# Patient Record
Sex: Female | Born: 1940 | Race: White | Hispanic: No | Marital: Married | Smoking: Former smoker
Health system: Southern US, Community
[De-identification: ages and names within clinical notes are randomized; demographics above are authoritative.]

## PROBLEM LIST (undated history)

## (undated) DIAGNOSIS — E785 Hyperlipidemia, unspecified: Secondary | ICD-10-CM

## (undated) DIAGNOSIS — K219 Gastro-esophageal reflux disease without esophagitis: Secondary | ICD-10-CM

## (undated) DIAGNOSIS — C541 Malignant neoplasm of endometrium: Secondary | ICD-10-CM

## (undated) DIAGNOSIS — J849 Interstitial pulmonary disease, unspecified: Secondary | ICD-10-CM

## (undated) DIAGNOSIS — G629 Polyneuropathy, unspecified: Secondary | ICD-10-CM

## (undated) DIAGNOSIS — R42 Dizziness and giddiness: Secondary | ICD-10-CM

## (undated) DIAGNOSIS — I499 Cardiac arrhythmia, unspecified: Secondary | ICD-10-CM

## (undated) DIAGNOSIS — I10 Essential (primary) hypertension: Secondary | ICD-10-CM

## (undated) DIAGNOSIS — G4730 Sleep apnea, unspecified: Secondary | ICD-10-CM

## (undated) DIAGNOSIS — F329 Major depressive disorder, single episode, unspecified: Secondary | ICD-10-CM

## (undated) DIAGNOSIS — F32A Depression, unspecified: Secondary | ICD-10-CM

## (undated) DIAGNOSIS — N183 Chronic kidney disease, stage 3 (moderate): Secondary | ICD-10-CM

## (undated) DIAGNOSIS — E7800 Pure hypercholesterolemia, unspecified: Secondary | ICD-10-CM

## (undated) DIAGNOSIS — M1990 Unspecified osteoarthritis, unspecified site: Secondary | ICD-10-CM

## (undated) DIAGNOSIS — K429 Umbilical hernia without obstruction or gangrene: Secondary | ICD-10-CM

## (undated) DIAGNOSIS — W19XXXA Unspecified fall, initial encounter: Secondary | ICD-10-CM

## (undated) DIAGNOSIS — IMO0001 Reserved for inherently not codable concepts without codable children: Secondary | ICD-10-CM

## (undated) HISTORY — DX: Unspecified fall, initial encounter: W19.XXXA

## (undated) HISTORY — PX: HERNIA REPAIR: SHX51

## (undated) HISTORY — DX: Hyperlipidemia, unspecified: E78.5

## (undated) HISTORY — PX: HAMMER TOE SURGERY: SHX385

## (undated) HISTORY — DX: Gastro-esophageal reflux disease without esophagitis: K21.9

## (undated) HISTORY — DX: Chronic kidney disease, stage 3 (moderate): N18.3

## (undated) HISTORY — PX: KNEE ARTHROSCOPY: SUR90

## (undated) HISTORY — PX: ABDOMINAL HYSTERECTOMY: SHX81

## (undated) HISTORY — PX: TONSILLECTOMY: SUR1361

## (undated) HISTORY — PX: CHOLECYSTECTOMY: SHX55

## (undated) HISTORY — DX: Malignant neoplasm of endometrium: C54.1

---

## 1988-06-28 DIAGNOSIS — Z8542 Personal history of malignant neoplasm of other parts of uterus: Secondary | ICD-10-CM | POA: Insufficient documentation

## 1996-06-28 DIAGNOSIS — Z87891 Personal history of nicotine dependence: Secondary | ICD-10-CM | POA: Insufficient documentation

## 1999-06-29 DIAGNOSIS — Z7901 Long term (current) use of anticoagulants: Secondary | ICD-10-CM | POA: Insufficient documentation

## 1999-06-29 DIAGNOSIS — I5022 Chronic systolic (congestive) heart failure: Secondary | ICD-10-CM | POA: Insufficient documentation

## 1999-06-29 DIAGNOSIS — I130 Hypertensive heart and chronic kidney disease with heart failure and stage 1 through stage 4 chronic kidney disease, or unspecified chronic kidney disease: Secondary | ICD-10-CM | POA: Insufficient documentation

## 1999-06-29 DIAGNOSIS — K429 Umbilical hernia without obstruction or gangrene: Secondary | ICD-10-CM | POA: Insufficient documentation

## 1999-06-29 DIAGNOSIS — R7303 Prediabetes: Secondary | ICD-10-CM | POA: Insufficient documentation

## 1999-06-29 DIAGNOSIS — G63 Polyneuropathy in diseases classified elsewhere: Secondary | ICD-10-CM | POA: Insufficient documentation

## 1999-06-29 DIAGNOSIS — Z6839 Body mass index (BMI) 39.0-39.9, adult: Secondary | ICD-10-CM | POA: Insufficient documentation

## 1999-06-29 DIAGNOSIS — E7800 Pure hypercholesterolemia, unspecified: Secondary | ICD-10-CM | POA: Insufficient documentation

## 1999-06-29 DIAGNOSIS — Z9981 Dependence on supplemental oxygen: Secondary | ICD-10-CM | POA: Insufficient documentation

## 2000-05-30 ENCOUNTER — Other Ambulatory Visit: Admission: RE | Admit: 2000-05-30 | Discharge: 2000-05-30 | Payer: Self-pay | Admitting: Obstetrics and Gynecology

## 2000-06-09 ENCOUNTER — Encounter: Admission: RE | Admit: 2000-06-09 | Discharge: 2000-06-09 | Payer: Self-pay | Admitting: Obstetrics and Gynecology

## 2000-06-09 ENCOUNTER — Encounter: Payer: Self-pay | Admitting: Obstetrics and Gynecology

## 2001-06-14 ENCOUNTER — Other Ambulatory Visit: Admission: RE | Admit: 2001-06-14 | Discharge: 2001-06-14 | Payer: Self-pay | Admitting: Obstetrics and Gynecology

## 2002-12-28 ENCOUNTER — Encounter: Payer: Self-pay | Admitting: Surgery

## 2002-12-28 ENCOUNTER — Encounter (INDEPENDENT_AMBULATORY_CARE_PROVIDER_SITE_OTHER): Payer: Self-pay | Admitting: Specialist

## 2002-12-28 ENCOUNTER — Inpatient Hospital Stay (HOSPITAL_COMMUNITY): Admission: EM | Admit: 2002-12-28 | Discharge: 2002-12-29 | Payer: Self-pay | Admitting: Emergency Medicine

## 2002-12-28 ENCOUNTER — Encounter: Payer: Self-pay | Admitting: Anesthesiology

## 2004-10-05 ENCOUNTER — Emergency Department (HOSPITAL_COMMUNITY): Admission: EM | Admit: 2004-10-05 | Discharge: 2004-10-06 | Payer: Self-pay | Admitting: Emergency Medicine

## 2004-10-12 ENCOUNTER — Encounter: Admission: RE | Admit: 2004-10-12 | Discharge: 2004-10-12 | Payer: Self-pay | Admitting: Obstetrics and Gynecology

## 2004-10-16 ENCOUNTER — Encounter: Admission: RE | Admit: 2004-10-16 | Discharge: 2004-10-16 | Payer: Self-pay | Admitting: Obstetrics and Gynecology

## 2004-10-16 ENCOUNTER — Encounter (INDEPENDENT_AMBULATORY_CARE_PROVIDER_SITE_OTHER): Payer: Self-pay | Admitting: *Deleted

## 2005-07-25 ENCOUNTER — Encounter: Admission: RE | Admit: 2005-07-25 | Discharge: 2005-07-25 | Payer: Self-pay | Admitting: Family Medicine

## 2007-01-26 ENCOUNTER — Encounter: Admission: RE | Admit: 2007-01-26 | Discharge: 2007-01-26 | Payer: Self-pay | Admitting: Family Medicine

## 2007-02-28 ENCOUNTER — Encounter: Admission: RE | Admit: 2007-02-28 | Discharge: 2007-02-28 | Payer: Self-pay | Admitting: Orthopedic Surgery

## 2007-06-01 ENCOUNTER — Ambulatory Visit (HOSPITAL_BASED_OUTPATIENT_CLINIC_OR_DEPARTMENT_OTHER): Admission: RE | Admit: 2007-06-01 | Discharge: 2007-06-01 | Payer: Self-pay | Admitting: Orthopedic Surgery

## 2008-01-29 ENCOUNTER — Encounter: Admission: RE | Admit: 2008-01-29 | Discharge: 2008-01-29 | Payer: Self-pay | Admitting: Family Medicine

## 2008-05-13 ENCOUNTER — Emergency Department (HOSPITAL_COMMUNITY): Admission: EM | Admit: 2008-05-13 | Discharge: 2008-05-13 | Payer: Self-pay | Admitting: Emergency Medicine

## 2008-05-30 ENCOUNTER — Ambulatory Visit: Payer: Self-pay | Admitting: Cardiovascular Disease

## 2008-06-06 ENCOUNTER — Ambulatory Visit: Payer: Self-pay

## 2008-10-28 ENCOUNTER — Encounter: Payer: Self-pay | Admitting: Orthopedic Surgery

## 2008-11-26 ENCOUNTER — Encounter: Payer: Self-pay | Admitting: Orthopedic Surgery

## 2008-12-26 ENCOUNTER — Encounter: Payer: Self-pay | Admitting: Orthopedic Surgery

## 2009-01-26 ENCOUNTER — Encounter: Payer: Self-pay | Admitting: Orthopedic Surgery

## 2010-11-10 NOTE — Assessment & Plan Note (Signed)
[facility]                            CARDIOLOGY OFFICE NOTE   NAME:ELLISAanshi, Batchelder                           MRN:          [id number]  DATE:05/30/2008                            DOB:          [birth year]    A 70 year old patient referred by Dr. Dorothe Pea in the [facility] ER for  chest pain.  The patient went to the ER on May 13, 2008, for chest  pain.  The pain is atypical.  It was nonexertional, it was sharp, and it  was in her chest radiated to her neck and arms.  She had it for about 10  days.  It is slowly subsiding.  She was ruled out for myocardial  infarction, had no high-risk features, and was sent home.  Talking to  the patient, her coronary risk factors include hypertension and  hypercholesterolemia.  Family history with mother having congestive  heart failure.   The patient's activities are limited.  She has had multiple surgeries on  both knees by Dr. Simonne Come and does not get around well.  She seems to  think her pain is improving slightly; however, she continues to have  some around the shoulder blades and some that radiated up towards the  left neck.  They are not associated with diaphoresis or palpitations.  She does get some dizziness; however, this sounds more like an inner ear  problem, it particularly happens at night when she lays her head down.  There is a ringing in the right ear as well.   REVIEW OF SYSTEMS:  Otherwise, negative.   PAST MEDICAL HISTORY:  Remarkable for hypertension and  hypercholesterolemia.  She has had some chest pain and had a stress test  about 30 years ago.   She is status post cholecystectomy, status post bilateral knee  surgeries, hernia repair x2, tonsillectomy, and has had some surgery on  her toes.  She has significant arthritis.   The patient is happily married.  She has 3 children.  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  She is fairly sedentary and  likes to be on the  Internet quite a bit.  She quit smoking 20 or 25  years ago.  She does not drink alcohol.   FAMILY HISTORY:  Remarkable for mother dying at age 66 with congestive  heart failure.  Father died at age 25 of old age.   CURRENT MEDICATION:  1. Crestor 10 mg daily.  2. Lasix 80 a day.  3. Amlodipine 10 a day.  4. Diovan 320 a day.  5. Meloxicam 15 a day.   PHYSICAL EXAMINATION:  GENERAL:  Remarkable for an obese white female in  no distress.  Pulses are 81 and regular, blood pressure is 130/80, and  weight is 255.  HEENT:  Unremarkable.  NECK:  Carotids are normal without bruit.  No lymphadenopathy,  thyromegaly, or JVP elevation.  LUNGS:  Clear, good diaphragmatic motion.  No wheezing.  HEART:  S1 and S2 with distant heart sounds.  PMI not palpable.  ABDOMEN:  Benign.  Bowel  sounds positive.  No AAA.  No tenderness.  No  bruit.  No hepatosplenomegaly.  No hepatojugular reflux.  EXTREMITIES:  Distal pulses are intact with +1 to 2 edema bilaterally.  NEURO:  Nonfocal.  SKIN:  Warm and dry.  MUSCULOSKELETAL:  No muscular weakness.   EKG shows sinus rhythm and is otherwise normal with slightly poor R-wave  progression.   IMPRESSION:  1. Chest pain, multiple coronary artery risk factors, poor R-wave      progression and ECG.  Followup adenosine Myoview.  2. Hypertension, currently well controlled.  Continue low-sodium diet      and Diovan, as well as amlodipine.  3. Lower extremity edema.  Likely due to dependent edema and venous      problems given her central obesity.  Continue current dose of      Lasix.  Avoid salt.  Elevate legs at the end of the day as best as      she can.  4. Bilateral arthritis.  Followup with Dr. Simonne Come.  Continue      meloxicam as an antiinflammatory.  5. Hypercholesterolemia.  No known coronary artery disease.  Target      LDL currently would be 130 or less.  Continue Crestor 10 mg a day.      No side effects.  Further recommendation would be based  on the      results of her adenosine Myoview.  As long as it is nonischemic,      she will be seen on a p.r.n. basis.     Noralyn Pick. Eden Emms, MD, [facility]  Electronically Signed    PCN/MedQ  DD: 05/30/2008  DT: 05/31/2008  Job #: [id number]   cc:   Jethro Bastos, M.D.

## 2010-11-10 NOTE — Op Note (Signed)
NAMECARLEN, REBUCK                  ACCOUNT NO.:  [id number]   MEDICAL RECORD NO.:  [id number]          PATIENT TYPE:  AMB   LOCATION:  [facility]                         FACILITY:  [facility]   PHYSICIAN:  Marlowe Kays, M.D.  DATE OF BIRTH:  02-04-1941   DATE OF PROCEDURE:  06/01/2007  DATE OF DISCHARGE:                               OPERATIVE REPORT   PREOPERATIVE DIAGNOSES:  1. Torn medial and lateral menisci.  2. Osteoarthritis.  3. Lateral patellar subluxation.   POSTOPERATIVE DIAGNOSES:  1. Torn medial and lateral menisci.  2. Osteoarthritis.  3. Lateral patellar subluxation.   OPERATION:  Left knee arthroscopy with the assistance of C-arm with  partial medial AND lateral meniscectomy and lateral retinacular release.   SURGEON:  Marlowe Kays, M.D.   ASSISTANT:  Nurse.   ANESTHESIA:  General.   PATHOLOGY AND JUSTIFICATION FOR PROCEDURE:  She is 5 feet tall, 250  pounds, obscuring anatomical landmarks.  She was advised preoperatively  that the surgery was performed for the torn menisci and the patellar  maltracking and could not be considered to appreciably improve her  arthritic changes.   PROCEDURE:  Satisfactory general anesthesia, pneumatic tourniquet  applied and left leg Esmarched out non sterilely and inflated to 375  mmHg.  Thigh stabilizer applied over the tourniquet.  Left leg was  prepped with DuraPrep, draped in sterile field, right leg was placed on  the knee support.  I marked out the anatomy of the patella and the joint  lines as best I could using spinal needles to indicate what I thought  was the joint lines.  On trying to enter the joint, however, I was  unable to do so.  To obtain visualization I had to bring in the C-arm to  locate placement of the camera and the 3.5 shaver.  Working first on the  lateral side I was able to localize the joint and using the shaver clean  up to the torn anterior portion of the meniscus and also shaved the  remainder of  the lateral meniscus.  Joint surfaces were badly worn and I  did shave the lateral femoral condyle.  Again using the C-arm I reversed  portals and was able to perform basically the same procedure in the  medial joint with visualization achieved by using the shaver and then  shaving the medial meniscus back to stable rim it was torn throughout  most of its inner rim and then also shaving the medial femoral condyle.  I was unable to get my scope out the medial gutter to look laterally and  had to place it through a superior medial portal and then with  difficulty and using the C-arm once again was able to use a blunt  obturator for the scope placing it  lateral and beneath the patella and  then finding the tract and placing a 90 degree ArthroCare vaporizer  through this tract and then releasing the lateral retinaculum as  completely as possible removing all lateral retinacular tissue  visualized.  I then closed the three portals with 4-0  nylon, injected  the knee joint with 20 mL 0.5% Marcaine with adrenaline.  Tourniquet was released.  Betadine Adaptic dry sterile dressing and knee  immobilizer were applied.  She tolerated the procedure well, was taken  to recovery satisfactory condition with no known complications.           ______________________________  Marlowe Kays, M.D.     JA/MEDQ  D:  06/01/2007  T:  06/02/2007  Job:  [id number]

## 2010-11-13 NOTE — Op Note (Signed)
Kathy [name], Kathy [name]                              ACCOUNT NO.:  [id number]   MEDICAL RECORD NO.:  [id number]                   PATIENT TYPE:  INP   LOCATION:  0101                                 FACILITY:  [facility]   PHYSICIAN:  Currie Paris, M.D.           DATE OF BIRTH:  Dec 14, 1940   DATE OF PROCEDURE:  12/28/2002  DATE OF DISCHARGE:                                 OPERATIVE REPORT   PREOPERATIVE DIAGNOSIS:  Incarcerated ventral incisional hernia (recurrent).   POSTOPERATIVE DIAGNOSIS:  Incarcerated ventral incisional hernia  (recurrent).   OPERATION:  Repair of incarcerated recurrent ventral incisional hernia with  mesh.   SURGEON:  Currie Paris, M.D.   ANESTHESIA:  General.   CLINICAL HISTORY:  This patient is a 70 year old who had apparently what was  an epigastric hernia repaired many years ago, had a recurrence and has been  recurrent for many years also but this morning she woke up with severe pain  in it and on exam had a tender hard mass consistent with an incarcerated  recurrent hernia.   DESCRIPTION OF PROCEDURE:  The patient was brought to the operating room.  After satisfactory general endotracheal anesthesia had been obtained (of  note is that she was a difficult intubation and please refer to anesthesia  notes for details), the abdomen was prepped and draped. I utilized the old  epigastric scar, divided through that until I identified the hernia sac and  it was actually protruding at the very bottom end of the scar so I had to  extend the incision inferiorly. The sac initially was freed up from the  subcutaneous tissues down to the fascia. I could not readily reduce the  contents and they looked like omentum. I then opened the sac and excised it  and having done that I was then able to free up the omentum where it was  stuck to the sac and gently return it to the peritoneal cavity. In addition,  there was a tiny distal defect measuring 3-4 cm but in  addition there was a  small 1 cm defect just to the left of this one.   Once the hernia was reduced, I went ahead and used the cautery elevated the  skin and subcutaneous tissues off the fascia for a distance of several  inches completely around the defect so that I could put an onlay of mesh. I  then palpated carefully through the defect to feel the linea alba to make  sure there were no defects and as far as my finger could reach, I could feel  no further defects.   The major defect was closed transversely as that seemed to be the way it  lended itself to closing with some sutures of #0 Prolene leaving the ends  and middle tied but uncut.   I took a 15 x 15 cm piece of atrium mesh and used  three sutures to anchor it  down and then anchored it at the periphery of the fascia pretty much using  the entire length and width of the mesh trimming off a little bit of excess  as needed. This was anchored with mini sutures of #0 Prolene just at the  edge down to the fascia. This lay nicely across all this and I think  reinforced this area nicely. I then took the hernia tacker and added  additional tacks to make sure we had good approximation of the mesh to the  fascia.   Everything appeared to be dry. I irrigated with a little bug juice. I placed  a 19 Blake drain through a separate stab wound and secured it with 3-0  nylon.   I took some 3-0 Vicryl's and tacked the subcutaneous fat down to the fascia  and tried to eliminate the dead space a little bit. I then closed the subcu  with a 2-0 Vicryl and the skin with staples. I injected about 40 mL of 0.25%  plain Marcaine simply into the wound cavity and left it for about 10 minutes  and then charged the JP. The patient tolerated the procedure well. There  were no operative complications. All counts were correct.                                               Currie Paris, M.D.    CJS/MEDQ  D:  12/28/2002  T:  12/28/2002  Job:   [id number]   cc:   Jethro Bastos, M.D.  [address]  [place]  [place] [address]  Fax: [phone]

## 2010-11-13 NOTE — H&P (Signed)
Kathy [name], Kathy [name]                              ACCOUNT NO.:  [id number]   MEDICAL RECORD NO.:  [id number]                   PATIENT TYPE:  INP   LOCATION:  0452                                 FACILITY:  [facility]   PHYSICIAN:  Currie Paris, M.D.           DATE OF BIRTH:  1940/09/07   DATE OF ADMISSION:  12/28/2002  DATE OF DISCHARGE:                                HISTORY & PHYSICAL   CHIEF COMPLAINT:  Abdominal pain.   HISTORY OF PRESENT ILLNESS:  Ms. Puccio is referred to the emergency room for  me to evaluate by Dr. Shaune Pollack because of possible incarcerated abdominal  hernia.   The patient has had a prior, what appears to be an epigastric hernia repair  many years ago, she says by Dr. Derrell Lolling.  She had a recurrence which she has  had for many years which occasionally gives her a little bit of pain.  However, this morning she developed, when she woke up, severe pain in the  area of the hernia, it has become quite firm, and extremely tender.  She has  not been able to find any relief with any particular position.  Since it has  not gotten any better, she went over to Dr. Kevan Ny' office.  She has had no  nausea or vomiting, fever or chills, or diarrhea with pain.  There is  nothing that seems to make it worse or better, it is just a very constant  significant pain.   MEDICATIONS:  1. Lipitor.  2. Effexor.  3. Two blood pressure medications, but she does not remember the names.   ALLERGIES:  None known.   HABITS:  Smokes none, alcohol none.   FAMILY HISTORY:  Several people in her family have had different types of  heart disease.   REVIEW OF SYSTEMS:  HEAD:  No history of headaches or problems.  EYES:  Negative.  EARS:  Negative.  THROAT:  Negative.  NECK:  No cough, shortness  of breath.  HEART:  No history of murmurs, but does have a history of  hypertension, never had any angina, chest pain, congestive failure.  ABDOMEN:  She has had several abdominal procedures,  including laparotomy,  cholecystectomy, and hysterectomy.  GENITOURINARY:  She is status post  hysterectomy for endometrial cancer seven years ago, has not had any  evidence of recurrences.  She has no current pelvic symptoms.  No urinary  symptoms.  EXTREMITIES:  Negative.   PHYSICAL EXAMINATION:  GENERAL:  The patient is an overweight, but generally  healthy-appearing female.  Oriented x3.  VITAL SIGNS:  Temperature 97, blood pressure 177/81, pulse 78, respirations  20.  HEENT:  Head is normocephalic.  Eyes:  Nonicteric.  Pupils are equal, round,  and regular.  NECK:  Supple, no masses or thyromegaly.  LUNGS:  Clear to auscultation.  No respiratory distress.  HEART:  Regular rhythm.  No  murmurs, rubs, or gallops.  Carotid and femoral  pulses are intact.  ABDOMEN:  Obese, well-healed epigastric scar, well-healed lower abdominal  scar just below the umbilicus and the symphysis, and a well-healed right  upper quadrant scar.  The abdomen is tender specifically around the  epigastric incision, and there is a exquisitely tender firm mass present  consistent with incarcerated hernia.  The rest of the abdomen remains fairly  soft, although she is a little bit tender below the hernia as well.  Bowel  sounds are normal.  I do not appreciate any lower abdominal or right upper  quadrant hernias.  No organomegaly is appreciable.  PELVIC:  Not done.  RECTAL:  Not done.  EXTREMITIES:  No cyanosis or edema.   LABORATORY DATA:  Pending at the time of this dictation, will be reviewed.   IMPRESSION:  1. Incarcerated umbilical hernia.  2. History of hypothyroidism.  3. History of hypercholesterolemia.  4. History of hypertension.  5. History of cancer of the uterus.   PLAN:  We will go ahead and obtain routine studies to make sure she does not  have any underlying cardiac disease besides her hypertension by doing an  EKG, likewise check chest x-ray, get some laboratories, and make sure she   does not have a significant hemoglobin abnormality or white cell count  elevation, and that electrolytes are appropriate, and then take her to the  operating room for repair of her apparent incarcerated ventral hernia.   I discussed the surgery with the patient, the fact that we may need to use  mesh, the fact that if there is bowel in there that we may have to remove or  resect part of it, and that I was concerned about too much delay because I  was afraid that she may develop some strangulation with necrosis, which I do  not think she has at this point in time.  She also understands that these  could recur again, and that we will attempt to use mesh.                                               Currie Paris, M.D.    CJS/MEDQ  D:  12/28/2002  T:  12/29/2002  Job:  [id number]

## 2011-03-31 LAB — POCT I-STAT, CHEM 8
Chloride: 102
Creatinine, Ser: 0.9
HCT: 38
Potassium: 3.8
TCO2: 28

## 2011-03-31 LAB — POCT CARDIAC MARKERS
CKMB, poc: 1.1
Troponin i, poc: <0.05
Troponin i, poc: <0.05

## 2011-03-31 LAB — DIFFERENTIAL
Eosinophils Absolute: 0.1
Monocytes Absolute: 0.6
Neutro Abs: 5.5

## 2011-03-31 LAB — CBC
HCT: 38.7
MCV: 92.1
Platelets: 279
RDW: 13

## 2011-04-05 LAB — BASIC METABOLIC PANEL
CO2: 28
Calcium: 9.4
Chloride: 101
GFR calc Af Amer: >60
Potassium: 3.8
Sodium: 136

## 2011-04-05 LAB — POCT HEMOGLOBIN-HEMACUE: Hemoglobin: 13.2

## 2011-11-02 DIAGNOSIS — R609 Edema, unspecified: Secondary | ICD-10-CM | POA: Diagnosis not present

## 2011-11-02 DIAGNOSIS — R7309 Other abnormal glucose: Secondary | ICD-10-CM | POA: Diagnosis not present

## 2011-11-02 DIAGNOSIS — Z79899 Other long term (current) drug therapy: Secondary | ICD-10-CM | POA: Diagnosis not present

## 2011-11-02 DIAGNOSIS — F329 Major depressive disorder, single episode, unspecified: Secondary | ICD-10-CM | POA: Diagnosis not present

## 2011-11-02 DIAGNOSIS — I10 Essential (primary) hypertension: Secondary | ICD-10-CM | POA: Diagnosis not present

## 2011-11-02 DIAGNOSIS — E7800 Pure hypercholesterolemia, unspecified: Secondary | ICD-10-CM | POA: Diagnosis not present

## 2012-05-12 DIAGNOSIS — F329 Major depressive disorder, single episode, unspecified: Secondary | ICD-10-CM | POA: Diagnosis not present

## 2012-05-12 DIAGNOSIS — E7800 Pure hypercholesterolemia, unspecified: Secondary | ICD-10-CM | POA: Diagnosis not present

## 2012-05-12 DIAGNOSIS — R7309 Other abnormal glucose: Secondary | ICD-10-CM | POA: Diagnosis not present

## 2012-05-12 DIAGNOSIS — I10 Essential (primary) hypertension: Secondary | ICD-10-CM | POA: Diagnosis not present

## 2012-05-12 DIAGNOSIS — R609 Edema, unspecified: Secondary | ICD-10-CM | POA: Diagnosis not present

## 2012-06-02 DIAGNOSIS — I10 Essential (primary) hypertension: Secondary | ICD-10-CM | POA: Diagnosis not present

## 2012-09-13 DIAGNOSIS — H9319 Tinnitus, unspecified ear: Secondary | ICD-10-CM | POA: Diagnosis not present

## 2012-09-13 DIAGNOSIS — H6120 Impacted cerumen, unspecified ear: Secondary | ICD-10-CM | POA: Diagnosis not present

## 2012-09-25 DIAGNOSIS — H2510 Age-related nuclear cataract, unspecified eye: Secondary | ICD-10-CM | POA: Diagnosis not present

## 2012-10-05 ENCOUNTER — Other Ambulatory Visit: Payer: Self-pay | Admitting: Family Medicine

## 2012-10-05 DIAGNOSIS — E7800 Pure hypercholesterolemia, unspecified: Secondary | ICD-10-CM | POA: Diagnosis not present

## 2012-10-05 DIAGNOSIS — Z9181 History of falling: Secondary | ICD-10-CM | POA: Diagnosis not present

## 2012-10-05 DIAGNOSIS — Z1331 Encounter for screening for depression: Secondary | ICD-10-CM | POA: Diagnosis not present

## 2012-10-05 DIAGNOSIS — R7309 Other abnormal glucose: Secondary | ICD-10-CM | POA: Diagnosis not present

## 2012-10-05 DIAGNOSIS — Z6841 Body Mass Index (BMI) 40.0 and over, adult: Secondary | ICD-10-CM | POA: Diagnosis not present

## 2012-10-05 DIAGNOSIS — Z1231 Encounter for screening mammogram for malignant neoplasm of breast: Secondary | ICD-10-CM

## 2012-10-05 DIAGNOSIS — E2839 Other primary ovarian failure: Secondary | ICD-10-CM

## 2012-10-05 DIAGNOSIS — I10 Essential (primary) hypertension: Secondary | ICD-10-CM | POA: Diagnosis not present

## 2012-11-14 ENCOUNTER — Ambulatory Visit
Admission: RE | Admit: 2012-11-14 | Discharge: 2012-11-14 | Disposition: A | Discharge status: Home / Self Care | Payer: Medicare Other | Source: Ambulatory Visit | Attending: Family Medicine | Admitting: Family Medicine

## 2012-11-14 ENCOUNTER — Other Ambulatory Visit: Payer: Self-pay | Admitting: Family Medicine

## 2012-11-14 DIAGNOSIS — Z1231 Encounter for screening mammogram for malignant neoplasm of breast: Secondary | ICD-10-CM

## 2012-11-14 DIAGNOSIS — E2839 Other primary ovarian failure: Secondary | ICD-10-CM

## 2012-11-14 DIAGNOSIS — Z780 Asymptomatic menopausal state: Secondary | ICD-10-CM | POA: Diagnosis not present

## 2013-04-12 DIAGNOSIS — F329 Major depressive disorder, single episode, unspecified: Secondary | ICD-10-CM | POA: Diagnosis not present

## 2013-04-12 DIAGNOSIS — I10 Essential (primary) hypertension: Secondary | ICD-10-CM | POA: Diagnosis not present

## 2013-04-12 DIAGNOSIS — R7309 Other abnormal glucose: Secondary | ICD-10-CM | POA: Diagnosis not present

## 2013-04-12 DIAGNOSIS — E7800 Pure hypercholesterolemia, unspecified: Secondary | ICD-10-CM | POA: Diagnosis not present

## 2013-04-12 DIAGNOSIS — R609 Edema, unspecified: Secondary | ICD-10-CM | POA: Diagnosis not present

## 2013-08-20 DIAGNOSIS — M1710 Unilateral primary osteoarthritis, unspecified knee: Secondary | ICD-10-CM | POA: Diagnosis not present

## 2013-09-21 DIAGNOSIS — M1710 Unilateral primary osteoarthritis, unspecified knee: Secondary | ICD-10-CM | POA: Diagnosis not present

## 2013-10-01 DIAGNOSIS — M1710 Unilateral primary osteoarthritis, unspecified knee: Secondary | ICD-10-CM | POA: Diagnosis not present

## 2013-10-08 DIAGNOSIS — M1710 Unilateral primary osteoarthritis, unspecified knee: Secondary | ICD-10-CM | POA: Diagnosis not present

## 2013-10-15 ENCOUNTER — Other Ambulatory Visit: Payer: Self-pay | Admitting: Family Medicine

## 2013-10-15 DIAGNOSIS — R7309 Other abnormal glucose: Secondary | ICD-10-CM | POA: Diagnosis not present

## 2013-10-15 DIAGNOSIS — Z1231 Encounter for screening mammogram for malignant neoplasm of breast: Secondary | ICD-10-CM

## 2013-10-15 DIAGNOSIS — E669 Obesity, unspecified: Secondary | ICD-10-CM | POA: Diagnosis not present

## 2013-10-15 DIAGNOSIS — R609 Edema, unspecified: Secondary | ICD-10-CM | POA: Diagnosis not present

## 2013-10-15 DIAGNOSIS — E7800 Pure hypercholesterolemia, unspecified: Secondary | ICD-10-CM | POA: Diagnosis not present

## 2013-10-15 DIAGNOSIS — F3289 Other specified depressive episodes: Secondary | ICD-10-CM | POA: Diagnosis not present

## 2013-10-15 DIAGNOSIS — Z9181 History of falling: Secondary | ICD-10-CM | POA: Diagnosis not present

## 2013-10-15 DIAGNOSIS — I10 Essential (primary) hypertension: Secondary | ICD-10-CM | POA: Diagnosis not present

## 2013-10-15 DIAGNOSIS — F329 Major depressive disorder, single episode, unspecified: Secondary | ICD-10-CM | POA: Diagnosis not present

## 2013-10-15 DIAGNOSIS — Z1331 Encounter for screening for depression: Secondary | ICD-10-CM | POA: Diagnosis not present

## 2013-11-16 ENCOUNTER — Ambulatory Visit
Admission: RE | Admit: 2013-11-16 | Discharge: 2013-11-16 | Disposition: A | Discharge status: Home / Self Care | Payer: PRIVATE HEALTH INSURANCE | Source: Ambulatory Visit | Attending: Family Medicine | Admitting: Family Medicine

## 2013-11-16 ENCOUNTER — Encounter (INDEPENDENT_AMBULATORY_CARE_PROVIDER_SITE_OTHER): Payer: Self-pay

## 2013-11-16 DIAGNOSIS — Z1231 Encounter for screening mammogram for malignant neoplasm of breast: Secondary | ICD-10-CM | POA: Diagnosis not present

## 2013-11-20 DIAGNOSIS — M1710 Unilateral primary osteoarthritis, unspecified knee: Secondary | ICD-10-CM | POA: Diagnosis not present

## 2014-04-15 DIAGNOSIS — E780 Pure hypercholesterolemia: Secondary | ICD-10-CM | POA: Diagnosis not present

## 2014-04-15 DIAGNOSIS — R202 Paresthesia of skin: Secondary | ICD-10-CM | POA: Diagnosis not present

## 2014-04-15 DIAGNOSIS — R7309 Other abnormal glucose: Secondary | ICD-10-CM | POA: Diagnosis not present

## 2014-04-16 DIAGNOSIS — I10 Essential (primary) hypertension: Secondary | ICD-10-CM | POA: Diagnosis not present

## 2014-04-16 DIAGNOSIS — F329 Major depressive disorder, single episode, unspecified: Secondary | ICD-10-CM | POA: Diagnosis not present

## 2014-04-16 DIAGNOSIS — R600 Localized edema: Secondary | ICD-10-CM | POA: Diagnosis not present

## 2014-04-16 DIAGNOSIS — R7309 Other abnormal glucose: Secondary | ICD-10-CM | POA: Diagnosis not present

## 2014-05-28 DIAGNOSIS — M170 Bilateral primary osteoarthritis of knee: Secondary | ICD-10-CM | POA: Diagnosis not present

## 2014-06-04 DIAGNOSIS — M1711 Unilateral primary osteoarthritis, right knee: Secondary | ICD-10-CM | POA: Diagnosis not present

## 2014-06-04 DIAGNOSIS — M1712 Unilateral primary osteoarthritis, left knee: Secondary | ICD-10-CM | POA: Diagnosis not present

## 2014-06-11 DIAGNOSIS — M1711 Unilateral primary osteoarthritis, right knee: Secondary | ICD-10-CM | POA: Diagnosis not present

## 2014-06-11 DIAGNOSIS — M1712 Unilateral primary osteoarthritis, left knee: Secondary | ICD-10-CM | POA: Diagnosis not present

## 2014-07-23 DIAGNOSIS — H2513 Age-related nuclear cataract, bilateral: Secondary | ICD-10-CM | POA: Diagnosis not present

## 2014-07-29 DIAGNOSIS — H2513 Age-related nuclear cataract, bilateral: Secondary | ICD-10-CM | POA: Diagnosis not present

## 2014-07-31 ENCOUNTER — Emergency Department (HOSPITAL_COMMUNITY)
Admission: EM | Admit: 2014-07-31 | Discharge: 2014-08-01 | Disposition: A | Discharge status: Home / Self Care | Payer: Medicare Other | Attending: Emergency Medicine | Admitting: Emergency Medicine

## 2014-07-31 ENCOUNTER — Encounter (HOSPITAL_COMMUNITY): Payer: Self-pay | Admitting: Emergency Medicine

## 2014-07-31 ENCOUNTER — Emergency Department (HOSPITAL_COMMUNITY): Payer: Medicare Other

## 2014-07-31 DIAGNOSIS — R109 Unspecified abdominal pain: Secondary | ICD-10-CM | POA: Diagnosis present

## 2014-07-31 DIAGNOSIS — Z87891 Personal history of nicotine dependence: Secondary | ICD-10-CM | POA: Diagnosis not present

## 2014-07-31 DIAGNOSIS — Z90710 Acquired absence of both cervix and uterus: Secondary | ICD-10-CM | POA: Diagnosis not present

## 2014-07-31 DIAGNOSIS — R1110 Vomiting, unspecified: Secondary | ICD-10-CM

## 2014-07-31 DIAGNOSIS — R197 Diarrhea, unspecified: Secondary | ICD-10-CM | POA: Diagnosis not present

## 2014-07-31 DIAGNOSIS — Z9049 Acquired absence of other specified parts of digestive tract: Secondary | ICD-10-CM | POA: Diagnosis not present

## 2014-07-31 DIAGNOSIS — K529 Noninfective gastroenteritis and colitis, unspecified: Secondary | ICD-10-CM | POA: Insufficient documentation

## 2014-07-31 DIAGNOSIS — Z8719 Personal history of other diseases of the digestive system: Secondary | ICD-10-CM | POA: Insufficient documentation

## 2014-07-31 DIAGNOSIS — I10 Essential (primary) hypertension: Secondary | ICD-10-CM | POA: Insufficient documentation

## 2014-07-31 DIAGNOSIS — N281 Cyst of kidney, acquired: Secondary | ICD-10-CM | POA: Diagnosis not present

## 2014-07-31 DIAGNOSIS — R52 Pain, unspecified: Secondary | ICD-10-CM

## 2014-07-31 DIAGNOSIS — K5730 Diverticulosis of large intestine without perforation or abscess without bleeding: Secondary | ICD-10-CM | POA: Diagnosis not present

## 2014-07-31 DIAGNOSIS — R1013 Epigastric pain: Secondary | ICD-10-CM | POA: Diagnosis not present

## 2014-07-31 DIAGNOSIS — J9809 Other diseases of bronchus, not elsewhere classified: Secondary | ICD-10-CM | POA: Diagnosis not present

## 2014-07-31 HISTORY — DX: Umbilical hernia without obstruction or gangrene: K42.9

## 2014-07-31 HISTORY — DX: Unspecified osteoarthritis, unspecified site: M19.90

## 2014-07-31 HISTORY — DX: Essential (primary) hypertension: I10

## 2014-07-31 LAB — CBC WITH DIFFERENTIAL/PLATELET
BASOS ABS: 0.1 10*3/uL (ref 0.0–0.1)
BASOS PCT: 1 % (ref 0–1)
EOS ABS: 0 10*3/uL (ref 0.0–0.7)
EOS PCT: 0 % (ref 0–5)
HEMATOCRIT: 43 % (ref 36.0–46.0)
HEMOGLOBIN: 14.5 g/dL (ref 12.0–15.0)
LYMPHS ABS: 1.3 10*3/uL (ref 0.7–4.0)
LYMPHS PCT: 13 % (ref 12–46)
MCH: 30.7 pg (ref 26.0–34.0)
MCHC: 33.7 g/dL (ref 30.0–36.0)
MCV: 91.1 fL (ref 78.0–100.0)
MONO ABS: 0.4 10*3/uL (ref 0.1–1.0)
Monocytes Relative: 4 % (ref 3–12)
NEUTROS PCT: 82 % — AB (ref 43–77)
Neutro Abs: 8.6 10*3/uL — ABNORMAL HIGH (ref 1.7–7.7)
Platelets: 225 10*3/uL (ref 150–400)
RBC: 4.72 MIL/uL (ref 3.87–5.11)
RDW: 12.7 % (ref 11.5–15.5)
WBC: 10.3 10*3/uL (ref 4.0–10.5)

## 2014-07-31 LAB — I-STAT CHEM 8, ED
BUN: 18 mg/dL (ref 6–23)
Calcium, Ion: 1.17 mmol/L (ref 1.13–1.30)
Chloride: 101 mmol/L (ref 96–112)
Creatinine, Ser: 0.8 mg/dL (ref 0.50–1.10)
Glucose, Bld: 139 mg/dL — ABNORMAL HIGH (ref 70–99)
HCT: 47 % — ABNORMAL HIGH (ref 36.0–46.0)
HEMOGLOBIN: 16 g/dL — AB (ref 12.0–15.0)
Potassium: 4 mmol/L (ref 3.5–5.1)
Sodium: 139 mmol/L (ref 135–145)
TCO2: 25 mmol/L (ref 0–100)

## 2014-07-31 MED ORDER — IOHEXOL 300 MG/ML  SOLN
25.0000 mL | INTRAMUSCULAR | Status: AC
  Administered 2014-07-31: 25 mL via ORAL

## 2014-07-31 NOTE — ED Notes (Signed)
Pt st's she has a umbilical hernia and started having pain in this area this am with nausea, vomiting and diarrhea.

## 2014-07-31 NOTE — ED Notes (Signed)
Pt reports hernia to left upper abdomen of 12 years has been giving her trouble.  Pt reports n/v/d with last bm today.  Pt alert and oriented.

## 2014-07-31 NOTE — ED Provider Notes (Signed)
CSN: [id number]     Arrival date & time 07/31/14  1711 History   First MD Initiated Contact with Patient 07/31/14 1947     Chief Complaint  Patient presents with  . Abdominal Pain     (Consider location/radiation/quality/duration/timing/severity/associated sxs/prior Treatment) Patient is a 74 y.o. female presenting with abdominal pain. The history is provided by the patient.  Abdominal Pain Associated symptoms: diarrhea and vomiting   Associated symptoms: no chest pain, no chills, no cough, no dysuria, no fever, no hematuria, no shortness of breath and no sore throat   pt with hx hysterectomy, cholecystectomy, ventral hernia repair, c/o upper abdominal pain, nvd onset earlier today. States hernia has been giving her similar trouble for 10+ years. States felt it was 'out' earlier. Emesis was consistent with recently ingested food/drink, no bloody or bilious emesis. A few loose to watery stools, not bloody. Pain was cramping, intermittent, without radiation. No fever or chills. No known bad food ingestion, recent abx use, or ill contacts. No dysuria or gu c/o.      Past Medical History  Diagnosis Date  . Umbilical hernia   . Hypertension   . Arthritis    Past Surgical History  Procedure Laterality Date  . Tonsillectomy    . Cholecystectomy    . Abdominal hysterectomy    . Hernia repair     No family history on file. History  Substance Use Topics  . Smoking status: Former Research scientist (life sciences)  . Smokeless tobacco: Not on file  . Alcohol Use: No   OB History    No data available     Review of Systems  Constitutional: Negative for fever and chills.  HENT: Negative for sore throat.   Eyes: Negative for redness.  Respiratory: Negative for cough and shortness of breath.   Cardiovascular: Negative for chest pain.  Gastrointestinal: Positive for vomiting, abdominal pain and diarrhea.  Genitourinary: Negative for dysuria, hematuria and flank pain.  Musculoskeletal: Negative for back pain  and neck pain.  Skin: Negative for rash.  Neurological: Negative for headaches.  Hematological: Does not bruise/bleed easily.  Psychiatric/Behavioral: Negative for confusion.      Allergies  Review of patient's allergies indicates not on file.  Home Medications   Prior to Admission medications   Not on File   BP 145/68 mmHg  Pulse 89  Temp(Src) 98.6 F (37 C) (Oral)  Resp 18  Wt 228 lb (103.42 kg)  SpO2 95% Physical Exam  Constitutional: She appears well-developed and well-nourished. No distress.  HENT:  Mouth/Throat: Oropharynx is clear and moist.  Eyes: Conjunctivae are normal. No scleral icterus.  Neck: Neck supple. No tracheal deviation present.  Cardiovascular: Normal rate, regular rhythm, normal heart sounds and intact distal pulses.   Pulmonary/Chest: Effort normal and breath sounds normal. No respiratory distress.  Abdominal: Soft. Normal appearance and bowel sounds are normal. She exhibits no distension and no mass. There is no tenderness. There is no rebound and no guarding.  No incarc hernia.   Genitourinary:  No cva tenderness  Musculoskeletal: She exhibits no edema.  Neurological: She is alert.  Skin: Skin is warm and dry. No rash noted. She is not diaphoretic.  Psychiatric: She has a normal mood and affect.  Nursing note and vitals reviewed.   ED Course  Procedures (including critical care time) Labs Review  Results for orders placed or performed during the hospital encounter of 07/31/14  CBC with Differential  Result Value Ref Range   WBC 10.3 4.0 - 10.5  K/uL   RBC 4.72 3.87 - 5.11 MIL/uL   Hemoglobin 14.5 12.0 - 15.0 g/dL   HCT 43.0 36.0 - 46.0 %   MCV 91.1 78.0 - 100.0 fL   MCH 30.7 26.0 - 34.0 pg   MCHC 33.7 30.0 - 36.0 g/dL   RDW 12.7 11.5 - 15.5 %   Platelets 225 150 - 400 K/uL   Neutrophils Relative % 82 (H) 43 - 77 %   Neutro Abs 8.6 (H) 1.7 - 7.7 K/uL   Lymphocytes Relative 13 12 - 46 %   Lymphs Abs 1.3 0.7 - 4.0 K/uL   Monocytes  Relative 4 3 - 12 %   Monocytes Absolute 0.4 0.1 - 1.0 K/uL   Eosinophils Relative 0 0 - 5 %   Eosinophils Absolute 0.0 0.0 - 0.7 K/uL   Basophils Relative 1 0 - 1 %   Basophils Absolute 0.1 0.0 - 0.1 K/uL  I-Stat Chem 8, ED  Result Value Ref Range   Sodium 139 135 - 145 mmol/L   Potassium 4.0 3.5 - 5.1 mmol/L   Chloride 101 96 - 112 mmol/L   BUN 18 6 - 23 mg/dL   Creatinine, Ser 0.80 0.50 - 1.10 mg/dL   Glucose, Bld 139 (H) 70 - 99 mg/dL   Calcium, Ion 1.17 1.13 - 1.30 mmol/L   TCO2 25 0 - 100 mmol/L   Hemoglobin 16.0 (H) 12.0 - 15.0 g/dL   HCT 47.0 (H) 36.0 - 46.0 %   Dg Abd Acute W/chest  07/31/2014   CLINICAL DATA:  74 year old female with epigastric pain. Emesis. Diarrhea. Symptoms began this morning.  EXAM: ACUTE ABDOMEN SERIES (ABDOMEN 2 VIEW & CHEST 1 VIEW)  COMPARISON:  Chest x-ray 05/13/2008.  FINDINGS: Chronic elevation of the right hemidiaphragm again noted. No acute consolidative airspace disease. No pleural effusions. Mild diffuse peribronchial cuffing and interstitial prominence. No evidence of pulmonary edema. Heart size is normal. Upper mediastinal contours are within normal limits. Atherosclerosis in the thoracic aorta.  Surgical clips project over the right upper quadrant of the abdomen, likely from prior cholecystectomy. Markers from surgical mesh are also noted throughout the central abdomen, potentially related to prior hernia repair. Some gas and stool is noted in the colon. Multiple air-fluid levels are noted within small bowel on the upright projection. Gas, stool and fluid is noted throughout the colon and rectum. There are some mildly dilated loops of gas-filled small bowel measuring up to 4.1 cm in diameter. The appearance of some small bowel loops also thickening in the wall. Suggests the presence of No pneumoperitoneum.  IMPRESSION: 1. Abnormal bowel gas pattern, as above, suggestive of potential early or partial small bowel obstruction. This could be better evaluated  with contrast-enhanced CT of the abdomen and pelvis if clinically appropriate. 2. No pneumoperitoneum. 3. Postoperative changes, as above. 4. Mild diffuse peribronchial cuffing and interstitial prominence. Similar findings were seen to a lesser extent on remote prior study from 2009, and therefore these findings may be chronic, potentially related to chronic bronchitis. Alternatively, if the patient has acute respiratory symptoms, the possibility of acute bronchitis should be considered.   Electronically Signed   By: Vinnie Langton M.D.   On: 07/31/2014 21:00        MDM   Reviewed nursing notes and prior charts for additional history.   Pt notes that symptoms have improved/resolved since presenting to ED.  Plain xr results noted.   Will get ct.  No recurrent nv.  Pt comfortable  on recheck. Declines pain med.  abd soft. No incarc hernia felt. Pt afeb.  0000 Ct pending - signed out to Dr Lita Mains to check CT when back.  If neg acute, anticipate d/c to home w gen surgery follow up.  If sbo or other acute process, admit.          Mirna Mires, MD 07/31/14 [phone]

## 2014-07-31 NOTE — ED Notes (Signed)
IV team unable to start IV.  CT and MD notified.

## 2014-07-31 NOTE — ED Notes (Signed)
IV team at bedside 

## 2014-08-01 ENCOUNTER — Emergency Department (HOSPITAL_COMMUNITY): Payer: Medicare Other

## 2014-08-01 DIAGNOSIS — N281 Cyst of kidney, acquired: Secondary | ICD-10-CM | POA: Diagnosis not present

## 2014-08-01 DIAGNOSIS — K529 Noninfective gastroenteritis and colitis, unspecified: Secondary | ICD-10-CM | POA: Diagnosis not present

## 2014-08-01 DIAGNOSIS — K5730 Diverticulosis of large intestine without perforation or abscess without bleeding: Secondary | ICD-10-CM | POA: Diagnosis not present

## 2014-08-01 MED ORDER — TRAMADOL HCL 50 MG PO TABS: 50.0000 mg | ORAL_TABLET | Freq: Four times a day (QID) | ORAL | Status: DC | PRN

## 2014-08-01 MED ORDER — METRONIDAZOLE 500 MG PO TABS: 500.0000 mg | ORAL_TABLET | Freq: Two times a day (BID) | ORAL | Status: DC

## 2014-08-01 MED ORDER — CIPROFLOXACIN HCL 500 MG PO TABS: 500.0000 mg | ORAL_TABLET | Freq: Two times a day (BID) | ORAL | Status: DC

## 2014-08-01 MED ORDER — METRONIDAZOLE 500 MG PO TABS
500.0000 mg | ORAL_TABLET | Freq: Once | ORAL | Status: AC
  Administered 2014-08-01: 500 mg via ORAL
  Filled 2014-08-01: qty 1

## 2014-08-01 MED ORDER — CIPROFLOXACIN HCL 500 MG PO TABS
500.0000 mg | ORAL_TABLET | Freq: Once | ORAL | Status: AC
  Administered 2014-08-01: 500 mg via ORAL
  Filled 2014-08-01: qty 1

## 2014-08-01 MED ORDER — ONDANSETRON 4 MG PO TBDP: ORAL_TABLET | ORAL | Status: DC

## 2014-08-01 NOTE — ED Notes (Signed)
Pt made aware to return if symptoms worsen or if any life threatening symptoms occur.   

## 2014-08-01 NOTE — ED Notes (Signed)
Notified CT that pt has finished contrast 

## 2014-08-01 NOTE — Discharge Instructions (Signed)

## 2014-08-01 NOTE — ED Provider Notes (Signed)
Patient states her pain is improved. No further vomiting in the emergency department. Vital signs remained stable. CT without any evidence of obstruction. Patient does have mild inflammatory changes to the sigmoid colon. On reexam, patient does have mild tenderness to palpation in the left lower quadrant. Given first dose of antibiotics in the emergency department. We'll discharge home to follow up with her primary doctor. She's been given strict return precautions and has voiced understanding.  Julianne Rice, MD 08/01/14 [phone]

## 2014-08-01 NOTE — ED Notes (Signed)
Pt taken to ct 

## 2014-08-01 NOTE — ED Notes (Signed)
Pt returned from ct

## 2014-08-05 DIAGNOSIS — A09 Infectious gastroenteritis and colitis, unspecified: Secondary | ICD-10-CM | POA: Diagnosis not present

## 2014-08-07 ENCOUNTER — Ambulatory Visit: Payer: Self-pay | Admitting: Ophthalmology

## 2014-08-07 DIAGNOSIS — G629 Polyneuropathy, unspecified: Secondary | ICD-10-CM | POA: Diagnosis not present

## 2014-08-07 DIAGNOSIS — H2511 Age-related nuclear cataract, right eye: Secondary | ICD-10-CM | POA: Diagnosis not present

## 2014-08-07 DIAGNOSIS — E780 Pure hypercholesterolemia: Secondary | ICD-10-CM | POA: Diagnosis not present

## 2014-08-07 DIAGNOSIS — I10 Essential (primary) hypertension: Secondary | ICD-10-CM | POA: Diagnosis not present

## 2014-08-07 DIAGNOSIS — I499 Cardiac arrhythmia, unspecified: Secondary | ICD-10-CM | POA: Diagnosis not present

## 2014-08-07 DIAGNOSIS — H2512 Age-related nuclear cataract, left eye: Secondary | ICD-10-CM | POA: Diagnosis not present

## 2014-08-08 DIAGNOSIS — H2513 Age-related nuclear cataract, bilateral: Secondary | ICD-10-CM | POA: Diagnosis not present

## 2014-10-18 ENCOUNTER — Other Ambulatory Visit: Payer: Self-pay | Admitting: Family Medicine

## 2014-10-18 DIAGNOSIS — Z1231 Encounter for screening mammogram for malignant neoplasm of breast: Secondary | ICD-10-CM

## 2014-10-18 DIAGNOSIS — R7309 Other abnormal glucose: Secondary | ICD-10-CM | POA: Diagnosis not present

## 2014-10-18 DIAGNOSIS — E2839 Other primary ovarian failure: Secondary | ICD-10-CM | POA: Diagnosis not present

## 2014-10-18 DIAGNOSIS — F329 Major depressive disorder, single episode, unspecified: Secondary | ICD-10-CM | POA: Diagnosis not present

## 2014-10-18 DIAGNOSIS — R600 Localized edema: Secondary | ICD-10-CM | POA: Diagnosis not present

## 2014-10-18 DIAGNOSIS — R739 Hyperglycemia, unspecified: Secondary | ICD-10-CM | POA: Diagnosis not present

## 2014-10-18 DIAGNOSIS — E780 Pure hypercholesterolemia: Secondary | ICD-10-CM | POA: Diagnosis not present

## 2014-10-18 DIAGNOSIS — I10 Essential (primary) hypertension: Secondary | ICD-10-CM | POA: Diagnosis not present

## 2014-10-18 DIAGNOSIS — Z9181 History of falling: Secondary | ICD-10-CM | POA: Diagnosis not present

## 2014-11-26 ENCOUNTER — Ambulatory Visit
Admission: RE | Admit: 2014-11-26 | Discharge: 2014-11-26 | Disposition: A | Discharge status: Home / Self Care | Payer: Medicare Other | Source: Ambulatory Visit | Attending: Family Medicine | Admitting: Family Medicine

## 2014-11-26 DIAGNOSIS — E2839 Other primary ovarian failure: Secondary | ICD-10-CM

## 2014-11-26 DIAGNOSIS — Z1231 Encounter for screening mammogram for malignant neoplasm of breast: Secondary | ICD-10-CM | POA: Diagnosis not present

## 2014-11-26 DIAGNOSIS — Z780 Asymptomatic menopausal state: Secondary | ICD-10-CM | POA: Diagnosis not present

## 2014-11-26 DIAGNOSIS — Z13820 Encounter for screening for osteoporosis: Secondary | ICD-10-CM | POA: Diagnosis not present

## 2014-12-31 DIAGNOSIS — M170 Bilateral primary osteoarthritis of knee: Secondary | ICD-10-CM | POA: Diagnosis not present

## 2015-01-09 DIAGNOSIS — M170 Bilateral primary osteoarthritis of knee: Secondary | ICD-10-CM | POA: Diagnosis not present

## 2015-01-16 DIAGNOSIS — M170 Bilateral primary osteoarthritis of knee: Secondary | ICD-10-CM | POA: Diagnosis not present

## 2015-02-27 DIAGNOSIS — M170 Bilateral primary osteoarthritis of knee: Secondary | ICD-10-CM | POA: Diagnosis not present

## 2015-03-18 DIAGNOSIS — H2511 Age-related nuclear cataract, right eye: Secondary | ICD-10-CM | POA: Diagnosis not present

## 2015-04-21 DIAGNOSIS — R413 Other amnesia: Secondary | ICD-10-CM | POA: Diagnosis not present

## 2015-04-21 DIAGNOSIS — R600 Localized edema: Secondary | ICD-10-CM | POA: Diagnosis not present

## 2015-04-21 DIAGNOSIS — G2581 Restless legs syndrome: Secondary | ICD-10-CM | POA: Diagnosis not present

## 2015-04-21 DIAGNOSIS — Z6841 Body Mass Index (BMI) 40.0 and over, adult: Secondary | ICD-10-CM | POA: Diagnosis not present

## 2015-04-21 DIAGNOSIS — Z139 Encounter for screening, unspecified: Secondary | ICD-10-CM | POA: Diagnosis not present

## 2015-04-21 DIAGNOSIS — E7800 Pure hypercholesterolemia, unspecified: Secondary | ICD-10-CM | POA: Diagnosis not present

## 2015-04-21 DIAGNOSIS — Z9181 History of falling: Secondary | ICD-10-CM | POA: Diagnosis not present

## 2015-04-21 DIAGNOSIS — R7303 Prediabetes: Secondary | ICD-10-CM | POA: Diagnosis not present

## 2015-04-21 DIAGNOSIS — R739 Hyperglycemia, unspecified: Secondary | ICD-10-CM | POA: Diagnosis not present

## 2015-04-21 DIAGNOSIS — F329 Major depressive disorder, single episode, unspecified: Secondary | ICD-10-CM | POA: Diagnosis not present

## 2015-04-21 DIAGNOSIS — I10 Essential (primary) hypertension: Secondary | ICD-10-CM | POA: Diagnosis not present

## 2015-05-08 DIAGNOSIS — H02409 Unspecified ptosis of unspecified eyelid: Secondary | ICD-10-CM | POA: Diagnosis not present

## 2015-05-15 DIAGNOSIS — H02403 Unspecified ptosis of bilateral eyelids: Secondary | ICD-10-CM | POA: Diagnosis not present

## 2015-07-08 ENCOUNTER — Encounter: Payer: Self-pay | Admitting: *Deleted

## 2015-07-14 NOTE — Discharge Instructions (Signed)
INSTRUCTIONS FOLLOWING OCULOPLASTIC SURGERY °AMY M. FOWLER, MD ° °AFTER YOUR EYE SURGERY, THER ARE MANY THINGS THWIHC YOU, THE PATIENT, CAN DO TO ASSURE THE BEST POSSIBLE RESULT FROM YOUR OPERATION.  THIS SHEET SHOULD BE REFERRED TO WHENEVER QUESTIONS ARISE.  IF THERE ARE ANY QUESTIONS NOT ANSWERED HERE, DO NOT HESITATE TO CALL OUR OFFICE AT [phone] OR [phone].  THERE IS ALWAYS [facility] AVAILABLE TO CALL IF QUESTIONS OR PROBLEMS ARISE. ° °VISION: Your vision may be blurred and out of focus after surgery until you are able to stop using your ointment, swelling resolves and your eye(s) heal. This may take 1 to 2 weeks at the least.  If your vision becomes gradually more dim or dark, this is not normal and you need to call our office immediately. ° °EYE CARE: For the first 48 hours after surgery, use ice packs frequently - “20 minutes on, 20 minutes off” - to help reduce swelling and bruising.  Small bags of frozen peas or corn make good ice packs along with cloths soaked in ice water.  If you are wearing a patch or other type of dressing following surgery, keep this on for the amount of time specified by your doctor.  For the first week following surgery, you will need to treat your stitches with great care.  If is OK to shower, but take care to not allow soapy water to run into your eye(s) to help reduce changes of infection.  You may gently clean the eyelashes and around the eye(s) with cotton balls and sterile water, BUT DO NOT RUB THE STITCHES VIGOROUSLY.  Keeping your stitches moist with ointment will help promote healing with minimal scar formation. ° °ACTIVITY: When you leave the surgery center, you should go home, rest and be inactive.  The eye(s) may feel scratchy and keeping the eyes closed will allow for faster healing.  The first week following surgery, avoid straining (anything making the face turn red) or lifting over 20 pounds.  Additionally, avoid bending which causes your head to go below  your waist.  Using your eyes will NOT harm them, so feel free to read, watch television, use the computer, etc as desired.  Driving depends on each individual, so check with your doctor if you have questions about driving. ° °MEDICATIONS:  You will be given a prescription for an ointment to use 4 times a day on your stitches.  You can use the ointment in your eyes if they feel scratchy or irritated.  If you eyelid(s) don’t close completely when you sleep, put some ointment in your eyes before bedtime. ° °EMERGENCY: If you experience SEVERE EYE PAIN OR HEADACHE UNRELIEVED BY TYLENOL OR PERCOCET, NAUSEA OR VOMITING, WORSENING REDNESS, OR WORSENING VISION (ESPECIALLY VISION THAT WA INITIALLY BETTER) CALL [phone] OR [phone] DURING BUSINESS HOURS OR AFTER HOURS. ° °General Anesthesia, Adult, Care After °Refer to this sheet in the next few weeks. These instructions provide you with information on caring for yourself after your procedure. Your health care provider may also give you more specific instructions. Your treatment has been planned according to current medical practices, but problems sometimes occur. Call your health care provider if you have any problems or questions after your procedure. °WHAT TO EXPECT AFTER THE PROCEDURE °After the procedure, it is typical to experience: °· Sleepiness. °· Nausea and vomiting. °HOME CARE INSTRUCTIONS °· For the first 24 hours after general anesthesia: °¨ Have a responsible person with you. °¨ Do not drive a car. If you   are alone, do not take public transportation. °¨ Do not drink alcohol. °¨ Do not take medicine that has not been prescribed by your health care provider. °¨ Do not sign important papers or make important decisions. °¨ You may resume a normal diet and activities as directed by your health care provider. °· Change bandages (dressings) as directed. °· If you have questions or problems that seem related to general anesthesia, call the hospital and ask for  the anesthetist or anesthesiologist on call. °SEEK MEDICAL CARE IF: °· You have nausea and vomiting that continue the day after anesthesia. °· You develop a rash. °SEEK IMMEDIATE MEDICAL CARE IF:  °· You have difficulty breathing. °· You have chest pain. °· You have any allergic problems. °  °This information is not intended to replace advice given to you by your health care provider. Make sure you discuss any questions you have with your health care provider. °  °Document Released: 09/20/2000 Document Revised: 07/05/2014 Document Reviewed: 10/13/2011 °Elsevier Interactive Patient Education ©2016 [employer] ° °

## 2015-07-15 ENCOUNTER — Ambulatory Visit: Payer: Medicare Other | Admitting: Anesthesiology

## 2015-07-15 ENCOUNTER — Ambulatory Visit
Admission: RE | Admit: 2015-07-15 | Discharge: 2015-07-15 | Disposition: A | Discharge status: Home / Self Care | Payer: Medicare Other | Source: Ambulatory Visit | Attending: Ophthalmology | Admitting: Ophthalmology

## 2015-07-15 ENCOUNTER — Encounter
Admission: RE | Disposition: A | Discharge status: Home / Self Care | Payer: Self-pay | Source: Ambulatory Visit | Attending: Ophthalmology

## 2015-07-15 DIAGNOSIS — H02831 Dermatochalasis of right upper eyelid: Secondary | ICD-10-CM | POA: Insufficient documentation

## 2015-07-15 DIAGNOSIS — M25561 Pain in right knee: Secondary | ICD-10-CM | POA: Insufficient documentation

## 2015-07-15 DIAGNOSIS — H02403 Unspecified ptosis of bilateral eyelids: Secondary | ICD-10-CM | POA: Diagnosis not present

## 2015-07-15 DIAGNOSIS — G4730 Sleep apnea, unspecified: Secondary | ICD-10-CM | POA: Insufficient documentation

## 2015-07-15 DIAGNOSIS — G8929 Other chronic pain: Secondary | ICD-10-CM | POA: Diagnosis not present

## 2015-07-15 DIAGNOSIS — Z79899 Other long term (current) drug therapy: Secondary | ICD-10-CM | POA: Diagnosis not present

## 2015-07-15 DIAGNOSIS — E7800 Pure hypercholesterolemia, unspecified: Secondary | ICD-10-CM | POA: Diagnosis not present

## 2015-07-15 DIAGNOSIS — Z90710 Acquired absence of both cervix and uterus: Secondary | ICD-10-CM | POA: Diagnosis not present

## 2015-07-15 DIAGNOSIS — G629 Polyneuropathy, unspecified: Secondary | ICD-10-CM | POA: Diagnosis not present

## 2015-07-15 DIAGNOSIS — K449 Diaphragmatic hernia without obstruction or gangrene: Secondary | ICD-10-CM | POA: Diagnosis not present

## 2015-07-15 DIAGNOSIS — F329 Major depressive disorder, single episode, unspecified: Secondary | ICD-10-CM | POA: Diagnosis not present

## 2015-07-15 DIAGNOSIS — Z9049 Acquired absence of other specified parts of digestive tract: Secondary | ICD-10-CM | POA: Diagnosis not present

## 2015-07-15 DIAGNOSIS — H02834 Dermatochalasis of left upper eyelid: Secondary | ICD-10-CM | POA: Insufficient documentation

## 2015-07-15 DIAGNOSIS — Z791 Long term (current) use of non-steroidal anti-inflammatories (NSAID): Secondary | ICD-10-CM | POA: Diagnosis not present

## 2015-07-15 DIAGNOSIS — I10 Essential (primary) hypertension: Secondary | ICD-10-CM | POA: Diagnosis not present

## 2015-07-15 DIAGNOSIS — Z888 Allergy status to other drugs, medicaments and biological substances status: Secondary | ICD-10-CM | POA: Insufficient documentation

## 2015-07-15 DIAGNOSIS — M25562 Pain in left knee: Secondary | ICD-10-CM | POA: Insufficient documentation

## 2015-07-15 HISTORY — DX: Polyneuropathy, unspecified: G62.9

## 2015-07-15 HISTORY — DX: Reserved for inherently not codable concepts without codable children: IMO0001

## 2015-07-15 HISTORY — DX: Pure hypercholesterolemia, unspecified: E78.00

## 2015-07-15 HISTORY — PX: BROW LIFT: SHX178

## 2015-07-15 HISTORY — DX: Sleep apnea, unspecified: G47.30

## 2015-07-15 HISTORY — DX: Major depressive disorder, single episode, unspecified: F32.9

## 2015-07-15 HISTORY — DX: Cardiac arrhythmia, unspecified: I49.9

## 2015-07-15 HISTORY — DX: Depression, unspecified: F32.A

## 2015-07-15 HISTORY — PX: PTOSIS REPAIR: SHX6568

## 2015-07-15 HISTORY — DX: Dizziness and giddiness: R42

## 2015-07-15 SURGERY — BLEPHAROPLASTY
Anesthesia: Monitor Anesthesia Care | Laterality: Bilateral | Wound class: Clean

## 2015-07-15 MED ORDER — OXYCODONE HCL 5 MG/5ML PO SOLN
5.0000 mg | Freq: Once | ORAL | Status: DC | PRN
Start: 2015-07-15 — End: 2015-07-15

## 2015-07-15 MED ORDER — ERYTHROMYCIN 5 MG/GM OP OINT
TOPICAL_OINTMENT | OPHTHALMIC | Status: DC | PRN
  Administered 2015-07-15: 1 via OPHTHALMIC

## 2015-07-15 MED ORDER — BACITRACIN 500 UNIT/GM OP OINT: TOPICAL_OINTMENT | OPHTHALMIC | Status: DC

## 2015-07-15 MED ORDER — LIDOCAINE-EPINEPHRINE 2 %-1:100000 IJ SOLN
INTRAMUSCULAR | Status: DC | PRN
  Administered 2015-07-15: 2.5 mL via OPHTHALMIC
  Administered 2015-07-15: 1.5 mL via OPHTHALMIC
  Administered 2015-07-15: 1 mL via OPHTHALMIC

## 2015-07-15 MED ORDER — PROMETHAZINE HCL 25 MG/ML IJ SOLN: 6.2500 mg | INTRAMUSCULAR | Status: DC | PRN

## 2015-07-15 MED ORDER — HYDRALAZINE HCL 20 MG/ML IJ SOLN
10.0000 mg | Freq: Once | INTRAMUSCULAR | Status: AC
  Administered 2015-07-15: 10 mg via INTRAVENOUS

## 2015-07-15 MED ORDER — LACTATED RINGERS IV SOLN: INTRAVENOUS | Status: DC

## 2015-07-15 MED ORDER — HYDROMORPHONE HCL 1 MG/ML IJ SOLN: 0.2500 mg | INTRAMUSCULAR | Status: DC | PRN

## 2015-07-15 MED ORDER — ALFENTANIL 500 MCG/ML IJ INJ
INJECTION | INTRAMUSCULAR | Status: DC | PRN
  Administered 2015-07-15: 100 ug via INTRAVENOUS
  Administered 2015-07-15: 300 ug via INTRAVENOUS
  Administered 2015-07-15 (×3): 100 ug via INTRAVENOUS

## 2015-07-15 MED ORDER — OXYCODONE-ACETAMINOPHEN 5-325 MG PO TABS: 1.0000 | ORAL_TABLET | ORAL | Status: DC | PRN

## 2015-07-15 MED ORDER — MEPERIDINE HCL 25 MG/ML IJ SOLN: 6.2500 mg | INTRAMUSCULAR | Status: DC | PRN

## 2015-07-15 MED ORDER — OXYCODONE HCL 5 MG PO TABS: 5.0000 mg | ORAL_TABLET | Freq: Once | ORAL | Status: DC | PRN

## 2015-07-15 MED ORDER — LACTATED RINGERS IV SOLN
INTRAVENOUS | Status: DC | PRN
  Administered 2015-07-15: 10:00:00 via INTRAVENOUS

## 2015-07-15 MED ORDER — MIDAZOLAM HCL 2 MG/2ML IJ SOLN
INTRAMUSCULAR | Status: DC | PRN
  Administered 2015-07-15: 2 mg via INTRAVENOUS

## 2015-07-15 MED ORDER — TETRACAINE HCL 0.5 % OP SOLN
OPHTHALMIC | Status: DC | PRN
  Administered 2015-07-15: 2 [drp] via OPHTHALMIC

## 2015-07-15 SURGICAL SUPPLY — 36 items
APPLICATOR COTTON TIP WD 3 STR (MISCELLANEOUS) ×6 IMPLANT
BLADE SURG 15 STRL LF DISP TIS (BLADE) ×1 IMPLANT
BLADE SURG 15 STRL SS (BLADE) ×2
CORD BIP STRL DISP 12FT (MISCELLANEOUS) ×3 IMPLANT
DRAPE HEAD BAR (DRAPES) ×3 IMPLANT
GAUZE SPONGE 4X4 12PLY STRL (GAUZE/BANDAGES/DRESSINGS) ×3 IMPLANT
GAUZE SPONGE NON-WVN 2X2 STRL (MISCELLANEOUS) ×10 IMPLANT
GLOVE SURG LX 7.0 MICRO (GLOVE) ×4
GLOVE SURG LX STRL 7.0 MICRO (GLOVE) ×2 IMPLANT
MARKER SKIN XFINE TIP W/RULER (MISCELLANEOUS) ×3 IMPLANT
NEEDLE FILTER BLUNT 18X 1/2SAF (NEEDLE) ×2
NEEDLE FILTER BLUNT 18X1 1/2 (NEEDLE) ×1 IMPLANT
NEEDLE HYPO 30X.5 LL (NEEDLE) ×6 IMPLANT
PACK DRAPE NASAL/ENT (PACKS) ×3 IMPLANT
SOL PREP PVP 2OZ (MISCELLANEOUS) ×3
SOLUTION PREP PVP 2OZ (MISCELLANEOUS) ×1 IMPLANT
SPONGE VERSALON 2X2 STRL (MISCELLANEOUS) ×20
SUT CHROMIC 4-0 (SUTURE)
SUT CHROMIC 4-0 M2 12X2 ARM (SUTURE)
SUT CHROMIC 5 0 P 3 (SUTURE) IMPLANT
SUT ETHILON 4 0 CL P 3 (SUTURE) IMPLANT
SUT MERSILENE 4-0 S-2 (SUTURE) IMPLANT
SUT PDS AB 4-0 P3 18 (SUTURE) IMPLANT
SUT PLAIN GUT (SUTURE) ×3 IMPLANT
SUT PROLENE 5 0 P 3 (SUTURE) IMPLANT
SUT PROLENE 6 0 P 1 18 (SUTURE) ×6 IMPLANT
SUT SILK 4 0 G 3 (SUTURE) IMPLANT
SUT VIC AB 5-0 P-3 18X BRD (SUTURE) IMPLANT
SUT VIC AB 5-0 P3 18 (SUTURE)
SUT VICRYL 6-0  S14 CTD (SUTURE)
SUT VICRYL 6-0 S14 CTD (SUTURE) IMPLANT
SUT VICRYL 7 0 TG140 8 (SUTURE) IMPLANT
SUTURE CHRMC 4-0 M2 12X2 ARM (SUTURE) IMPLANT
SYR 3ML LL SCALE MARK (SYRINGE) ×3 IMPLANT
SYRINGE 10CC LL (SYRINGE) ×3 IMPLANT
WATER STERILE IRR 500ML POUR (IV SOLUTION) ×3 IMPLANT

## 2015-07-15 NOTE — Anesthesia Postprocedure Evaluation (Signed)
Anesthesia Post Note  Patient: Kathy [name]  Procedure(s) Performed: Procedure(s) (LRB): BLEPHAROPLASTY (Bilateral) PTOSIS REPAIR (Bilateral)  Patient location during evaluation: PACU Anesthesia Type: MAC Level of consciousness: awake and alert Pain management: pain level controlled Vital Signs Assessment: post-procedure vital signs reviewed and stable Respiratory status: nonlabored ventilation and spontaneous breathing Cardiovascular status: stable Postop Assessment: no signs of nausea or vomiting and adequate PO intake Anesthetic complications: no    Estill Batten

## 2015-07-15 NOTE — Interval H&P Note (Signed)
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 After consideration of risks, benefits and other options for treatment, the patient has consented to  Procedure(s) with comments: BLEPHAROPLASTY (Bilateral) PTOSIS REPAIR (Bilateral) - CPAP as a surgical intervention .  The patient's history has been reviewed, patient examined, no change in status, stable for surgery.  I have reviewed the patient's chart and labs.  Questions were answered to the patient's satisfaction.     Vickki Muff, Mar Walmer M

## 2015-07-15 NOTE — Op Note (Signed)
Preoperative Diagnosis:  1. Visually significant blepharoptosis both Upper Eyelid(s) 2. Visually significant dermatochalasis both Upper Eyelid(s)  Postoperative Diagnosis:  Same.  Procedure(s) Performed:   1. Blepharoptosis repair with levator aponeurosis advancement both Upper Eyelid(s) 2. Upper eyelid blepharoplasty with excess skin excision  both Upper Eyelid(s)  Teaching Surgeon: Philis Pique. Vickki Muff, M.D.  Assistants: none  Anesthesia: MAC  Specimens: None.  Estimated Blood Loss: Minimal.  Complications: None.  Operative Findings: None Dictated  Procedure:   Allergies were reviewed and the patient Lipitor.   After the risks, benefits, complications and alternatives were discussed with the patient, appropriate informed consent was obtained.  While seated in an upright position and looking in primary gaze, the mid pupillary line was marked on the upper eyelid margins bilaterally. The patient was then brought to the operating suite and reclined supine.  Timeout was conducted and the patient was sedated.  Local anesthetic consisting of a 50-50 mixture of 2% lidocaine with epinephrine and 0.75% bupivacaine with added Hylenex was injected subcutaneously to both upper eyelid(s). After adequate local was instilled, the patient was prepped and draped in the usual sterile fashion for eyelid surgery.   Attention was turned to the upper eyelids. A 23mm upper eyelid crease incision line was marked with calipers on both upper eyelid(s).  A pinch test was used to estimate the amount of excess skin to remove and this was marked in standard blepharoplasty style fashion. Attention was turned to the  right upper eyelid. A #15 blade was used to open the premarked incision line. A skin only flap was excised and hemostasis was obtained with bipolar cautery.   A buttonhole was created orbicularis orbital septum medially to expose the medial fat pocket. This dissected free from fascial attachments, allowed  to prolapse anteriorly, cauterized towards the pedicle base and excised.  Westcott scissors were then used to transect through orbicularis down to the tarsal plate. Epitarsus was dissected to create a smooth surface to suture to. Dissection was then carried superiorly in the plane between orbicularis and orbital septum. Once the preaponeurotic fat pocket was identified, the orbital septum was opened. This revealed the levator and its aponeurosis.    Attention was then turned to the opposite eyelid where the same procedure was performed in the same manner. Hemostasis was obtained with bipolar cautery throughout.   3 interrupted 6-0 Prolene sutures were then passed partial thickness through the tarsal plates of both upper eyelid(s). These sutures were placed in line with the mid pupillary, medial limbal, and lateral limbal lines. The sutures were fixed to the levator aponeurosis and adjusted until a nice lid height and contour were achieved. Once nice symmetry was achieved, the skin incisions were closed with a running 6-0 fast absorbing plain suture. The patient tolerated the procedure well.  Erythromycin ophthalmic ointment was applied to the incision site(s) followed by ice packs. The patient was taken to the recovery area where she recovered without difficulty.  Post-Op Plan/Instructions:  The patient was instructed to use ice packs frequently for the next 48 hours. She was instructed to use bacitracin ophthalmic ointment on her incisions 4 times a day for the next 12 to 14 days. She was given a prescription for Percocet for pain control should Tylenol not be effective. She was asked to to follow up at the [facility] in [place], [place] in 2 weeks' time or sooner as needed for problems.  Teaching Surgeon Attestation: None  Katheline Brendlinger M. Vickki Muff, M.D. Attending,Ophthalmology

## 2015-07-15 NOTE — Anesthesia Preprocedure Evaluation (Addendum)
Anesthesia Evaluation  Patient identified by MRN, date of birth, ID band  Reviewed: Allergy & Precautions, NPO status , Patient's Chart, lab work & pertinent test results, reviewed documented beta blocker date and time   Airway Mallampati: III  TM Distance: >3 FB Neck ROM: Full    Dental no notable dental hx.    Pulmonary sleep apnea and Continuous Positive Airway Pressure Ventilation , former smoker,    Pulmonary exam normal        Cardiovascular hypertension, Pt. on medications Normal cardiovascular exam+ dysrhythmias      Neuro/Psych PSYCHIATRIC DISORDERS Depression    GI/Hepatic negative GI ROS, Neg liver ROS,   Endo/Other  negative endocrine ROS  Renal/GU negative Renal ROS     Musculoskeletal  (+) Arthritis ,   Abdominal   Peds  Hematology negative hematology ROS (+)   Anesthesia Other Findings   Reproductive/Obstetrics                           Anesthesia Physical Anesthesia Plan  ASA: II  Anesthesia Plan: MAC   Post-op Pain Management:    Induction: Intravenous  Airway Management Planned: Simple Face Mask  Additional Equipment:   Intra-op Plan:   Post-operative Plan:   Informed Consent: I have reviewed the patients History and Physical, chart, labs and discussed the procedure including the risks, benefits and alternatives for the proposed anesthesia with the patient or authorized representative who has indicated his/her understanding and acceptance.     Plan Discussed with: CRNA  Anesthesia Plan Comments:         Anesthesia Quick Evaluation

## 2015-07-15 NOTE — H&P (Signed)
  See H&P completed at [facility] on 07/04/2015 that is scanned onto the patient's chart

## 2015-07-15 NOTE — Transfer of Care (Signed)
Immediate Anesthesia Transfer of Care Note  Patient: Kathy [name]  Procedure(s) Performed: Procedure(s) with comments: BLEPHAROPLASTY (Bilateral) PTOSIS REPAIR (Bilateral) - CPAP  Patient Location: PACU  Anesthesia Type: MAC  Level of Consciousness: awake, alert  and patient cooperative  Airway and Oxygen Therapy: Patient Spontanous Breathing and Patient connected to supplemental oxygen  Post-op Assessment: Post-op Vital signs reviewed, Patient's Cardiovascular Status Stable, Respiratory Function Stable, Patent Airway and No signs of Nausea or vomiting  Post-op Vital Signs: Reviewed and stable  Complications: No apparent anesthesia complications

## 2015-07-15 NOTE — Anesthesia Procedure Notes (Signed)
Procedure Name: MAC Date/Time: 07/15/2015 10:05 AM Performed by: Cameron Ali Pre-anesthesia Checklist: Patient identified, Emergency Drugs available, Suction available, Timeout performed and Patient being monitored Patient Re-evaluated:Patient Re-evaluated prior to inductionOxygen Delivery Method: Nasal cannula Placement Confirmation: positive ETCO2

## 2015-07-15 NOTE — Addendum Note (Signed)
Addendum  created 07/15/15 1131 by Estill Batten, DO   Modules edited: Orders

## 2015-07-16 ENCOUNTER — Encounter: Payer: Self-pay | Admitting: Ophthalmology

## 2015-10-14 DIAGNOSIS — M170 Bilateral primary osteoarthritis of knee: Secondary | ICD-10-CM | POA: Diagnosis not present

## 2015-10-14 DIAGNOSIS — M1712 Unilateral primary osteoarthritis, left knee: Secondary | ICD-10-CM | POA: Diagnosis not present

## 2015-10-14 DIAGNOSIS — M1711 Unilateral primary osteoarthritis, right knee: Secondary | ICD-10-CM | POA: Diagnosis not present

## 2015-10-20 DIAGNOSIS — I10 Essential (primary) hypertension: Secondary | ICD-10-CM | POA: Diagnosis not present

## 2015-10-20 DIAGNOSIS — E7800 Pure hypercholesterolemia, unspecified: Secondary | ICD-10-CM | POA: Diagnosis not present

## 2015-10-20 DIAGNOSIS — R600 Localized edema: Secondary | ICD-10-CM | POA: Diagnosis not present

## 2015-10-20 DIAGNOSIS — Z6841 Body Mass Index (BMI) 40.0 and over, adult: Secondary | ICD-10-CM | POA: Diagnosis not present

## 2015-10-20 DIAGNOSIS — R7303 Prediabetes: Secondary | ICD-10-CM | POA: Diagnosis not present

## 2015-10-20 DIAGNOSIS — I872 Venous insufficiency (chronic) (peripheral): Secondary | ICD-10-CM | POA: Diagnosis not present

## 2015-10-20 DIAGNOSIS — Z1389 Encounter for screening for other disorder: Secondary | ICD-10-CM | POA: Diagnosis not present

## 2015-10-20 DIAGNOSIS — E538 Deficiency of other specified B group vitamins: Secondary | ICD-10-CM | POA: Diagnosis not present

## 2015-10-20 DIAGNOSIS — F329 Major depressive disorder, single episode, unspecified: Secondary | ICD-10-CM | POA: Diagnosis not present

## 2015-10-21 DIAGNOSIS — M170 Bilateral primary osteoarthritis of knee: Secondary | ICD-10-CM | POA: Diagnosis not present

## 2015-10-28 DIAGNOSIS — M170 Bilateral primary osteoarthritis of knee: Secondary | ICD-10-CM | POA: Diagnosis not present

## 2016-04-28 DIAGNOSIS — Z9181 History of falling: Secondary | ICD-10-CM | POA: Diagnosis not present

## 2016-04-28 DIAGNOSIS — Z6841 Body Mass Index (BMI) 40.0 and over, adult: Secondary | ICD-10-CM | POA: Diagnosis not present

## 2016-04-28 DIAGNOSIS — I10 Essential (primary) hypertension: Secondary | ICD-10-CM | POA: Diagnosis not present

## 2016-04-28 DIAGNOSIS — E7800 Pure hypercholesterolemia, unspecified: Secondary | ICD-10-CM | POA: Diagnosis not present

## 2016-04-28 DIAGNOSIS — M1710 Unilateral primary osteoarthritis, unspecified knee: Secondary | ICD-10-CM | POA: Diagnosis not present

## 2016-04-28 DIAGNOSIS — E538 Deficiency of other specified B group vitamins: Secondary | ICD-10-CM | POA: Diagnosis not present

## 2016-04-28 DIAGNOSIS — R7303 Prediabetes: Secondary | ICD-10-CM | POA: Diagnosis not present

## 2016-05-03 DIAGNOSIS — M170 Bilateral primary osteoarthritis of knee: Secondary | ICD-10-CM | POA: Diagnosis not present

## 2016-05-10 DIAGNOSIS — M170 Bilateral primary osteoarthritis of knee: Secondary | ICD-10-CM | POA: Diagnosis not present

## 2016-05-17 DIAGNOSIS — M170 Bilateral primary osteoarthritis of knee: Secondary | ICD-10-CM | POA: Diagnosis not present

## 2016-06-22 ENCOUNTER — Encounter (HOSPITAL_COMMUNITY): Payer: Self-pay | Admitting: Emergency Medicine

## 2016-06-22 ENCOUNTER — Ambulatory Visit (INDEPENDENT_AMBULATORY_CARE_PROVIDER_SITE_OTHER): Payer: Medicare Other

## 2016-06-22 ENCOUNTER — Ambulatory Visit (HOSPITAL_COMMUNITY)
Admission: EM | Admit: 2016-06-22 | Discharge: 2016-06-22 | Disposition: A | Discharge status: Home / Self Care | Payer: Medicare Other | Attending: Emergency Medicine | Admitting: Emergency Medicine

## 2016-06-22 DIAGNOSIS — M179 Osteoarthritis of knee, unspecified: Secondary | ICD-10-CM | POA: Diagnosis not present

## 2016-06-22 DIAGNOSIS — J069 Acute upper respiratory infection, unspecified: Secondary | ICD-10-CM

## 2016-06-22 DIAGNOSIS — B9789 Other viral agents as the cause of diseases classified elsewhere: Secondary | ICD-10-CM | POA: Diagnosis not present

## 2016-06-22 DIAGNOSIS — M25562 Pain in left knee: Secondary | ICD-10-CM

## 2016-06-22 MED ORDER — BENZONATATE 100 MG PO CAPS: 100.0000 mg | ORAL_CAPSULE | Freq: Three times a day (TID) | ORAL | 0 refills | Status: DC

## 2016-06-22 MED ORDER — PREDNISONE 10 MG (21) PO TBPK: 10.0000 mg | ORAL_TABLET | Freq: Every day | ORAL | 0 refills | Status: DC

## 2016-06-22 MED ORDER — ALBUTEROL SULFATE HFA 108 (90 BASE) MCG/ACT IN AERS: 1.0000 | INHALATION_SPRAY | Freq: Four times a day (QID) | RESPIRATORY_TRACT | 0 refills | Status: DC | PRN

## 2016-06-22 NOTE — ED Triage Notes (Signed)
Patient reports productive cough, unknown fever, congestion, sinus drainage for 2 days  Left knee pain started yesterday when patient went to take step and leg did not move, no fall

## 2016-06-22 NOTE — Discharge Instructions (Signed)
Take prescription medicine as directed for your cough and respiratory symptoms. For knee pain, no acute findings were found on Xray, rest the knee, wrap with ace bandages, may use ice, and keep the knee elevated. You may take tylenol as needed for pain management. Should symptoms fail to resolve follow up with your primary care provider or return to clinic.

## 2016-06-22 NOTE — ED Provider Notes (Signed)
CSN: [id number]     Arrival date & time 06/22/16  1429 History   First MD Initiated Contact with Patient 06/22/16 1525     Chief Complaint  Patient presents with  . Cough  . Knee Pain   (Consider location/radiation/quality/duration/timing/severity/associated sxs/prior Treatment) 75 year old female presents to clinic with chief complaint of cough and fever for two days and for left knee pain.  Patient reports when she was walking in her house yesterday she went to turn and her knee "did not make the turn" She reports she felt a sharp pain in the knee and has been unable to bear weight on her knee today. With regard to cough and fever, patient reports the symptoms started the day before Christmas, she has been unable to sleep, cough has been worse at night, has not had any N/V/D or body aches. She does report congestion, no sinus pain or pressure. Patient does not smoke and has no environmental exposures.    Cough  Associated symptoms: rhinorrhea and shortness of breath   Associated symptoms: no chest pain, no chills, no ear pain, no fever, no myalgias, no sore throat and no wheezing   Knee Pain  Associated symptoms: no fatigue and no fever     Past Medical History:  Diagnosis Date  . Arthritis    fingers  . Depression   . Dizziness    in AM, getting out of bed  . Dysrhythmia    "skips a beat" sometimes - followed by PCP  . Hypercholesteremia   . Hypertension   . 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 Shortness of breath dyspnea   . Sleep apnea    has CPAP, doesn't use  . Umbilical hernia    Past Surgical History:  Procedure Laterality Date  . ABDOMINAL HYSTERECTOMY    . BROW LIFT Bilateral 07/15/2015   Procedure: BLEPHAROPLASTY;  Surgeon: Karle Starch, MD;  Location: [facility];  Service: Ophthalmology;  Laterality: Bilateral;  . CHOLECYSTECTOMY    . HAMMER TOE SURGERY    . HERNIA REPAIR    . KNEE ARTHROSCOPY Bilateral   . PTOSIS REPAIR Bilateral 07/15/2015   Procedure: PTOSIS REPAIR;  Surgeon: Karle Starch, MD;  Location: [facility];  Service: Ophthalmology;  Laterality: Bilateral;  CPAP  . TONSILLECTOMY     No family history on file. Social History  Substance Use Topics  . Smoking status: Former Research scientist (life sciences)  . Smokeless tobacco: Not on file     Comment: quit 40+ yrs ago  . Alcohol use No   OB History    No data available     Review of Systems  Constitutional: Negative for chills, fatigue and fever.  HENT: Positive for congestion and rhinorrhea. Negative for ear discharge, ear pain, sinus pain, sinus pressure and sore throat.   Respiratory: Positive for cough, chest tightness and shortness of breath. Negative for wheezing.   Cardiovascular: Negative for chest pain.  Gastrointestinal: Negative for abdominal pain, diarrhea, nausea and vomiting.  Musculoskeletal: Positive for arthralgias and joint swelling. Negative for myalgias.  Neurological: Negative for dizziness, weakness and light-headedness.    Allergies  Lipitor [atorvastatin]  Home Medications   Prior to Admission medications   Medication Sig Start Date End Date Taking? Authorizing Provider  fenofibrate micronized (LOFIBRA) 134 MG capsule Take 134 mg by mouth daily before breakfast.   Yes Historical Provider, MD  furosemide (LASIX) 80 MG tablet Take 80 mg by mouth every morning. 07/22/14  Yes  Historical Provider, MD  losartan (COZAAR) 100 MG tablet Take 100 mg by mouth daily.   Yes Historical Provider, MD  metoprolol succinate (TOPROL-XL) 50 MG 24 hr tablet Take 50 mg by mouth daily. 06/25/14  Yes Historical Provider, MD  venlafaxine XR (EFFEXOR-XR) 150 MG 24 hr capsule Take 150 mg by mouth daily. 05/17/14  Yes Historical Provider, MD  albuterol (PROVENTIL HFA;VENTOLIN HFA) 108 (90 Base) MCG/ACT inhaler Inhale 1-2 puffs into the lungs every 6 (six) hours as needed for wheezing or shortness of breath. 06/22/16   Barnet Glasgow, NP  bacitracin ophthalmic ointment Use on  sutures 4 times a day for 12-14 days Patient not taking: Reported on 06/22/2016 07/15/15   Amy Dennie Maizes, MD  benzonatate (TESSALON) 100 MG capsule Take 1 capsule (100 mg total) by mouth every 8 (eight) hours. 06/22/16   Barnet Glasgow, NP  meloxicam (MOBIC) 15 MG tablet Take 15 mg by mouth daily.  07/26/14   Historical Provider, MD  oxyCODONE-acetaminophen (PERCOCET) 5-325 MG tablet Take 1 tablet by mouth every 4 (four) hours as needed for severe pain. 07/15/15   Amy Dennie Maizes, MD  predniSONE (STERAPRED UNI-PAK 21 TAB) 10 MG (21) TBPK tablet Take 1 tablet (10 mg total) by mouth daily. Take 6 tablets today, decrease by 1 tablet each day until finished 06/22/16   Barnet Glasgow, NP  rOPINIRole (REQUIP) 1 MG tablet Take 1 mg by mouth every evening. 05/17/14   Historical Provider, MD  traMADol (ULTRAM) 50 MG tablet Take 1 tablet (50 mg total) by mouth every 6 (six) hours as needed. 08/01/14   Julianne Rice, MD  VIGAMOX 0.5 % ophthalmic solution Place 1 drop into the left eye 3 (three) times daily.  07/29/14   Historical Provider, MD   Meds Ordered and Administered this Visit  Medications - No data to display  BP 156/61 (BP Location: Right Arm) Comment (BP Location): large cuff  Pulse 70   Temp 100.1 F (37.8 C) (Oral)   Resp 24   SpO2 100%  No data found.   Physical Exam  Urgent Care Course   Clinical Course     Procedures (including critical care time)  Labs Review Labs Reviewed - No data to display  Imaging Review Dg Knee Complete 4 Views Left  Result Date: 06/22/2016 CLINICAL DATA:  Chronic left knee pain EXAM: LEFT KNEE - COMPLETE 4+ VIEW COMPARISON:  None. FINDINGS: There is no joint effusion. Severe tricompartment osteoarthritis is noted with marked joint space narrowing and exuberant marginal spur formation. No fracture or subluxation. IMPRESSION: 1. Severe tricompartment osteoarthritis. 2. No acute findings. Electronically Signed   By: Kerby Moors M.D.   On: 06/22/2016  15:50     Visual Acuity Review  Right Eye Distance:   Left Eye Distance:   Bilateral Distance:    Right Eye Near:   Left Eye Near:    Bilateral Near:         MDM   1. Viral URI with cough   2. Acute pain of left knee    Viral URI with cough: Rx Tessalon, Prednisone dose pack, and albuterol inhaler.   For Knee: Recommend Rest, Ice, Compression, and Elevation, may take tylenol for pain management.  Should symptoms fail to improve or worsen follow up with PCP or return to clinic.    Barnet Glasgow, NP 06/22/16 [phone]

## 2016-06-27 ENCOUNTER — Emergency Department (HOSPITAL_COMMUNITY): Payer: Medicare Other

## 2016-06-27 ENCOUNTER — Inpatient Hospital Stay (HOSPITAL_COMMUNITY)
Admission: EM | Admit: 2016-06-27 | Discharge: 2016-07-04 | DRG: 193 | Disposition: A | Discharge status: Home Health | Payer: Medicare Other | Attending: Internal Medicine | Admitting: Internal Medicine

## 2016-06-27 ENCOUNTER — Encounter (HOSPITAL_COMMUNITY): Payer: Self-pay | Admitting: *Deleted

## 2016-06-27 ENCOUNTER — Ambulatory Visit (INDEPENDENT_AMBULATORY_CARE_PROVIDER_SITE_OTHER)
Admission: EM | Admit: 2016-06-27 | Discharge: 2016-06-27 | Disposition: A | Discharge status: Home / Self Care | Payer: Medicare Other | Source: Home / Self Care | Attending: Emergency Medicine | Admitting: Emergency Medicine

## 2016-06-27 ENCOUNTER — Encounter (HOSPITAL_COMMUNITY): Payer: Self-pay | Admitting: Internal Medicine

## 2016-06-27 DIAGNOSIS — E785 Hyperlipidemia, unspecified: Secondary | ICD-10-CM | POA: Diagnosis present

## 2016-06-27 DIAGNOSIS — Z79899 Other long term (current) drug therapy: Secondary | ICD-10-CM

## 2016-06-27 DIAGNOSIS — R509 Fever, unspecified: Secondary | ICD-10-CM | POA: Diagnosis not present

## 2016-06-27 DIAGNOSIS — E876 Hypokalemia: Secondary | ICD-10-CM | POA: Diagnosis present

## 2016-06-27 DIAGNOSIS — Z90710 Acquired absence of both cervix and uterus: Secondary | ICD-10-CM | POA: Diagnosis not present

## 2016-06-27 DIAGNOSIS — G4733 Obstructive sleep apnea (adult) (pediatric): Secondary | ICD-10-CM | POA: Diagnosis not present

## 2016-06-27 DIAGNOSIS — R0602 Shortness of breath: Secondary | ICD-10-CM

## 2016-06-27 DIAGNOSIS — J189 Pneumonia, unspecified organism: Secondary | ICD-10-CM | POA: Diagnosis present

## 2016-06-27 DIAGNOSIS — Z888 Allergy status to other drugs, medicaments and biological substances status: Secondary | ICD-10-CM

## 2016-06-27 DIAGNOSIS — Z791 Long term (current) use of non-steroidal anti-inflammatories (NSAID): Secondary | ICD-10-CM

## 2016-06-27 DIAGNOSIS — B001 Herpesviral vesicular dermatitis: Secondary | ICD-10-CM | POA: Diagnosis not present

## 2016-06-27 DIAGNOSIS — G629 Polyneuropathy, unspecified: Secondary | ICD-10-CM | POA: Diagnosis present

## 2016-06-27 DIAGNOSIS — B002 Herpesviral gingivostomatitis and pharyngotonsillitis: Secondary | ICD-10-CM

## 2016-06-27 DIAGNOSIS — Z9119 Patient's noncompliance with other medical treatment and regimen: Secondary | ICD-10-CM | POA: Diagnosis not present

## 2016-06-27 DIAGNOSIS — M19049 Primary osteoarthritis, unspecified hand: Secondary | ICD-10-CM | POA: Diagnosis present

## 2016-06-27 DIAGNOSIS — Z820 Family history of epilepsy and other diseases of the nervous system: Secondary | ICD-10-CM | POA: Diagnosis not present

## 2016-06-27 DIAGNOSIS — J1008 Influenza due to other identified influenza virus with other specified pneumonia: Principal | ICD-10-CM | POA: Diagnosis present

## 2016-06-27 DIAGNOSIS — R0689 Other abnormalities of breathing: Secondary | ICD-10-CM | POA: Diagnosis not present

## 2016-06-27 DIAGNOSIS — R0902 Hypoxemia: Secondary | ICD-10-CM

## 2016-06-27 DIAGNOSIS — I10 Essential (primary) hypertension: Secondary | ICD-10-CM

## 2016-06-27 DIAGNOSIS — Z8249 Family history of ischemic heart disease and other diseases of the circulatory system: Secondary | ICD-10-CM | POA: Diagnosis not present

## 2016-06-27 DIAGNOSIS — J1100 Influenza due to unidentified influenza virus with unspecified type of pneumonia: Secondary | ICD-10-CM | POA: Diagnosis present

## 2016-06-27 DIAGNOSIS — Z23 Encounter for immunization: Secondary | ICD-10-CM

## 2016-06-27 DIAGNOSIS — J129 Viral pneumonia, unspecified: Secondary | ICD-10-CM | POA: Diagnosis present

## 2016-06-27 DIAGNOSIS — G4730 Sleep apnea, unspecified: Secondary | ICD-10-CM

## 2016-06-27 DIAGNOSIS — R05 Cough: Secondary | ICD-10-CM | POA: Diagnosis not present

## 2016-06-27 DIAGNOSIS — J9691 Respiratory failure, unspecified with hypoxia: Secondary | ICD-10-CM | POA: Diagnosis present

## 2016-06-27 DIAGNOSIS — Z9981 Dependence on supplemental oxygen: Secondary | ICD-10-CM | POA: Diagnosis not present

## 2016-06-27 DIAGNOSIS — Z823 Family history of stroke: Secondary | ICD-10-CM | POA: Diagnosis not present

## 2016-06-27 DIAGNOSIS — J1108 Influenza due to unidentified influenza virus with specified pneumonia: Secondary | ICD-10-CM | POA: Diagnosis not present

## 2016-06-27 DIAGNOSIS — F329 Major depressive disorder, single episode, unspecified: Secondary | ICD-10-CM | POA: Diagnosis not present

## 2016-06-27 DIAGNOSIS — F32A Depression, unspecified: Secondary | ICD-10-CM

## 2016-06-27 DIAGNOSIS — Z87891 Personal history of nicotine dependence: Secondary | ICD-10-CM | POA: Diagnosis not present

## 2016-06-27 LAB — CBC WITH DIFFERENTIAL/PLATELET
BASOS ABS: 0.1 10*3/uL (ref 0.0–0.1)
BASOS PCT: 1 %
EOS ABS: 0 10*3/uL (ref 0.0–0.7)
Eosinophils Relative: 0 %
HCT: 37.2 % (ref 36.0–46.0)
HEMOGLOBIN: 12.6 g/dL (ref 12.0–15.0)
LYMPHS PCT: 24 %
Lymphs Abs: 2.1 10*3/uL (ref 0.7–4.0)
MCH: 31.3 pg (ref 26.0–34.0)
MCHC: 33.9 g/dL (ref 30.0–36.0)
MCV: 92.3 fL (ref 78.0–100.0)
Monocytes Absolute: 0.6 10*3/uL (ref 0.1–1.0)
Monocytes Relative: 7 %
NEUTROS PCT: 68 %
Neutro Abs: 6 10*3/uL (ref 1.7–7.7)
Platelets: 202 10*3/uL (ref 150–400)
RBC: 4.03 MIL/uL (ref 3.87–5.11)
RDW: 13.5 % (ref 11.5–15.5)
WBC: 8.8 10*3/uL (ref 4.0–10.5)

## 2016-06-27 LAB — I-STAT CHEM 8, ED
BUN: 27 mg/dL — AB (ref 6–20)
CHLORIDE: 96 mmol/L — AB (ref 101–111)
Calcium, Ion: 0.9 mmol/L — ABNORMAL LOW (ref 1.15–1.40)
Creatinine, Ser: 1 mg/dL (ref 0.44–1.00)
Glucose, Bld: 132 mg/dL — ABNORMAL HIGH (ref 65–99)
HEMATOCRIT: 36 % (ref 36.0–46.0)
Hemoglobin: 12.2 g/dL (ref 12.0–15.0)
POTASSIUM: 4.3 mmol/L (ref 3.5–5.1)
SODIUM: 135 mmol/L (ref 135–145)
TCO2: 29 mmol/L (ref 0–100)

## 2016-06-27 LAB — I-STAT CG4 LACTIC ACID, ED: LACTIC ACID, VENOUS: 3 mmol/L — AB (ref 0.5–1.9)

## 2016-06-27 MED ORDER — ALBUTEROL SULFATE (2.5 MG/3ML) 0.083% IN NEBU
5.0000 mg | INHALATION_SOLUTION | Freq: Once | RESPIRATORY_TRACT | Status: AC
  Administered 2016-06-27: 5 mg via RESPIRATORY_TRACT
  Filled 2016-06-27: qty 6

## 2016-06-27 MED ORDER — IPRATROPIUM-ALBUTEROL 0.5-2.5 (3) MG/3ML IN SOLN
3.0000 mL | Freq: Four times a day (QID) | RESPIRATORY_TRACT | Status: DC
  Administered 2016-06-28: 3 mL via RESPIRATORY_TRACT
  Filled 2016-06-27: qty 3

## 2016-06-27 MED ORDER — DEXTROSE 5 % IV SOLN
1.0000 g | INTRAVENOUS | Status: AC
  Administered 2016-06-28 – 2016-07-01 (×4): 1 g via INTRAVENOUS
  Filled 2016-06-27 (×5): qty 10

## 2016-06-27 MED ORDER — IPRATROPIUM BROMIDE 0.02 % IN SOLN
0.5000 mg | Freq: Once | RESPIRATORY_TRACT | Status: AC
Start: 2016-06-27 — End: 2016-06-27
  Administered 2016-06-27: 0.5 mg via RESPIRATORY_TRACT
  Filled 2016-06-27: qty 2.5

## 2016-06-27 MED ORDER — SODIUM CHLORIDE 0.9 % IJ SOLN
INTRAMUSCULAR | Status: AC
  Filled 2016-06-27: qty 10

## 2016-06-27 MED ORDER — PNEUMOCOCCAL VAC POLYVALENT 25 MCG/0.5ML IJ INJ
0.5000 mL | INJECTION | INTRAMUSCULAR | Status: AC
  Administered 2016-06-30: 0.5 mL via INTRAMUSCULAR
  Filled 2016-06-27: qty 0.5

## 2016-06-27 MED ORDER — ENOXAPARIN SODIUM 40 MG/0.4ML ~~LOC~~ SOLN
40.0000 mg | Freq: Every day | SUBCUTANEOUS | Status: DC
  Administered 2016-06-28 – 2016-07-03 (×7): 40 mg via SUBCUTANEOUS
  Filled 2016-06-27 (×7): qty 0.4

## 2016-06-27 MED ORDER — DEXTROSE 5 % IV SOLN
1.0000 g | Freq: Once | INTRAVENOUS | Status: AC
  Administered 2016-06-27: 1 g via INTRAVENOUS
  Filled 2016-06-27: qty 10

## 2016-06-27 MED ORDER — AZITHROMYCIN 500 MG PO TABS
500.0000 mg | ORAL_TABLET | ORAL | Status: AC
  Administered 2016-06-28 – 2016-07-01 (×4): 500 mg via ORAL
  Filled 2016-06-27 (×4): qty 1

## 2016-06-27 MED ORDER — LOSARTAN POTASSIUM 50 MG PO TABS
100.0000 mg | ORAL_TABLET | Freq: Every day | ORAL | Status: DC
  Administered 2016-06-28 – 2016-07-04 (×7): 100 mg via ORAL
  Filled 2016-06-27 (×7): qty 2

## 2016-06-27 MED ORDER — ALBUTEROL SULFATE (2.5 MG/3ML) 0.083% IN NEBU
2.5000 mg | INHALATION_SOLUTION | RESPIRATORY_TRACT | Status: DC | PRN
  Administered 2016-06-28 – 2016-06-29 (×2): 2.5 mg via RESPIRATORY_TRACT
  Filled 2016-06-27 (×2): qty 3

## 2016-06-27 MED ORDER — ALBUTEROL SULFATE (2.5 MG/3ML) 0.083% IN NEBU
2.5000 mg | INHALATION_SOLUTION | Freq: Once | RESPIRATORY_TRACT | Status: AC
  Administered 2016-06-27: 2.5 mg via RESPIRATORY_TRACT

## 2016-06-27 MED ORDER — VENLAFAXINE HCL ER 75 MG PO CP24
150.0000 mg | ORAL_CAPSULE | Freq: Every day | ORAL | Status: DC
  Administered 2016-06-28 – 2016-07-04 (×7): 150 mg via ORAL
  Filled 2016-06-27 (×2): qty 2
  Filled 2016-06-27: qty 1
  Filled 2016-06-27: qty 2
  Filled 2016-06-27 (×3): qty 1

## 2016-06-27 MED ORDER — ALBUTEROL SULFATE (2.5 MG/3ML) 0.083% IN NEBU
INHALATION_SOLUTION | RESPIRATORY_TRACT | Status: AC
  Filled 2016-06-27: qty 3

## 2016-06-27 MED ORDER — SODIUM CHLORIDE 0.9 % IV BOLUS (SEPSIS)
500.0000 mL | Freq: Once | INTRAVENOUS | Status: AC
  Administered 2016-06-27: 500 mL via INTRAVENOUS

## 2016-06-27 MED ORDER — DEXTROSE 5 % IV SOLN
500.0000 mg | Freq: Once | INTRAVENOUS | Status: AC
  Administered 2016-06-27: 500 mg via INTRAVENOUS
  Filled 2016-06-27: qty 500

## 2016-06-27 NOTE — ED Provider Notes (Signed)
CSN: [id number]     Arrival date & time 06/27/16  1639 History   First MD Initiated Contact with Patient 06/27/16 1702     Chief Complaint  Patient presents with  . Shortness of Breath   (Consider location/radiation/quality/duration/timing/severity/associated sxs/prior Treatment) 75 year old female is accompanied by husband to the urgent care states that in the past week she has gotten worse since her last visit here where she had some cough and wheeze. Her breathing is worse, complains of shortness of breath she is having some chest heaviness for 2 days and the cough with low-grade fever. She has not had any significant changes in the past 2-3 days. She just not getting any better. The prednisone and inhalers are not working for her. She is awake, alert and speaking in complete sentences.       Past Medical History:  Diagnosis Date  . Arthritis    fingers  . Depression   . Dizziness    in AM, getting out of bed  . Dysrhythmia    "skips a beat" sometimes - followed by PCP  . Hypercholesteremia   . Hypertension   . Neuropathy ([place])    bilateral feet  . Shortness of breath dyspnea   . Sleep apnea    has CPAP, doesn't use  . Umbilical hernia    Past Surgical History:  Procedure Laterality Date  . ABDOMINAL HYSTERECTOMY    . BROW LIFT Bilateral 07/15/2015   Procedure: BLEPHAROPLASTY;  Surgeon: Karle Starch, MD;  Location: [place];  Service: Ophthalmology;  Laterality: Bilateral;  . CHOLECYSTECTOMY    . HAMMER TOE SURGERY    . HERNIA REPAIR    . KNEE ARTHROSCOPY Bilateral   . PTOSIS REPAIR Bilateral 07/15/2015   Procedure: PTOSIS REPAIR;  Surgeon: Karle Starch, MD;  Location: [place];  Service: Ophthalmology;  Laterality: Bilateral;  CPAP  . TONSILLECTOMY     History reviewed. No pertinent family history. Social History  Substance Use Topics  . Smoking status: Former Research scientist (life sciences)  . Smokeless tobacco: Not on file     Comment: quit 40+ yrs ago  . Alcohol  use No   OB History    No data available     Review of Systems  Constitutional: Positive for activity change, appetite change, fatigue and fever.  HENT: Positive for congestion.   Respiratory: Positive for cough, chest tightness, shortness of breath and wheezing.   Gastrointestinal: Negative.   Genitourinary: Negative.   All other systems reviewed and are negative.   Allergies  Lipitor [atorvastatin]  Home Medications   Prior to Admission medications   Medication Sig Start Date End Date Taking? Authorizing Provider  albuterol (PROVENTIL HFA;VENTOLIN HFA) 108 (90 Base) MCG/ACT inhaler Inhale 1-2 puffs into the lungs every 6 (six) hours as needed for wheezing or shortness of breath. 06/22/16   Barnet Glasgow, NP  benzonatate (TESSALON) 100 MG capsule Take 1 capsule (100 mg total) by mouth every 8 (eight) hours. 06/22/16   Barnet Glasgow, NP  fenofibrate micronized (LOFIBRA) 134 MG capsule Take 134 mg by mouth daily before breakfast.    Historical Provider, MD  furosemide (LASIX) 80 MG tablet Take 80 mg by mouth every morning. 07/22/14   Historical Provider, MD  losartan (COZAAR) 100 MG tablet Take 100 mg by mouth daily.    Historical Provider, MD  meloxicam (MOBIC) 15 MG tablet Take 15 mg by mouth daily.  07/26/14   Historical Provider, MD  metoprolol succinate (TOPROL-XL) 50 MG 24  hr tablet Take 50 mg by mouth daily. 06/25/14   Historical Provider, MD  oxyCODONE-acetaminophen (PERCOCET) 5-325 MG tablet Take 1 tablet by mouth every 4 (four) hours as needed for severe pain. 07/15/15   Amy Dennie Maizes, MD  predniSONE (STERAPRED UNI-PAK 21 TAB) 10 MG (21) TBPK tablet Take 1 tablet (10 mg total) by mouth daily. Take 6 tablets today, decrease by 1 tablet each day until finished 06/22/16   Barnet Glasgow, NP  rOPINIRole (REQUIP) 1 MG tablet Take 1 mg by mouth every evening. 05/17/14   Historical Provider, MD  traMADol (ULTRAM) 50 MG tablet Take 1 tablet (50 mg total) by mouth every 6 (six)  hours as needed. 08/01/14   Julianne Rice, MD  venlafaxine XR (EFFEXOR-XR) 150 MG 24 hr capsule Take 150 mg by mouth daily. 05/17/14   Historical Provider, MD  VIGAMOX 0.5 % ophthalmic solution Place 1 drop into the left eye 3 (three) times daily.  07/29/14   Historical Provider, MD   Meds Ordered and Administered this Visit   Medications  albuterol (PROVENTIL) (2.5 MG/3ML) 0.083% nebulizer solution 2.5 mg (not administered)    BP 148/82 (BP Location: Right Arm)   Pulse 78   Temp 100.3 F (37.9 C) (Oral)   Resp 22   SpO2 90%  No data found.   Physical Exam  Constitutional: She is oriented to person, place, and time. She appears well-developed and well-nourished. No distress.  HENT:  Head: Normocephalic and atraumatic.  Neck: Neck supple.  Cardiovascular: Normal rate, regular rhythm and normal heart sounds.   Pulmonary/Chest: She has wheezes. She has rales.  Poor to fair air movement although relatively comfortable at rest. Wheezes across the upper lobe bibasilar crackles extending approximately a third of the way up the chest.   Musculoskeletal:  Mild nonpitting edema to the lower extremities. Lower extremities with tenderness to the skin.  Lymphadenopathy:    She has no cervical adenopathy.  Neurological: She is alert and oriented to person, place, and time.  Skin: Skin is warm and dry. No rash noted.  Psychiatric: She has a normal mood and affect.  Nursing note and vitals reviewed.   Urgent Care Course   Clinical Course     Procedures (including critical care time)  Labs Review Labs Reviewed - No data to display  Imaging Review No results found.   Visual Acuity Review  Right Eye Distance:   Left Eye Distance:   Bilateral Distance:    Right Eye Near:   Left Eye Near:    Bilateral Near:         MDM   1. Shortness of breath   2. Abnormal breath sounds   3. Hypoxia   4. Fever, unspecified fever cause    GO to the [facility] ED now for evaluation and  treatment. This 75 year old female has not improved at all since her visit here approximately one week ago. Her shortness of breath is worse. Her sats were 100% last week now 90%. She has by basilar crackles and upper airway wheezing. Differential includes pneumonia and/or pulmonary effusion or edema. She will be given a short of your all inhaler for her wheezing and I believe she stable enough to be transferred to department via private vehicle. Again while sitting she is relatively comfortable and states that this is the way she has been feeling for several days and no acute change today.    Janne Napoleon, NP 06/27/16 [phone]

## 2016-06-27 NOTE — ED Notes (Signed)
This RN attempted x2 for IV access without success. 

## 2016-06-27 NOTE — ED Notes (Signed)
Spoke to Dr. Alvino Chapel regarding new blood cults and lactic acid orders. MD states to hold antix.

## 2016-06-27 NOTE — ED Notes (Signed)
Lab just completed 2nd blood culture.

## 2016-06-27 NOTE — ED Notes (Signed)
Pt  Has  Pitting   Edema       Of   Extremities   As   Well

## 2016-06-27 NOTE — ED Provider Notes (Signed)
[place] DEPT Provider Note   CSN: [id number] Arrival date & time: 06/27/16  1757     History   Chief Complaint Chief Complaint  Patient presents with  . Shortness of Breath    HPI Kathy [name] is a 75 y.o. female.  HPI Patient presents with shortness of breath and cough over the last week. She's been seen in urgent care twice. Few days ago she was given steroids and breathing treatments. Return today after his continued to worsen. Found to be hypoxic there and sent to the ER. Upon arrival to the ER she had sats of 77%. States no history of COPD. She's had a little bit of a minimally productive cough. Low-grade fevers. No swelling or legs. No chest pain. States she does feel little bad all over.   Past Medical History:  Diagnosis Date  . Arthritis    fingers  . Depression   . Dizziness    in AM, getting out of bed  . Dysrhythmia    "skips a beat" sometimes - followed by PCP  . Hypercholesteremia   . Hypertension   . Neuropathy ([place])    bilateral feet  . Shortness of breath dyspnea   . Sleep apnea    has CPAP, doesn't use  . Umbilical hernia     Patient Active Problem List   Diagnosis Date Noted  . CAP (community acquired pneumonia) 06/27/2016  . Hypertension 06/27/2016  . Depression 06/27/2016  . Sleep apnea 06/27/2016    Past Surgical History:  Procedure Laterality Date  . ABDOMINAL HYSTERECTOMY    . BROW LIFT Bilateral 07/15/2015   Procedure: BLEPHAROPLASTY;  Surgeon: Karle Starch, MD;  Location: [place];  Service: Ophthalmology;  Laterality: Bilateral;  . CHOLECYSTECTOMY    . HAMMER TOE SURGERY    . HERNIA REPAIR    . KNEE ARTHROSCOPY Bilateral   . PTOSIS REPAIR Bilateral 07/15/2015   Procedure: PTOSIS REPAIR;  Surgeon: Karle Starch, MD;  Location: [facility];  Service: Ophthalmology;  Laterality: Bilateral;  CPAP  . TONSILLECTOMY      OB History    No data available       Home Medications    Prior to Admission  medications   Medication Sig Start Date End Date Taking? Authorizing Provider  albuterol (PROVENTIL HFA;VENTOLIN HFA) 108 (90 Base) MCG/ACT inhaler Inhale 1-2 puffs into the lungs every 6 (six) hours as needed for wheezing or shortness of breath. 06/22/16  Yes Barnet Glasgow, NP  benzonatate (TESSALON) 100 MG capsule Take 1 capsule (100 mg total) by mouth every 8 (eight) hours. 06/22/16  Yes Barnet Glasgow, NP  fenofibrate micronized (LOFIBRA) 134 MG capsule Take 134 mg by mouth daily before breakfast.   Yes Historical Provider, MD  furosemide (LASIX) 80 MG tablet Take 80 mg by mouth every morning. 07/22/14  Yes Historical Provider, MD  losartan (COZAAR) 100 MG tablet Take 100 mg by mouth daily.   Yes Historical Provider, MD  meloxicam (MOBIC) 15 MG tablet Take 15 mg by mouth daily.  07/26/14  Yes Historical Provider, MD  metoprolol succinate (TOPROL-XL) 50 MG 24 hr tablet Take 50 mg by mouth daily. 06/25/14  Yes Historical Provider, MD  predniSONE (STERAPRED UNI-PAK 21 TAB) 10 MG (21) TBPK tablet Take 1 tablet (10 mg total) by mouth daily. Take 6 tablets today, decrease by 1 tablet each day until finished 06/22/16  Yes Barnet Glasgow, NP  rOPINIRole (REQUIP) 1 MG tablet Take 1 mg by mouth every evening.  05/17/14  Yes Historical Provider, MD  venlafaxine XR (EFFEXOR-XR) 150 MG 24 hr capsule Take 150 mg by mouth daily. 05/17/14  Yes Historical Provider, MD  oxyCODONE-acetaminophen (PERCOCET) 5-325 MG tablet Take 1 tablet by mouth every 4 (four) hours as needed for severe pain. Patient not taking: Reported on 06/27/2016 07/15/15   Amy Dennie Maizes, MD  traMADol (ULTRAM) 50 MG tablet Take 1 tablet (50 mg total) by mouth every 6 (six) hours as needed. Patient not taking: Reported on 06/27/2016 08/01/14   Julianne Rice, MD    Family History Family History  Problem Relation Age of Onset  . Congestive Heart Failure Mother   . Stroke Father   . Parkinson's disease Father     Social History Social  History  Substance Use Topics  . Smoking status: Former Research scientist (life sciences)  . Smokeless tobacco: Never Used     Comment: quit 40+ yrs ago, 1 PPD for a few years  . Alcohol use No     Allergies   Lipitor [atorvastatin]   Review of Systems Review of Systems  Constitutional: Positive for appetite change.  HENT: Negative for congestion.   Respiratory: Positive for shortness of breath and wheezing.   Cardiovascular: Negative for chest pain.  Gastrointestinal: Negative for abdominal pain.  Genitourinary: Negative for dyspareunia.  Musculoskeletal: Negative for back pain.  Skin: Negative for wound.  Neurological: Negative for seizures.  Hematological: Negative for adenopathy.  Psychiatric/Behavioral: Negative for confusion.     Physical Exam Updated Vital Signs BP (!) 118/49   Pulse 81   Temp 100.3 F (37.9 C) (Oral)   Resp 20   Ht 5\' 1"  (1.549 m)   Wt 240 lb (108.9 kg)   SpO2 98%   BMI 45.35 kg/m   Physical Exam  Constitutional: She appears well-developed.  HENT:  Head: Atraumatic.  Eyes: Pupils are equal, round, and reactive to light.  Neck: Neck supple. No JVD present.  Cardiovascular: Normal rate.   Pulmonary/Chest:  Diffuse wheezes and prolonged expirations.  Abdominal: Soft. There is no tenderness.  Musculoskeletal: She exhibits edema.  Mild edema to bilateral LE.  Neurological: She is alert.  Skin: Skin is warm. Capillary refill takes less than 2 seconds.     ED Treatments / Results  Labs (all labs ordered are listed, but only abnormal results are displayed) Labs Reviewed  I-STAT CHEM 8, ED - Abnormal; Notable for the following:       Result Value   Chloride 96 (*)    BUN 27 (*)    Glucose, Bld 132 (*)    Calcium, Ion 0.90 (*)    All other components within normal limits  I-STAT CG4 LACTIC ACID, ED - Abnormal; Notable for the following:    Lactic Acid, Venous 3.00 (*)    All other components within normal limits  CULTURE, BLOOD (ROUTINE X 2)  CULTURE,  BLOOD (ROUTINE X 2)  GRAM STAIN  CULTURE, EXPECTORATED SPUTUM-ASSESSMENT  MRSA PCR SCREENING  CBC WITH DIFFERENTIAL/PLATELET  HIV ANTIBODY (ROUTINE TESTING)  STREP PNEUMONIAE URINARY ANTIGEN  BASIC METABOLIC PANEL  HEPATITIS C ANTIBODY (REFLEX)  INFLUENZA PANEL BY PCR (TYPE A & B, H1N1)  LEGIONELLA PNEUMOPHILA SEROGP 1 UR AG  BASIC METABOLIC PANEL  CBC  PROCALCITONIN  LACTIC ACID, PLASMA    EKG  EKG Interpretation  Date/Time:  Sunday June 27 2016 18:23:20 EST Ventricular Rate:  63 PR Interval:    QRS Duration: 95 QT Interval:  449 QTC Calculation: 460 R Axis:   -4  Text Interpretation:  Sinus rhythm Atrial premature complex Minimal ST depression, lateral leads Confirmed by Alvino Chapel  MD, Norman Piacentini [phone]) on 06/27/2016 7:55:40 PM       Radiology Dg Chest Portable 1 View  Result Date: 06/27/2016 CLINICAL DATA:  Shortness of breath. EXAM: PORTABLE CHEST 1 VIEW COMPARISON:  07/31/2014 FINDINGS: Heart size is normal. There is widespread bronchopneumonia throughout both lungs, left more than right. No dense consolidation or lobar collapse. No effusions. No acute bone finding. IMPRESSION: Widespread bilateral bronchopneumonia left more than right. Electronically Signed   By: Nelson Chimes M.D.   On: 06/27/2016 18:48    Procedures Procedures (including critical care time)  Medications Ordered in ED Medications  enoxaparin (LOVENOX) injection 40 mg (not administered)  cefTRIAXone (ROCEPHIN) 1 g in dextrose 5 % 50 mL IVPB (not administered)  azithromycin (ZITHROMAX) tablet 500 mg (not administered)  ipratropium-albuterol (DUONEB) 0.5-2.5 (3) MG/3ML nebulizer solution 3 mL (not administered)  albuterol (PROVENTIL) (2.5 MG/3ML) 0.083% nebulizer solution 2.5 mg (not administered)  losartan (COZAAR) tablet 100 mg (not administered)  venlafaxine XR (EFFEXOR-XR) 24 hr capsule 150 mg (not administered)  albuterol (PROVENTIL) (2.5 MG/3ML) 0.083% nebulizer solution 5 mg (5 mg  Nebulization Given 06/27/16 1841)  ipratropium (ATROVENT) nebulizer solution 0.5 mg (0.5 mg Nebulization Given 06/27/16 1841)  cefTRIAXone (ROCEPHIN) 1 g in dextrose 5 % 50 mL IVPB (0 g Intravenous Stopped 06/27/16 2007)  azithromycin (ZITHROMAX) 500 mg in dextrose 5 % 250 mL IVPB (0 mg Intravenous Stopped 06/27/16 2040)  albuterol (PROVENTIL) (2.5 MG/3ML) 0.083% nebulizer solution 5 mg (5 mg Nebulization Given 06/27/16 2018)  sodium chloride 0.9 % bolus 500 mL (0 mLs Intravenous Stopped 06/27/16 2130)     Initial Impression / Assessment and Plan / ED Course  I have reviewed the triage vital signs and the nursing notes.  Pertinent labs & imaging results that were available during my care of the patient were reviewed by me and considered in my medical decision making (see chart for details).  Clinical Course     Patient was shortness of breath. Found to have pneumonia. Hypoxic on room air but improved on oxygen. Will admit to stepdown bed to internal medicine.    Final Clinical Impressions(s) / ED Diagnoses   Final diagnoses:  Community acquired pneumonia, unspecified laterality  CAP (community acquired pneumonia)    New Prescriptions Current Discharge Medication List       Davonna Belling, MD 06/27/16 2314

## 2016-06-27 NOTE — ED Triage Notes (Signed)
Patient comes in with sob that started last Monday. Patient has dry, non productive cough. Patient was 77% on RA upon ED arrival. No hx of copd or asthma. Not a smoker. Patient brought back on non rebreather sats mid 90s.

## 2016-06-27 NOTE — ED Notes (Signed)
EKG delayed due to low O2 sats and immediate room placement.

## 2016-06-27 NOTE — Discharge Instructions (Signed)
GO to the [facility] ED now for evaluation and treatment.

## 2016-06-27 NOTE — H&P (Signed)
Date: 06/27/2016               Patient Name:  Kathy [name] MRN: [id number]  DOB: Oct 11, 1940 Age / Sex: 75 y.o., female   PCP: No Pcp Per Patient         Medical Service: Internal Medicine Teaching Service         Attending Physician: Dr. Aldine Contes, MD    First Contact: Dr. Hetty Ely Pager: [phone]  Second Contact: Dr. Juleen China Pager: [phone]       After Hours (After 5p/  First Contact Pager: [phone]  weekends / holidays): Second Contact Pager: [phone]   Chief Complaint: shortness of breath  History of Present Illness: Kathy [name] is a 75yo female with PMH of OSA, HTN, HLD, MDD, arthritis and neuropathy presenting with shortness of breath. Patient was evaluated last week at urgent care of URI symptoms with cough and prescribed prednisone and albuterol. Patient states that she has continued to get worse every day and developed progressive shortness of breath with chest pressure day prior to admission. She was again seen in urgent care today and referred to the ED for evaluation. Patient states the her shortness of breath is at rest and is not aggravated by position change; she endorses continued rhinorrhea, congestion,  nonproductive cough, and poor appetite. She denies myalgias and fevers; she is unsure about having chills as they heating units at home broke down a couple of days ago but patient and husband state they have been able to keep warm in one part of the house. She endorses diarrhea over the last few days since being sick, <5 episodes a day, without melena or hematochezia, and not associated with abdominal pain, nausea or vomiting. She states she has periods of time where this occurs, but did state it was a little worse than normal. Patient endorses decreased food intake but adequate fluid intake (however, would not quantify).   At arrival to ED, patient was found to be hypoxic to 70's which improved with non-rebreather.   Meds:  Current Meds  Medication Sig  . albuterol  (PROVENTIL HFA;VENTOLIN HFA) 108 (90 Base) MCG/ACT inhaler Inhale 1-2 puffs into the lungs every 6 (six) hours as needed for wheezing or shortness of breath.  . benzonatate (TESSALON) 100 MG capsule Take 1 capsule (100 mg total) by mouth every 8 (eight) hours.  . fenofibrate micronized (LOFIBRA) 134 MG capsule Take 134 mg by mouth daily before breakfast.  . furosemide (LASIX) 80 MG tablet Take 80 mg by mouth every morning.  Marland Kitchen losartan (COZAAR) 100 MG tablet Take 100 mg by mouth daily.  . meloxicam (MOBIC) 15 MG tablet Take 15 mg by mouth daily.   . metoprolol succinate (TOPROL-XL) 50 MG 24 hr tablet Take 50 mg by mouth daily.  . predniSONE (STERAPRED UNI-PAK 21 TAB) 10 MG (21) TBPK tablet Take 1 tablet (10 mg total) by mouth daily. Take 6 tablets today, decrease by 1 tablet each day until finished  . rOPINIRole (REQUIP) 1 MG tablet Take 1 mg by mouth every evening.  . venlafaxine XR (EFFEXOR-XR) 150 MG 24 hr capsule Take 150 mg by mouth daily.   Allergies: Allergies as of 06/27/2016 - Review Complete 06/27/2016  Allergen Reaction Noted  . Lipitor [atorvastatin] Other (See Comments) 07/08/2015   Past Medical History:  Diagnosis Date  . Arthritis    fingers  . Depression   . Dizziness    in AM, getting out of bed  . Dysrhythmia    "  skips a beat" sometimes - followed by PCP  . Hypercholesteremia   . Hypertension   . Neuropathy ([place])    bilateral feet  . Shortness of breath dyspnea   . Sleep apnea    has CPAP, doesn't use  . Umbilical hernia    Family History: Mother with CHF; Father with MI, CVA, Parkinson's  Social History: Patient endorses <10pack year smoking history; she quit >30 years ago; she is in contact with second-hand smoke from husband. Denies alcohol or drug use.  Review of Systems: A complete ROS was negative except as per HPI.   Physical Exam: Blood pressure (!) 118/49, pulse 81, temperature 100.3 F (37.9 C), temperature source Oral, resp. rate 20, height 5\' 1"   (1.549 m), weight 240 lb (108.9 kg), SpO2 98 %. General: alert, well-developed, and cooperative to examination.  Head: normocephalic and atraumatic.  Eyes: vision grossly intact, pupils equal, pupils round, pupils reactive to light, no injection and anicteric.  Mouth: pharynx pink and moist, no erythema, and no exudates.  Neck: supple, full ROM, bilateral submandibular lymphadenopathy  Lungs: No increased work of breathing, anterior wheezing, crackles R up to level of scapula, basilar crackles on L.  Heart: normal rate, regular rhythm, no murmur, no gallop, and no rub.  Abdomen: soft, normal bowel sounds, no distention, no guarding, no rebound tenderness, no hepatomegaly, and no splenomegaly. Mildly tender in LLQ where she states her hernia is, though I am unable to appreciate it. Msk: no joint swelling, no joint warmth, and no redness over joints.  Pulses: 2+ DP/PT pulses bilaterally Extremities: No cyanosis, clubbing, edema Neurologic: alert & oriented X3, cranial nerves II-XII intact, strength normal in all extremities.  Skin: turgor normal and no rashes.  Psych: Oriented X3, memory intact for recent and remote, normally interactive, good eye contact, not anxious appearing, and not depressed appearing  Labs: WBC 8.8, Hgb 12.6, Hct 37.2, Plts 202 Na 135, K 4.3, Cl 96, CO2 29, BUN 27, Cr 1.0, Glu 132 Lactic Acid 3.0 Procalcitonin 0.25  EKG: I personally reviewed the EKG - Sinus rhythm  CXR: I personally viewed the CXR - poor inspiratory effort and positioning; bilateral consolidation  Assessment & Plan by Problem: Active Problems:   CAP (community acquired pneumonia)  Community-acquired pneumonia: Patient with one week history of progressive viral URI with cough, presenting with shortness of breath, fever and new oxygen requirement. CXR suggestive of pneumonia and procalcitonin positive. Patient successfully weaned to [facility] after breathing treatments.  --Azithromycin and ceftriaxone  started in ED; continue azithromycin 500mg  PO qdaily and ceftriaxone 1g IV qdaily --duoneb q4hrs; albuterol neb q 4hr PRN --Oxygen as needed to maintain sat's >90 --repeat CXR for better quality and evaluation of heart --sputum culture and gram stain --f/u strep pneumo urine Ag, legionella and flu panel --f/u blood cultures  Hypertension: Patient with history of hypertension managed with metoprolol succinate 50 mg daily and losartan 100 mg daily. Blood pressures soft admission. --Restart losartan in the a.m. --Hold Metoprolol for now  Depression: --Continue patient's home dose of venlafaxine XR 150 mg daily  Obstructive sleep apnea: Patient with diagnosed of OSA noncompliant with CPAP therapy.  --Continue encouraging patient to use CPAP  Hypokalemia: Repeat labs patient found to have hypokalemia to 3.0. --KCl 60 mEq once --Repeat Bmet at 8 AM  DVT: Lovenox Diet: HH IVF: s/p 546ml NS bolus; 21ml/hr NS x 8 hrs  Dispo: Admit patient to Inpatient with expected length of stay greater than 2 midnights. Patient in need  of PCP.   Signed: Alphonzo Grieve, MD 06/27/2016, 10:33 PM  Pager [phone]

## 2016-06-27 NOTE — ED Notes (Signed)
IV team at bedside 

## 2016-06-27 NOTE — ED Triage Notes (Signed)
Pt  Reports      Cough      And    Shortness     Of  Breath  Seen  Here  6   Days  Ago  Similar     Symptoms          Pt  Reports         aren  Not  Any  Better       Pt  Reports  Feels  Tight  In    Chest

## 2016-06-28 ENCOUNTER — Inpatient Hospital Stay (HOSPITAL_COMMUNITY): Payer: Medicare Other

## 2016-06-28 DIAGNOSIS — J1100 Influenza due to unidentified influenza virus with unspecified type of pneumonia: Secondary | ICD-10-CM | POA: Diagnosis present

## 2016-06-28 DIAGNOSIS — I10 Essential (primary) hypertension: Secondary | ICD-10-CM

## 2016-06-28 DIAGNOSIS — Z9119 Patient's noncompliance with other medical treatment and regimen: Secondary | ICD-10-CM

## 2016-06-28 DIAGNOSIS — Z823 Family history of stroke: Secondary | ICD-10-CM

## 2016-06-28 DIAGNOSIS — Z87891 Personal history of nicotine dependence: Secondary | ICD-10-CM

## 2016-06-28 DIAGNOSIS — F329 Major depressive disorder, single episode, unspecified: Secondary | ICD-10-CM

## 2016-06-28 DIAGNOSIS — E876 Hypokalemia: Secondary | ICD-10-CM

## 2016-06-28 DIAGNOSIS — Z79899 Other long term (current) drug therapy: Secondary | ICD-10-CM

## 2016-06-28 DIAGNOSIS — Z888 Allergy status to other drugs, medicaments and biological substances status: Secondary | ICD-10-CM

## 2016-06-28 DIAGNOSIS — Z8249 Family history of ischemic heart disease and other diseases of the circulatory system: Secondary | ICD-10-CM

## 2016-06-28 DIAGNOSIS — Z820 Family history of epilepsy and other diseases of the nervous system: Secondary | ICD-10-CM

## 2016-06-28 DIAGNOSIS — J189 Pneumonia, unspecified organism: Secondary | ICD-10-CM

## 2016-06-28 DIAGNOSIS — G4733 Obstructive sleep apnea (adult) (pediatric): Secondary | ICD-10-CM

## 2016-06-28 LAB — BASIC METABOLIC PANEL
Anion gap: 11 (ref 5–15)
Anion gap: 5 (ref 5–15)
BUN: 14 mg/dL (ref 6–20)
BUN: 12 mg/dL (ref 6–20)
CALCIUM: 7.9 mg/dL — AB (ref 8.9–10.3)
CALCIUM: 8 mg/dL — AB (ref 8.9–10.3)
CHLORIDE: 101 mmol/L (ref 101–111)
CO2: 29 mmol/L (ref 22–32)
CO2: 31 mmol/L (ref 22–32)
CREATININE: 0.95 mg/dL (ref 0.44–1.00)
Chloride: 98 mmol/L — ABNORMAL LOW (ref 101–111)
Creatinine, Ser: 1.04 mg/dL — ABNORMAL HIGH (ref 0.44–1.00)
GFR calc Af Amer: >60 mL/min (ref >60)
GFR calc non Af Amer: 57 mL/min — ABNORMAL LOW (ref >60)
GFR, EST AFRICAN AMERICAN: 59 mL/min — AB (ref >60)
GFR, EST NON AFRICAN AMERICAN: 51 mL/min — AB (ref >60)
GLUCOSE: 124 mg/dL — AB (ref 65–99)
Glucose, Bld: 132 mg/dL — ABNORMAL HIGH (ref 65–99)
Potassium: 3 mmol/L — ABNORMAL LOW (ref 3.5–5.1)
Potassium: 3.5 mmol/L (ref 3.5–5.1)
SODIUM: 137 mmol/L (ref 135–145)
Sodium: 138 mmol/L (ref 135–145)

## 2016-06-28 LAB — INFLUENZA PANEL BY PCR (TYPE A & B)
INFLAPCR: POSITIVE — AB
INFLBPCR: NEGATIVE

## 2016-06-28 LAB — CBC
HCT: 32.6 % — ABNORMAL LOW (ref 36.0–46.0)
Hemoglobin: 10.7 g/dL — ABNORMAL LOW (ref 12.0–15.0)
MCH: 30.4 pg (ref 26.0–34.0)
MCHC: 32.8 g/dL (ref 30.0–36.0)
MCV: 92.6 fL (ref 78.0–100.0)
PLATELETS: 190 10*3/uL (ref 150–400)
RBC: 3.52 MIL/uL — ABNORMAL LOW (ref 3.87–5.11)
RDW: 13.7 % (ref 11.5–15.5)
WBC: 7 10*3/uL (ref 4.0–10.5)

## 2016-06-28 LAB — STREP PNEUMONIAE URINARY ANTIGEN: STREP PNEUMO URINARY ANTIGEN: NEGATIVE

## 2016-06-28 LAB — HIV ANTIBODY (ROUTINE TESTING W REFLEX): HIV Screen 4th Generation wRfx: NONREACTIVE

## 2016-06-28 LAB — LACTIC ACID, PLASMA: LACTIC ACID, VENOUS: 1.1 mmol/L (ref 0.5–1.9)

## 2016-06-28 LAB — PROCALCITONIN: PROCALCITONIN: 0.25 ng/mL

## 2016-06-28 LAB — MRSA PCR SCREENING: MRSA by PCR: NEGATIVE

## 2016-06-28 MED ORDER — POTASSIUM CHLORIDE 20 MEQ/15ML (10%) PO SOLN
60.0000 meq | Freq: Once | ORAL | Status: AC
  Administered 2016-06-28: 60 meq via ORAL
  Filled 2016-06-28: qty 45

## 2016-06-28 MED ORDER — SODIUM CHLORIDE 0.9 % IV SOLN
INTRAVENOUS | Status: AC
  Administered 2016-06-28: 01:00:00 via INTRAVENOUS

## 2016-06-28 MED ORDER — OSELTAMIVIR PHOSPHATE 30 MG PO CAPS
30.0000 mg | ORAL_CAPSULE | Freq: Two times a day (BID) | ORAL | Status: AC
  Administered 2016-06-28 – 2016-07-02 (×10): 30 mg via ORAL
  Filled 2016-06-28 (×10): qty 1

## 2016-06-28 MED ORDER — GUAIFENESIN ER 600 MG PO TB12
600.0000 mg | ORAL_TABLET | Freq: Two times a day (BID) | ORAL | Status: DC
  Administered 2016-06-28 – 2016-07-04 (×13): 600 mg via ORAL
  Filled 2016-06-28 (×13): qty 1

## 2016-06-28 MED ORDER — IPRATROPIUM-ALBUTEROL 0.5-2.5 (3) MG/3ML IN SOLN
3.0000 mL | RESPIRATORY_TRACT | Status: DC
  Administered 2016-06-28 – 2016-06-29 (×5): 3 mL via RESPIRATORY_TRACT
  Filled 2016-06-28 (×4): qty 3

## 2016-06-28 NOTE — Progress Notes (Signed)
  Subjective: Kathy [name] continues to have cough, difficulty breathing, lethargy, and weakness today.   Objective:  Vital signs in last 24 hours: Vitals:   06/28/16 0539 06/28/16 0739 06/28/16 1108 06/28/16 1226  BP:  (!) 138/57 (!) 163/62   Pulse:  67 71   Resp:      Temp:  99.7 F (37.6 C) 99.3 F (37.4 C)   TempSrc:  Oral Oral   SpO2: 95% 93% 94% 94%  Weight:      Height:       Physical Exam  Constitutional: She is oriented to person, place, and time. She appears well-developed and well-nourished. No distress.  She appears acutely ill   Cardiovascular: Normal rate and regular rhythm.   No murmur heard. Pulmonary/Chest: Effort normal. No respiratory distress. She has no wheezes. She has no rales.  Diffuse crackles over right lung field   Neurological: She is alert and oriented to person, place, and time.  Skin: Skin is warm and dry. She is not diaphoretic.   Assessment/Plan: Pt is a 76 y.o. yo female with a PMHx of OSA, HTN, HLD, MDD, arthritis and neuropathy who was admitted on 06/27/2016 with symptoms of cough and shortness of breath and was found to be flu positive.     CAP (community acquired pneumonia)   Sleep apnea   Influenza with pneumonia Patient presented with shortness of breath, cough, and rhinorrhea. Found to be hypoxic with temp 100.3 in the ED with infiltrates on chest xray and borderline pro-calcitonin, neg strep pneumo antigen and positive influenza A. She initially required a non rebreather mask but has been weaned to 3.5 L nasal canula. We are concerned that she may have viral pneumonia with superimposed bacterial pneumonia.  -continue tamiflu, cefriaxone, and azithromycin.  -follow up legionella antigen  -follow up blood cultures     Hypertension Currently normotensive. Home medications are metoprolol succinate 50 mg qd and losartan 100 mg qd. Holding metoprolol as her blood pressure is well controlled, this can be restarted if she become  hypertensive.  -continue losartan     Depression Will continue to monitor. COntinue home medication venlafaxine XR 150 mg daily.   Hypokalemia Resolved  Dispo: Anticipated discharge in approximately 3-5 day(s).   Ledell Noss, MD 06/28/2016, 1:27 PM Pager: [phone]

## 2016-06-28 NOTE — Progress Notes (Signed)
[facility] Cross Cover Progress Note  Kathy [name] is a 76yo female admitted for CAP. Patient evaluated at bedside per admitting team request. Patient sleeping comfortably and easily woken up. She states that she feels better than at admission, and her breathing is improved. She denies fevers, chills, and myalgias, but does endorse some diaphoresis which she attributes to temp in room. She states she has not had further episodes of diarrhea and does not have abdominal pain. She denies chest pain or discomfort. No concerns at this time.  Physical Exam: Patient on 4L [place] satting in mid 90's. VS stable Constitutional: NAD, appears a lot more comfortable than at admission CV: RRR, no murmurs, rubs or gallops, warm extremities, pulses intact, no edema Resp: no increased work of breathing, air movement improved from admission, diffuse crackles on R side, crackles on lower left lung field; mild wheezing throughout back fields and moderate anteriorly (improved). Ext: warm  Assessment & Plan: Patient with improved respiratory status and exam from admission on current treatment. She is breathing well and saturating adequately on 4L [place] at this time.   --continue current treatment of CAP with azithromycin 500mg  daily and ceftriaxone 1g IV daily --continue tamiflu  --continue supplemental oxygen to maintain saturation >92% --continue duoneb q4hrs, albuterol neb q4hrs PRN --monitor for worsening signs and symptoms  Alphonzo Grieve, MD [facility]MT[facility] - PGY1 Pager [phone]

## 2016-06-29 ENCOUNTER — Inpatient Hospital Stay (HOSPITAL_COMMUNITY): Payer: Medicare Other

## 2016-06-29 LAB — BASIC METABOLIC PANEL
ANION GAP: 8 (ref 5–15)
BUN: 7 mg/dL (ref 6–20)
CALCIUM: 8.1 mg/dL — AB (ref 8.9–10.3)
CO2: 28 mmol/L (ref 22–32)
Chloride: 99 mmol/L — ABNORMAL LOW (ref 101–111)
Creatinine, Ser: 0.74 mg/dL (ref 0.44–1.00)
GLUCOSE: 88 mg/dL (ref 65–99)
Potassium: 3.4 mmol/L — ABNORMAL LOW (ref 3.5–5.1)
Sodium: 135 mmol/L (ref 135–145)

## 2016-06-29 LAB — BLOOD CULTURE ID PANEL (REFLEXED)
ACINETOBACTER BAUMANNII: NOT DETECTED
CANDIDA TROPICALIS: NOT DETECTED
Candida albicans: NOT DETECTED
Candida glabrata: NOT DETECTED
Candida krusei: NOT DETECTED
Candida parapsilosis: NOT DETECTED
ENTEROBACTERIACEAE SPECIES: NOT DETECTED
Enterobacter cloacae complex: NOT DETECTED
Enterococcus species: NOT DETECTED
Escherichia coli: NOT DETECTED
HAEMOPHILUS INFLUENZAE: NOT DETECTED
KLEBSIELLA PNEUMONIAE: NOT DETECTED
Klebsiella oxytoca: NOT DETECTED
Listeria monocytogenes: NOT DETECTED
NEISSERIA MENINGITIDIS: NOT DETECTED
Proteus species: NOT DETECTED
Pseudomonas aeruginosa: NOT DETECTED
STAPHYLOCOCCUS SPECIES: NOT DETECTED
STREPTOCOCCUS AGALACTIAE: NOT DETECTED
STREPTOCOCCUS SPECIES: NOT DETECTED
Serratia marcescens: NOT DETECTED
Staphylococcus aureus (BCID): NOT DETECTED
Streptococcus pneumoniae: NOT DETECTED
Streptococcus pyogenes: NOT DETECTED

## 2016-06-29 LAB — HEPATITIS C ANTIBODY (REFLEX): HCV AB: 0.1 {s_co_ratio} (ref 0.0–0.9)

## 2016-06-29 LAB — PROCALCITONIN: PROCALCITONIN: 0.12 ng/mL

## 2016-06-29 LAB — GLUCOSE, CAPILLARY: Glucose-Capillary: 134 mg/dL — ABNORMAL HIGH (ref 65–99)

## 2016-06-29 LAB — HCV COMMENT:

## 2016-06-29 MED ORDER — POTASSIUM CHLORIDE CRYS ER 20 MEQ PO TBCR
40.0000 meq | EXTENDED_RELEASE_TABLET | Freq: Once | ORAL | Status: AC
  Administered 2016-06-29: 40 meq via ORAL
  Filled 2016-06-29: qty 2

## 2016-06-29 MED ORDER — IPRATROPIUM-ALBUTEROL 0.5-2.5 (3) MG/3ML IN SOLN
3.0000 mL | RESPIRATORY_TRACT | Status: DC
  Administered 2016-06-29 – 2016-06-30 (×5): 3 mL via RESPIRATORY_TRACT
  Filled 2016-06-29 (×5): qty 3

## 2016-06-29 MED ORDER — IPRATROPIUM-ALBUTEROL 0.5-2.5 (3) MG/3ML IN SOLN
3.0000 mL | Freq: Four times a day (QID) | RESPIRATORY_TRACT | Status: DC
  Administered 2016-06-29: 3 mL via RESPIRATORY_TRACT
  Filled 2016-06-29 (×2): qty 3

## 2016-06-29 NOTE — Progress Notes (Addendum)
PHARMACY - PHYSICIAN COMMUNICATION CRITICAL VALUE ALERT - BLOOD CULTURE IDENTIFICATION (BCID)  Results for orders placed or performed during the hospital encounter of 06/27/16  Blood Culture ID Panel (Reflexed) (Collected: 06/27/2016  7:28 PM)  Result Value Ref Range   Enterococcus species NOT DETECTED NOT DETECTED   Listeria monocytogenes NOT DETECTED NOT DETECTED   Staphylococcus species NOT DETECTED NOT DETECTED   Staphylococcus aureus NOT DETECTED NOT DETECTED   Streptococcus species NOT DETECTED NOT DETECTED   Streptococcus agalactiae NOT DETECTED NOT DETECTED   Streptococcus pneumoniae NOT DETECTED NOT DETECTED   Streptococcus pyogenes NOT DETECTED NOT DETECTED   Acinetobacter baumannii NOT DETECTED NOT DETECTED   Enterobacteriaceae species NOT DETECTED NOT DETECTED   Enterobacter cloacae complex NOT DETECTED NOT DETECTED   Escherichia coli NOT DETECTED NOT DETECTED   Klebsiella oxytoca NOT DETECTED NOT DETECTED   Klebsiella pneumoniae NOT DETECTED NOT DETECTED   Proteus species NOT DETECTED NOT DETECTED   Serratia marcescens NOT DETECTED NOT DETECTED   Haemophilus influenzae NOT DETECTED NOT DETECTED   Neisseria meningitidis NOT DETECTED NOT DETECTED   Pseudomonas aeruginosa NOT DETECTED NOT DETECTED   Candida albicans NOT DETECTED NOT DETECTED   Candida glabrata NOT DETECTED NOT DETECTED   Candida krusei NOT DETECTED NOT DETECTED   Candida parapsilosis NOT DETECTED NOT DETECTED   Candida tropicalis NOT DETECTED NOT DETECTED   1/2 blood cultures with GPC - BCID negative.   Name of physician (or Provider) Contacted: Dr. Jari Favre  Changes to prescribed antibiotics required: No change for now. Likely contaminant.   Brain Hilts 06/29/2016  9:23 PM

## 2016-06-29 NOTE — Progress Notes (Signed)
Responded to patients call bell. While While ambulating patient to the bathroom her 02 dropped to 78 on 4L [place]. Audible wheezing heard, and patient states, "she feels shaky like her heart is fluttering". HR is NSR with occasional/frequent PAC's. MD Juleen China notified. Prn albuterol given, order for chest xray. Will continue to monitor.

## 2016-06-29 NOTE — Progress Notes (Signed)
  Subjective: [name] continues to have cough and feel tired and weak today. She feels like she still has some mucus that she is not able to expectorate. She feels that her breathing has improved somewhat. Tmax overnight 100.3. She has not had diarrhea since the day of admission. She has had less of an appetite and only ate a few bites of her breakfast.      Objective:  Vital signs in last 24 hours: Vitals:   06/29/16 0500 06/29/16 0600 06/29/16 0823 06/29/16 0855  BP:  (!) 160/70 139/61   Pulse:   61   Resp:      Temp:   98.2 F (36.8 C) 97.6 F (36.4 C)  TempSrc:   Oral Oral  SpO2: 95% 95% 94%   Weight:      Height:       Physical Exam  Constitutional: She is oriented to person, place, and time. She appears well-developed and well-nourished. No distress.  She appears acutely ill   Cardiovascular: Normal rate and regular rhythm.   No murmur heard. Pulmonary/Chest: Effort normal. No respiratory distress. She has no wheezes. She has rales.  Diffuse crackles over right lung field   Abdominal: Soft. She exhibits no distension. There is no tenderness. There is no guarding.  Neurological: She is alert and oriented to person, place, and time.  Skin: Skin is warm and dry. She is not diaphoretic.   Assessment/Plan: Pt is a 76 y.o. yo female with a PMHx of OSA, HTN, HLD, MDD, arthritis and neuropathy who was admitted on 06/27/2016 with symptoms of cough and shortness of breath and was found to be flu positive.     CAP (community acquired pneumonia)   Sleep apnea   Influenza with pneumonia She continues to have symptoms of cough, lethargy, weakness, and poor appetite. She is sating in the high 90s on 4 liters nasal canula. Pro-calcitonin improved from 0.25 on admission to 0.12 today. The concern is that she may have viral pneumonia with superimposed bacterial pneumonia.  -continue cefriaxone and azithromycin 12/31 >> -Continue tamiflu 1/1 >>  -continue duoneb q4h and mucinex    -follow up legionella antigen  -follow up blood cultures drawn 12/31, no growth to date     Hypertension Currently normotensive. Home medications are metoprolol succinate 50 mg qd and losartan 100 mg qd. Holding metoprolol as her blood pressure is well controlled, this can be restarted if she become hypertensive.  -continue losartan     Depression Will continue to monitor. Continue home medication venlafaxine XR 150 mg daily.   Hypokalemia Resolved  Dispo: Anticipated discharge in approximately 3-5 day(s).   Ledell Noss, MD 06/29/2016, 9:21 AM Pager: [phone]

## 2016-06-30 ENCOUNTER — Inpatient Hospital Stay (HOSPITAL_COMMUNITY): Payer: Medicare Other

## 2016-06-30 DIAGNOSIS — J1100 Influenza due to unidentified influenza virus with unspecified type of pneumonia: Secondary | ICD-10-CM

## 2016-06-30 LAB — CBC
HEMATOCRIT: 35.8 % — AB (ref 36.0–46.0)
HEMOGLOBIN: 11.6 g/dL — AB (ref 12.0–15.0)
MCH: 30.2 pg (ref 26.0–34.0)
MCHC: 32.4 g/dL (ref 30.0–36.0)
MCV: 93.2 fL (ref 78.0–100.0)
Platelets: 206 10*3/uL (ref 150–400)
RBC: 3.84 MIL/uL — ABNORMAL LOW (ref 3.87–5.11)
RDW: 13.6 % (ref 11.5–15.5)
WBC: 6 10*3/uL (ref 4.0–10.5)

## 2016-06-30 LAB — BASIC METABOLIC PANEL
Anion gap: 6 (ref 5–15)
BUN: 6 mg/dL (ref 6–20)
CHLORIDE: 102 mmol/L (ref 101–111)
CO2: 29 mmol/L (ref 22–32)
Calcium: 8.5 mg/dL — ABNORMAL LOW (ref 8.9–10.3)
Creatinine, Ser: 0.78 mg/dL (ref 0.44–1.00)
GFR calc Af Amer: >60 mL/min (ref >60)
GFR calc non Af Amer: >60 mL/min (ref >60)
GLUCOSE: 151 mg/dL — AB (ref 65–99)
POTASSIUM: 3.6 mmol/L (ref 3.5–5.1)
Sodium: 137 mmol/L (ref 135–145)

## 2016-06-30 MED ORDER — METOPROLOL SUCCINATE ER 25 MG PO TB24
25.0000 mg | ORAL_TABLET | Freq: Every day | ORAL | Status: DC
  Administered 2016-06-30: 25 mg via ORAL
  Filled 2016-06-30: qty 1

## 2016-06-30 MED ORDER — IPRATROPIUM-ALBUTEROL 0.5-2.5 (3) MG/3ML IN SOLN
3.0000 mL | Freq: Three times a day (TID) | RESPIRATORY_TRACT | Status: DC
  Administered 2016-06-30 – 2016-07-04 (×11): 3 mL via RESPIRATORY_TRACT
  Filled 2016-06-30 (×9): qty 3

## 2016-06-30 NOTE — Progress Notes (Signed)
  Subjective: [name] continues to experience the symptoms of her respiratory infection today but feels as though her symptoms have improved somewhat since the day of admission. Yesterday afternoon as she was walking to the bathroom she felt the sensation of her heart fluttering, began wheezing and became hypoxemic with SpO2 high 70s. She was brought back to the bed and given an albuterol treatment and felt her breathing improve after that. Repeat portable chest xray at that time showed worsening of her bilateral infiltrates. She was monitored closely overnight and had no further episodes of hypoxemia. Follow up 2 view chest xray this morning showed improvement of her infiltrates.  She remained afebrile overnight and had no further diarrhea.   Objective:  Vital signs in last 24 hours: Vitals:   06/30/16 0814 06/30/16 0839 06/30/16 1223 06/30/16 1310  BP: (!) 142/67  (!) 157/71   Pulse:      Resp: 19  19   Temp: 97.4 F (36.3 C)  97.9 F (36.6 C)   TempSrc: Oral  Oral   SpO2: (!) 88% 92% 93% 95%  Weight:      Height:       Physical Exam  Constitutional: She is oriented to person, place, and time. She appears well-developed and well-nourished. No distress.  She appears acutely ill   Cardiovascular: Normal rate and regular rhythm.   No murmur heard. Pulmonary/Chest: Effort normal. No respiratory distress. She has wheezes. She has rales.  Diffuse crackles and wheeze over both lung field   Abdominal: Soft. She exhibits no distension. There is no tenderness. There is no guarding.  Neurological: She is alert and oriented to person, place, and time.  Skin: Skin is warm and dry. She is not diaphoretic.   Assessment/Plan: Pt is a 76 y.o. yo female with a PMHx of OSA, HTN, HLD, MDD, arthritis and neuropathy who was admitted on 06/27/2016 with symptoms of cough and shortness of breath and was found to be flu positive.     CAP (community acquired pneumonia)   Sleep apnea   Influenza with  pneumonia She continues to have symptoms of cough, lethargy, and weakness. She is sating in the low 90s on 4 liters nasal canula. The concern is that she may have viral pneumonia with superimposed bacterial pneumonia. She remained afebrile overnight, and this morning CXR possible improvement in her pulmonary infiltrates. She has had no leukocytosis. Blood cultures grew GPC in one bottle which is more likely a contaminate. It is concerning that her oxygen saturation has worsened since admission, but it is reassuring that she feels like she is improving. Will continue to monitor her hypoxic respiratory failure in the stepdown unit at this time.  -continue cefriaxone and azithromycin 12/31 >> day 4  -Continue tamiflu 1/1 >> day 3  -continue duoneb q4h and mucinex  -continue albuterol nebs q4h PRN  -follow up legionella antigen, although the abx that she is on would cover her for this -follow blood cultures and speciation, GPC in 1 bottle     Hypertension Currently hypertensive. Home medications are metoprolol succinate 50 mg qd and losartan 100 mg qd. -continue losartan  -restarting metoprolol at 25 mg daily     Depression Will continue to monitor. Continue home medication venlafaxine XR 150 mg daily.   Hypokalemia Resolved  Dispo: Anticipated discharge in approximately 2-4 day(s).   Kathy Noss, MD 06/30/2016, 2:52 PM Pager: [phone]

## 2016-06-30 NOTE — Progress Notes (Signed)
[facility]TS Cross Cover Progress Note  Kathy [name] is a 76 year old female admitted for community-acquired pneumonia and has been found to be influenza A positive. Earlier today she was noted to have significant wheezing with exertion and repeat chest x-ray showed worsening bilateral infiltrates. Patient was evaluated at bedside night per day team request. On evaluation patient was sleeping but easily awakens. She states that she feels slightly better than yesterday especially at rest, but is uncertain how she would do with activity again. States her wheezing has slightly improved with albuterol treatment tonight. She has no acute concerns at this time.   Blood cultures were 1 out of 2 positive for gram-positive cocci in clusters.  Vitals:   06/29/16 2009 06/29/16 2335  BP: (!) 151/95 (!) 143/68  Pulse: 67 72  Resp:  19  Temp: 98.6 F (37 C) 97.9 F (36.6 C)  *Patient on 5 L nasal cannula saturating at 96%  Physical Exam: Constitutional: NAD, vital signs reviewed CV: Regular rate and rhythm, no murmurs, rubs, or gallops appreciated Respiratory: Mild anterior wheezing-improved from yesterday; diffuse bilateral crackles right greater than left, no significant posterior wheezing appreciated Extremities: Warm, pulses intact, no edema  Assessment & Plan: Patient in stable respiratory condition at this time slight improvement from last nights evaluation. Per patient she feels better than earlier today which is reassuring and pro-calcitonin is decreasing. Chest x-ray does not appear to be consistent with MRSA pneumonia and patient seems to be improving on current management. We'll hold off on changing antibiotic therapy at this time. -Continue current management -Follow-up speciation and sensitivities  Alphonzo Grieve, MD [facility] - PGY1 Pager [phone]

## 2016-06-30 NOTE — Progress Notes (Signed)
Internal Medicine Attending:   I saw and examined the patient. I reviewed the resident's note and I agree with the resident's findings and plan as documented in the resident's note.  Patient continues to improve and states her SOB is slowly improving. Still with episodes of desaturation on ambulation. Would continue to monitor in SDU for now. C/w tamiflu for Influenza A. C/w ceftriaxone and azithromycin for likely superimposed bacterial PNA. C/w nebs and O2 via [place] prn. If continues to improve may transfer out of SDU in AM

## 2016-06-30 NOTE — Care Management Important Message (Signed)
Important Message  Patient Details  Name: Kathy [name] MRN: [id number] Date of Birth: [birth year]   Medicare Important Message Given:  Yes    Lacretia Leigh, RN 06/30/2016, 10:46 AM

## 2016-07-01 DIAGNOSIS — B002 Herpesviral gingivostomatitis and pharyngotonsillitis: Secondary | ICD-10-CM

## 2016-07-01 DIAGNOSIS — B001 Herpesviral vesicular dermatitis: Secondary | ICD-10-CM

## 2016-07-01 LAB — CULTURE, BLOOD (ROUTINE X 2)

## 2016-07-01 LAB — PROCALCITONIN

## 2016-07-01 MED ORDER — VALACYCLOVIR HCL 500 MG PO TABS
1000.0000 mg | ORAL_TABLET | Freq: Three times a day (TID) | ORAL | Status: DC
  Administered 2016-07-01: 1000 mg via ORAL
  Filled 2016-07-01 (×3): qty 2

## 2016-07-01 MED ORDER — METOPROLOL SUCCINATE ER 50 MG PO TB24
50.0000 mg | ORAL_TABLET | Freq: Every day | ORAL | Status: DC
  Administered 2016-07-01 – 2016-07-04 (×4): 50 mg via ORAL
  Filled 2016-07-01 (×4): qty 1

## 2016-07-01 MED ORDER — VALACYCLOVIR HCL 500 MG PO TABS
1000.0000 mg | ORAL_TABLET | Freq: Two times a day (BID) | ORAL | Status: AC
  Administered 2016-07-01 – 2016-07-02 (×2): 1000 mg via ORAL
  Filled 2016-07-01: qty 2

## 2016-07-01 NOTE — Progress Notes (Signed)
  Subjective: [name] continues to experience cough and shortness of breath but feels that her symptoms are improving significantly. Today she was found sitting up in bed eating breakfast. She remained afebrile overnight with SpO2 92-97% on 4 liters.   Objective:  Vital signs in last 24 hours: Vitals:   07/01/16 0300 07/01/16 0830 07/01/16 0929 07/01/16 1115  BP: (!) 165/62 (!) 135/47  (!) 167/68  Pulse:  72    Resp: 20 17  (!) 22  Temp: 97.7 F (36.5 C) 98.6 F (37 C)  97.6 F (36.4 C)  TempSrc: Oral Oral  Oral  SpO2: 94% 97% 93% 90%  Weight: 242 lb 3.2 oz (109.9 kg)     Height:       Physical Exam  Constitutional: She is oriented to person, place, and time. She appears well-developed and well-nourished. No distress.  Cardiovascular: Normal rate and regular rhythm.   No murmur heard. Pulmonary/Chest: Effort normal. No respiratory distress. She has wheezes. She has rales.  Diffuse crackles and wheeze over both lung field, improving   Abdominal: Soft. She exhibits no distension. There is no tenderness. There is no guarding.  Neurological: She is alert and oriented to person, place, and time.  Skin: Skin is warm and dry. She is not diaphoretic.   Assessment/Plan: Pt is a 76 y.o. yo female with a PMHx of OSA, HTN, HLD, MDD, arthritis and neuropathy who was admitted on 06/27/2016 with symptoms of cough and shortness of breath and was found to be flu positive.   Patient Active Problem List   Diagnosis Date Noted  . Oral herpes simplex infection   . Influenza with pneumonia 06/28/2016  . CAP (community acquired pneumonia) 06/27/2016  . Hypertension 06/27/2016  . Depression 06/27/2016  . Sleep apnea 06/27/2016     CAP (community acquired pneumonia)   Sleep apnea   Influenza with pneumonia Symptoms of cough, lethargy, and weakness are improving and her appetite is improving. She is sating in the mid 90s on 4 liters [address]. She remained afebrile overnight and lung exam sounds  better today. Today she will have receive her 5th dose of antibiotics for CAP coverage. Will d/c antibiotics after today's dose , continue tamiflu and transfer out of the stepdown.  -d/c cefriaxone and azithromycin 12/31 >> day 5  -Continue tamiflu 1/1 >> day 4  -continue duoneb q4h and mucinex  -continue albuterol nebs q4h PRN  -follow up legionella antigen -follow blood cultures and speciation, GPC in 1 bottle  Oral herpes infection  Has crusted lesion over lip and blisters on her cheek which look like oral herpes lesions.  - ordered acyclovir 1g BID for 1 day     Hypertension Currently normotensive  -continue home medications  metoprolol succinate 50 mg qd and losartan 100 mg qd.    Depression Will continue to monitor. Continue home medication venlafaxine XR 150 mg daily.   Hypokalemia Resolved  Dispo: Anticipated discharge in approximately 2-4 day(s).   Ledell Noss, MD 07/01/2016, 1:19 PM Pager: [phone]

## 2016-07-01 NOTE — Plan of Care (Signed)
Problem: Respiratory: Goal: Respiratory status will improve Outcome: Progressing Pt remains on 4L [place], sats mid-90s. Desats to low 80s on exertion, but recovers well given some rest time.

## 2016-07-01 NOTE — Progress Notes (Signed)
Pt arrived on unit. Pt in stable condition. Assessment complete, pt oriented to unit, and callbell within reach. Will continue to monitor.

## 2016-07-01 NOTE — Evaluation (Signed)
Physical Therapy Evaluation Patient Details Name: Kathy [name] MRN: [id number] DOB: 1941-04-24 Today's Date: 07/01/2016   History of Present Illness  Pt adm with PNA and flu+. PMH - neuropathy, HTN, OSA, arthritis, obesity  Clinical Impression  Pt admitted with above diagnosis and presents to PT with functional limitations due to deficits listed below (See PT problem list). Pt needs skilled PT to maximize independence and safety to allow discharge to home with husband. Pt with very sedentary lifestyle prior to illness and now with decr SpO2 even on 4L of O2 with minimal exertion.      Follow Up Recommendations Home health PT;Supervision - Intermittent    Equipment Recommendations  Other (comment) (To be assessed)    Recommendations for Other Services       Precautions / Restrictions Precautions Precautions: Fall Restrictions Weight Bearing Restrictions: No      Mobility  Bed Mobility Overal bed mobility: Modified Independent             General bed mobility comments: Incr time and effort  Transfers Overall transfer level: Needs assistance Equipment used: None Transfers: Sit to/from Stand Sit to Stand: Min guard         General transfer comment: Assist for safety  Ambulation/Gait Ambulation/Gait assistance: Min guard Ambulation Distance (Feet): 75 Feet Assistive device: None Gait Pattern/deviations: Step-through pattern;Decreased stride length Gait velocity: decr Gait velocity interpretation: Below normal speed for age/gender General Gait Details: slightly unsteady but no loss of balance. SpO2 81% on 4L with amb. Required 2 minutes sitting rest to return to >90%  Stairs            Wheelchair Mobility    Modified Rankin (Stroke Patients Only)       Balance Overall balance assessment: Needs assistance Sitting-balance support: No upper extremity supported;Feet supported Sitting balance-Leahy Scale: Good     Standing balance support: No upper  extremity supported;During functional activity Standing balance-Leahy Scale: Fair                               Pertinent Vitals/Pain Pain Assessment: No/denies pain    Home Living Family/patient expects to be discharged to:: Private residence Living Arrangements: Spouse/significant other Available Help at Discharge: Family Type of Home: House Home Access: Level entry     Home Layout: One level Home Equipment: None      Prior Function Level of Independence: Independent         Comments: Very sedentary. Household ambulator primarily. Doesn't go to stores. Can amb into MD office.     Hand Dominance        Extremity/Trunk Assessment   Upper Extremity Assessment Upper Extremity Assessment: Generalized weakness    Lower Extremity Assessment Lower Extremity Assessment: Generalized weakness       Communication   Communication: No difficulties  Cognition Arousal/Alertness: Awake/alert Behavior During Therapy: WFL for tasks assessed/performed Overall Cognitive Status: Within Functional Limits for tasks assessed                      General Comments      Exercises     Assessment/Plan    PT Assessment Patient needs continued PT services  PT Problem List Decreased strength;Decreased activity tolerance;Decreased balance;Decreased mobility;Decreased knowledge of use of DME;Cardiopulmonary status limiting activity;Obesity          PT Treatment Interventions DME instruction;Gait training;Functional mobility training;Therapeutic activities;Therapeutic exercise;Balance training;Patient/family education    PT Goals (Current  goals can be found in the Care Plan section)  Acute Rehab PT Goals Patient Stated Goal: return home PT Goal Formulation: With patient Time For Goal Achievement: 07/08/16 Potential to Achieve Goals: Good    Frequency Min 3X/week   Barriers to discharge        Co-evaluation               End of Session  Equipment Utilized During Treatment: Gait belt;Oxygen Activity Tolerance: Patient limited by fatigue Patient left: in bed;with call bell/phone within reach (sitting EOB)           Time: [id number] PT Time Calculation (min) (ACUTE ONLY): 14 min   Charges:         PT G CodesShary Decamp [place] 2016/07/15, 3:16 PM [employer] PT [phone]

## 2016-07-01 NOTE — Progress Notes (Signed)
Medicine attending: I examined this patient today together with resident physician Dr. Ledell Noss and I concur with her evaluation and management plan which we discussed together and which will be detailed in her subsequent progress note. Frail 76 year old woman with hypertension and obstructive sleep apnea who presented on the day of admission 06/27/2016 with a one-week history of increasing dyspnea, nonproductive cough, rhinorrhea, and diffuse myalgias. On initial exam she had a low-grade temperature 99.7, she was mildly dyspneic, she was hypoxic with oxygen saturations recorded as low as 77%, there were bilateral rales over the lung bases right greater than left. There were diffuse wheezes over both lungs with a prolonged expiratory phase. Chest x-ray with bilateral diffuse pulmonary infiltrates left greater than right. Influenza A positive by PCR. White count 8800 with 68% neutrophils. Initial lactic acid 3.0.  She was started on Tamiflu but in view of her frail status and elevated lactic acid, empiric antibiotics to cover a community-acquired pneumonia were initiated. She is slowly improved. However, on my exam today, she has diffuse, extensive, dry rales over both lungs. She has developed herpetic lesions on her lower lip. Follow-up chest radiograph done on January 3 which I personally reviewed continues to show bilateral interstitial pulmonary infiltrates. My impression is that she has viral pneumonia. In addition, herpes simplex infection. If Legionella urinary antigen is negative, we will stop antibacterial antibiotics. Continue Tamiflu. Add acyclovir. Renal function is normal.

## 2016-07-01 NOTE — Progress Notes (Signed)
Received report on pt.

## 2016-07-02 DIAGNOSIS — J129 Viral pneumonia, unspecified: Secondary | ICD-10-CM

## 2016-07-02 DIAGNOSIS — J1108 Influenza due to unidentified influenza virus with specified pneumonia: Secondary | ICD-10-CM

## 2016-07-02 DIAGNOSIS — Z9981 Dependence on supplemental oxygen: Secondary | ICD-10-CM

## 2016-07-02 LAB — CULTURE, BLOOD (ROUTINE X 2): CULTURE: NO GROWTH

## 2016-07-02 NOTE — Progress Notes (Signed)
  Subjective: [name] feeling better today. Cough and decreased appetite have improved. Still feels short of breath and wheeze when exerting herself. She was afebrile overnight and denies any new concerns or complaints.   Objective:  Vital signs in last 24 hours: Vitals:   07/02/16 0459 07/02/16 0941 07/02/16 1007 07/02/16 1318  BP: (!) 157/70  (!) 149/68   Pulse: 72  76   Resp: 19     Temp: 97.6 F (36.4 C)     TempSrc: Oral     SpO2: 90% 95%  91%  Weight: 241 lb 3.2 oz (109.4 kg)     Height:       Physical Exam  Constitutional: She is oriented to person, place, and time. She appears well-developed and well-nourished. No distress.  Cardiovascular: Normal rate and regular rhythm.   No murmur heard. Pulmonary/Chest: Effort normal. No respiratory distress. She has no wheezes. She has rales.  Diffuse crackles over both lung field, improving   Abdominal: Soft. She exhibits no distension. There is no tenderness. There is no guarding.  Neurological: She is alert and oriented to person, place, and time.  Skin: Skin is warm and dry. She is not diaphoretic.   Assessment/Plan: Pt is a 76 y.o. yo female with a PMHx of OSA, HTN, HLD, MDD, arthritis and neuropathy who was admitted on 06/27/2016 with symptoms of cough and shortness of breath and was found to be flu positive.   Patient Active Problem List   Diagnosis Date Noted  . Oral herpes simplex infection   . Influenza with pneumonia 06/28/2016  . CAP (community acquired pneumonia) 06/27/2016  . Hypertension 06/27/2016  . Depression 06/27/2016  . Sleep apnea 06/27/2016     CAP (community acquired pneumonia)   Sleep apnea   Influenza with pneumonia She feels like her symptoms of cough and decreased appetite have improved today. Lung exam improved today and she looks better constitutionally but still has oxygen saturation in the low 90s on 4 L. She was ambulated around her room today and became hypoxic with SpO2 83% when [place] was  removed. She will need home oxygen at discharge. PT eval recommended home health PT. Today she will have receive her final dose of tamiflu. -Complete tamiflu 1/1 >> day 5 -continue duoneb q4h and mucinex  -continue albuterol nebs q4h PRN  -follow up legionella antigen  Oral herpes infection  Lesions crusted and stable s/p 2 doses of acyclovir     Hypertension -continue home medications  metoprolol succinate 50 mg qd and losartan 100 mg qd.    Depression Will continue to monitor. Continue home medication venlafaxine XR 150 mg daily.   Hypokalemia Resolved  Dispo: Anticipated discharge in approximately 2-4 day(s).   Ledell Noss, MD 07/02/2016, 1:44 PM Pager: [phone]

## 2016-07-02 NOTE — Progress Notes (Signed)
Physical Therapy Treatment Patient Details Name: Kathy [name] MRN: [id number] DOB: [birth year] Today's Date: 07/02/2016    History of Present Illness Pt adm with PNA and flu+. PMH - neuropathy, HTN, OSA, arthritis, obesity    PT Comments    Pt is up to walk with assistance, and has been notably SOB and wheezing with the effort.  Her O2 sats are down to 62 briefly and recovered to 83% quickly, but slow to go from there.  Was on O2 for entire session and will need to be assisted at home in the future as she first returns, but then outpatient for endurance training should be helpful.  Continue acute therapy as planned.  Follow Up Recommendations  Home health PT;Supervision - Intermittent     Equipment Recommendations  None recommended by PT    Recommendations for Other Services       Precautions / Restrictions Precautions Precautions: Fall (telemetry and pulse ox on R index) Precaution Comments: pulse ox not working Restrictions Weight Bearing Restrictions: No    Mobility  Bed Mobility Overal bed mobility: Modified Independent             General bed mobility comments: Incr time and effort  Transfers Overall transfer level: Needs assistance Equipment used: None Transfers: Sit to/from [facility] Sit to Stand: Min guard Stand pivot transfers: Min guard       General transfer comment: supervised and assisted due to SOB  Ambulation/Gait Ambulation/Gait assistance: Min guard Ambulation Distance (Feet): 60 Feet Assistive device: None;1 person hand held assist Gait Pattern/deviations: Step-through pattern;Decreased stride length;Trunk flexed;Wide base of support Gait velocity: reduced Gait velocity interpretation: Below normal speed for age/gender General Gait Details: able to maintain her balance but wheezing by end of gait   Stairs            Wheelchair Mobility    Modified Rankin (Stroke Patients Only)       Balance     Sitting  balance-Leahy Scale: Good       Standing balance-Leahy Scale: Fair                      Cognition Arousal/Alertness: Awake/alert Behavior During Therapy: WFL for tasks assessed/performed Overall Cognitive Status: Within Functional Limits for tasks assessed                      Exercises      General Comments        Pertinent Vitals/Pain Pain Assessment: No/denies pain    Home Living                      Prior Function            PT Goals (current goals can now be found in the care plan section) Acute Rehab PT Goals Patient Stated Goal: return home Progress towards PT goals: Progressing toward goals    Frequency    Min 3X/week      PT Plan Current plan remains appropriate    Co-evaluation             End of Session Equipment Utilized During Treatment: Gait belt;Oxygen Activity Tolerance: Patient limited by fatigue;Treatment limited secondary to medical complications (Comment) (O2 sats after walking) Patient left: in bed;with call bell/phone within reach;with bed alarm set     Time: [id number] PT Time Calculation (min) (ACUTE ONLY): 19 min  Charges:  $Gait Training: 8-22 mins  G Codes:      Ramond Dial 07/02/2016, 1:37 PM   Mee Hives, PT MS Acute Rehab Dept. Number: [place] [facility] [place]

## 2016-07-02 NOTE — Discharge Summary (Signed)
Name: Kathy [name] MRN: [id number] DOB: 07-06-1940 76 y.o. PCP: No Pcp Per Patient  Date of Admission: 06/27/2016  6:07 PM Date of Discharge: 07/04/2016 Attending Physician: Dr. Murriel Hopper  Discharge Diagnosis: 1.    CAP (community acquired pneumonia)    Discharge Medications: Allergies as of 07/04/2016      Reactions   Lipitor [atorvastatin] Other (See Comments)   Memory issues      Medication List    STOP taking these medications   oxyCODONE-acetaminophen 5-325 MG tablet Commonly known as:  PERCOCET   predniSONE 10 MG (21) Tbpk tablet Commonly known as:  STERAPRED UNI-PAK 21 TAB   traMADol 50 MG tablet Commonly known as:  ULTRAM     TAKE these medications   albuterol 108 (90 Base) MCG/ACT inhaler Commonly known as:  PROVENTIL HFA;VENTOLIN HFA Inhale 1-2 puffs into the lungs every 6 (six) hours as needed for wheezing or shortness of breath.   benzonatate 100 MG capsule Commonly known as:  TESSALON Take 1 capsule (100 mg total) by mouth every 8 (eight) hours.   fenofibrate micronized 134 MG capsule Commonly known as:  LOFIBRA Take 134 mg by mouth daily before breakfast.   furosemide 80 MG tablet Commonly known as:  LASIX Take 80 mg by mouth every morning.   losartan 100 MG tablet Commonly known as:  COZAAR Take 100 mg by mouth daily.   meloxicam 15 MG tablet Commonly known as:  MOBIC Take 15 mg by mouth daily.   metoprolol succinate 50 MG 24 hr tablet Commonly known as:  TOPROL-XL Take 50 mg by mouth daily.   rOPINIRole 1 MG tablet Commonly known as:  REQUIP Take 1 mg by mouth every evening.   venlafaxine XR 150 MG 24 hr capsule Commonly known as:  EFFEXOR-XR Take 150 mg by mouth daily.       Disposition and follow-up:   [name] was discharged from [facility] in Good condition.  At the hospital follow up visit please address:  1.  Influenza and Community acquired pneumonia: Has her hypoxia improved?  Does she  still require home oxygen?   2.  Labs / imaging needed at time of follow-up: none   3.  Pending labs/ test needing follow-up: none  Follow-up Appointments: Follow-up Information    [place]. Schedule an appointment as soon as possible for a visit.   Why:  for hospital follow up in about 1 week. Contact information: [address] [place] Follow up.   Why:  home health physical therapy, nurse and home oxygen Contact information: [place] [address] [phone]           Hospital Course by problem list:   Influenza with pneumonia   Community acquired pneumonia 76 year old woman with PMH OSA, hypertension, hyperlipidemia, arthritis who presents with worsening shortness of breath, nonproductive cough, and rhinorrhea for one week. She had gone to urgent care and was prescribed symptomatic management with prednisone for possibleviral URI. In the ED she was found to be hypoxic in the 70s which improved when she was placed on non rebreather. Initial temp 100.3 and chest xray revealed bilateral infiltrates consistent with pneumonia. She tested positive for influenza and was started on tamiflu, azithromycin, and ceftriaxone for influenza pneumonia and empiric treatment for community acquired pneumonia. She had slow clinical improvement of her symptoms and remained  in the stepdown unit for monitoring of her hypoxia. She remained hypoxic with SpO2 in the low 80s when ambulating without nasal canula. On the day of discharge she had SpO2 low- mid 90s on 2 liters and was discharged with home oxygen.     Hypertension Remained normotensive on home medications metoprolol succinate and losartan.     Oral herpes simplex infection Developed pustular lesions on her cheek which became encrusted and improved with acyclovir.   Discharge Vitals:   BP 112/85 (BP Location: Left  Arm)   Pulse (!) 59   Temp 98.3 F (36.8 C) (Oral)   Resp 18   Ht 5\' 1"  (1.549 m)   Wt 239 lb 3.2 oz (108.5 kg)   SpO2 (!) 83%   BMI 45.20 kg/m   Pertinent Labs, Studies, and Procedures:  Procedures Performed:  Dg Chest 2 View  Result Date: 06/30/2016 CLINICAL DATA:  Cough.  Pneumonia EXAM: CHEST  2 VIEW COMPARISON:  07/09/2016 FINDINGS: Heart size is normal. No pleural effusion identified. Increased lung volumes compared with previous exam Right upper lobe and left lung airspace opacities are again identified. There is slightly improved aeration to both lungs. IMPRESSION: Mild improvement in bilateral, multifocal airspace opacities. Electronically Signed   By: Kerby Moors M.D.   On: 06/30/2016 09:18   Dg Chest 2 View  Result Date: 06/28/2016 CLINICAL DATA:  76 year old female with fever shortness of breath and cough. Community-acquired pneumonia. Initial encounter. EXAM: CHEST  2 VIEW COMPARISON:  06/27/2016 and earlier. FINDINGS: PA and lateral views of the chest. Improved lung volumes. Improved bilateral ventilation, but significant residual widespread Patchy and confluent peribronchial opacity. The majority of the left lung is affected. On the right the upper lobe primarily is affected. No superimposed pneumothorax or pleural effusion. Stable cardiac size and mediastinal contours. Calcified aortic atherosclerosis. No acute osseous abnormality identified. Stable cholecystectomy clips. Negative visible bowel gas pattern. Sequelae of ventral abdominal hernia repair partially visible. IMPRESSION: Bilateral pneumonia/bronchopneumonia left greater than right with improved ventilation since yesterday. No pleural effusion or new cardiopulmonary abnormality. Electronically Signed   By: Genevie Ann M.D.   On: 06/28/2016 07:50   Dg Chest Portable 1 View  Result Date: 06/27/2016 CLINICAL DATA:  Shortness of breath. EXAM: PORTABLE CHEST 1 VIEW COMPARISON:  07/31/2014 FINDINGS: Heart size is normal.  There is widespread bronchopneumonia throughout both lungs, left more than right. No dense consolidation or lobar collapse. No effusions. No acute bone finding. IMPRESSION: Widespread bilateral bronchopneumonia left more than right. Electronically Signed   By: Nelson Chimes M.D.   On: 06/27/2016 18:48   Dg Knee Complete 4 Views Left  Result Date: 06/22/2016 CLINICAL DATA:  Chronic left knee pain EXAM: LEFT KNEE - COMPLETE 4+ VIEW COMPARISON:  None. FINDINGS: There is no joint effusion. Severe tricompartment osteoarthritis is noted with marked joint space narrowing and exuberant marginal spur formation. No fracture or subluxation. IMPRESSION: 1. Severe tricompartment osteoarthritis. 2. No acute findings. Electronically Signed   By: Kerby Moors M.D.   On: 06/22/2016 15:50   Dg Chest Port 1 View  Result Date: 06/29/2016 CLINICAL DATA:  Shortness of Breath, hypertension EXAM: PORTABLE CHEST 1 VIEW COMPARISON:  06/28/2016 FINDINGS: Patchy airspace disease throughout the lungs bilaterally, worsening since prior study. Elevation of the right hemidiaphragm. Heart is borderline in size. IMPRESSION: Worsening patchy bilateral airspace disease, left greater than right. Elevated right hemidiaphragm. Electronically Signed   By: Rolm Baptise M.D.   On: 06/29/2016 16:19  Discharge Instructions: Discharge Instructions    Diet - low sodium heart healthy    Complete by:  As directed    Discharge instructions    Complete by:  As directed    [name],  It was a pleasure taking care of you while in the hospital.  You were treated for pneumonia complicated by having the influenza virus.  Your lungs are still recovering and we are sending you home with oxygen to use until they have fully recovered.  We will also provide home health services from the recommendations of our therapists.  Please schedule a hospital follow up in about 1 week with our [facility] to see how you are doing.  I will also send  them an email to reach out to you for an appointment.  Take care.   Increase activity slowly    Complete by:  As directed       Signed: Ledell Noss, MD 07/08/2016, 7:08 PM   Pager: [phone]

## 2016-07-02 NOTE — Progress Notes (Signed)
Medicine attending: I examined this patient today together with resident physician Dr. Ledell Noss and I concur with her evaluation and management plan which we discussed together. She is recovering from the flu but still has diffuse rales throughout both lungs consistent with viral pneumonitis. She is still requiring supplemental oxygen to maintain adequate oxygenation. She completes 5 days of Tamiflu today. Herpetic lesions on the lower lip and left cheek are healing on acyclovir. At this point it looks like if we cannot wean her off the oxygen that we will need to arrange home oxygen at least on a temporary basis until her lungs heal.

## 2016-07-02 NOTE — Progress Notes (Signed)
SATURATION QUALIFICATIONS: (This note is used to comply with regulatory documentation for home oxygen)  Patient Saturations on Room Air at Rest = 87%  Patient Saturations on Room Air while Ambulating = 83%  Patient Saturations on 3 Liters of oxygen while Ambulating = 94%  Please briefly explain why patient needs home oxygen: SpO2 drops when not on oxygen.

## 2016-07-03 LAB — LEGIONELLA PNEUMOPHILA SEROGP 1 UR AG: L. PNEUMOPHILA SEROGP 1 UR AG: NEGATIVE

## 2016-07-03 MED ORDER — IBUPROFEN 400 MG PO TABS
800.0000 mg | ORAL_TABLET | Freq: Four times a day (QID) | ORAL | Status: DC | PRN
  Administered 2016-07-03 – 2016-07-04 (×2): 800 mg via ORAL
  Filled 2016-07-03 (×2): qty 2

## 2016-07-03 NOTE — Progress Notes (Addendum)
  Subjective: [name] feeling better today although does not feel like she is ready for discharge yet.  She is concerned with being a burden to her husband, Kathy [name].  She overall feels that she is improving day-to-day.  Objective:  Vital signs in last 24 hours: Vitals:   07/02/16 2123 07/02/16 2128 07/03/16 0512 07/03/16 0736  BP: (!) 156/67  (!) 155/70   Pulse: 70  68   Resp: 18  18   Temp: 98.6 F (37 C)  98.3 F (36.8 C)   TempSrc: Oral  Oral   SpO2: 96% 96% 94% 96%  Weight:      Height:       Physical Exam  Constitutional: She is oriented to person, place, and time. She appears well-developed and well-nourished. No distress.  Pulmonary/Chest: Effort normal.  Neurological: She is alert and oriented to person, place, and time.  Skin: Skin is warm and dry. She is not diaphoretic.   Assessment/Plan: Pt is a 76 y.o. yo female with a PMHx of OSA, HTN, HLD, MDD, arthritis and neuropathy who was admitted on 06/27/2016 with symptoms of cough and shortness of breath and was found to be flu positive.   Patient Active Problem List   Diagnosis Date Noted  . Oral herpes simplex infection   . Influenza with pneumonia 06/28/2016  . CAP (community acquired pneumonia) 06/27/2016  . Hypertension 06/27/2016  . Depression 06/27/2016  . Sleep apnea 06/27/2016     CAP (community acquired pneumonia)   Sleep apnea   Influenza with pneumonia She continues to improve on a daily basis and is feeling better although not quite well enough to return home yet.  She still has an oxygen requirement that was not present prior to admission.  She had documented hypoxia with ambulation and will require home oxygen upon discharge.  She has completed course of Tamiflu for positive influenza PCR.  PT recommending [place] PT.  She is now down to 2 liters of [place] and appears comfortable on exam. -continue duoneb TID and mucinex  -continue albuterol nebs q4h PRN  -legionella antigen still in process  Oral herpes  infection  Lesions crusted and stable s/p 2 doses of acyclovir     Hypertension -continue home medications  metoprolol succinate 50 mg qd and losartan 100 mg qd.    Depression Will continue to monitor. Continue home medication venlafaxine XR 150 mg daily.   Dispo: Anticipated discharge in approximately 2 days.   Jule Ser, DO 07/03/2016, 9:15 AM Pager: [phone]

## 2016-07-03 NOTE — Progress Notes (Signed)
Notified MD about a "catching pain" in pts left side. MD ordered ibuprofen 800 mg PRN Q6. Will continue to monitor.

## 2016-07-03 NOTE — Progress Notes (Signed)
CCMD notified of short string of SVT. Pt assessed. Will continue to monitor.

## 2016-07-03 NOTE — Progress Notes (Signed)
Medicine attending: Clinical status and ancillary data reviewed in detail with resident physician Dr. Jule Ser and I concur with his evaluation and management plan. Flu symptoms continue to improve. However, she remains oxygen dependent. We will continue symptomatic treatment. Make arrangements for home oxygen. Anticipate discharge soon.

## 2016-07-04 LAB — BASIC METABOLIC PANEL
ANION GAP: 6 (ref 5–15)
BUN: 7 mg/dL (ref 6–20)
CO2: 28 mmol/L (ref 22–32)
Calcium: 8.5 mg/dL — ABNORMAL LOW (ref 8.9–10.3)
Chloride: 103 mmol/L (ref 101–111)
Creatinine, Ser: 0.67 mg/dL (ref 0.44–1.00)
GFR calc non Af Amer: >60 mL/min (ref >60)
Glucose, Bld: 97 mg/dL (ref 65–99)
Potassium: 4.5 mmol/L (ref 3.5–5.1)
Sodium: 137 mmol/L (ref 135–145)

## 2016-07-04 LAB — CBC
HCT: 33.3 % — ABNORMAL LOW (ref 36.0–46.0)
HEMOGLOBIN: 10.9 g/dL — AB (ref 12.0–15.0)
MCH: 30.6 pg (ref 26.0–34.0)
MCHC: 32.7 g/dL (ref 30.0–36.0)
MCV: 93.5 fL (ref 78.0–100.0)
Platelets: 286 10*3/uL (ref 150–400)
RBC: 3.56 MIL/uL — AB (ref 3.87–5.11)
RDW: 13.5 % (ref 11.5–15.5)
WBC: 6.5 10*3/uL (ref 4.0–10.5)

## 2016-07-04 NOTE — Progress Notes (Signed)
  Subjective: [name] feeling better today and is ready to go home.  She has no acute complaints.  Agreeable to home with home health RN and PT as well as temporary home oxygen.  Objective:  Vital signs in last 24 hours: Vitals:   07/03/16 2045 07/03/16 2123 07/04/16 0544 07/04/16 0937  BP:  (!) 135/57 114/74   Pulse:  60 (!) 55   Resp:  18 17   Temp:  97.9 F (36.6 C) 97.6 F (36.4 C)   TempSrc:  Rectal Oral   SpO2: 92% 94% 95% 90%  Weight:   239 lb 3.2 oz (108.5 kg)   Height:       Physical Exam  Constitutional: She is oriented to person, place, and time. She appears well-developed and well-nourished. No distress.  On 2 liters via [place].  Cardiovascular: Normal rate and regular rhythm.   Pulmonary/Chest: Effort normal. No respiratory distress.  Coarse crackles bilaterally.  Musculoskeletal: She exhibits no edema.  Neurological: She is alert and oriented to person, place, and time.  Skin: Skin is warm and dry. She is not diaphoretic.   Assessment/Plan: Pt is a 76 y.o. yo female with a PMHx of OSA, HTN, HLD, MDD, arthritis and neuropathy who was admitted on 06/27/2016 with symptoms of cough and shortness of breath and was found to be flu positive.   Patient Active Problem List   Diagnosis Date Noted  . Oral herpes simplex infection   . Influenza with pneumonia 06/28/2016  . CAP (community acquired pneumonia) 06/27/2016  . Hypertension 06/27/2016  . Depression 06/27/2016  . Sleep apnea 06/27/2016     CAP (community acquired pneumonia)   Sleep apnea   Influenza with pneumonia She has continued to improve on a daily basis and is feeling better and stable for discharge to home.  Given her documented hypoxia, we will order home oxygen temporarily as well as home health PT and RN.  She has completed her course of Tamiflu. - Discharge home today.  Oral herpes infection  Lesions crusted and stable s/p 2 doses of acyclovir     Hypertension -continue home medications   metoprolol succinate 50 mg qd and losartan 100 mg qd.    Depression Will continue to monitor. Continue home medication venlafaxine XR 150 mg daily.   Dispo: Anticipated discharge today.  Jule Ser, DO 07/04/2016, 10:40 AM Pager: [phone]

## 2016-07-04 NOTE — Care Management Note (Addendum)
Case Management Note  Patient Details  Name: Kathy [name] MRN: [id number] Date of Birth: [birth year]  Subjective/Objective:                  worsening shortness of breath Action/Plan: Discharge planning Expected Discharge Date:  07/04/16               Expected Discharge Plan:  [place]  In-House Referral:     Discharge planning Services  CM Consult  Post Acute Care Choice:  Home Health Choice offered to:  Patient  DME Arranged:  Oxygen DME Agency:  [place] Arranged:  RN, PT [facility] Agency:  [place]  Status of Service:  Completed, signed off  If discussed at [place] of Stay Meetings, dates discussed:    Additional Comments: CM spoke with pt to offer choice of home health agency. Pt chooses [facility] to render HHPT/RN. Cm notes no PCP listed. Pt states she is seen at [facility] [phone] and last person she was seen by is "[name]" b ut she does not remember last name; she wishes to stay with practice.  Referral called to [facility] rep, Jermaine with PCP info and request for Home O2 arrangement.  CM notified [place] DME rep, Reggie to please deliver O2 tank to room for transport home.  RN, Danae Chen aware to wait for transport tank to be delivered to room prior to discharge.  No other CM needs were communicated. Dellie Catholic, RN 07/04/2016, 12:09 PM

## 2016-07-04 NOTE — Discharge Summary (Signed)
Medicine attending discharge note: I personally examined this patient today together with resident physician Dr. Jule Ser and I attests to the accuracy of his discharge evaluation and plan as recorded in the final progress note dated 07/04/2016.  Clinical summary: 76 year old woman who presented on the day of admission 06/27/2016 with a one-week history of progressive nonproductive cough and dyspnea 9 subsequent chest pressure not responsive to outpatient treatment with prednisone and albuterol. No fever or myalgia. She has some intermittent diarrhea. On presentation to the emergency department she was hypoxic with oxygen saturation in the 70s which improved when she was placed on nonrebreather oxygen mask. Oxygen saturation rose to 98%. Initial temperature 100.3 degrees. Wheezing heard over the anterior lungs. Bilateral rales over the entire right hemithorax and at the left base. Chest x-ray showed bilateral pneumonia with diffuse disease throughout both lungs left greater than right. She tested positive for influenza A.  Hospital course: She received a 5 day course of Tamiflu. In view of her age and overall frail status, she did get a course of empiric antibiotics although this appeared to be primarily a viral pneumonia. She remained oxygen dependent for the entire hospital stay. She had persistent, diffuse, dry rales throughout both lungs consistent with ongoing inflammation due to the viral pneumonia. She developed herpetic lesions on her lower lip and left cheek and acyclovir was added to her regimen. These lesions had cleared significantly by discharge. Oxygenation remained borderline with saturation as low as 83% on 2 L of oxygen at time of discharge and home oxygen will be arranged.  Disposition: Conditions stable enough for discharge Physical exam of her lungs remains abnormal and she remains hypoxic and will require home oxygen for an indefinite amount of time until lungs heal. There  were no complications

## 2016-07-04 NOTE — Progress Notes (Addendum)
Pt ambulated around nurses station. Oxygen sat at 88% room air at rest prior to walking. Pt c/o SOB while ambulating. Oxygen increased to 2L, pt sating at 92%.

## 2016-07-04 NOTE — Progress Notes (Signed)
Orders placed for CM face to face for Home Health. CM contacted and made aware of orders.

## 2016-07-11 DIAGNOSIS — J09X1 Influenza due to identified novel influenza A virus with pneumonia: Secondary | ICD-10-CM | POA: Diagnosis not present

## 2016-07-11 DIAGNOSIS — E669 Obesity, unspecified: Secondary | ICD-10-CM | POA: Diagnosis not present

## 2016-07-11 DIAGNOSIS — J129 Viral pneumonia, unspecified: Secondary | ICD-10-CM | POA: Diagnosis not present

## 2016-07-11 DIAGNOSIS — I10 Essential (primary) hypertension: Secondary | ICD-10-CM | POA: Diagnosis not present

## 2016-07-11 DIAGNOSIS — F330 Major depressive disorder, recurrent, mild: Secondary | ICD-10-CM | POA: Diagnosis not present

## 2016-07-11 DIAGNOSIS — Z9981 Dependence on supplemental oxygen: Secondary | ICD-10-CM | POA: Diagnosis not present

## 2016-07-11 DIAGNOSIS — Z9181 History of falling: Secondary | ICD-10-CM | POA: Diagnosis not present

## 2016-07-11 DIAGNOSIS — M1991 Primary osteoarthritis, unspecified site: Secondary | ICD-10-CM | POA: Diagnosis not present

## 2016-07-11 DIAGNOSIS — G4733 Obstructive sleep apnea (adult) (pediatric): Secondary | ICD-10-CM | POA: Diagnosis not present

## 2016-07-11 DIAGNOSIS — G629 Polyneuropathy, unspecified: Secondary | ICD-10-CM | POA: Diagnosis not present

## 2016-07-12 DIAGNOSIS — J129 Viral pneumonia, unspecified: Secondary | ICD-10-CM | POA: Diagnosis not present

## 2016-07-12 DIAGNOSIS — M1991 Primary osteoarthritis, unspecified site: Secondary | ICD-10-CM | POA: Diagnosis not present

## 2016-07-12 DIAGNOSIS — I10 Essential (primary) hypertension: Secondary | ICD-10-CM | POA: Diagnosis not present

## 2016-07-12 DIAGNOSIS — G629 Polyneuropathy, unspecified: Secondary | ICD-10-CM | POA: Diagnosis not present

## 2016-07-12 DIAGNOSIS — J09X1 Influenza due to identified novel influenza A virus with pneumonia: Secondary | ICD-10-CM | POA: Diagnosis not present

## 2016-07-12 DIAGNOSIS — E669 Obesity, unspecified: Secondary | ICD-10-CM | POA: Diagnosis not present

## 2016-07-16 DIAGNOSIS — G629 Polyneuropathy, unspecified: Secondary | ICD-10-CM | POA: Diagnosis not present

## 2016-07-16 DIAGNOSIS — I10 Essential (primary) hypertension: Secondary | ICD-10-CM | POA: Diagnosis not present

## 2016-07-16 DIAGNOSIS — M1991 Primary osteoarthritis, unspecified site: Secondary | ICD-10-CM | POA: Diagnosis not present

## 2016-07-16 DIAGNOSIS — J129 Viral pneumonia, unspecified: Secondary | ICD-10-CM | POA: Diagnosis not present

## 2016-07-16 DIAGNOSIS — J09X1 Influenza due to identified novel influenza A virus with pneumonia: Secondary | ICD-10-CM | POA: Diagnosis not present

## 2016-07-16 DIAGNOSIS — E669 Obesity, unspecified: Secondary | ICD-10-CM | POA: Diagnosis not present

## 2016-07-19 DIAGNOSIS — G629 Polyneuropathy, unspecified: Secondary | ICD-10-CM | POA: Diagnosis not present

## 2016-07-19 DIAGNOSIS — J09X1 Influenza due to identified novel influenza A virus with pneumonia: Secondary | ICD-10-CM | POA: Diagnosis not present

## 2016-07-19 DIAGNOSIS — I10 Essential (primary) hypertension: Secondary | ICD-10-CM | POA: Diagnosis not present

## 2016-07-19 DIAGNOSIS — M1991 Primary osteoarthritis, unspecified site: Secondary | ICD-10-CM | POA: Diagnosis not present

## 2016-07-19 DIAGNOSIS — J129 Viral pneumonia, unspecified: Secondary | ICD-10-CM | POA: Diagnosis not present

## 2016-07-19 DIAGNOSIS — E669 Obesity, unspecified: Secondary | ICD-10-CM | POA: Diagnosis not present

## 2016-07-20 DIAGNOSIS — E669 Obesity, unspecified: Secondary | ICD-10-CM | POA: Diagnosis not present

## 2016-07-20 DIAGNOSIS — G629 Polyneuropathy, unspecified: Secondary | ICD-10-CM | POA: Diagnosis not present

## 2016-07-20 DIAGNOSIS — J129 Viral pneumonia, unspecified: Secondary | ICD-10-CM | POA: Diagnosis not present

## 2016-07-20 DIAGNOSIS — M1991 Primary osteoarthritis, unspecified site: Secondary | ICD-10-CM | POA: Diagnosis not present

## 2016-07-20 DIAGNOSIS — J09X1 Influenza due to identified novel influenza A virus with pneumonia: Secondary | ICD-10-CM | POA: Diagnosis not present

## 2016-07-20 DIAGNOSIS — I10 Essential (primary) hypertension: Secondary | ICD-10-CM | POA: Diagnosis not present

## 2016-07-22 DIAGNOSIS — J129 Viral pneumonia, unspecified: Secondary | ICD-10-CM | POA: Diagnosis not present

## 2016-07-22 DIAGNOSIS — G629 Polyneuropathy, unspecified: Secondary | ICD-10-CM | POA: Diagnosis not present

## 2016-07-22 DIAGNOSIS — E669 Obesity, unspecified: Secondary | ICD-10-CM | POA: Diagnosis not present

## 2016-07-22 DIAGNOSIS — M1991 Primary osteoarthritis, unspecified site: Secondary | ICD-10-CM | POA: Diagnosis not present

## 2016-07-22 DIAGNOSIS — J09X1 Influenza due to identified novel influenza A virus with pneumonia: Secondary | ICD-10-CM | POA: Diagnosis not present

## 2016-07-22 DIAGNOSIS — I10 Essential (primary) hypertension: Secondary | ICD-10-CM | POA: Diagnosis not present

## 2016-07-26 ENCOUNTER — Ambulatory Visit (INDEPENDENT_AMBULATORY_CARE_PROVIDER_SITE_OTHER): Payer: Medicare Other | Admitting: Internal Medicine

## 2016-07-26 VITALS — BP 148/80 | HR 66 | Temp 98.0°F | Ht 60.0 in | Wt 245.4 lb

## 2016-07-26 DIAGNOSIS — Z8709 Personal history of other diseases of the respiratory system: Secondary | ICD-10-CM | POA: Diagnosis not present

## 2016-07-26 DIAGNOSIS — R5383 Other fatigue: Secondary | ICD-10-CM

## 2016-07-26 DIAGNOSIS — Z8701 Personal history of pneumonia (recurrent): Secondary | ICD-10-CM | POA: Diagnosis not present

## 2016-07-26 DIAGNOSIS — Z820 Family history of epilepsy and other diseases of the nervous system: Secondary | ICD-10-CM

## 2016-07-26 DIAGNOSIS — Z09 Encounter for follow-up examination after completed treatment for conditions other than malignant neoplasm: Secondary | ICD-10-CM

## 2016-07-26 DIAGNOSIS — Z823 Family history of stroke: Secondary | ICD-10-CM

## 2016-07-26 DIAGNOSIS — R5381 Other malaise: Secondary | ICD-10-CM | POA: Diagnosis not present

## 2016-07-26 DIAGNOSIS — Z87891 Personal history of nicotine dependence: Secondary | ICD-10-CM | POA: Diagnosis not present

## 2016-07-26 DIAGNOSIS — Z9981 Dependence on supplemental oxygen: Secondary | ICD-10-CM | POA: Diagnosis not present

## 2016-07-26 DIAGNOSIS — Z8249 Family history of ischemic heart disease and other diseases of the circulatory system: Secondary | ICD-10-CM | POA: Diagnosis not present

## 2016-07-26 DIAGNOSIS — J1100 Influenza due to unidentified influenza virus with unspecified type of pneumonia: Secondary | ICD-10-CM

## 2016-07-26 NOTE — Patient Instructions (Signed)
I'm sorry to hear that you are still not back to your baseline, I do not suspect any lingering infection and I am hopeful that with continued time your lungs will recover.  Please return to clinic in another 4 weeks for [place] to continue to evaluate your progress. In the meantime, continue to work with the Physical Therapist and use the incentive spirometer.

## 2016-07-26 NOTE — Progress Notes (Signed)
CC: HFU HPI: [name] is a 76 y.o. female with a h/o of OSA, HTN, HLD, Depression who presents for f/u after hospitalization for influenza and CAP.  Please see Problem-based charting for HPI and the status of patient's chronic medical conditions.  Past Medical History:  Diagnosis Date  . Arthritis    fingers  . Depression   . Dizziness    in AM, getting out of bed  . Dysrhythmia    "skips a beat" sometimes - followed by PCP  . Hypercholesteremia   . Hypertension   . Neuropathy ([facility])    bilateral feet  . Shortness of breath dyspnea   . Sleep apnea    has CPAP, doesn't use  . Umbilical hernia    Social History  Substance Use Topics  . Smoking status: Former Research scientist (life sciences)  . Smokeless tobacco: Never Used     Comment: quit 40+ yrs ago, 1 PPD for a few years  . Alcohol use No   Family History  Problem Relation Age of Onset  . Congestive Heart Failure Mother   . Stroke Father   . Parkinson's disease Father    Review of Systems: ROS in HPI. Otherwise: Review of Systems  Constitutional: Negative for chills, fever and weight loss.  Respiratory: Positive for shortness of breath. Negative for cough, sputum production and wheezing.   Cardiovascular: Negative for chest pain and leg swelling.  Gastrointestinal: Negative for abdominal pain, constipation, diarrhea, nausea and vomiting.  Genitourinary: Negative for dysuria, frequency and urgency.  Neurological: Positive for weakness. Negative for dizziness.   Physical Exam: Vitals:   07/26/16 1334  BP: (!) 148/80  Pulse: 66  Temp: 98 F (36.7 C)  TempSrc: Oral  SpO2: 94%  Weight: 245 lb 6.4 oz (111.3 kg)  Height: 5' (1.524 m)   Physical Exam  Constitutional: She is oriented to person, place, and time. She appears well-developed. She is cooperative. No distress.  Cardiovascular: Normal rate, regular rhythm, normal heart sounds and normal pulses.  Exam reveals no gallop.   No murmur heard. Pulmonary/Chest: Effort normal  and breath sounds normal. No respiratory distress. She has no wheezes. She has no rhonchi. She has no rales. Breasts are symmetrical.  Abdominal: Soft. Bowel sounds are normal. There is no tenderness.  Musculoskeletal: She exhibits no edema.  Neurological: She is alert and oriented to person, place, and time.    Assessment & Plan:  See encounters tab for problem based medical decision making. Patient discussed with Dr. Lynnae January  Influenza with pneumonia Patient presents for hospital follow-up after influenza, acute bacteremia acquired pneumonia. She was discharged on 2 L oxygen with O2 saturations in the mid 90s on this therapy. She has had home PT with good success but has been unable to wean herself off of the oxygen. She reports that with home health nursing checks her oxygen levels have been in the high 80s on room air. In the clinic today she is requiring 2 L of oxygen for oxygen saturation of 94%. Patient is still feeling weak but denies any other lingering symptoms of her recent pneumonia. She has no cough, no upper airway congestion, no fevers or chills, no chest pain, no myalgias. She has lingering malaise and fatigue and has been using her incentive spirometer daily. Patient does have a remote history of tobacco use roughly 5-pack-years 25 years ago. No other pulmonary history.  Given the patient's age 65 years as well as severity of her recent pneumonia she may have a  prolonged period of convalescence. I still optimistic that she will be able to rehabilitate her pulmonary function and will not require oxygen long-term but at this point it is still necessary.  Plan: Continue home O2 therapy 2 L nasal cannula as needed. Continue incentive spirometry and home PT for improving endurance and pulmonary function. Follow-up in 4 weeks for reassessment.   Signed: Holley Raring, MD 07/26/2016, 5:05 PM  Pager: [phone]

## 2016-07-26 NOTE — Progress Notes (Deleted)
   CC: *** HPI: [name] is a 76 y.o. female with a h/o of *** who presents ***  Please see Problem-based charting for HPI and the status of patient's chronic medical conditions.  Past Medical History:  Diagnosis Date  . Arthritis    fingers  . Depression   . Dizziness    in AM, getting out of bed  . Dysrhythmia    "skips a beat" sometimes - followed by PCP  . Hypercholesteremia   . Hypertension   . Neuropathy ([place])    bilateral feet  . Shortness of breath dyspnea   . Sleep apnea    has CPAP, doesn't use  . Umbilical hernia    Social History  Substance Use Topics  . Smoking status: Former Research scientist (life sciences)  . Smokeless tobacco: Never Used     Comment: quit 40+ yrs ago, 1 PPD for a few years  . Alcohol use No   Family History  Problem Relation Age of Onset  . Congestive Heart Failure Mother   . Stroke Father   . Parkinson's disease Father    Review of Systems: ROS in HPI. Otherwise: ROS Physical Exam: Vitals:   07/26/16 1334  BP: (!) 148/80  Pulse: 66  Temp: 98 F (36.7 C)  TempSrc: Oral  SpO2: 94%  Weight: 245 lb 6.4 oz (111.3 kg)  Height: 5' (1.524 m)   Physical Exam  Assessment & Plan:  See encounters tab for problem based medical decision making. Patient {GC/GE:3044014::"discussed with","seen with"} Dr. {NAMES:3044014::"Butcher","Granfortuna","E. Hoffman","Klima","Mullen","Narendra","Vincent"}  No problem-specific Assessment & Plan notes found for this encounter.   Signed: Holley Raring, MD 07/26/2016, 2:07 PM  Pager: [phone]

## 2016-07-26 NOTE — Assessment & Plan Note (Signed)
Patient presents for hospital follow-up after influenza, acute bacteremia acquired pneumonia. She was discharged on 2 L oxygen with O2 saturations in the mid 90s on this therapy. She has had home PT with good success but has been unable to wean herself off of the oxygen. She reports that with home health nursing checks her oxygen levels have been in the high 80s on room air. In the clinic today she is requiring 2 L of oxygen for oxygen saturation of 94%. Patient is still feeling weak but denies any other lingering symptoms of her recent pneumonia. She has no cough, no upper airway congestion, no fevers or chills, no chest pain, no myalgias. She has lingering malaise and fatigue and has been using her incentive spirometer daily. Patient does have a remote history of tobacco use roughly 5-pack-years 25 years ago. No other pulmonary history.  Given the patient's age 76 years as well as severity of her recent pneumonia she may have a prolonged period of convalescence. I still optimistic that she will be able to rehabilitate her pulmonary function and will not require oxygen long-term but at this point it is still necessary.  Plan: Continue home O2 therapy 2 L nasal cannula as needed. Continue incentive spirometry and home PT for improving endurance and pulmonary function. Follow-up in 4 weeks for reassessment.

## 2016-07-27 DIAGNOSIS — J09X1 Influenza due to identified novel influenza A virus with pneumonia: Secondary | ICD-10-CM | POA: Diagnosis not present

## 2016-07-27 DIAGNOSIS — M1991 Primary osteoarthritis, unspecified site: Secondary | ICD-10-CM | POA: Diagnosis not present

## 2016-07-27 DIAGNOSIS — E669 Obesity, unspecified: Secondary | ICD-10-CM | POA: Diagnosis not present

## 2016-07-27 DIAGNOSIS — I10 Essential (primary) hypertension: Secondary | ICD-10-CM | POA: Diagnosis not present

## 2016-07-27 DIAGNOSIS — J129 Viral pneumonia, unspecified: Secondary | ICD-10-CM | POA: Diagnosis not present

## 2016-07-27 DIAGNOSIS — G629 Polyneuropathy, unspecified: Secondary | ICD-10-CM | POA: Diagnosis not present

## 2016-07-27 NOTE — Progress Notes (Signed)
Internal Medicine Clinic Attending  Case discussed with Dr. Strelow at the time of the visit.  We reviewed the resident's history and exam and pertinent patient test results.  I agree with the assessment, diagnosis, and plan of care documented in the resident's note.  

## 2016-07-29 DIAGNOSIS — J09X1 Influenza due to identified novel influenza A virus with pneumonia: Secondary | ICD-10-CM | POA: Diagnosis not present

## 2016-07-29 DIAGNOSIS — J129 Viral pneumonia, unspecified: Secondary | ICD-10-CM | POA: Diagnosis not present

## 2016-07-29 DIAGNOSIS — G629 Polyneuropathy, unspecified: Secondary | ICD-10-CM | POA: Diagnosis not present

## 2016-07-29 DIAGNOSIS — I10 Essential (primary) hypertension: Secondary | ICD-10-CM | POA: Diagnosis not present

## 2016-07-29 DIAGNOSIS — M1991 Primary osteoarthritis, unspecified site: Secondary | ICD-10-CM | POA: Diagnosis not present

## 2016-07-29 DIAGNOSIS — E669 Obesity, unspecified: Secondary | ICD-10-CM | POA: Diagnosis not present

## 2016-07-30 DIAGNOSIS — J09X1 Influenza due to identified novel influenza A virus with pneumonia: Secondary | ICD-10-CM | POA: Diagnosis not present

## 2016-07-30 DIAGNOSIS — I10 Essential (primary) hypertension: Secondary | ICD-10-CM | POA: Diagnosis not present

## 2016-07-30 DIAGNOSIS — E669 Obesity, unspecified: Secondary | ICD-10-CM | POA: Diagnosis not present

## 2016-07-30 DIAGNOSIS — M1991 Primary osteoarthritis, unspecified site: Secondary | ICD-10-CM | POA: Diagnosis not present

## 2016-07-30 DIAGNOSIS — J129 Viral pneumonia, unspecified: Secondary | ICD-10-CM | POA: Diagnosis not present

## 2016-07-30 DIAGNOSIS — G629 Polyneuropathy, unspecified: Secondary | ICD-10-CM | POA: Diagnosis not present

## 2016-08-03 DIAGNOSIS — I10 Essential (primary) hypertension: Secondary | ICD-10-CM | POA: Diagnosis not present

## 2016-08-03 DIAGNOSIS — J09X1 Influenza due to identified novel influenza A virus with pneumonia: Secondary | ICD-10-CM | POA: Diagnosis not present

## 2016-08-03 DIAGNOSIS — M1991 Primary osteoarthritis, unspecified site: Secondary | ICD-10-CM | POA: Diagnosis not present

## 2016-08-03 DIAGNOSIS — G629 Polyneuropathy, unspecified: Secondary | ICD-10-CM | POA: Diagnosis not present

## 2016-08-03 DIAGNOSIS — J129 Viral pneumonia, unspecified: Secondary | ICD-10-CM | POA: Diagnosis not present

## 2016-08-03 DIAGNOSIS — E669 Obesity, unspecified: Secondary | ICD-10-CM | POA: Diagnosis not present

## 2016-08-06 DIAGNOSIS — J129 Viral pneumonia, unspecified: Secondary | ICD-10-CM | POA: Diagnosis not present

## 2016-08-06 DIAGNOSIS — E669 Obesity, unspecified: Secondary | ICD-10-CM | POA: Diagnosis not present

## 2016-08-06 DIAGNOSIS — M1991 Primary osteoarthritis, unspecified site: Secondary | ICD-10-CM | POA: Diagnosis not present

## 2016-08-06 DIAGNOSIS — I10 Essential (primary) hypertension: Secondary | ICD-10-CM | POA: Diagnosis not present

## 2016-08-06 DIAGNOSIS — J09X1 Influenza due to identified novel influenza A virus with pneumonia: Secondary | ICD-10-CM | POA: Diagnosis not present

## 2016-08-06 DIAGNOSIS — G629 Polyneuropathy, unspecified: Secondary | ICD-10-CM | POA: Diagnosis not present

## 2016-08-11 DIAGNOSIS — J09X1 Influenza due to identified novel influenza A virus with pneumonia: Secondary | ICD-10-CM | POA: Diagnosis not present

## 2016-08-11 DIAGNOSIS — J129 Viral pneumonia, unspecified: Secondary | ICD-10-CM | POA: Diagnosis not present

## 2016-08-11 DIAGNOSIS — E669 Obesity, unspecified: Secondary | ICD-10-CM | POA: Diagnosis not present

## 2016-08-11 DIAGNOSIS — M1991 Primary osteoarthritis, unspecified site: Secondary | ICD-10-CM | POA: Diagnosis not present

## 2016-08-11 DIAGNOSIS — I10 Essential (primary) hypertension: Secondary | ICD-10-CM | POA: Diagnosis not present

## 2016-08-11 DIAGNOSIS — G629 Polyneuropathy, unspecified: Secondary | ICD-10-CM | POA: Diagnosis not present

## 2016-08-23 ENCOUNTER — Ambulatory Visit: Payer: Medicare Other

## 2016-08-26 DIAGNOSIS — J129 Viral pneumonia, unspecified: Secondary | ICD-10-CM | POA: Diagnosis not present

## 2016-08-26 DIAGNOSIS — G629 Polyneuropathy, unspecified: Secondary | ICD-10-CM | POA: Diagnosis not present

## 2016-08-26 DIAGNOSIS — E669 Obesity, unspecified: Secondary | ICD-10-CM | POA: Diagnosis not present

## 2016-08-26 DIAGNOSIS — J09X1 Influenza due to identified novel influenza A virus with pneumonia: Secondary | ICD-10-CM | POA: Diagnosis not present

## 2016-08-26 DIAGNOSIS — I10 Essential (primary) hypertension: Secondary | ICD-10-CM | POA: Diagnosis not present

## 2016-08-26 DIAGNOSIS — M1991 Primary osteoarthritis, unspecified site: Secondary | ICD-10-CM | POA: Diagnosis not present

## 2016-09-07 DIAGNOSIS — J129 Viral pneumonia, unspecified: Secondary | ICD-10-CM | POA: Diagnosis not present

## 2016-09-07 DIAGNOSIS — I10 Essential (primary) hypertension: Secondary | ICD-10-CM | POA: Diagnosis not present

## 2016-09-07 DIAGNOSIS — M1991 Primary osteoarthritis, unspecified site: Secondary | ICD-10-CM | POA: Diagnosis not present

## 2016-09-07 DIAGNOSIS — J09X1 Influenza due to identified novel influenza A virus with pneumonia: Secondary | ICD-10-CM | POA: Diagnosis not present

## 2016-09-07 DIAGNOSIS — E669 Obesity, unspecified: Secondary | ICD-10-CM | POA: Diagnosis not present

## 2016-09-07 DIAGNOSIS — G629 Polyneuropathy, unspecified: Secondary | ICD-10-CM | POA: Diagnosis not present

## 2016-12-08 DIAGNOSIS — Z136 Encounter for screening for cardiovascular disorders: Secondary | ICD-10-CM | POA: Diagnosis not present

## 2016-12-08 DIAGNOSIS — Z6841 Body Mass Index (BMI) 40.0 and over, adult: Secondary | ICD-10-CM | POA: Diagnosis not present

## 2016-12-08 DIAGNOSIS — Z0000 Encounter for general adult medical examination without abnormal findings: Secondary | ICD-10-CM | POA: Diagnosis not present

## 2016-12-08 DIAGNOSIS — Z1211 Encounter for screening for malignant neoplasm of colon: Secondary | ICD-10-CM | POA: Diagnosis not present

## 2016-12-08 DIAGNOSIS — E785 Hyperlipidemia, unspecified: Secondary | ICD-10-CM | POA: Diagnosis not present

## 2016-12-08 DIAGNOSIS — Z23 Encounter for immunization: Secondary | ICD-10-CM | POA: Diagnosis not present

## 2017-02-13 ENCOUNTER — Emergency Department (HOSPITAL_COMMUNITY): Payer: Medicare Other

## 2017-02-13 ENCOUNTER — Emergency Department (HOSPITAL_COMMUNITY)
Admission: EM | Admit: 2017-02-13 | Discharge: 2017-02-13 | Disposition: A | Discharge status: Home / Self Care | Payer: Medicare Other | Attending: Emergency Medicine | Admitting: Emergency Medicine

## 2017-02-13 ENCOUNTER — Encounter (HOSPITAL_COMMUNITY): Payer: Self-pay | Admitting: *Deleted

## 2017-02-13 DIAGNOSIS — I10 Essential (primary) hypertension: Secondary | ICD-10-CM | POA: Insufficient documentation

## 2017-02-13 DIAGNOSIS — Z87891 Personal history of nicotine dependence: Secondary | ICD-10-CM | POA: Diagnosis not present

## 2017-02-13 DIAGNOSIS — Z79899 Other long term (current) drug therapy: Secondary | ICD-10-CM | POA: Diagnosis not present

## 2017-02-13 DIAGNOSIS — R42 Dizziness and giddiness: Secondary | ICD-10-CM | POA: Insufficient documentation

## 2017-02-13 DIAGNOSIS — R0602 Shortness of breath: Secondary | ICD-10-CM | POA: Insufficient documentation

## 2017-02-13 LAB — BASIC METABOLIC PANEL
Anion gap: 11 (ref 5–15)
BUN: 15 mg/dL (ref 6–20)
CALCIUM: 8.8 mg/dL — AB (ref 8.9–10.3)
CO2: 25 mmol/L (ref 22–32)
CREATININE: 1.05 mg/dL — AB (ref 0.44–1.00)
Chloride: 102 mmol/L (ref 101–111)
GFR calc Af Amer: 58 mL/min — ABNORMAL LOW (ref >60)
GFR calc non Af Amer: 50 mL/min — ABNORMAL LOW (ref >60)
GLUCOSE: 126 mg/dL — AB (ref 65–99)
Potassium: 3.3 mmol/L — ABNORMAL LOW (ref 3.5–5.1)
Sodium: 138 mmol/L (ref 135–145)

## 2017-02-13 LAB — CBC
HCT: 35.2 % — ABNORMAL LOW (ref 36.0–46.0)
Hemoglobin: 11.6 g/dL — ABNORMAL LOW (ref 12.0–15.0)
MCH: 30.8 pg (ref 26.0–34.0)
MCHC: 33 g/dL (ref 30.0–36.0)
MCV: 93.4 fL (ref 78.0–100.0)
PLATELETS: 214 10*3/uL (ref 150–400)
RBC: 3.77 MIL/uL — ABNORMAL LOW (ref 3.87–5.11)
RDW: 13.3 % (ref 11.5–15.5)
WBC: 8.4 10*3/uL (ref 4.0–10.5)

## 2017-02-13 LAB — I-STAT TROPONIN, ED: TROPONIN I, POC: 0 ng/mL (ref 0.00–0.08)

## 2017-02-13 LAB — BRAIN NATRIURETIC PEPTIDE: B Natriuretic Peptide: 115.1 pg/mL — ABNORMAL HIGH (ref 0.0–100.0)

## 2017-02-13 MED ORDER — ALBUTEROL SULFATE (2.5 MG/3ML) 0.083% IN NEBU
5.0000 mg | INHALATION_SOLUTION | Freq: Once | RESPIRATORY_TRACT | Status: AC
  Administered 2017-02-13: 5 mg via RESPIRATORY_TRACT

## 2017-02-13 MED ORDER — ALBUTEROL SULFATE (2.5 MG/3ML) 0.083% IN NEBU
INHALATION_SOLUTION | RESPIRATORY_TRACT | Status: DC
Start: 2017-02-13 — End: 2017-02-13
  Filled 2017-02-13: qty 3

## 2017-02-13 NOTE — ED Notes (Signed)
ED Provider at bedside. 

## 2017-02-13 NOTE — Discharge Instructions (Signed)
You will need more work-up with a lung doctor to see why you have not been able to come off of your oxygen since your diagnosis of pneumonia/influenza in January. You are given the phone number, please call on Monday for an appointment.  Please also call your PCP on Monday to set up close follow-up.   In the interim, I recommend that you continue to wear your oxygen.   Please return for worsening symptoms, including worsening shortness of breath, chest pain, passing out, confusion or any other symptoms concerning to you.

## 2017-02-13 NOTE — ED Triage Notes (Signed)
Pt was looking pale today and friend checked spo2, pt was 84% on RA.  Pt was placed on O2 at home which she still had at home due to recent pneumonia and flu after which she was discharged with home O2.  Pt is 95% on 2L [place] in triage.  Pt denies any CP at this time, she states that she had some CP yesterday.  Pt states that she has chronic SOB, pt states that the sob is not more than usual.  Pt states that she has not used O2 at home for a month.

## 2017-02-13 NOTE — ED Notes (Addendum)
Pt family reports increased SOB and decreasing O2 saturation. Per pt family, pt was dx with pneumonia and flu in Jan. Pt on RA typically at home however pt family reports pt O2 decreased to approx 85% on RA. Pt placed on 2L of home O2. Pt with obvious dyspnea on exertion, tachypneic. O2 saturation 97% on 2L [place] at this time. A&Ox4.

## 2017-02-13 NOTE — ED Notes (Signed)
Removed spo2 [place], pt remained in WC on RA.  Pt spo2 dipped to 91% on RA.  Breathing treatment at this time (albuterol on medical air, pt spo2 94% on RA at this time)

## 2017-02-13 NOTE — ED Provider Notes (Signed)
[facility] DEPT Provider Note   CSN: [id number] Arrival date & time: 02/13/17  1551     History   Chief Complaint Chief Complaint  Patient presents with  . Shortness of Breath    HPI Kathy [name] is a 76 y.o. female.  HPI 76 year old female who presents with dyspnea. She has a history of OSA, chronic shortness of breath, and hypertension. The patient was discharged with home oxygen after hospitalization for pneumonia and influenza in January 2018. States that even before then was having chronic shortness of breath with activity. States that she has slowly been weaning herself off of oxygen, and about 6-7 weeks ago stopped wearing it continuously. However states that with activity she still felt short of breath and required wearing oxygen as needed. Today, states that she drove across the street to visit her neighbor, and walking into her home and through her house felt very lightheaded and dizzy. She appeared pale, and winds should her pulse ox was checked by family her O2 saturations were 85%. She was placed on her home 2 L of oxygen, and states that her symptoms had improved. She denies any chest pain. Occasionally has lower extremity edema. Denies any fevers, cough, recent immobilization, nausea, or vomiting.   Past Medical History:  Diagnosis Date  . Arthritis    fingers  . Depression   . Dizziness    in AM, getting out of bed  . Dysrhythmia    "skips a beat" sometimes - followed by PCP  . Hypercholesteremia   . Hypertension   . Neuropathy    bilateral feet  . Shortness of breath dyspnea   . Sleep apnea    has CPAP, doesn't use  . Umbilical hernia     Patient Active Problem List   Diagnosis Date Noted  . Oral herpes simplex infection   . Influenza with pneumonia 06/28/2016  . Community acquired pneumonia 06/27/2016  . Hypertension 06/27/2016  . Depression 06/27/2016  . Sleep apnea 06/27/2016    Past Surgical History:  Procedure Laterality Date  . ABDOMINAL  HYSTERECTOMY    . BROW LIFT Bilateral 07/15/2015   Procedure: BLEPHAROPLASTY;  Surgeon: Karle Starch, MD;  Location: [facility];  Service: Ophthalmology;  Laterality: Bilateral;  . CHOLECYSTECTOMY    . HAMMER TOE SURGERY    . HERNIA REPAIR    . KNEE ARTHROSCOPY Bilateral   . PTOSIS REPAIR Bilateral 07/15/2015   Procedure: PTOSIS REPAIR;  Surgeon: Karle Starch, MD;  Location: [facility];  Service: Ophthalmology;  Laterality: Bilateral;  CPAP  . TONSILLECTOMY      OB History    No data available       Home Medications    Prior to Admission medications   Medication Sig Start Date End Date Taking? Authorizing Provider  albuterol (PROVENTIL HFA;VENTOLIN HFA) 108 (90 Base) MCG/ACT inhaler Inhale 1-2 puffs into the lungs every 6 (six) hours as needed for wheezing or shortness of breath. 06/22/16  Yes Barnet Glasgow, NP  fenofibrate micronized (LOFIBRA) 134 MG capsule Take 134 mg by mouth daily before breakfast.   Yes [provider]  furosemide (LASIX) 80 MG tablet Take 80 mg by mouth every morning. 07/22/14  Yes [provider]  losartan (COZAAR) 100 MG tablet Take 100 mg by mouth daily.   Yes [provider]  meloxicam (MOBIC) 15 MG tablet Take 15 mg by mouth daily.  07/26/14  Yes [provider]  metoprolol succinate (TOPROL-XL) 50 MG 24  hr tablet Take 50 mg by mouth daily. 06/25/14  Yes [provider]  venlafaxine XR (EFFEXOR-XR) 150 MG 24 hr capsule Take 150 mg by mouth daily. 05/17/14  Yes [provider]  benzonatate (TESSALON) 100 MG capsule Take 1 capsule (100 mg total) by mouth every 8 (eight) hours. Patient not taking: Reported on 02/13/2017 06/22/16   Barnet Glasgow, NP    Family History Family History  Problem Relation Age of Onset  . Congestive Heart Failure Mother   . Stroke Father   . Parkinson's disease Father     Social History Social History  Substance Use Topics  . Smoking status: Former  Research scientist (life sciences)  . Smokeless tobacco: Never Used     Comment: quit 40+ yrs ago, 1 PPD for a few years  . Alcohol use No     Allergies   Lipitor [atorvastatin] and Requip [ropinirole hcl]   Review of Systems Review of Systems  Constitutional: Negative for fever.  Respiratory: Positive for shortness of breath.   Cardiovascular: Negative for chest pain.  Gastrointestinal: Negative for abdominal pain and vomiting.  All other systems reviewed and are negative.    Physical Exam Updated Vital Signs BP (!) 131/46   Pulse 81   Temp 98.1 F (36.7 C) (Oral)   Resp 18   Wt 113.4 kg (250 lb)   SpO2 97%   BMI 48.82 kg/m   Physical Exam Physical Exam  Nursing note and vitals reviewed. Constitutional: non-toxic, and in no acute distress Head: Normocephalic and atraumatic.  Mouth/Throat: Oropharynx is clear and moist.  Neck: Normal range of motion. Neck supple.  Cardiovascular: Normal rate and regular rhythm.  No significant edema. Pulmonary/Chest: Effort normal and breath sounds normal.  Abdominal: Soft. There is no tenderness. There is no rebound and no guarding.  Musculoskeletal: Normal range of motion.  Neurological: Alert, no facial droop, fluent speech, moves all extremities symmetrically Skin: Skin is warm and dry.  Psychiatric: Cooperative   ED Treatments / Results  Labs (all labs ordered are listed, but only abnormal results are displayed) Labs Reviewed  BASIC METABOLIC PANEL - Abnormal; Notable for the following:       Result Value   Potassium 3.3 (*)    Glucose, Bld 126 (*)    Creatinine, Ser 1.05 (*)    Calcium 8.8 (*)    GFR calc non Af Amer 50 (*)    GFR calc Af Amer 58 (*)    All other components within normal limits  CBC - Abnormal; Notable for the following:    RBC 3.77 (*)    Hemoglobin 11.6 (*)    HCT 35.2 (*)    All other components within normal limits  BRAIN NATRIURETIC PEPTIDE - Abnormal; Notable for the following:    B Natriuretic Peptide 115.1 (*)      All other components within normal limits  I-STAT TROPONIN, ED    EKG  EKG Interpretation  Date/Time:  Sunday February 13 2017 16:06:05 EDT Ventricular Rate:  77 PR Interval:  160 QRS Duration: 74 QT Interval:  392 QTC Calculation: 443 R Axis:   -13 Text Interpretation:  Normal sinus rhythm Cannot rule out Anterior infarct , age undetermined Abnormal ECG no acute changes  Confirmed by Brantley Stage [phone]) on 02/13/2017 7:05:12 PM       Radiology Dg Chest 2 View  Result Date: 02/13/2017 CLINICAL DATA:  Shortness of breath and hypoxia EXAM: CHEST  2 VIEW COMPARISON:  06/30/2016 FINDINGS: Cardiac shadow is stable.  Aortic calcifications are again seen. Mild interstitial changes are noted but improved when compare with the prior exam. No sizable effusion is noted. No focal infiltrate is seen. No bony abnormality is noted. IMPRESSION: Improved aeration bilaterally with some persistent interstitial changes likely of a chronic nature. Electronically Signed   By: Inez Catalina M.D.   On: 02/13/2017 17:02    Procedures Procedures (including critical care time)  Medications Ordered in ED Medications  albuterol (PROVENTIL) (2.5 MG/3ML) 0.083% nebulizer solution 5 mg (5 mg Nebulization Given 02/13/17 1617)     Initial Impression / Assessment and Plan / ED Course  I have reviewed the triage vital signs and the nursing notes.  Pertinent labs & imaging results that were available during my care of the patient were reviewed by me and considered in my medical decision making (see chart for details).     76 year old female who presents with episode of hypoxia. This appears to likely have been an ongoing issue for her since she has taken herself off of her oxygen. States that over the past 6-7 weeks since taking herself off of her oxygen she with minimal activity has shortness of breath and feels winded with activity. Often gives herself when necessary oxygen, but often waits until it resolves.  On  2 L oxygen here, she feels comfortable without any dyspnea. She ambulates with oxygen and does not have not have any shortness of breath or fatigue. Suspect that she has needed oxygen since discharge from hospital in 06/2016. However, her lung function has not recovered since her diagnosis of Pneumonia. Her CXR today shows no pneumonia, infiltrate, edema or other acute cardiopulmonary processes. Blood work overall reassuring with mild acute kidney injury. Troponin is negative, BNP is low, and she does not have significant CHF symptoms. I also do not think this is consistent with ACS. This is been ongoing since January, and I feel less likely due to PE.  I do think she requires pulmonary evaluation to evaluate her for inability to wean off of her oxygen. She continues to have 2 L oxygen at home which I encouraged her to use more continuously. I do feel that she is appropriate for outpatient workup with pulmonology and given referral. She will also call her PCP for close follow-up. Strict return and follow-up instructions reviewed. She expressed understanding of all discharge instructions and felt comfortable with the plan of care.     Final Clinical Impressions(s) / ED Diagnoses   Final diagnoses:  Shortness of breath    New Prescriptions Discharge Medication List as of 02/13/2017  8:32 PM       Forde Dandy, MD 02/14/17 0120

## 2017-02-13 NOTE — ED Notes (Signed)
Ambulated patient in the hallway with no complaints of dizziness. Patients' 02 saturation remained above 95 while ambulating.

## 2017-02-13 NOTE — ED Notes (Addendum)
Unsuccessful attempt to draw blood. Pt vein bleeding to slow, high risk of blood hemolyzing. Mendel Ryder, phlebotomist called. Will be here shortly to stick pt.

## 2017-02-13 NOTE — ED Notes (Addendum)
Pt requesting to not have line placed, will ask Dr. Oleta Mouse if she feels IV is needed. Pt does not want blood work drawn if IV will be placed to "avoid being stuck" too many times.

## 2017-02-14 ENCOUNTER — Telehealth: Payer: Self-pay

## 2017-02-14 NOTE — Telephone Encounter (Signed)
Okay to try to get scheduled with me sooner.  Okay to double book visit.

## 2017-02-14 NOTE — Telephone Encounter (Signed)
Spoke with pt. She has been scheduled with VS on 02/15/17 at 3pm. Nothing further was needed.

## 2017-02-14 NOTE — Telephone Encounter (Signed)
Called and spoke with pt. Pt states she had recent ED visit for sob and low O2 levels. Pt has been scheduled for consult with VS on 03/22/17, as this was first available. Pt is requesting sooner appointment.  VS please advise if pt can be fit in sooner. Thanks.

## 2017-02-15 ENCOUNTER — Ambulatory Visit (INDEPENDENT_AMBULATORY_CARE_PROVIDER_SITE_OTHER): Payer: Medicare Other | Admitting: Pulmonary Disease

## 2017-02-15 ENCOUNTER — Encounter: Payer: Self-pay | Admitting: Pulmonary Disease

## 2017-02-15 VITALS — BP 109/54 | HR 68 | Ht 60.0 in | Wt 258.0 lb

## 2017-02-15 DIAGNOSIS — J849 Interstitial pulmonary disease, unspecified: Secondary | ICD-10-CM

## 2017-02-15 DIAGNOSIS — R0609 Other forms of dyspnea: Secondary | ICD-10-CM

## 2017-02-15 NOTE — Progress Notes (Signed)
   Subjective:    Patient ID: Kathy [name], female    DOB: 03-16-1941, 76 y.o.   MRN: [id number]  HPI    Review of Systems  Constitutional: Negative for fever and unexpected weight change.  HENT: Positive for congestion. Negative for dental problem, ear pain, nosebleeds, postnasal drip, rhinorrhea, sinus pressure, sneezing, sore throat and trouble swallowing.   Eyes: Negative for redness and itching.  Respiratory: Positive for cough, shortness of breath and wheezing. Negative for chest tightness.        Mucus production - gray/white  Cardiovascular: Positive for leg swelling. Negative for palpitations.  Gastrointestinal: Negative for nausea and vomiting.  Genitourinary: Negative for dysuria.  Musculoskeletal: Negative for joint swelling.  Skin: Negative for rash.  Neurological: Negative for headaches.  Hematological: Does not bruise/bleed easily.  Psychiatric/Behavioral: Negative for dysphoric mood. The patient is not nervous/anxious.        Objective:   Physical Exam        Assessment & Plan:

## 2017-02-15 NOTE — Patient Instructions (Signed)
Lab tests today  Will schedule CT chest and pulmonary function test  Follow up in 4 weeks with Dr. Halford Chessman or Nurse Practitioner

## 2017-02-15 NOTE — Progress Notes (Signed)
Past surgical history She  has a past surgical history that includes Tonsillectomy; Cholecystectomy; Abdominal hysterectomy; Hernia repair; Knee arthroscopy (Bilateral); Hammer toe surgery; Brow lift (Bilateral, 07/15/2015); and Ptosis repair (Bilateral, 07/15/2015).  Family history Her family history includes Congestive Heart Failure in her mother; Parkinson's disease in her father; Stroke in her father.  Social history She  reports that she quit smoking about 25 years ago. She has a 29.00 pack-year smoking history. She has never used smokeless tobacco. She reports that she does not drink alcohol or use drugs.  Review of Systems  Constitutional: Negative for fever and unexpected weight change.  HENT: Positive for congestion. Negative for dental problem, ear pain, nosebleeds, postnasal drip, rhinorrhea, sinus pressure, sneezing, sore throat and trouble swallowing.   Eyes: Negative for redness and itching.  Respiratory: Positive for cough, shortness of breath and wheezing. Negative for chest tightness.        Mucus production - gray/white  Cardiovascular: Positive for leg swelling. Negative for palpitations.  Gastrointestinal: Negative for nausea and vomiting.  Genitourinary: Negative for dysuria.  Musculoskeletal: Negative for joint swelling.  Skin: Negative for rash.  Neurological: Negative for headaches.  Hematological: Does not bruise/bleed easily.  Psychiatric/Behavioral: Negative for dysphoric mood. The patient is not nervous/anxious.    Allergies  Allergen Reactions  . Lipitor [Atorvastatin] Other (See Comments)    Memory issues  . Requip [Ropinirole Hcl] Other (See Comments)    Pt reports feeling generally unwell on this medication    Current Outpatient Prescriptions on File Prior to Visit  Medication Sig  . albuterol (PROVENTIL HFA;VENTOLIN HFA) 108 (90 Base) MCG/ACT inhaler Inhale 1-2 puffs into the lungs every 6 (six) hours as needed for wheezing or shortness of breath.  .  fenofibrate micronized (LOFIBRA) 134 MG capsule Take 134 mg by mouth daily before breakfast.  . furosemide (LASIX) 80 MG tablet Take 80 mg by mouth every morning.  Marland Kitchen losartan (COZAAR) 100 MG tablet Take 100 mg by mouth daily.  . meloxicam (MOBIC) 15 MG tablet Take 15 mg by mouth daily.   . metoprolol succinate (TOPROL-XL) 50 MG 24 hr tablet Take 50 mg by mouth daily.  Marland Kitchen venlafaxine XR (EFFEXOR-XR) 150 MG 24 hr capsule Take 150 mg by mouth daily.  . benzonatate (TESSALON) 100 MG capsule Take 1 capsule (100 mg total) by mouth every 8 (eight) hours. (Patient not taking: Reported on 02/13/2017)   No current facility-administered medications on file prior to visit.     Chief Complaint  Patient presents with  . PULMONARY CONSULT    Referred by Dr Verta Ellen for SOB. O2 levels have been dropping 88-89%. Has O2 at home but has not been using consistently. Pt has OSA, does not use CPAP, but does wear O2 at 2 liters at night. DME: [facility]. Needs new DME for CPAP if going to stay on this.     Past medical history She  has a past medical history of Arthritis; Depression; Dizziness; Dysrhythmia; Hypercholesteremia; Hypertension; Neuropathy; Shortness of breath dyspnea; Sleep apnea; and Umbilical hernia.  Vital signs BP (!) 109/54 (BP Location: Right Wrist, Cuff Size: Normal)   Pulse 68   Ht 5' (1.524 m)   Wt 258 lb (117 kg)   SpO2 91%   BMI 50.39 kg/m   History of present illness Kathy [name] is a 76 y.o. female former smoker with dyspnea and hypoxia.  She has noticed trouble with her breathing for years.  This has gotten progressively worse over the past  few months since she had pneumonia (Influenza A January 2018).  She gets cough and chest congestion.  She can hardly do activities.  She was noted to have low oxygen levels.  She was set up for home oxygen.  She gets pain in her fingers from over activity, but no other joint symptoms.  She denies skin rash.  No hx of CTD.  She has a International aid/development worker.  No  occupational exposures.  She quit smoking years ago.  She had a chest xray from 02/13/17 which showed chronic interstitial markings.  Physical exam  General - No distress ENT - No sinus tenderness, no oral exudate, no LAN, no thyromegaly, TM clear, pupils equal/reactive Cardiac - s1s2 regular, no murmur, pulses symmetric Chest - decreased breath sounds, scattered rhonchi Back - No focal tenderness Abd - Soft, non-tender, no organomegaly, + bowel sounds Ext - No edema Neuro - Normal strength, cranial nerves intact Skin - No rashes Psych - Normal mood, and behavior   CMP Latest Ref Rng & Units 02/13/2017 07/04/2016 06/30/2016  Glucose 65 - 99 mg/dL 126(H) 97 151(H)  BUN 6 - 20 mg/dL 15 7 6   Creatinine 0.44 - 1.00 mg/dL 1.05(H) 0.67 0.78  Sodium 135 - 145 mmol/L 138 137 137  Potassium 3.5 - 5.1 mmol/L 3.3(L) 4.5 3.6  Chloride 101 - 111 mmol/L 102 103 102  CO2 22 - 32 mmol/L 25 28 29   Calcium 8.9 - 10.3 mg/dL 8.8(L) 8.5(L) 8.5(L)     CBC Latest Ref Rng & Units 02/13/2017 07/04/2016 06/30/2016  WBC 4.0 - 10.5 K/uL 8.4 6.5 6.0  Hemoglobin 12.0 - 15.0 g/dL 11.6(L) 10.9(L) 11.6(L)  Hematocrit 36.0 - 46.0 % 35.2(L) 33.3(L) 35.8(L)  Platelets 150 - 400 K/uL 214 286 18    Discussion 76 yo female former smoker with progressive dyspnea on exertion and hypoxia.  She has prominent interstitial markings on recent chest xray.  I am concerned that she could have interstitial lung disease.  Assessment/plan  Dyspnea on exertion, hypoxic respiratory failure with concern for ILD. - will arrange for high resolution CT chest and pulmonary function testing - will arrange for serology - depending on results will determine further interventions - continue supplemental oxygen   Morbid obesity with deconditioning. - certainly this is contributing to her dyspnea, but I don't think this is the sole culprit - discussed importance of weight loss  Hx of hypertension. - she might need Echo to assess for LV  systolic/diastolic function and pulmonary pressures    Patient Instructions  Lab tests today  Will schedule CT chest and pulmonary function test  Follow up in 4 weeks with Dr. Halford Chessman or Nurse Practitioner    Chesley Mires, MD [facility] Pager:  [phone] 02/15/2017, 3:16 PM

## 2017-02-22 ENCOUNTER — Ambulatory Visit
Admission: RE | Admit: 2017-02-22 | Discharge: 2017-02-22 | Disposition: A | Discharge status: Home / Self Care | Payer: Medicare Other | Source: Ambulatory Visit | Attending: Pulmonary Disease | Admitting: Pulmonary Disease

## 2017-02-22 DIAGNOSIS — J849 Interstitial pulmonary disease, unspecified: Secondary | ICD-10-CM | POA: Diagnosis present

## 2017-02-22 DIAGNOSIS — R911 Solitary pulmonary nodule: Secondary | ICD-10-CM | POA: Diagnosis not present

## 2017-02-22 DIAGNOSIS — I2510 Atherosclerotic heart disease of native coronary artery without angina pectoris: Secondary | ICD-10-CM | POA: Diagnosis not present

## 2017-02-22 DIAGNOSIS — I700 Atherosclerosis of aorta: Secondary | ICD-10-CM | POA: Diagnosis not present

## 2017-02-22 DIAGNOSIS — R918 Other nonspecific abnormal finding of lung field: Secondary | ICD-10-CM | POA: Insufficient documentation

## 2017-02-25 ENCOUNTER — Telehealth: Payer: Self-pay | Admitting: Pulmonary Disease

## 2017-02-25 DIAGNOSIS — R0609 Other forms of dyspnea: Principal | ICD-10-CM

## 2017-02-25 DIAGNOSIS — J849 Interstitial pulmonary disease, unspecified: Secondary | ICD-10-CM

## 2017-02-25 NOTE — Telephone Encounter (Signed)
PFT scheduled 9/18 prior to OV with TP Two lab orders corrected as they were not placed as future.  Pt aware she may come next week to have these drawn. Nothing further needed.

## 2017-02-25 NOTE — Telephone Encounter (Signed)
HRCT chest 02/22/17 >> atherosclerosis, 3 mm LUL nodule, patchy air trapping b/l, patchy reticulation and GGO b/l, minimal traction BTX   Results d/w pt.  Explained concern is that she has ILD possibly for chronic hypersensitivity pneumonitis.  Will have my nurse make sure she gets lab testing to include following: ANA with reflex, rheumatoid factor, cyclic citrul peptide antibody, Sjogrens A and B antibody, ANCA, hypersensitivity pneumonitis panel.  Please also make sure she has PFT scheduled.

## 2017-02-25 NOTE — Addendum Note (Signed)
Addended by: Virl Cagey on: 02/25/2017 04:20 PM   Modules accepted: Orders

## 2017-03-01 ENCOUNTER — Other Ambulatory Visit: Payer: Medicare Other

## 2017-03-01 DIAGNOSIS — R0609 Other forms of dyspnea: Secondary | ICD-10-CM | POA: Diagnosis not present

## 2017-03-01 DIAGNOSIS — J849 Interstitial pulmonary disease, unspecified: Secondary | ICD-10-CM | POA: Diagnosis not present

## 2017-03-02 LAB — CYCLIC CITRUL PEPTIDE ANTIBODY, IGG: Cyclic Citrullin Peptide Ab: <16 Units

## 2017-03-02 LAB — SJOGRENS SYNDROME-A EXTRACTABLE NUCLEAR ANTIBODY: ENA SSA (RO) Ab: <0.2 AI (ref 0.0–0.9)

## 2017-03-02 LAB — RHEUMATOID FACTOR: Rheumatoid fact SerPl-aCnc: <14 IU/mL (ref <14)

## 2017-03-02 LAB — ANA W/REFLEX IF POSITIVE: Anti Nuclear Antibody(ANA): NEGATIVE

## 2017-03-02 LAB — ANCA SCREEN W REFLEX TITER: ANCA Screen: NEGATIVE

## 2017-03-02 LAB — SJOGRENS SYNDROME-B EXTRACTABLE NUCLEAR ANTIBODY: ENA SSB (LA) Ab: <0.2 AI (ref 0.0–0.9)

## 2017-03-09 ENCOUNTER — Telehealth: Payer: Self-pay | Admitting: Pulmonary Disease

## 2017-03-09 NOTE — Telephone Encounter (Signed)
Serology 03/01/17 >> ANA negative, RF < 14, CCP < 16, ANCA negative, SSA/SSB < 0.2   Will have my nurse inform pt that lab tests were negative so far.   She was supposed to also have hypersensitivity pneumonitis panel ordered (please see phone note from 02/25/17).  It doesn't appear that this was done.  Please place order to have pt do hypersensitivity pneumonitis panel.

## 2017-03-10 ENCOUNTER — Other Ambulatory Visit: Payer: Self-pay | Admitting: *Deleted

## 2017-03-10 DIAGNOSIS — R0609 Other forms of dyspnea: Principal | ICD-10-CM

## 2017-03-10 NOTE — Telephone Encounter (Signed)
Called and spoke with pt regarding the labwork that we had results of and told her that a hypersensitivity pneumonitis panel was supposed to have been done but doesn't appear to have been. Placed order for this to be done. Pt has an appt 03/15/17 and stated that she would stop by the lab either after or before that appt to have labwork done so we can get the results from this test.  Nothing further needed at this time.

## 2017-03-15 ENCOUNTER — Ambulatory Visit (INDEPENDENT_AMBULATORY_CARE_PROVIDER_SITE_OTHER): Payer: Medicare Other | Admitting: Adult Health

## 2017-03-15 ENCOUNTER — Ambulatory Visit (INDEPENDENT_AMBULATORY_CARE_PROVIDER_SITE_OTHER): Payer: Medicare Other | Admitting: Pulmonary Disease

## 2017-03-15 ENCOUNTER — Encounter: Payer: Self-pay | Admitting: Adult Health

## 2017-03-15 ENCOUNTER — Other Ambulatory Visit: Payer: Medicare Other

## 2017-03-15 VITALS — BP 112/68 | HR 57 | Ht 60.0 in | Wt 259.2 lb

## 2017-03-15 DIAGNOSIS — R0609 Other forms of dyspnea: Secondary | ICD-10-CM | POA: Diagnosis not present

## 2017-03-15 DIAGNOSIS — J849 Interstitial pulmonary disease, unspecified: Secondary | ICD-10-CM | POA: Insufficient documentation

## 2017-03-15 DIAGNOSIS — R0602 Shortness of breath: Secondary | ICD-10-CM | POA: Diagnosis not present

## 2017-03-15 DIAGNOSIS — J9611 Chronic respiratory failure with hypoxia: Secondary | ICD-10-CM | POA: Diagnosis not present

## 2017-03-15 LAB — PULMONARY FUNCTION TEST
DL/VA % pred: 121 %
DL/VA: 5.14 ml/min/mmHg/L
DLCO COR % PRED: 68 %
DLCO cor: 12.91 ml/min/mmHg
DLCO unc % pred: 70 %
DLCO unc: 13.25 ml/min/mmHg
FEF 25-75 POST: 2.85 L/s
FEF 25-75 Pre: 2.46 L/sec
FEF2575-%Change-Post: 15 %
FEF2575-%Pred-Post: 207 %
FEF2575-%Pred-Pre: 179 %
FEV1-%CHANGE-POST: 2 %
FEV1-%Pred-Post: 82 %
FEV1-%Pred-Pre: 80 %
FEV1-POST: 1.4 L
FEV1-Pre: 1.37 L
FEV1FVC-%Change-Post: 3 %
FEV1FVC-%PRED-PRE: 123 %
FEV6-%Change-Post: -1 %
FEV6-%PRED-POST: 67 %
FEV6-%PRED-PRE: 68 %
FEV6-POST: 1.45 L
FEV6-PRE: 1.47 L
FEV6FVC-%Change-Post: 0 %
FEV6FVC-%PRED-POST: 106 %
FEV6FVC-%Pred-Pre: 106 %
FVC-%CHANGE-POST: -1 %
FVC-%PRED-POST: 63 %
FVC-%PRED-PRE: 64 %
FVC-POST: 1.45 L
FVC-PRE: 1.47 L
PRE FEV6/FVC RATIO: 100 %
Post FEV1/FVC ratio: 96 %
Post FEV6/FVC ratio: 100 %
Pre FEV1/FVC ratio: 93 %
RV % pred: 54 %
RV: 1.14 L
TLC % PRED: 64 %
TLC: 2.86 L

## 2017-03-15 NOTE — Assessment & Plan Note (Signed)
Exertional hypoxia possibly due to ILD changes .  She has peripheral edema , BNP was ok . Will check Echo for diastolic dysfunction and also PAP as OSA -untreated.   Plan  Patient Instructions  CT chest showed some Interstitial lung disease changes . We are looking of possible causes.  Set up for 2 D echo .  Wear oxygen with activity and At bedtime   Goal is for oxygen level >90%  Will call with lab results.  Follow up with Dr. Halford Chessman  In 4-6 weeks and As needed   Please contact office for sooner follow up if symptoms do not improve or worsen or seek emergency care

## 2017-03-15 NOTE — Patient Instructions (Addendum)
CT chest showed some Interstitial lung disease changes . We are looking of possible causes.  Set up for 2 D echo .  Wear oxygen with activity and At bedtime   Goal is for oxygen level >90%  Will call with lab results.  Follow up with Dr. Halford Chessman  In 4-6 weeks and As needed   Please contact office for sooner follow up if symptoms do not improve or worsen or seek emergency care

## 2017-03-15 NOTE — Assessment & Plan Note (Signed)
Mild ILD changes on CT chest  Moderate restrictions and Diffucing defect on PFT .  Autoimmune/CTD workup neg thus far Follow up HSP , if neg will need to decide on return if proceed with therapy possible antifibroitics OFEV/Esbriet vs clinical monitoring /serial PFT/CT    Plan  Patient Instructions  CT chest showed some Interstitial lung disease changes . We are looking of possible causes.  Set up for 2 D echo .  Wear oxygen with activity and At bedtime   Goal is for oxygen level >90%  Will call with lab results.  Follow up with Dr. Halford Chessman  In 4-6 weeks and As needed   Please contact office for sooner follow up if symptoms do not improve or worsen or seek emergency care

## 2017-03-15 NOTE — Progress Notes (Signed)
PFT completed today 03/15/17.  

## 2017-03-15 NOTE — Progress Notes (Signed)
@Patient  ID: Kathy [name], female    DOB: 06-07-1941, 76 y.o.   MRN: [id number]  Chief Complaint  Patient presents with  . Follow-up    Referring provider: MoltRomelle Starcher, DO  HPI: 76 yo female former smoker ( 1988) seen for pulmonary consult 02/15/17 for dyspnea and hypoxia .    TEST  03/01/17 >> ANA negative, RF < 14, CCP < 16, ANCA negative, SSA/SSB < 0.2  03/15/2017 Follow up : Dyspnea /ILD  Pt returns for a 1 month follow up . She complains of 10  years of progressive DOE. She developed PNA and Flu in Jan 2018 . Since then dyspnea has been worse with associated dry cough . She was started on Oxygen 2l/m with act and At bedtime  . She says she has OSA but is unable to tolerate CPAP .   CXR showed interstitial changes.  Worked at [employer] for 6 yr . No Amiodarone , macrobid , or MTX use/exposure.  Second hand smoke from husband.  She denies orthopnea but has significant ankle swelling .  Labs done last ov have been neg for CTD/Autoimmune. HSP is pending .  HRCT showed 3 mm left upper lobe nodule, patchy air trapping bilaterally, patchy reticulation and groundglass opacities bilaterally with minimum traction bronchiectasis. This was concerning for ILD with a possible chronic hypersensitivity pneumonitis component. PFT was done today that showed moderate restriction with no airflow obstruction. FEV1 was 82%, ratio 96, FVC 63%, DLCO 70%, no significant bronchodilator response. Total lung capacity 64%. We reviewed all her test results. She says she has daytime fatigue. Get short of breath with prolonged walking. It has intermittent dry cough with occasional severe coughing paroxysms. She denies any overt reflux or sinus symptoms. No hemoptysis, chest pain, nausea, vomiting, or unintentional weight loss.   Allergies  Allergen Reactions  . Lipitor [Atorvastatin] Other (See Comments)    Memory issues  . Requip [Ropinirole Hcl] Other (See Comments)    Pt reports feeling generally unwell on  this medication    Immunization History  Administered Date(s) Administered  . Pneumococcal Polysaccharide-23 06/30/2016    Past Medical History:  Diagnosis Date  . Arthritis    fingers  . Depression   . Dizziness    in AM, getting out of bed  . Dysrhythmia    "skips a beat" sometimes - followed by PCP  . Hypercholesteremia   . Hypertension   . Neuropathy    bilateral feet  . Shortness of breath dyspnea   . Sleep apnea    has CPAP, doesn't use  . Umbilical hernia     Tobacco History: History  Smoking Status  . Former Smoker  . Packs/day: 1.00  . Years: 29.00  . Types: Cigarettes  . Quit date: 06/28/1986  Smokeless Tobacco  . Never Used    Comment: quit 40+ yrs ago, 1 PPD for a few years   Counseling given: Not Answered   Outpatient Encounter Prescriptions as of 03/15/2017  Medication Sig  . albuterol (PROVENTIL HFA;VENTOLIN HFA) 108 (90 Base) MCG/ACT inhaler Inhale 1-2 puffs into the lungs every 6 (six) hours as needed for wheezing or shortness of breath.  . benzonatate (TESSALON) 100 MG capsule Take 1 capsule (100 mg total) by mouth every 8 (eight) hours.  . fenofibrate micronized (LOFIBRA) 134 MG capsule Take 134 mg by mouth daily before breakfast.  . furosemide (LASIX) 80 MG tablet Take 80 mg by mouth every morning.  Marland Kitchen losartan (COZAAR) 100 MG tablet Take  100 mg by mouth daily.  . meloxicam (MOBIC) 15 MG tablet Take 15 mg by mouth daily.   . metoprolol succinate (TOPROL-XL) 50 MG 24 hr tablet Take 50 mg by mouth daily.  Marland Kitchen venlafaxine XR (EFFEXOR-XR) 150 MG 24 hr capsule Take 150 mg by mouth daily.   No facility-administered encounter medications on file as of 03/15/2017.      Review of Systems  Constitutional:   No  weight loss, night sweats,  Fevers, chills,  +fatigue, or  lassitude.  HEENT:   No headaches,  Difficulty swallowing,  Tooth/dental problems, or  Sore throat,                No sneezing, itching, ear ache, nasal congestion, post nasal drip,    CV:  No chest pain,  Orthopnea, PND, swelling in lower extremities, anasarca, dizziness, palpitations, syncope.   GI  No heartburn, indigestion, abdominal pain, nausea, vomiting, diarrhea, change in bowel habits, loss of appetite, bloody stools.   Resp:  .  No chest wall deformity  Skin: no rash or lesions.  GU: no dysuria, change in color of urine, no urgency or frequency.  No flank pain, no hematuria   MS:  No joint pain or swelling.  No decreased range of motion.  No back pain.    Physical Exam  BP 112/68 (BP Location: Left Arm, Cuff Size: Large)   Pulse (!) 57   Ht 5' (1.524 m)   Wt 259 lb 3.2 oz (117.6 kg)   SpO2 97%   BMI 50.62 kg/m   GEN: A/Ox3; pleasant , NAD , obese,    HEENT:  [place]/AT,  EACs-clear, TMs-wnl, NOSE-clear, THROAT-clear, no lesions, no postnasal drip or exudate noted. Class 2-3 MP airway   NECK:  Supple w/ fair ROM; no JVD; normal carotid impulses w/o bruits; no thyromegaly or nodules palpated; no lymphadenopathy.    RESP  Clear  P & A; w/o, wheezes/ rales/ or rhonchi. no accessory muscle use, no dullness to percussion  CARD:  RRR, no m/r/g, 1-2+ peripheral edema, pulses intact, no cyanosis or clubbing.  GI:   Soft & nt; nml bowel sounds; no organomegaly or masses detected.   Musco: Warm bil, no deformities or joint swelling noted.   Neuro: alert, no focal deficits noted.    Skin: Warm, no lesions or rashes    Lab Results:  CBC  BMET  ProBNP No results found for: PROBNP  Imaging: Dg Chest 2 View  Result Date: 02/13/2017 CLINICAL DATA:  Shortness of breath and hypoxia EXAM: CHEST  2 VIEW COMPARISON:  06/30/2016 FINDINGS: Cardiac shadow is stable. Aortic calcifications are again seen. Mild interstitial changes are noted but improved when compare with the prior exam. No sizable effusion is noted. No focal infiltrate is seen. No bony abnormality is noted. IMPRESSION: Improved aeration bilaterally with some persistent interstitial changes  likely of a chronic nature. Electronically Signed   By: Inez Catalina M.D.   On: 02/13/2017 17:02   Ct Chest High Resolution  Result Date: 02/22/2017 CLINICAL DATA:  Worsening dyspnea with exertion and hypoxia. History of endometrial cancer. Remote smoking history. EXAM: CT CHEST WITHOUT CONTRAST TECHNIQUE: Multidetector CT imaging of the chest was performed following the standard protocol without intravenous contrast. High resolution imaging of the lungs, as well as inspiratory and expiratory imaging, was performed. COMPARISON:  02/13/2017 chest radiograph. FINDINGS: Cardiovascular: Top-normal heart size. No significant pericardial fluid/thickening. Left main, left anterior descending, left circumflex and right coronary atherosclerosis. Atherosclerotic nonaneurysmal  thoracic aorta. Top-normal caliber main pulmonary artery (3.3 cm diameter). Mediastinum/Nodes: No discrete thyroid nodules. Unremarkable esophagus. No pathologically enlarged axillary, mediastinal or gross hilar lymph nodes, noting limited sensitivity for the detection of hilar adenopathy on this noncontrast study. Lungs/Pleura: No pneumothorax. No pleural effusion. No acute consolidative airspace disease or lung masses. Solid 3 mm left upper lobe pulmonary nodule (series 3/image 26). There is extensive patchy air trapping throughout both lungs on the expiration sequence. There is patchy peribronchovascular and subpleural reticulation and ground-glass attenuation throughout both lungs with associated mild architectural distortion. There is minimal traction bronchiolectasis scattered in both lungs, most prominent in the left upper lobe. No frank honeycombing. No clear basilar gradient to these findings. Upper abdomen: Cholecystectomy. Simple 1.9 cm posterior upper right renal cyst. Partially visualized right extrarenal pelvis, not appreciably changed since 08/01/2014 CT abdomen/ pelvis study. Musculoskeletal: No aggressive appearing focal osseous  lesions. Mild thoracic spondylosis. IMPRESSION: 1. Spectrum of findings suggestive of interstitial lung disease characterized by extensive patchy air trapping, patchy peribronchovascular and subpleural reticulation and ground-glass attenuation, mild architectural distortion and minimal traction bronchiolectasis. No clear basilar gradient. No frank honeycombing. 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 Nonspecific interstitial pneumonia (NSIP) is a consideration. These findings are not compatible with usual interstitial pneumonia (UIP). 2. Solitary tiny 3 mm solid left upper lobe pulmonary nodule. No follow-up needed if patient is low-risk. Non-contrast chest CT can be considered in 12 months if patient is high-risk. This recommendation follows the consensus statement: Guidelines for Management of Incidental Pulmonary Nodules Detected on CT Images: From the Fleischner Society 2017; Radiology 2017; 284:228-243. 3. Left main and 3 vessel coronary atherosclerosis. Aortic Atherosclerosis (ICD10-I70.0). Electronically Signed   By: Ilona Sorrel M.D.   On: 02/22/2017 15:42     Assessment & Plan:   ILD (interstitial lung disease) ([place]) Mild ILD changes on CT chest  Moderate restrictions and Diffucing defect on PFT .  Autoimmune/CTD workup neg thus far Follow up HSP , if neg will need to decide on return if proceed with therapy possible antifibroitics OFEV/Esbriet vs clinical monitoring /serial PFT/CT    Plan  Patient Instructions  CT chest showed some Interstitial lung disease changes . We are looking of possible causes.  Set up for 2 D echo .  Wear oxygen with activity and At bedtime   Goal is for oxygen level >90%  Will call with lab results.  Follow up with Dr. Halford Chessman  In 4-6 weeks and As needed   Please contact office for sooner follow up if symptoms do not improve or worsen or seek emergency care      Chronic respiratory failure with hypoxia ([place]) Exertional  hypoxia possibly due to ILD changes .  She has peripheral edema , BNP was ok . Will check Echo for diastolic dysfunction and also PAP as OSA -untreated.   Plan  Patient Instructions  CT chest showed some Interstitial lung disease changes . We are looking of possible causes.  Set up for 2 D echo .  Wear oxygen with activity and At bedtime   Goal is for oxygen level >90%  Will call with lab results.  Follow up with Dr. Halford Chessman  In 4-6 weeks and As needed   Please contact office for sooner follow up if symptoms do not improve or worsen or seek emergency care         Rexene Edison, NP 03/15/2017

## 2017-03-16 NOTE — Progress Notes (Signed)
I have reviewed and agree with assessment/plan.  Chesley Mires, MD [facility]monary/Critical Care 03/16/2017, 10:16 AM Pager:  [phone]

## 2017-03-18 ENCOUNTER — Telehealth: Payer: Self-pay | Admitting: Pulmonary Disease

## 2017-03-18 DIAGNOSIS — M1710 Unilateral primary osteoarthritis, unspecified knee: Secondary | ICD-10-CM | POA: Diagnosis not present

## 2017-03-18 DIAGNOSIS — G4730 Sleep apnea, unspecified: Secondary | ICD-10-CM | POA: Diagnosis not present

## 2017-03-18 DIAGNOSIS — E7800 Pure hypercholesterolemia, unspecified: Secondary | ICD-10-CM | POA: Diagnosis not present

## 2017-03-18 DIAGNOSIS — F329 Major depressive disorder, single episode, unspecified: Secondary | ICD-10-CM | POA: Diagnosis not present

## 2017-03-18 DIAGNOSIS — Z79899 Other long term (current) drug therapy: Secondary | ICD-10-CM | POA: Diagnosis not present

## 2017-03-18 DIAGNOSIS — Z6841 Body Mass Index (BMI) 40.0 and over, adult: Secondary | ICD-10-CM | POA: Diagnosis not present

## 2017-03-18 DIAGNOSIS — R7303 Prediabetes: Secondary | ICD-10-CM | POA: Diagnosis not present

## 2017-03-18 DIAGNOSIS — R600 Localized edema: Secondary | ICD-10-CM | POA: Diagnosis not present

## 2017-03-18 DIAGNOSIS — R0602 Shortness of breath: Secondary | ICD-10-CM | POA: Diagnosis not present

## 2017-03-18 DIAGNOSIS — I10 Essential (primary) hypertension: Secondary | ICD-10-CM | POA: Diagnosis not present

## 2017-03-18 LAB — HYPERSENSITIVITY PNEUMONITIS
A. FUMIGATUS #1 ABS: NEGATIVE
A. PULLULANS ABS: NEGATIVE
MICROPOLYSPORA FAENI IGG: NEGATIVE
Pigeon Serum Abs: NEGATIVE
THERMOACT. SACCHARII: NEGATIVE
THERMOACTINOMYCES VULGARIS IGG: NEGATIVE

## 2017-03-18 NOTE — Telephone Encounter (Signed)
lmtcb for [employer].

## 2017-03-21 ENCOUNTER — Ambulatory Visit (HOSPITAL_COMMUNITY): Payer: Medicare Other | Attending: Cardiology

## 2017-03-21 ENCOUNTER — Telehealth: Payer: Self-pay | Admitting: Pulmonary Disease

## 2017-03-21 ENCOUNTER — Other Ambulatory Visit: Payer: Self-pay

## 2017-03-21 DIAGNOSIS — R0602 Shortness of breath: Secondary | ICD-10-CM

## 2017-03-21 DIAGNOSIS — I5030 Unspecified diastolic (congestive) heart failure: Secondary | ICD-10-CM | POA: Diagnosis not present

## 2017-03-21 DIAGNOSIS — I420 Dilated cardiomyopathy: Secondary | ICD-10-CM | POA: Diagnosis not present

## 2017-03-21 NOTE — Telephone Encounter (Signed)
Called to speak with patient, no voicemail set up at this time for patient to return call back regarding results today. Also left VM on patient's cell phone to return call.

## 2017-03-21 NOTE — Telephone Encounter (Signed)
LM for Jeanette Caprice that records faxed no pt.

## 2017-03-21 NOTE — Telephone Encounter (Signed)
HP panel 03/15/17 >> negative    Will have my nurse inform pt that lab test was normal.  No change to current plan of care.

## 2017-03-21 NOTE — Telephone Encounter (Signed)
Jeanette Caprice is returning call from Friday on [name].

## 2017-03-22 ENCOUNTER — Institutional Professional Consult (permissible substitution): Payer: Medicare Other | Admitting: Pulmonary Disease

## 2017-03-25 NOTE — Telephone Encounter (Signed)
Spoke with patient to inform her of results and recommendations per VS. The patient verbalized understanding and denied any questions or concerns regarding the results at this time. Patient requested to be seen earlier than appointment of 04/12/17; changed the appointment this morning per patients request to mon 04/04/17 at 3:30pm with TP for ROV.

## 2017-04-04 ENCOUNTER — Encounter: Payer: Self-pay | Admitting: Adult Health

## 2017-04-04 ENCOUNTER — Ambulatory Visit (INDEPENDENT_AMBULATORY_CARE_PROVIDER_SITE_OTHER): Payer: Medicare Other | Admitting: Adult Health

## 2017-04-04 DIAGNOSIS — J9611 Chronic respiratory failure with hypoxia: Secondary | ICD-10-CM | POA: Diagnosis not present

## 2017-04-04 DIAGNOSIS — Z23 Encounter for immunization: Secondary | ICD-10-CM

## 2017-04-04 DIAGNOSIS — J849 Interstitial pulmonary disease, unspecified: Secondary | ICD-10-CM

## 2017-04-04 NOTE — Patient Instructions (Addendum)
Wear oxygen with activity and At bedtime   Goal is for oxygen level >90%  Follow up with Dr. Halford Chessman  In 3-4 months  and As needed   Please contact office for sooner follow up if symptoms do not improve or worsen or seek emergency care  Flu shot today .  Order for POC oxygen device.

## 2017-04-04 NOTE — Assessment & Plan Note (Signed)
Cont on o2 with act and At bedtime  POC order to DME .

## 2017-04-04 NOTE — Assessment & Plan Note (Signed)
NSIP changes on CT . Workup is neg for autoimmune /CTD .  Declines OFEV/Esbriet at this time.   Plan  Cont to montior clinically .

## 2017-04-04 NOTE — Progress Notes (Signed)
@Patient  ID: Kathy [name], female    DOB: Oct 28, 1940, 76 y.o.   MRN: [id number]  Chief Complaint  Patient presents with  . 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  O2 RF on O2 at 2l/m w/ act and At bedtime   Has OSA -CPAP intolerant .    TEST  ILD workup :  03/01/17 >> ANA negative, RF < 14, CCP < 16, ANCA negative, SSA/SSB < 0.50m HSP neg  HRCT showed 3 mm left upper lobe nodule, patchy air trapping bilaterally, patchy reticulation and groundglass opacities bilaterally with minimum traction bronchiectasis. This was concerning for ILD with a possible chronic hypersensitivity pneumonitis component. PFT was done today that showed moderate restriction with no airflow obstruction. FEV1 was 82%, ratio 96, FVC 63%, DLCO 70%, no significant bronchodilator response. Total lung capacity 64% Worked at [employer] for 6 yr . No Amiodarone , macrobid , or MTX use/exposure.  Second hand smoke from husband.  Echo EF 65-70%, Grade 1 DD .   04/04/2017 Follow up : ILD-NSIP  , O2 RF  Patient presents for a two-week follow-up. Patient was seen in August for dyspnea and hypoxemia. A CT chest showed patchy reticulation and groundglass opacities bilateral with minimal traction bronchiectasis. 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 Autoimmune and connective tissue workup was negative. Hypersensitivity pneumonitis panel was negative. A function time showed moderate restriction with no airflow obstruction. Kathy [name] was 70%. Patient has known sleep apnea but is intolerant to C Pap. Patient has some chronic lower extremity swelling is on Lasix. A 2-D echo was set up that showed an EF of 09-32%, grade 1 diastolic dysfunction. Patient says that she continues to have some shortness of breath with walking if she has to walk a long distance or go up a  incline. She denies any flare of cough. Oxygen 2 [name] with activity and at bedtime.. She needs a portable oxygen device to be able to be more mobile. We discussed all of her test results. Went over potential treatment options including antibiotics with Esbriet and OFEV. She declines at this time. Says she does not want to take any more meds.  She would like to get her flu shot today .       Allergies  Allergen Reactions  . Lipitor [Atorvastatin] Other (See Comments)    Memory issues  . Requip [Ropinirole Hcl] Other (See Comments)    Pt reports feeling generally unwell on this medication    Immunization History  Administered Date(s) Administered  . Influenza, High Dose Seasonal PF 04/04/2017  . Pneumococcal Polysaccharide-23 06/30/2016    Past Medical History:  Diagnosis Date  . Arthritis    fingers  . Depression   . Dizziness    in AM, getting out of bed  . Dysrhythmia    "skips a beat" sometimes - followed by PCP  . Hypercholesteremia   . Hypertension   . Neuropathy    bilateral feet  . Shortness of breath dyspnea   . Sleep apnea    has CPAP, doesn't use  . Umbilical hernia     Tobacco History: History  Smoking Status  . Former Smoker  . Packs/day: 1.00  . Years: 29.00  . Types: Cigarettes  . Quit date: 06/28/1986  Smokeless Tobacco  . Never Used  Comment: quit 40+ yrs ago, 1 PPD for a few years   Counseling given: Not Answered   Outpatient Encounter Prescriptions as of 04/04/2017  Medication Sig  . albuterol (PROVENTIL HFA;VENTOLIN HFA) 108 (90 Base) MCG/ACT inhaler Inhale 1-2 puffs into the lungs every 6 (six) hours as needed for wheezing or shortness of breath.  . etodolac (LODINE) 300 MG capsule Take 300 mg by mouth 2 (two) times daily.  . fenofibrate micronized (LOFIBRA) 134 MG capsule Take 134 mg by mouth daily before breakfast.  . furosemide (LASIX) 80 MG tablet Take 80 mg by mouth every morning.  Marland Kitchen losartan (COZAAR) 100 MG tablet Take 100 mg by mouth  daily.  . meloxicam (MOBIC) 15 MG tablet Take 15 mg by mouth daily.   . metoprolol succinate (TOPROL-XL) 50 MG 24 hr tablet Take 50 mg by mouth daily.  Marland Kitchen venlafaxine XR (EFFEXOR-XR) 150 MG 24 hr capsule Take 150 mg by mouth daily.  . benzonatate (TESSALON) 100 MG capsule Take 1 capsule (100 mg total) by mouth every 8 (eight) hours. (Patient not taking: Reported on 04/04/2017)   No facility-administered encounter medications on file as of 04/04/2017.      Review of Systems  Constitutional:   No  weight loss, night sweats,  Fevers, chills, fatigue, or  lassitude.  HEENT:   No headaches,  Difficulty swallowing,  Tooth/dental problems, or  Sore throat,                No sneezing, itching, ear ache, nasal congestion, post nasal drip,   CV:  No chest pain,  Orthopnea, PND, swelling in lower extremities, anasarca, dizziness, palpitations, syncope.   GI  No heartburn, indigestion, abdominal pain, nausea, vomiting, diarrhea, change in bowel habits, loss of appetite, bloody stools.   Resp:    No excess mucus, no productive cough,  No non-productive cough,  No coughing up of blood.  No change in color of mucus.  No wheezing.  No chest wall deformity  Skin: no rash or lesions.  GU: no dysuria, change in color of urine, no urgency or frequency.  No flank pain, no hematuria   MS:  No joint pain or swelling.  No decreased range of motion.  No back pain.    Physical Exam  BP 120/66 (BP Location: Left Arm, Cuff Size: Large)   Pulse 65   Ht 5' (1.524 m)   Wt 255 lb (115.7 kg)   SpO2 93%   BMI 49.80 kg/m   GEN: A/Ox3; pleasant , NAD, elderly and obese    HEENT:  [place]/AT,  EACs-clear, TMs-wnl, NOSE-clear, THROAT-clear, no lesions, no postnasal drip or exudate noted.   NECK:  Supple w/ fair ROM; no JVD; normal carotid impulses w/o bruits; no thyromegaly or nodules palpated; no lymphadenopathy.    RESP  Clear  P & A; w/o, wheezes/ rales/ or rhonchi. no accessory muscle use, no dullness to  percussion  CARD:  RRR, no m/r/g, tr -1+ peripheral edema, pulses intact, no cyanosis or clubbing.  GI:   Soft & nt; nml bowel sounds; no organomegaly or masses detected.   Musco: Warm bil, no deformities or joint swelling noted.   Neuro: alert, no focal deficits noted.    Skin: Warm, no lesions or rashes    Lab Results:  CBC  BMET   ProBNP No results found for: PROBNP  Imaging: No results found.   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 Workup  is neg for autoimmune /CTD .  Declines OFEV/Esbriet at this time.   Plan  Cont to montior clinically .   Chronic respiratory failure with hypoxia (HCC) Cont on o2 with act and At bedtime  POC order to DME .      Rexene Edison, NP 04/04/2017

## 2017-04-04 NOTE — Progress Notes (Signed)
I have reviewed and agree with assessment/plan.  Chesley Mires, MD [facility] Pulmonary/Critical Care 04/04/2017, 5:04 PM Pager:  [phone]

## 2017-04-06 NOTE — Addendum Note (Signed)
Addended by: Parke Poisson E on: 04/06/2017 12:34 PM   Modules accepted: Orders

## 2017-04-12 ENCOUNTER — Ambulatory Visit: Payer: Medicare Other | Admitting: Adult Health

## 2017-04-30 IMAGING — CR DG CHEST 2V
2 series · 2 of 2 positions shown · non-contrast
Comparison: 06/27/2016 and earlier.

CLINICAL DATA: 75-year-old female with fever shortness of breath
and cough. Community-acquired pneumonia. Initial encounter.

EXAM:
CHEST  2 VIEW

[chest pa]
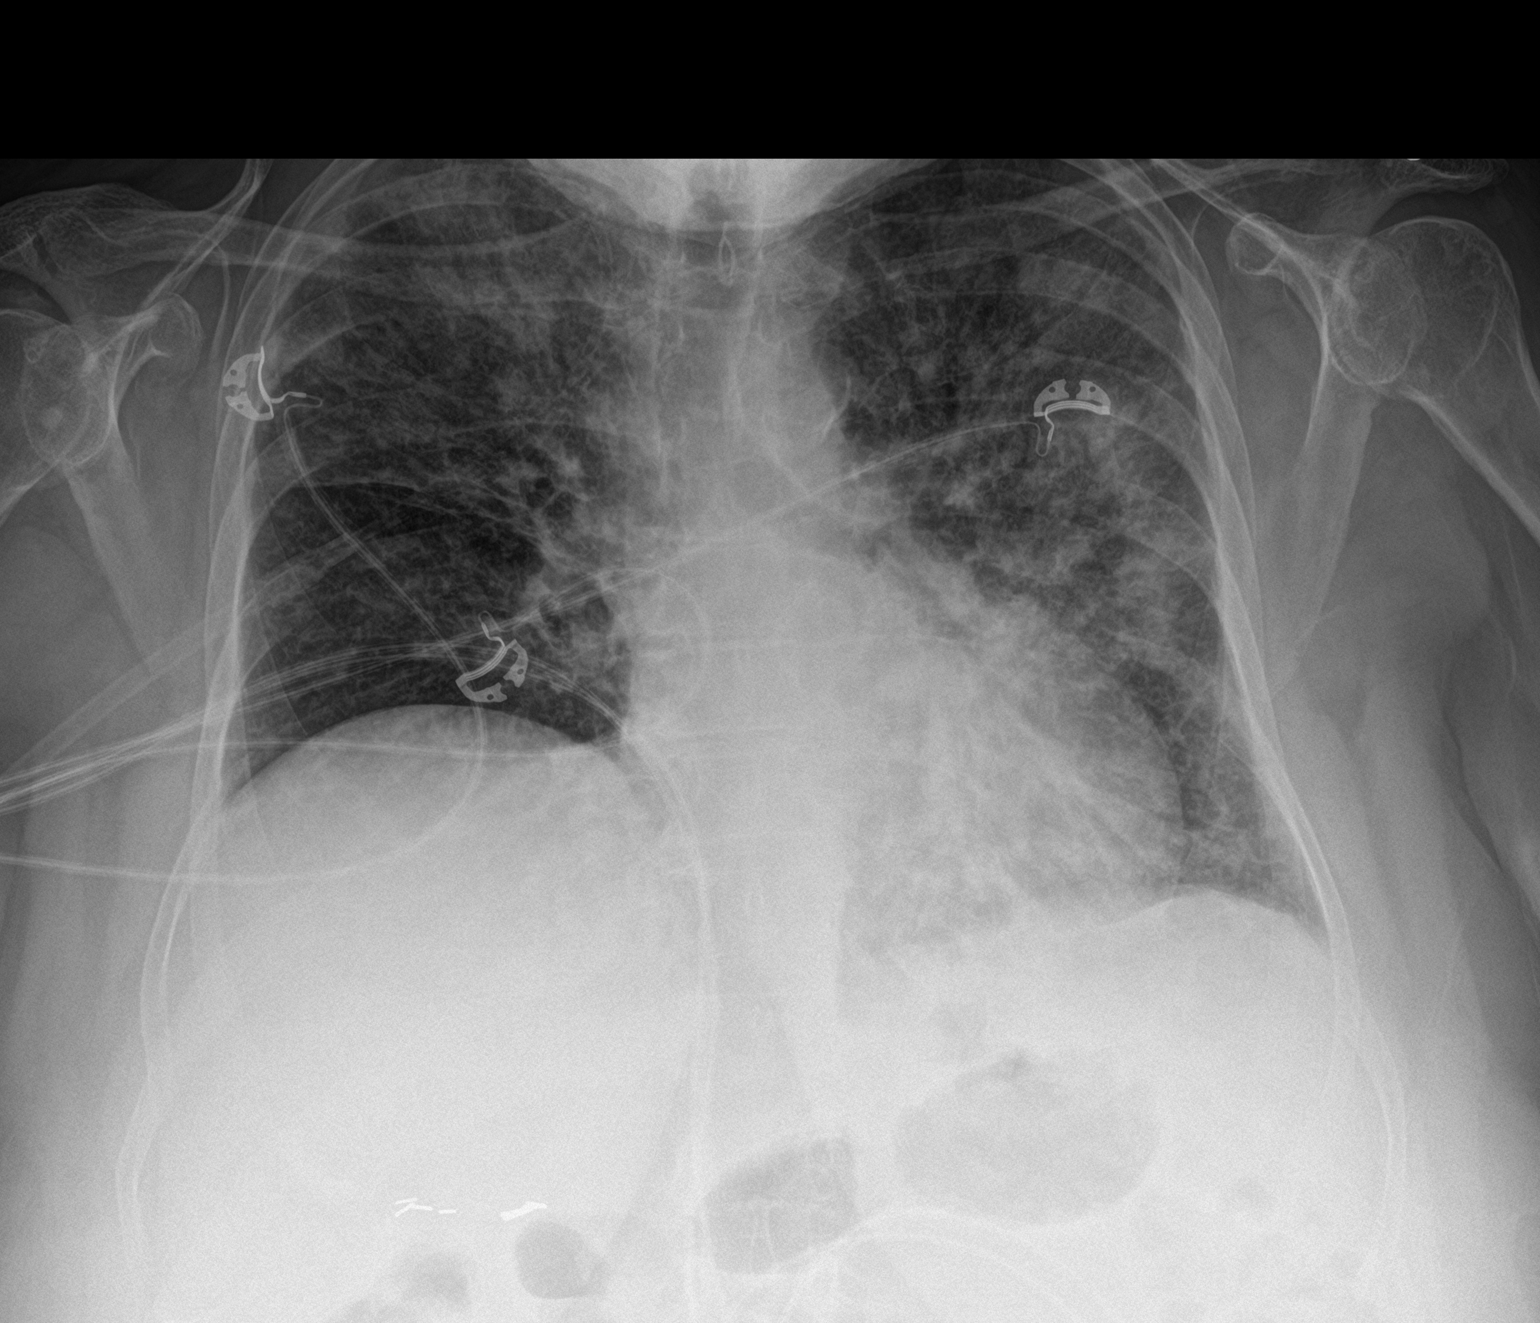

[chest lat]
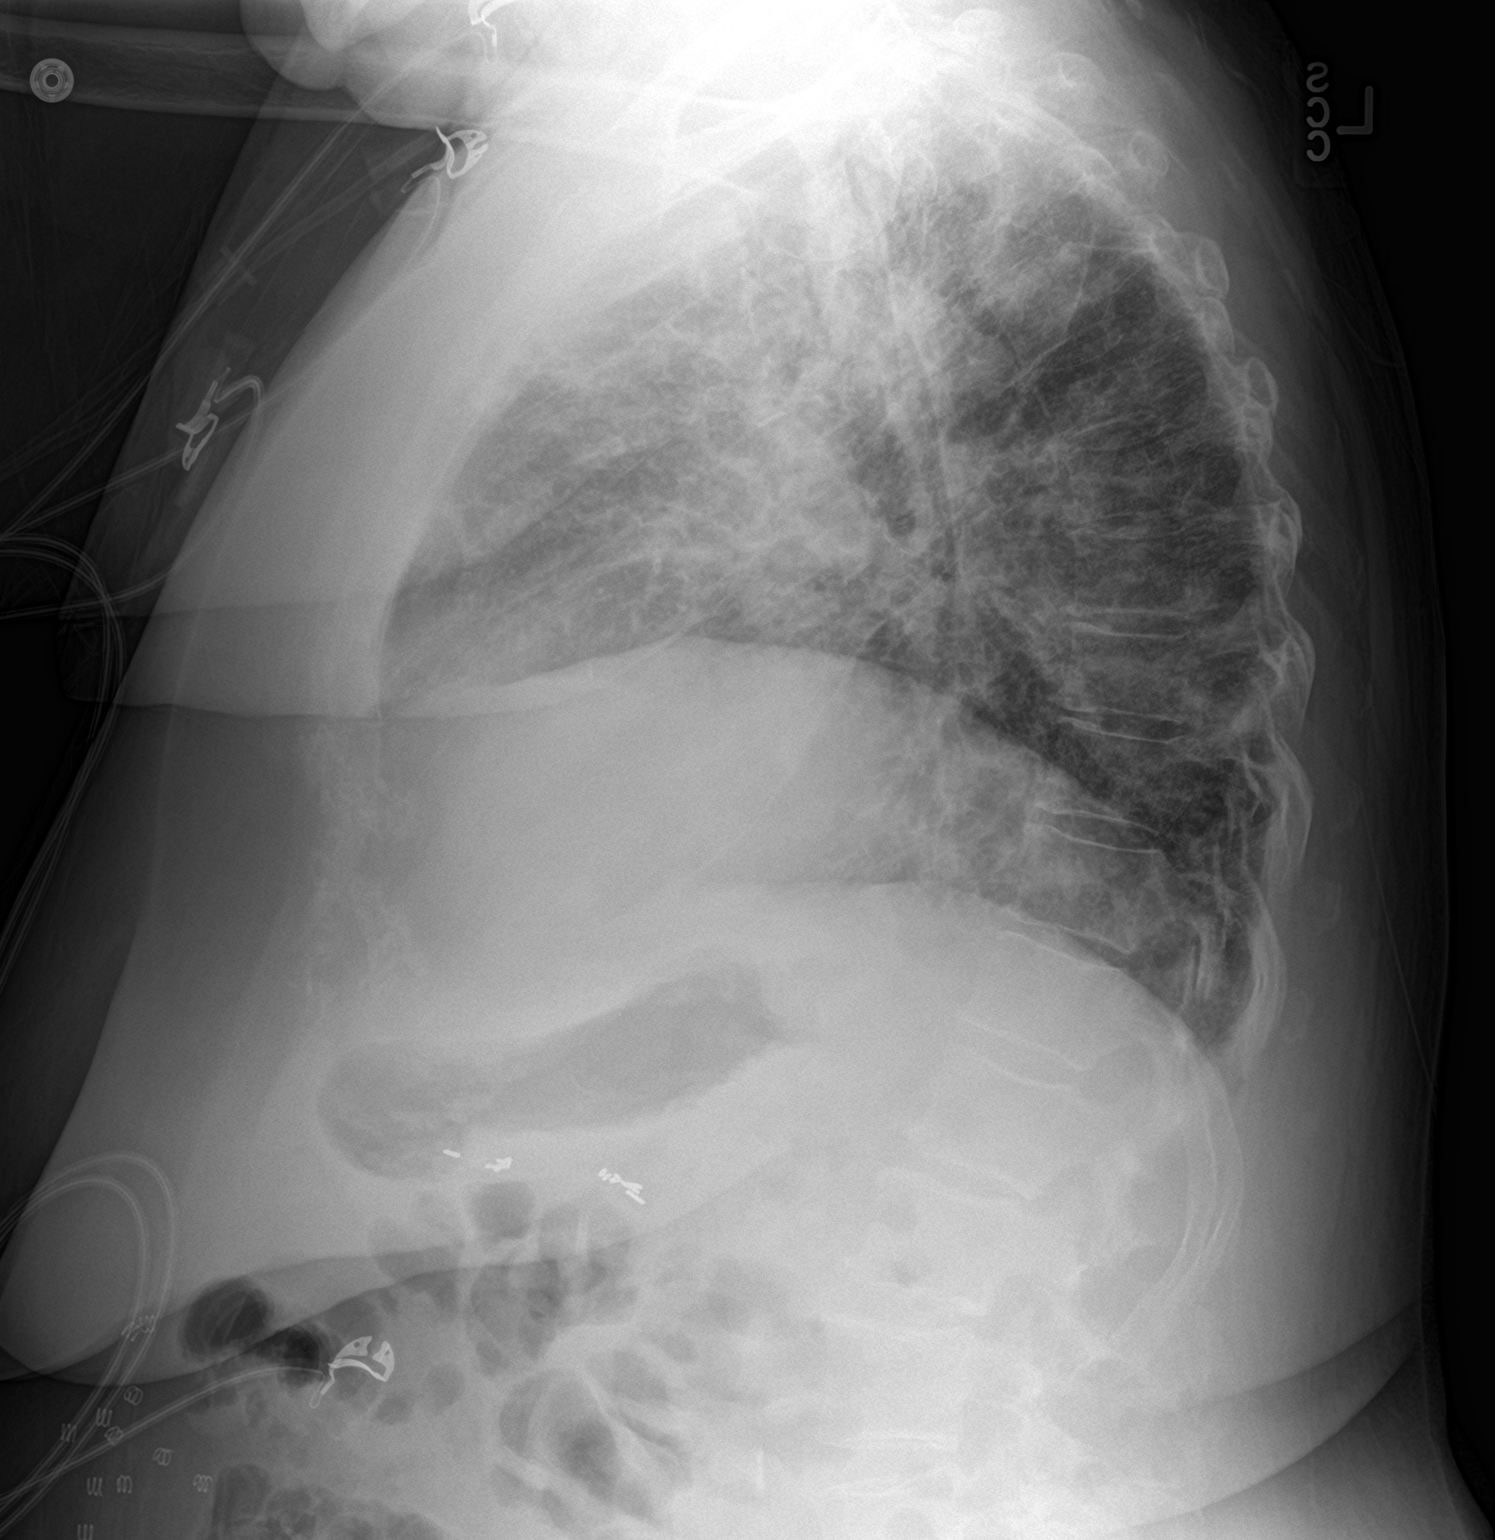

[2 of 2 positions shown; findings below may reference images not displayed]

FINDINGS: PA and lateral views of the chest. Improved lung volumes. Improved
bilateral ventilation, but significant residual widespread Patchy
and confluent peribronchial opacity. The majority of the left lung
is affected. On the right the upper lobe primarily is affected. No
superimposed pneumothorax or pleural effusion. Stable cardiac size
and mediastinal contours. Calcified aortic atherosclerosis. No acute
osseous abnormality identified. Stable cholecystectomy clips.
Negative visible bowel gas pattern. Sequelae of ventral abdominal
hernia repair partially visible.
IMPRESSION: Bilateral pneumonia/bronchopneumonia left greater than right with
improved ventilation since yesterday.

No pleural effusion or new cardiopulmonary abnormality.

## 2017-05-01 IMAGING — CR DG CHEST 1V PORT
1 series · 1 of 1 positions shown · non-contrast
Comparison: 06/28/2016

CLINICAL DATA: Shortness of Breath, hypertension

EXAM:
PORTABLE CHEST 1 VIEW

[AP]
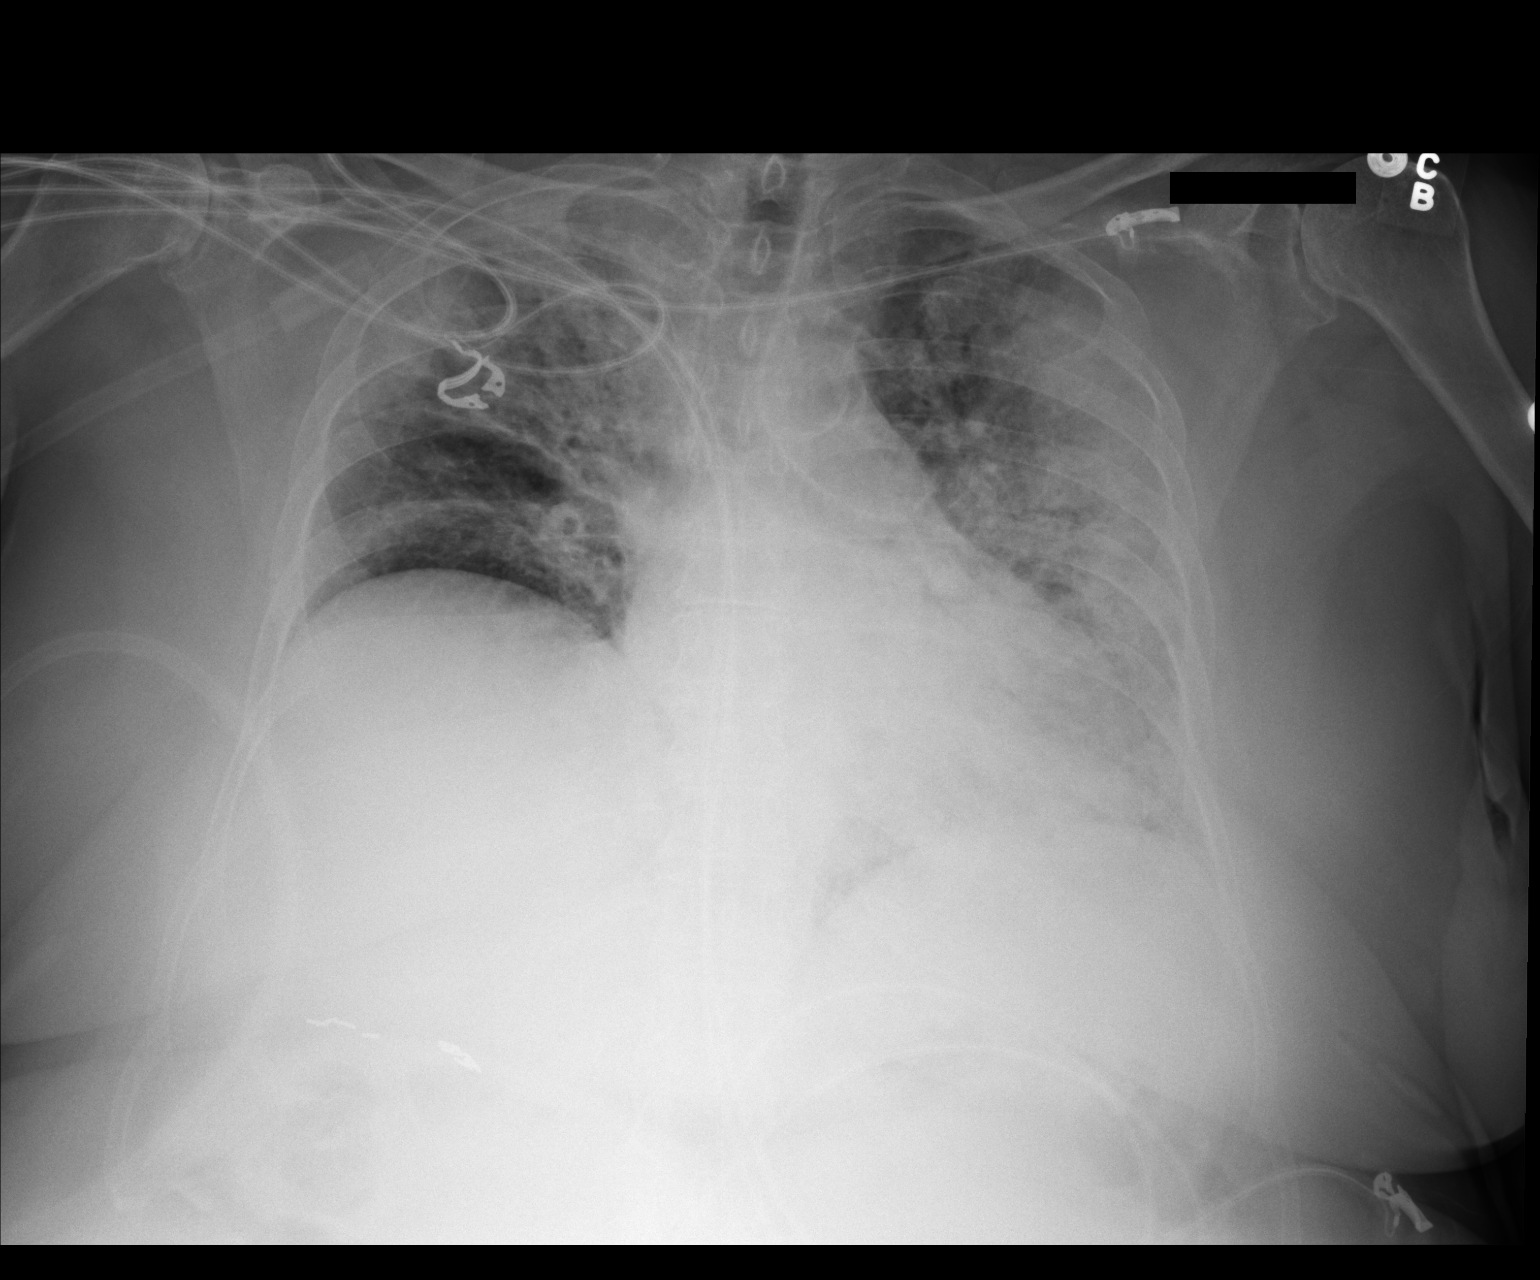

[1 of 1 positions shown; findings below may reference images not displayed]

FINDINGS: Patchy airspace disease throughout the lungs bilaterally, worsening
since prior study. Elevation of the right hemidiaphragm. Heart is
borderline in size.
IMPRESSION: Worsening patchy bilateral airspace disease, left greater than
right.

Elevated right hemidiaphragm.

## 2017-07-04 ENCOUNTER — Encounter: Payer: Self-pay | Admitting: Pulmonary Disease

## 2017-07-04 ENCOUNTER — Ambulatory Visit (INDEPENDENT_AMBULATORY_CARE_PROVIDER_SITE_OTHER): Payer: Medicare Other | Admitting: Pulmonary Disease

## 2017-07-04 VITALS — BP 120/80 | HR 59 | Ht 60.0 in | Wt 265.8 lb

## 2017-07-04 DIAGNOSIS — J9611 Chronic respiratory failure with hypoxia: Secondary | ICD-10-CM

## 2017-07-04 DIAGNOSIS — J849 Interstitial pulmonary disease, unspecified: Secondary | ICD-10-CM | POA: Diagnosis not present

## 2017-07-04 NOTE — Patient Instructions (Signed)
Will arrange for testing of your phlegm samples  Follow up in 3 months

## 2017-07-04 NOTE — Progress Notes (Signed)
[facility]  Chief Complaint  Patient presents with  . Follow-up    Pt has wheezing for last 2 weeks. Pt has productive cough- green thick mucus, SOB with exertion.     Vital signs: BP 120/80 (BP Location: Left Arm, Cuff Size: Normal)   Pulse (!) 59   Ht 5' (1.524 m)   Wt 265 lb 12.8 oz (120.6 kg)   SpO2 97%   BMI 51.91 kg/m   History of Present Illness: Kathy [name] is a 77 y.o. female with ILD with NSIP pattern.  She continues to have cough with gray sputum.  She is not having fever, chest pain, sinus congestion.  She gets short of breath while walking, but her activity is limited more by her knee pains.  She uses oxygen at night and as needed during the day.  She has a pulse oximeter at home.  She gets stiffness and redness in her lower legs.  This is associated with leg swelling.  This has come and gone for years.  Physical Exam:  General - pleasant Eyes - pupils reactive ENT - no sinus tenderness, no oral exudate, no LAN Cardiac - regular, no murmur Chest - faint b/l crackles, no wheeze Abd - soft, non tender Ext - 1+ non pitting edema Skin - venous stasis changes Neuro - normal strength Psych - normal mood   Assessment/Plan:  ILD with NSIP pattern. - serology negative - will arrange for sputum sample for culture, AFB, and fungal culture - discussed option of bronchoscopy or VATS biopsy for further assessment, but she was very reluctant to consider these at this time - if he culture results are negative, could then consider trial of prednisone  Chronic respiratory failure with hypoxia. - continue 2 liters oxygen with sleep and exertion   Patient Instructions  Will arrange for testing of your phlegm samples  Follow up in 3 months    Chesley Mires, MD [place] 07/04/2017, 2:02 PM Pager:  [phone]  Flow Sheet  Pulmonary tests: HRCT chest 02/22/17 >> atherosclerosis, 3 mm LUL nodule,  patchy air trapping b/l, patchy reticulation and GGO b/l, minimal traction BTX Serology 03/01/17 >> ANA negative, RF < 14, CCP < 16, ANCA negative, SSA/SSB < 0.2 PFT 03/15/17 >> FEV1 1.40 (82%), FEV1% 96, TLC 2.86 (64%), DLCO 70% HP panel 03/15/17 >> negative  Cardiac tests: Echo 03/21/17 >> EF 65 to 70%, grade 1 DD  Events:  Past Medical History: She  has a past medical history of Arthritis, Depression, Dizziness, Dysrhythmia, Hypercholesteremia, Hypertension, Neuropathy, Shortness of breath dyspnea, Sleep apnea, and Umbilical hernia.  Past Surgical History: She  has a past surgical history that includes Tonsillectomy; Cholecystectomy; Abdominal hysterectomy; Hernia repair; Knee arthroscopy (Bilateral); Hammer toe surgery; Brow lift (Bilateral, 07/15/2015); and Ptosis repair (Bilateral, 07/15/2015).  Family History: Her family history includes Congestive Heart Failure in her mother; Parkinson's disease in her father; Stroke in her father.  Social History: She  reports that she quit smoking about 31 years ago. Her smoking use included cigarettes. She has a 29.00 pack-year smoking history. she has never used smokeless tobacco. She reports that she does not drink alcohol or use drugs.  Medications: Allergies as of 07/04/2017      Reactions   Lipitor [atorvastatin] Other (See Comments)   Memory issues   Requip [ropinirole Hcl] Other (See Comments)   Pt reports feeling generally unwell on this medication      Medication List  Accurate as of 07/04/17  2:02 PM. Always use your most recent med list.          albuterol 108 (90 Base) MCG/ACT inhaler Commonly known as:  PROVENTIL HFA;VENTOLIN HFA Inhale 1-2 puffs into the lungs every 6 (six) hours as needed for wheezing or shortness of breath.   benzonatate 100 MG capsule Commonly known as:  TESSALON Take 1 capsule (100 mg total) by mouth every 8 (eight) hours.   etodolac 300 MG capsule Commonly known as:  LODINE Take 300 mg by  mouth 2 (two) times daily.   fenofibrate micronized 134 MG capsule Commonly known as:  LOFIBRA Take 134 mg by mouth daily before breakfast.   furosemide 80 MG tablet Commonly known as:  LASIX Take 80 mg by mouth every morning.   losartan 100 MG tablet Commonly known as:  COZAAR Take 100 mg by mouth daily.   meloxicam 15 MG tablet Commonly known as:  MOBIC Take 15 mg by mouth daily.   metoprolol succinate 50 MG 24 hr tablet Commonly known as:  TOPROL-XL Take 50 mg by mouth daily.   venlafaxine XR 150 MG 24 hr capsule Commonly known as:  EFFEXOR-XR Take 150 mg by mouth daily.

## 2017-07-07 ENCOUNTER — Other Ambulatory Visit: Payer: Medicare Other

## 2017-07-07 DIAGNOSIS — J849 Interstitial pulmonary disease, unspecified: Secondary | ICD-10-CM

## 2017-07-08 ENCOUNTER — Other Ambulatory Visit: Payer: Medicare Other

## 2017-07-10 LAB — RESPIRATORY CULTURE OR RESPIRATORY AND SPUTUM CULTURE
MICRO NUMBER:: 90040611
RESULT:: NORMAL
SPECIMEN QUALITY:: ADEQUATE

## 2017-07-11 ENCOUNTER — Other Ambulatory Visit: Payer: Medicare Other

## 2017-07-14 ENCOUNTER — Telehealth: Payer: Self-pay | Admitting: Pulmonary Disease

## 2017-07-14 DIAGNOSIS — J479 Bronchiectasis, uncomplicated: Secondary | ICD-10-CM

## 2017-07-14 NOTE — Telephone Encounter (Signed)
Sputum fungal culture 07/07/17 >> Aspergillus [place]   Results d/w pt.  Will send serum IgE, IgG to Aspergillus, CBC with differential to assess for ABPA.   Will route to my nurse to f/u with patient to ensure lab tests get done.

## 2017-07-15 NOTE — Telephone Encounter (Signed)
It appears labs were ordered 07/14/17.  Patient is aware and voiced understanding.

## 2017-07-20 ENCOUNTER — Other Ambulatory Visit (INDEPENDENT_AMBULATORY_CARE_PROVIDER_SITE_OTHER): Payer: Medicare Other

## 2017-07-20 DIAGNOSIS — J479 Bronchiectasis, uncomplicated: Secondary | ICD-10-CM | POA: Diagnosis not present

## 2017-07-20 LAB — CBC WITH DIFFERENTIAL/PLATELET
Basophils Absolute: 0.1 10*3/uL (ref 0.0–0.1)
Basophils Relative: 1.2 % (ref 0.0–3.0)
EOS ABS: 0.2 10*3/uL (ref 0.0–0.7)
EOS PCT: 1.8 % (ref 0.0–5.0)
HCT: 37.7 % (ref 36.0–46.0)
Hemoglobin: 12.7 g/dL (ref 12.0–15.0)
LYMPHS ABS: 3.4 10*3/uL (ref 0.7–4.0)
Lymphocytes Relative: 33.3 % (ref 12.0–46.0)
MCHC: 33.8 g/dL (ref 30.0–36.0)
MCV: 94.5 fl (ref 78.0–100.0)
MONO ABS: 0.7 10*3/uL (ref 0.1–1.0)
Monocytes Relative: 6.9 % (ref 3.0–12.0)
NEUTROS PCT: 56.8 % (ref 43.0–77.0)
Neutro Abs: 5.8 10*3/uL (ref 1.4–7.7)
Platelets: 283 10*3/uL (ref 150.0–400.0)
RBC: 3.99 Mil/uL (ref 3.87–5.11)
RDW: 13.2 % (ref 11.5–15.5)
WBC: 10.2 10*3/uL (ref 4.0–10.5)

## 2017-07-21 LAB — IGE: IGE (IMMUNOGLOBULIN E), SERUM: 44 kU/L (ref <=114)

## 2017-07-26 LAB — ASPERGILLUS IGE PANEL
A. AMSTEL/GLAUCU CLASS INTERP: 0
A. FLAVUS CLASS INTERP: 0
A. FUMIGATUS CLASS INTERP: 0
A. NIGER CLASS INTERP: 0
A. Nidulans Class Interp: 0
A. TERREUS CLASS INTERP: 0
A. Versicolor Class Interp: 0
Aspergillus amstel/glaucu IgE*: <0.35 kU/L (ref <0.35)
Aspergillus fumigatus IgE: <0.1 kU/L (ref <0.35)
Aspergillus nidulans IgE: <0.35 kU/L (ref <0.35)
Aspergillus niger IgE: <0.1 kU/L (ref <0.35)

## 2017-07-26 LAB — ASPERGILLUS NIGER ANTIBODIES: Aspergillus Niger Antibodies: NEGATIVE

## 2017-08-05 LAB — FUNGUS CULTURE W SMEAR
MICRO NUMBER:: 90040608
SMEAR:: NONE SEEN
SPECIMEN QUALITY:: ADEQUATE

## 2017-08-12 ENCOUNTER — Telehealth: Payer: Self-pay | Admitting: Pulmonary Disease

## 2017-08-12 NOTE — Telephone Encounter (Signed)
Aspergillus IgE Panel 07/20/17 >> negative IgE 07/20/17 >> 44   Please let her know her lab tests were normal.

## 2017-08-12 NOTE — Telephone Encounter (Signed)
Spoke with Patient about lab results.  Patient stated understanding.

## 2017-08-24 LAB — MYCOBACTERIA,CULT W/FLUOROCHROME SMEAR
MICRO NUMBER: 90040612
SMEAR: NONE SEEN
SPECIMEN QUALITY:: ADEQUATE

## 2017-09-20 DIAGNOSIS — E7800 Pure hypercholesterolemia, unspecified: Secondary | ICD-10-CM | POA: Diagnosis not present

## 2017-09-20 DIAGNOSIS — M1710 Unilateral primary osteoarthritis, unspecified knee: Secondary | ICD-10-CM | POA: Diagnosis not present

## 2017-09-20 DIAGNOSIS — I10 Essential (primary) hypertension: Secondary | ICD-10-CM | POA: Diagnosis not present

## 2017-09-20 DIAGNOSIS — R7303 Prediabetes: Secondary | ICD-10-CM | POA: Diagnosis not present

## 2017-09-20 DIAGNOSIS — Z6841 Body Mass Index (BMI) 40.0 and over, adult: Secondary | ICD-10-CM | POA: Diagnosis not present

## 2017-09-20 DIAGNOSIS — Z9181 History of falling: Secondary | ICD-10-CM | POA: Diagnosis not present

## 2017-09-21 ENCOUNTER — Ambulatory Visit (INDEPENDENT_AMBULATORY_CARE_PROVIDER_SITE_OTHER): Payer: Medicare Other | Admitting: Pulmonary Disease

## 2017-09-21 ENCOUNTER — Encounter: Payer: Self-pay | Admitting: Pulmonary Disease

## 2017-09-21 VITALS — BP 128/70 | HR 60 | Ht 60.0 in | Wt 260.2 lb

## 2017-09-21 DIAGNOSIS — J9611 Chronic respiratory failure with hypoxia: Secondary | ICD-10-CM

## 2017-09-21 DIAGNOSIS — J849 Interstitial pulmonary disease, unspecified: Secondary | ICD-10-CM | POA: Diagnosis not present

## 2017-09-21 DIAGNOSIS — J479 Bronchiectasis, uncomplicated: Secondary | ICD-10-CM | POA: Diagnosis not present

## 2017-09-21 MED ORDER — PREDNISONE 20 MG PO TABS: 30.0000 mg | ORAL_TABLET | Freq: Every day | ORAL | 1 refills | Status: DC

## 2017-09-21 NOTE — Progress Notes (Signed)
[facility]  Chief Complaint  Patient presents with  . Follow-up    still increased SOB even with short distances. occ wheezing mostly in the morning, no chest tightness slight cough on home O2 at home during the day when resting to catch breath (2L) doesn't wear at night    Vital signs: BP 128/70 (BP Location: Left Arm, Cuff Size: Normal)   Pulse 60   Ht 5' (1.524 m)   Wt 260 lb 3.2 oz (118 kg)   SpO2 90%   BMI 50.82 kg/m   History of Present Illness: Kathy [name] is a 77 y.o. female with ILD with NSIP pattern.  She continues to have trouble with her breathing.  She coughs intermittently and gets wheezing at times.  She will bring up clear sputum, but sometimes has green sputum.  She denies fever, hemoptysis, chest pain, skin rash, sore throat, sinus congestion, or joint swelling.   Physical Exam:  General - pleasant Eyes - pupils reactive ENT - no sinus tenderness, no oral exudate, no LAN Cardiac - regular, no murmur Chest - faint basilar crackles Abd - soft, non tender Ext - 1+ non pitting edema Skin - no rashes Neuro - normal strength Psych - normal mood   Assessment/Plan:  ILD with NSIP pattern. - serology negative - she had Aspergillus in sputum, but this is of uncertain significance - she doesn't have evidence to suggest infectious process otherwise - she does not want to undergo bronchoscopy or VATS bx - will give trial of prednisone - based on response will determine when she needs f/u CT chest  Chronic respiratory failure with hypoxia. - 2 liters oxygen with sleep and exertion   Patient Instructions  Prednisone 30 mg daily  Follow up in 3 weeks with Dr. Halford Chessman or Nurse Practitioner  Time spent 26 minutes  Chesley Mires, MD [place] 09/21/2017, 4:51 PM Pager:  [phone]  Flow Sheet  Pulmonary tests: HRCT chest 02/22/17 >> atherosclerosis, 3 mm LUL nodule, patchy air trapping b/l,  patchy reticulation and GGO b/l, minimal traction BTX Serology 03/01/17 >> ANA negative, RF < 14, CCP < 16, ANCA negative, SSA/SSB < 0.2 PFT 03/15/17 >> FEV1 1.40 (82%), FEV1% 96, TLC 2.86 (64%), DLCO 70% HP panel 03/15/17 >> negative Aspergillus IgE Panel 07/20/17 >> negative IgE 07/20/17 >> 44  Cardiac tests: Echo 03/21/17 >> EF 65 to 70%, grade 1 DD  Past Medical History: She  has a past medical history of Arthritis, Depression, Dizziness, Dysrhythmia, Hypercholesteremia, Hypertension, Neuropathy, Shortness of breath dyspnea, Sleep apnea, and Umbilical hernia.  Past Surgical History: She  has a past surgical history that includes Tonsillectomy; Cholecystectomy; Abdominal hysterectomy; Hernia repair; Knee arthroscopy (Bilateral); Hammer toe surgery; Brow lift (Bilateral, 07/15/2015); and Ptosis repair (Bilateral, 07/15/2015).  Family History: Her family history includes Congestive Heart Failure in her mother; Parkinson's disease in her father; Stroke in her father.  Social History: She  reports that she quit smoking about 31 years ago. Her smoking use included cigarettes. She has a 29.00 pack-year smoking history. She has never used smokeless tobacco. She reports that she does not drink alcohol or use drugs.  Medications: Allergies as of 09/21/2017      Reactions   Lipitor [atorvastatin] Other (See Comments)   Memory issues   Requip [ropinirole Hcl] Other (See Comments)   Pt reports feeling generally unwell on this medication      Medication List        Accurate  as of 09/21/17  4:51 PM. Always use your most recent med list.          albuterol 108 (90 Base) MCG/ACT inhaler Commonly known as:  PROVENTIL HFA;VENTOLIN HFA Inhale 1-2 puffs into the lungs every 6 (six) hours as needed for wheezing or shortness of breath.   benzonatate 100 MG capsule Commonly known as:  TESSALON Take 1 capsule (100 mg total) by mouth every 8 (eight) hours.   etodolac 300 MG capsule Commonly known as:   LODINE Take 300 mg by mouth 2 (two) times daily.   fenofibrate micronized 134 MG capsule Commonly known as:  LOFIBRA Take 134 mg by mouth daily before breakfast.   furosemide 80 MG tablet Commonly known as:  LASIX Take 80 mg by mouth every morning.   losartan 100 MG tablet Commonly known as:  COZAAR Take 100 mg by mouth daily.   meloxicam 15 MG tablet Commonly known as:  MOBIC Take 15 mg by mouth daily.   metoprolol succinate 50 MG 24 hr tablet Commonly known as:  TOPROL-XL Take 50 mg by mouth daily.   predniSONE 20 MG tablet Commonly known as:  DELTASONE Take 1.5 tablets (30 mg total) by mouth daily with breakfast.   venlafaxine XR 150 MG 24 hr capsule Commonly known as:  EFFEXOR-XR Take 150 mg by mouth daily.

## 2017-09-21 NOTE — Patient Instructions (Signed)
Prednisone 30 mg daily  Follow up in 3 weeks with Dr. Halford Chessman or Nurse Practitioner

## 2017-10-12 ENCOUNTER — Ambulatory Visit: Payer: Medicare Other | Admitting: Adult Health

## 2017-10-18 DIAGNOSIS — N183 Chronic kidney disease, stage 3 (moderate): Secondary | ICD-10-CM | POA: Diagnosis not present

## 2017-10-25 ENCOUNTER — Encounter: Payer: Self-pay | Admitting: Adult Health

## 2017-10-25 ENCOUNTER — Other Ambulatory Visit: Payer: Self-pay

## 2017-10-25 ENCOUNTER — Ambulatory Visit (INDEPENDENT_AMBULATORY_CARE_PROVIDER_SITE_OTHER): Payer: Medicare Other | Admitting: Adult Health

## 2017-10-25 DIAGNOSIS — J849 Interstitial pulmonary disease, unspecified: Secondary | ICD-10-CM | POA: Diagnosis not present

## 2017-10-25 DIAGNOSIS — J9611 Chronic respiratory failure with hypoxia: Secondary | ICD-10-CM | POA: Diagnosis not present

## 2017-10-25 MED ORDER — PREDNISONE 10 MG PO TABS: ORAL_TABLET | ORAL | 0 refills | Status: DC

## 2017-10-25 NOTE — Patient Instructions (Addendum)
Decrease Prednisone 20mg  daily for 1 week then 10mg  daily for 1 week and 5mg  daily for 1 week and stop .  Wear oxygen with activity and At bedtime   Goal is for oxygen level >90%  Refer to Pulmonary Rehab  Order for POC .  Follow up with Dr. Halford Chessman  In 3-4 months  With PFT  and As needed   Please contact office for sooner follow up if symptoms do not improve or worsen or seek emergency care

## 2017-10-25 NOTE — Assessment & Plan Note (Signed)
ILD with NSIP pattern on CT chest   No significant clinical improvement on steroids over the last 3 to 4 weeks.  Will begin a slow taper to off. She has declined Ofev and Esbriet.   She is agreeable to pulmonary rehab.  Plan  Patient Instructions  Decrease Prednisone 20mg  daily for 1 week then 10mg  daily for 1 week and 5mg  daily for 1 week and stop .  Wear oxygen with activity and At bedtime   Goal is for oxygen level >90%  Refer to Pulmonary Rehab  Order for POC .  Follow up with Dr. Halford Chessman  In 3-4 months  With PFT  and As needed   Please contact office for sooner follow up if symptoms do not improve or worsen or seek emergency care

## 2017-10-25 NOTE — Assessment & Plan Note (Signed)
Cont on O2  POC order

## 2017-10-25 NOTE — Progress Notes (Signed)
@Patient  ID: Kathy [name], female    DOB: Feb 20, 1941, 76 y.o.   MRN: [id number]  Chief Complaint  Patient presents with  . Follow-up    Referring provider: Fae Pippin  HPI: 77 yo female former smoker ( 1988) seen for pulmonary consult 02/15/17 for dyspnea and hypoxia  Found to have ILD on CT chest .  O2 RF on O2 at 2l/m w/ act and At bedtime   Has OSA -CPAP intolerant .    TEST  ILD workup :  03/01/17 >> ANA negative, RF < 14, CCP < 16, ANCA negative, SSA/SSB < 0.83m HSP neg  HRCT showed 3 mm left upper lobe nodule, patchy air trapping bilaterally, patchy reticulation and groundglass opacities bilaterally with minimum traction bronchiectasis. This was concerning for ILD with a possible chronic hypersensitivity pneumonitis component. PFT was done today that showed moderate restriction with no airflow obstruction. FEV1 was 82%, ratio 96, FVC 63%, DLCO 70%, no significant bronchodilator response. Total lung capacity 64% Worked at [employer] for 6 yr . No Amiodarone , macrobid , or MTX use/exposure.  Second hand smoke from husband.  Echo EF 65-70%, Grade 1 DD .  Aspergillus IgE Panel 07/20/17 >> negative IgE 07/20/17 >> 44 Cardiac tests: Echo 03/21/17 >> EF 65 to 70%, grade 1 DD  10/25/2017 Follow up: ILD , O2 RF  Patient presents for a one-month follow-up.  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  Interstitial lung disease work-up has showed a negative autoimmune and connective tissue disease work-up.  She has declined bronchoscopy or VATS biopsy and is not interested in anti-fibrotic's with OFEV or Esbriet .  Patient does get short of breath with walking.  Feels that this has been slowly getting worse over the last year.  She denies any shortness of breath at rest.  She is on oxygen 2 L with activity and at bedtime.  Would like an order for a portable oxygen device.. Last visit patient was started on an empiric trial of steroids with prednisone 30  mg.  Patient says she has not seen any change in her breathing does feel that it has helped her knee pain. She denies any increased cough.  No change in activity tolerance.  Allergies  Allergen Reactions  . Lipitor [Atorvastatin] Other (See Comments)    Memory issues  . Requip [Ropinirole Hcl] Other (See Comments)    Pt reports feeling generally unwell on this medication    Immunization History  Administered Date(s) Administered  . Influenza, High Dose Seasonal PF 04/04/2017  . Pneumococcal Polysaccharide-23 06/30/2016    Past Medical History:  Diagnosis Date  . Arthritis    fingers  . Depression   . Dizziness    in AM, getting out of bed  . Dysrhythmia    "skips a beat" sometimes - followed by PCP  . Hypercholesteremia   . Hypertension   . Neuropathy    bilateral feet  . Shortness of breath dyspnea   . Sleep apnea    has CPAP, doesn't use  . Umbilical hernia     Tobacco History: Social History   Tobacco Use  Smoking Status Former Smoker  . Packs/day: 1.00  . Years: 29.00  . Pack years: 29.00  . Types: Cigarettes  . Last attempt to quit: 06/28/1986  . Years since quitting: 31.3  Smokeless Tobacco Never Used  Tobacco Comment   quit 40+ yrs ago, 1 PPD for a few years  Counseling given: Not Answered Comment: quit 40+ yrs ago, 1 PPD for a few years   Outpatient Encounter Medications as of 10/25/2017  Medication Sig  . albuterol (PROVENTIL HFA;VENTOLIN HFA) 108 (90 Base) MCG/ACT inhaler Inhale 1-2 puffs into the lungs every 6 (six) hours as needed for wheezing or shortness of breath.  . benzonatate (TESSALON) 100 MG capsule Take 1 capsule (100 mg total) by mouth every 8 (eight) hours.  Marland Kitchen etodolac (LODINE) 300 MG capsule Take 300 mg by mouth 2 (two) times daily.  . fenofibrate micronized (LOFIBRA) 134 MG capsule Take 134 mg by mouth daily before breakfast.  . furosemide (LASIX) 80 MG tablet Take 80 mg by mouth every morning.  Marland Kitchen losartan (COZAAR) 100 MG tablet  Take 100 mg by mouth daily.  . meloxicam (MOBIC) 15 MG tablet Take 15 mg by mouth daily.   . metoprolol succinate (TOPROL-XL) 50 MG 24 hr tablet Take 50 mg by mouth daily.  . predniSONE (DELTASONE) 20 MG tablet Take 1.5 tablets (30 mg total) by mouth daily with breakfast.  . venlafaxine XR (EFFEXOR-XR) 150 MG 24 hr capsule Take 150 mg by mouth daily.  . predniSONE (DELTASONE) 10 MG tablet 2 tabs daily for 1 week , 1 tab daily for 1 week , and 1/2 daily for 1 week and stop   No facility-administered encounter medications on file as of 10/25/2017.      Review of Systems  Constitutional:   No  weight loss, night sweats,  Fevers, chills,  +fatigue, or  lassitude.  HEENT:   No headaches,  Difficulty swallowing,  Tooth/dental problems, or  Sore throat,                No sneezing, itching, ear ache, nasal congestion, post nasal drip,   CV:  No chest pain,  Orthopnea, PND, swelling in lower extremities, anasarca, dizziness, palpitations, syncope.   GI  No heartburn, indigestion, abdominal pain, nausea, vomiting, diarrhea, change in bowel habits, loss of appetite, bloody stools.   Resp:    No excess mucus, no productive cough,  No non-productive cough,  No coughing up of blood.  No change in color of mucus.  No wheezing.  No chest wall deformity  Skin: no rash or lesions.  GU: no dysuria, change in color of urine, no urgency or frequency.  No flank pain, no hematuria   MS:  No joint pain or swelling.  No decreased range of motion.  No back pain.    Physical Exam  BP 118/82 (BP Location: Left Arm, Cuff Size: Normal)   Pulse 88   Ht 5\' 1"  (1.549 m)   Wt 253 lb (114.8 kg)   SpO2 92%   BMI 47.80 kg/m   GEN: A/Ox3; pleasant , NAD,obese    HEENT:  [place]/AT,  EACs-clear, TMs-wnl, NOSE-clear, THROAT-clear, no lesions, no postnasal drip or exudate noted.   NECK:  Supple w/ fair ROM; no JVD; normal carotid impulses w/o bruits; no thyromegaly or nodules palpated; no lymphadenopathy.    RESP   BB crackles  no accessory muscle use, no dullness to percussion  CARD:  RRR, no m/r/g, no peripheral edema, pulses intact, no cyanosis or clubbing.  GI:   Soft & nt; nml bowel sounds; no organomegaly or masses detected.   Musco: Warm bil, no deformities or joint swelling noted.   Neuro: alert, no focal deficits noted.    Skin: Warm, no lesions or rashes    Lab Results:  CBC  BMET  BNP  ProBNP No results found for: PROBNP  Imaging: No results found.   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  Will begin a slow taper to off. She has declined Ofev and Esbriet.   She is agreeable to pulmonary rehab.  Plan  Patient Instructions  Decrease Prednisone 20mg  daily for 1 week then 10mg  daily for 1 week and 5mg  daily for 1 week and stop .  Wear oxygen with activity and At bedtime   Goal is for oxygen level >90%  Refer to Pulmonary Rehab  Order for POC .  Follow up with Dr. Halford Chessman  In 3-4 months  With PFT  and As needed   Please contact office for sooner follow up if symptoms do not improve or worsen or seek emergency care       Chronic respiratory failure with hypoxia ([place]) Cont on O2  POC order      Rexene Edison, NP 10/25/2017

## 2017-10-26 NOTE — Progress Notes (Signed)
Reviewed and agree with assessment/plan.   Rhylen Shaheen, MD [facility] 06/23/2016, 12:24 PM Pager:  [phone]  

## 2017-10-27 NOTE — Addendum Note (Signed)
Addended by: Parke Poisson E on: 10/27/2017 03:04 PM   Modules accepted: Orders

## 2017-10-27 NOTE — Addendum Note (Signed)
Addended by: Parke Poisson E on: 10/27/2017 12:31 PM   Modules accepted: Orders

## 2017-10-27 NOTE — Addendum Note (Signed)
Addended by: Parke Poisson E on: 10/27/2017 10:54 AM   Modules accepted: Orders

## 2017-11-07 ENCOUNTER — Ambulatory Visit (INDEPENDENT_AMBULATORY_CARE_PROVIDER_SITE_OTHER)
Admission: RE | Admit: 2017-11-07 | Discharge: 2017-11-07 | Disposition: A | Discharge status: Home / Self Care | Payer: Medicare Other | Source: Ambulatory Visit | Attending: Internal Medicine | Admitting: Internal Medicine

## 2017-11-07 ENCOUNTER — Ambulatory Visit (INDEPENDENT_AMBULATORY_CARE_PROVIDER_SITE_OTHER): Payer: Medicare Other | Admitting: Internal Medicine

## 2017-11-07 ENCOUNTER — Encounter: Payer: Self-pay | Admitting: Internal Medicine

## 2017-11-07 VITALS — BP 132/88 | HR 101 | Temp 98.0°F | Ht 61.0 in | Wt 266.0 lb

## 2017-11-07 DIAGNOSIS — R05 Cough: Secondary | ICD-10-CM | POA: Diagnosis not present

## 2017-11-07 DIAGNOSIS — J9611 Chronic respiratory failure with hypoxia: Secondary | ICD-10-CM | POA: Diagnosis not present

## 2017-11-07 DIAGNOSIS — R0989 Other specified symptoms and signs involving the circulatory and respiratory systems: Secondary | ICD-10-CM | POA: Diagnosis not present

## 2017-11-07 DIAGNOSIS — R0609 Other forms of dyspnea: Secondary | ICD-10-CM

## 2017-11-07 DIAGNOSIS — J849 Interstitial pulmonary disease, unspecified: Secondary | ICD-10-CM

## 2017-11-07 DIAGNOSIS — R059 Cough, unspecified: Secondary | ICD-10-CM | POA: Insufficient documentation

## 2017-11-07 MED ORDER — AZITHROMYCIN 250 MG PO TABS: ORAL_TABLET | ORAL | 0 refills | Status: DC

## 2017-11-07 NOTE — Progress Notes (Signed)
HPI: 77 yo female former smoker ( 1988) seen for pulmonary consult 02/15/17 for dyspnea and hypoxia  Found to have ILD on CT chest .  O2 RF on O2 at 2l/m w/ act and At bedtime   Has OSA -CPAP intolerant .    TESTS ILD workup :  03/01/17 >> ANA negative, RF < 14, CCP < 16, ANCA negative, SSA/SSB < 0.34m HSP neg  HRCT showed 3 mm left upper lobe nodule, patchy air trapping bilaterally, patchy reticulation and groundglass opacities bilaterally with minimum traction bronchiectasis. This was concerning for ILD with a possible chronic hypersensitivity pneumonitis component. PFT was done today that showed moderate restriction with no airflow obstruction. FEV1 was 82%, ratio 96, FVC 63%, DLCO 70%, no significant bronchodilator response. Total lung capacity 64% Worked at [employer] for 6 yr . No Amiodarone , macrobid , or MTX use/exposure.  Second hand smoke from husband.  Echo EF 65-70%, Grade 1 DD .  Aspergillus IgE Panel 07/20/17 >> negative IgE 07/20/17 >> 44 Cardiac tests: Echo 03/21/17 >> EF 65 to 70%, grade 1 DD  10/25/2017 Follow up: ILD , O2 RF  Patient presents for a one-month follow-up.  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  Interstitial lung disease work-up has showed a negative autoimmune and connective tissue disease work-up.  She has declined bronchoscopy or VATS biopsy and is not interested in anti-fibrotic's with OFEV or Esbriet .  Patient does get short of breath with walking.  Feels that this has been slowly getting worse over the last year.  She denies any shortness of breath at rest.  She is on oxygen 2 L with activity and at bedtime.  Would like an order for a portable oxygen device.. Last visit patient was started on an empiric trial of steroids with prednisone 30 mg.  Patient says she has not seen any change in her breathing does feel that it has helped her knee pain. She denies any increased cough.  No change in activity  tolerance rec   Decrease Prednisone 20mg  daily for 1 week then 10mg  daily for 1 week and 5mg  daily for 1 week and stop .  Wear oxygen with activity and At bedtime   Goal is for oxygen level >90%  Refer to Pulmonary Rehab  Order for POC .  Follow up with Dr. Halford Chessman  In 3-4 months  With PFT  and As needed      11/07/2017 acute extended ov/Sandip Power re:  Chief Complaint  Patient presents with  . Acute Visit    Increased SOB, chills and sweats for the past 2-3 days. She has been coughing more and producing some minimal light, grey to green sputum.    NSIP/ tapered prednisone 10 mg daily  For about a week prior to worse sob assoc with prod cough as above day > noct Started to feel worse x 3 days with doe one room to next not much better on 02 / no better with saba Wearing 0 2 18/24  02 2lpm hs 1big pillow  No obvious day to day or daytime variability or assoc excess/ purulent sputum or mucus plugs or hemoptysis or cp or chest tightness, subjective wheeze or overt sinus or hb symptoms. No unusual exposure hx or h/o childhood pna/ asthma or knowledge of premature birth.  Sleeping  2lpm hs on 1 big pillow   without nocturnal  or early am exacerbation  of respiratory  c/o's or need for noct saba. Also denies any obvious fluctuation of symptoms with weather or environmental changes or other aggravating or alleviating factors except as outlined above   Current Allergies, Complete Past Medical History, Past Surgical History, Family History, and Social History were reviewed in [employer] record.  ROS  The following are not active complaints unless bolded Hoarseness, sore throat, dysphagia, dental problems, itching, sneezing,  nasal congestion or discharge of excess mucus or purulent secretions, ear ache,   fever, chills, sweats, unintended wt loss or wt gain, classically pleuritic or exertional cp,  orthopnea pnd or arm/hand swelling  or leg swelling better x one week presyncope,  palpitations, abdominal pain, anorexia, nausea, vomiting, diarrhea  or change in bowel habits or change in bladder habits, change in stools or change in urine, dysuria, hematuria,  rash, arthralgias, visual complaints, headache, numbness, weakness or ataxia or problems with walking or coordination,  change in mood or  memory.        Current Meds  Medication Sig  . albuterol (PROVENTIL HFA;VENTOLIN HFA) 108 (90 Base) MCG/ACT inhaler Inhale 1-2 puffs into the lungs every 6 (six) hours as needed for wheezing or shortness of breath.  . benzonatate (TESSALON) 100 MG capsule Take 1 capsule (100 mg total) by mouth every 8 (eight) hours.  Marland Kitchen etodolac (LODINE) 300 MG capsule Take 300 mg by mouth 2 (two) times daily.  . fenofibrate micronized (LOFIBRA) 134 MG capsule Take 134 mg by mouth daily before breakfast.  . furosemide (LASIX) 80 MG tablet Take 80 mg by mouth every morning.  Marland Kitchen losartan (COZAAR) 100 MG tablet Take 100 mg by mouth daily.  . meloxicam (MOBIC) 15 MG tablet Take 15 mg by mouth daily.   . metoprolol succinate (TOPROL-XL) 50 MG 24 hr tablet Take 50 mg by mouth daily.  . predniSONE (DELTASONE) 10 MG tablet 2 tabs daily for 1 week , 1 tab daily for 1 week , and 1/2 daily for 1 week and stop  . venlafaxine XR (EFFEXOR-XR) 150 MG 24 hr capsule Take 150 mg by mouth daily.                Physical Exam  Obese amb wf nad but can't get on exam table    Wt Readings from Last 3 Encounters:  11/07/17 266 lb (120.7 kg)  10/25/17 253 lb (114.8 kg)  09/21/17 260 lb 3.2 oz (118 kg)     Vital signs reviewed - Note on arrival 02 sats  95% on RA     HEENT: nl dentition, turbinates bilaterally, and oropharynx. Nl external ear canals without cough reflex   NECK :  without JVD/Nodes/TM/ nl carotid upstrokes bilaterally   LUNGS: no acc muscle use,  Nl contour chest with minimal insp/exp rhonchi bilaterally  without cough on insp or exp maneuvers   CV:  RRR  no s3 or murmur or increase in  P2, and 2+ symp pitting edema both LEs  ABD:  Quite obese nontender with poor inspiratory excursion  . No bruits or organomegaly appreciated, bowel sounds nl  MS:  Slow gait/ ext warm without deformities, calf tenderness, cyanosis or clubbing No obvious joint restrictions   SKIN: warm and dry without lesions    NEURO:  alert, approp, nl sensorium with  no motor or cerebellar deficits apparent.          CXR PA and Lateral:   11/07/2017 :    I personally reviewed images and  agree with radiology impression as follows: 1.  No active cardiopulmonary disease. 2. Mildly coarsened interstitial markings bilaterally, similar to prior study, likely reflecting underlying chronic interstitial lung disease as seen on prior chest CT.      Assessment & Plan:

## 2017-11-07 NOTE — Patient Instructions (Addendum)
Try prilosec otc 20mg   Take 30-60 min before first meal of the day and Pepcid ac (famotidine) 20 mg one @  bedtime until cough is completely gone for at least a week without the need for cough suppression  GERD (REFLUX)  is an extremely common cause of respiratory symptoms just like yours , many times with no obvious heartburn at all.    It can be treated with medication, but also with lifestyle changes including elevation of the head of your bed (ideally with 6 inch  bed blocks),  Smoking cessation, avoidance of late meals, excessive alcohol, and avoid fatty foods, chocolate, peppermint, colas, red wine, and acidic juices such as orange juice.  NO MINT OR MENTHOL PRODUCTS SO NO COUGH DROPS  USE SUGARLESS CANDY INSTEAD (Jolley ranchers or Stover's or Life Savers) or even ice chips will also do - the key is to swallow to prevent all throat clearing. NO OIL BASED VITAMINS - use powdered substitutes.    Resume prednsione 20 mg daily for now and take a zpak   Take an extra lasix today only until we have a chance to check your labs   We will check your labs from [facility]    Please remember to go to the  x-ray department downstairs in the basement  for your tests - we will call you with the results when they are available.      Return in one week to [facility] NP

## 2017-11-08 NOTE — Progress Notes (Signed)
LMTCB

## 2017-11-09 ENCOUNTER — Telehealth: Payer: Self-pay | Admitting: Pulmonary Disease

## 2017-11-09 ENCOUNTER — Encounter: Payer: Self-pay | Admitting: Internal Medicine

## 2017-11-09 DIAGNOSIS — J849 Interstitial pulmonary disease, unspecified: Secondary | ICD-10-CM

## 2017-11-09 NOTE — Assessment & Plan Note (Addendum)
No evidence of pna/ chf on cxr so likely either PF flare on lower doses pred or URI > see sep a/p

## 2017-11-09 NOTE — Assessment & Plan Note (Signed)
sats ok on ra, uses 2lpm hs and ok to titrate daytime to keep > 90% but note sats ok today on arrival

## 2017-11-09 NOTE — Assessment & Plan Note (Signed)
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  For now therefore I recommend the patient maintain  20 mg per day until returns   I had an extended discussion with the patient reviewing all relevant studies completed to date and  lasting 15 to 20 minutes of a 25 minute acute office  visit with pt new to me    Each maintenance medication was reviewed in detail including most importantly the difference between maintenance and prns and under what circumstances the prns are to be triggered using an action plan format that is not reflected in the computer generated alphabetically organized AVS.    Please see AVS for specific instructions unique to this visit that I personally wrote and verbalized to the the pt in detail and then reviewed with pt  by my nurse highlighting any  changes in therapy recommended at today's visit to their plan of care.

## 2017-11-09 NOTE — Telephone Encounter (Signed)
Order for pulm rehab was signed under TP and not a MD.. New order placed and signed under VS (pt's usual MD) Spoke with Jarrett Soho at [facility] rehab to make aware.  Nothing further needed at this time.

## 2017-11-09 NOTE — Assessment & Plan Note (Signed)
Acute worsening as pred tapered in pt with ILD ? Etiology   Use of PPI is associated with improved survival time and with decreased radiologic fibrosis per King's study published in AJRCCM vol 184 p1390.  Dec 2011 and also may have other beneficial effects as per the latest review in [place] vol 193 W4665 Jun 20016.  This may not always be cause and effect, but given how universally unimpressive and expensive  all the other  Drugs developed to day  have been for pf,   rec start  rx ppi / diet/ lifestyle modification and f/u with serial walking sats and lung volumes for now to put more points on the curve / establish firm baseline before considering additional measures.   Also rx zpak since mucus is discolored

## 2017-11-11 ENCOUNTER — Other Ambulatory Visit: Payer: Self-pay

## 2017-11-11 MED ORDER — PREDNISONE 20 MG PO TABS: 20.0000 mg | ORAL_TABLET | Freq: Every day | ORAL | 0 refills | Status: DC

## 2017-11-17 ENCOUNTER — Ambulatory Visit (INDEPENDENT_AMBULATORY_CARE_PROVIDER_SITE_OTHER): Payer: Medicare Other | Admitting: Pulmonary Disease

## 2017-11-17 ENCOUNTER — Encounter: Payer: Self-pay | Admitting: Pulmonary Disease

## 2017-11-17 VITALS — BP 122/58 | HR 88 | Ht 59.0 in | Wt 256.0 lb

## 2017-11-17 DIAGNOSIS — R0609 Other forms of dyspnea: Secondary | ICD-10-CM

## 2017-11-17 DIAGNOSIS — J9611 Chronic respiratory failure with hypoxia: Secondary | ICD-10-CM

## 2017-11-17 DIAGNOSIS — J849 Interstitial pulmonary disease, unspecified: Secondary | ICD-10-CM | POA: Diagnosis not present

## 2017-11-17 MED ORDER — PREDNISONE 20 MG PO TABS: 20.0000 mg | ORAL_TABLET | Freq: Every day | ORAL | 0 refills | Status: DC

## 2017-11-17 MED ORDER — ALBUTEROL SULFATE HFA 108 (90 BASE) MCG/ACT IN AERS: 1.0000 | INHALATION_SPRAY | Freq: Four times a day (QID) | RESPIRATORY_TRACT | 2 refills | Status: DC | PRN

## 2017-11-17 NOTE — Assessment & Plan Note (Signed)
Refilled albuterol today  Continue prednisone 20 mg daily >>>refilled  Referral to DME  >>>to address POC battery and follow up with patient  Referall for Primary Care  >>> She wants to establish 1st floor Primary care at [place]  >>> Placed back in April should hear something by June call office if you do not hear anything by mid June.

## 2017-11-17 NOTE — Patient Instructions (Addendum)
Refilled albuterol today  Continue prednisone 20 mg daily >>>refilled  Referral to DME  >>>to address POC battery and follow up with patient  Referall for Primary Care  >>> She wants to establish 1st floor Primary care at [place]  >>> Placed back in April should hear something by June call office if you do not hear anything by mid June.  Primary care follow-up >>> Notify primary care the year needing 20 mg of prednisone daily and may need close monitoring of glucose especially since she have a past medical history of being prediabetic >>> Notify primary care of your lower extremity swelling in spite of you taking 80 mg of Lasix daily >>> If you are requiring more diuretic you probably need blood work to monitor your kidney functioning    Please contact the office if your symptoms worsen or you have concerns that you are not improving.   Thank you for choosing [facility] for your healthcare, and for allowing [place] to partner with you on your healthcare journey. I am thankful to be able to provide care to you today.   Wyn Quaker FNP-C

## 2017-11-17 NOTE — Assessment & Plan Note (Signed)
Refilled albuterol today  Continue prednisone 20 mg daily >>>refilled Referral to DME  >>>to address POC battery and follow up with patient Referall for Primary Care  >>> She wants to establish 1st floor Primary care at [place]  >>> Placed back in April should hear something by June call office if you do not hear anything by mid June.

## 2017-11-17 NOTE — Progress Notes (Signed)
@Patient  ID: Kathy [name], female    DOB: 02-01-1941, 77 y.o.   MRN: [id number]  Chief Complaint  Patient presents with  . Follow-up    ILD    Referring provider: Fae Pippin  HPI: 77 year old female, former smoker (quit 1988) with 29 pack years.  Rarely followed for interstitial lung disease. O2 RF on O2 at 2l/m w/ act andAt bedtime Has OSA - CPAP intolerant .  Recent [place] [facility] Encounters:  11/07/2017-acute visit Increased shortness of breath chills for the past 2-3 days.  Resume prednisone 20 mg daily as well as taking Z-Pak. 2 L at night.  ILD workup : 03/01/17 >> ANA negative, RF < 14, CCP < 16, ANCA negative, SSA/SSB < 0.81m HSP neg HRCT showed 3 mm left upper lobe nodule, patchy air trapping bilaterally, patchy reticulation and groundglass opacities bilaterally with minimum traction bronchiectasis. This was concerning for ILD with a possible chronic hypersensitivity pneumonitis component. PFT was done today that showed moderate restriction with no airflow obstruction. FEV1 was 82%, ratio 96, FVC 63%, DLCO 70%, no significant bronchodilator response. Total lung capacity 64% Worked at [employer] for 6 yr . No Amiodarone , macrobid , or MTX use/exposure.  Second hand smoke from husband. Echo EF 65-70%, Grade 1 DD . Aspergillus IgE Panel 07/20/17 >> negative IgE 07/20/17 >> 44  Imaging:  11/07/2017-chest x-ray-no acute cardiopulmonary disease 02/22/2017-CT chest -suggestive of interstitial lung disease, solitary tiny 3 mm solid left upper lobe pulmonary nodule   Cardiac:  Echo 03/21/17 >> EF 65 to 70%, grade 1 DD  11/17/17  OV Patient reports today for follow-up visit from last week appointment.  Patient feels about "the same may be a little bit better" since last week when prednisone was increased to 20 mg daily.  Patient is required her rescue inhaler 3 times over the last week.  Overall patient is feeling well.  Patient does have questions about follow-up  from pulmonary rehab referral from earlier appointments.  Will address today.  Patient also arrived to office today with simply go POC.  On arrival patient's POC battery does not appear charge.  Patient is not getting oxygen.  Patient is on SPO2 of 74%.  Patient is placed on 2 L of oxygen in office and returns quickly to 92%.  Allergies  Allergen Reactions  . Lipitor [Atorvastatin] Other (See Comments)    Memory issues  . Requip [Ropinirole Hcl] Other (See Comments)    Pt reports feeling generally unwell on this medication    Immunization History  Administered Date(s) Administered  . Influenza, High Dose Seasonal PF 04/04/2017  . Pneumococcal Polysaccharide-23 06/30/2016    Past Medical History:  Diagnosis Date  . Arthritis    fingers  . Depression   . Dizziness    in AM, getting out of bed  . Dysrhythmia    "skips a beat" sometimes - followed by PCP  . Hypercholesteremia   . Hypertension   . Neuropathy    bilateral feet  . Shortness of breath dyspnea   . Sleep apnea    has CPAP, doesn't use  . Umbilical hernia     Tobacco History: Social History   Tobacco Use  Smoking Status Former Smoker  . Packs/day: 1.00  . Years: 29.00  . Pack years: 29.00  . Types: Cigarettes  . Last attempt to quit: 06/28/1986  . Years since quitting: 31.4  Smokeless Tobacco Never Used  Tobacco Comment   quit 40+ yrs ago, 1 PPD for  a few years   Counseling given: Not Answered Comment: quit 40+ yrs ago, 1 PPD for a few years    Outpatient Encounter Medications as of 11/17/2017  Medication Sig  . albuterol (PROVENTIL HFA;VENTOLIN HFA) 108 (90 Base) MCG/ACT inhaler Inhale 1-2 puffs into the lungs every 6 (six) hours as needed for wheezing or shortness of breath.  . etodolac (LODINE) 300 MG capsule Take 300 mg by mouth 2 (two) times daily.  . fenofibrate micronized (LOFIBRA) 134 MG capsule Take 134 mg by mouth daily before breakfast.  . furosemide (LASIX) 80 MG tablet Take 80 mg by mouth  every morning.  Marland Kitchen losartan (COZAAR) 100 MG tablet Take 100 mg by mouth daily.  . meloxicam (MOBIC) 15 MG tablet Take 15 mg by mouth daily.   . metoprolol succinate (TOPROL-XL) 50 MG 24 hr tablet Take 50 mg by mouth daily.  . predniSONE (DELTASONE) 20 MG tablet Take 1 tablet (20 mg total) by mouth daily with breakfast.  . venlafaxine XR (EFFEXOR-XR) 150 MG 24 hr capsule Take 150 mg by mouth daily.  . [DISCONTINUED] albuterol (PROVENTIL HFA;VENTOLIN HFA) 108 (90 Base) MCG/ACT inhaler Inhale 1-2 puffs into the lungs every 6 (six) hours as needed for wheezing or shortness of breath.  . [DISCONTINUED] predniSONE (DELTASONE) 20 MG tablet Take 1 tablet (20 mg total) by mouth daily with breakfast.  . azithromycin (ZITHROMAX) 250 MG tablet Take 2 on day one then 1 daily x 4 days (Patient not taking: Reported on 11/17/2017)  . benzonatate (TESSALON) 100 MG capsule Take 1 capsule (100 mg total) by mouth every 8 (eight) hours. (Patient not taking: Reported on 11/17/2017)  . [DISCONTINUED] predniSONE (DELTASONE) 10 MG tablet 2 tabs daily for 1 week , 1 tab daily for 1 week , and 1/2 daily for 1 week and stop (Patient not taking: Reported on 11/17/2017)   No facility-administered encounter medications on file as of 11/17/2017.      Review of Systems  Constitutional: +occasional fatigue   No  weight loss, night sweats,  fevers, chills HEENT:   No headaches,  Difficulty swallowing,  Tooth/dental problems, or  Sore throat, No sneezing, itching, ear ache, nasal congestion, post nasal drip  CV: +LE swelling (chronic) No chest pain,  orthopnea, PND, dizziness, palpitations, syncope  GI: No heartburn, indigestion, abdominal pain, nausea, vomiting, diarrhea, change in bowel habits, loss of appetite, bloody stools Resp: +shortness of breath with exertion No excess mucus, no productive cough,  No non-productive cough,  No coughing up of blood.  No change in color of mucus.  No wheezing.  No chest wall deformity Skin: no  rash, lesions, no skin changes. GU: no dysuria, change in color of urine, no urgency or frequency.  No flank pain, no hematuria  MS:  No joint pain or swelling.  No decreased range of motion.  No back pain. Psych:  No change in mood or affect. No depression or anxiety.  No memory loss.   Physical Exam  BP (!) 122/58 (BP Location: Left Arm, Cuff Size: Normal)   Pulse 88   Ht 4\' 11"  (1.499 m)   Wt 256 lb (116.1 kg)   SpO2 94%   BMI 51.71 kg/m   GEN: A/Ox3; pleasant , NAD, well nourished, obese   HEENT:  [place]/AT,  EACs-cerumen bilaterally, TMs-wnl, NOSE-clear, THROAT-clear, Mallampati III, no lesions, no postnasal drip or exudate noted.   NECK: Supple w/ fair ROM; no JVD; no lymphadenopathy.    RESP:  +minor expiratory rhonchi  no accessory muscle use, no dullness to percussion   CARD:  +2+ edema in LE, RRR, no m/r/g, pulses intact, no cyanosis or clubbing.  GI:   Soft & nt; nml bowel sounds; no organomegaly or masses detected.   Musco: Warm bil, no deformities or joint swelling noted.   Neuro: alert, no focal deficits noted.    Skin: Warm, no lesions or rashes    Lab Results:  CBC    Component Value Date/Time   WBC 10.2 07/20/2017 0932   RBC 3.99 07/20/2017 0932   HGB 12.7 07/20/2017 0932   HCT 37.7 07/20/2017 0932   PLT 283.0 07/20/2017 0932   MCV 94.5 07/20/2017 0932   MCH 30.8 02/13/2017 1825   MCHC 33.8 07/20/2017 0932   RDW 13.2 07/20/2017 0932   LYMPHSABS 3.4 07/20/2017 0932   MONOABS 0.7 07/20/2017 0932   EOSABS 0.2 07/20/2017 0932   BASOSABS 0.1 07/20/2017 0932    BMET    Component Value Date/Time   NA 138 02/13/2017 1825   K 3.3 (L) 02/13/2017 1825   CL 102 02/13/2017 1825   CO2 25 02/13/2017 1825   GLUCOSE 126 (H) 02/13/2017 1825   BUN 15 02/13/2017 1825   CREATININE 1.05 (H) 02/13/2017 1825   CALCIUM 8.8 (L) 02/13/2017 1825   GFRNONAA 50 (L) 02/13/2017 1825   GFRAA 58 (L) 02/13/2017 1825    BNP    Component Value Date/Time   BNP 115.1  (H) 02/13/2017 1825    ProBNP No results found for: PROBNP  Imaging: Dg Chest 2 View  Result Date: 11/07/2017 CLINICAL DATA:  Congestion for the past week. EXAM: CHEST - 2 VIEW COMPARISON:  CT chest dated February 22, 2017. Chest x-ray dated February 13, 2017. FINDINGS: The heart size and mediastinal contours are within normal limits. Normal pulmonary vascularity. Coarsened interstitial markings are similar to prior study. No focal consolidation, pleural effusion, or pneumothorax. No acute osseous abnormality. IMPRESSION: 1.  No active cardiopulmonary disease. 2. Mildly coarsened interstitial markings bilaterally, similar to prior study, likely reflecting underlying chronic interstitial lung disease as seen on prior chest CT. Electronically Signed   By: Titus Dubin M.D.   On: 11/07/2017 14:51     Assessment & Plan:   77 year old pleasant female patient presents today for one-week follow-up visit.  Will continue same plan of care.  Refilled albuterol and prednisone today.  Referral to DME for follow-up regarding POC battery.  Discussed with patient at length the importance of wearing oxygen in order to maintain oxygen saturation greater than 90%. Patient also wants to establish with primary care on the first floor.  Followed up regarding her referral to pulmonary rehab placed back in April.  Patient also instructed to follow-up with her primary care provider regarding her daily prednisone needs, blood sugar management, lower extremity swelling, and diuretic treatment plan.  Patient will come back in 6 weeks to see Dr. Halford Chessman patient agrees to call if symptoms worsen or not improving to get scheduled to be seen sooner.   Primary care follow-up >>> Notify primary care the year needing 20 mg of prednisone daily and may need close monitoring of glucose especially since she have a past medical history of being prediabetic >>> Notify primary care of your lower extremity swelling in spite of you taking 80  mg of Lasix daily >>> If you are requiring more diuretic you probably need blood work to monitor your kidney functioning   ILD (interstitial lung disease) ([place]) Refilled albuterol today  Continue prednisone 20 mg daily >>>refilled Referral to DME  >>>to address POC battery and follow up with patient [facility] for Primary Care  >>> She wants to establish 1st floor Primary care at [facility]  >>> Placed back in April should hear something by June call office if you do not hear anything by mid June.    Chronic respiratory failure with hypoxia (HCC) Refilled albuterol today  Continue prednisone 20 mg daily >>>refilled  Referral to DME  >>>to address POC battery and follow up with patient  [facility] for Primary Care  >>> She wants to establish 1st floor Primary care at [place]  >>> Placed back in April should hear something by June call office if you do not hear anything by mid June.      Lauraine Rinne, NP 11/17/2017

## 2017-11-22 NOTE — Progress Notes (Signed)
Reviewed and agree with assessment/plan.   Antiono Ettinger, MD  Pulmonary/Critical Care 06/23/2016, 12:24 PM Pager:  [phone]  

## 2017-11-30 ENCOUNTER — Other Ambulatory Visit (INDEPENDENT_AMBULATORY_CARE_PROVIDER_SITE_OTHER): Payer: Medicare Other

## 2017-11-30 ENCOUNTER — Ambulatory Visit (INDEPENDENT_AMBULATORY_CARE_PROVIDER_SITE_OTHER): Payer: Medicare Other | Admitting: Internal Medicine

## 2017-11-30 ENCOUNTER — Encounter: Payer: Self-pay | Admitting: Internal Medicine

## 2017-11-30 VITALS — BP 136/86 | HR 100 | Temp 97.8°F | Ht 59.0 in | Wt 252.0 lb

## 2017-11-30 DIAGNOSIS — E785 Hyperlipidemia, unspecified: Secondary | ICD-10-CM

## 2017-11-30 DIAGNOSIS — I10 Essential (primary) hypertension: Secondary | ICD-10-CM

## 2017-11-30 DIAGNOSIS — Z23 Encounter for immunization: Secondary | ICD-10-CM

## 2017-11-30 DIAGNOSIS — F329 Major depressive disorder, single episode, unspecified: Secondary | ICD-10-CM | POA: Diagnosis not present

## 2017-11-30 DIAGNOSIS — N183 Chronic kidney disease, stage 3 unspecified: Secondary | ICD-10-CM

## 2017-11-30 DIAGNOSIS — F32A Depression, unspecified: Secondary | ICD-10-CM

## 2017-11-30 DIAGNOSIS — K219 Gastro-esophageal reflux disease without esophagitis: Secondary | ICD-10-CM | POA: Insufficient documentation

## 2017-11-30 DIAGNOSIS — C541 Malignant neoplasm of endometrium: Secondary | ICD-10-CM

## 2017-11-30 HISTORY — DX: Malignant neoplasm of endometrium: C54.1

## 2017-11-30 HISTORY — DX: Hyperlipidemia, unspecified: E78.5

## 2017-11-30 HISTORY — DX: Chronic kidney disease, stage 3 unspecified: N18.30

## 2017-11-30 HISTORY — DX: Gastro-esophageal reflux disease without esophagitis: K21.9

## 2017-11-30 LAB — BASIC METABOLIC PANEL
BUN: 42 mg/dL — AB (ref 6–23)
CALCIUM: 9.2 mg/dL (ref 8.4–10.5)
CHLORIDE: 96 meq/L (ref 96–112)
CO2: 29 mEq/L (ref 19–32)
Creatinine, Ser: 1.62 mg/dL — ABNORMAL HIGH (ref 0.40–1.20)
GFR: 32.75 mL/min — ABNORMAL LOW (ref >60.00)
GLUCOSE: 183 mg/dL — AB (ref 70–99)
Potassium: 4.4 mEq/L (ref 3.5–5.1)
Sodium: 136 mEq/L (ref 135–145)

## 2017-11-30 LAB — TSH: TSH: 1.58 u[IU]/mL (ref 0.35–4.50)

## 2017-11-30 LAB — HEPATIC FUNCTION PANEL
ALT: 24 U/L (ref 0–35)
AST: 19 U/L (ref 0–37)
Albumin: 3.9 g/dL (ref 3.5–5.2)
Alkaline Phosphatase: 46 U/L (ref 39–117)
BILIRUBIN DIRECT: 0.2 mg/dL (ref 0.0–0.3)
BILIRUBIN TOTAL: 0.6 mg/dL (ref 0.2–1.2)
Total Protein: 7.3 g/dL (ref 6.0–8.3)

## 2017-11-30 LAB — CBC WITH DIFFERENTIAL/PLATELET
Basophils Absolute: 0.1 10*3/uL (ref 0.0–0.1)
Basophils Relative: 0.8 % (ref 0.0–3.0)
EOS PCT: 0.1 % (ref 0.0–5.0)
Eosinophils Absolute: 0 10*3/uL (ref 0.0–0.7)
HEMATOCRIT: 40.1 % (ref 36.0–46.0)
Hemoglobin: 13.5 g/dL (ref 12.0–15.0)
LYMPHS PCT: 13.6 % (ref 12.0–46.0)
Lymphs Abs: 1.8 10*3/uL (ref 0.7–4.0)
MCHC: 33.8 g/dL (ref 30.0–36.0)
MCV: 93.4 fl (ref 78.0–100.0)
MONOS PCT: 2.5 % — AB (ref 3.0–12.0)
Monocytes Absolute: 0.3 10*3/uL (ref 0.1–1.0)
Neutro Abs: 10.8 10*3/uL — ABNORMAL HIGH (ref 1.4–7.7)
Neutrophils Relative %: 83 % — ABNORMAL HIGH (ref 43.0–77.0)
Platelets: 314 10*3/uL (ref 150.0–400.0)
RBC: 4.29 Mil/uL (ref 3.87–5.11)
RDW: 14.1 % (ref 11.5–15.5)
WBC: 13.1 10*3/uL — ABNORMAL HIGH (ref 4.0–10.5)

## 2017-11-30 LAB — URINALYSIS, ROUTINE W REFLEX MICROSCOPIC
Hgb urine dipstick: NEGATIVE
Ketones, ur: NEGATIVE
Leukocytes, UA: NEGATIVE
Nitrite: NEGATIVE
RBC / HPF: NONE SEEN (ref 0–?)
SPECIFIC GRAVITY, URINE: 1.01 (ref 1.000–1.030)
TOTAL PROTEIN, URINE-UPE24: NEGATIVE
Urine Glucose: NEGATIVE
Urobilinogen, UA: 1 (ref 0.0–1.0)
pH: 7 (ref 5.0–8.0)

## 2017-11-30 LAB — LIPID PANEL
CHOL/HDL RATIO: 3
Cholesterol: 223 mg/dL — ABNORMAL HIGH (ref 0–200)
HDL: 74.4 mg/dL (ref >39.00)
LDL CALC: 127 mg/dL — AB (ref 0–99)
NONHDL: 148.46
TRIGLYCERIDES: 106 mg/dL (ref 0.0–149.0)
VLDL: 21.2 mg/dL (ref 0.0–40.0)

## 2017-11-30 MED ORDER — VENLAFAXINE HCL ER 150 MG PO CP24: 150.0000 mg | ORAL_CAPSULE | Freq: Every day | ORAL | 3 refills | Status: DC

## 2017-11-30 MED ORDER — BENZONATATE 100 MG PO CAPS: 100.0000 mg | ORAL_CAPSULE | Freq: Two times a day (BID) | ORAL | 5 refills | Status: DC | PRN

## 2017-11-30 NOTE — Assessment & Plan Note (Signed)
Mild recent worsening, no SI or HI, for restart effexor, declines counseling referral

## 2017-11-30 NOTE — Assessment & Plan Note (Signed)
stable overall by history and exam, recent data reviewed with pt, and pt to continue medical treatment as before,  to f/u any worsening symptoms or concerns BP Readings from Last 3 Encounters:  11/30/17 136/86  11/17/17 (!) 122/58  11/07/17 132/88

## 2017-11-30 NOTE — Assessment & Plan Note (Signed)
stable overall by history and exam, for f/u labs, and pt to continue medical treatment as before,  to f/u any worsening symptoms or concerns

## 2017-11-30 NOTE — Progress Notes (Signed)
Subjective:    Patient ID: Kathy [name], female    DOB: 05-13-1941, 77 y.o.   MRN: [id number]  HPI  Here to establish in transfer care to new PCP;  Overall doing ok;  Pt denies Chest pain, worsening SOB, DOE, wheezing, orthopnea, PND, worsening LE edema, palpitations, dizziness or syncope.  Pt denies neurological change such as new headache, facial or extremity weakness.  Pt denies polydipsia, polyuria, or low sugar symptoms. Pt states overall good compliance with treatment and medications, good tolerability, and has been trying to follow appropriate diet. No fever, night sweats, wt loss, loss of appetite, or other constitutional symptoms.  Pt states good ability with ADL's, has low fall risk, home safety reviewed and adequate, no other significant changes in hearing or vision, and only occasionally active with exercise.  Has had mild worsening depressive symptoms, but no suicidal ideation, or panic; has been out of effexor for many months. Past Medical History:  Diagnosis Date  . Arthritis    fingers  . CKD (chronic kidney disease) stage 3, GFR 30-59 ml/min (HCC) 11/30/2017  . Depression   . Dizziness    in AM, getting out of bed  . Dysrhythmia    "skips a beat" sometimes - followed by PCP  . Endometrial ca ([place]) 11/30/2017   S/p surgury 1990's  . GERD (gastroesophageal reflux disease) 11/30/2017  . HLD (hyperlipidemia) 11/30/2017  . Hypercholesteremia   . Hypertension   . Neuropathy    bilateral feet  . Shortness of breath dyspnea   . Sleep apnea    has CPAP, doesn't use  . Umbilical hernia    Past Surgical History:  Procedure Laterality Date  . ABDOMINAL HYSTERECTOMY    . BROW LIFT Bilateral 07/15/2015   Procedure: BLEPHAROPLASTY;  Surgeon: Karle Starch, MD;  Location: [facility];  Service: Ophthalmology;  Laterality: Bilateral;  . CHOLECYSTECTOMY    . HAMMER TOE SURGERY    . HERNIA REPAIR    . KNEE ARTHROSCOPY Bilateral   . PTOSIS REPAIR Bilateral 07/15/2015   Procedure:  PTOSIS REPAIR;  Surgeon: Karle Starch, MD;  Location: [facility];  Service: Ophthalmology;  Laterality: Bilateral;  CPAP  . TONSILLECTOMY      reports that she quit smoking about 31 years ago. Her smoking use included cigarettes. She has a 29.00 pack-year smoking history. She has never used smokeless tobacco. She reports that she does not drink alcohol or use drugs. family history includes Congestive Heart Failure in her mother; Parkinson's disease in her father; Stroke in her father. Allergies  Allergen Reactions  . Lipitor [Atorvastatin] Other (See Comments)    Memory issues  . Requip [Ropinirole Hcl] Other (See Comments)    Pt reports feeling generally unwell on this medication   Current Outpatient Medications on File Prior to Visit  Medication Sig Dispense Refill  . albuterol (PROVENTIL HFA;VENTOLIN HFA) 108 (90 Base) MCG/ACT inhaler Inhale 1-2 puffs into the lungs every 6 (six) hours as needed for wheezing or shortness of breath. 1 Inhaler 2  . etodolac (LODINE) 300 MG capsule Take 300 mg by mouth 2 (two) times daily.    . fenofibrate micronized (LOFIBRA) 134 MG capsule Take 134 mg by mouth daily before breakfast.    . furosemide (LASIX) 80 MG tablet Take 80 mg by mouth every morning.  1  . losartan (COZAAR) 100 MG tablet Take 100 mg by mouth daily.    . meloxicam (MOBIC) 15 MG tablet Take 15 mg by mouth  daily.     . metoprolol succinate (TOPROL-XL) 50 MG 24 hr tablet Take 50 mg by mouth daily.  3   No current facility-administered medications on file prior to visit.           Review of Systems  Constitutional: Negative for other unusual diaphoresis or sweats HENT: Negative for ear discharge or swelling Eyes: Negative for other worsening visual disturbances Respiratory: Negative for stridor or other swelling  Gastrointestinal: Negative for worsening distension or other blood Genitourinary: Negative for retention or other urinary change Musculoskeletal: Negative for  other MSK pain or swelling Skin: Negative for color change or other new lesions Neurological: Negative for worsening tremors and other numbness  Psychiatric/Behavioral: Negative for worsening agitation or other fatigue All other system neg per pt    Objective:   Physical Exam BP 136/86   Pulse 100   Temp 97.8 F (36.6 C) (Oral)   Ht 4\' 11"  (1.499 m)   Wt 252 lb (114.3 kg)   SpO2 96%   BMI 50.90 kg/m = On home o2 2l VS noted,  Constitutional: Pt appears in NAD HENT: Head: NCAT.  Right Ear: External ear normal.  Left Ear: External ear normal.  Eyes: . Pupils are equal, round, and reactive to light. Conjunctivae and EOM are normal Nose: without d/c or deformity Neck: Neck supple. Gross normal ROM Cardiovascular: Normal rate and regular rhythm.   Pulmonary/Chest: Effort normal and breath sounds without rales or wheezing.  Abd:  Soft, NT, ND, + BS, no organomegaly Neurological: Pt is alert. At baseline orientation, motor grossly intact Skin: Skin is warm. No rashes, other new lesions, no LE edema Psychiatric: Pt behavior is normal without agitation , + depressed affect No other exam findings  Lab Results  Component Value Date   WBC 10.2 07/20/2017   HGB 12.7 07/20/2017   HCT 37.7 07/20/2017   PLT 283.0 07/20/2017   GLUCOSE 126 (H) 02/13/2017   NA 138 02/13/2017   K 3.3 (L) 02/13/2017   CL 102 02/13/2017   CREATININE 1.05 (H) 02/13/2017   BUN 15 02/13/2017   CO2 25 02/13/2017      Assessment & Plan:

## 2017-11-30 NOTE — Assessment & Plan Note (Signed)
stable overall by history and exam, for f/u lab, and pt to continue medical treatment as before,  to f/u any worsening symptoms or concerns

## 2017-11-30 NOTE — Patient Instructions (Addendum)
You had the Prevnar 13 pneumonia shot today, and Tdap tetanus shots  Please continue all other medications as before, including to restart the [facility] for depression  Please have the pharmacy call with any other refills you may need.  Please continue your efforts at being more active, low cholesterol diet, and weight control.  You are otherwise up to date with prevention measures today.  Please keep your appointments with your specialists as you may have planned  Please go to the LAB in the Basement (turn left off the elevator) for the tests to be done today  You will be contacted by phone if any changes need to be made immediately.  Otherwise, you will receive a letter about your results with an explanation, but please check with MyChart first.  Please remember to sign up for MyChart if you have not done so, as this will be important to you in the future with finding out test results, communicating by private email, and scheduling acute appointments online when needed.  Please return in 6 months, or sooner if needed

## 2017-12-01 ENCOUNTER — Telehealth: Payer: Self-pay

## 2017-12-01 ENCOUNTER — Other Ambulatory Visit: Payer: Self-pay | Admitting: Internal Medicine

## 2017-12-01 DIAGNOSIS — N289 Disorder of kidney and ureter, unspecified: Secondary | ICD-10-CM

## 2017-12-01 NOTE — Telephone Encounter (Signed)
-----   Message from Biagio Borg, MD sent at 12/01/2017  7:58 AM EDT ----- Left message on MyChart, pt to cont same tx except  The test results show that your current treatment is OK, except the kidneys are much slowed compared within the past year.  Please do not take the lasix, losartan, and any anti-inflammatory such as advil, alleve, meloxicam or lodine.  Please drink a bit more fluids in the next few days, and return to the lab on Monday June 10 for repeat testing,  If not improved, further evaluation and possible nephrology referral would need to be considered.    Please also plan to make a return office visit in 2 weeks with me, or sooner if felt needed    Shirron to please inform pt, I will do lab order, and pt will need ROV in 2 wks

## 2017-12-01 NOTE — Telephone Encounter (Signed)
Pt has been informed and expressed understanding.  

## 2017-12-02 ENCOUNTER — Telehealth: Payer: Self-pay | Admitting: *Deleted

## 2017-12-02 NOTE — Telephone Encounter (Signed)
Patient Requested to have medical records released from [facility] received,records will be forwarded interoffice to [place].

## 2017-12-05 ENCOUNTER — Other Ambulatory Visit (INDEPENDENT_AMBULATORY_CARE_PROVIDER_SITE_OTHER): Payer: Medicare Other

## 2017-12-05 ENCOUNTER — Encounter: Payer: Self-pay | Admitting: Internal Medicine

## 2017-12-05 ENCOUNTER — Telehealth: Payer: Self-pay

## 2017-12-05 DIAGNOSIS — N289 Disorder of kidney and ureter, unspecified: Secondary | ICD-10-CM

## 2017-12-05 LAB — BASIC METABOLIC PANEL
BUN: 19 mg/dL (ref 6–23)
CALCIUM: 8.9 mg/dL (ref 8.4–10.5)
CO2: 32 mEq/L (ref 19–32)
CREATININE: 1.06 mg/dL (ref 0.40–1.20)
Chloride: 101 mEq/L (ref 96–112)
GFR: 53.43 mL/min — AB (ref >60.00)
Glucose, Bld: 109 mg/dL — ABNORMAL HIGH (ref 70–99)
Potassium: 3.9 mEq/L (ref 3.5–5.1)
SODIUM: 141 meq/L (ref 135–145)

## 2017-12-05 NOTE — Telephone Encounter (Signed)
Pt has been informed and expressed understanding.  

## 2017-12-05 NOTE — Telephone Encounter (Signed)
-----   Message from Biagio Borg, MD sent at 12/05/2017  1:40 PM EDT ----- Left message on MyChart, pt to cont same tx except  The test results show that your current treatment is OK, as the kidney function appears back to your baseline starting level.  Please restart the lasix and losartan at HALF of each per day, and we'll see you at your next visit.    Eudelia Hiltunen to please inform pt, and make sure pt has ROV in 1-2 wks

## 2017-12-06 ENCOUNTER — Telehealth (HOSPITAL_COMMUNITY): Payer: Self-pay

## 2017-12-06 NOTE — Telephone Encounter (Signed)
Called patient in regards to Pulmonary Rehab - Scheduled orientation on 12/19/2017 at 12:00pm. Patient will attend the 1:30pm exc class. Mailed packet.

## 2017-12-12 ENCOUNTER — Other Ambulatory Visit (INDEPENDENT_AMBULATORY_CARE_PROVIDER_SITE_OTHER): Payer: Medicare Other

## 2017-12-12 ENCOUNTER — Encounter: Payer: Self-pay | Admitting: Internal Medicine

## 2017-12-12 ENCOUNTER — Ambulatory Visit (INDEPENDENT_AMBULATORY_CARE_PROVIDER_SITE_OTHER): Payer: Medicare Other | Admitting: Internal Medicine

## 2017-12-12 VITALS — BP 126/82 | HR 89 | Temp 98.5°F | Ht 59.0 in | Wt 265.0 lb

## 2017-12-12 DIAGNOSIS — N183 Chronic kidney disease, stage 3 (moderate): Secondary | ICD-10-CM

## 2017-12-12 DIAGNOSIS — I5189 Other ill-defined heart diseases: Secondary | ICD-10-CM | POA: Diagnosis not present

## 2017-12-12 DIAGNOSIS — N179 Acute kidney failure, unspecified: Secondary | ICD-10-CM

## 2017-12-12 DIAGNOSIS — I10 Essential (primary) hypertension: Secondary | ICD-10-CM | POA: Diagnosis not present

## 2017-12-12 DIAGNOSIS — I5031 Acute diastolic (congestive) heart failure: Secondary | ICD-10-CM

## 2017-12-12 DIAGNOSIS — N189 Chronic kidney disease, unspecified: Secondary | ICD-10-CM

## 2017-12-12 DIAGNOSIS — E785 Hyperlipidemia, unspecified: Secondary | ICD-10-CM

## 2017-12-12 LAB — BASIC METABOLIC PANEL
BUN: 25 mg/dL — AB (ref 6–23)
CALCIUM: 9.1 mg/dL (ref 8.4–10.5)
CO2: 30 meq/L (ref 19–32)
Chloride: 98 mEq/L (ref 96–112)
Creatinine, Ser: 1.29 mg/dL — ABNORMAL HIGH (ref 0.40–1.20)
GFR: 42.59 mL/min — ABNORMAL LOW (ref >60.00)
Glucose, Bld: 107 mg/dL — ABNORMAL HIGH (ref 70–99)
Potassium: 3.8 mEq/L (ref 3.5–5.1)
SODIUM: 139 meq/L (ref 135–145)

## 2017-12-12 LAB — BRAIN NATRIURETIC PEPTIDE: Pro B Natriuretic peptide (BNP): 136 pg/mL — ABNORMAL HIGH (ref 0.0–100.0)

## 2017-12-12 MED ORDER — ATORVASTATIN CALCIUM 10 MG PO TABS: 10.0000 mg | ORAL_TABLET | Freq: Every day | ORAL | 3 refills | Status: DC

## 2017-12-12 MED ORDER — FUROSEMIDE 40 MG PO TABS: ORAL_TABLET | ORAL | 11 refills | Status: DC

## 2017-12-12 NOTE — Assessment & Plan Note (Signed)
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

## 2017-12-12 NOTE — Progress Notes (Signed)
Subjective:    Patient ID: Kathy [name], female    DOB: August 19, 1940, 77 y.o.   MRN: [id number]  HPI  Here to f/u; overall doing ok,  Pt denies chest pain, increasing sob or doe, wheezing, orthopnea, PND, palpitations, dizziness or syncope, but has had overall wt up and LE edema increased in response to recent med changes.  Jun 5 she had AKI on CKD with cr up to 1.6 with baseline of 1.0; later improved/resolved June 10 with stopping lasix, losartan, nsaids and increased oral fluids.  At that time by phone we asked her to restart the lasix 40 and losartan 50 (equal to 1/2 doses of the previous), and unfortunately has had significant fluid retention with this.  She is not orthostatic today .  Pt denies new neurological symptoms such as new headache, or facial or extremity weakness or numbness.  Pt denies polydipsia, polyuria, or low sugar episode.  Pt states overall good compliance with meds, mostly trying to follow appropriate diet.  No new complaints or interval hx Wt Readings from Last 3 Encounters:  12/12/17 265 lb (120.2 kg)  11/30/17 252 lb (114.3 kg)  11/17/17 256 lb (116.1 kg)   BP Readings from Last 3 Encounters:  12/12/17 126/82  11/30/17 136/86  11/17/17 (!) 122/58   Past Medical History:  Diagnosis Date  . Arthritis    fingers  . CKD (chronic kidney disease) stage 3, GFR 30-59 ml/min (HCC) 11/30/2017  . Depression   . Dizziness    in AM, getting out of bed  . Dysrhythmia    "skips a beat" sometimes - followed by PCP  . Endometrial ca ([place]) 11/30/2017   S/p surgury 1990's  . GERD (gastroesophageal reflux disease) 11/30/2017  . HLD (hyperlipidemia) 11/30/2017  . Hypercholesteremia   . Hypertension   . Neuropathy    bilateral feet  . Shortness of breath dyspnea   . Sleep apnea    has CPAP, doesn't use  . Umbilical hernia    Past Surgical History:  Procedure Laterality Date  . ABDOMINAL HYSTERECTOMY    . BROW LIFT Bilateral 07/15/2015   Procedure: BLEPHAROPLASTY;  Surgeon: Karle Starch, MD;  Location: [facility];  Service: Ophthalmology;  Laterality: Bilateral;  . CHOLECYSTECTOMY    . HAMMER TOE SURGERY    . HERNIA REPAIR    . KNEE ARTHROSCOPY Bilateral   . PTOSIS REPAIR Bilateral 07/15/2015   Procedure: PTOSIS REPAIR;  Surgeon: Karle Starch, MD;  Location: [facility];  Service: Ophthalmology;  Laterality: Bilateral;  CPAP  . TONSILLECTOMY      reports that she quit smoking about 31 years ago. Her smoking use included cigarettes. She has a 29.00 pack-year smoking history. She has never used smokeless tobacco. She reports that she does not drink alcohol or use drugs. family history includes Congestive Heart Failure in her mother; Parkinson's disease in her father; Stroke in her father. Allergies  Allergen Reactions  . Lipitor [Atorvastatin] Other (See Comments)    Memory issues  . Requip [Ropinirole Hcl] Other (See Comments)    Pt reports feeling generally unwell on this medication   Current Outpatient Medications on File Prior to Visit  Medication Sig Dispense Refill  . albuterol (PROVENTIL HFA;VENTOLIN HFA) 108 (90 Base) MCG/ACT inhaler Inhale 1-2 puffs into the lungs every 6 (six) hours as needed for wheezing or shortness of breath. 1 Inhaler 2  . benzonatate (TESSALON PERLES) 100 MG capsule Take 1 capsule (100 mg total) by mouth  2 (two) times daily as needed for cough. 60 capsule 5  . fenofibrate micronized (LOFIBRA) 134 MG capsule Take 134 mg by mouth daily before breakfast.    . losartan (COZAAR) 100 MG tablet Take 50 mg by mouth daily.    . metoprolol succinate (TOPROL-XL) 50 MG 24 hr tablet Take 50 mg by mouth daily.  3  . venlafaxine XR (EFFEXOR-XR) 150 MG 24 hr capsule Take 1 capsule (150 mg total) by mouth daily. 90 capsule 3   No current facility-administered medications on file prior to visit.    Review of Systems  Constitutional: Negative for other unusual diaphoresis or sweats HENT: Negative for ear discharge or swelling Eyes:  Negative for other worsening visual disturbances Respiratory: Negative for stridor or other swelling  Gastrointestinal: Negative for worsening distension or other blood Genitourinary: Negative for retention or other urinary change Musculoskeletal: Negative for other MSK pain or swelling Skin: Negative for color change or other new lesions Neurological: Negative for worsening tremors and other numbness  Psychiatric/Behavioral: Negative for worsening agitation or other fatigue All other system neg per pt    Objective:   Physical Exam BP 126/82   Pulse 89   Temp 98.5 F (36.9 C) (Oral)   Ht 4\' 11"  (1.499 m)   Wt 265 lb (120.2 kg)   SpO2 93%   BMI 53.52 kg/m  VS noted,  Constitutional: Pt appears in NAD HENT: Head: NCAT.  Right Ear: External ear normal.  Left Ear: External ear normal.  Eyes: . Pupils are equal, round, and reactive to light. Conjunctivae and EOM are normal Nose: without d/c or deformity Neck: Neck supple. Gross normal ROM Cardiovascular: Normal rate and regular rhythm.   Pulmonary/Chest: Effort normal and breath sounds without rales or wheezing.  Abd:  Soft, NT, ND, + BS, no organomegaly Neurological: Pt is alert. At baseline orientation, motor grossly intact Skin: Skin is warm. No rashes, other new lesions, 1+ bilat LE edema to knees Psychiatric: Pt behavior is normal without agitation  No other exam findings  Lab Results  Component Value Date   WBC 13.1 (H) 11/30/2017   HGB 13.5 11/30/2017   HCT 40.1 11/30/2017   PLT 314.0 11/30/2017   GLUCOSE 109 (H) 12/05/2017   CHOL 223 (H) 11/30/2017   TRIG 106.0 11/30/2017   HDL 74.40 11/30/2017   LDLCALC 127 (H) 11/30/2017   ALT 24 11/30/2017   AST 19 11/30/2017   NA 141 12/05/2017   K 3.9 12/05/2017   CL 101 12/05/2017   CREATININE 1.06 12/05/2017   BUN 19 12/05/2017   CO2 32 12/05/2017   TSH 1.58 11/30/2017       Assessment & Plan:

## 2017-12-12 NOTE — Assessment & Plan Note (Addendum)
Resolved initially with med changes as per HPI, then with further restart lower dose medication for 1 wk - so for repeat BMP today  Note:  Total time for pt hx, exam, review of record with pt in the room, determination of diagnoses and plan for further eval and tx is > 40 min, with over 50% spent in coordination and counseling of patient including the differential dx, tx, further evaluation and other management of AKi on CKD, HTN, HLD, and diast dysfunction

## 2017-12-12 NOTE — Addendum Note (Signed)
Addended by: Biagio Borg on: 12/12/2017 09:04 AM   Modules accepted: Level of Service

## 2017-12-12 NOTE — Assessment & Plan Note (Signed)
Goal dll < 70 with CKD - for add lipitor 10 qd, cont fenofibrate

## 2017-12-12 NOTE — Patient Instructions (Signed)
Please continue all other medications as you are doing now, except OK to change the lasix (furosemide) to 40 mg in the AM every day, but also 40 mg in the evening as needed for persistent or worsening swelling or weight gain of more than 3-5 lbs  Please have the pharmacy call with any other refills you may need.  Please continue your efforts at being more active, low cholesterol diet, and weight control.  Please keep your appointments with your specialists as you may have planned  Please go to the LAB in the Basement (turn left off the elevator) for the tests to be done today  You will be contacted by phone if any changes need to be made immediately.  Otherwise, you will receive a letter about your results with an explanation, but please check with MyChart first.  Please remember to sign up for MyChart if you have not done so, as this will be important to you in the future with finding out test results, communicating by private email, and scheduling acute appointments online when needed.

## 2017-12-12 NOTE — Assessment & Plan Note (Signed)
Ok to continue the losartan 50 qd, for repeat bnp today

## 2017-12-19 ENCOUNTER — Encounter (HOSPITAL_COMMUNITY)
Admission: RE | Admit: 2017-12-19 | Discharge: 2017-12-19 | Disposition: A | Discharge status: Home / Self Care | Payer: Medicare Other | Source: Ambulatory Visit | Attending: Pulmonary Disease | Admitting: Pulmonary Disease

## 2017-12-19 VITALS — BP 102/62 | HR 95 | Wt 257.5 lb

## 2017-12-19 DIAGNOSIS — J849 Interstitial pulmonary disease, unspecified: Secondary | ICD-10-CM | POA: Insufficient documentation

## 2017-12-19 NOTE — Progress Notes (Signed)
Kathy [name] 77 y.o. female Pulmonary Rehab Orientation Note Patient arrived today in [facility] for orientation to Pulmonary Rehab. She was transported from [employer] via wheel chair. She can only stand for approximately 3 minutes at a time due to lower back pain, requiring her to sit down and the pain resolves.  She does carry portable oxygen. Per pt, she uses oxygen continuously. Color good, skin warm and dry. Patient is oriented to time and place. Patient's medical history, psychosocial health, and medications reviewed. Psychosocial assessment reveals pt lives with their spouse. 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 Pt hobbies include geneology. Pt reports her stress level is moderate. Areas of stress/anxiety include Health.  Pt does not exhibit signs of depression.  PHQ2/9 score 0/1. Pt shows fair  coping skills with positive outlook .  Offered emotional support and reassurance. Will continue to monitor and evaluate progress toward psychosocial goal(s) of improved quality of life by increasing her strength and stamina. Physical assessment reveals heart rate is normal, breath sounds clear to auscultation, no wheezes, rales, or rhonchi. Grip strength equal, strong. Distal pulses 1+ bilateral posterior tibial pulses present with 2+ peripheral edema bilateral with redness on the right foot, ankle,  and leg. She states this is a common state and is on a diuretic twice a day. Patient reports she does take medications as prescribed. Patient states she follows a Regular diet. The patient reports no specific efforts to gain or lose weight.. She is inactive from multiple health problems, but would like to lose weight, BMI is 50. Patient's weight will be monitored closely. She is very limited in the equipment she can exercise on, her knees will not allow her to make a complete revolution without severe knee pain, and she cannot stand for more than 3 minutes.  I encouraged  her to investigate her back issue with her primary care physician since this issue has never been addressed. The department nutritionist will meet with her one on one. Demonstration and practice of PLB using pulse oximeter. Patient able to return demonstration satisfactorily. Safety and hand hygiene in the exercise area reviewed with patient. Patient voices understanding of the information reviewed. Department expectations discussed with patient and achievable goals were set. The patient shows enthusiasm about attending the program and we look forward to working with this nice lady. The patient is scheduled for a 6 min walk test on Thursday, December 22, 2017 @ 3:30 pm and to begin exercise on Tuesday, January 03, 2018 in the 1:30 pm class..   [id number]

## 2017-12-22 ENCOUNTER — Encounter (HOSPITAL_COMMUNITY)
Admission: RE | Admit: 2017-12-22 | Discharge: 2017-12-22 | Disposition: A | Discharge status: Home / Self Care | Payer: Medicare Other | Source: Ambulatory Visit | Attending: Pulmonary Disease | Admitting: Pulmonary Disease

## 2017-12-22 DIAGNOSIS — J849 Interstitial pulmonary disease, unspecified: Secondary | ICD-10-CM

## 2017-12-26 ENCOUNTER — Encounter: Payer: Self-pay | Admitting: Internal Medicine

## 2017-12-26 NOTE — Progress Notes (Signed)
Pulmonary Individual Treatment Plan  Patient Details  Name: Kathy [name] MRN: [id number] Date of Birth: 1941-02-08 Referring Provider:     Pulmonary Rehab Walk Test from 12/22/2017 in [place]  Referring Provider  Dr. Halford Chessman      Initial Encounter Date:    Pulmonary Rehab Walk Test from 12/22/2017 in [place]  Date  12/26/17      Visit Diagnosis: Interstitial lung disease ([place])  Patient's Home Medications on Admission:   Current Outpatient Medications:  .  albuterol (PROVENTIL HFA;VENTOLIN HFA) 108 (90 Base) MCG/ACT inhaler, Inhale 1-2 puffs into the lungs every 6 (six) hours as needed for wheezing or shortness of breath., Disp: 1 Inhaler, Rfl: 2 .  atorvastatin (LIPITOR) 10 MG tablet, Take 1 tablet (10 mg total) by mouth daily. (Patient not taking: Reported on 12/19/2017), Disp: 90 tablet, Rfl: 3 .  benzonatate (TESSALON PERLES) 100 MG capsule, Take 1 capsule (100 mg total) by mouth 2 (two) times daily as needed for cough., Disp: 60 capsule, Rfl: 5 .  etodolac (LODINE) 300 MG capsule, Take 300 mg by mouth 2 (two) times daily., Disp: , Rfl:  .  fenofibrate micronized (LOFIBRA) 134 MG capsule, Take 134 mg by mouth daily before breakfast., Disp: , Rfl:  .  furosemide (LASIX) 40 MG tablet, 1 tab by mouth in the AM, and 1 tab by mouth in the PM as needed for persistent swelling or weight gain more than 3-5 lbs, Disp: 60 tablet, Rfl: 11 .  losartan (COZAAR) 100 MG tablet, Take 50 mg by mouth daily., Disp: , Rfl:  .  metoprolol succinate (TOPROL-XL) 50 MG 24 hr tablet, Take 50 mg by mouth daily., Disp: , Rfl: 3 .  venlafaxine XR (EFFEXOR-XR) 150 MG 24 hr capsule, Take 1 capsule (150 mg total) by mouth daily., Disp: 90 capsule, Rfl: 3  Past Medical History: Past Medical History:  Diagnosis Date  . Arthritis    fingers  . CKD (chronic kidney disease) stage 3, GFR 30-59 ml/min (HCC) 11/30/2017  . Depression   . Dizziness    in  AM, getting out of bed  . Dysrhythmia    "skips a beat" sometimes - followed by PCP  . Endometrial ca ([place]) 11/30/2017   S/p surgury 1990's  . GERD (gastroesophageal reflux disease) 11/30/2017  . HLD (hyperlipidemia) 11/30/2017  . Hypercholesteremia   . Hypertension   . Neuropathy    bilateral feet  . Shortness of breath dyspnea   . Sleep apnea    has CPAP, doesn't use  . Umbilical hernia     Tobacco Use: Social History   Tobacco Use  Smoking Status Former Smoker  . Packs/day: 1.00  . Years: 29.00  . Pack years: 29.00  . Types: Cigarettes  . Last attempt to quit: 06/28/1986  . Years since quitting: 31.5  Smokeless Tobacco Never Used  Tobacco Comment   quit 40+ yrs ago, 1 PPD for a few years    Labs: Recent Review Flowsheet Data    Labs for ITP Cardiac and Pulmonary Rehab Latest Ref Rng & Units 05/13/2008 07/31/2014 06/27/2016 11/30/2017   Cholestrol 0 - 200 mg/dL - - - 223(H)   LDLCALC 0 - 99 mg/dL - - - 127(H)   HDL >39.00 mg/dL - - - 74.40   Trlycerides 0.0 - 149.0 mg/dL - - - 106.0   TCO2 0 - 100 mmol/L 28 25 29  -      Capillary Blood Glucose:  Lab Results  Component Value Date   GLUCAP 134 (H) 06/29/2016     Pulmonary Assessment Scores: Pulmonary Assessment Scores    Row Name 12/26/17 0905         ADL UCSD   ADL Phase  Entry       mMRC Score   mMRC Score  4        Pulmonary Function Assessment: Pulmonary Function Assessment - 12/19/17 1307      Breath   Bilateral Breath Sounds  Clear    Shortness of Breath  Limiting activity;Yes       Exercise Target Goals: Date: 12/26/17  Exercise Program Goal: Individual exercise prescription set using results from initial 6 min walk test and THRR while considering  patient's activity barriers and safety.    Exercise Prescription Goal: Initial exercise prescription builds to 30-45 minutes a day of aerobic activity, 2-3 days per week.  Home exercise guidelines will be given to patient during program as part of  exercise prescription that the participant will acknowledge.  Activity Barriers & Risk Stratification: Activity Barriers & Cardiac Risk Stratification - 12/19/17 1313      Activity Barriers & Cardiac Risk Stratification   Activity Barriers  Back Problems;Shortness of Breath;Joint Problems;Arthritis       6 Minute Walk: 6 Minute Walk    Row Name 12/26/17 0905         6 Minute Walk   Phase  Initial     Distance  500 feet     Walk Time  - 4 minutes and 9 seconds     # of Rest Breaks  - 1 minute 51 seconds in total     MPH  0.94     METS  1.77     RPE  14     Perceived Dyspnea   3     Symptoms  Yes (comment)     Comments  7/10 lower back pain     Resting HR  97 bpm     Resting BP  100/52     Resting Oxygen Saturation   96 %     Exercise Oxygen Saturation  during 6 min walk  91 %     Max Ex. HR  125 bpm     Max Ex. BP  138/48       Interval HR   1 Minute HR  118     2 Minute HR  125     3 Minute HR  122     4 Minute HR  119     5 Minute HR  125     6 Minute HR  122     2 Minute Post HR  104     Interval Heart Rate?  Yes       Interval Oxygen   Interval Oxygen?  Yes     Baseline Oxygen Saturation %  96 %     1 Minute Oxygen Saturation %  93 %     1 Minute Liters of Oxygen  2 L     2 Minute Oxygen Saturation %  93 %     2 Minute Liters of Oxygen  2 L     3 Minute Oxygen Saturation %  94 %     3 Minute Liters of Oxygen  2 L     4 Minute Oxygen Saturation %  92 %     4 Minute Liters of Oxygen  2 L  5 Minute Oxygen Saturation %  91 %     5 Minute Liters of Oxygen  2 L     6 Minute Oxygen Saturation %  91 %     6 Minute Liters of Oxygen  2 L     2 Minute Post Oxygen Saturation %  95 %     2 Minute Post Liters of Oxygen  2 L        Oxygen Initial Assessment: Oxygen Initial Assessment - 12/19/17 1312      Home Oxygen   Home Oxygen Device  Home Concentrator;Portable Concentrator    Sleep Oxygen Prescription  Continuous    Liters per minute  2    Home  Exercise Oxygen Prescription  Continuous    Liters per minute  2    Home at Rest Exercise Oxygen Prescription  Continuous    Liters per minute  2    Compliance with Home Oxygen Use  Yes       Oxygen Re-Evaluation: Oxygen Re-Evaluation    Row Name 12/26/17 0904             Program Oxygen Prescription   Program Oxygen Prescription  Continuous;E-Tanks       Liters per minute  2         Home Oxygen   Home Oxygen Device  Home Concentrator;Portable Concentrator       Sleep Oxygen Prescription  Continuous       Liters per minute  2       Home Exercise Oxygen Prescription  Continuous       Home at Rest Exercise Oxygen Prescription  Continuous       Liters per minute  2       Compliance with Home Oxygen Use  Yes         Goals/Expected Outcomes   Short Term Goals  To learn and exhibit compliance with exercise, home and travel O2 prescription;To learn and understand importance of monitoring SPO2 with pulse oximeter and demonstrate accurate use of the pulse oximeter.;To learn and understand importance of maintaining oxygen saturations>88%;To learn and demonstrate proper pursed lip breathing techniques or other breathing techniques.;To learn and demonstrate proper use of respiratory medications       Long  Term Goals  Exhibits compliance with exercise, home and travel O2 prescription;Verbalizes importance of monitoring SPO2 with pulse oximeter and return demonstration;Maintenance of O2 saturations>88%;Exhibits proper breathing techniques, such as pursed lip breathing or other method taught during program session;Compliance with respiratory medication;Demonstrates proper use of MDI's       Goals/Expected Outcomes  compliance and understanding          Oxygen Discharge (Final Oxygen Re-Evaluation): Oxygen Re-Evaluation - 12/26/17 0904      Program Oxygen Prescription   Program Oxygen Prescription  Continuous;E-Tanks    Liters per minute  2      Home Oxygen   Home Oxygen Device  Home  Concentrator;Portable Concentrator    Sleep Oxygen Prescription  Continuous    Liters per minute  2    Home Exercise Oxygen Prescription  Continuous    Home at Rest Exercise Oxygen Prescription  Continuous    Liters per minute  2    Compliance with Home Oxygen Use  Yes      Goals/Expected Outcomes   Short Term Goals  To learn and exhibit compliance with exercise, home and travel O2 prescription;To learn and understand importance of monitoring SPO2 with pulse oximeter and demonstrate accurate use of  the pulse oximeter.;To learn and understand importance of maintaining oxygen saturations>88%;To learn and demonstrate proper pursed lip breathing techniques or other breathing techniques.;To learn and demonstrate proper use of respiratory medications    Long  Term Goals  Exhibits compliance with exercise, home and travel O2 prescription;Verbalizes importance of monitoring SPO2 with pulse oximeter and return demonstration;Maintenance of O2 saturations>88%;Exhibits proper breathing techniques, such as pursed lip breathing or other method taught during program session;Compliance with respiratory medication;Demonstrates proper use of MDI's    Goals/Expected Outcomes  compliance and understanding       Initial Exercise Prescription: Initial Exercise Prescription - 12/26/17 0900      Date of Initial Exercise RX and Referring Provider   Date  12/26/17    Referring Provider  Dr. Halford Chessman      Oxygen   Oxygen  Continuous    Liters  2      NuStep   Level  1    SPM  80    Minutes  17      Prescription Details   Frequency (times per week)  2    Duration  Progress to 45 minutes of aerobic exercise without signs/symptoms of physical distress      Intensity   THRR 40-80% of Max Heartrate  57-114    Ratings of Perceived Exertion  11-13    Perceived Dyspnea  0-4      Progression   Progression  Continue progressive overload as per policy without signs/symptoms or physical distress.      Resistance  Training   Training Prescription  Yes    Weight  orange bands    Reps  10-15       Perform Capillary Blood Glucose checks as needed.  Exercise Prescription Changes:   Exercise Comments:   Exercise Goals and Review:   Exercise Goals Re-Evaluation :   Discharge Exercise Prescription (Final Exercise Prescription Changes):   Nutrition:  Target Goals: Understanding of nutrition guidelines, daily intake of sodium 1500mg , cholesterol 200mg , calories 30% from fat and 7% or less from saturated fats, daily to have 5 or more servings of fruits and vegetables.  Biometrics: Pre Biometrics - 12/19/17 1315      Pre Biometrics   Grip Strength  24 kg        Nutrition Therapy Plan and Nutrition Goals:   Nutrition Assessments:   Nutrition Goals Re-Evaluation:   Nutrition Goals Discharge (Final Nutrition Goals Re-Evaluation):   Psychosocial: Target Goals: Acknowledge presence or absence of significant depression and/or stress, maximize coping skills, provide positive support system. Participant is able to verbalize types and ability to use techniques and skills needed for reducing stress and depression.  Initial Review & Psychosocial Screening: Initial Psych Review & Screening - 12/19/17 1321      Initial Review   Current issues with  None Identified      Family Dynamics   Good Support System?  Yes      Barriers   Psychosocial barriers to participate in program  There are no identifiable barriers or psychosocial needs.      Screening Interventions   Interventions  Encouraged to exercise       Quality of Life Scores:  Scores of 19 and below usually indicate a poorer quality of life in these areas.  A difference of  2-3 points is a clinically meaningful difference.  A difference of 2-3 points in the total score of the Quality of Life Index has been associated with significant improvement in overall quality  of life, self-image, physical symptoms, and general health in  studies assessing change in quality of life.   PHQ-9: Recent Review Flowsheet Data    Depression screen [facility] 2/9 12/19/2017 11/30/2017 07/26/2016   Decreased Interest 0 0 0   Down, Depressed, Hopeless 1 1 0   PHQ - 2 Score 1 1 0     Interpretation of Total Score  Total Score Depression Severity:  1-4 = Minimal depression, 5-9 = Mild depression, 10-14 = Moderate depression, 15-19 = Moderately severe depression, 20-27 = Severe depression   Psychosocial Evaluation and Intervention: Psychosocial Evaluation - 12/19/17 1323      Psychosocial Evaluation & Interventions   Interventions  Encouraged to exercise with the program and follow exercise prescription    Continue Psychosocial Services   No Follow up required       Psychosocial Re-Evaluation:   Psychosocial Discharge (Final Psychosocial Re-Evaluation):   Education: Education Goals: Education classes will be provided on a weekly basis, covering required topics. Participant will state understanding/return demonstration of topics presented.  Learning Barriers/Preferences: Learning Barriers/Preferences - 12/19/17 1306      Learning Barriers/Preferences   Learning Barriers  None    Learning Preferences  Computer/Internet       Education Topics: Risk Factor Reduction:  -Group instruction that is supported by a PowerPoint presentation. Instructor discusses the definition of a risk factor, different risk factors for pulmonary disease, and how the heart and lungs work together.     Nutrition for Pulmonary Patient:  -Group instruction provided by PowerPoint slides, verbal discussion, and written materials to support subject matter. The instructor gives an explanation and review of healthy diet recommendations, which includes a discussion on weight management, recommendations for fruit and vegetable consumption, as well as protein, fluid, caffeine, fiber, sodium, sugar, and alcohol. Tips for eating when patients are short of breath are  discussed.   Pursed Lip Breathing:  -Group instruction that is supported by demonstration and informational handouts. Instructor discusses the benefits of pursed lip and diaphragmatic breathing and detailed demonstration on how to preform both.     Oxygen Safety:  -Group instruction provided by PowerPoint, verbal discussion, and written material to support subject matter. There is an overview of "What is Oxygen" and "Why do we need it".  Instructor also reviews how to create a safe environment for oxygen use, the importance of using oxygen as prescribed, and the risks of noncompliance. There is a brief discussion on traveling with oxygen and resources the patient may utilize.   Oxygen Equipment:  -Group instruction provided by [facility] Staff utilizing handouts, written materials, and equipment demonstrations.   Signs and Symptoms:  -Group instruction provided by written material and verbal discussion to support subject matter. Warning signs and symptoms of infection, stroke, and heart attack are reviewed and when to call the physician/911 reinforced. Tips for preventing the spread of infection discussed.   Advanced Directives:  -Group instruction provided by verbal instruction and written material to support subject matter. Instructor reviews Advanced Directive laws and proper instruction for filling out document.   Pulmonary Video:  -Group video education that reviews the importance of medication and oxygen compliance, exercise, good nutrition, pulmonary hygiene, and pursed lip and diaphragmatic breathing for the pulmonary patient.   Exercise for the Pulmonary Patient:  -Group instruction that is supported by a PowerPoint presentation. Instructor discusses benefits of exercise, core components of exercise, frequency, duration, and intensity of an exercise routine, importance of utilizing pulse oximetry during exercise, safety while  exercising, and options of places to exercise outside  of rehab.     Pulmonary Medications:  -Verbally interactive group education provided by instructor with focus on inhaled medications and proper administration.   Anatomy and Physiology of the Respiratory System and Intimacy:  -Group instruction provided by PowerPoint, verbal discussion, and written material to support subject matter. Instructor reviews respiratory cycle and anatomical components of the respiratory system and their functions. Instructor also reviews differences in obstructive and restrictive respiratory diseases with examples of each. Intimacy, Sex, and Sexuality differences are reviewed with a discussion on how relationships can change when diagnosed with pulmonary disease. Common sexual concerns are reviewed.   MD DAY -A group question and answer session with a medical doctor that allows participants to ask questions that relate to their pulmonary disease state.   OTHER EDUCATION -Group or individual verbal, written, or video instructions that support the educational goals of the pulmonary rehab program.   Holiday Eating Survival Tips:  -Group instruction provided by PowerPoint slides, verbal discussion, and written materials to support subject matter. The instructor gives patients tips, tricks, and techniques to help them not only survive but enjoy the holidays despite the onslaught of food that accompanies the holidays.   Knowledge Questionnaire Score:   Core Components/Risk Factors/Patient Goals at Admission: Personal Goals and Risk Factors at Admission - 12/19/17 1317      Core Components/Risk Factors/Patient Goals on Admission    Weight Management  Yes    Intervention  Weight Management: Develop a combined nutrition and exercise program designed to reach desired caloric intake, while maintaining appropriate intake of nutrient and fiber, sodium and fats, and appropriate energy expenditure required for the weight goal.;Weight Management: Provide education and  appropriate resources to help participant work on and attain dietary goals.;Weight Management/Obesity: Establish reasonable short term and long term weight goals.;Obesity: Provide education and appropriate resources to help participant work on and attain dietary goals.    Admit Weight  257 lb 8 oz (116.8 kg)    Goal Weight: Short Term  246 lb 14.6 oz (112 kg)    Goal Weight: Long Term  242 lb 8.1 oz (110 kg)    Expected Outcomes  Short Term: Continue to assess and modify interventions until short term weight is achieved;Long Term: Adherence to nutrition and physical activity/exercise program aimed toward attainment of established weight goal;Weight Maintenance: Understanding of the daily nutrition guidelines, which includes 25-35% calories from fat, 7% or less cal from saturated fats, less than 200mg  cholesterol, less than 1.5gm of sodium, & 5 or more servings of fruits and vegetables daily;Weight Loss: Understanding of general recommendations for a balanced deficit meal plan, which promotes 1-2 lb weight loss per week and includes a negative energy balance of [phone] kcal/d;Understanding recommendations for meals to include 15-35% energy as protein, 25-35% energy from fat, 35-60% energy from carbohydrates, less than 200mg  of dietary cholesterol, 20-35 gm of total fiber daily;Understanding of distribution of calorie intake throughout the day with the consumption of 4-5 meals/snacks;Weight Gain: Understanding of general recommendations for a high calorie, high protein meal plan that promotes weight gain by distributing calorie intake throughout the day with the consumption for 4-5 meals, snacks, and/or supplements    Improve shortness of breath with ADL's  Yes    Intervention  Provide education, individualized exercise plan and daily activity instruction to help decrease symptoms of SOB with activities of daily living.    Expected Outcomes  Short Term: Improve cardiorespiratory fitness to achieve a reduction  of symptoms when performing ADLs;Long Term: Be able to perform more ADLs without symptoms or delay the onset of symptoms       Core Components/Risk Factors/Patient Goals Review:  Goals and Risk Factor Review    Row Name 12/19/17 1320             Core Components/Risk Factors/Patient Goals Review   Personal Goals Review  Weight Management/Obesity;Improve shortness of breath with ADL's;Increase knowledge of respiratory medications and ability to use respiratory devices properly.;Develop more efficient breathing techniques such as purse lipped breathing and diaphragmatic breathing and practicing self-pacing with activity.          Core Components/Risk Factors/Patient Goals at Discharge (Final Review):  Goals and Risk Factor Review - 12/19/17 1320      Core Components/Risk Factors/Patient Goals Review   Personal Goals Review  Weight Management/Obesity;Improve shortness of breath with ADL's;Increase knowledge of respiratory medications and ability to use respiratory devices properly.;Develop more efficient breathing techniques such as purse lipped breathing and diaphragmatic breathing and practicing self-pacing with activity.       ITP Comments:   Comments:

## 2017-12-27 ENCOUNTER — Encounter (HOSPITAL_COMMUNITY): Payer: Self-pay | Admitting: *Deleted

## 2017-12-27 MED ORDER — METOPROLOL SUCCINATE ER 50 MG PO TB24: 50.0000 mg | ORAL_TABLET | Freq: Every day | ORAL | 1 refills | Status: DC

## 2018-01-03 ENCOUNTER — Encounter (HOSPITAL_COMMUNITY)
Admission: RE | Admit: 2018-01-03 | Discharge: 2018-01-03 | Disposition: A | Discharge status: Home / Self Care | Payer: Medicare Other | Source: Ambulatory Visit | Attending: Pulmonary Disease | Admitting: Pulmonary Disease

## 2018-01-03 ENCOUNTER — Encounter: Payer: Self-pay | Admitting: Pulmonary Disease

## 2018-01-03 ENCOUNTER — Ambulatory Visit (INDEPENDENT_AMBULATORY_CARE_PROVIDER_SITE_OTHER): Payer: Medicare Other | Admitting: Pulmonary Disease

## 2018-01-03 VITALS — BP 120/76 | HR 95 | Ht 60.0 in | Wt 269.0 lb

## 2018-01-03 VITALS — Wt 269.2 lb

## 2018-01-03 DIAGNOSIS — J8489 Other specified interstitial pulmonary diseases: Secondary | ICD-10-CM

## 2018-01-03 DIAGNOSIS — J9611 Chronic respiratory failure with hypoxia: Secondary | ICD-10-CM | POA: Diagnosis not present

## 2018-01-03 DIAGNOSIS — Z6841 Body Mass Index (BMI) 40.0 and over, adult: Secondary | ICD-10-CM

## 2018-01-03 DIAGNOSIS — J849 Interstitial pulmonary disease, unspecified: Secondary | ICD-10-CM | POA: Diagnosis not present

## 2018-01-03 DIAGNOSIS — J479 Bronchiectasis, uncomplicated: Secondary | ICD-10-CM | POA: Diagnosis not present

## 2018-01-03 MED ORDER — PREDNISONE 20 MG PO TABS: 20.0000 mg | ORAL_TABLET | Freq: Every day | ORAL | 5 refills | Status: DC

## 2018-01-03 NOTE — Patient Instructions (Signed)
Prednisone 20 mg daily Will schedule high resolution CT chest and pulmonary function test  Follow up in 2 months

## 2018-01-03 NOTE — Progress Notes (Signed)
Daily Session Note  Patient Details  Name: Kathy [name] MRN: [id number] Date of Birth: October 24, 1940 Referring Provider:     Pulmonary Rehab Walk Test from 12/22/2017 in [place]  Referring Provider  Dr. Halford Chessman      Encounter Date: 01/03/2018  Check In: Session Check In - 01/03/18 1330      Check-In   Location  [facility]    Staff Present  Rosebud Poles, RN, BSN;Carlette Wilber Oliphant, RN, BSN;Lisa Ysidro Evert, Felipe Drone, RN, [facility]    Supervising physician immediately available to respond to emergencies  [facility] immediately available    Physician(s)  Dr. Verlon Au    Medication changes reported      No    Fall or balance concerns reported     No    Tobacco Cessation  No Change    Warm-up and Cool-down  Performed as group-led instruction    Resistance Training Performed  Yes    VAD Patient?  No    PAD/SET Patient?  No      Pain Assessment   Currently in Pain?  No/denies    Multiple Pain Sites  No       Capillary Blood Glucose: No results found for this or any previous visit (from the past 24 hour(s)).  Exercise Prescription Changes - 01/03/18 1500      Response to Exercise   Blood Pressure (Admit)  150/74    Blood Pressure (Exercise)  148/78    Blood Pressure (Exit)  148/80    Heart Rate (Admit)  88 bpm    Heart Rate (Exercise)  83 bpm    Heart Rate (Exit)  85 bpm    Oxygen Saturation (Admit)  95 %    Oxygen Saturation (Exercise)  95 %    Oxygen Saturation (Exit)  95 %    Rating of Perceived Exertion (Exercise)  13    Perceived Dyspnea (Exercise)  1    Duration  Progress to 45 minutes of aerobic exercise without signs/symptoms of physical distress    Intensity  -- 40- 80% HRR      Progression   Progression  Continue to progress workloads to maintain intensity without signs/symptoms of physical distress.      Resistance Training   Training Prescription  Yes    Weight  orange bands    Reps  10-15    Time  10  Minutes      Interval Training   Interval Training  No      Oxygen   Oxygen  Continuous    Liters  2      NuStep   Level  2    SPM  80    Minutes  51    METs  1.5       Social History   Tobacco Use  Smoking Status Former Smoker  . Packs/day: 1.00  . Years: 29.00  . Pack years: 29.00  . Types: Cigarettes  . Last attempt to quit: 06/28/1986  . Years since quitting: 31.5  Smokeless Tobacco Never Used  Tobacco Comment   quit 40+ yrs ago, 1 PPD for a few years    Goals Met:  Exercise tolerated well Strength training completed today  Goals Unmet:  Not Applicable  Comments: Service time is from 1330 to 1520.    Dr. Rush Farmer is Medical Director for Pulmonary Rehab at [facility].

## 2018-01-03 NOTE — Progress Notes (Signed)
[facility]  Chief Complaint  Patient presents with  . Follow-up    Pt has increase SOB with exertion, wheezing, productive cough-green, and chest tightness.    Constitutional: BP 120/76 (BP Location: Left Arm, Cuff Size: Normal)   Pulse 95   Ht 5' (1.524 m)   Wt 269 lb (122 kg)   SpO2 94%   BMI 52.54 kg/m   History of Present Illness: Kathy [name] is a 77 y.o. 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  She has been off prednisone for the past week.  She ran out and couldn't remember who ordered it.  Over the past week her cough and sputum have gotten worse.  She is on 2 liters oxygen 24/7.  Started pulmonary rehab today and walked 2300 steps.  Not having fever, sinus congestion, chest pain, sore throat, joint swelling, hemoptysis, abdominal pain, or skin rash.  Has ankle swelling, but better after was put back on lasix by PCP.  Reviewed her recent lab work with her.  Comprehensive Respiratory Exam:  Appearance - well kempt, wearing oxygen ENMT - nasal mucosa moist, turbinates clear, midline nasal septum, no dental lesions, no gingival bleeding, no oral exudates, no tonsillar hypertrophy Neck - no masses, trachea midline, no thyromegaly, no elevation in JVP Respiratory - normal appearance of chest wall, normal respiratory effort w/o accessory muscle use, no dullness on percussion, inspiratory squeaks more in upper lung fields CV - s1s2 regular rate and rhythm, no murmurs, 1+ peripheral edema, no varicosities, radial pulses symmetric GI - soft, non tender, no masses Lymph - no adenopathy noted in neck and axillary areas MSK - normal muscle strength and tone, normal gait Ext - no cyanosis, clubbing, or joint inflammation noted Skin - no rashes, lesions, or ulcers Neuro - oriented to person, place, and time Psych - normal mood and affect   BMP Latest Ref Rng & Units 12/12/2017 12/05/2017 11/30/2017  Glucose 70 - 99 mg/dL  107(H) 109(H) 183(H)  BUN 6 - 23 mg/dL 25(H) 19 42(H)  Creatinine 0.40 - 1.20 mg/dL 1.29(H) 1.06 1.62(H)  Sodium 135 - 145 mEq/L 139 141 136  Potassium 3.5 - 5.1 mEq/L 3.8 3.9 4.4  Chloride 96 - 112 mEq/L 98 101 96  CO2 19 - 32 mEq/L 30 32 29  Calcium 8.4 - 10.5 mg/dL 9.1 8.9 9.2    Lab Results  Component Value Date   ALT 24 11/30/2017   AST 19 11/30/2017   ALKPHOS 46 11/30/2017   BILITOT 0.6 11/30/2017     CBC Latest Ref Rng & Units 11/30/2017 07/20/2017 02/13/2017  WBC 4.0 - 10.5 K/uL 13.1(H) 10.2 8.4  Hemoglobin 12.0 - 15.0 g/dL 13.5 12.7 11.6(L)  Hematocrit 36.0 - 46.0 % 40.1 37.7 35.2(L)  Platelets 150.0 - 400.0 K/uL 314.0 283.0 214    Assessment/Plan:  ILD with NSIP pattern. 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 - continue 2 liters oxygen 24/7  Muscular deconditioning, obesity. - enrolled in pulmonary rehab   Patient Instructions  Prednisone 20 mg daily Will schedule high resolution CT chest and pulmonary function test  Follow up in 2 months    Chesley Mires, MD [place] 01/03/2018, 4:01 PM Pager:  [phone]  Flow Sheet  Pulmonary tests: HRCT chest 02/22/17 >> atherosclerosis, 3 mm LUL nodule, patchy air trapping b/l, patchy reticulation and GGO b/l, minimal traction BTX Serology 03/01/17 >> ANA negative, RF < 14, CCP < 16, ANCA negative, SSA/SSB < 0.2 PFT 03/15/17 >> FEV1 1.40 (82%), FEV1% 96, TLC 2.86 (64%), DLCO 70% HP panel 03/15/17 >> negative Aspergillus IgE Panel 07/20/17 >> negative IgE 07/20/17 >> 44  Cardiac tests: Echo 03/21/17 >> EF 65 to 70%, grade 1 DD  Past Medical History: She  has a past medical history of Arthritis, CKD (chronic kidney disease) stage 3, GFR 30-59 ml/min ([place])  (11/30/2017), Depression, Dizziness, Dysrhythmia, Endometrial ca ([place]) (11/30/2017), GERD (gastroesophageal reflux disease) (11/30/2017), HLD (hyperlipidemia) (11/30/2017), Hypercholesteremia, Hypertension, Neuropathy, Shortness of breath dyspnea, Sleep apnea, and Umbilical hernia.  Past Surgical History: She  has a past surgical history that includes Tonsillectomy; Cholecystectomy; Abdominal hysterectomy; Hernia repair; Knee arthroscopy (Bilateral); Hammer toe surgery; Brow lift (Bilateral, 07/15/2015); and Ptosis repair (Bilateral, 07/15/2015).  Family History: Her family history includes Congestive Heart Failure in her mother; Parkinson's disease in her father; Stroke in her father.  Social History: She  reports that she quit smoking about 31 years ago. Her smoking use included cigarettes. She has a 29.00 pack-year smoking history. She has never used smokeless tobacco. She reports that she does not drink alcohol or use drugs.  Medications: Allergies as of 01/03/2018      Reactions   Lipitor [atorvastatin] Other (See Comments)   Memory issues   Requip [ropinirole Hcl] Other (See Comments)   Pt reports feeling generally unwell on this medication      Medication List        Accurate as of 01/03/18  4:01 PM. Always use your most recent med list.          albuterol 108 (90 Base) MCG/ACT inhaler Commonly known as:  PROVENTIL HFA;VENTOLIN HFA Inhale 1-2 puffs into the lungs every 6 (six) hours as needed for wheezing or shortness of breath.   benzonatate 100 MG capsule Commonly known as:  TESSALON PERLES Take 1 capsule (100 mg total) by mouth 2 (two) times daily as needed for cough.   etodolac 300 MG capsule Commonly known as:  LODINE Take 300 mg by mouth 2 (two) times daily.   fenofibrate micronized 134 MG capsule Commonly known as:  LOFIBRA Take 134 mg by mouth daily before breakfast.   furosemide 40 MG tablet Commonly known as:  LASIX 1 tab by mouth in the AM, and 1 tab by mouth in the  PM as needed for persistent swelling or weight gain more than 3-5 lbs   losartan 100 MG tablet Commonly known as:  COZAAR Take 50 mg by mouth daily.   metoprolol succinate 50 MG 24 hr tablet Commonly known as:  TOPROL-XL Take 1 tablet (50 mg total) by mouth daily.   predniSONE 20 MG tablet Commonly known as:  DELTASONE Take 1 tablet (20 mg total) by mouth daily with breakfast.   venlafaxine XR 150 MG 24 hr capsule Commonly known as:  EFFEXOR-XR Take 1 capsule (150 mg total) by mouth daily.

## 2018-01-05 ENCOUNTER — Encounter (HOSPITAL_COMMUNITY)
Admission: RE | Admit: 2018-01-05 | Discharge: 2018-01-05 | Disposition: A | Discharge status: Home / Self Care | Payer: Medicare Other | Source: Ambulatory Visit | Attending: Pulmonary Disease | Admitting: Pulmonary Disease

## 2018-01-05 VITALS — Wt 266.5 lb

## 2018-01-05 DIAGNOSIS — J849 Interstitial pulmonary disease, unspecified: Secondary | ICD-10-CM | POA: Diagnosis not present

## 2018-01-05 NOTE — Progress Notes (Signed)
Daily Session Note  Patient Details  Name: Annaleigh Steinmeyer MRN: [id number] Date of Birth: [birth year] Referring Provider:     Pulmonary Rehab Walk Test from 12/22/2017 in [place]  Referring Provider  Dr. Halford Chessman      Encounter Date: 01/05/2018  Check In: Session Check In - 01/05/18 1330      Check-In   Location  [facility]    Staff Present  Rosebud Poles, RN, BSN;Carlette Wilber Oliphant, RN, BSN;Lisa Ysidro Evert, Felipe Drone, RN, [facility]    Supervising physician immediately available to respond to emergencies  [facility] immediately available    Physician(s)  Dr. Broadus John    Medication changes reported      No    Fall or balance concerns reported     No    Tobacco Cessation  No Change    Warm-up and Cool-down  Performed on first and last piece of equipment    Resistance Training Performed  Yes    VAD Patient?  No    PAD/SET Patient?  No      Pain Assessment   Currently in Pain?  No/denies    Multiple Pain Sites  No       Capillary Blood Glucose: No results found for this or any previous visit (from the past 24 hour(s)).    Social History   Tobacco Use  Smoking Status Former Smoker  . Packs/day: 1.00  . Years: 29.00  . Pack years: 29.00  . Types: Cigarettes  . Last attempt to quit: 06/28/1986  . Years since quitting: 31.5  Smokeless Tobacco Never Used  Tobacco Comment   quit 40+ yrs ago, 1 PPD for a few years    Goals Met:  Exercise tolerated well Strength training completed today  Goals Unmet:  Not Applicable  Comments: Service time is from 1330 to 1525    Dr. Rush Farmer is Medical Director for Pulmonary Rehab at [facility].

## 2018-01-06 NOTE — Progress Notes (Signed)
Kathy [name] 77 y.o. female   DOB: 1941/05/15 MRN: [id number]          Nutrition 1. Interstitial lung disease ([place])    Past Medical History:  Diagnosis Date  . Arthritis    fingers  . CKD (chronic kidney disease) stage 3, GFR 30-59 ml/min ([facility]) 11/30/2017  . Depression   . Dizziness    in AM, getting out of bed  . Dysrhythmia    "skips a beat" sometimes - followed by PCP  . Endometrial ca ([place]) 11/30/2017   S/p surgury 1990's  . GERD (gastroesophageal reflux disease) 11/30/2017  . HLD (hyperlipidemia) 11/30/2017  . Hypercholesteremia   . Hypertension   . Neuropathy    bilateral feet  . Shortness of breath dyspnea   . Sleep apnea    has CPAP, doesn't use  . Umbilical hernia    Meds reviewed. Toprol, lasix, prednisone noted  Ht: Ht Readings from Last 1 Encounters:  01/03/18 5' (1.524 m)     Wt:  Wt Readings from Last 3 Encounters:  01/05/18 266 lb 8.6 oz (120.9 kg)  01/03/18 269 lb 2.9 oz (122.1 kg)  01/03/18 269 lb (122 kg)     BMI: Body mass index is 52.05 kg/m.     Current tobacco use?  No  Labs:  Lipid Panel     Component Value Date/Time   CHOL 223 (H) 11/30/2017 1610   TRIG 106.0 11/30/2017 1610   HDL 74.40 11/30/2017 1610   CHOLHDL 3 11/30/2017 1610   VLDL 21.2 11/30/2017 1610   LDLCALC 127 (H) 11/30/2017 1610    No results found for: HGBA1C Note Spoke with pt. Pt is obese. Pt is interested in weight loss. Pt is not making any current changes to aide in weight loss at this time. Weight loss tips reviewed. Pt eats 3 meals a day; most not prepared at home. Pt shared she has no interest in cooking. Discussed that it is easier to control sodium and saturated fat intake when at home compared to when eating out. Discussed reading labels on convenience foods to help increase awareness of amount of sodium in foods. Distributed label reading handout.  Pt's Rate Your Plate results reviewed with pt. Pt does not avoid salty food; uses canned/ convenience food.  Pt adds salt  to food.  The role of sodium in lung disease reviewed with pt. Pt expressed understanding of the information reviewed.   Nutrition Diagnosis ? Excessive sodium intake related to over consumption of processed food as evidenced by frequent consumption of convenience food/ canned vegetables and eating out frequently. ? Food-and nutrition-related knowledge deficit related to lack of exposure to information as related to diagnosis of pulmonary disease ? Overweight/obesity related to excessive energy intake as evidenced by a BMI of Body mass index is 52.05 kg/m.   Nutrition Intervention ? Pt's individual nutrition plan and goals reviewed with pt. ? Benefits of adopting healthy eating habits discussed when pt's Rate Your Plate reviewed.  Goal(s) 1. Pt to identify and limit food sources of sodium. 2. Identify food quantities necessary to achieve wt loss of  -2# per week to a goal wt loss of 6-24 lb at graduation from pulmonary rehab.   Plan:  Pt to attend Pulmonary Nutrition class Will provide client-centered nutrition education as part of interdisciplinary care.   Monitor and evaluate progress toward nutrition goal with team.  Monitor and Evaluate progress toward nutrition goal with team.   Laurina Bustle, MS, RD, LDN 01/06/2018 8:28 AM

## 2018-01-10 ENCOUNTER — Telehealth (HOSPITAL_COMMUNITY): Payer: Self-pay | Admitting: Internal Medicine

## 2018-01-10 ENCOUNTER — Encounter (HOSPITAL_COMMUNITY): Payer: Medicare Other

## 2018-01-11 ENCOUNTER — Ambulatory Visit
Admission: RE | Admit: 2018-01-11 | Discharge: 2018-01-11 | Disposition: A | Discharge status: Home / Self Care | Payer: Medicare Other | Source: Ambulatory Visit | Attending: Pulmonary Disease | Admitting: Pulmonary Disease

## 2018-01-11 DIAGNOSIS — I2510 Atherosclerotic heart disease of native coronary artery without angina pectoris: Secondary | ICD-10-CM | POA: Diagnosis not present

## 2018-01-11 DIAGNOSIS — J849 Interstitial pulmonary disease, unspecified: Secondary | ICD-10-CM | POA: Insufficient documentation

## 2018-01-11 DIAGNOSIS — Z9049 Acquired absence of other specified parts of digestive tract: Secondary | ICD-10-CM | POA: Insufficient documentation

## 2018-01-11 DIAGNOSIS — I700 Atherosclerosis of aorta: Secondary | ICD-10-CM | POA: Insufficient documentation

## 2018-01-12 ENCOUNTER — Encounter (HOSPITAL_COMMUNITY)
Admission: RE | Admit: 2018-01-12 | Discharge: 2018-01-12 | Disposition: A | Discharge status: Home / Self Care | Payer: Medicare Other | Source: Ambulatory Visit | Attending: Pulmonary Disease | Admitting: Pulmonary Disease

## 2018-01-12 VITALS — Wt 270.3 lb

## 2018-01-12 DIAGNOSIS — J849 Interstitial pulmonary disease, unspecified: Secondary | ICD-10-CM

## 2018-01-12 NOTE — Progress Notes (Signed)
Daily Session Note  Patient Details  Name: Kathy [name] MRN: [id number] Date of Birth: 1941-05-07 Referring Provider:     Pulmonary Rehab Walk Test from 12/22/2017 in [place]  Referring Provider  Dr. Halford Chessman      Encounter Date: 01/12/2018  Check In: Session Check In - 01/12/18 1330      Check-In   Location  [facility]    Staff Present  Rosebud Poles, RN, BSN;Carlette Carlton, RN, BSN;Molly DiVincenzo, MS, ACSM RCEP, Exercise Physiologist;Lisa Ysidro Evert, Felipe Drone, RN, [facility]    Supervising physician immediately available to respond to emergencies  [facility] immediately available    Physician(s)  Dr. Denton Brick    Medication changes reported      No    Fall or balance concerns reported     No    Tobacco Cessation  No Change    Warm-up and Cool-down  Performed as group-led instruction    Resistance Training Performed  Yes    VAD Patient?  No      Pain Assessment   Currently in Pain?  No/denies    Multiple Pain Sites  No       Capillary Blood Glucose: No results found for this or any previous visit (from the past 24 hour(s)).    Social History   Tobacco Use  Smoking Status Former Smoker  . Packs/day: 1.00  . Years: 29.00  . Pack years: 29.00  . Types: Cigarettes  . Last attempt to quit: 06/28/1986  . Years since quitting: 31.5  Smokeless Tobacco Never Used  Tobacco Comment   quit 40+ yrs ago, 1 PPD for a few years    Goals Met:  Exercise tolerated well Strength training completed today  Goals Unmet:  Not Applicable  Comments: Service time is from 1330 to 1530    Dr. Rush Farmer is Medical Director for Pulmonary Rehab at [facility].

## 2018-01-17 ENCOUNTER — Encounter (HOSPITAL_COMMUNITY)
Admission: RE | Admit: 2018-01-17 | Discharge: 2018-01-17 | Disposition: A | Discharge status: Home / Self Care | Payer: Medicare Other | Source: Ambulatory Visit | Attending: Pulmonary Disease | Admitting: Pulmonary Disease

## 2018-01-17 VITALS — Wt 265.2 lb

## 2018-01-17 DIAGNOSIS — J849 Interstitial pulmonary disease, unspecified: Secondary | ICD-10-CM

## 2018-01-17 NOTE — Progress Notes (Signed)
Daily Session Note  Patient Details  Name: Kathy [name] MRN: [id number] Date of Birth: 06-21-1941 Referring Provider:     Pulmonary Rehab Walk Test from 12/22/2017 in [place]  Referring Provider  Dr. Halford Chessman      Encounter Date: 01/17/2018  Check In: Session Check In - 01/17/18 1330      Check-In   Location  [facility]    Staff Present  Rosebud Poles, RN, BSN;Carlette Carlton, RN, BSN;Molly DiVincenzo, MS, ACSM RCEP, Exercise Physiologist;Lisa Ysidro Evert, Felipe Drone, RN, [facility]    Supervising physician immediately available to respond to emergencies  [facility] immediately available    Physician(s)  Dr. Denton Brick    Medication changes reported      No    Fall or balance concerns reported     No    Tobacco Cessation  No Change    Warm-up and Cool-down  Performed as group-led instruction    Resistance Training Performed  Yes    VAD Patient?  No    PAD/SET Patient?  No      Pain Assessment   Currently in Pain?  No/denies    Multiple Pain Sites  No       Capillary Blood Glucose: No results found for this or any previous visit (from the past 24 hour(s)).  Exercise Prescription Changes - 01/17/18 1500      Response to Exercise   Blood Pressure (Admit)  133/90    Blood Pressure (Exercise)  144/80    Blood Pressure (Exit)  130/66    Heart Rate (Admit)  84 bpm    Heart Rate (Exercise)  78 bpm    Heart Rate (Exit)  65 bpm    Oxygen Saturation (Admit)  97 %    Oxygen Saturation (Exercise)  94 %    Oxygen Saturation (Exit)  95 %    Rating of Perceived Exertion (Exercise)  12    Perceived Dyspnea (Exercise)  1    Duration  Progress to 45 minutes of aerobic exercise without signs/symptoms of physical distress    Intensity  THRR unchanged      Resistance Training   Training Prescription  Yes    Weight  orange bands    Reps  10-15    Time  10 Minutes      Interval Training   Interval Training  No      Oxygen   Oxygen  Continuous    Liters  2      NuStep   Level  1    SPM  80    Minutes  51    METs  1.6       Social History   Tobacco Use  Smoking Status Former Smoker  . Packs/day: 1.00  . Years: 29.00  . Pack years: 29.00  . Types: Cigarettes  . Last attempt to quit: 06/28/1986  . Years since quitting: 31.5  Smokeless Tobacco Never Used  Tobacco Comment   quit 40+ yrs ago, 1 PPD for a few years    Goals Met:  Exercise tolerated well Strength training completed today  Goals Unmet:  Not Applicable  Comments: Service time is from 1330 to 1500.    Dr. Rush Farmer is Medical Director for Pulmonary Rehab at [facility].

## 2018-01-17 NOTE — Progress Notes (Signed)
Pulmonary Individual Treatment Plan  Patient Details  Name: Kathy [name] MRN: [id number] Date of Birth: 02-19-1941 Referring Provider:     Pulmonary Rehab Walk Test from 12/22/2017 in [place]  Referring Provider  Dr. Halford Chessman      Initial Encounter Date:    Pulmonary Rehab Walk Test from 12/22/2017 in [place]  Date  12/26/17      Visit Diagnosis: Interstitial lung disease ([place])  Patient's Home Medications on Admission:   Current Outpatient Medications:  .  albuterol (PROVENTIL HFA;VENTOLIN HFA) 108 (90 Base) MCG/ACT inhaler, Inhale 1-2 puffs into the lungs every 6 (six) hours as needed for wheezing or shortness of breath., Disp: 1 Inhaler, Rfl: 2 .  benzonatate (TESSALON PERLES) 100 MG capsule, Take 1 capsule (100 mg total) by mouth 2 (two) times daily as needed for cough., Disp: 60 capsule, Rfl: 5 .  etodolac (LODINE) 300 MG capsule, Take 300 mg by mouth 2 (two) times daily., Disp: , Rfl:  .  fenofibrate micronized (LOFIBRA) 134 MG capsule, Take 134 mg by mouth daily before breakfast., Disp: , Rfl:  .  furosemide (LASIX) 40 MG tablet, 1 tab by mouth in the AM, and 1 tab by mouth in the PM as needed for persistent swelling or weight gain more than 3-5 lbs, Disp: 60 tablet, Rfl: 11 .  losartan (COZAAR) 100 MG tablet, Take 50 mg by mouth daily., Disp: , Rfl:  .  metoprolol succinate (TOPROL-XL) 50 MG 24 hr tablet, Take 1 tablet (50 mg total) by mouth daily., Disp: 90 tablet, Rfl: 1 .  predniSONE (DELTASONE) 20 MG tablet, Take 1 tablet (20 mg total) by mouth daily with breakfast., Disp: 30 tablet, Rfl: 5 .  venlafaxine XR (EFFEXOR-XR) 150 MG 24 hr capsule, Take 1 capsule (150 mg total) by mouth daily., Disp: 90 capsule, Rfl: 3  Past Medical History: Past Medical History:  Diagnosis Date  . Arthritis    fingers  . CKD (chronic kidney disease) stage 3, GFR 30-59 ml/min (HCC) 11/30/2017  . Depression   . Dizziness    in AM,  getting out of bed  . Dysrhythmia    "skips a beat" sometimes - followed by PCP  . Endometrial ca ([place]) 11/30/2017   S/p surgury 1990's  . GERD (gastroesophageal reflux disease) 11/30/2017  . HLD (hyperlipidemia) 11/30/2017  . Hypercholesteremia   . Hypertension   . Neuropathy    bilateral feet  . Shortness of breath dyspnea   . Sleep apnea    has CPAP, doesn't use  . Umbilical hernia     Tobacco Use: Social History   Tobacco Use  Smoking Status Former Smoker  . Packs/day: 1.00  . Years: 29.00  . Pack years: 29.00  . Types: Cigarettes  . Last attempt to quit: 06/28/1986  . Years since quitting: 31.5  Smokeless Tobacco Never Used  Tobacco Comment   quit 40+ yrs ago, 1 PPD for a few years    Labs: Recent Review Flowsheet Data    Labs for ITP Cardiac and Pulmonary Rehab Latest Ref Rng & Units 05/13/2008 07/31/2014 06/27/2016 11/30/2017   Cholestrol 0 - 200 mg/dL - - - 223(H)   LDLCALC 0 - 99 mg/dL - - - 127(H)   HDL >39.00 mg/dL - - - 74.40   Trlycerides 0.0 - 149.0 mg/dL - - - 106.0   TCO2 0 - 100 mmol/L '28 25 29 ' -      Capillary Blood Glucose:  Lab Results  Component Value Date   GLUCAP 134 (H) 06/29/2016     Pulmonary Assessment Scores: Pulmonary Assessment Scores    Row Name 12/26/17 0905 12/27/17 1634 01/05/18 1543     ADL UCSD   ADL Phase  Entry  Entry  -   SOB Score total  -  80  -     CAT Score   CAT Score  -  -  29     mMRC Score   mMRC Score  4  -  -      Pulmonary Function Assessment: Pulmonary Function Assessment - 12/19/17 1307      Breath   Bilateral Breath Sounds  Clear    Shortness of Breath  Limiting activity;Yes       Exercise Target Goals:    Exercise Program Goal: Individual exercise prescription set using results from initial 6 min walk test and THRR while considering  patient's activity barriers and safety.    Exercise Prescription Goal: Initial exercise prescription builds to 30-45 minutes a day of aerobic activity, 2-3 days  per week.  Home exercise guidelines will be given to patient during program as part of exercise prescription that the participant will acknowledge.  Activity Barriers & Risk Stratification: Activity Barriers & Cardiac Risk Stratification - 12/19/17 1313      Activity Barriers & Cardiac Risk Stratification   Activity Barriers  Back Problems;Shortness of Breath;Joint Problems;Arthritis       6 Minute Walk: 6 Minute Walk    Row Name 12/26/17 0905         6 Minute Walk   Phase  Initial     Distance  500 feet     Walk Time  - 4 minutes and 9 seconds     # of Rest Breaks  - 1 minute 51 seconds in total     MPH  0.94     METS  1.77     RPE  14     Perceived Dyspnea   3     Symptoms  Yes (comment)     Comments  7/10 lower back pain     Resting HR  97 bpm     Resting BP  100/52     Resting Oxygen Saturation   96 %     Exercise Oxygen Saturation  during 6 min walk  91 %     Max Ex. HR  125 bpm     Max Ex. BP  138/48       Interval HR   1 Minute HR  118     2 Minute HR  125     3 Minute HR  122     4 Minute HR  119     5 Minute HR  125     6 Minute HR  122     2 Minute Post HR  104     Interval Heart Rate?  Yes       Interval Oxygen   Interval Oxygen?  Yes     Baseline Oxygen Saturation %  96 %     1 Minute Oxygen Saturation %  93 %     1 Minute Liters of Oxygen  2 L     2 Minute Oxygen Saturation %  93 %     2 Minute Liters of Oxygen  2 L     3 Minute Oxygen Saturation %  94 %     3 Minute Liters of Oxygen  2 L     4 Minute Oxygen Saturation %  92 %     4 Minute Liters of Oxygen  2 L     5 Minute Oxygen Saturation %  91 %     5 Minute Liters of Oxygen  2 L     6 Minute Oxygen Saturation %  91 %     6 Minute Liters of Oxygen  2 L     2 Minute Post Oxygen Saturation %  95 %     2 Minute Post Liters of Oxygen  2 L        Oxygen Initial Assessment: Oxygen Initial Assessment - 12/19/17 1312      Home Oxygen   Home Oxygen Device  Home Concentrator;Portable  Concentrator    Sleep Oxygen Prescription  Continuous    Liters per minute  2    Home Exercise Oxygen Prescription  Continuous    Liters per minute  2    Home at Rest Exercise Oxygen Prescription  Continuous    Liters per minute  2    Compliance with Home Oxygen Use  Yes       Oxygen Re-Evaluation: Oxygen Re-Evaluation    Row Name 12/26/17 0904 01/16/18 1230           Program Oxygen Prescription   Program Oxygen Prescription  Continuous;E-Tanks  Continuous;E-Tanks      Liters per minute  2  2        Home Oxygen   Home Oxygen Device  Home Concentrator;Portable Concentrator  Home Concentrator;Portable Concentrator      Sleep Oxygen Prescription  Continuous  Continuous      Liters per minute  2  2      Home Exercise Oxygen Prescription  Continuous  Continuous      Liters per minute  -  2      Home at Rest Exercise Oxygen Prescription  Continuous  Continuous      Liters per minute  2  2      Compliance with Home Oxygen Use  Yes  Yes        Goals/Expected Outcomes   Short Term Goals  To learn and exhibit compliance with exercise, home and travel O2 prescription;To learn and understand importance of monitoring SPO2 with pulse oximeter and demonstrate accurate use of the pulse oximeter.;To learn and understand importance of maintaining oxygen saturations>88%;To learn and demonstrate proper pursed lip breathing techniques or other breathing techniques.;To learn and demonstrate proper use of respiratory medications  To learn and exhibit compliance with exercise, home and travel O2 prescription;To learn and understand importance of monitoring SPO2 with pulse oximeter and demonstrate accurate use of the pulse oximeter.;To learn and understand importance of maintaining oxygen saturations>88%;To learn and demonstrate proper pursed lip breathing techniques or other breathing techniques.;To learn and demonstrate proper use of respiratory medications      Long  Term Goals  Exhibits compliance  with exercise, home and travel O2 prescription;Verbalizes importance of monitoring SPO2 with pulse oximeter and return demonstration;Maintenance of O2 saturations>88%;Exhibits proper breathing techniques, such as pursed lip breathing or other method taught during program session;Compliance with respiratory medication;Demonstrates proper use of MDI's  Exhibits compliance with exercise, home and travel O2 prescription;Verbalizes importance of monitoring SPO2 with pulse oximeter and return demonstration;Maintenance of O2 saturations>88%;Exhibits proper breathing techniques, such as pursed lip breathing or other method taught during program session;Compliance with respiratory medication;Demonstrates proper use of MDI's      Goals/Expected Outcomes  compliance and understanding  compliance and understanding         Oxygen Discharge (Final Oxygen Re-Evaluation): Oxygen Re-Evaluation - 01/16/18 1230      Program Oxygen Prescription   Program Oxygen Prescription  Continuous;E-Tanks    Liters per minute  2      Home Oxygen   Home Oxygen Device  Home Concentrator;Portable Concentrator    Sleep Oxygen Prescription  Continuous    Liters per minute  2    Home Exercise Oxygen Prescription  Continuous    Liters per minute  2    Home at Rest Exercise Oxygen Prescription  Continuous    Liters per minute  2    Compliance with Home Oxygen Use  Yes      Goals/Expected Outcomes   Short Term Goals  To learn and exhibit compliance with exercise, home and travel O2 prescription;To learn and understand importance of monitoring SPO2 with pulse oximeter and demonstrate accurate use of the pulse oximeter.;To learn and understand importance of maintaining oxygen saturations>88%;To learn and demonstrate proper pursed lip breathing techniques or other breathing techniques.;To learn and demonstrate proper use of respiratory medications    Long  Term Goals  Exhibits compliance with exercise, home and travel O2  prescription;Verbalizes importance of monitoring SPO2 with pulse oximeter and return demonstration;Maintenance of O2 saturations>88%;Exhibits proper breathing techniques, such as pursed lip breathing or other method taught during program session;Compliance with respiratory medication;Demonstrates proper use of MDI's    Goals/Expected Outcomes  compliance and understanding       Initial Exercise Prescription: Initial Exercise Prescription - 12/26/17 0900      Date of Initial Exercise RX and Referring Provider   Date  12/26/17    Referring Provider  Dr. Halford Chessman      Oxygen   Oxygen  Continuous    Liters  2      NuStep   Level  1    SPM  80    Minutes  17      Prescription Details   Frequency (times per week)  2    Duration  Progress to 45 minutes of aerobic exercise without signs/symptoms of physical distress      Intensity   THRR 40-80% of Max Heartrate  57-114    Ratings of Perceived Exertion  11-13    Perceived Dyspnea  0-4      Progression   Progression  Continue progressive overload as per policy without signs/symptoms or physical distress.      Resistance Training   Training Prescription  Yes    Weight  orange bands    Reps  10-15       Perform Capillary Blood Glucose checks as needed.  Exercise Prescription Changes:  Exercise Prescription Changes    Row Name 01/03/18 1500 01/17/18 1500           Response to Exercise   Blood Pressure (Admit)  150/74  133/90      Blood Pressure (Exercise)  148/78  144/80      Blood Pressure (Exit)  148/80  130/66      Heart Rate (Admit)  88 bpm  84 bpm      Heart Rate (Exercise)  83 bpm  78 bpm      Heart Rate (Exit)  85 bpm  65 bpm      Oxygen Saturation (Admit)  95 %  97 %      Oxygen Saturation (Exercise)  95 %  94 %  Oxygen Saturation (Exit)  95 %  95 %      Rating of Perceived Exertion (Exercise)  13  12      Perceived Dyspnea (Exercise)  1  1      Duration  Progress to 45 minutes of aerobic exercise without  signs/symptoms of physical distress  Progress to 45 minutes of aerobic exercise without signs/symptoms of physical distress      Intensity  - 40- 80% HRR  THRR unchanged        Progression   Progression  Continue to progress workloads to maintain intensity without signs/symptoms of physical distress.  -        Resistance Training   Training Prescription  Yes  Yes      Weight  orange bands  orange bands      Reps  10-15  10-15      Time  10 Minutes  10 Minutes        Interval Training   Interval Training  No  No        Oxygen   Oxygen  Continuous  Continuous      Liters  2  2        NuStep   Level  2  1      SPM  80  80      Minutes  51  51      METs  1.5  1.6         Exercise Comments:   Exercise Goals and Review:   Exercise Goals Re-Evaluation : Exercise Goals Re-Evaluation    Row Name 01/16/18 1231             Exercise Goal Re-Evaluation   Exercise Goals Review  Increase Physical Activity;Able to understand and use rate of perceived exertion (RPE) scale;Knowledge and understanding of Target Heart Rate Range (THRR);Understanding of Exercise Prescription;Increase Strength and Stamina;Able to understand and use Dyspnea scale       Comments  Patient has only attended three rehab sessions. Will cont. to monitor and progress as able.        Expected Outcomes  Through rehab and exercising at home, patient will find it easier to carry out ADL's and will gain the confidence to establish an exercise routine at home.           Discharge Exercise Prescription (Final Exercise Prescription Changes): Exercise Prescription Changes - 01/17/18 1500      Response to Exercise   Blood Pressure (Admit)  133/90    Blood Pressure (Exercise)  144/80    Blood Pressure (Exit)  130/66    Heart Rate (Admit)  84 bpm    Heart Rate (Exercise)  78 bpm    Heart Rate (Exit)  65 bpm    Oxygen Saturation (Admit)  97 %    Oxygen Saturation (Exercise)  94 %    Oxygen Saturation (Exit)  95 %     Rating of Perceived Exertion (Exercise)  12    Perceived Dyspnea (Exercise)  1    Duration  Progress to 45 minutes of aerobic exercise without signs/symptoms of physical distress    Intensity  THRR unchanged      Resistance Training   Training Prescription  Yes    Weight  orange bands    Reps  10-15    Time  10 Minutes      Interval Training   Interval Training  No      Oxygen   Oxygen  Continuous  Liters  2      NuStep   Level  1    SPM  80    Minutes  51    METs  1.6       Nutrition:  Target Goals: Understanding of nutrition guidelines, daily intake of sodium <1575m, cholesterol <2018m calories 30% from fat and 7% or less from saturated fats, daily to have 5 or more servings of fruits and vegetables.  Biometrics: Pre Biometrics - 12/19/17 1315      Pre Biometrics   Grip Strength  24 kg        Nutrition Therapy Plan and Nutrition Goals: Nutrition Therapy & Goals - 01/06/18 0833      Nutrition Therapy   Protein (specify units)  general, healthful      Personal Nutrition Goals   Nutrition Goal  Identify food quantities necessary to achieve wt loss of  -2# per week to a goal wt loss of 6-24 lb at graduation from pulmonary rehab.    Personal Goal #2  Pt to identify and limit food sources of sodium.      Intervention Plan   Intervention  Prescribe, educate and counsel regarding individualized specific dietary modifications aiming towards targeted core components such as weight, hypertension, lipid management, diabetes, heart failure and other comorbidities.    Expected Outcomes  Short Term Goal: Understand basic principles of dietary content, such as calories, fat, sodium, cholesterol and nutrients.       Nutrition Assessments: Nutrition Assessments - 12/28/17 1234      Rate Your Plate Scores   Pre Score  55       Nutrition Goals Re-Evaluation: Nutrition Goals Re-Evaluation    [place] 01/06/18 0834             Goals   Nutrition Goal   Identify food quantities necessary to achieve wt loss of  -2# per week to a goal wt loss of 6-24 lb at graduation from pulmonary rehab.          Nutrition Goals Discharge (Final Nutrition Goals Re-Evaluation): Nutrition Goals Re-Evaluation - 01/06/18 0834      Goals   Nutrition Goal  Identify food quantities necessary to achieve wt loss of  -2# per week to a goal wt loss of 6-24 lb at graduation from pulmonary rehab.       Psychosocial: Target Goals: Acknowledge presence or absence of significant depression and/or stress, maximize coping skills, provide positive support system. Participant is able to verbalize types and ability to use techniques and skills needed for reducing stress and depression.  Initial Review & Psychosocial Screening: Initial Psych Review & Screening - 12/19/17 1321      Initial Review   Current issues with  None Identified      Family Dynamics   Good Support System?  Yes      Barriers   Psychosocial barriers to participate in program  There are no identifiable barriers or psychosocial needs.      Screening Interventions   Interventions  Encouraged to exercise       Quality of Life Scores:  Scores of 19 and below usually indicate a poorer quality of life in these areas.  A difference of  2-3 points is a clinically meaningful difference.  A difference of 2-3 points in the total score of the Quality of Life Index has been associated with significant improvement in overall quality of life, self-image, physical symptoms, and general health in studies assessing change in quality of life.  PHQ-9: Recent Review Flowsheet Data    Depression screen [facility] 2/9 12/19/2017 11/30/2017 07/26/2016   Decreased Interest 0 0 0   Down, Depressed, Hopeless 1 1 0   PHQ - 2 Score 1 1 0     Interpretation of Total Score  Total Score Depression Severity:  1-4 = Minimal depression, 5-9 = Mild depression, 10-14 = Moderate depression, 15-19 = Moderately severe depression, 20-27  = Severe depression   Psychosocial Evaluation and Intervention: Psychosocial Evaluation - 01/17/18 1035      Psychosocial Evaluation & Interventions   Comments  No indentifiable barriers to participating in pulmonary rehab     Expected Outcomes  Pt will continue to display positive and healthy coping skill with positive outlook on her future.       Psychosocial Re-Evaluation: Psychosocial Re-Evaluation    [place] Name 01/17/18 1036             Psychosocial Re-Evaluation   Current issues with  None Identified       Comments  pt display pleasant and positive outlook.  Pt interacts well with staff and fellow participants       Expected Outcomes  Pt will contine to be free from any psychosocial barriers.       Interventions  Encouraged to attend Pulmonary Rehabilitation for the exercise;Stress management education;Relaxation education       Continue Psychosocial Services   Follow up required by staff          Psychosocial Discharge (Final Psychosocial Re-Evaluation): Psychosocial Re-Evaluation - 01/17/18 1036      Psychosocial Re-Evaluation   Current issues with  None Identified    Comments  pt display pleasant and positive outlook.  Pt interacts well with staff and fellow participants    Expected Outcomes  Pt will contine to be free from any psychosocial barriers.    Interventions  Encouraged to attend Pulmonary Rehabilitation for the exercise;Stress management education;Relaxation education    Continue Psychosocial Services   Follow up required by staff       Education: Education Goals: Education classes will be provided on a weekly basis, covering required topics. Participant will state understanding/return demonstration of topics presented.  Learning Barriers/Preferences: Learning Barriers/Preferences - 12/19/17 1306      Learning Barriers/Preferences   Learning Barriers  None    Learning Preferences  Computer/Internet       Education Topics: Risk Factor Reduction:   -Group instruction that is supported by a PowerPoint presentation. Instructor discusses the definition of a risk factor, different risk factors for pulmonary disease, and how the heart and lungs work together.     Nutrition for Pulmonary Patient:  -Group instruction provided by PowerPoint slides, verbal discussion, and written materials to support subject matter. The instructor gives an explanation and review of healthy diet recommendations, which includes a discussion on weight management, recommendations for fruit and vegetable consumption, as well as protein, fluid, caffeine, fiber, sodium, sugar, and alcohol. Tips for eating when patients are short of breath are discussed.   Pursed Lip Breathing:  -Group instruction that is supported by demonstration and informational handouts. Instructor discusses the benefits of pursed lip and diaphragmatic breathing and detailed demonstration on how to preform both.     Oxygen Safety:  -Group instruction provided by PowerPoint, verbal discussion, and written material to support subject matter. There is an overview of "What is Oxygen" and "Why do we need it".  Instructor also reviews how to create a safe environment for oxygen use, the importance  of using oxygen as prescribed, and the risks of noncompliance. There is a brief discussion on traveling with oxygen and resources the patient may utilize.   PULMONARY REHAB OTHER RESPIRATORY from 01/12/2018 in [place]  Date  01/12/18  Educator  Cloyde Reams  Instruction Review Code  1- Verbalizes Understanding      Oxygen Equipment:  -Group instruction provided by [employer] utilizing handouts, written materials, and Insurance underwriter.   Signs and Symptoms:  -Group instruction provided by written material and verbal discussion to support subject matter. Warning signs and symptoms of infection, stroke, and heart attack are reviewed and when to call the physician/911  reinforced. Tips for preventing the spread of infection discussed.   PULMONARY REHAB OTHER RESPIRATORY from 01/12/2018 in [facility]  Date  01/05/18  Educator  Remo Lipps  Instruction Review Code  1- Verbalizes Understanding      Advanced Directives:  -Group instruction provided by verbal instruction and written material to support subject matter. Instructor reviews Advanced Directive laws and proper instruction for filling out document.   Pulmonary Video:  -Group video education that reviews the importance of medication and oxygen compliance, exercise, good nutrition, pulmonary hygiene, and pursed lip and diaphragmatic breathing for the pulmonary patient.   Exercise for the Pulmonary Patient:  -Group instruction that is supported by a PowerPoint presentation. Instructor discusses benefits of exercise, core components of exercise, frequency, duration, and intensity of an exercise routine, importance of utilizing pulse oximetry during exercise, safety while exercising, and options of places to exercise outside of rehab.     Pulmonary Medications:  -Verbally interactive group education provided by instructor with focus on inhaled medications and proper administration.   Anatomy and Physiology of the Respiratory System and Intimacy:  -Group instruction provided by PowerPoint, verbal discussion, and written material to support subject matter. Instructor reviews respiratory cycle and anatomical components of the respiratory system and their functions. Instructor also reviews differences in obstructive and restrictive respiratory diseases with examples of each. Intimacy, Sex, and Sexuality differences are reviewed with a discussion on how relationships can change when diagnosed with pulmonary disease. Common sexual concerns are reviewed.   MD DAY -A group question and answer session with a medical doctor that allows participants to ask questions that relate to their  pulmonary disease state.   OTHER EDUCATION -Group or individual verbal, written, or video instructions that support the educational goals of the pulmonary rehab program.   Holiday Eating Survival Tips:  -Group instruction provided by PowerPoint slides, verbal discussion, and written materials to support subject matter. The instructor gives patients tips, tricks, and techniques to help them not only survive but enjoy the holidays despite the onslaught of food that accompanies the holidays.   Knowledge Questionnaire Score: Knowledge Questionnaire Score - 12/27/17 1634      Knowledge Questionnaire Score   Pre Score  15/18       Core Components/Risk Factors/Patient Goals at Admission: Personal Goals and Risk Factors at Admission - 12/19/17 1317      Core Components/Risk Factors/Patient Goals on Admission    Weight Management  Yes    Intervention  Weight Management: Develop a combined nutrition and exercise program designed to reach desired caloric intake, while maintaining appropriate intake of nutrient and fiber, sodium and fats, and appropriate energy expenditure required for the weight goal.;Weight Management: Provide education and appropriate resources to help participant work on and attain dietary goals.;Weight Management/Obesity: Establish reasonable short term and  long term weight goals.;Obesity: Provide education and appropriate resources to help participant work on and attain dietary goals.    Admit Weight  257 lb 8 oz (116.8 kg)    Goal Weight: Short Term  246 lb 14.6 oz (112 kg)    Goal Weight: Long Term  242 lb 8.1 oz (110 kg)    Expected Outcomes  Short Term: Continue to assess and modify interventions until short term weight is achieved;Long Term: Adherence to nutrition and physical activity/exercise program aimed toward attainment of established weight goal;Weight Maintenance: Understanding of the daily nutrition guidelines, which includes 25-35% calories from fat, 7% or less  cal from saturated fats, less than 236m cholesterol, less than 1.5gm of sodium, & 5 or more servings of fruits and vegetables daily;Weight Loss: Understanding of general recommendations for a balanced deficit meal plan, which promotes 1-2 lb weight loss per week and includes a negative energy balance of [id number] kcal/d;Understanding recommendations for meals to include 15-35% energy as protein, 25-35% energy from fat, 35-60% energy from carbohydrates, less than 2036mof dietary cholesterol, 20-35 gm of total fiber daily;Understanding of distribution of calorie intake throughout the day with the consumption of 4-5 meals/snacks;Weight Gain: Understanding of general recommendations for a high calorie, high protein meal plan that promotes weight gain by distributing calorie intake throughout the day with the consumption for 4-5 meals, snacks, and/or supplements    Improve shortness of breath with ADL's  Yes    Intervention  Provide education, individualized exercise plan and daily activity instruction to help decrease symptoms of SOB with activities of daily living.    Expected Outcomes  Short Term: Improve cardiorespiratory fitness to achieve a reduction of symptoms when performing ADLs;Long Term: Be able to perform more ADLs without symptoms or delay the onset of symptoms       Core Components/Risk Factors/Patient Goals Review:  Goals and Risk Factor Review    Row Name 12/19/17 1320 01/17/18 1033           Core Components/Risk Factors/Patient Goals Review   Personal Goals Review  Weight Management/Obesity;Improve shortness of breath with ADL's;Increase knowledge of respiratory medications and ability to use respiratory devices properly.;Develop more efficient breathing techniques such as purse lipped breathing and diaphragmatic breathing and practicing self-pacing with activity.  Weight Management/Obesity;Improve shortness of breath with ADL's;Increase knowledge of respiratory medications and ability  to use respiratory devices properly.;Develop more efficient breathing techniques such as purse lipped breathing and diaphragmatic breathing and practicing self-pacing with activity.      Review  -  Pt has completed 3 exercise sessions.  Unable to review pt progress toward pt goals.  Hopeful that pt will show progress toward pt goals in the next 30 day review.      Expected Outcomes  -  See Admission Goals/Outcomes         Core Components/Risk Factors/Patient Goals at Discharge (Final Review):  Goals and Risk Factor Review - 01/17/18 1033      Core Components/Risk Factors/Patient Goals Review   Personal Goals Review  Weight Management/Obesity;Improve shortness of breath with ADL's;Increase knowledge of respiratory medications and ability to use respiratory devices properly.;Develop more efficient breathing techniques such as purse lipped breathing and diaphragmatic breathing and practicing self-pacing with activity.    Review  Pt has completed 3 exercise sessions.  Unable to review pt progress toward pt goals.  Hopeful that pt will show progress toward pt goals in the next 30 day review.    Expected Outcomes  See Admission  Goals/Outcomes       ITP Comments: ITP Comments    Row Name 01/17/18 1033           ITP Comments  Dr. Jennet Maduro, Medical Director          Comments: Pt has completed 3 exercise sessions. Cherre Huger, BSN Cardiac and Training and development officer

## 2018-01-19 ENCOUNTER — Encounter (HOSPITAL_COMMUNITY)
Admission: RE | Admit: 2018-01-19 | Discharge: 2018-01-19 | Disposition: A | Discharge status: Home / Self Care | Payer: Medicare Other | Source: Ambulatory Visit | Attending: Pulmonary Disease | Admitting: Pulmonary Disease

## 2018-01-19 DIAGNOSIS — J849 Interstitial pulmonary disease, unspecified: Secondary | ICD-10-CM

## 2018-01-19 NOTE — Progress Notes (Signed)
Daily Session Note  Patient Details  Name: Kathy [name] MRN: [id number] Date of Birth: 06/30/1940 Referring Provider:     Pulmonary Rehab Walk Test from 12/22/2017 in [place]  Referring Provider  Dr. Halford Chessman      Encounter Date: 01/19/2018  Check In: Session Check In - 01/19/18 1405      Check-In   Supervising physician immediately available to respond to emergencies  [facility] immediately available    Physician(s)  Dr. Tawanna Solo    Location  [facility]    Staff Present  Su Hilt, MS, ACSM RCEP, Exercise Physiologist;Burhan Barham Ysidro Evert, RN;Carlette Carlton, RN, BSN    Medication changes reported      Yes    Fall or balance concerns reported     No    Tobacco Cessation  No Change    Warm-up and Cool-down  Performed as group-led instruction    Resistance Training Performed  Yes    VAD Patient?  No    PAD/SET Patient?  No      Pain Assessment   Currently in Pain?  No/denies    Multiple Pain Sites  No       Capillary Blood Glucose: No results found for this or any previous visit (from the past 24 hour(s)).    Social History   Tobacco Use  Smoking Status Former Smoker  . Packs/day: 1.00  . Years: 29.00  . Pack years: 29.00  . Types: Cigarettes  . Last attempt to quit: 06/28/1986  . Years since quitting: 31.5  Smokeless Tobacco Never Used  Tobacco Comment   quit 40+ yrs ago, 1 PPD for a few years    Goals Met:  Exercise tolerated well No report of cardiac concerns or symptoms Strength training completed today  Goals Unmet:  Not Applicable  Comments: Service time is from 1330 to 1535    Dr. Rush Farmer is Medical Director for Pulmonary Rehab at [facility].

## 2018-01-24 ENCOUNTER — Encounter (HOSPITAL_COMMUNITY)
Admission: RE | Admit: 2018-01-24 | Discharge: 2018-01-24 | Disposition: A | Discharge status: Home / Self Care | Payer: Medicare Other | Source: Ambulatory Visit | Attending: Pulmonary Disease | Admitting: Pulmonary Disease

## 2018-01-24 DIAGNOSIS — J849 Interstitial pulmonary disease, unspecified: Secondary | ICD-10-CM

## 2018-01-24 NOTE — Progress Notes (Signed)
Daily Session Note  Patient Details  Name: Kathy [name] MRN: [id number] Date of Birth: [birth year] Referring Provider:     Pulmonary Rehab Walk Test from 12/22/2017 in [place]  Referring Provider  Dr. Halford Chessman      Encounter Date: 01/24/2018  Check In: Session Check In - 01/24/18 1331      Check-In   Supervising physician immediately available to respond to emergencies  [facility] immediately available    Physician(s)  Dr. Rodena Piety    Location  [facility]    Staff Present  Su Hilt, MS, ACSM RCEP, Exercise Physiologist;Vrishank Moster Ysidro Evert, RN;Carlette Carlton, RN, BSN    Medication changes reported      No    Fall or balance concerns reported     No    Tobacco Cessation  No Change    Warm-up and Cool-down  Performed as group-led instruction    Resistance Training Performed  Yes    VAD Patient?  No    PAD/SET Patient?  No      Pain Assessment   Currently in Pain?  No/denies    Multiple Pain Sites  No       Capillary Blood Glucose: No results found for this or any previous visit (from the past 24 hour(s)).    Social History   Tobacco Use  Smoking Status Former Smoker  . Packs/day: 1.00  . Years: 29.00  . Pack years: 29.00  . Types: Cigarettes  . Last attempt to quit: 06/28/1986  . Years since quitting: 31.5  Smokeless Tobacco Never Used  Tobacco Comment   quit 40+ yrs ago, 1 PPD for a few years    Goals Met:  Exercise tolerated well No report of cardiac concerns or symptoms Strength training completed today  Goals Unmet:  Not Applicable  Comments: Service time is from 1330 to 1510    Dr. Rush Farmer is Medical Director for Pulmonary Rehab at [facility].

## 2018-01-26 ENCOUNTER — Encounter (HOSPITAL_COMMUNITY)
Admission: RE | Admit: 2018-01-26 | Discharge: 2018-01-26 | Disposition: A | Discharge status: Home / Self Care | Payer: Medicare Other | Source: Ambulatory Visit | Attending: Pulmonary Disease | Admitting: Pulmonary Disease

## 2018-01-26 DIAGNOSIS — J849 Interstitial pulmonary disease, unspecified: Secondary | ICD-10-CM | POA: Insufficient documentation

## 2018-01-26 NOTE — Progress Notes (Signed)
Daily Session Note  Patient Details  Name: Kathy [name] MRN: [id number] Date of Birth: [birth year] Referring Provider:     Pulmonary Rehab Walk Test from 12/22/2017 in [place]  Referring Provider  Dr. Halford Chessman      Encounter Date: 01/26/2018  Check In: Session Check In - 01/26/18 1628      Check-In   Supervising physician immediately available to respond to emergencies  [facility] immediately available    Physician(s)  Dr. Herbert Moors    Staff Present  Cloyde Reams Adel Neyer, MS, ACSM RCEP, Exercise Physiologist;Carlette Wilber Oliphant, RN, BSN;Other    Medication changes reported      No    Fall or balance concerns reported     No    Tobacco Cessation  No Change    Warm-up and Cool-down  Performed as group-led instruction    Resistance Training Performed  Yes    VAD Patient?  No    PAD/SET Patient?  No      Pain Assessment   Currently in Pain?  No/denies       Capillary Blood Glucose: No results found for this or any previous visit (from the past 24 hour(s)).    Social History   Tobacco Use  Smoking Status Former Smoker  . Packs/day: 1.00  . Years: 29.00  . Pack years: 29.00  . Types: Cigarettes  . Last attempt to quit: 06/28/1986  . Years since quitting: 31.6  Smokeless Tobacco Never Used  Tobacco Comment   quit 40+ yrs ago, 1 PPD for a few years    Goals Met:  Exercise tolerated well No report of cardiac concerns or symptoms Strength training completed today  Goals Unmet:  Not Applicable  Comments: Service time is from 1:30p to 3:30p    Dr. Rush Farmer is Medical Director for Pulmonary Rehab at [facility].

## 2018-01-31 ENCOUNTER — Encounter (HOSPITAL_COMMUNITY): Payer: Self-pay | Admitting: *Deleted

## 2018-01-31 ENCOUNTER — Encounter (HOSPITAL_COMMUNITY): Payer: Medicare Other

## 2018-01-31 NOTE — Progress Notes (Signed)
Daily Session Note  Patient Details  Name: Kathy [name] MRN: [id number] Date of Birth: Sep 19, 1940 Referring Provider:     Pulmonary Rehab Walk Test from 12/22/2017 in [place]  Referring Provider  Dr. Halford Chessman      Encounter Date: 01/31/2018  Check In:   Capillary Blood Glucose: No results found for this or any previous visit (from the past 24 hour(s)).  Exercise Prescription Changes - 01/31/18 1800      Response to Exercise   Blood Pressure (Admit)  124/78    Blood Pressure (Exercise)  130/70    Blood Pressure (Exit)  114/70    Heart Rate (Admit)  82 bpm    Heart Rate (Exercise)  76 bpm    Heart Rate (Exit)  69 bpm    Oxygen Saturation (Admit)  96 %    Oxygen Saturation (Exercise)  95 %    Oxygen Saturation (Exit)  97 %    Rating of Perceived Exertion (Exercise)  13    Perceived Dyspnea (Exercise)  1    Duration  Progress to 45 minutes of aerobic exercise without signs/symptoms of physical distress    Intensity  THRR unchanged      Progression   Progression  Continue to progress workloads to maintain intensity without signs/symptoms of physical distress.      Resistance Training   Training Prescription  Yes    Weight  orange bands    Reps  10-15    Time  10 Minutes      Interval Training   Interval Training  No      Oxygen   Oxygen  Continuous    Liters  2      NuStep   Level  1    SPM  80    Minutes  34    METs  1.3       Social History   Tobacco Use  Smoking Status Former Smoker  . Packs/day: 1.00  . Years: 29.00  . Pack years: 29.00  . Types: Cigarettes  . Last attempt to quit: 06/28/1986  . Years since quitting: 31.6  Smokeless Tobacco Never Used  Tobacco Comment   quit 40+ yrs ago, 1 PPD for a few years    Goals Met:  Exercise tolerated well No report of cardiac concerns or symptoms Strength training completed today  Goals Unmet:  Not Applicable  Comments: Service time is from 1330 to 1545    Dr. Rush Farmer is Medical Director for Pulmonary Rehab at [facility].

## 2018-02-02 ENCOUNTER — Encounter (HOSPITAL_COMMUNITY): Payer: Medicare Other

## 2018-02-02 ENCOUNTER — Telehealth: Payer: Self-pay | Admitting: Pulmonary Disease

## 2018-02-02 NOTE — Telephone Encounter (Signed)
HRCT chest 01/12/18 >> 3 mm nodule Lt apex, patchy air trapping, patchy subpleural reticulation and GGO, mild traction BTX   Please let her know that her CT chest looks the same as August 2018.  No change to current tx plan.  Will call her after her PFTs are done.  Please ensure the that PFT has been scheduled.

## 2018-02-07 ENCOUNTER — Encounter (HOSPITAL_COMMUNITY)
Admission: RE | Admit: 2018-02-07 | Discharge: 2018-02-07 | Disposition: A | Discharge status: Home / Self Care | Payer: Medicare Other | Source: Ambulatory Visit | Attending: Pulmonary Disease | Admitting: Pulmonary Disease

## 2018-02-07 DIAGNOSIS — J849 Interstitial pulmonary disease, unspecified: Secondary | ICD-10-CM

## 2018-02-07 NOTE — Progress Notes (Signed)
Daily Session Note  Patient Details  Name: Kathy [name] MRN: [id number] Date of Birth: 1941-01-10 Referring Provider:     Pulmonary Rehab Walk Test from 12/22/2017 in [place]  Referring Provider  Dr. Halford Chessman      Encounter Date: 02/07/2018  Check In: Session Check In - 02/07/18 1524      Check-In   Supervising physician immediately available to respond to emergencies  [facility] immediately available    Physician(s)  Dr. Florene Glen    Location  [facility]    Staff Present  Maurice Small, RN, BSN;Molly DiVincenzo, MS, ACSM RCEP, Exercise Physiologist;Shadana Pry Ysidro Evert, RN    Medication changes reported      No    Fall or balance concerns reported     No    Tobacco Cessation  No Change    Warm-up and Cool-down  Performed as group-led instruction    Resistance Training Performed  Yes    VAD Patient?  No    PAD/SET Patient?  No      Pain Assessment   Currently in Pain?  No/denies    Multiple Pain Sites  No       Capillary Blood Glucose: No results found for this or any previous visit (from the past 24 hour(s)).    Social History   Tobacco Use  Smoking Status Former Smoker  . Packs/day: 1.00  . Years: 29.00  . Pack years: 29.00  . Types: Cigarettes  . Last attempt to quit: 06/28/1986  . Years since quitting: 31.6  Smokeless Tobacco Never Used  Tobacco Comment   quit 40+ yrs ago, 1 PPD for a few years    Goals Met:  Exercise tolerated well No report of cardiac concerns or symptoms Strength training completed today  Goals Unmet:  Not Applicable  Comments: Service time is from 1330 to 1500    Dr. Rush Farmer is Medical Director for Pulmonary Rehab at [facility].

## 2018-02-09 ENCOUNTER — Encounter (HOSPITAL_COMMUNITY)
Admission: RE | Admit: 2018-02-09 | Discharge: 2018-02-09 | Disposition: A | Discharge status: Home / Self Care | Payer: Medicare Other | Source: Ambulatory Visit | Attending: Pulmonary Disease | Admitting: Pulmonary Disease

## 2018-02-09 DIAGNOSIS — J849 Interstitial pulmonary disease, unspecified: Secondary | ICD-10-CM

## 2018-02-09 NOTE — Progress Notes (Signed)
Daily Session Note  Patient Details  Name: Kathy [name] MRN: [id number] Date of Birth: Sep 13, 1940 Referring Provider:     Pulmonary Rehab Walk Test from 12/22/2017 in [place]  Referring Provider  Dr. Halford Chessman      Encounter Date: 02/09/2018  Check In: Session Check In - 02/09/18 1619      Check-In   Supervising physician immediately available to respond to emergencies  [facility] immediately available    Physician(s)  Dr. Florene Glen    Location  [facility]    Staff Present  Maurice Small, RN, BSN;Duan Scharnhorst, MS, ACSM RCEP, Exercise Physiologist;Annedrea Stackhouse, RN, [place]    Medication changes reported      No    Fall or balance concerns reported     No    Tobacco Cessation  No Change    Warm-up and Cool-down  Performed as group-led instruction    Resistance Training Performed  Yes    VAD Patient?  No    PAD/SET Patient?  No      Pain Assessment   Currently in Pain?  No/denies    Multiple Pain Sites  No       Capillary Blood Glucose: No results found for this or any previous visit (from the past 24 hour(s)).    Social History   Tobacco Use  Smoking Status Former Smoker  . Packs/day: 1.00  . Years: 29.00  . Pack years: 29.00  . Types: Cigarettes  . Last attempt to quit: 06/28/1986  . Years since quitting: 31.6  Smokeless Tobacco Never Used  Tobacco Comment   quit 40+ yrs ago, 1 PPD for a few years    Goals Met:  Using PLB without cueing & demonstrates good technique Changing diet to healthy choices, watching portion sizes Exercise tolerated well  Goals Unmet:  Not Applicable  Comments: Service time is from 1:30p to 3:30p    Dr. Rush Farmer is Medical Director for Pulmonary Rehab at [facility].

## 2018-02-09 NOTE — Telephone Encounter (Signed)
Attempted to call patient today regarding results. I did not receive an answer at time of call. I have left a voicemail message for pt to return call. X1  

## 2018-02-14 ENCOUNTER — Encounter (HOSPITAL_COMMUNITY)
Admission: RE | Admit: 2018-02-14 | Discharge: 2018-02-14 | Disposition: A | Discharge status: Home / Self Care | Payer: Medicare Other | Source: Ambulatory Visit | Attending: Pulmonary Disease | Admitting: Pulmonary Disease

## 2018-02-14 DIAGNOSIS — J849 Interstitial pulmonary disease, unspecified: Secondary | ICD-10-CM | POA: Diagnosis not present

## 2018-02-14 NOTE — Telephone Encounter (Signed)
Attempted to call patient today regarding results. I did not receive an answer at time of call. I have left a voicemail message for pt to return call. X2  

## 2018-02-14 NOTE — Progress Notes (Signed)
Daily Session Note  Patient Details  Name: Kathy [name] MRN: [id number] Date of Birth: Nov 15, 1940 Referring Provider:     Pulmonary Rehab Walk Test from 12/22/2017 in [place]  Referring Provider  Dr. Halford Chessman      Encounter Date: 02/14/2018  Check In: Session Check In - 02/14/18 [place]      Check-In   Supervising physician immediately available to respond to emergencies  [facility] immediately available    Physician(s)  Dr. Florene Glen    Location  [facility]    Staff Present  Maurice Small, RN, BSN;Demont Linford, MS, ACSM RCEP, Exercise Physiologist;Annedrea Rosezella [place], RN, MHA;Olinty Auburn, MS, ACSM CEP, Exercise Physiologist    Medication changes reported      No    Fall or balance concerns reported     No    Tobacco Cessation  No Change    Warm-up and Cool-down  Performed as group-led instruction    Resistance Training Performed  Yes    VAD Patient?  No    PAD/SET Patient?  No      Pain Assessment   Currently in Pain?  No/denies       Capillary Blood Glucose: No results found for this or any previous visit (from the past 24 hour(s)).  Exercise Prescription Changes - 02/14/18 1600      Response to Exercise   Blood Pressure (Admit)  150/80    Blood Pressure (Exercise)  108/72    Blood Pressure (Exit)  124/72    Heart Rate (Admit)  88 bpm    Heart Rate (Exercise)  59 bpm    Heart Rate (Exit)  61 bpm    Oxygen Saturation (Admit)  97 %    Oxygen Saturation (Exercise)  94 %    Oxygen Saturation (Exit)  93 %    Rating of Perceived Exertion (Exercise)  13    Perceived Dyspnea (Exercise)  2    Duration  Progress to 45 minutes of aerobic exercise without signs/symptoms of physical distress    Intensity  THRR unchanged      Progression   Progression  Continue to progress workloads to maintain intensity without signs/symptoms of physical distress.      Resistance Training   Training Prescription  Yes    Weight   orange bands    Reps  10-15    Time  10 Minutes      Interval Training   Interval Training  No      Oxygen   Oxygen  Continuous    Liters  2      NuStep   Level  1    SPM  80    Minutes  45    METs  1.3       Social History   Tobacco Use  Smoking Status Former Smoker  . Packs/day: 1.00  . Years: 29.00  . Pack years: 29.00  . Types: Cigarettes  . Last attempt to quit: 06/28/1986  . Years since quitting: 31.6  Smokeless Tobacco Never Used  Tobacco Comment   quit 40+ yrs ago, 1 PPD for a few years    Goals Met:  Exercise tolerated well Personal goals reviewed  Goals Unmet:  Not Applicable  Comments: Service time is from 1:30P to 3:10p    Dr. Rush Farmer is Medical Director for Pulmonary Rehab at [facility]

## 2018-02-15 NOTE — Progress Notes (Signed)
Pulmonary Individual Treatment Plan  Patient Details  Name: Kathy [name] MRN: [id number] Date of Birth: 1940-10-15 Referring Provider:     Pulmonary Rehab Walk Test from 12/22/2017 in [place]  Referring Provider  Dr. Halford Chessman      Initial Encounter Date:    Pulmonary Rehab Walk Test from 12/22/2017 in [place]  Date  12/26/17      Visit Diagnosis: Interstitial lung disease ([place])  Patient's Home Medications on Admission:   Current Outpatient Medications:  .  albuterol (PROVENTIL HFA;VENTOLIN HFA) 108 (90 Base) MCG/ACT inhaler, Inhale 1-2 puffs into the lungs every 6 (six) hours as needed for wheezing or shortness of breath., Disp: 1 Inhaler, Rfl: 2 .  benzonatate (TESSALON PERLES) 100 MG capsule, Take 1 capsule (100 mg total) by mouth 2 (two) times daily as needed for cough., Disp: 60 capsule, Rfl: 5 .  etodolac (LODINE) 300 MG capsule, Take 300 mg by mouth 2 (two) times daily., Disp: , Rfl:  .  fenofibrate micronized (LOFIBRA) 134 MG capsule, Take 134 mg by mouth daily before breakfast., Disp: , Rfl:  .  furosemide (LASIX) 40 MG tablet, 1 tab by mouth in the AM, and 1 tab by mouth in the PM as needed for persistent swelling or weight gain more than 3-5 lbs, Disp: 60 tablet, Rfl: 11 .  losartan (COZAAR) 100 MG tablet, Take 50 mg by mouth daily., Disp: , Rfl:  .  metoprolol succinate (TOPROL-XL) 50 MG 24 hr tablet, Take 1 tablet (50 mg total) by mouth daily., Disp: 90 tablet, Rfl: 1 .  predniSONE (DELTASONE) 20 MG tablet, Take 1 tablet (20 mg total) by mouth daily with breakfast., Disp: 30 tablet, Rfl: 5 .  venlafaxine XR (EFFEXOR-XR) 150 MG 24 hr capsule, Take 1 capsule (150 mg total) by mouth daily., Disp: 90 capsule, Rfl: 3  Past Medical History: Past Medical History:  Diagnosis Date  . Arthritis    fingers  . CKD (chronic kidney disease) stage 3, GFR 30-59 ml/min (HCC) 11/30/2017  . Depression   . Dizziness    in AM,  getting out of bed  . Dysrhythmia    "skips a beat" sometimes - followed by PCP  . Endometrial ca ([place]) 11/30/2017   S/p surgury 1990's  . GERD (gastroesophageal reflux disease) 11/30/2017  . HLD (hyperlipidemia) 11/30/2017  . Hypercholesteremia   . Hypertension   . Neuropathy    bilateral feet  . Shortness of breath dyspnea   . Sleep apnea    has CPAP, doesn't use  . Umbilical hernia     Tobacco Use: Social History   Tobacco Use  Smoking Status Former Smoker  . Packs/day: 1.00  . Years: 29.00  . Pack years: 29.00  . Types: Cigarettes  . Last attempt to quit: 06/28/1986  . Years since quitting: 31.6  Smokeless Tobacco Never Used  Tobacco Comment   quit 40+ yrs ago, 1 PPD for a few years    Labs: Recent Review Flowsheet Data    Labs for ITP Cardiac and Pulmonary Rehab Latest Ref Rng & Units 05/13/2008 07/31/2014 06/27/2016 11/30/2017   Cholestrol 0 - 200 mg/dL - - - 223(H)   LDLCALC 0 - 99 mg/dL - - - 127(H)   HDL >39.00 mg/dL - - - 74.40   Trlycerides 0.0 - 149.0 mg/dL - - - 106.0   TCO2 0 - 100 mmol/L _0 -      Capillary Blood Glucose:  Lab Results  Component Value Date   GLUCAP 134 (H) 06/29/2016     Pulmonary Assessment Scores: Pulmonary Assessment Scores    Row Name 12/26/17 0905 12/27/17 1634 01/05/18 1543     ADL UCSD   ADL Phase  Entry  Entry  -   SOB Score total  -  80  -     CAT Score   CAT Score  -  -  29     mMRC Score   mMRC Score  4  -  -      Pulmonary Function Assessment: Pulmonary Function Assessment - 12/19/17 1307      Breath   Bilateral Breath Sounds  Clear    Shortness of Breath  Limiting activity;Yes       Exercise Target Goals: Exercise Program Goal: Individual exercise prescription set using results from initial 6 min walk test and THRR while considering  patient's activity barriers and safety.   Exercise Prescription Goal: Initial exercise prescription builds to 30-45 minutes a day of aerobic activity, 2-3 days per  week.  Home exercise guidelines will be given to patient during program as part of exercise prescription that the participant will acknowledge.  Activity Barriers & Risk Stratification: Activity Barriers & Cardiac Risk Stratification - 12/19/17 1313      Activity Barriers & Cardiac Risk Stratification   Activity Barriers  Back Problems;Shortness of Breath;Joint Problems;Arthritis       6 Minute Walk: 6 Minute Walk    Row Name 12/26/17 0905         6 Minute Walk   Phase  Initial     Distance  500 feet     Walk Time  - 4 minutes and 9 seconds     # of Rest Breaks  - 1 minute 51 seconds in total     MPH  0.94     METS  1.77     RPE  14     Perceived Dyspnea   3     Symptoms  Yes (comment)     Comments  7/10 lower back pain     Resting HR  97 bpm     Resting BP  100/52     Resting Oxygen Saturation   96 %     Exercise Oxygen Saturation  during 6 min walk  91 %     Max Ex. HR  125 bpm     Max Ex. BP  138/48       Interval HR   1 Minute HR  118     2 Minute HR  125     3 Minute HR  122     4 Minute HR  119     5 Minute HR  125     6 Minute HR  122     2 Minute Post HR  104     Interval Heart Rate?  Yes       Interval Oxygen   Interval Oxygen?  Yes     Baseline Oxygen Saturation %  96 %     1 Minute Oxygen Saturation %  93 %     1 Minute Liters of Oxygen  2 L     2 Minute Oxygen Saturation %  93 %     2 Minute Liters of Oxygen  2 L     3 Minute Oxygen Saturation %  94 %     3 Minute Liters of Oxygen  2 L  4 Minute Oxygen Saturation %  92 %     4 Minute Liters of Oxygen  2 L     5 Minute Oxygen Saturation %  91 %     5 Minute Liters of Oxygen  2 L     6 Minute Oxygen Saturation %  91 %     6 Minute Liters of Oxygen  2 L     2 Minute Post Oxygen Saturation %  95 %     2 Minute Post Liters of Oxygen  2 L        Oxygen Initial Assessment: Oxygen Initial Assessment - 12/19/17 1312      Home Oxygen   Home Oxygen Device  Home Concentrator;Portable Concentrator     Sleep Oxygen Prescription  Continuous    Liters per minute  2    Home Exercise Oxygen Prescription  Continuous    Liters per minute  2    Home at Rest Exercise Oxygen Prescription  Continuous    Liters per minute  2    Compliance with Home Oxygen Use  Yes       Oxygen Re-Evaluation: Oxygen Re-Evaluation    Row Name 12/26/17 0904 01/16/18 1230 02/14/18 0713         Program Oxygen Prescription   Program Oxygen Prescription  Continuous;E-Tanks  Continuous;E-Tanks  Continuous;E-Tanks     Liters per minute  _0 Home Oxygen   Home Oxygen Device  Home Concentrator;Portable Concentrator  Home Concentrator;Portable Concentrator  Home Concentrator;Portable Concentrator     Sleep Oxygen Prescription  Continuous  Continuous  Continuous     Liters per minute  _1 Home Exercise Oxygen Prescription  Continuous  Continuous  Continuous     Liters per minute  -  2  2     Home at Rest Exercise Oxygen Prescription  Continuous  Continuous  Continuous     Liters per minute  _2 Compliance with Home Oxygen Use  Yes  Yes  Yes       Goals/Expected Outcomes   Short Term Goals  To learn and exhibit compliance with exercise, home and travel O2 prescription;To learn and understand importance of monitoring SPO2 with pulse oximeter and demonstrate accurate use of the pulse oximeter.;To learn and understand importance of maintaining oxygen saturations>88%;To learn and demonstrate proper pursed lip breathing techniques or other breathing techniques.;To learn and demonstrate proper use of respiratory medications  To learn and exhibit compliance with exercise, home and travel O2 prescription;To learn and understand importance of monitoring SPO2 with pulse oximeter and demonstrate accurate use of the pulse oximeter.;To learn and understand importance of maintaining oxygen saturations>88%;To learn and demonstrate proper pursed lip breathing techniques or other breathing techniques.;To learn  and demonstrate proper use of respiratory medications  To learn and exhibit compliance with exercise, home and travel O2 prescription;To learn and understand importance of monitoring SPO2 with pulse oximeter and demonstrate accurate use of the pulse oximeter.;To learn and understand importance of maintaining oxygen saturations>88%;To learn and demonstrate proper pursed lip breathing techniques or other breathing techniques.;To learn and demonstrate proper use of respiratory medications     Long  Term Goals  Exhibits compliance with exercise, home and travel O2 prescription;Verbalizes importance of monitoring SPO2 with pulse oximeter and return demonstration;Maintenance of O2 saturations>88%;Exhibits proper breathing techniques, such as pursed lip breathing or other method  taught during program session;Compliance with respiratory medication;Demonstrates proper use of MDI's  Exhibits compliance with exercise, home and travel O2 prescription;Verbalizes importance of monitoring SPO2 with pulse oximeter and return demonstration;Maintenance of O2 saturations>88%;Exhibits proper breathing techniques, such as pursed lip breathing or other method taught during program session;Compliance with respiratory medication;Demonstrates proper use of MDI's  Exhibits compliance with exercise, home and travel O2 prescription;Verbalizes importance of monitoring SPO2 with pulse oximeter and return demonstration;Maintenance of O2 saturations>88%;Exhibits proper breathing techniques, such as pursed lip breathing or other method taught during program session;Compliance with respiratory medication;Demonstrates proper use of MDI's     Goals/Expected Outcomes  compliance and understanding  compliance and understanding  compliance and understanding        Oxygen Discharge (Final Oxygen Re-Evaluation): Oxygen Re-Evaluation - 02/14/18 0713      Program Oxygen Prescription   Program Oxygen Prescription  Continuous;E-Tanks    Liters  per minute  2      Home Oxygen   Home Oxygen Device  Home Concentrator;Portable Concentrator    Sleep Oxygen Prescription  Continuous    Liters per minute  2    Home Exercise Oxygen Prescription  Continuous    Liters per minute  2    Home at Rest Exercise Oxygen Prescription  Continuous    Liters per minute  2    Compliance with Home Oxygen Use  Yes      Goals/Expected Outcomes   Short Term Goals  To learn and exhibit compliance with exercise, home and travel O2 prescription;To learn and understand importance of monitoring SPO2 with pulse oximeter and demonstrate accurate use of the pulse oximeter.;To learn and understand importance of maintaining oxygen saturations>88%;To learn and demonstrate proper pursed lip breathing techniques or other breathing techniques.;To learn and demonstrate proper use of respiratory medications    Long  Term Goals  Exhibits compliance with exercise, home and travel O2 prescription;Verbalizes importance of monitoring SPO2 with pulse oximeter and return demonstration;Maintenance of O2 saturations>88%;Exhibits proper breathing techniques, such as pursed lip breathing or other method taught during program session;Compliance with respiratory medication;Demonstrates proper use of MDI's    Goals/Expected Outcomes  compliance and understanding       Initial Exercise Prescription: Initial Exercise Prescription - 12/26/17 0900      Date of Initial Exercise RX and Referring Provider   Date  12/26/17    Referring Provider  Dr. Halford Chessman      Oxygen   Oxygen  Continuous    Liters  2      NuStep   Level  1    SPM  80    Minutes  17      Prescription Details   Frequency (times per week)  2    Duration  Progress to 45 minutes of aerobic exercise without signs/symptoms of physical distress      Intensity   THRR 40-80% of Max Heartrate  57-114    Ratings of Perceived Exertion  11-13    Perceived Dyspnea  0-4      Progression   Progression  Continue progressive  overload as per policy without signs/symptoms or physical distress.      Resistance Training   Training Prescription  Yes    Weight  orange bands    Reps  10-15       Perform Capillary Blood Glucose checks as needed.  Exercise Prescription Changes:  Exercise Prescription Changes    Row Name 01/03/18 1500 01/17/18 1500 01/31/18 1800 02/14/18 1600  Response to Exercise   Blood Pressure (Admit)  150/74  133/90  124/78  150/80    Blood Pressure (Exercise)  148/78  144/80  130/70  108/72    Blood Pressure (Exit)  148/80  130/66  114/70  124/72    Heart Rate (Admit)  88 bpm  84 bpm  82 bpm  88 bpm    Heart Rate (Exercise)  83 bpm  78 bpm  76 bpm  59 bpm    Heart Rate (Exit)  85 bpm  65 bpm  69 bpm  61 bpm    Oxygen Saturation (Admit)  95 %  97 %  96 %  97 %    Oxygen Saturation (Exercise)  95 %  94 %  95 %  94 %    Oxygen Saturation (Exit)  95 %  95 %  97 %  93 %    Rating of Perceived Exertion (Exercise)  _0 Perceived Dyspnea (Exercise)  _1 Duration  Progress to 45 minutes of aerobic exercise without signs/symptoms of physical distress  Progress to 45 minutes of aerobic exercise without signs/symptoms of physical distress  Progress to 45 minutes of aerobic exercise without signs/symptoms of physical distress  Progress to 45 minutes of aerobic exercise without signs/symptoms of physical distress    Intensity  - 40- 80% HRR  THRR unchanged  THRR unchanged  THRR unchanged      Progression   Progression  Continue to progress workloads to maintain intensity without signs/symptoms of physical distress.  -  Continue to progress workloads to maintain intensity without signs/symptoms of physical distress.  Continue to progress workloads to maintain intensity without signs/symptoms of physical distress.      Resistance Training   Training Prescription  Yes  Yes  Yes  Yes    Weight  orange bands  orange bands  orange bands  orange bands    Reps  10-15  10-15   10-15  10-15    Time  10 Minutes  10 Minutes  10 Minutes  10 Minutes      Interval Training   Interval Training  No  No  No  No      Oxygen   Oxygen  Continuous  Continuous  Continuous  Continuous    Liters  _2 NuStep   Level  _3 SPM  80  80  80  80    Minutes  51  51  34  45    METs  1.5  1.6  1.3  1.3       Exercise Comments:   Exercise Goals and Review:   Exercise Goals Re-Evaluation : Exercise Goals Re-Evaluation    Row Name 01/16/18 1231 02/14/18 0713           Exercise Goal Re-Evaluation   Exercise Goals Review  Increase Physical Activity;Able to understand and use rate of perceived exertion (RPE) scale;Knowledge and understanding of Target Heart Rate Range (THRR);Understanding of Exercise Prescription;Increase Strength and Stamina;Able to understand and use Dyspnea scale  Increase Physical Activity;Able to understand and use rate of perceived exertion (RPE) scale;Knowledge and understanding of Target Heart Rate Range (THRR);Understanding of Exercise Prescription;Increase Strength and Stamina;Able to understand and use Dyspnea scale      Comments  Patient has only attended  three rehab sessions. Will cont. to monitor and progress as able.   Patient is making slow and steady progress. MET average places her in a low level. Mostly non-weight exercise. Will cont to progress and motivate.      Expected Outcomes  Through rehab and exercising at home, patient will find it easier to carry out ADL's and will gain the confidence to establish an exercise routine at home.   Through rehab and exercising at home, patient will find it easier to carry out ADL's and will gain the confidence to establish an exercise routine at home.          Discharge Exercise Prescription (Final Exercise Prescription Changes): Exercise Prescription Changes - 02/14/18 1600      Response to Exercise   Blood Pressure (Admit)  150/80    Blood Pressure (Exercise)  108/72    Blood  Pressure (Exit)  124/72    Heart Rate (Admit)  88 bpm    Heart Rate (Exercise)  59 bpm    Heart Rate (Exit)  61 bpm    Oxygen Saturation (Admit)  97 %    Oxygen Saturation (Exercise)  94 %    Oxygen Saturation (Exit)  93 %    Rating of Perceived Exertion (Exercise)  13    Perceived Dyspnea (Exercise)  2    Duration  Progress to 45 minutes of aerobic exercise without signs/symptoms of physical distress    Intensity  THRR unchanged      Progression   Progression  Continue to progress workloads to maintain intensity without signs/symptoms of physical distress.      Resistance Training   Training Prescription  Yes    Weight  orange bands    Reps  10-15    Time  10 Minutes      Interval Training   Interval Training  No      Oxygen   Oxygen  Continuous    Liters  2      NuStep   Level  1    SPM  80    Minutes  45    METs  1.3       Nutrition:  Target Goals: Understanding of nutrition guidelines, daily intake of sodium <1586m, cholesterol <2079m calories 30% from fat and 7% or less from saturated fats, daily to have 5 or more servings of fruits and vegetables.  Biometrics: Pre Biometrics - 12/19/17 1315      Pre Biometrics   Grip Strength  24 kg        Nutrition Therapy Plan and Nutrition Goals: Nutrition Therapy & Goals - 01/06/18 0833      Nutrition Therapy   Protein (specify units)  general, healthful      Personal Nutrition Goals   Nutrition Goal  Identify food quantities necessary to achieve wt loss of  -2# per week to a goal wt loss of 6-24 lb at graduation from pulmonary rehab.    Personal Goal #2  Pt to identify and limit food sources of sodium.      Intervention Plan   Intervention  Prescribe, educate and counsel regarding individualized specific dietary modifications aiming towards targeted core components such as weight, hypertension, lipid management, diabetes, heart failure and other comorbidities.    Expected Outcomes  Short Term Goal: Understand  basic principles of dietary content, such as calories, fat, sodium, cholesterol and nutrients.       Nutrition Assessments: Nutrition Assessments - 12/28/17 1234      Rate Your  Plate Scores   Pre Score  55       Nutrition Goals Re-Evaluation: Nutrition Goals Re-Evaluation    [place] Name 01/06/18 0834             Goals   Nutrition Goal  Identify food quantities necessary to achieve wt loss of  -2# per week to a goal wt loss of 6-24 lb at graduation from pulmonary rehab.          Nutrition Goals Discharge (Final Nutrition Goals Re-Evaluation): Nutrition Goals Re-Evaluation - 01/06/18 0834      Goals   Nutrition Goal  Identify food quantities necessary to achieve wt loss of  -2# per week to a goal wt loss of 6-24 lb at graduation from pulmonary rehab.       Psychosocial: Target Goals: Acknowledge presence or absence of significant depression and/or stress, maximize coping skills, provide positive support system. Participant is able to verbalize types and ability to use techniques and skills needed for reducing stress and depression.  Initial Review & Psychosocial Screening: Initial Psych Review & Screening - 12/19/17 1321      Initial Review   Current issues with  None Identified      Family Dynamics   Good Support System?  Yes      Barriers   Psychosocial barriers to participate in program  There are no identifiable barriers or psychosocial needs.      Screening Interventions   Interventions  Encouraged to exercise       Quality of Life Scores:  Scores of 19 and below usually indicate a poorer quality of life in these areas.  A difference of  2-3 points is a clinically meaningful difference.  A difference of 2-3 points in the total score of the Quality of Life Index has been associated with significant improvement in overall quality of life, self-image, physical symptoms, and general health in studies assessing change in quality of life.  PHQ-9: Recent Review  Flowsheet Data    Depression screen [facility] 2/9 12/19/2017 11/30/2017 07/26/2016   Decreased Interest 0 0 0   Down, Depressed, Hopeless 1 1 0   PHQ - 2 Score 1 1 0     Interpretation of Total Score  Total Score Depression Severity:  1-4 = Minimal depression, 5-9 = Mild depression, 10-14 = Moderate depression, 15-19 = Moderately severe depression, 20-27 = Severe depression   Psychosocial Evaluation and Intervention: Psychosocial Evaluation - 02/15/18 1248      Psychosocial Evaluation & Interventions   Interventions  Encouraged to exercise with the program and follow exercise prescription    Comments  No indentifiable barriers to participating in pulmonary rehab     Expected Outcomes  Pt will continue to display positive and healthy coping skill with positive outlook on her future.    Continue Psychosocial Services   Follow up required by staff       Psychosocial Re-Evaluation: Psychosocial Re-Evaluation    [facility] Name 01/17/18 1036 02/15/18 1248           Psychosocial Re-Evaluation   Current issues with  None Identified  None Identified      Comments  pt display pleasant and positive outlook.  Pt interacts well with staff and fellow participants  pt display pleasant and positive outlook.  Pt interacts well with staff and fellow participants      Expected Outcomes  Pt will contine to be free from any psychosocial barriers.  Pt will contine to be free from any psychosocial  barriers.      Interventions  Encouraged to attend Pulmonary Rehabilitation for the exercise;Stress management education;Relaxation education  Encouraged to attend Pulmonary Rehabilitation for the exercise;Stress management education;Relaxation education      Continue Psychosocial Services   Follow up required by staff  Follow up required by staff         Psychosocial Discharge (Final Psychosocial Re-Evaluation): Psychosocial Re-Evaluation - 02/15/18 1248      Psychosocial Re-Evaluation   Current issues with  None  Identified    Comments  pt display pleasant and positive outlook.  Pt interacts well with staff and fellow participants    Expected Outcomes  Pt will contine to be free from any psychosocial barriers.    Interventions  Encouraged to attend Pulmonary Rehabilitation for the exercise;Stress management education;Relaxation education    Continue Psychosocial Services   Follow up required by staff       Education: Education Goals: Education classes will be provided on a weekly basis, covering required topics. Participant will state understanding/return demonstration of topics presented.  Learning Barriers/Preferences: Learning Barriers/Preferences - 12/19/17 1306      Learning Barriers/Preferences   Learning Barriers  None    Learning Preferences  Computer/Internet       Education Topics: Risk Factor Reduction:  -Group instruction that is supported by a PowerPoint presentation. Instructor discusses the definition of a risk factor, different risk factors for pulmonary disease, and how the heart and lungs work together.     Nutrition for Pulmonary Patient:  -Group instruction provided by PowerPoint slides, verbal discussion, and written materials to support subject matter. The instructor gives an explanation and review of healthy diet recommendations, which includes a discussion on weight management, recommendations for fruit and vegetable consumption, as well as protein, fluid, caffeine, fiber, sodium, sugar, and alcohol. Tips for eating when patients are short of breath are discussed.   Pursed Lip Breathing:  -Group instruction that is supported by demonstration and informational handouts. Instructor discusses the benefits of pursed lip and diaphragmatic breathing and detailed demonstration on how to preform both.     Oxygen Safety:  -Group instruction provided by PowerPoint, verbal discussion, and written material to support subject matter. There is an overview of "What is Oxygen" and  "Why do we need it".  Instructor also reviews how to create a safe environment for oxygen use, the importance of using oxygen as prescribed, and the risks of noncompliance. There is a brief discussion on traveling with oxygen and resources the patient may utilize.   PULMONARY REHAB OTHER RESPIRATORY from 02/09/2018 in [place]  Date  01/12/18  Educator  Cloyde Reams  Instruction Review Code  1- Verbalizes Understanding      Oxygen Equipment:  -Group instruction provided by [employer] utilizing handouts, written materials, and Insurance underwriter.   PULMONARY REHAB OTHER RESPIRATORY from 02/09/2018 in [place]  Date  01/19/18  Educator  Crocker  Instruction Review Code  2- Demonstrated Understanding      Signs and Symptoms:  -Group instruction provided by written material and verbal discussion to support subject matter. Warning signs and symptoms of infection, stroke, and heart attack are reviewed and when to call the physician/911 reinforced. Tips for preventing the spread of infection discussed.   PULMONARY REHAB OTHER RESPIRATORY from 02/09/2018 in [place]  Date  01/05/18  Educator  Remo Lipps  Instruction Review Code  1- Verbalizes Understanding  Advanced Directives:  -Group instruction provided by verbal instruction and written material to support subject matter. Instructor reviews Advanced Directive laws and proper instruction for filling out document.   Pulmonary Video:  -Group video education that reviews the importance of medication and oxygen compliance, exercise, good nutrition, pulmonary hygiene, and pursed lip and diaphragmatic breathing for the pulmonary patient.   Exercise for the Pulmonary Patient:  -Group instruction that is supported by a PowerPoint presentation. Instructor discusses benefits of exercise, core components of exercise, frequency, duration, and  intensity of an exercise routine, importance of utilizing pulse oximetry during exercise, safety while exercising, and options of places to exercise outside of rehab.     PULMONARY REHAB OTHER RESPIRATORY from 02/09/2018 in [place]  Date  01/26/18  Instruction Review Code  1- Verbalizes Understanding      Pulmonary Medications:  -Verbally interactive group education provided by instructor with focus on inhaled medications and proper administration.   Anatomy and Physiology of the Respiratory System and Intimacy:  -Group instruction provided by PowerPoint, verbal discussion, and written material to support subject matter. Instructor reviews respiratory cycle and anatomical components of the respiratory system and their functions. Instructor also reviews differences in obstructive and restrictive respiratory diseases with examples of each. Intimacy, Sex, and Sexuality differences are reviewed with a discussion on how relationships can change when diagnosed with pulmonary disease. Common sexual concerns are reviewed.   MD DAY -A group question and answer session with a medical doctor that allows participants to ask questions that relate to their pulmonary disease state.   OTHER EDUCATION -Group or individual verbal, written, or video instructions that support the educational goals of the pulmonary rehab program.   Holiday Eating Survival Tips:  -Group instruction provided by PowerPoint slides, verbal discussion, and written materials to support subject matter. The instructor gives patients tips, tricks, and techniques to help them not only survive but enjoy the holidays despite the onslaught of food that accompanies the holidays.   Knowledge Questionnaire Score: Knowledge Questionnaire Score - 12/27/17 1634      Knowledge Questionnaire Score   Pre Score  15/18       Core Components/Risk Factors/Patient Goals at Admission: Personal Goals and Risk  Factors at Admission - 12/19/17 1317      Core Components/Risk Factors/Patient Goals on Admission    Weight Management  Yes    Intervention  Weight Management: Develop a combined nutrition and exercise program designed to reach desired caloric intake, while maintaining appropriate intake of nutrient and fiber, sodium and fats, and appropriate energy expenditure required for the weight goal.;Weight Management: Provide education and appropriate resources to help participant work on and attain dietary goals.;Weight Management/Obesity: Establish reasonable short term and long term weight goals.;Obesity: Provide education and appropriate resources to help participant work on and attain dietary goals.    Admit Weight  257 lb 8 oz (116.8 kg)    Goal Weight: Short Term  246 lb 14.6 oz (112 kg)    Goal Weight: Long Term  242 lb 8.1 oz (110 kg)    Expected Outcomes  Short Term: Continue to assess and modify interventions until short term weight is achieved;Long Term: Adherence to nutrition and physical activity/exercise program aimed toward attainment of established weight goal;Weight Maintenance: Understanding of the daily nutrition guidelines, which includes 25-35% calories from fat, 7% or less cal from saturated fats, less than 236m cholesterol, less than 1.5gm of sodium, & 5 or more servings of fruits and  vegetables daily;Weight Loss: Understanding of general recommendations for a balanced deficit meal plan, which promotes 1-2 lb weight loss per week and includes a negative energy balance of [phone] kcal/d;Understanding recommendations for meals to include 15-35% energy as protein, 25-35% energy from fat, 35-60% energy from carbohydrates, less than 260m of dietary cholesterol, 20-35 gm of total fiber daily;Understanding of distribution of calorie intake throughout the day with the consumption of 4-5 meals/snacks;Weight Gain: Understanding of general recommendations for a high calorie, high protein meal plan  that promotes weight gain by distributing calorie intake throughout the day with the consumption for 4-5 meals, snacks, and/or supplements    Improve shortness of breath with ADL's  Yes    Intervention  Provide education, individualized exercise plan and daily activity instruction to help decrease symptoms of SOB with activities of daily living.    Expected Outcomes  Short Term: Improve cardiorespiratory fitness to achieve a reduction of symptoms when performing ADLs;Long Term: Be able to perform more ADLs without symptoms or delay the onset of symptoms       Core Components/Risk Factors/Patient Goals Review:  Goals and Risk Factor Review    Row Name 12/19/17 1320 01/17/18 1033 02/15/18 1245         Core Components/Risk Factors/Patient Goals Review   Personal Goals Review  Weight Management/Obesity;Improve shortness of breath with ADL's;Increase knowledge of respiratory medications and ability to use respiratory devices properly.;Develop more efficient breathing techniques such as purse lipped breathing and diaphragmatic breathing and practicing self-pacing with activity.  Weight Management/Obesity;Improve shortness of breath with ADL's;Increase knowledge of respiratory medications and ability to use respiratory devices properly.;Develop more efficient breathing techniques such as purse lipped breathing and diaphragmatic breathing and practicing self-pacing with activity.  Weight Management/Obesity;Improve shortness of breath with ADL's;Increase knowledge of respiratory medications and ability to use respiratory devices properly.;Develop more efficient breathing techniques such as purse lipped breathing and diaphragmatic breathing and practicing self-pacing with activity.     Review  -  Pt has completed 3 exercise sessions.  Unable to review pt progress toward pt goals.  Hopeful that pt will show progress toward pt goals in the next 30 day review.  Pt has completed 10 exercise sessions. Due to pt  chronic and multiple orthopedic challenges, pt is only able to to seated stepper for all three stations. Pt workloads maintain at level 1.  Pt not able to engage in any exercise or activity outside of rehab. Pt has lost 2.7 kg. Hopeful that pt will show measureable progress toward pt goals in the next 30 day review.     Expected Outcomes  -  See Admission Goals/Outcomes  See Admission Goals/Outcomes        Core Components/Risk Factors/Patient Goals at Discharge (Final Review):  Goals and Risk Factor Review - 02/15/18 1245      Core Components/Risk Factors/Patient Goals Review   Personal Goals Review  Weight Management/Obesity;Improve shortness of breath with ADL's;Increase knowledge of respiratory medications and ability to use respiratory devices properly.;Develop more efficient breathing techniques such as purse lipped breathing and diaphragmatic breathing and practicing self-pacing with activity.    Review  Pt has completed 10 exercise sessions. Due to pt chronic and multiple orthopedic challenges, pt is only able to to seated stepper for all three stations. Pt workloads maintain at level 1.  Pt not able to engage in any exercise or activity outside of rehab. Pt has lost 2.7 kg. Hopeful that pt will show measureable progress toward pt goals in the next  30 day review.    Expected Outcomes  See Admission Goals/Outcomes       ITP Comments: ITP Comments    Row Name 01/17/18 1033           ITP Comments  Dr. Jennet Maduro, Medical Director          Comments: Pt has completed 10 exercise sessions since 01/03/18. Cherre Huger, BSN Cardiac and Training and development officer

## 2018-02-16 ENCOUNTER — Encounter (HOSPITAL_COMMUNITY)
Admission: RE | Admit: 2018-02-16 | Discharge: 2018-02-16 | Disposition: A | Discharge status: Home / Self Care | Payer: Medicare Other | Source: Ambulatory Visit | Attending: Pulmonary Disease | Admitting: Pulmonary Disease

## 2018-02-16 DIAGNOSIS — J849 Interstitial pulmonary disease, unspecified: Secondary | ICD-10-CM | POA: Diagnosis not present

## 2018-02-16 NOTE — Telephone Encounter (Signed)
lmtcb for pt.  

## 2018-02-16 NOTE — Progress Notes (Signed)
Daily Session Note  Patient Details  Name: Kathy [name] MRN: [id number] Date of Birth: 1941-01-03 Referring Provider:     Pulmonary Rehab Walk Test from 12/22/2017 in [place]  Referring Provider  Dr. Halford Chessman      Encounter Date: 02/16/2018  Check In: Session Check In - 02/16/18 1419      Check-In   Supervising physician immediately available to respond to emergencies  [facility] immediately available    Physician(s)  Dr. Algis Liming    Location  [facility]    Staff Present  Maurice Small, RN, BSN;Molly DiVincenzo, MS, ACSM RCEP, Exercise Physiologist;Annedrea Rosezella [place], RN, MHA;Olinty [place], MS, ACSM CEP, Exercise Physiologist    Medication changes reported      No    Fall or balance concerns reported     No    Tobacco Cessation  No Change    Warm-up and Cool-down  Performed as group-led instruction    Resistance Training Performed  Yes    VAD Patient?  No    PAD/SET Patient?  No      Pain Assessment   Currently in Pain?  No/denies    Multiple Pain Sites  No       Capillary Blood Glucose: No results found for this or any previous visit (from the past 24 hour(s)).    Social History   Tobacco Use  Smoking Status Former Smoker  . Packs/day: 1.00  . Years: 29.00  . Pack years: 29.00  . Types: Cigarettes  . Last attempt to quit: 06/28/1986  . Years since quitting: 31.6  Smokeless Tobacco Never Used  Tobacco Comment   quit 40+ yrs ago, 1 PPD for a few years    Goals Met:  Exercise tolerated well No report of cardiac concerns or symptoms Strength training completed today  Goals Unmet:  Not Applicable  Comments: Service time is from 1330 to 1530    Dr. Rush Farmer is Medical Director for Pulmonary Rehab at [facility].

## 2018-02-20 NOTE — Telephone Encounter (Signed)
Attempted to call patient today regarding results. I have left a voicemail message for pt to return call. X3 We have left three messages for pt to call our office back regarding results. No response at this time, a letter has been placed in mail today for pt to call our office. Mailed letter of communication today. Nothing further needed at this time.    

## 2018-02-21 ENCOUNTER — Encounter (HOSPITAL_COMMUNITY)
Admission: RE | Admit: 2018-02-21 | Discharge: 2018-02-21 | Disposition: A | Discharge status: Home / Self Care | Payer: Medicare Other | Source: Ambulatory Visit | Attending: Pulmonary Disease | Admitting: Pulmonary Disease

## 2018-02-21 ENCOUNTER — Telehealth: Payer: Self-pay | Admitting: Pulmonary Disease

## 2018-02-21 DIAGNOSIS — J849 Interstitial pulmonary disease, unspecified: Secondary | ICD-10-CM

## 2018-02-21 NOTE — Progress Notes (Signed)
Daily Session Note  Patient Details  Name: Kathy [name] MRN: [id number] Date of Birth: [birth year] Referring Provider:     Pulmonary Rehab Walk Test from 12/22/2017 in [place]  Referring Provider  Dr. Halford Chessman      Encounter Date: 02/21/2018  Check In: Session Check In - 02/21/18 1545      Check-In   Supervising physician immediately available to respond to emergencies  [facility] immediately available    Physician(s)  Dr. Jonnie Finner    Location  [facility]    Staff Present  Maurice Small, RN, BSN;Molly DiVincenzo, MS, ACSM RCEP, Exercise Physiologist;Kaybree Williams Colletta [place], RN, MHA    Medication changes reported      No    Fall or balance concerns reported     No    Tobacco Cessation  No Change    Warm-up and Cool-down  Performed as group-led instruction    Resistance Training Performed  Yes    VAD Patient?  No    PAD/SET Patient?  No      Pain Assessment   Currently in Pain?  No/denies    Multiple Pain Sites  No       Capillary Blood Glucose: No results found for this or any previous visit (from the past 24 hour(s)).    Social History   Tobacco Use  Smoking Status Former Smoker  . Packs/day: 1.00  . Years: 29.00  . Pack years: 29.00  . Types: Cigarettes  . Last attempt to quit: 06/28/1986  . Years since quitting: 31.6  Smokeless Tobacco Never Used  Tobacco Comment   quit 40+ yrs ago, 1 PPD for a few years    Goals Met:  Exercise tolerated well No report of cardiac concerns or symptoms Strength training completed today  Goals Unmet:  Not Applicable  Comments: Service time is from 1330 to 1510    Dr. Rush Farmer is Medical Director for Pulmonary Rehab at [facility].

## 2018-02-21 NOTE — Telephone Encounter (Signed)
Patient made aware of results  per MD [facility] for Chest CT. Voiced understanding. Patient confirmed PFT appt in September. Nothing further needed at this time.

## 2018-02-23 ENCOUNTER — Encounter (HOSPITAL_COMMUNITY)
Admission: RE | Admit: 2018-02-23 | Discharge: 2018-02-23 | Disposition: A | Discharge status: Home / Self Care | Payer: Medicare Other | Source: Ambulatory Visit | Attending: Pulmonary Disease | Admitting: Pulmonary Disease

## 2018-02-23 VITALS — Wt 262.8 lb

## 2018-02-23 DIAGNOSIS — J849 Interstitial pulmonary disease, unspecified: Secondary | ICD-10-CM

## 2018-02-23 NOTE — Progress Notes (Signed)
Daily Session Note  Patient Details  Name: Kathy [name] MRN: [id number] Date of Birth: 02-11-1941 Referring Provider:     Pulmonary Rehab Walk Test from 12/22/2017 in [place]  Referring Provider  Dr. Halford Chessman      Encounter Date: 02/23/2018  Check In: Session Check In - 02/23/18 1324      Check-In   Supervising physician immediately available to respond to emergencies  [facility] immediately available    Physician(s)  Dr. Florene Glen    Location  [facility]    Staff Present  Maurice Small, RN, BSN;Kyriana Yankee, MS, ACSM RCEP, Exercise Physiologist;Annedrea Stackhouse, RN, [place]    Medication changes reported      No    Fall or balance concerns reported     No    Tobacco Cessation  No Change    Warm-up and Cool-down  Performed as group-led instruction    Resistance Training Performed  Yes    VAD Patient?  No    PAD/SET Patient?  No      Pain Assessment   Currently in Pain?  No/denies    Multiple Pain Sites  No       Capillary Blood Glucose: No results found for this or any previous visit (from the past 24 hour(s)).    Social History   Tobacco Use  Smoking Status Former Smoker  . Packs/day: 1.00  . Years: 29.00  . Pack years: 29.00  . Types: Cigarettes  . Last attempt to quit: 06/28/1986  . Years since quitting: 31.6  Smokeless Tobacco Never Used  Tobacco Comment   quit 40+ yrs ago, 1 PPD for a few years    Goals Met:  Exercise tolerated well  Goals Unmet:  Not Applicable  Comments: Service time is from 1:30p to 3:45p    Dr. Rush Farmer is Medical Director for Pulmonary Rehab at [facility].

## 2018-02-28 ENCOUNTER — Encounter (HOSPITAL_COMMUNITY): Payer: Medicare Other

## 2018-02-28 ENCOUNTER — Telehealth: Payer: Self-pay | Admitting: Pulmonary Disease

## 2018-02-28 NOTE — Telephone Encounter (Signed)
Spoke with patient, patient states she received a letter and was calling to get lab results. Informed patient of CT results per MD Sood. Confirmed follow up appointment. Voiced understanding. Nothing further needed at this time.

## 2018-03-02 ENCOUNTER — Encounter (HOSPITAL_COMMUNITY)
Admission: RE | Admit: 2018-03-02 | Discharge: 2018-03-02 | Disposition: A | Discharge status: Home / Self Care | Payer: Medicare Other | Source: Ambulatory Visit | Attending: Pulmonary Disease | Admitting: Pulmonary Disease

## 2018-03-02 DIAGNOSIS — J849 Interstitial pulmonary disease, unspecified: Secondary | ICD-10-CM | POA: Insufficient documentation

## 2018-03-02 NOTE — Progress Notes (Signed)
Daily Session Note  Patient Details  Name: Anna-Marie Coller MRN: [id number] Date of Birth: Sep 12, 1940 Referring Provider:     Pulmonary Rehab Walk Test from 12/22/2017 in [place]  Referring Provider  Dr. Halford Chessman      Encounter Date: 03/02/2018  Check In: Session Check In - 03/02/18 [place]      Check-In   Supervising physician immediately available to respond to emergencies  [facility] immediately available    Physician(s)  Dr. Lars Mage     Location  [facility]    Staff Present  Rodney Langton, RN;Carlette Wilber Oliphant, RN, BSN;Ramon Dredge, RN, MHA;Joann Rion, RN, BSN    Medication changes reported      No    Fall or balance concerns reported     No    Tobacco Cessation  No Change    Warm-up and Cool-down  Performed as group-led Higher education careers adviser Performed  Yes    VAD Patient?  No    PAD/SET Patient?  No      Pain Assessment   Currently in Pain?  No/denies       Capillary Blood Glucose: No results found for this or any previous visit (from the past 24 hour(s)).    Social History   Tobacco Use  Smoking Status Former Smoker  . Packs/day: 1.00  . Years: 29.00  . Pack years: 29.00  . Types: Cigarettes  . Last attempt to quit: 06/28/1986  . Years since quitting: 31.6  Smokeless Tobacco Never Used  Tobacco Comment   quit 40+ yrs ago, 1 PPD for a few years    Goals Met:  Exercise tolerated well No report of cardiac concerns or symptoms Strength training completed today  Goals Unmet:  Not Applicable  Comments: Service time is from 1330 to 1505    Dr. Rush Farmer is Medical Director for Pulmonary Rehab at [facility].

## 2018-03-07 ENCOUNTER — Encounter (HOSPITAL_COMMUNITY): Payer: Medicare Other

## 2018-03-07 ENCOUNTER — Telehealth (HOSPITAL_COMMUNITY): Payer: Self-pay | Admitting: Internal Medicine

## 2018-03-09 ENCOUNTER — Encounter (HOSPITAL_COMMUNITY)
Admission: RE | Admit: 2018-03-09 | Discharge: 2018-03-09 | Disposition: A | Discharge status: Home / Self Care | Payer: Medicare Other | Source: Ambulatory Visit | Attending: Pulmonary Disease | Admitting: Pulmonary Disease

## 2018-03-09 DIAGNOSIS — J849 Interstitial pulmonary disease, unspecified: Secondary | ICD-10-CM

## 2018-03-09 NOTE — Progress Notes (Signed)
Daily Session Note  Patient Details  Name: Kathy [name] MRN: [id number] Date of Birth: [birth year] Referring Provider:     Pulmonary Rehab Walk Test from 12/22/2017 in [place]  Referring Provider  Dr. Halford Chessman      Encounter Date: 03/09/2018  Check In: Session Check In - 03/09/18 1523      Check-In   Supervising physician immediately available to respond to emergencies  [facility] immediately available    Physician(s)  Dr. Dyann Kief    Location  [facility]    Staff Present  Maurice Small, RN, BSN;Molly DiVincenzo, MS, ACSM RCEP, Exercise Physiologist;Rhena Glace Ysidro Evert, Felipe Drone, RN, MHA    Medication changes reported      No    Fall or balance concerns reported     No    Tobacco Cessation  No Change    Warm-up and Cool-down  Performed as group-led instruction    Resistance Training Performed  Yes    VAD Patient?  No    PAD/SET Patient?  No      Pain Assessment   Currently in Pain?  No/denies    Multiple Pain Sites  No       Capillary Blood Glucose: No results found for this or any previous visit (from the past 24 hour(s)).    Social History   Tobacco Use  Smoking Status Former Smoker  . Packs/day: 1.00  . Years: 29.00  . Pack years: 29.00  . Types: Cigarettes  . Last attempt to quit: 06/28/1986  . Years since quitting: 31.7  Smokeless Tobacco Never Used  Tobacco Comment   quit 40+ yrs ago, 1 PPD for a few years    Goals Met:  Exercise tolerated well No report of cardiac concerns or symptoms Strength training completed today  Goals Unmet:  Not Applicable  Comments: Service time is from 1330 to 1500    Dr. Rush Farmer is Medical Director for Pulmonary Rehab at [facility].

## 2018-03-14 ENCOUNTER — Ambulatory Visit (INDEPENDENT_AMBULATORY_CARE_PROVIDER_SITE_OTHER): Payer: Medicare Other | Admitting: Pulmonary Disease

## 2018-03-14 ENCOUNTER — Encounter: Payer: Self-pay | Admitting: Pulmonary Disease

## 2018-03-14 ENCOUNTER — Encounter (HOSPITAL_COMMUNITY)
Admission: RE | Admit: 2018-03-14 | Discharge: 2018-03-14 | Disposition: A | Discharge status: Home / Self Care | Payer: Medicare Other | Source: Ambulatory Visit | Attending: Pulmonary Disease | Admitting: Pulmonary Disease

## 2018-03-14 VITALS — BP 140/86 | HR 83 | Ht 60.0 in | Wt 263.4 lb

## 2018-03-14 VITALS — Wt 268.3 lb

## 2018-03-14 DIAGNOSIS — J849 Interstitial pulmonary disease, unspecified: Secondary | ICD-10-CM | POA: Diagnosis not present

## 2018-03-14 DIAGNOSIS — Z6841 Body Mass Index (BMI) 40.0 and over, adult: Secondary | ICD-10-CM

## 2018-03-14 DIAGNOSIS — Z23 Encounter for immunization: Secondary | ICD-10-CM | POA: Diagnosis not present

## 2018-03-14 DIAGNOSIS — J9611 Chronic respiratory failure with hypoxia: Secondary | ICD-10-CM | POA: Diagnosis not present

## 2018-03-14 DIAGNOSIS — J8489 Other specified interstitial pulmonary diseases: Secondary | ICD-10-CM | POA: Diagnosis not present

## 2018-03-14 LAB — PULMONARY FUNCTION TEST
DL/VA % PRED: 119 %
DL/VA: 5.05 ml/min/mmHg/L
DLCO unc % pred: 59 %
DLCO unc: 11.23 ml/min/mmHg
FEF 25-75 POST: 2.31 L/s
FEF 25-75 PRE: 2.23 L/s
FEF2575-%Change-Post: 3 %
FEF2575-%PRED-PRE: 168 %
FEF2575-%Pred-Post: 174 %
FEV1-%Change-Post: 0 %
FEV1-%PRED-PRE: 76 %
FEV1-%Pred-Post: 75 %
FEV1-PRE: 1.28 L
FEV1-Post: 1.27 L
FEV1FVC-%CHANGE-POST: 2 %
FEV1FVC-%Pred-Pre: 123 %
FEV6-%CHANGE-POST: -3 %
FEV6-%PRED-POST: 63 %
FEV6-%PRED-PRE: 65 %
FEV6-PRE: 1.4 L
FEV6-Post: 1.35 L
FEV6FVC-%PRED-PRE: 106 %
FEV6FVC-%Pred-Post: 106 %
FVC-%CHANGE-POST: -3 %
FVC-%PRED-POST: 60 %
FVC-%Pred-Pre: 62 %
FVC-Post: 1.35 L
FVC-Pre: 1.4 L
POST FEV6/FVC RATIO: 100 %
PRE FEV1/FVC RATIO: 91 %
Post FEV1/FVC ratio: 94 %
Pre FEV6/FVC Ratio: 100 %

## 2018-03-14 NOTE — Patient Instructions (Signed)
Change prednisone to 10 mg per day (1/2 pill per day)  High dose flu shot today  Follow up in 2 months

## 2018-03-14 NOTE — Progress Notes (Signed)
Daily Session Note  Patient Details  Name: Kathy [name] MRN: [id number] Date of Birth: 23-Mar-1941 Referring Provider:     Pulmonary Rehab Walk Test from 12/22/2017 in [place]  Referring Provider  Dr. Halford Chessman      Encounter Date: 03/14/2018  Check In: Session Check In - 03/14/18 1541      Check-In   Supervising physician immediately available to respond to emergencies  [facility] immediately available    Physician(s)  Dr. Sloan Leiter    Location  [facility]    Staff Present  Maurice Small, RN, BSN;Molly DiVincenzo, MS, ACSM RCEP, Exercise Physiologist;Necola Bluestein Ysidro Evert, Felipe Drone, RN, MHA    Medication changes reported      No    Fall or balance concerns reported     No    Tobacco Cessation  No Change    Warm-up and Cool-down  Performed as group-led instruction    Resistance Training Performed  Yes    VAD Patient?  No    PAD/SET Patient?  No      Pain Assessment   Currently in Pain?  No/denies    Multiple Pain Sites  Yes       Capillary Blood Glucose: No results found for this or any previous visit (from the past 24 hour(s)).  Exercise Prescription Changes - 03/14/18 1600      Response to Exercise   Blood Pressure (Admit)  94/50    Blood Pressure (Exercise)  108/64    Blood Pressure (Exit)  110/70    Heart Rate (Admit)  86 bpm    Heart Rate (Exercise)  76 bpm    Heart Rate (Exit)  60 bpm    Oxygen Saturation (Admit)  98 %    Oxygen Saturation (Exercise)  96 %    Oxygen Saturation (Exit)  97 %    Rating of Perceived Exertion (Exercise)  13    Duration  Progress to 45 minutes of aerobic exercise without signs/symptoms of physical distress    Intensity  THRR unchanged      Progression   Progression  Continue to progress workloads to maintain intensity without signs/symptoms of physical distress.      Resistance Training   Training Prescription  Yes    Weight  orange bands    Reps  10-15    Time  10  Minutes      Interval Training   Interval Training  No      Oxygen   Oxygen  Continuous    Liters  2      NuStep   Level  2    SPM  80    Minutes  51    METs  1.4       Social History   Tobacco Use  Smoking Status Former Smoker  . Packs/day: 1.00  . Years: 29.00  . Pack years: 29.00  . Types: Cigarettes  . Last attempt to quit: 06/28/1986  . Years since quitting: 31.7  Smokeless Tobacco Never Used  Tobacco Comment   quit 40+ yrs ago, 1 PPD for a few years    Goals Met:  Exercise tolerated well No report of cardiac concerns or symptoms Strength training completed today  Goals Unmet:  Not Applicable  Comments: Service time is from 1330 to 1515    Dr. Rush Farmer is Medical Director for Pulmonary Rehab at [facility].

## 2018-03-14 NOTE — Progress Notes (Signed)
[facility]  Chief Complaint  Patient presents with  . Follow-up    Bronchiectasis: Pt notes she gets winded with walking. Pt completed PFT prior to OV today. Pt states when eating, hours later she feels the food is staying in her throat that it is not going down at all.      Constitutional: BP 140/86 (BP Location: Left Arm, Cuff Size: Normal)   Pulse 83   Ht 5' (1.524 m)   Wt 263 lb 6.4 oz (119.5 kg)   SpO2 97%   BMI 51.44 kg/m   History of Present Illness: Kathy [name] is a 77 y.o. 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  Breathing better since restarting prednisone.  Not having as much cough.  Not having wheeze, sputum, hemoptysis.  Knee pain better.  Has more bruising and facial swelling since starting prednisone.  Has noticed feeling like food is stuck at times in her throat, but only after she is done eating.  PFT today shows progression of diffusion defect.  She was unable to do lung volume testing.  Comprehensive Respiratory Exam:  Appearance - wearing oxygen ENMT - nasal mucosa moist, turbinates clear, midline nasal septum, no dental lesions, no gingival bleeding, no oral exudates, no tonsillar hypertrophy Neck - no masses, trachea midline, no thyromegaly, no elevation in JVP Respiratory - normal appearance of chest wall, normal respiratory effort w/o accessory muscle use, no dullness on percussion, no wheezing or rales CV - s1s2 regular rate and rhythm, no murmurs, 1+ peripheral edema, radial pulses symmetric GI - soft, non tender, no masses Lymph - no adenopathy noted in neck and axillary areas MSK - normal muscle strength and tone, normal gait Ext - no cyanosis, clubbing, or joint inflammation noted Skin - no rashes, lesions, or ulcers Neuro - oriented to person, place, and time Psych - normal mood and affect   Assessment/Plan:  ILD with NSIP pattern. 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 - continue 2 liters oxygen 24/7  Muscular deconditioning, obesity. - continue pulmonary rehab   Patient Instructions  Change prednisone to 10 mg per day (1/2 pill per day)  High dose flu shot today  Follow up in 2 months    Chesley Mires, MD [place] 03/14/2018, 11:26 AM Pager:  [phone]  Flow Sheet  Pulmonary tests: Serology 03/01/17 >> ANA negative, RF < 14, CCP < 16, ANCA negative, SSA/SSB < 0.2 PFT 03/15/17 >> FEV1 1.40 (82%), FEV1% 96, TLC 2.86 (64%), DLCO 70% HP panel 03/15/17 >> negative Aspergillus IgE Panel 07/20/17 >> negative IgE 07/20/17 >> 44 PFT 03/14/18 >> FEV1 1.28 (76%), FEV1% 91, DLCO 59%  Chest imaging: HRCT chest 02/22/17 >> atherosclerosis, 3 mm LUL nodule, patchy air trapping b/l, patchy reticulation and GGO b/l, minimal traction BTX HRCT chest 01/12/18 >> 3 mm nodule Lt apex, patchy air trapping, patchy subpleural reticulation and GGO, mild traction BTX  Cardiac tests: Echo 03/21/17 >> EF 65 to 70%, grade 1 DD  Past Medical History: She  has a past medical history of Arthritis, CKD (chronic kidney disease) stage 3, GFR 30-59 ml/min (HCC) (11/30/2017), Depression, Dizziness, Dysrhythmia, Endometrial ca ([place]) (11/30/2017), GERD (gastroesophageal reflux disease) (11/30/2017), HLD (hyperlipidemia) (  11/30/2017), Hypercholesteremia, Hypertension, Neuropathy, Shortness of breath dyspnea, Sleep apnea, and Umbilical hernia.  Past Surgical History: She  has a past surgical history that includes Tonsillectomy; Cholecystectomy; Abdominal hysterectomy; Hernia repair; Knee arthroscopy (Bilateral); Hammer toe surgery; Brow lift (Bilateral, 07/15/2015); and Ptosis repair (Bilateral, 07/15/2015).  Family History: Her family history  includes Congestive Heart Failure in her mother; Parkinson's disease in her father; Stroke in her father.  Social History: She  reports that she quit smoking about 31 years ago. Her smoking use included cigarettes. She has a 29.00 pack-year smoking history. She has never used smokeless tobacco. She reports that she does not drink alcohol or use drugs.  Medications: Allergies as of 03/14/2018      Reactions   Lipitor [atorvastatin] Other (See Comments)   Memory issues   Requip [ropinirole Hcl] Other (See Comments)   Pt reports feeling generally unwell on this medication      Medication List        Accurate as of 03/14/18 11:26 AM. Always use your most recent med list.          albuterol 108 (90 Base) MCG/ACT inhaler Commonly known as:  PROVENTIL HFA;VENTOLIN HFA Inhale 1-2 puffs into the lungs every 6 (six) hours as needed for wheezing or shortness of breath.   benzonatate 100 MG capsule Commonly known as:  TESSALON Take 1 capsule (100 mg total) by mouth 2 (two) times daily as needed for cough.   etodolac 300 MG capsule Commonly known as:  LODINE Take 300 mg by mouth 2 (two) times daily.   fenofibrate micronized 134 MG capsule Commonly known as:  LOFIBRA Take 134 mg by mouth daily before breakfast.   furosemide 40 MG tablet Commonly known as:  LASIX 1 tab by mouth in the AM, and 1 tab by mouth in the PM as needed for persistent swelling or weight gain more than 3-5 lbs   losartan 100 MG tablet Commonly known as:  COZAAR Take 50 mg by mouth daily.   metoprolol succinate 50 MG 24 hr tablet Commonly known as:  TOPROL-XL Take 1 tablet (50 mg total) by mouth daily.   predniSONE 20 MG tablet Commonly known as:  DELTASONE Take 1 tablet (20 mg total) by mouth daily with breakfast.   venlafaxine XR 150 MG 24 hr capsule Commonly known as:  EFFEXOR-XR Take 1 capsule (150 mg total) by mouth daily.

## 2018-03-14 NOTE — Progress Notes (Signed)
PFT completed today.Kathy [name],CMA  

## 2018-03-16 ENCOUNTER — Encounter (HOSPITAL_COMMUNITY)
Admission: RE | Admit: 2018-03-16 | Discharge: 2018-03-16 | Disposition: A | Discharge status: Home / Self Care | Payer: Medicare Other | Source: Ambulatory Visit | Attending: Pulmonary Disease | Admitting: Pulmonary Disease

## 2018-03-16 DIAGNOSIS — J849 Interstitial pulmonary disease, unspecified: Secondary | ICD-10-CM

## 2018-03-16 NOTE — Progress Notes (Signed)
Pulmonary Individual Treatment Plan  Patient Details  Name: Satori Krabill MRN: [id number] Date of Birth: [birth year] Referring Provider:     Pulmonary Rehab Walk Test from 12/22/2017 in [facility]  Referring Provider  Dr. Halford Chessman      Initial Encounter Date:    Pulmonary Rehab Walk Test from 12/22/2017 in [facility]  Date  12/26/17      Visit Diagnosis: Interstitial lung disease ([place])  Patient's Home Medications on Admission:   Current Outpatient Medications:  .  albuterol (PROVENTIL HFA;VENTOLIN HFA) 108 (90 Base) MCG/ACT inhaler, Inhale 1-2 puffs into the lungs every 6 (six) hours as needed for wheezing or shortness of breath., Disp: 1 Inhaler, Rfl: 2 .  benzonatate (TESSALON PERLES) 100 MG capsule, Take 1 capsule (100 mg total) by mouth 2 (two) times daily as needed for cough., Disp: 60 capsule, Rfl: 5 .  etodolac (LODINE) 300 MG capsule, Take 300 mg by mouth 2 (two) times daily., Disp: , Rfl:  .  fenofibrate micronized (LOFIBRA) 134 MG capsule, Take 134 mg by mouth daily before breakfast., Disp: , Rfl:  .  furosemide (LASIX) 40 MG tablet, 1 tab by mouth in the AM, and 1 tab by mouth in the PM as needed for persistent swelling or weight gain more than 3-5 lbs, Disp: 60 tablet, Rfl: 11 .  losartan (COZAAR) 100 MG tablet, Take 50 mg by mouth daily., Disp: , Rfl:  .  metoprolol succinate (TOPROL-XL) 50 MG 24 hr tablet, Take 1 tablet (50 mg total) by mouth daily., Disp: 90 tablet, Rfl: 1 .  predniSONE (DELTASONE) 20 MG tablet, Take 1 tablet (20 mg total) by mouth daily with breakfast., Disp: 30 tablet, Rfl: 5 .  venlafaxine XR (EFFEXOR-XR) 150 MG 24 hr capsule, Take 1 capsule (150 mg total) by mouth daily., Disp: 90 capsule, Rfl: 3  Past Medical History: Past Medical History:  Diagnosis Date  . Arthritis    fingers  . CKD (chronic kidney disease) stage 3, GFR 30-59 ml/min (HCC) 11/30/2017  . Depression   . Dizziness    in AM,  getting out of bed  . Dysrhythmia    "skips a beat" sometimes - followed by PCP  . Endometrial ca ([place]) 11/30/2017   S/p surgury 1990's  . GERD (gastroesophageal reflux disease) 11/30/2017  . HLD (hyperlipidemia) 11/30/2017  . Hypercholesteremia   . Hypertension   . Neuropathy    bilateral feet  . Shortness of breath dyspnea   . Sleep apnea    has CPAP, doesn't use  . Umbilical hernia     Tobacco Use: Social History   Tobacco Use  Smoking Status Former Smoker  . Packs/day: 1.00  . Years: 29.00  . Pack years: 29.00  . Types: Cigarettes  . Last attempt to quit: 06/28/1986  . Years since quitting: 31.7  Smokeless Tobacco Never Used  Tobacco Comment   quit 40+ yrs ago, 1 PPD for a few years    Labs: Recent Review Flowsheet Data    Labs for ITP Cardiac and Pulmonary Rehab Latest Ref Rng & Units 05/13/2008 07/31/2014 06/27/2016 11/30/2017   Cholestrol 0 - 200 mg/dL - - - 223(H)   LDLCALC 0 - 99 mg/dL - - - 127(H)   HDL >39.00 mg/dL - - - 74.40   Trlycerides 0.0 - 149.0 mg/dL - - - 106.0   TCO2 0 - 100 mmol/L '28 25 29 ' -      Capillary Blood Glucose:  Lab Results  Component Value Date   GLUCAP 134 (H) 06/29/2016     Pulmonary Assessment Scores: Pulmonary Assessment Scores    Row Name 12/26/17 0905 01/05/18 1543       ADL UCSD   ADL Phase  Entry  -      CAT Score   CAT Score  -  29      mMRC Score   mMRC Score  4  -       Pulmonary Function Assessment: Pulmonary Function Assessment - 12/19/17 1307      Breath   Bilateral Breath Sounds  Clear    Shortness of Breath  Limiting activity;Yes       Exercise Target Goals: Exercise Program Goal: Individual exercise prescription set using results from initial 6 min walk test and THRR while considering  patient's activity barriers and safety.   Exercise Prescription Goal: Initial exercise prescription builds to 30-45 minutes a day of aerobic activity, 2-3 days per week.  Home exercise guidelines will be given to  patient during program as part of exercise prescription that the participant will acknowledge.  Activity Barriers & Risk Stratification: Activity Barriers & Cardiac Risk Stratification - 12/19/17 1313      Activity Barriers & Cardiac Risk Stratification   Activity Barriers  Back Problems;Shortness of Breath;Joint Problems;Arthritis       6 Minute Walk: 6 Minute Walk    Row Name 12/26/17 0905         6 Minute Walk   Phase  Initial     Distance  500 feet     Walk Time  - 4 minutes and 9 seconds     # of Rest Breaks  - 1 minute 51 seconds in total     MPH  0.94     METS  1.77     RPE  14     Perceived Dyspnea   3     Symptoms  Yes (comment)     Comments  7/10 lower back pain     Resting HR  97 bpm     Resting BP  100/52     Resting Oxygen Saturation   96 %     Exercise Oxygen Saturation  during 6 min walk  91 %     Max Ex. HR  125 bpm     Max Ex. BP  138/48       Interval HR   1 Minute HR  118     2 Minute HR  125     3 Minute HR  122     4 Minute HR  119     5 Minute HR  125     6 Minute HR  122     2 Minute Post HR  104     Interval Heart Rate?  Yes       Interval Oxygen   Interval Oxygen?  Yes     Baseline Oxygen Saturation %  96 %     1 Minute Oxygen Saturation %  93 %     1 Minute Liters of Oxygen  2 L     2 Minute Oxygen Saturation %  93 %     2 Minute Liters of Oxygen  2 L     3 Minute Oxygen Saturation %  94 %     3 Minute Liters of Oxygen  2 L     4 Minute Oxygen Saturation %  92 %  4 Minute Liters of Oxygen  2 L     5 Minute Oxygen Saturation %  91 %     5 Minute Liters of Oxygen  2 L     6 Minute Oxygen Saturation %  91 %     6 Minute Liters of Oxygen  2 L     2 Minute Post Oxygen Saturation %  95 %     2 Minute Post Liters of Oxygen  2 L        Oxygen Initial Assessment: Oxygen Initial Assessment - 12/19/17 1312      Home Oxygen   Home Oxygen Device  Home Concentrator;Portable Concentrator    Sleep Oxygen Prescription  Continuous     Liters per minute  2    Home Exercise Oxygen Prescription  Continuous    Liters per minute  2    Home at Rest Exercise Oxygen Prescription  Continuous    Liters per minute  2    Compliance with Home Oxygen Use  Yes       Oxygen Re-Evaluation: Oxygen Re-Evaluation    Row Name 12/26/17 0904 01/16/18 1230 02/14/18 0713 03/14/18 0909       Program Oxygen Prescription   Program Oxygen Prescription  Continuous;E-Tanks  Continuous;E-Tanks  Continuous;E-Tanks  Continuous;E-Tanks    Liters per minute  '2  2  2  2      ' Home Oxygen   Home Oxygen Device  Home Concentrator;Portable Concentrator  Home Concentrator;Portable Concentrator  Home Concentrator;Portable Concentrator  Home Concentrator;Portable Concentrator    Sleep Oxygen Prescription  Continuous  Continuous  Continuous  Continuous    Liters per minute  '2  2  2  2    ' Home Exercise Oxygen Prescription  Continuous  Continuous  Continuous  Continuous    Liters per minute  -  '2  2  2    ' Home at Rest Exercise Oxygen Prescription  Continuous  Continuous  Continuous  Continuous    Liters per minute  '2  2  2  2    ' Compliance with Home Oxygen Use  Yes  Yes  Yes  Yes      Goals/Expected Outcomes   Short Term Goals  To learn and exhibit compliance with exercise, home and travel O2 prescription;To learn and understand importance of monitoring SPO2 with pulse oximeter and demonstrate accurate use of the pulse oximeter.;To learn and understand importance of maintaining oxygen saturations>88%;To learn and demonstrate proper pursed lip breathing techniques or other breathing techniques.;To learn and demonstrate proper use of respiratory medications  To learn and exhibit compliance with exercise, home and travel O2 prescription;To learn and understand importance of monitoring SPO2 with pulse oximeter and demonstrate accurate use of the pulse oximeter.;To learn and understand importance of maintaining oxygen saturations>88%;To learn and demonstrate proper  pursed lip breathing techniques or other breathing techniques.;To learn and demonstrate proper use of respiratory medications  To learn and exhibit compliance with exercise, home and travel O2 prescription;To learn and understand importance of monitoring SPO2 with pulse oximeter and demonstrate accurate use of the pulse oximeter.;To learn and understand importance of maintaining oxygen saturations>88%;To learn and demonstrate proper pursed lip breathing techniques or other breathing techniques.;To learn and demonstrate proper use of respiratory medications  To learn and exhibit compliance with exercise, home and travel O2 prescription;To learn and understand importance of monitoring SPO2 with pulse oximeter and demonstrate accurate use of the pulse oximeter.;To learn and understand importance of maintaining oxygen saturations>88%;To learn and demonstrate  proper pursed lip breathing techniques or other breathing techniques.;To learn and demonstrate proper use of respiratory medications    Long  Term Goals  Exhibits compliance with exercise, home and travel O2 prescription;Verbalizes importance of monitoring SPO2 with pulse oximeter and return demonstration;Maintenance of O2 saturations>88%;Exhibits proper breathing techniques, such as pursed lip breathing or other method taught during program session;Compliance with respiratory medication;Demonstrates proper use of MDI's  Exhibits compliance with exercise, home and travel O2 prescription;Verbalizes importance of monitoring SPO2 with pulse oximeter and return demonstration;Maintenance of O2 saturations>88%;Exhibits proper breathing techniques, such as pursed lip breathing or other method taught during program session;Compliance with respiratory medication;Demonstrates proper use of MDI's  Exhibits compliance with exercise, home and travel O2 prescription;Verbalizes importance of monitoring SPO2 with pulse oximeter and return demonstration;Maintenance of O2  saturations>88%;Exhibits proper breathing techniques, such as pursed lip breathing or other method taught during program session;Compliance with respiratory medication;Demonstrates proper use of MDI's  -    Goals/Expected Outcomes  compliance and understanding  compliance and understanding  compliance and understanding  compliance and understanding       Oxygen Discharge (Final Oxygen Re-Evaluation): Oxygen Re-Evaluation - 03/14/18 0909      Program Oxygen Prescription   Program Oxygen Prescription  Continuous;E-Tanks    Liters per minute  2      Home Oxygen   Home Oxygen Device  Home Concentrator;Portable Concentrator    Sleep Oxygen Prescription  Continuous    Liters per minute  2    Home Exercise Oxygen Prescription  Continuous    Liters per minute  2    Home at Rest Exercise Oxygen Prescription  Continuous    Liters per minute  2    Compliance with Home Oxygen Use  Yes      Goals/Expected Outcomes   Short Term Goals  To learn and exhibit compliance with exercise, home and travel O2 prescription;To learn and understand importance of monitoring SPO2 with pulse oximeter and demonstrate accurate use of the pulse oximeter.;To learn and understand importance of maintaining oxygen saturations>88%;To learn and demonstrate proper pursed lip breathing techniques or other breathing techniques.;To learn and demonstrate proper use of respiratory medications    Goals/Expected Outcomes  compliance and understanding       Initial Exercise Prescription: Initial Exercise Prescription - 12/26/17 0900      Date of Initial Exercise RX and Referring Provider   Date  12/26/17    Referring Provider  Dr. Halford Chessman      Oxygen   Oxygen  Continuous    Liters  2      NuStep   Level  1    SPM  80    Minutes  17      Prescription Details   Frequency (times per week)  2    Duration  Progress to 45 minutes of aerobic exercise without signs/symptoms of physical distress      Intensity   THRR 40-80%  of Max Heartrate  57-114    Ratings of Perceived Exertion  11-13    Perceived Dyspnea  0-4      Progression   Progression  Continue progressive overload as per policy without signs/symptoms or physical distress.      Resistance Training   Training Prescription  Yes    Weight  orange bands    Reps  10-15       Perform Capillary Blood Glucose checks as needed.  Exercise Prescription Changes: Exercise Prescription Changes    Row Name 01/03/18 1500 01/17/18  1500 01/31/18 1800 02/14/18 1600 02/23/18 0906     Response to Exercise   Blood Pressure (Admit)  150/74  133/90  124/78  150/80  102/72   Blood Pressure (Exercise)  148/78  144/80  130/70  108/72  128/60   Blood Pressure (Exit)  148/80  130/66  114/70  124/72  104/64   Heart Rate (Admit)  88 bpm  84 bpm  82 bpm  88 bpm  61 bpm   Heart Rate (Exercise)  83 bpm  78 bpm  76 bpm  59 bpm  80 bpm   Heart Rate (Exit)  85 bpm  65 bpm  69 bpm  61 bpm  76 bpm   Oxygen Saturation (Admit)  95 %  97 %  96 %  97 %  98 %   Oxygen Saturation (Exercise)  95 %  94 %  95 %  94 %  96 %   Oxygen Saturation (Exit)  95 %  95 %  97 %  93 %  97 %   Rating of Perceived Exertion (Exercise)  '13  12  13  13  13   ' Perceived Dyspnea (Exercise)  '1  1  1  2  2   ' Duration  Progress to 45 minutes of aerobic exercise without signs/symptoms of physical distress  Progress to 45 minutes of aerobic exercise without signs/symptoms of physical distress  Progress to 45 minutes of aerobic exercise without signs/symptoms of physical distress  Progress to 45 minutes of aerobic exercise without signs/symptoms of physical distress  Progress to 45 minutes of aerobic exercise without signs/symptoms of physical distress   Intensity  - 40- 80% HRR  THRR unchanged  THRR unchanged  THRR unchanged  THRR unchanged     Progression   Progression  Continue to progress workloads to maintain intensity without signs/symptoms of physical distress.  -  Continue to progress workloads to maintain  intensity without signs/symptoms of physical distress.  Continue to progress workloads to maintain intensity without signs/symptoms of physical distress.  Continue to progress workloads to maintain intensity without signs/symptoms of physical distress.     Resistance Training   Training Prescription  Yes  Yes  Yes  Yes  Yes   Weight  orange bands  orange bands  orange bands  orange bands  orange bands   Reps  10-15  10-15  10-15  10-15  10-15   Time  10 Minutes  10 Minutes  10 Minutes  10 Minutes  10 Minutes     Interval Training   Interval Training  No  No  No  No  No     Oxygen   Oxygen  Continuous  Continuous  Continuous  Continuous  Continuous   Liters  '2  2  2  2  2     ' NuStep   Level  '2  1  1  1  2   ' SPM  80  80  80  80  80   Minutes  51  51  34  45  34   METs  1.5  1.6  1.3  1.3  1.5   Row Name 03/14/18 1600             Response to Exercise   Blood Pressure (Admit)  94/50       Blood Pressure (Exercise)  108/64       Blood Pressure (Exit)  110/70       Heart Rate (Admit)  86  bpm       Heart Rate (Exercise)  76 bpm       Heart Rate (Exit)  60 bpm       Oxygen Saturation (Admit)  98 %       Oxygen Saturation (Exercise)  96 %       Oxygen Saturation (Exit)  97 %       Rating of Perceived Exertion (Exercise)  13       Duration  Progress to 45 minutes of aerobic exercise without signs/symptoms of physical distress       Intensity  THRR unchanged         Progression   Progression  Continue to progress workloads to maintain intensity without signs/symptoms of physical distress.         Resistance Training   Training Prescription  Yes       Weight  orange bands       Reps  10-15       Time  10 Minutes         Interval Training   Interval Training  No         Oxygen   Oxygen  Continuous       Liters  2         NuStep   Level  2       SPM  80       Minutes  51       METs  1.4          Exercise Comments:   Exercise Goals and Review:   Exercise Goals  Re-Evaluation : Exercise Goals Re-Evaluation    Row Name 01/16/18 1231 02/14/18 0713 03/14/18 0909         Exercise Goal Re-Evaluation   Exercise Goals Review  Increase Physical Activity;Able to understand and use rate of perceived exertion (RPE) scale;Knowledge and understanding of Target Heart Rate Range (THRR);Understanding of Exercise Prescription;Increase Strength and Stamina;Able to understand and use Dyspnea scale  Increase Physical Activity;Able to understand and use rate of perceived exertion (RPE) scale;Knowledge and understanding of Target Heart Rate Range (THRR);Understanding of Exercise Prescription;Increase Strength and Stamina;Able to understand and use Dyspnea scale  Increase Physical Activity;Able to understand and use rate of perceived exertion (RPE) scale;Knowledge and understanding of Target Heart Rate Range (THRR);Understanding of Exercise Prescription;Increase Strength and Stamina;Able to understand and use Dyspnea scale     Comments  Patient has only attended three rehab sessions. Will cont. to monitor and progress as able.   Patient is making slow and steady progress. MET average places her in a low level. Mostly non-weight exercise. Will cont to progress and motivate.  Patient's progress is slow. MET average places her in a low level 1.4. Has to be encouraged to walk. Can complete 5 laps (200 ft each) in 15 minutes. Will encourage home exercise.      Expected Outcomes  Through rehab and exercising at home, patient will find it easier to carry out ADL's and will gain the confidence to establish an exercise routine at home.   Through rehab and exercising at home, patient will find it easier to carry out ADL's and will gain the confidence to establish an exercise routine at home.   Through rehab and exercising at home, patient will find it easier to carry out ADL's and will gain the confidence to establish an exercise routine at home.         Discharge Exercise Prescription (Final  Exercise Prescription Changes):  Exercise Prescription Changes - 03/14/18 1600      Response to Exercise   Blood Pressure (Admit)  94/50    Blood Pressure (Exercise)  108/64    Blood Pressure (Exit)  110/70    Heart Rate (Admit)  86 bpm    Heart Rate (Exercise)  76 bpm    Heart Rate (Exit)  60 bpm    Oxygen Saturation (Admit)  98 %    Oxygen Saturation (Exercise)  96 %    Oxygen Saturation (Exit)  97 %    Rating of Perceived Exertion (Exercise)  13    Duration  Progress to 45 minutes of aerobic exercise without signs/symptoms of physical distress    Intensity  THRR unchanged      Progression   Progression  Continue to progress workloads to maintain intensity without signs/symptoms of physical distress.      Resistance Training   Training Prescription  Yes    Weight  orange bands    Reps  10-15    Time  10 Minutes      Interval Training   Interval Training  No      Oxygen   Oxygen  Continuous    Liters  2      NuStep   Level  2    SPM  80    Minutes  51    METs  1.4       Nutrition:  Target Goals: Understanding of nutrition guidelines, daily intake of sodium <1537m, cholesterol <2075m calories 30% from fat and 7% or less from saturated fats, daily to have 5 or more servings of fruits and vegetables.  Biometrics: Pre Biometrics - 12/19/17 1315      Pre Biometrics   Grip Strength  24 kg        Nutrition Therapy Plan and Nutrition Goals: Nutrition Therapy & Goals - 01/06/18 0833      Nutrition Therapy   Protein (specify units)  general, healthful      Personal Nutrition Goals   Nutrition Goal  Identify food quantities necessary to achieve wt loss of  -2# per week to a goal wt loss of 6-24 lb at graduation from pulmonary rehab.    Personal Goal #2  Pt to identify and limit food sources of sodium.      Intervention Plan   Intervention  Prescribe, educate and counsel regarding individualized specific dietary modifications aiming towards targeted core  components such as weight, hypertension, lipid management, diabetes, heart failure and other comorbidities.    Expected Outcomes  Short Term Goal: Understand basic principles of dietary content, such as calories, fat, sodium, cholesterol and nutrients.       Nutrition Assessments: Nutrition Assessments - 12/28/17 1234      Rate Your Plate Scores   Pre Score  55       Nutrition Goals Re-Evaluation: Nutrition Goals Re-Evaluation    [place] 01/06/18 0834             Goals   Nutrition Goal  Identify food quantities necessary to achieve wt loss of  -2# per week to a goal wt loss of 6-24 lb at graduation from pulmonary rehab.          Nutrition Goals Discharge (Final Nutrition Goals Re-Evaluation): Nutrition Goals Re-Evaluation - 01/06/18 0834      Goals   Nutrition Goal  Identify food quantities necessary to achieve wt loss of  -2# per week to a goal wt loss of 6-24 lb at  graduation from pulmonary rehab.       Psychosocial: Target Goals: Acknowledge presence or absence of significant depression and/or stress, maximize coping skills, provide positive support system. Participant is able to verbalize types and ability to use techniques and skills needed for reducing stress and depression.  Initial Review & Psychosocial Screening: Initial Psych Review & Screening - 12/19/17 1321      Initial Review   Current issues with  None Identified      Family Dynamics   Good Support System?  Yes      Barriers   Psychosocial barriers to participate in program  There are no identifiable barriers or psychosocial needs.      Screening Interventions   Interventions  Encouraged to exercise       Quality of Life Scores:  Scores of 19 and below usually indicate a poorer quality of life in these areas.  A difference of  2-3 points is a clinically meaningful difference.  A difference of 2-3 points in the total score of the Quality of Life Index has been associated with significant  improvement in overall quality of life, self-image, physical symptoms, and general health in studies assessing change in quality of life.  PHQ-9: Recent Review Flowsheet Data    Depression screen [facility] 2/9 12/19/2017 11/30/2017 07/26/2016   Decreased Interest 0 0 0   Down, Depressed, Hopeless 1 1 0   PHQ - 2 Score 1 1 0     Interpretation of Total Score  Total Score Depression Severity:  1-4 = Minimal depression, 5-9 = Mild depression, 10-14 = Moderate depression, 15-19 = Moderately severe depression, 20-27 = Severe depression   Psychosocial Evaluation and Intervention: Psychosocial Evaluation - 03/16/18 1708      Psychosocial Evaluation & Interventions   Interventions  Encouraged to exercise with the program and follow exercise prescription    Comments  No indentifiable barriers to participating in pulmonary rehab Pt interacts well with fellow participants and staff.    Expected Outcomes  Pt will continue to display positive and healthy coping skill with positive outlook on her future.    Continue Psychosocial Services   Follow up required by staff       Psychosocial Re-Evaluation: Psychosocial Re-Evaluation    [place] Name 01/17/18 1036 02/15/18 1248 03/16/18 1708         Psychosocial Re-Evaluation   Current issues with  None Identified  None Identified  None Identified     Comments  pt display pleasant and positive outlook.  Pt interacts well with staff and fellow participants  pt display pleasant and positive outlook.  Pt interacts well with staff and fellow participants  pt display pleasant and positive outlook.  Pt interacts well with staff and fellow participants     Expected Outcomes  Pt will contine to be free from any psychosocial barriers.  Pt will contine to be free from any psychosocial barriers.  Pt will contine to be free from any psychosocial barriers.     Interventions  Encouraged to attend Pulmonary Rehabilitation for the exercise;Stress management education;Relaxation  education  Encouraged to attend Pulmonary Rehabilitation for the exercise;Stress management education;Relaxation education  Encouraged to attend Pulmonary Rehabilitation for the exercise;Stress management education;Relaxation education     Continue Psychosocial Services   Follow up required by staff  Follow up required by staff  Follow up required by staff        Psychosocial Discharge (Final Psychosocial Re-Evaluation): Psychosocial Re-Evaluation - 03/16/18 1708  Psychosocial Re-Evaluation   Current issues with  None Identified    Comments  pt display pleasant and positive outlook.  Pt interacts well with staff and fellow participants    Expected Outcomes  Pt will contine to be free from any psychosocial barriers.    Interventions  Encouraged to attend Pulmonary Rehabilitation for the exercise;Stress management education;Relaxation education    Continue Psychosocial Services   Follow up required by staff       Education: Education Goals: Education classes will be provided on a weekly basis, covering required topics. Participant will state understanding/return demonstration of topics presented.  Learning Barriers/Preferences: Learning Barriers/Preferences - 12/19/17 1306      Learning Barriers/Preferences   Learning Barriers  None    Learning Preferences  Computer/Internet       Education Topics: Risk Factor Reduction:  -Group instruction that is supported by a PowerPoint presentation. Instructor discusses the definition of a risk factor, different risk factors for pulmonary disease, and how the heart and lungs work together.     Nutrition for Pulmonary Patient:  -Group instruction provided by PowerPoint slides, verbal discussion, and written materials to support subject matter. The instructor gives an explanation and review of healthy diet recommendations, which includes a discussion on weight management, recommendations for fruit and vegetable consumption, as well as protein,  fluid, caffeine, fiber, sodium, sugar, and alcohol. Tips for eating when patients are short of breath are discussed.   PULMONARY REHAB OTHER RESPIRATORY from 02/23/2018 in [place]  Date  02/26/18  Instruction Review Code  1- Verbalizes Understanding      Pursed Lip Breathing:  -Group instruction that is supported by demonstration and informational handouts. Instructor discusses the benefits of pursed lip and diaphragmatic breathing and detailed demonstration on how to preform both.     Oxygen Safety:  -Group instruction provided by PowerPoint, verbal discussion, and written material to support subject matter. There is an overview of "What is Oxygen" and "Why do we need it".  Instructor also reviews how to create a safe environment for oxygen use, the importance of using oxygen as prescribed, and the risks of noncompliance. There is a brief discussion on traveling with oxygen and resources the patient may utilize.   PULMONARY REHAB OTHER RESPIRATORY from 02/23/2018 in [place]  Date  01/12/18  Educator  Cloyde Reams  Instruction Review Code  1- Verbalizes Understanding      Oxygen Equipment:  -Group instruction provided by [employer] utilizing handouts, written materials, and Insurance underwriter.   PULMONARY REHAB OTHER RESPIRATORY from 02/23/2018 in [place]  Date  01/19/18  Educator  [place]  Instruction Review Code  2- Demonstrated Understanding      Signs and Symptoms:  -Group instruction provided by written material and verbal discussion to support subject matter. Warning signs and symptoms of infection, stroke, and heart attack are reviewed and when to call the physician/911 reinforced. Tips for preventing the spread of infection discussed.   PULMONARY REHAB OTHER RESPIRATORY from 02/23/2018 in [place]  Date  01/05/18  Educator  Remo Lipps   Instruction Review Code  1- Verbalizes Understanding      Advanced Directives:  -Group instruction provided by verbal instruction and written material to support subject matter. Instructor reviews Advanced Directive laws and proper instruction for filling out document.   Pulmonary Video:  -Group video education that reviews the importance of medication and oxygen compliance,  exercise, good nutrition, pulmonary hygiene, and pursed lip and diaphragmatic breathing for the pulmonary patient.   Exercise for the Pulmonary Patient:  -Group instruction that is supported by a PowerPoint presentation. Instructor discusses benefits of exercise, core components of exercise, frequency, duration, and intensity of an exercise routine, importance of utilizing pulse oximetry during exercise, safety while exercising, and options of places to exercise outside of rehab.     PULMONARY REHAB OTHER RESPIRATORY from 02/23/2018 in [place]  Date  01/26/18  Instruction Review Code  1- Verbalizes Understanding      Pulmonary Medications:  -Verbally interactive group education provided by instructor with focus on inhaled medications and proper administration.   Anatomy and Physiology of the Respiratory System and Intimacy:  -Group instruction provided by PowerPoint, verbal discussion, and written material to support subject matter. Instructor reviews respiratory cycle and anatomical components of the respiratory system and their functions. Instructor also reviews differences in obstructive and restrictive respiratory diseases with examples of each. Intimacy, Sex, and Sexuality differences are reviewed with a discussion on how relationships can change when diagnosed with pulmonary disease. Common sexual concerns are reviewed.   PULMONARY REHAB OTHER RESPIRATORY from 02/23/2018 in [place]  Date  02/16/18  Educator  rn  Instruction Review Code  2-  Demonstrated Understanding      MD DAY -A group question and answer session with a medical doctor that allows participants to ask questions that relate to their pulmonary disease state.   OTHER EDUCATION -Group or individual verbal, written, or video instructions that support the educational goals of the pulmonary rehab program.   Holiday Eating Survival Tips:  -Group instruction provided by PowerPoint slides, verbal discussion, and written materials to support subject matter. The instructor gives patients tips, tricks, and techniques to help them not only survive but enjoy the holidays despite the onslaught of food that accompanies the holidays.   Knowledge Questionnaire Score:   Core Components/Risk Factors/Patient Goals at Admission: Personal Goals and Risk Factors at Admission - 12/19/17 1317      Core Components/Risk Factors/Patient Goals on Admission    Weight Management  Yes    Intervention  Weight Management: Develop a combined nutrition and exercise program designed to reach desired caloric intake, while maintaining appropriate intake of nutrient and fiber, sodium and fats, and appropriate energy expenditure required for the weight goal.;Weight Management: Provide education and appropriate resources to help participant work on and attain dietary goals.;Weight Management/Obesity: Establish reasonable short term and long term weight goals.;Obesity: Provide education and appropriate resources to help participant work on and attain dietary goals.    Admit Weight  257 lb 8 oz (116.8 kg)    Goal Weight: Short Term  246 lb 14.6 oz (112 kg)    Goal Weight: Long Term  242 lb 8.1 oz (110 kg)    Expected Outcomes  Short Term: Continue to assess and modify interventions until short term weight is achieved;Long Term: Adherence to nutrition and physical activity/exercise program aimed toward attainment of established weight goal;Weight Maintenance: Understanding of the daily nutrition  guidelines, which includes 25-35% calories from fat, 7% or less cal from saturated fats, less than 246m cholesterol, less than 1.5gm of sodium, & 5 or more servings of fruits and vegetables daily;Weight Loss: Understanding of general recommendations for a balanced deficit meal plan, which promotes 1-2 lb weight loss per week and includes a negative energy balance of [id number] kcal/d;Understanding recommendations for meals to include  15-35% energy as protein, 25-35% energy from fat, 35-60% energy from carbohydrates, less than 255m of dietary cholesterol, 20-35 gm of total fiber daily;Understanding of distribution of calorie intake throughout the day with the consumption of 4-5 meals/snacks;Weight Gain: Understanding of general recommendations for a high calorie, high protein meal plan that promotes weight gain by distributing calorie intake throughout the day with the consumption for 4-5 meals, snacks, and/or supplements    Improve shortness of breath with ADL's  Yes    Intervention  Provide education, individualized exercise plan and daily activity instruction to help decrease symptoms of SOB with activities of daily living.    Expected Outcomes  Short Term: Improve cardiorespiratory fitness to achieve a reduction of symptoms when performing ADLs;Long Term: Be able to perform more ADLs without symptoms or delay the onset of symptoms       Core Components/Risk Factors/Patient Goals Review:  Goals and Risk Factor Review    Row Name 12/19/17 1320 01/17/18 1033 02/15/18 1245 03/16/18 1708       Core Components/Risk Factors/Patient Goals Review   Personal Goals Review  Weight Management/Obesity;Improve shortness of breath with ADL's;Increase knowledge of respiratory medications and ability to use respiratory devices properly.;Develop more efficient breathing techniques such as purse lipped breathing and diaphragmatic breathing and practicing self-pacing with activity.  Weight Management/Obesity;Improve  shortness of breath with ADL's;Increase knowledge of respiratory medications and ability to use respiratory devices properly.;Develop more efficient breathing techniques such as purse lipped breathing and diaphragmatic breathing and practicing self-pacing with activity.  Weight Management/Obesity;Improve shortness of breath with ADL's;Increase knowledge of respiratory medications and ability to use respiratory devices properly.;Develop more efficient breathing techniques such as purse lipped breathing and diaphragmatic breathing and practicing self-pacing with activity.  Weight Management/Obesity;Improve shortness of breath with ADL's;Increase knowledge of respiratory medications and ability to use respiratory devices properly.;Develop more efficient breathing techniques such as purse lipped breathing and diaphragmatic breathing and practicing self-pacing with activity.    Review  -  Pt has completed 3 exercise sessions.  Unable to review pt progress toward pt goals.  Hopeful that pt will show progress toward pt goals in the next 30 day review.  Pt has completed 10 exercise sessions. Due to pt chronic and multiple orthopedic challenges, pt is only able to to seated stepper for all three stations. Pt workloads maintain at level 1.  Pt not able to engage in any exercise or activity outside of rehab. Pt has lost 2.7 kg. Hopeful that pt will show measureable progress toward pt goals in the next 30 day review.  Pt has completed 17 exercise sessions. Due to pt chronic and multiple orthopedic challenges, pt is only able to to seated stepper for two stations. Pt workloads increased to level 2.  Pt can ambulate some around the track on days her legs feel "ok"  averages about 5 laps. Pt not able to engage in any consistent exercise or activity outside of rehab. Pt has lost .9kg. Hopeful that pt will show measureable progress toward pt goals in the next 30 day review.    Expected Outcomes  -  See Admission Goals/Outcomes   See Admission Goals/Outcomes  See Admission Goals/Outcomes       Core Components/Risk Factors/Patient Goals at Discharge (Final Review):  Goals and Risk Factor Review - 03/16/18 1708      Core Components/Risk Factors/Patient Goals Review   Personal Goals Review  Weight Management/Obesity;Improve shortness of breath with ADL's;Increase knowledge of respiratory medications and ability to use respiratory devices  properly.;Develop more efficient breathing techniques such as purse lipped breathing and diaphragmatic breathing and practicing self-pacing with activity.    Review  Pt has completed 17 exercise sessions. Due to pt chronic and multiple orthopedic challenges, pt is only able to to seated stepper for two stations. Pt workloads increased to level 2.  Pt can ambulate some around the track on days her legs feel "ok"  averages about 5 laps. Pt not able to engage in any consistent exercise or activity outside of rehab. Pt has lost .9kg. Hopeful that pt will show measureable progress toward pt goals in the next 30 day review.    Expected Outcomes  See Admission Goals/Outcomes       ITP Comments: ITP Comments    Row Name 01/17/18 1033 03/16/18 1711         ITP Comments  Dr. Jennet Maduro, Medical Director  Dr. Jennet Maduro, Medical Director Pulmonary Rehab         Comments: Pt completed 17 exercise sessions. Continue to monitor. Cherre Huger, BSN Cardiac and Training and development officer

## 2018-03-16 NOTE — Progress Notes (Signed)
Daily Session Note  Patient Details  Name: Kathy [name] MRN: [id number] Date of Birth: 16-Mar-1941 Referring Provider:     Pulmonary Rehab Walk Test from 12/22/2017 in [place]  Referring Provider  Dr. Halford Chessman      Encounter Date: 03/16/2018  Check In: Session Check In - 03/16/18 1600      Check-In   Supervising physician immediately available to respond to emergencies  [facility] immediately available    Physician(s)  Dr. Toma Copier    Location  [facility]    Staff Present  Maurice Small, RN, BSN;Mattelyn Imhoff, MS, ACSM RCEP, Exercise Physiologist;Lisa Ysidro Evert, Felipe Drone, RN, MHA    Medication changes reported      No    Fall or balance concerns reported     No    Tobacco Cessation  No Change    Warm-up and Cool-down  Performed as group-led instruction    Resistance Training Performed  Yes    VAD Patient?  No    PAD/SET Patient?  No      Pain Assessment   Currently in Pain?  No/denies    Multiple Pain Sites  No       Capillary Blood Glucose: No results found for this or any previous visit (from the past 24 hour(s)).    Social History   Tobacco Use  Smoking Status Former Smoker  . Packs/day: 1.00  . Years: 29.00  . Pack years: 29.00  . Types: Cigarettes  . Last attempt to quit: 06/28/1986  . Years since quitting: 31.7  Smokeless Tobacco Never Used  Tobacco Comment   quit 40+ yrs ago, 1 PPD for a few years    Goals Met:  Exercise tolerated well  Goals Unmet:  Not Applicable  Comments: Service time is from 1:30p to 3:25p    Dr. Rush Farmer is Medical Director for Pulmonary Rehab at [facility].

## 2018-03-21 ENCOUNTER — Encounter (HOSPITAL_COMMUNITY)
Admission: RE | Admit: 2018-03-21 | Discharge: 2018-03-21 | Disposition: A | Discharge status: Home / Self Care | Payer: Medicare Other | Source: Ambulatory Visit | Attending: Pulmonary Disease | Admitting: Pulmonary Disease

## 2018-03-21 DIAGNOSIS — J849 Interstitial pulmonary disease, unspecified: Secondary | ICD-10-CM | POA: Diagnosis not present

## 2018-03-21 NOTE — Progress Notes (Signed)
Daily Session Note  Patient Details  Name: Kathy [name] MRN: [id number] Date of Birth: 1941-03-02 Referring Provider:     Pulmonary Rehab Walk Test from 12/22/2017 in [place]  Referring Provider  Dr. Halford Chessman      Encounter Date: 03/21/2018  Check In: Session Check In - 03/21/18 1422      Check-In   Supervising physician immediately available to respond to emergencies  [facility] immediately available    Physician(s)  Dr.Danford     Location  [facility]    Staff Present  Maurice Small, RN, BSN;Morrison Masser, MS, ACSM RCEP, Exercise Physiologist;Annedrea Stackhouse, RN, MHA;Maria Whitaker, RN, BSN    Medication changes reported      No    Fall or balance concerns reported     No    Tobacco Cessation  No Change    Warm-up and Cool-down  Performed as group-led Higher education careers adviser Performed  Yes    VAD Patient?  No    PAD/SET Patient?  No      Pain Assessment   Currently in Pain?  No/denies       Capillary Blood Glucose: No results found for this or any previous visit (from the past 24 hour(s)).    Social History   Tobacco Use  Smoking Status Former Smoker  . Packs/day: 1.00  . Years: 29.00  . Pack years: 29.00  . Types: Cigarettes  . Last attempt to quit: 06/28/1986  . Years since quitting: 31.7  Smokeless Tobacco Never Used  Tobacco Comment   quit 40+ yrs ago, 1 PPD for a few years    Goals Met:  Exercise tolerated well  Goals Unmet:  Not Applicable  Comments: Service time is from 1:30 to 3:15p    Dr. Rush Farmer is Medical Director for Pulmonary Rehab at [facility].

## 2018-03-23 ENCOUNTER — Encounter (HOSPITAL_COMMUNITY)
Admission: RE | Admit: 2018-03-23 | Discharge: 2018-03-23 | Disposition: A | Discharge status: Home / Self Care | Payer: Medicare Other | Source: Ambulatory Visit | Attending: Pulmonary Disease | Admitting: Pulmonary Disease

## 2018-03-23 DIAGNOSIS — J849 Interstitial pulmonary disease, unspecified: Secondary | ICD-10-CM

## 2018-03-23 NOTE — Progress Notes (Signed)
I have reviewed a Home Exercise Prescription with Kathy [name] . Kathy [name] is not currently exercising at home.  The patient was advised to walk 2 days a week for 20 minutes.  Kathy [name] and I discussed how to progress their exercise prescription.  The patient stated that their goals were to increase leg strength and endurance.  The patient stated that they understand the exercise prescription.  We reviewed exercise guidelines, target heart rate during exercise, RPE Scale, weather conditions, NTG use, endpoints for exercise, warmup and cool down.  Patient is encouraged to come to me with any questions. I will continue to follow up with the patient to assist them with progression and safety.

## 2018-03-23 NOTE — Progress Notes (Signed)
Daily Session Note  Patient Details  Name: Kathy [name] MRN: [id number] Date of Birth: 05/10/1941 Referring Provider:     Pulmonary Rehab Walk Test from 12/22/2017 in [place]  Referring Provider  Dr. Halford Chessman      Encounter Date: 03/23/2018  Check In: Session Check In - 03/23/18 1412      Check-In   Supervising physician immediately available to respond to emergencies  [facility] immediately available    Physician(s)  Dr.Danford     Location  [facility]    Staff Present  Maurice Small, RN, BSN;Aayla Marrocco, MS, ACSM RCEP, Exercise Physiologist;Annedrea Stackhouse, RN, MHA    Medication changes reported      No    Fall or balance concerns reported     No    Tobacco Cessation  No Change    Warm-up and Cool-down  Performed as group-led Higher education careers adviser Performed  Yes    VAD Patient?  No    PAD/SET Patient?  No      Pain Assessment   Currently in Pain?  No/denies    Multiple Pain Sites  No       Capillary Blood Glucose: No results found for this or any previous visit (from the past 24 hour(s)).    Social History   Tobacco Use  Smoking Status Former Smoker  . Packs/day: 1.00  . Years: 29.00  . Pack years: 29.00  . Types: Cigarettes  . Last attempt to quit: 06/28/1986  . Years since quitting: 31.7  Smokeless Tobacco Never Used  Tobacco Comment   quit 40+ yrs ago, 1 PPD for a few years    Goals Met:  Exercise tolerated well  Goals Unmet:  Not Applicable  Comments: Service time is from 1:30p to 3:30p    Dr. Rush Farmer is Medical Director for Pulmonary Rehab at [facility].

## 2018-03-28 ENCOUNTER — Encounter (HOSPITAL_COMMUNITY)
Admission: RE | Admit: 2018-03-28 | Discharge: 2018-03-28 | Disposition: A | Discharge status: Home / Self Care | Payer: Medicare Other | Source: Ambulatory Visit | Attending: Pulmonary Disease | Admitting: Pulmonary Disease

## 2018-03-28 DIAGNOSIS — J849 Interstitial pulmonary disease, unspecified: Secondary | ICD-10-CM | POA: Diagnosis not present

## 2018-03-28 NOTE — Progress Notes (Signed)
Daily Session Note  Patient Details  Name: Kathy [name] MRN: [id number] Date of Birth: 09/04/1940 Referring Provider:     Pulmonary Rehab Walk Test from 12/22/2017 in [place]  Referring Provider  Dr. Halford Chessman      Encounter Date: 03/28/2018  Check In: Session Check In - 03/28/18 1526      Check-In   Supervising physician immediately available to respond to emergencies  [facility] immediately available    Physician(s)  Dr. Louanne Belton    Location  [facility]    Staff Present  Maurice Small, RN, BSN;Molly DiVincenzo, MS, ACSM RCEP, Exercise Physiologist;Annedrea Rosezella [place], RN, Ramonita Lab, RN    Medication changes reported      No    Fall or balance concerns reported     No    Tobacco Cessation  No Change    Warm-up and Cool-down  Performed as group-led instruction    Resistance Training Performed  Yes    VAD Patient?  No    PAD/SET Patient?  No      Pain Assessment   Currently in Pain?  No/denies       Capillary Blood Glucose: No results found for this or any previous visit (from the past 24 hour(s)).  Exercise Prescription Changes - 03/28/18 1600      Response to Exercise   Blood Pressure (Admit)  132/64    Blood Pressure (Exercise)  126/62    Blood Pressure (Exit)  128/60    Heart Rate (Admit)  97 bpm    Heart Rate (Exercise)  87 bpm    Heart Rate (Exit)  79 bpm    Oxygen Saturation (Admit)  96 %    Oxygen Saturation (Exercise)  95 %    Oxygen Saturation (Exit)  96 %    Rating of Perceived Exertion (Exercise)  10    Perceived Dyspnea (Exercise)  2    Duration  Progress to 45 minutes of aerobic exercise without signs/symptoms of physical distress    Intensity  THRR unchanged      Progression   Progression  Continue to progress workloads to maintain intensity without signs/symptoms of physical distress.      Resistance Training   Training Prescription  Yes    Weight  orange bands    Reps  10-15    Time   10 Minutes      Oxygen   Oxygen  Continuous    Liters  2      NuStep   Level  2    SPM  80    Minutes  51    METs  1.3       Social History   Tobacco Use  Smoking Status Former Smoker  . Packs/day: 1.00  . Years: 29.00  . Pack years: 29.00  . Types: Cigarettes  . Last attempt to quit: 06/28/1986  . Years since quitting: 31.7  Smokeless Tobacco Never Used  Tobacco Comment   quit 40+ yrs ago, 1 PPD for a few years    Goals Met:  Exercise tolerated well No report of cardiac concerns or symptoms Strength training completed today  Goals Unmet:  Not Applicable  Comments: Service time is from 1300 to 1515    Dr. Rush Farmer is Medical Director for Pulmonary Rehab at [facility].

## 2018-03-30 ENCOUNTER — Encounter (HOSPITAL_COMMUNITY)
Admission: RE | Admit: 2018-03-30 | Discharge: 2018-03-30 | Disposition: A | Discharge status: Home / Self Care | Payer: Medicare Other | Source: Ambulatory Visit | Attending: Pulmonary Disease | Admitting: Pulmonary Disease

## 2018-03-30 DIAGNOSIS — J849 Interstitial pulmonary disease, unspecified: Secondary | ICD-10-CM | POA: Diagnosis not present

## 2018-03-30 NOTE — Progress Notes (Signed)
Daily Session Note  Patient Details  Name: Madeleine Fenn MRN: [id number] Date of Birth: 06-25-1941 Referring Provider:     Pulmonary Rehab Walk Test from 12/22/2017 in [place]  Referring Provider  Dr. Halford Chessman      Encounter Date: 03/30/2018  Check In: Session Check In - 03/30/18 1408      Check-In   Supervising physician immediately available to respond to emergencies  [facility] immediately available    Physician(s)  Dr. Horris Latino    Location  [facility]    Staff Present  Maurice Small, RN, BSN;Molly DiVincenzo, MS, ACSM RCEP, Exercise Physiologist;Ritchard Paragas Ysidro Evert, Felipe Drone, RN, MHA    Medication changes reported      No    Fall or balance concerns reported     No    Tobacco Cessation  No Change    Warm-up and Cool-down  Performed as group-led instruction    Resistance Training Performed  Yes    VAD Patient?  No    PAD/SET Patient?  No      Pain Assessment   Currently in Pain?  No/denies    Multiple Pain Sites  No       Capillary Blood Glucose: No results found for this or any previous visit (from the past 24 hour(s)).    Social History   Tobacco Use  Smoking Status Former Smoker  . Packs/day: 1.00  . Years: 29.00  . Pack years: 29.00  . Types: Cigarettes  . Last attempt to quit: 06/28/1986  . Years since quitting: 31.7  Smokeless Tobacco Never Used  Tobacco Comment   quit 40+ yrs ago, 1 PPD for a few years    Goals Met:  Exercise tolerated well No report of cardiac concerns or symptoms Strength training completed today  Goals Unmet:  Not Applicable  Comments: Service time is from 1330 to 1530    Dr. Rush Farmer is Medical Director for Pulmonary Rehab at [facility].

## 2018-03-31 ENCOUNTER — Encounter: Payer: Self-pay | Admitting: Adult Health

## 2018-03-31 ENCOUNTER — Ambulatory Visit (INDEPENDENT_AMBULATORY_CARE_PROVIDER_SITE_OTHER): Payer: Medicare Other | Admitting: Adult Health

## 2018-03-31 ENCOUNTER — Ambulatory Visit (INDEPENDENT_AMBULATORY_CARE_PROVIDER_SITE_OTHER)
Admission: RE | Admit: 2018-03-31 | Discharge: 2018-03-31 | Disposition: A | Discharge status: Home / Self Care | Payer: Medicare Other | Source: Ambulatory Visit | Attending: Adult Health | Admitting: Adult Health

## 2018-03-31 VITALS — BP 138/84 | HR 106 | Ht 61.0 in | Wt 256.6 lb

## 2018-03-31 DIAGNOSIS — J479 Bronchiectasis, uncomplicated: Secondary | ICD-10-CM

## 2018-03-31 DIAGNOSIS — J209 Acute bronchitis, unspecified: Secondary | ICD-10-CM

## 2018-03-31 DIAGNOSIS — R05 Cough: Secondary | ICD-10-CM | POA: Diagnosis not present

## 2018-03-31 DIAGNOSIS — J9611 Chronic respiratory failure with hypoxia: Secondary | ICD-10-CM | POA: Diagnosis not present

## 2018-03-31 DIAGNOSIS — J849 Interstitial pulmonary disease, unspecified: Secondary | ICD-10-CM | POA: Diagnosis not present

## 2018-03-31 MED ORDER — AMOXICILLIN-POT CLAVULANATE 875-125 MG PO TABS: 1.0000 | ORAL_TABLET | Freq: Two times a day (BID) | ORAL | 0 refills | Status: AC

## 2018-03-31 MED ORDER — LEVALBUTEROL HCL 0.63 MG/3ML IN NEBU
0.6300 mg | INHALATION_SOLUTION | Freq: Once | RESPIRATORY_TRACT | Status: AC
  Administered 2018-03-31: 0.63 mg via RESPIRATORY_TRACT

## 2018-03-31 MED ORDER — LEVALBUTEROL HCL 0.63 MG/3ML IN NEBU: 0.6300 mg | INHALATION_SOLUTION | Freq: Once | RESPIRATORY_TRACT | Status: DC

## 2018-03-31 NOTE — Patient Instructions (Addendum)
Augmentin 875 mg twice daily for 7 days, take with food Increase prednisone to 20 mg daily Mucinex DM twice daily as needed for cough and congestion Tessalon as needed for cough Chest x-ray today Follow up in 2 weeks and As needed   Please contact office for sooner follow up if symptoms do not improve or worsen or seek emergency care

## 2018-03-31 NOTE — Progress Notes (Signed)
@Patient  ID: Kathy [name], female    DOB: [birth year], 77 y.o.   MRN: [id number]  Chief Complaint  Patient presents with  . Acute Visit    ILD     Referring provider: Biagio Borg, MD  HPI: 77 yo female former smoker ( 1988) seen for pulmonary consult 02/15/17 for dyspnea and hypoxiaFound to have ILD on CT chest . O2 RF on O2 at 2l/m w/ act andAt bedtime Has OSA -CPAP intolerant .   TEST/Events  ILD workup : 03/01/17 >> ANA negative, RF < 14, CCP < 16, ANCA negative, SSA/SSB < 0.52m HSP neg  HRCT showed 3 mm left upper lobe nodule, patchy air trapping bilaterally, patchy reticulation and groundglass opacities bilaterally with minimum traction bronchiectasis. This was concerning for ILD with a possible chronic hypersensitivity pneumonitis component.  PFT was done today that showed moderate restriction with no airflow obstruction. FEV1 was 82%, ratio 96, FVC 63%, DLCO 70%, no significant bronchodilator response. Total lung capacity 64%  Worked at [employer] for 6 yr . No Amiodarone , macrobid , or MTX use/exposure.  Second hand smoke from husband.  Echo EF 65-70%, Grade 1 DD .  Aspergillus IgE Panel 07/20/17 >> negative IgE 07/20/17 >> 44  Pulmonary Rehab 09/2017 referral >  Cardiac tests: Echo 03/21/17 >> EF 65 to 70%, grade 1 DD    03/31/2018 Acute OV : Cough  Pt presents for an acute office visit . 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 She is on chronic steroids with prednisone 10mg  daily with perceived clinical benefit.  Serial PFT and CT chest shows progression of ILD and worsening of DLCO . Complains over last week she has had productive cough with very thick yellow mucus. Difficult to get up. Some increased dyspnea and fatigue. No chest pain , orthopnea or edema. No fever. Appetite is fair. W/ no n/v/d.  She continues in pulmonary rehab program .      Allergies  Allergen Reactions  . Lipitor [Atorvastatin] Other (See Comments)   Memory issues  . Requip [Ropinirole Hcl] Other (See Comments)    Pt reports feeling generally unwell on this medication    Immunization History  Administered Date(s) Administered  . Influenza, High Dose Seasonal PF 04/04/2017, 03/14/2018  . Pneumococcal Conjugate-13 11/30/2017  . Pneumococcal Polysaccharide-23 06/30/2016  . Tdap 11/30/2017    Past Medical History:  Diagnosis Date  . Arthritis    fingers  . CKD (chronic kidney disease) stage 3, GFR 30-59 ml/min (HCC) 11/30/2017  . Depression   . Dizziness    in AM, getting out of bed  . Dysrhythmia    "skips a beat" sometimes - followed by PCP  . Endometrial ca ([place]) 11/30/2017   S/p surgury 1990's  . GERD (gastroesophageal reflux disease) 11/30/2017  . HLD (hyperlipidemia) 11/30/2017  . Hypercholesteremia   . Hypertension   . Neuropathy    bilateral feet  . Shortness of breath dyspnea   . Sleep apnea    has CPAP, doesn't use  . Umbilical hernia     Tobacco History: Social History   Tobacco Use  Smoking Status Former Smoker  . Packs/day: 1.00  . Years: 29.00  . Pack years: 29.00  . Types: Cigarettes  . Last attempt to quit: 06/28/1986  . Years since quitting: 31.7  Smokeless Tobacco Never Used  Tobacco Comment   quit 40+ yrs ago, 1 PPD for a few years   Counseling given: Not Answered Comment:  quit 40+ yrs ago, 1 PPD for a few years   Outpatient Medications Prior to Visit  Medication Sig Dispense Refill  . albuterol (PROVENTIL HFA;VENTOLIN HFA) 108 (90 Base) MCG/ACT inhaler Inhale 1-2 puffs into the lungs every 6 (six) hours as needed for wheezing or shortness of breath. 1 Inhaler 2  . benzonatate (TESSALON PERLES) 100 MG capsule Take 1 capsule (100 mg total) by mouth 2 (two) times daily as needed for cough. 60 capsule 5  . etodolac (LODINE) 300 MG capsule Take 300 mg by mouth 2 (two) times daily.    . fenofibrate micronized (LOFIBRA) 134 MG capsule Take 134 mg by mouth daily before breakfast.    . furosemide (LASIX)  40 MG tablet 1 tab by mouth in the AM, and 1 tab by mouth in the PM as needed for persistent swelling or weight gain more than 3-5 lbs 60 tablet 11  . losartan (COZAAR) 100 MG tablet Take 50 mg by mouth daily.    . metoprolol succinate (TOPROL-XL) 50 MG 24 hr tablet Take 1 tablet (50 mg total) by mouth daily. 90 tablet 1  . predniSONE (DELTASONE) 20 MG tablet Take 1 tablet (20 mg total) by mouth daily with breakfast. 30 tablet 5  . venlafaxine XR (EFFEXOR-XR) 150 MG 24 hr capsule Take 1 capsule (150 mg total) by mouth daily. 90 capsule 3   No facility-administered medications prior to visit.      Review of Systems  Constitutional:   No  weight loss, night sweats,  Fevers, chills,  +fatigue, or  lassitude.  HEENT:   No headaches,  Difficulty swallowing,  Tooth/dental problems, or  Sore throat,                No sneezing, itching, ear ache, nasal congestion, post nasal drip,   CV:  No chest pain,  Orthopnea, PND, swelling in lower extremities, anasarca, dizziness, palpitations, syncope.   GI  No heartburn, indigestion, abdominal pain, nausea, vomiting, diarrhea, change in bowel habits, loss of appetite, bloody stools.   Resp:    No chest wall deformity  Skin: no rash or lesions.  GU: no dysuria, change in color of urine, no urgency or frequency.  No flank pain, no hematuria   MS:  No joint pain or swelling.  No decreased range of motion.  No back pain.    Physical Exam  BP 138/84 (BP Location: Left Arm, Patient Position: Sitting, Cuff Size: Normal) Comment (BP Location): left forearm per patient request  Pulse (!) 106   Ht 5\' 1"  (1.549 m)   Wt 256 lb 9.6 oz (116.4 kg)   SpO2 91%   BMI 48.48 kg/m   GEN: A/Ox3; pleasant , NAD, obese    HEENT:  [place]/AT,  EACs-clear, TMs-wnl, NOSE-clear, THROAT-clear, no lesions, no postnasal drip or exudate noted.   NECK:  Supple w/ fair ROM; no JVD; normal carotid impulses w/o bruits; no thyromegaly or nodules palpated; no lymphadenopathy.     RESP  Few trace rhonchi  . no accessory muscle use, no dullness to percussion  CARD:  RRR, no m/r/g, tr  peripheral edema, pulses intact, no cyanosis or clubbing.  GI:   Soft & nt; nml bowel sounds; no organomegaly or masses detected.   Musco: Warm bil, no deformities or joint swelling noted.   Neuro: alert, no focal deficits noted.    Skin: Warm, no lesions or rashes    Lab Results:  CBC   BNP  Imaging: No results found.  Assessment & Plan:   No problem-specific Assessment & Plan notes found for this encounter.     Rexene Edison, NP 03/31/2018

## 2018-04-03 DIAGNOSIS — J209 Acute bronchitis, unspecified: Secondary | ICD-10-CM | POA: Insufficient documentation

## 2018-04-03 NOTE — Progress Notes (Signed)
Called spoke with patient's spouse.  Advised of cxr results / recs as stated by TP.  Spouse verbalized understanding and denied any questions.

## 2018-04-03 NOTE — Assessment & Plan Note (Signed)
Acute bronchitis with underlying steroid dependent ILD .  Check cxr today to make sure no PNA .  tx w/ abx and close follow up  Short steroid burst , on return try to slow taper back to 10mg  daily   Plan  Patient Instructions  Augmentin 875 mg twice daily for 7 days, take with food Increase prednisone to 20 mg daily Mucinex DM twice daily as needed for cough and congestion Tessalon as needed for cough Chest x-ray today Follow up in 2 weeks and As needed   Please contact office for sooner follow up if symptoms do not improve or worsen or seek emergency care

## 2018-04-03 NOTE — Assessment & Plan Note (Signed)
Cont on O2 .  

## 2018-04-03 NOTE — Assessment & Plan Note (Signed)
Recent CT and PFT show ILD progression . Back on steroids for now . Will give short burst then start to taper down to lower dose . Will need to look at options of steroids sparing rx going forward.

## 2018-04-04 ENCOUNTER — Encounter (HOSPITAL_COMMUNITY)
Admission: RE | Admit: 2018-04-04 | Discharge: 2018-04-04 | Disposition: A | Discharge status: Home / Self Care | Payer: Medicare Other | Source: Ambulatory Visit | Attending: Pulmonary Disease | Admitting: Pulmonary Disease

## 2018-04-04 DIAGNOSIS — J849 Interstitial pulmonary disease, unspecified: Secondary | ICD-10-CM | POA: Diagnosis not present

## 2018-04-04 NOTE — Progress Notes (Signed)
Daily Session Note  Patient Details  Name: Kathy [name] MRN: [id number] Date of Birth: 01/09/1941 Referring Provider:     Pulmonary Rehab Walk Test from 12/22/2017 in [facility]  Referring Provider  Dr. Sood      Encounter Date: 04/04/2018  Check In: Session Check In - 04/04/18 1524      Check-In   Supervising physician immediately available to respond to emergencies  [facility] immediately available    Physician(s)  Dr. Ezenduka    Location  [facility]    Staff Present  Molly DiVincenzo, MS, ACSM RCEP, Exercise Physiologist; , RN;Annedrea Stackhouse, RN, MHA    Medication changes reported      No    Fall or balance concerns reported     No    Warm-up and Cool-down  Performed as group-led instruction    Resistance Training Performed  Yes    VAD Patient?  No    PAD/SET Patient?  No      Pain Assessment   Currently in Pain?  No/denies    Multiple Pain Sites  No       Capillary Blood Glucose: No results found for this or any previous visit (from the past 24 hour(s)).    Social History   Tobacco Use  Smoking Status Former Smoker  . Packs/day: 1.00  . Years: 29.00  . Pack years: 29.00  . Types: Cigarettes  . Last attempt to quit: 06/28/1986  . Years since quitting: 31.7  Smokeless Tobacco Never Used  Tobacco Comment   quit 40+ yrs ago, 1 PPD for a few years    Goals Met:  Exercise tolerated well No report of cardiac concerns or symptoms Strength training completed today  Goals Unmet:  Not Applicable  Comments: Service time is from 1330 to 1500    Dr. Wesam G. Yacoub is Medical Director for Pulmonary Rehab at [facility]. 

## 2018-04-05 ENCOUNTER — Encounter (HOSPITAL_COMMUNITY): Payer: Self-pay | Admitting: *Deleted

## 2018-04-05 ENCOUNTER — Telehealth: Payer: Self-pay | Admitting: Pulmonary Disease

## 2018-04-05 DIAGNOSIS — J849 Interstitial pulmonary disease, unspecified: Secondary | ICD-10-CM

## 2018-04-05 NOTE — Telephone Encounter (Signed)
Spoke with Lattie Haw at [facility]  She states that the pt has graduated from rehab and now wants to to maintenance rehab  Order was placed Nothing further needed

## 2018-04-06 ENCOUNTER — Encounter (HOSPITAL_COMMUNITY)
Admission: RE | Admit: 2018-04-06 | Discharge: 2018-04-06 | Disposition: A | Discharge status: Home / Self Care | Payer: Medicare Other | Source: Ambulatory Visit | Attending: Pulmonary Disease | Admitting: Pulmonary Disease

## 2018-04-06 DIAGNOSIS — J849 Interstitial pulmonary disease, unspecified: Secondary | ICD-10-CM | POA: Diagnosis not present

## 2018-04-06 NOTE — Progress Notes (Signed)
Daily Session Note  Patient Details  Name: Kathy [name] MRN: [id number] Date of Birth: 01/26/1941 Referring Provider:     Pulmonary Rehab Walk Test from 12/22/2017 in [facility]  Referring Provider  Dr. Sood      Encounter Date: 04/06/2018  Check In: Session Check In - 04/06/18 1356      Check-In   Supervising physician immediately available to respond to emergencies  [facility] immediately available    Physician(s)  Dr. Purohit    Location  [facility]    Staff Present  Molly DiVincenzo, MS, ACSM RCEP, Exercise Physiologist; , RN;Annedrea Stackhouse, RN, MHA;Dalton Fletcher, MS, Exercise Physiologist    Medication changes reported      No    Fall or balance concerns reported     No    Tobacco Cessation  No Change    Warm-up and Cool-down  Performed as group-led instruction    Resistance Training Performed  Yes    VAD Patient?  No    PAD/SET Patient?  No      Pain Assessment   Currently in Pain?  No/denies    Multiple Pain Sites  No       Capillary Blood Glucose: No results found for this or any previous visit (from the past 24 hour(s)).    Social History   Tobacco Use  Smoking Status Former Smoker  . Packs/day: 1.00  . Years: 29.00  . Pack years: 29.00  . Types: Cigarettes  . Last attempt to quit: 06/28/1986  . Years since quitting: 31.7  Smokeless Tobacco Never Used  Tobacco Comment   quit 40+ yrs ago, 1 PPD for a few years    Goals Met:  Exercise tolerated well No report of cardiac concerns or symptoms Strength training completed today  Goals Unmet:  Not Applicable  Comments: Service time is from 1330 to 1540    Dr. Wesam G. Yacoub is Medical Director for Pulmonary Rehab at [facility]. 

## 2018-04-11 ENCOUNTER — Encounter (HOSPITAL_COMMUNITY)
Admission: RE | Admit: 2018-04-11 | Discharge: 2018-04-11 | Disposition: A | Discharge status: Home / Self Care | Payer: Medicare Other | Source: Ambulatory Visit | Attending: Pulmonary Disease | Admitting: Pulmonary Disease

## 2018-04-11 DIAGNOSIS — J849 Interstitial pulmonary disease, unspecified: Secondary | ICD-10-CM | POA: Diagnosis not present

## 2018-04-13 ENCOUNTER — Telehealth: Payer: Self-pay | Admitting: Pulmonary Disease

## 2018-04-13 ENCOUNTER — Encounter (HOSPITAL_COMMUNITY): Payer: Medicare Other

## 2018-04-13 DIAGNOSIS — J849 Interstitial pulmonary disease, unspecified: Secondary | ICD-10-CM

## 2018-04-13 NOTE — Telephone Encounter (Signed)
Dr. Halford Chessman, please advise if you are okay with this per Lattie Haw with [facility]. Thanks!

## 2018-04-14 ENCOUNTER — Encounter: Payer: Self-pay | Admitting: Adult Health

## 2018-04-14 ENCOUNTER — Ambulatory Visit (INDEPENDENT_AMBULATORY_CARE_PROVIDER_SITE_OTHER): Payer: Medicare Other | Admitting: Adult Health

## 2018-04-14 ENCOUNTER — Other Ambulatory Visit (INDEPENDENT_AMBULATORY_CARE_PROVIDER_SITE_OTHER): Payer: Medicare Other

## 2018-04-14 VITALS — BP 126/68 | HR 67 | Ht 61.0 in | Wt 264.0 lb

## 2018-04-14 DIAGNOSIS — R0609 Other forms of dyspnea: Secondary | ICD-10-CM

## 2018-04-14 DIAGNOSIS — R21 Rash and other nonspecific skin eruption: Secondary | ICD-10-CM

## 2018-04-14 DIAGNOSIS — J849 Interstitial pulmonary disease, unspecified: Secondary | ICD-10-CM

## 2018-04-14 DIAGNOSIS — J209 Acute bronchitis, unspecified: Secondary | ICD-10-CM

## 2018-04-14 DIAGNOSIS — J9611 Chronic respiratory failure with hypoxia: Secondary | ICD-10-CM | POA: Diagnosis not present

## 2018-04-14 DIAGNOSIS — I5189 Other ill-defined heart diseases: Secondary | ICD-10-CM

## 2018-04-14 LAB — CBC WITH DIFFERENTIAL/PLATELET
BASOS ABS: 0.2 10*3/uL — AB (ref 0.0–0.1)
Basophils Relative: 1.3 % (ref 0.0–3.0)
Eosinophils Absolute: 0.5 10*3/uL (ref 0.0–0.7)
Eosinophils Relative: 2.8 % (ref 0.0–5.0)
HEMATOCRIT: 34.7 % — AB (ref 36.0–46.0)
HEMOGLOBIN: 11.7 g/dL — AB (ref 12.0–15.0)
Lymphocytes Relative: 13.8 % (ref 12.0–46.0)
Lymphs Abs: 2.3 10*3/uL (ref 0.7–4.0)
MCHC: 33.7 g/dL (ref 30.0–36.0)
MCV: 95 fl (ref 78.0–100.0)
MONOS PCT: 5.6 % (ref 3.0–12.0)
Monocytes Absolute: 0.9 10*3/uL (ref 0.1–1.0)
Neutro Abs: 12.8 10*3/uL — ABNORMAL HIGH (ref 1.4–7.7)
Neutrophils Relative %: 76.5 % (ref 43.0–77.0)
Platelets: 223 10*3/uL (ref 150.0–400.0)
RBC: 3.65 Mil/uL — ABNORMAL LOW (ref 3.87–5.11)
RDW: 13.4 % (ref 11.5–15.5)
WBC: 16.7 10*3/uL — AB (ref 4.0–10.5)

## 2018-04-14 LAB — BASIC METABOLIC PANEL
BUN: 20 mg/dL (ref 6–23)
CALCIUM: 8.9 mg/dL (ref 8.4–10.5)
CO2: 30 meq/L (ref 19–32)
Chloride: 100 mEq/L (ref 96–112)
Creatinine, Ser: 1.2 mg/dL (ref 0.40–1.20)
GFR: 46.26 mL/min — AB (ref >60.00)
Glucose, Bld: 168 mg/dL — ABNORMAL HIGH (ref 70–99)
Potassium: 3.9 mEq/L (ref 3.5–5.1)
Sodium: 140 mEq/L (ref 135–145)

## 2018-04-14 LAB — BRAIN NATRIURETIC PEPTIDE: PRO B NATRI PEPTIDE: 85 pg/mL (ref 0.0–100.0)

## 2018-04-14 NOTE — Progress Notes (Signed)
@Patient  ID: Kathy [name], female    DOB: 1941/05/31, 77 y.o.   MRN: [id number]  Chief Complaint  Patient presents with  . Follow-up    Referring provider: Biagio Borg, MD  HPI: 77 yo female former smoker ( 1988) seen for pulmonary consult 02/15/17 for dyspnea and hypoxiaFound to have ILD on CT chest . O2 RF on O2 at 2l/m w/ act andAt bedtime Has OSA -CPAP intolerant .   TEST/Events  ILD workup : 03/01/17 >> ANA negative, RF < 14, CCP < 16, ANCA negative, SSA/SSB < 0.63m HSP neg  HRCT showed 3 mm left upper lobe nodule, patchy air trapping bilaterally, patchy reticulation and groundglass opacities bilaterally with minimum traction bronchiectasis. This was concerning for ILD with a possible chronic hypersensitivity pneumonitis component.  PFT was done today that showed moderate restriction with no airflow obstruction. FEV1 was 82%, ratio 96, FVC 63%, DLCO 70%, no significant bronchodilator response. Total lung capacity 64%  Worked at [employer] for 6 yr . No Amiodarone , macrobid , or MTX use/exposure.  Second hand smoke from husband.  Echo EF 65-70%, Grade 1 DD .  Aspergillus IgE Panel 07/20/17 >> negative IgE 07/20/17 >> 44  Pulmonary Rehab 09/2017 referral >completed, maintence   04/14/2018 Follow up : ILD , Bronchitis , O2 RF Patient returns for a 2-week follow-up.  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  She was seen last visit for a suspected acute bronchitis  and diastolic dysfunction.  She was recommend to increase her prednisone to 20 mg and 7 days of Augmentin.  Says she is feeling better.  Cough and congestion have decreased.  Breathing is returning back to her baseline. Chest x-ray last visit showed chronic changes without acute process noted.  No change in her oxygen levels.  Remains on 2 L of oxygen..   Says that she noticed that she had a rash last night.  Somewhat itchy.  Has improved today.  No dysphagia, chest pain, wheezing  or lip or tongue swelling.  Patient has noticed that over the last few days her ankles have been more swollen.  She denies any chest pain or orthopnea.  She is on Lasix 40 mg daily with option to take an extra one if needed.  Weight has trended up 8 pounds since last visit.  Allergies  Allergen Reactions  . Lipitor [Atorvastatin] Other (See Comments)    Memory issues  . Requip [Ropinirole Hcl] Other (See Comments)    Pt reports feeling generally unwell on this medication    Immunization History  Administered Date(s) Administered  . Influenza, High Dose Seasonal PF 04/04/2017, 03/14/2018  . Pneumococcal Conjugate-13 11/30/2017  . Pneumococcal Polysaccharide-23 06/30/2016  . Tdap 11/30/2017    Past Medical History:  Diagnosis Date  . Arthritis    fingers  . CKD (chronic kidney disease) stage 3, GFR 30-59 ml/min (HCC) 11/30/2017  . Depression   . Dizziness    in AM, getting out of bed  . Dysrhythmia    "skips a beat" sometimes - followed by PCP  . Endometrial ca ([place]) 11/30/2017   S/p surgury 1990's  . GERD (gastroesophageal reflux disease) 11/30/2017  . HLD (hyperlipidemia) 11/30/2017  . Hypercholesteremia   . Hypertension   . Neuropathy    bilateral feet  . Shortness of breath dyspnea   . Sleep apnea    has CPAP, doesn't use  . Umbilical hernia     Tobacco History: Social History  Tobacco Use  Smoking Status Former Smoker  . Packs/day: 1.00  . Years: 29.00  . Pack years: 29.00  . Types: Cigarettes  . Last attempt to quit: 06/28/1986  . Years since quitting: 31.8  Smokeless Tobacco Never Used  Tobacco Comment   quit 40+ yrs ago, 1 PPD for a few years   Counseling given: Not Answered Comment: quit 40+ yrs ago, 1 PPD for a few years   Outpatient Medications Prior to Visit  Medication Sig Dispense Refill  . albuterol (PROVENTIL HFA;VENTOLIN HFA) 108 (90 Base) MCG/ACT inhaler Inhale 1-2 puffs into the lungs every 6 (six) hours as needed for wheezing or shortness of  breath. 1 Inhaler 2  . benzonatate (TESSALON PERLES) 100 MG capsule Take 1 capsule (100 mg total) by mouth 2 (two) times daily as needed for cough. 60 capsule 5  . etodolac (LODINE) 300 MG capsule Take 300 mg by mouth 2 (two) times daily.    . fenofibrate micronized (LOFIBRA) 134 MG capsule Take 134 mg by mouth daily before breakfast.    . furosemide (LASIX) 40 MG tablet 1 tab by mouth in the AM, and 1 tab by mouth in the PM as needed for persistent swelling or weight gain more than 3-5 lbs 60 tablet 11  . losartan (COZAAR) 100 MG tablet Take 50 mg by mouth daily.    . metoprolol succinate (TOPROL-XL) 50 MG 24 hr tablet Take 1 tablet (50 mg total) by mouth daily. 90 tablet 1  . predniSONE (DELTASONE) 20 MG tablet Take 1 tablet (20 mg total) by mouth daily with breakfast. 30 tablet 5  . venlafaxine XR (EFFEXOR-XR) 150 MG 24 hr capsule Take 1 capsule (150 mg total) by mouth daily. 90 capsule 3   No facility-administered medications prior to visit.      Review of Systems  Constitutional:   No  weight loss, night sweats,  Fevers, chills, fatigue, or  lassitude.  HEENT:   No headaches,  Difficulty swallowing,  Tooth/dental problems, or  Sore throat,                No sneezing, itching, ear ache, nasal congestion, post nasal drip,   CV:  No chest pain,  Orthopnea, PND, +swelling in lower extremities,  No anasarca, dizziness, palpitations, syncope.   GI  No heartburn, indigestion, abdominal pain, nausea, vomiting, diarrhea, change in bowel habits, loss of appetite, bloody stools.   Resp:   No chest wall deformity  Skin: no rash or lesions.  GU: no dysuria, change in color of urine, no urgency or frequency.  No flank pain, no hematuria   MS:  No joint pain or swelling.  No decreased range of motion.  No back pain.    Physical Exam    GEN: A/Ox3; pleasant , NAD, elderly on oxygen   HEENT:  [place]/AT,  EACs-clear, TMs-wnl, NOSE-clear, THROAT-clear, no lesions, no postnasal drip or exudate  noted.   NECK:  Supple w/ fair ROM; no JVD; normal carotid impulses w/o bruits; no thyromegaly or nodules palpated; no lymphadenopathy.    RESP decreased breath sounds in the bases  no accessory muscle use, no dullness to percussion  CARD:  RRR, no m/r/g, 1+  peripheral edema, pulses intact, no cyanosis or clubbing.  GI:   Soft & nt; nml bowel sounds; no organomegaly or masses detected.   Musco: Warm bil, no deformities or joint swelling noted.   Neuro: alert, no focal deficits noted.    Skin: Warm, very fine  rash noted mostly on torso under her bra and stomach    Lab Results:  CBC  BNP Imaging: Dg Chest 2 View  Result Date: 03/31/2018 CLINICAL DATA:  Cough, congestion, sob x 1 wk, HTN, hx of ILD. EXAM: CHEST - 2 VIEW COMPARISON:  High-resolution chest CT dated 01/11/2018. Chest x-ray dated 11/07/2017. FINDINGS: Heart size and mediastinal contours are stable. Coarse lung markings again noted bilaterally, most prominent at the LEFT lung base, compatible with history of chronic interstitial lung disease. No new confluent opacity to suggest a developing pneumonia. No pleural effusion or pneumothorax seen. No acute or suspicious osseous finding. IMPRESSION: 1. No active cardiopulmonary disease. No evidence of pneumonia or pulmonary edema. 2. Chronic interstitial lung disease, stable appearance compared to the earlier chest x-ray of 11/07/2017, better demonstrated on high-resolution chest CT of 01/11/2018. Electronically Signed   By: Franki Cabot M.D.   On: 03/31/2018 12:24     Assessment & Plan:   No problem-specific Assessment & Plan notes found for this encounter.     Rexene Edison, NP 04/14/2018

## 2018-04-14 NOTE — Patient Instructions (Addendum)
Zyrtec 10mg  . At bedtime  For 5 days  Pepcid 20mg  At bedtime  For 5 days .  Labs today . Extra Lasix for next 3 days . Low salt diet legs elevated.  Decrease Prednisone 20mg  alternating with 10mg  daily for 1 week then 10mg  daily -hold at this dose.  Continue on Oxygen 2l/m .  Follow up next month with Dr. Halford Chessman  And As needed   Please contact office for sooner follow up if symptoms do not improve or worsen or seek emergency care

## 2018-04-17 ENCOUNTER — Other Ambulatory Visit: Payer: Self-pay | Admitting: Adult Health

## 2018-04-17 DIAGNOSIS — R21 Rash and other nonspecific skin eruption: Secondary | ICD-10-CM | POA: Insufficient documentation

## 2018-04-17 DIAGNOSIS — R0602 Shortness of breath: Secondary | ICD-10-CM

## 2018-04-17 NOTE — Assessment & Plan Note (Signed)
Appears self limiting ,appears to be resolving  ? Etiology  tx w/ 5 days of zyrtec and pepcid   Plan  Patient Instructions  Zyrtec 10mg  . At bedtime  For 5 days  Pepcid 20mg  At bedtime  For 5 days .  Labs today . Extra Lasix for next 3 days . Low salt diet legs elevated.  Decrease Prednisone 20mg  alternating with 10mg  daily for 1 week then 10mg  daily -hold at this dose.  Continue on Oxygen 2l/m .  Follow up next month with Dr. Halford Chessman  And As needed   Please contact office for sooner follow up if symptoms do not improve or worsen or seek emergency care

## 2018-04-17 NOTE — Telephone Encounter (Signed)
Order has been placed for the pulm rehab maintenance program. Nothing further needed.

## 2018-04-17 NOTE — Assessment & Plan Note (Signed)
Mild decompensation  Gentle diuresis . - briefly as she has CKD  Check labs today   Plan  Patient Instructions  Zyrtec 10mg  . At bedtime  For 5 days  Pepcid 20mg  At bedtime  For 5 days .  Labs today . Extra Lasix for next 3 days . Low salt diet legs elevated.  Decrease Prednisone 20mg  alternating with 10mg  daily for 1 week then 10mg  daily -hold at this dose.  Continue on Oxygen 2l/m .  Follow up next month with Dr. Halford Chessman  And As needed   Please contact office for sooner follow up if symptoms do not improve or worsen or seek emergency care

## 2018-04-17 NOTE — Assessment & Plan Note (Signed)
Resolved with antibiotics continue on current regimen

## 2018-04-17 NOTE — Assessment & Plan Note (Signed)
Mild flare with recent bronchitis now improving will start to taper prednisone.  Back to baseline of 10 mg daily.  Will discuss on return ongoing use of steroids patient education given  Plan  Patient Instructions  Zyrtec 10mg  . At bedtime  For 5 days  Pepcid 20mg  At bedtime  For 5 days .  Labs today . Extra Lasix for next 3 days . Low salt diet legs elevated.  Decrease Prednisone 20mg  alternating with 10mg  daily for 1 week then 10mg  daily -hold at this dose.  Continue on Oxygen 2l/m .  Follow up next month with Kathy [name]  And As needed   Please contact office for sooner follow up if symptoms do not improve or worsen or seek emergency care

## 2018-04-17 NOTE — Telephone Encounter (Signed)
Okay to place order for pulmonary rehab maintenance program.

## 2018-04-17 NOTE — Assessment & Plan Note (Signed)
Cont on o2 .  

## 2018-05-16 ENCOUNTER — Ambulatory Visit (INDEPENDENT_AMBULATORY_CARE_PROVIDER_SITE_OTHER): Payer: Medicare Other | Admitting: Pulmonary Disease

## 2018-05-16 ENCOUNTER — Encounter (HOSPITAL_COMMUNITY)
Admission: RE | Admit: 2018-05-16 | Discharge: 2018-05-16 | Disposition: A | Discharge status: Home / Self Care | Payer: Medicare Other | Source: Ambulatory Visit | Attending: Pulmonary Disease | Admitting: Pulmonary Disease

## 2018-05-16 ENCOUNTER — Encounter: Payer: Self-pay | Admitting: Pulmonary Disease

## 2018-05-16 VITALS — BP 134/90 | HR 124 | Ht 60.0 in | Wt 264.6 lb

## 2018-05-16 DIAGNOSIS — Z6841 Body Mass Index (BMI) 40.0 and over, adult: Secondary | ICD-10-CM

## 2018-05-16 DIAGNOSIS — J849 Interstitial pulmonary disease, unspecified: Secondary | ICD-10-CM

## 2018-05-16 DIAGNOSIS — M7989 Other specified soft tissue disorders: Secondary | ICD-10-CM | POA: Diagnosis not present

## 2018-05-16 DIAGNOSIS — J9611 Chronic respiratory failure with hypoxia: Secondary | ICD-10-CM | POA: Diagnosis not present

## 2018-05-16 DIAGNOSIS — R002 Palpitations: Secondary | ICD-10-CM | POA: Diagnosis not present

## 2018-05-16 DIAGNOSIS — M549 Dorsalgia, unspecified: Secondary | ICD-10-CM | POA: Diagnosis not present

## 2018-05-16 MED ORDER — PREDNISONE 5 MG PO TABS: 5.0000 mg | ORAL_TABLET | Freq: Every day | ORAL | 5 refills | Status: DC

## 2018-05-16 NOTE — Progress Notes (Signed)
[facility]  Chief Complaint  Patient presents with  . Follow-up    Constitutional:  BP 134/90 (BP Location: Left Arm, Cuff Size: Normal)   Pulse (!) 124   Ht 5' (1.524 m)   Wt 264 lb 9.6 oz (120 kg)   SpO2 94%   BMI 51.68 kg/m   Past Medical History:  CKD 3, Depression, Palpitations, Endometrial cancer, GERD, HLD, HTN, Peripheral neuropathy  Brief Summary:  Kathy [name] is a 77 y.o. 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  She has noticed more trouble with her breathing.  Getting winded easier.  Has cough with clear sputum.  Gets dry sinus and nasal congestion.  Has noticed more leg swelling.  Has gained about 10 lbs in past 1 month.  She was on 20 mg prednisone last month.  Didn't change her breathing symptoms, but did cause her to retain fluid.  She takes lasix 40 mg daily.  She hasn't experience recurrence of her rash.  She is not having fever, chest pain, hemoptysis, or joint swelling.  She is currently on 10 mg prednisone daily.  She uses 2 liters oxygen.  She is working with therapist at pulmonary rehab and went there earlier today.  She has noticed more lower back and hip pain over the past couple of days.  This started when she bent over.  When she did this she heard a crunching sound in her back and lower rib area.  She has also noticed mild tremor in her hands and legs.  Her father had Parkinson's disease.   Physical Exam:   Appearance - wearing oxygen  ENMT - clear nasal mucosa, midline nasal  septum, no oral exudates, no LAN, trachea midline  Respiratory - normal chest wall, normal respiratory effort, no accessory muscle use, few faint inspiratory squeaks more in upper lung areas, no wheezing  CV - irregular, no murmur, 2+ lower leg edema  GI - soft, non tender, no masses  Lymph - no adenopathy noted in neck and axillary areas  MSK - normal  gait  Ext - no cyanosis, clubbing, or joint inflammation noted  Skin - venous stasis changes  Neuro - normal strength, oriented x 3  Psych - normal mood and affect  CMP Latest Ref Rng & Units 04/14/2018 12/12/2017 12/05/2017  Glucose 70 - 99 mg/dL 168(H) 107(H) 109(H)  BUN 6 - 23 mg/dL 20 25(H) 19  Creatinine 0.40 - 1.20 mg/dL 1.20 1.29(H) 1.06  Sodium 135 - 145 mEq/L 140 139 141  Potassium 3.5 - 5.1 mEq/L 3.9 3.8 3.9  Chloride 96 - 112 mEq/L 100 98 101  CO2 19 - 32 mEq/L 30 30 32  Calcium 8.4 - 10.5 mg/dL 8.9 9.1 8.9  Total Protein 6.0 - 8.3 g/dL - - -  Total Bilirubin 0.2 - 1.2 mg/dL - - -  Alkaline Phos 39 - 117 U/L - - -  AST 0 - 37 U/L - - -  ALT 0 - 35 U/L - - -    CBC Latest Ref Rng & Units 04/14/2018 11/30/2017 07/20/2017  WBC 4.0 - 10.5 K/uL 16.7(H) 13.1(H) 10.2  Hemoglobin 12.0 - 15.0 g/dL 11.7(L) 13.5 12.7  Hematocrit 36.0 - 46.0 % 34.7(L) 40.1 37.7  Platelets 150.0 - 400.0 K/uL 223.0 314.0 283.0   CXR 03/31/18 (reviewed by me) >> stable changes of ILD  ECG 05/16/18 >> sinus rhythm with PVCs  Discussion:  She has progressive dyspnea associated with increased weight and leg swelling.  She didn't notice any improvement with recent increase in prednisone dose.    Assessment/Plan:   Edema. - suspect volume overload is contributing to her dyspnea - will have her increase lasix to 40 mg daily for next 3 days, then back to 40 mg daily - advised her to keep track of her weight on daily basis at same time of the day  ILD with NSIP pattern. - negative serology - not sure how much prednisone is helping at this point - will change to prednisone 5 mg daily  Chronic respiratory failure with hypoxia from ILD. -- continue 2 liters oxygen 24/7  Muscular deconditioning, obesity. - continue pulmonary rehab  Back pain. - advised her to f/u with her PCP to further assess  Tremor. - her father had Parkinson's disease and she is worried that she could be getting this  also - advised her to d/w her PCP   Patient Instructions  Increase lasix to 40 mg twice per day for next 3 days, and then go back to 40 mg daily  Change prednisone to 5 mg daily  Talk to your primary care doctor about your back pain  Follow up in 3 months    Chesley Mires, MD [place] Pager: [phone] 05/16/2018, 12:22 PM  Flow Sheet     Pulmonary tests:  Serology 03/01/17 >> ANA negative, RF < 14, CCP < 16, ANCA negative, SSA/SSB < 0.2 PFT 03/15/17 >> FEV1 1.40 (82%), FEV1% 96, TLC 2.86 (64%), DLCO 70% HP panel 03/15/17 >> negative Aspergillus IgE Panel 07/20/17 >> negative IgE 07/20/17 >> 44 PFT 03/14/18 >> FEV1 1.28 (76%), FEV1% 91, DLCO 59%  Chest imaging:  HRCT chest 02/22/17 >> atherosclerosis, 3 mm LUL nodule, patchy air trapping b/l, patchy reticulation and GGO b/l, minimal traction BTX HRCT chest 01/12/18 >> 3 mm nodule Lt apex, patchy air trapping, patchy subpleural reticulation and GGO, mild traction BTX  Cardiac tests:  Echo 03/21/17 >> EF 65 to 70%, grade 1 DD  Medications:   Allergies as of 05/16/2018      Reactions   Lipitor [atorvastatin] Other (See Comments)   Memory issues   Requip [ropinirole Hcl] Other (See Comments)   Pt reports feeling generally unwell on this medication      Medication List        Accurate as of 05/16/18 12:22 PM. Always use your most recent med list.          albuterol 108 (90 Base) MCG/ACT inhaler Commonly known as:  PROVENTIL HFA;VENTOLIN HFA Inhale 1-2 puffs into the lungs every 6 (six) hours as needed for wheezing or shortness of breath.   benzonatate 100 MG capsule Commonly known as:  TESSALON Take 1 capsule (100 mg total) by mouth 2 (two) times daily as needed for cough.   etodolac 300 MG capsule Commonly known as:  LODINE Take 300 mg by mouth 2 (two) times daily.   fenofibrate micronized 134 MG capsule Commonly known as:  LOFIBRA Take 134 mg by mouth daily before breakfast.    furosemide 40 MG tablet Commonly known as:  LASIX 1 tab by mouth in the AM, and 1 tab by mouth in the PM as needed for persistent swelling or weight gain more than 3-5 lbs   losartan 100 MG tablet Commonly known as:  COZAAR Take 50 mg by mouth daily.   metoprolol succinate 50 MG 24 hr tablet Commonly known as:  TOPROL-XL  Take 1 tablet (50 mg total) by mouth daily.   predniSONE 5 MG tablet Commonly known as:  DELTASONE Take 1 tablet (5 mg total) by mouth daily with breakfast.   venlafaxine XR 150 MG 24 hr capsule Commonly known as:  EFFEXOR-XR Take 1 capsule (150 mg total) by mouth daily.       Past Surgical History:  She  has a past surgical history that includes Tonsillectomy; Cholecystectomy; Abdominal hysterectomy; Hernia repair; Knee arthroscopy (Bilateral); Hammer toe surgery; Brow lift (Bilateral, 07/15/2015); and Ptosis repair (Bilateral, 07/15/2015).  Family History:  Her family history includes Congestive Heart Failure in her mother; Parkinson's disease in her father; Stroke in her father.  Social History:  She  reports that she quit smoking about 31 years ago. Her smoking use included cigarettes. She has a 29.00 pack-year smoking history. She has never used smokeless tobacco. She reports that she does not drink alcohol or use drugs.

## 2018-05-16 NOTE — Patient Instructions (Signed)
Increase lasix to 40 mg twice per day for next 3 days, and then go back to 40 mg daily  Change prednisone to 5 mg daily  Talk to your primary care doctor about your back pain  Follow up in 3 months

## 2018-05-16 NOTE — Progress Notes (Signed)
Kathy [name] started the Pulmonary Maintenance program today. She is using the nu-step to exercise all three stations. Kathy [name] c/o of knee pain and states that she is not able to walk much due to this. We discussed this and hopefully we can work on getting her to walk some. She states that she feels like she pulled something yesterday just bending over. She has felt pain in her back and abdomen area since then. I encouraged her to contact her PCP if this did not improve. Kathy [name] tolerated the nu-step today without any problems.

## 2018-05-18 ENCOUNTER — Encounter (HOSPITAL_COMMUNITY): Payer: Medicare Other

## 2018-05-23 ENCOUNTER — Other Ambulatory Visit: Payer: Self-pay

## 2018-05-23 ENCOUNTER — Ambulatory Visit: Payer: Self-pay | Admitting: *Deleted

## 2018-05-23 ENCOUNTER — Emergency Department (HOSPITAL_COMMUNITY): Payer: Medicare Other

## 2018-05-23 ENCOUNTER — Emergency Department (HOSPITAL_COMMUNITY)
Admission: EM | Admit: 2018-05-23 | Discharge: 2018-05-23 | Disposition: A | Discharge status: Home / Self Care | Payer: Medicare Other | Attending: Emergency Medicine | Admitting: Emergency Medicine

## 2018-05-23 ENCOUNTER — Encounter (HOSPITAL_COMMUNITY)
Admission: RE | Admit: 2018-05-23 | Discharge: 2018-05-23 | Disposition: A | Discharge status: Home / Self Care | Payer: Medicare Other | Source: Ambulatory Visit | Attending: Pulmonary Disease | Admitting: Pulmonary Disease

## 2018-05-23 ENCOUNTER — Encounter (HOSPITAL_COMMUNITY): Payer: Self-pay

## 2018-05-23 DIAGNOSIS — Z79899 Other long term (current) drug therapy: Secondary | ICD-10-CM | POA: Insufficient documentation

## 2018-05-23 DIAGNOSIS — J9611 Chronic respiratory failure with hypoxia: Secondary | ICD-10-CM | POA: Diagnosis not present

## 2018-05-23 DIAGNOSIS — I129 Hypertensive chronic kidney disease with stage 1 through stage 4 chronic kidney disease, or unspecified chronic kidney disease: Secondary | ICD-10-CM | POA: Insufficient documentation

## 2018-05-23 DIAGNOSIS — R1033 Periumbilical pain: Secondary | ICD-10-CM | POA: Diagnosis not present

## 2018-05-23 DIAGNOSIS — Z87891 Personal history of nicotine dependence: Secondary | ICD-10-CM | POA: Insufficient documentation

## 2018-05-23 DIAGNOSIS — N3000 Acute cystitis without hematuria: Secondary | ICD-10-CM | POA: Diagnosis not present

## 2018-05-23 DIAGNOSIS — N183 Chronic kidney disease, stage 3 (moderate): Secondary | ICD-10-CM | POA: Insufficient documentation

## 2018-05-23 DIAGNOSIS — R109 Unspecified abdominal pain: Secondary | ICD-10-CM | POA: Diagnosis present

## 2018-05-23 DIAGNOSIS — K5730 Diverticulosis of large intestine without perforation or abscess without bleeding: Secondary | ICD-10-CM | POA: Diagnosis not present

## 2018-05-23 LAB — CBC WITH DIFFERENTIAL/PLATELET
Abs Immature Granulocytes: 0.07 10*3/uL (ref 0.00–0.07)
BASOS PCT: 1 %
Basophils Absolute: 0 10*3/uL (ref 0.0–0.1)
EOS ABS: 0.1 10*3/uL (ref 0.0–0.5)
EOS PCT: 2 %
HEMATOCRIT: 44.3 % (ref 36.0–46.0)
Hemoglobin: 13.6 g/dL (ref 12.0–15.0)
IMMATURE GRANULOCYTES: 1 %
Lymphocytes Relative: 24 %
Lymphs Abs: 1.8 10*3/uL (ref 0.7–4.0)
MCH: 30.4 pg (ref 26.0–34.0)
MCHC: 30.7 g/dL (ref 30.0–36.0)
MCV: 99.1 fL (ref 80.0–100.0)
MONOS PCT: 8 %
Monocytes Absolute: 0.6 10*3/uL (ref 0.1–1.0)
NEUTROS PCT: 64 %
Neutro Abs: 4.9 10*3/uL (ref 1.7–7.7)
PLATELETS: 217 10*3/uL (ref 150–400)
RBC: 4.47 MIL/uL (ref 3.87–5.11)
RDW: 12.6 % (ref 11.5–15.5)
WBC: 7.5 10*3/uL (ref 4.0–10.5)
nRBC: 0 % (ref 0.0–0.2)

## 2018-05-23 LAB — URINALYSIS, ROUTINE W REFLEX MICROSCOPIC
BILIRUBIN URINE: NEGATIVE
GLUCOSE, UA: NEGATIVE mg/dL
HGB URINE DIPSTICK: NEGATIVE
Ketones, ur: NEGATIVE mg/dL
NITRITE: NEGATIVE
Protein, ur: NEGATIVE mg/dL
SPECIFIC GRAVITY, URINE: 1.023 (ref 1.005–1.030)
WBC, UA: >50 WBC/hpf — ABNORMAL HIGH (ref 0–5)
pH: 6 (ref 5.0–8.0)

## 2018-05-23 LAB — COMPREHENSIVE METABOLIC PANEL
ALBUMIN: 3.1 g/dL — AB (ref 3.5–5.0)
ALT: 23 U/L (ref 0–44)
ANION GAP: 10 (ref 5–15)
AST: 25 U/L (ref 15–41)
Alkaline Phosphatase: 36 U/L — ABNORMAL LOW (ref 38–126)
BILIRUBIN TOTAL: 0.7 mg/dL (ref 0.3–1.2)
BUN: 24 mg/dL — ABNORMAL HIGH (ref 8–23)
CO2: 30 mmol/L (ref 22–32)
Calcium: 8.9 mg/dL (ref 8.9–10.3)
Chloride: 100 mmol/L (ref 98–111)
Creatinine, Ser: 1.35 mg/dL — ABNORMAL HIGH (ref 0.44–1.00)
GFR calc Af Amer: 44 mL/min — ABNORMAL LOW (ref >60)
GFR calc non Af Amer: 38 mL/min — ABNORMAL LOW (ref >60)
GLUCOSE: 124 mg/dL — AB (ref 70–99)
POTASSIUM: 3.5 mmol/L (ref 3.5–5.1)
SODIUM: 140 mmol/L (ref 135–145)
TOTAL PROTEIN: 6.2 g/dL — AB (ref 6.5–8.1)

## 2018-05-23 LAB — LIPASE, BLOOD: Lipase: 25 U/L (ref 11–51)

## 2018-05-23 MED ORDER — IOHEXOL 300 MG/ML  SOLN
100.0000 mL | Freq: Once | INTRAMUSCULAR | Status: AC | PRN
  Administered 2018-05-23: 100 mL via INTRAVENOUS

## 2018-05-23 MED ORDER — CEPHALEXIN 500 MG PO CAPS: 500.0000 mg | ORAL_CAPSULE | Freq: Two times a day (BID) | ORAL | 0 refills | Status: AC

## 2018-05-23 NOTE — Discharge Instructions (Addendum)
You were evaluated today for abdominal pain. Your CT scan does show evidence of fat containing area without evidence bowel incarceration or strangulation. You do have a UTI. Please take the medications as prescribed. Follow up with your PCP for reevaluation.   Return to the ED with any new or worsening symptoms such as:  Get help right away if: Your pain does not go away as soon as your doctor says it should. You cannot stop throwing up. Your pain is only in areas of your belly, such as the right side or the left lower part of the belly. You have bloody or black poop, or poop that looks like tar. You have very bad pain, cramping, or bloating in your belly. You have signs of not having enough fluid or water in your body (dehydration), such as: Dark pee, very little pee, or no pee. Cracked lips. Dry mouth. Sunken eyes. Sleepiness. Weakness.

## 2018-05-23 NOTE — ED Provider Notes (Signed)
[facility] EMERGENCY DEPARTMENT Provider Note   CSN: [id number] Arrival date & time: 05/23/18  1046  History   Chief Complaint Chief Complaint  Patient presents with  . Abdominal Pain   HPI Kathy [name] is a 77 y.o. 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  Patient states she bent over approximately 10 days ago and felt sudden onset midline abdominal pain.  Patient states she heard "crackling in my belly."  Patient states she has had intermittent pain with movement since incident.  Rates her pain a 3/10.  This is intermittent.  Denies radiation.  States she has had intermittent nausea without emesis.  Patient states she noticed bruising to her midline lower abdominal pannus after the incident. Denies fever, chills, emesis, chest pain, SOB, diarrhea, constipation, dysuria, urinary frequency.  Denies alleviating or aggravating symptoms other than what is stated above.  Admits to chronic back pain.  States this remains unchanged from her chronic back pain.  Denies bowel or bladder incontinence, saddle paresthesia, unintended weight loss, hc IVDU.  Denies abdominal pain radiating to back, or history of aneurysm.  History obtained from patient.  No interpreter was used.    HPI  Past Medical History:  Diagnosis Date  . Arthritis    fingers  . CKD (chronic kidney disease) stage 3, GFR 30-59 ml/min (HCC) 11/30/2017  . Depression   . Dizziness    in AM, getting out of bed  . Dysrhythmia    "skips a beat" sometimes - followed by PCP  . Endometrial ca ([place]) 11/30/2017   S/p surgury 1990's  . GERD (gastroesophageal reflux disease) 11/30/2017  . HLD (hyperlipidemia) 11/30/2017  . Hypercholesteremia   . Hypertension   . Neuropathy    bilateral feet  . Shortness of breath dyspnea   . Sleep apnea    has CPAP,  doesn't use  . Umbilical hernia     Patient Active Problem List   Diagnosis Date Noted  . Rash 04/17/2018  . Acute bronchitis 04/03/2018  . Acute on chronic kidney failure ([place]) 12/12/2017  . Diastolic dysfunction 96/78/9381  . Endometrial ca ([place]) 11/30/2017  . GERD (gastroesophageal reflux disease) 11/30/2017  . HLD (hyperlipidemia) 11/30/2017  . CKD (chronic kidney disease) stage 3, GFR 30-59 ml/min (HCC) 11/30/2017  . Dyspnea on exertion 11/07/2017  . Cough 11/07/2017  . ILD (interstitial lung disease) ([place]) 03/15/2017  . Chronic respiratory failure with hypoxia ([place]) 03/15/2017  . Oral herpes simplex infection   . Influenza with pneumonia 06/28/2016  . Community acquired pneumonia 06/27/2016  . Hypertension 06/27/2016  . Depression 06/27/2016  . Sleep apnea 06/27/2016    Past Surgical History:  Procedure Laterality Date  . ABDOMINAL HYSTERECTOMY    . BROW LIFT Bilateral 07/15/2015   Procedure: BLEPHAROPLASTY;  Surgeon: Karle Starch, MD;  Location: [facility];  Service: Ophthalmology;  Laterality: Bilateral;  . CHOLECYSTECTOMY    . HAMMER TOE SURGERY    . HERNIA REPAIR    . KNEE ARTHROSCOPY Bilateral   . PTOSIS REPAIR Bilateral 07/15/2015   Procedure: PTOSIS REPAIR;  Surgeon: Karle Starch, MD;  Location: [place];  Service: Ophthalmology;  Laterality: Bilateral;  CPAP  . TONSILLECTOMY       OB History   None      Home Medications    Prior to Admission medications   Medication Sig  Start Date End Date Taking? Authorizing Provider  albuterol (PROVENTIL HFA;VENTOLIN HFA) 108 (90 Base) MCG/ACT inhaler Inhale 1-2 puffs into the lungs every 6 (six) hours as needed for wheezing or shortness of breath. 11/17/17   Lauraine Rinne, NP  benzonatate (TESSALON PERLES) 100 MG capsule Take 1 capsule (100 mg total) by mouth 2 (two) times daily as needed for cough. 11/30/17 11/30/18  Biagio Borg, MD  cephALEXin (KEFLEX) 500 MG capsule Take 1 capsule (500 mg total)  by mouth 2 (two) times daily for 7 days. 05/23/18 05/30/18  Ramon Brant A, PA-C  etodolac (LODINE) 300 MG capsule Take 300 mg by mouth 2 (two) times daily.    [provider]  fenofibrate micronized (LOFIBRA) 134 MG capsule Take 134 mg by mouth daily before breakfast.    [provider]  furosemide (LASIX) 40 MG tablet 1 tab by mouth in the AM, and 1 tab by mouth in the PM as needed for persistent swelling or weight gain more than 3-5 lbs 12/12/17   Biagio Borg, MD  losartan (COZAAR) 100 MG tablet Take 50 mg by mouth daily.    [provider]  metoprolol succinate (TOPROL-XL) 50 MG 24 hr tablet Take 1 tablet (50 mg total) by mouth daily. 12/27/17   Biagio Borg, MD  predniSONE (DELTASONE) 5 MG tablet Take 1 tablet (5 mg total) by mouth daily with breakfast. 05/16/18   Chesley Mires, MD  venlafaxine XR (EFFEXOR-XR) 150 MG 24 hr capsule Take 1 capsule (150 mg total) by mouth daily. 11/30/17   Biagio Borg, MD    Family History Family History  Problem Relation Age of Onset  . Congestive Heart Failure Mother   . Stroke Father   . Parkinson's disease Father     Social History Social History   Tobacco Use  . Smoking status: Former Smoker    Packs/day: 1.00    Years: 29.00    Pack years: 29.00    Types: Cigarettes    Last attempt to quit: 06/28/1986    Years since quitting: 31.9  . Smokeless tobacco: Never Used  . Tobacco comment: quit 40+ yrs ago, 1 PPD for a few years  Substance Use Topics  . Alcohol use: No  . Drug use: No     Allergies   Lipitor [atorvastatin] and Requip [ropinirole hcl]   Review of Systems Review of Systems  Constitutional: Negative.   Respiratory: Negative.   Cardiovascular: Negative.   Gastrointestinal: Positive for abdominal pain and nausea. Negative for abdominal distention, anal bleeding, blood in stool, constipation, diarrhea, rectal pain and vomiting.  Genitourinary: Negative.   Musculoskeletal: Negative.        Chronic  back pain.  Skin: Negative.   Neurological: Negative.   All other systems reviewed and are negative.    Physical Exam Updated Vital Signs BP (!) 148/71 (BP Location: Left Arm)   Pulse 73   Temp 98.1 F (36.7 C)   Resp 18   SpO2 98%   Physical Exam  Constitutional: Vital signs are normal. She appears well-developed and well-nourished.  Non-toxic appearance. She does not have a sickly appearance. She does not appear ill. No distress.  HENT:  Head: Normocephalic and atraumatic.  Nose: Nose normal.  Mouth/Throat: Uvula is midline, oropharynx is clear and moist and mucous membranes are normal. No trismus in the jaw. No uvula swelling. No oropharyngeal exudate, posterior oropharyngeal edema, posterior oropharyngeal erythema or tonsillar abscesses. No tonsillar exudate.  Eyes: Pupils  are equal, round, and reactive to light.  Neck: Normal range of motion, full passive range of motion without pain and phonation normal. Neck supple. No neck rigidity. No edema and no erythema present.  Cardiovascular: Normal rate, regular rhythm, normal heart sounds, intact distal pulses and normal pulses. Exam reveals no gallop and no friction rub.  No murmur heard. Pulmonary/Chest: Effort normal and breath sounds normal. No accessory muscle usage or stridor. No tachypnea. No respiratory distress. She has no decreased breath sounds. She has no wheezes. She has no rhonchi. She has no rales.  Abdominal: Soft. Normal appearance and bowel sounds are normal. She exhibits no distension, no pulsatile liver, no fluid wave, no abdominal bruit, no ascites, no pulsatile midline mass and no mass. There is no hepatosplenomegaly. There is tenderness in the periumbilical area. There is no rigidity, no rebound, no guarding, no CVA tenderness, no tenderness at McBurney's point and negative Murphy's sign. No hernia.  Midline , well-healed abdominal surgical scar which appear old.  No drainage, erythema or warmth.  No evidence of  obvious hernia.  Musculoskeletal: Normal range of motion.  No tenderness to palpation to spinous processes or paraspinous muscles to back.  No midline tenderness, step-offs.  Straight leg raise.  Neurological: She is alert. She has normal strength and normal reflexes. No sensory deficit.  Speech is clear and goal oriented, follows commands Normal 5/5 strength in upper and lower extremities bilaterally including dorsiflexion and plantar flexion, strong and equal grip strength Sensation normal to light and sharp touch Moves extremities without ataxia, coordination intact Normal gait Normal balance  Skin: Skin is warm and dry. She is not diaphoretic.  No edema, erythema, warmth.  She does have a small area of ecchymosis to her pannus.  No rashes or lesions.  Psychiatric: She has a normal mood and affect.  Nursing note and vitals reviewed.   ED Treatments / Results  Labs (all labs ordered are listed, but only abnormal results are displayed) Labs Reviewed  COMPREHENSIVE METABOLIC PANEL - Abnormal; Notable for the following components:      Result Value   Glucose, Bld 124 (*)    BUN 24 (*)    Creatinine, Ser 1.35 (*)    Total Protein 6.2 (*)    Albumin 3.1 (*)    Alkaline Phosphatase 36 (*)    GFR calc non Af Amer 38 (*)    GFR calc Af Amer 44 (*)    All other components within normal limits  URINALYSIS, ROUTINE W REFLEX MICROSCOPIC - Abnormal; Notable for the following components:   APPearance HAZY (*)    Leukocytes, UA MODERATE (*)    WBC, UA >50 (*)    Bacteria, UA RARE (*)    All other components within normal limits  URINE CULTURE  CBC WITH DIFFERENTIAL/PLATELET  LIPASE, BLOOD    EKG None  Radiology Ct Abdomen Pelvis W Contrast  Result Date: 05/23/2018 CLINICAL DATA:  Initial evaluation for acute abdominal pain, hernia suspected. EXAM: CT ABDOMEN AND PELVIS WITH CONTRAST TECHNIQUE: Multidetector CT imaging of the abdomen and pelvis was performed using the standard  protocol following bolus administration of intravenous contrast. CONTRAST:  122mL OMNIPAQUE IOHEXOL 300 MG/ML  SOLN COMPARISON:  Prior CT from 08/01/2014. FINDINGS: Lower chest: Mosaic attenuation seen involving the bilateral lung bases, which could reflect multifocal air trapping and/or edema, relatively similar to previous. Superimposed scattered bibasilar atelectasis and/or scarring. Prominent coronary artery calcifications noted within the LAD. Hepatobiliary: Liver demonstrates a normal contrast enhanced  appearance. Gallbladder surgically absent. Mild intrahepatic biliary dilatation likely related to post cholecystectomy changes. Pancreas: Mild diffuse fatty infiltration of the pancreas noted. Pancreas otherwise unremarkable without acute inflammatory changes. Spleen: Subcentimeter hypodensity within the central aspect of the spleen noted, too small the characterize, but of doubtful significance. Spleen otherwise unremarkable. Adrenals/Urinary Tract: Adrenal glands within normal limits. Kidneys equal size with symmetric enhancement. 17 mm right renal cyst noted. No nephrolithiasis, hydronephrosis, or focal enhancing renal mass. No hydroureter. Partially distended bladder within normal limits. Stomach/Bowel: Stomach within normal limits. No evidence for bowel obstruction. Normal appendix. Extensive colonic diverticulosis without evidence for acute diverticulitis. No acute inflammatory changes seen about the bowels. Vascular/Lymphatic: Mild aorto bi-iliac atherosclerotic disease. No aneurysm. Mesenteric vessels patent proximally. No adenopathy. Reproductive: Uterus absent.  Ovaries not discretely identified. Other: No free air or fluid sequelae of prior ventral hernia repair. A small residual and/or recurrent fat containing ventral hernia measuring 1.6 x 7.6 cm present at the ventral abdomen, relatively similar to previous. Neck of the hernia measures 3.6 cm in diameter. No significant associated inflammation. No  herniated loops of bowel. Additional smaller mildly complex fat containing ventral hernia measuring 1.7 cm in diameter seen inferiorly (series 3, image 60). Several loops of bowel closely approximate the base of this hernia without frank bowel herniation. No significant inflammatory changes about this hernia as well. Musculoskeletal: No acute osseous abnormality. No discrete lytic or blastic osseous lesions. Advanced facet arthropathy noted at L4-5 and L5-S1. Grade 1 retrolisthesis of L1 on L2 noted, likely chronic and degenerative. IMPRESSION: 1. Sequelae of prior ventral hernia repair with two small residual and/or recurrent fat containing ventral hernias as detailed above. No significant associated inflammation evident by CT. No herniated loops of bowel or other complication identified. 2. No other acute intra-abdominal or pelvic process. 3. Extensive colonic diverticulosis without evidence for acute diverticulitis. 4. Mild aorto bi-iliac atherosclerotic disease with prominent coronary artery calcifications within the LAD. 5. Bibasilar atelectasis and/or scarring with superimposed mosaic attenuation, similar to previous, and could reflect chronic air trapping. Electronically Signed   By: Jeannine Boga M.D.   On: 05/23/2018 14:35    Procedures Procedures (including critical care time)  Medications Ordered in ED Medications  iohexol (OMNIPAQUE) 300 MG/ML solution 100 mL (100 mLs Intravenous Contrast Given 05/23/18 1348)     Initial Impression / Assessment and Plan / ED Course  I have reviewed the triage vital signs and the nursing notes.  Pertinent labs & imaging results that were available during my care of the patient were reviewed by me and considered in my medical decision making (see chart for details).  77 year old female who appears otherwise well presets for evaluation of abdominal pain. Pain presents x 10 days. Intermittent in nature. Located under her previous ventral hernia. Pain  only present with movement. Afebrile, non septic, non ill appearing. Abdomen non tender, with out rebound, rigidity, guarding. Area of 4cm ecchymosis inferior and right lateral to umbilicus. No obvious hernia on exam. Will obtain labs, urine and CT scan and reevaluate.  Patient does not want anything for pain at this time.  Labs without evidence of leukocytosis, metabolic panel without any electrolyte abnormalities, mild elevation in glucose at 124, creatinine 1.35, this is at patient's baseline.  Lipase 25, Urine consistent with infection, will DC on antibiotics., CT with sequelae of prior ventral hernia repair with two small residual and/or recurrent fat containing ventral hernias as detailed above. No significant associated inflammation evident by CT. No  herniated loops of bowel or other complication identified. 2. No other acute intra-abdominal or pelvic process. 3. Extensive colonic diverticulosis without evidence for acute diverticulitis. 4. Mild aorto bi-iliac atherosclerotic disease with prominent coronary artery calcifications within the LAD. 5. Bibasilar atelectasis and/or scarring with superimposed mosaic attenuation, similar to previous, and could reflect chronic air trapping.  On re-evlaution abdomen non tender without rebound or guarding. Patient is nontoxic, nonseptic appearing, in no apparent distress.  Patient does not meet the SIRS or Sepsis criteria.  On repeat exam patient does not have a surgical abdomin and there are no peritoneal signs.  No indication of appendicitis, bowel obstruction, bowel perforation, cholecystitis, diverticulitis, aneurysm.  Patient discharged home with UTI abx and given strict instructions for follow-up with their primary care physician.  Will place abdominal binder to help support patient's abdomen.  Low suspicion for emergent pathology causing patient's symptoms at this time.  Discussed with patient follow-up with general surgery for evaluation of her abdominal  hernia.  Strict return precautions which should cause her to return to emergency department.  Patient is hemodynamically stable and appropriate for DC home at this time.  I have also discussed reasons to return immediately to the ER.  Patient expresses understanding and agrees with plan.  Patient was seen and evaluated by my attending, Dr. Johnney Killian who agrees with the above treatment, plan and disposition.   Final Clinical Impressions(s) / ED Diagnoses   Final diagnoses:  Periumbilical abdominal pain  Acute cystitis without hematuria    ED Discharge Orders         Ordered    cephALEXin (KEFLEX) 500 MG capsule  2 times daily     05/23/18 1609           Kamalei Roeder A, PA-C 05/23/18 1707    Charlesetta Shanks, MD 05/25/18 1305

## 2018-05-23 NOTE — Telephone Encounter (Signed)
Patient has history of strangulated hernia- patient turned the other day and she heard popping sound inside. Patient states she feels she may have ripped mesh- she is having bruising in her lower abdomen. She is having pain with a lot is burping . Daughter feels she may have torn her hernia repair and need to be seen- she is in significant pain. Call to office- notified [facility]- sending patient to ED- she agrees.  Reason for Disposition . Large bruise of abdominal wall > 2 inches (5 cm)  Answer Assessment - Initial Assessment Questions 1. MECHANISM: "How did the injury happen?"      Stretched- bending forward 2. ONSET: "When did the injury happen?" (Minutes or hours ago)     3-4 days ago 3. LOCATION: "What part of the abdomen is injured?"     Lower abdomen 4. APPEARANCE of INJURY: "What does the injury look like?"     Bruising in lower abdomen 5. PAIN: "Is there any pain?" If so, ask: "How bad is the pain?"  (e.g., Scale 1-10; or mild, moderate, severe)  - MILD - doesn't interfere with normal activities   - MODERATE - interferes with normal activities or awakens from sleep   - SEVERE - patient doesn't want to move (R/O peritonitis, internal bleeding)      Yes- severe 6. SIZE: For cuts, bruises, or swelling, ask: "How large is it?" (e.g., inches or centimeters)     Bruising- size of fist 7. TETANUS: For any breaks in the skin, ask: "When was the last tetanus booster?"     n/a 8. OTHER SYMPTOMS: "Do you have any other symptoms?"     Back pain- patient has hernia repair that may have ruptured- she heard a pop when she bent over and had pain 9. PREGNANCY: "Is there any chance you are pregnant?" "When was your last menstrual period?"     n/a  Protocols used: ABDOMINAL INJURY-A-AH

## 2018-05-23 NOTE — ED Triage Notes (Signed)
Pt states she bent over and pulled something in her abdomen last week. She reports it feels as if her abdominal hernia is pulling. Pt reports nausea yesterday, no vomiting.

## 2018-05-23 NOTE — ED Provider Notes (Addendum)
Medical screening examination/treatment/procedure(s) were conducted as a shared visit with non-physician practitioner(s) and myself.  I personally evaluated the patient during the encounter.  None    Kathy Shanks, MD 05/23/18 1459 Patient was multiple comorbid illnesses.  She bent over 10 days ago and felt a pop sensation in her abdomen.  She is worried that her hernia mesh broke.  She reports she has some pain in the area but if she is lying flat does not have pain.  Patient is alert and appropriate.  No respiratory distress.  Abdomen is soft without guarding.  She does have a small ecchymotic area adjacent to the umbilicus.  There is no associated erythema or warmth.  No evidence of incarcerated hernia.  I agree with plan of management.   Kathy Shanks, MD 05/25/18 [phone]

## 2018-05-23 NOTE — Progress Notes (Signed)
Orthopedic Tech Progress Note Patient Details:  Kathy [name] 12-06-1940 [id number]  Ortho Devices Type of Ortho Device: Abdominal binder Ortho Device/Splint Interventions: Application   Post Interventions Patient Tolerated: Well Instructions Provided: Care of device   [place] Pink 05/23/2018, 4:17 PM

## 2018-05-23 NOTE — ED Notes (Signed)
Attempted IV x1, unsuccessful. Second RN to attempt

## 2018-05-25 LAB — URINE CULTURE

## 2018-05-30 ENCOUNTER — Encounter (HOSPITAL_COMMUNITY)
Admission: RE | Admit: 2018-05-30 | Discharge: 2018-05-30 | Disposition: A | Discharge status: Home / Self Care | Payer: Medicare Other | Source: Ambulatory Visit | Attending: Pulmonary Disease | Admitting: Pulmonary Disease

## 2018-05-30 DIAGNOSIS — L603 Nail dystrophy: Secondary | ICD-10-CM | POA: Diagnosis not present

## 2018-05-30 DIAGNOSIS — J849 Interstitial pulmonary disease, unspecified: Secondary | ICD-10-CM | POA: Insufficient documentation

## 2018-05-30 DIAGNOSIS — L84 Corns and callosities: Secondary | ICD-10-CM | POA: Diagnosis not present

## 2018-05-30 DIAGNOSIS — I739 Peripheral vascular disease, unspecified: Secondary | ICD-10-CM | POA: Diagnosis not present

## 2018-05-30 DIAGNOSIS — M79672 Pain in left foot: Secondary | ICD-10-CM | POA: Diagnosis not present

## 2018-05-30 DIAGNOSIS — M79671 Pain in right foot: Secondary | ICD-10-CM | POA: Diagnosis not present

## 2018-05-30 NOTE — Progress Notes (Signed)
Kathy [name] had a visit to the ED with abdominal pain and UTI. She has been advised to follow up with her primary care physician and a general surgeon to evaluate her abdominal hernia. Kathy [name] was sent home with a abdominal binder to wear. Kathy [name]'s husband came into the Pulmonary Rehab department and states that she will not be able to return until the first of next month. I am discharging her from the Pulmonary Rehab Maintenance  program for now. I encouraged them to let me know when she will be ready to resume and I will get her re enrolled.

## 2018-05-31 ENCOUNTER — Encounter: Payer: Self-pay | Admitting: Internal Medicine

## 2018-06-01 ENCOUNTER — Ambulatory Visit: Payer: Medicare Other | Admitting: Internal Medicine

## 2018-06-01 ENCOUNTER — Encounter (HOSPITAL_COMMUNITY): Payer: Medicare Other

## 2018-06-01 NOTE — Telephone Encounter (Signed)
This was done by another office in error and has been rescheduled.

## 2018-06-02 ENCOUNTER — Other Ambulatory Visit (INDEPENDENT_AMBULATORY_CARE_PROVIDER_SITE_OTHER): Payer: Medicare Other

## 2018-06-02 ENCOUNTER — Ambulatory Visit (INDEPENDENT_AMBULATORY_CARE_PROVIDER_SITE_OTHER): Payer: Medicare Other | Admitting: Internal Medicine

## 2018-06-02 ENCOUNTER — Encounter: Payer: Self-pay | Admitting: Internal Medicine

## 2018-06-02 VITALS — BP 126/78 | HR 94 | Temp 98.1°F | Ht 60.0 in | Wt 250.0 lb

## 2018-06-02 DIAGNOSIS — R109 Unspecified abdominal pain: Secondary | ICD-10-CM | POA: Insufficient documentation

## 2018-06-02 DIAGNOSIS — R1030 Lower abdominal pain, unspecified: Secondary | ICD-10-CM | POA: Diagnosis not present

## 2018-06-02 DIAGNOSIS — F32A Depression, unspecified: Secondary | ICD-10-CM

## 2018-06-02 DIAGNOSIS — F329 Major depressive disorder, single episode, unspecified: Secondary | ICD-10-CM | POA: Diagnosis not present

## 2018-06-02 DIAGNOSIS — E785 Hyperlipidemia, unspecified: Secondary | ICD-10-CM | POA: Diagnosis not present

## 2018-06-02 DIAGNOSIS — I10 Essential (primary) hypertension: Secondary | ICD-10-CM

## 2018-06-02 DIAGNOSIS — N183 Chronic kidney disease, stage 3 unspecified: Secondary | ICD-10-CM

## 2018-06-02 LAB — URINALYSIS, ROUTINE W REFLEX MICROSCOPIC
Bilirubin Urine: NEGATIVE
Hgb urine dipstick: NEGATIVE
KETONES UR: NEGATIVE
NITRITE: NEGATIVE
PH: 6.5 (ref 5.0–8.0)
RBC / HPF: NONE SEEN (ref 0–?)
Specific Gravity, Urine: 1.01 (ref 1.000–1.030)
Total Protein, Urine: NEGATIVE
Urine Glucose: NEGATIVE
Urobilinogen, UA: 0.2 (ref 0.0–1.0)

## 2018-06-02 MED ORDER — VENLAFAXINE HCL ER 225 MG PO TB24: ORAL_TABLET | ORAL | 3 refills | Status: DC

## 2018-06-02 NOTE — Patient Instructions (Signed)
OK to increase the Effexor XR to 225 mg per day  Please continue all other medications as before, and refills have been done if requested.  Please have the pharmacy call with any other refills you may need.  Please continue your efforts at being more active, low cholesterol diet, and weight control.  You are otherwise up to date with prevention measures today.  Please keep your appointments with your specialists as you may have planned  Please go to the LAB in the Basement (turn left off the elevator) for the tests to be done today - just the urine testing today  You will be contacted by phone if any changes need to be made immediately.  Otherwise, you will receive a letter about your results with an explanation, but please check with MyChart first.  Please remember to sign up for MyChart if you have not done so, as this will be important to you in the future with finding out test results, communicating by private email, and scheduling acute appointments online when needed.  Please return in 6 months, or sooner if needed

## 2018-06-02 NOTE — Progress Notes (Signed)
Subjective:    Patient ID: Kathy [name], female    DOB: 05-13-1941, 77 y.o.   MRN: [id number]  HPI  Here to f/u; overall doing ok,  Pt denies chest pain, increasing sob or doe, wheezing, orthopnea, PND, increased LE swelling, palpitations, dizziness or syncope.  Pt denies new neurological symptoms such as new headache, or facial or extremity weakness or numbness.  Pt denies polydipsia, polyuria, or low sugar episode.  Pt states overall good compliance with meds, mostly trying to follow appropriate diet, with wt overall stable, remains on Home o2. Seen in ED with abd pain with ct neg for acute, but + UTI, finished antibx, but still remains mild ? Worsening low mid abd pain again though Denies urinary symptoms such as dysuria, frequency, urgency, flank pain, hematuria or n/v, fever, chills.  Also depression has increased x 2-3 wks, and after 20 yrs of effexor xr 150 mg daily.  No Si or HI.    Past Medical History:  Diagnosis Date  . Arthritis    fingers  . CKD (chronic kidney disease) stage 3, GFR 30-59 ml/min (HCC) 11/30/2017  . Depression   . Dizziness    in AM, getting out of bed  . Dysrhythmia    "skips a beat" sometimes - followed by PCP  . Endometrial ca ([place]) 11/30/2017   S/p surgury 1990's  . GERD (gastroesophageal reflux disease) 11/30/2017  . HLD (hyperlipidemia) 11/30/2017  . Hypercholesteremia   . Hypertension   . Neuropathy    bilateral feet  . Shortness of breath dyspnea   . Sleep apnea    has CPAP, doesn't use  . Umbilical hernia    Past Surgical History:  Procedure Laterality Date  . ABDOMINAL HYSTERECTOMY    . BROW LIFT Bilateral 07/15/2015   Procedure: BLEPHAROPLASTY;  Surgeon: Karle Starch, MD;  Location: [facility];  Service: Ophthalmology;  Laterality: Bilateral;  . CHOLECYSTECTOMY    . HAMMER TOE SURGERY    . HERNIA REPAIR    . KNEE ARTHROSCOPY Bilateral   . PTOSIS REPAIR Bilateral 07/15/2015   Procedure: PTOSIS REPAIR;  Surgeon: Karle Starch, MD;  Location:  [facility];  Service: Ophthalmology;  Laterality: Bilateral;  CPAP  . TONSILLECTOMY      reports that she quit smoking about 31 years ago. Her smoking use included cigarettes. She has a 29.00 pack-year smoking history. She has never used smokeless tobacco. She reports that she does not drink alcohol or use drugs. family history includes Congestive Heart Failure in her mother; Parkinson's disease in her father; Stroke in her father. Allergies  Allergen Reactions  . Lipitor [Atorvastatin] Other (See Comments)    Memory issues  . Requip [Ropinirole Hcl] Other (See Comments)    Pt reports feeling generally unwell on this medication   Current Outpatient Medications on File Prior to Visit  Medication Sig Dispense Refill  . albuterol (PROVENTIL HFA;VENTOLIN HFA) 108 (90 Base) MCG/ACT inhaler Inhale 1-2 puffs into the lungs every 6 (six) hours as needed for wheezing or shortness of breath. 1 Inhaler 2  . benzonatate (TESSALON PERLES) 100 MG capsule Take 1 capsule (100 mg total) by mouth 2 (two) times daily as needed for cough. 60 capsule 5  . fenofibrate micronized (LOFIBRA) 134 MG capsule Take 134 mg by mouth daily before breakfast.    . furosemide (LASIX) 40 MG tablet 1 tab by mouth in the AM, and 1 tab by mouth in the PM as needed for persistent swelling or  weight gain more than 3-5 lbs 60 tablet 11  . losartan (COZAAR) 100 MG tablet Take 50 mg by mouth daily.    . metoprolol succinate (TOPROL-XL) 50 MG 24 hr tablet Take 1 tablet (50 mg total) by mouth daily. 90 tablet 1  . predniSONE (DELTASONE) 5 MG tablet Take 1 tablet (5 mg total) by mouth daily with breakfast. 30 tablet 5   No current facility-administered medications on file prior to visit.    Review of Systems  Constitutional: Negative for other unusual diaphoresis or sweats HENT: Negative for ear discharge or swelling Eyes: Negative for other worsening visual disturbances Respiratory: Negative for stridor or other swelling    Gastrointestinal: Negative for worsening distension or other blood Genitourinary: Negative for retention or other urinary change Musculoskeletal: Negative for other MSK pain or swelling Skin: Negative for color change or other new lesions Neurological: Negative for worsening tremors and other numbness  Psychiatric/Behavioral: Negative for worsening agitation or other fatigue All other system neg per pt    Objective:   Physical Exam BP 126/78   Pulse 94   Temp 98.1 F (36.7 C) (Oral)   Ht 5' (1.524 m)   Wt 250 lb (113.4 kg)   SpO2 94%   BMI 48.82 kg/m   On home o2 VS noted,  Constitutional: Pt appears in NAD HENT: Head: NCAT.  Right Ear: External ear normal.  Left Ear: External ear normal.  Eyes: . Pupils are equal, round, and reactive to light. Conjunctivae and EOM are normal Nose: without d/c or deformity Neck: Neck supple. Gross normal ROM Cardiovascular: Normal rate and regular rhythm.   Pulmonary/Chest: Effort normal and breath sounds without rales or wheezing.  Abd:  Soft, ND, + BS, no organomegaly with mild low mid abd tender, no guarding or rebound Neurological: Pt is alert. At baseline orientation, motor grossly intact Skin: Skin is warm. No rashes, other new lesions, no LE edema Psychiatric: Pt behavior is normal without agitation , + depressed affect   Lab Results  Component Value Date   WBC 7.5 05/23/2018   HGB 13.6 05/23/2018   HCT 44.3 05/23/2018   PLT 217 05/23/2018   GLUCOSE 124 (H) 05/23/2018   CHOL 223 (H) 11/30/2017   TRIG 106.0 11/30/2017   HDL 74.40 11/30/2017   LDLCALC 127 (H) 11/30/2017   ALT 23 05/23/2018   AST 25 05/23/2018   NA 140 05/23/2018   K 3.5 05/23/2018   CL 100 05/23/2018   CREATININE 1.35 (H) 05/23/2018   BUN 24 (H) 05/23/2018   CO2 30 05/23/2018   TSH 1.58 11/30/2017       Assessment & Plan:

## 2018-06-03 ENCOUNTER — Encounter: Payer: Self-pay | Admitting: Internal Medicine

## 2018-06-03 LAB — URINE CULTURE
MICRO NUMBER: 91463789
SPECIMEN QUALITY: ADEQUATE

## 2018-06-03 NOTE — Assessment & Plan Note (Signed)
Exam benign, ok for repeat urine study per pt request

## 2018-06-03 NOTE — Assessment & Plan Note (Signed)
Mild worsening, denies SI or HI, for increased effexor xr to 225

## 2018-06-03 NOTE — Assessment & Plan Note (Addendum)
stable overall by history and exam, recent data reviewed with pt, and pt to continue medical treatment as before,  to f/u any worsening symptoms or concerns, to d/c lodine and avoid further nsaids or nephrotoxins

## 2018-06-03 NOTE — Assessment & Plan Note (Signed)
stable overall by history and exam, recent data reviewed with pt, and pt to continue medical treatment as before,  to f/u any worsening symptoms or concerns  

## 2018-06-03 NOTE — Assessment & Plan Note (Signed)
stable overall by history and exam, recent data reviewed with pt, and pt to continue medical treatment as before,  to f/u any worsening symptoms or concerns, for f/u lipids with labs 

## 2018-06-06 ENCOUNTER — Encounter (HOSPITAL_COMMUNITY): Payer: Medicare Other

## 2018-06-08 ENCOUNTER — Encounter (HOSPITAL_COMMUNITY): Payer: Medicare Other

## 2018-06-13 ENCOUNTER — Encounter (HOSPITAL_COMMUNITY): Payer: Medicare Other

## 2018-06-15 ENCOUNTER — Encounter (HOSPITAL_COMMUNITY): Payer: Medicare Other

## 2018-06-20 ENCOUNTER — Encounter (HOSPITAL_COMMUNITY): Payer: Medicare Other

## 2018-06-22 ENCOUNTER — Ambulatory Visit (HOSPITAL_COMMUNITY): Payer: Self-pay | Admitting: *Deleted

## 2018-06-22 ENCOUNTER — Encounter (HOSPITAL_COMMUNITY): Payer: Medicare Other

## 2018-06-22 NOTE — Progress Notes (Signed)
Discharge Progress Report  Patient Details  Name: Kathy [name] MRN: [id number] Date of Birth: March 27, 1941 Referring Provider:     Pulmonary Rehab Walk Test from 12/22/2017 in [place]  Referring Provider  Dr. Halford Chessman       Number of Visits: 23 Reason for Discharge:  Patient reached a stable level of exercise. Patient independent in their exercise.  Smoking History:  Social History   Tobacco Use  Smoking Status Former Smoker  . Packs/day: 1.00  . Years: 29.00  . Pack years: 29.00  . Types: Cigarettes  . Last attempt to quit: 06/28/1986  . Years since quitting: 32.0  Smokeless Tobacco Never Used  Tobacco Comment   quit 40+ yrs ago, 1 PPD for a few years    Diagnosis:  Interstitial lung disease ([place])  ADL [facility]: Pulmonary Assessment Scores    Row Name 12/26/17 0905 01/05/18 1543 04/05/18 1155     ADL [facility]   ADL Phase  Entry  -  Exit   SOB Score total  -  -  75     CAT Score   CAT Score  -  29  31     mMRC Score   mMRC Score  4  -  -   Row Name 04/13/18 0903 06/22/18 1455       ADL [facility]   ADL Phase  Exit  Entry    SOB Score total  -  80      mMRC Score   mMRC Score  4  -       Initial Exercise Prescription: Initial Exercise Prescription - 12/26/17 0900      Date of Initial Exercise RX and Referring Provider   Date  12/26/17    Referring Provider  Dr. Halford Chessman      Oxygen   Oxygen  Continuous    Liters  2      NuStep   Level  1    SPM  80    Minutes  17      Prescription Details   Frequency (times per week)  2    Duration  Progress to 45 minutes of aerobic exercise without signs/symptoms of physical distress      Intensity   THRR 40-80% of Max Heartrate  57-114    Ratings of Perceived Exertion  11-13    Perceived Dyspnea  0-4      Progression   Progression  Continue progressive overload as per policy without signs/symptoms or physical distress.      Resistance Training   Training Prescription  Yes    Weight   orange bands    Reps  10-15       Discharge Exercise Prescription (Final Exercise Prescription Changes): Exercise Prescription Changes - 03/28/18 1600      Response to Exercise   Blood Pressure (Admit)  132/64    Blood Pressure (Exercise)  126/62    Blood Pressure (Exit)  128/60    Heart Rate (Admit)  97 bpm    Heart Rate (Exercise)  87 bpm    Heart Rate (Exit)  79 bpm    Oxygen Saturation (Admit)  96 %    Oxygen Saturation (Exercise)  95 %    Oxygen Saturation (Exit)  96 %    Rating of Perceived Exertion (Exercise)  10    Perceived Dyspnea (Exercise)  2    Duration  Progress to 45 minutes of aerobic exercise without signs/symptoms of physical distress  Intensity  THRR unchanged      Progression   Progression  Continue to progress workloads to maintain intensity without signs/symptoms of physical distress.      Resistance Training   Training Prescription  Yes    Weight  orange bands    Reps  10-15    Time  10 Minutes      Oxygen   Oxygen  Continuous    Liters  2      NuStep   Level  2    SPM  80    Minutes  51    METs  1.3       Functional Capacity: 6 Minute Walk    Row Name 12/26/17 0905 04/13/18 0903       6 Minute Walk   Phase  Initial  Discharge    Distance  500 feet  657 feet    Distance Feet Change  -  157 ft    Walk Time  - 4 minutes and 9 seconds  6 minutes    # of Rest Breaks  - 1 minute 51 seconds in total  - 1 (1 minute 20 seconds)    MPH  0.94  1.24    METS  1.77  1.92    RPE  14  13    Perceived Dyspnea   3  3    Symptoms  Yes (comment)  Yes (comment)    Comments  7/10 lower back pain  fatigue    Resting HR  97 bpm  85 bpm    Resting BP  100/52  140/80    Resting Oxygen Saturation   96 %  97 %    Exercise Oxygen Saturation  during 6 min walk  91 %  92 %    Max Ex. HR  125 bpm  135 bpm    Max Ex. BP  138/48  164/80      Interval HR   1 Minute HR  118  110    2 Minute HR  125  130    3 Minute HR  122  132    4 Minute HR  119  135     5 Minute HR  125  135    6 Minute HR  122  130    2 Minute Post HR  104  -    Interval Heart Rate?  Yes  Yes      Interval Oxygen   Interval Oxygen?  Yes  Yes    Baseline Oxygen Saturation %  96 %  97 %    1 Minute Oxygen Saturation %  93 %  95 %    1 Minute Liters of Oxygen  2 L  2 L    2 Minute Oxygen Saturation %  93 %  94 %    2 Minute Liters of Oxygen  2 L  2 L    3 Minute Oxygen Saturation %  94 %  94 %    3 Minute Liters of Oxygen  2 L  2 L    4 Minute Oxygen Saturation %  92 %  94 %    4 Minute Liters of Oxygen  2 L  2 L    5 Minute Oxygen Saturation %  91 %  92 %    5 Minute Liters of Oxygen  2 L  2 L    6 Minute Oxygen Saturation %  91 %  92 %    6  Minute Liters of Oxygen  2 L  2 L    2 Minute Post Oxygen Saturation %  95 %  -    2 Minute Post Liters of Oxygen  2 L  -       Psychological, QOL, Others - Outcomes: PHQ 2/9: Depression screen [facility] 2/9 12/19/2017 11/30/2017 07/26/2016  Decreased Interest 0 0 0  Down, Depressed, Hopeless 1 1 0  PHQ - 2 Score 1 1 0    Quality of Life:   Personal Goals: Goals established at orientation with interventions provided to work toward goal.    Personal Goals Discharge: Goals and Risk Factor Review    Row Name 01/17/18 1033 02/15/18 1245 03/16/18 1708 06/22/18 1441       Core Components/Risk Factors/Patient Goals Review   Personal Goals Review  Weight Management/Obesity;Improve shortness of breath with ADL's;Increase knowledge of respiratory medications and ability to use respiratory devices properly.;Develop more efficient breathing techniques such as purse lipped breathing and diaphragmatic breathing and practicing self-pacing with activity.  Weight Management/Obesity;Improve shortness of breath with ADL's;Increase knowledge of respiratory medications and ability to use respiratory devices properly.;Develop more efficient breathing techniques such as purse lipped breathing and diaphragmatic breathing and practicing self-pacing  with activity.  Weight Management/Obesity;Improve shortness of breath with ADL's;Increase knowledge of respiratory medications and ability to use respiratory devices properly.;Develop more efficient breathing techniques such as purse lipped breathing and diaphragmatic breathing and practicing self-pacing with activity.  Weight Management/Obesity;Improve shortness of breath with ADL's;Increase knowledge of respiratory medications and ability to use respiratory devices properly.;Develop more efficient breathing techniques such as purse lipped breathing and diaphragmatic breathing and practicing self-pacing with activity.    Review  Pt has completed 3 exercise sessions.  Unable to review pt progress toward pt goals.  Hopeful that pt will show progress toward pt goals in the next 30 day review.  Pt has completed 10 exercise sessions. Due to pt chronic and multiple orthopedic challenges, pt is only able to to seated stepper for all three stations. Pt workloads maintain at level 1.  Pt not able to engage in any exercise or activity outside of rehab. Pt has lost 2.7 kg. Hopeful that pt will show measureable progress toward pt goals in the next 30 day review.  Pt has completed 17 exercise sessions. Due to pt chronic and multiple orthopedic challenges, pt is only able to to seated stepper for two stations. Pt workloads increased to level 2.  Pt can ambulate some around the track on days her legs feel "ok"  averages about 5 laps. Pt not able to engage in any consistent exercise or activity outside of rehab. Pt has lost .9kg. Hopeful that pt will show measureable progress toward pt goals in the next 30 day review.  Pt graduates with the completion of 23 exercise sessions.  Pt plans to continue exercise with the pulmonary maintenance program here at [facility]. Although pt experienced an increase in her CAT scores, this is believed to be reflectivie of a cold she had for 3 weeks prior to graduating.    Expected Outcomes  See  Admission Goals/Outcomes  See Admission Goals/Outcomes  See Admission Goals/Outcomes  Pt made progress toward achieveing goals and outcomes       Exercise Goals and Review:   Exercise Goals Re-Evaluation: Exercise Goals Re-Evaluation    Row Name 01/16/18 1231 02/14/18 0713 03/14/18 0909         Exercise Goal Re-Evaluation   Exercise Goals Review  Increase Physical Activity;Able  to understand and use rate of perceived exertion (RPE) scale;Knowledge and understanding of Target Heart Rate Range (THRR);Understanding of Exercise Prescription;Increase Strength and Stamina;Able to understand and use Dyspnea scale  Increase Physical Activity;Able to understand and use rate of perceived exertion (RPE) scale;Knowledge and understanding of Target Heart Rate Range (THRR);Understanding of Exercise Prescription;Increase Strength and Stamina;Able to understand and use Dyspnea scale  Increase Physical Activity;Able to understand and use rate of perceived exertion (RPE) scale;Knowledge and understanding of Target Heart Rate Range (THRR);Understanding of Exercise Prescription;Increase Strength and Stamina;Able to understand and use Dyspnea scale     Comments  Patient has only attended three rehab sessions. Will cont. to monitor and progress as able.   Patient is making slow and steady progress. MET average places her in a low level. Mostly non-weight exercise. Will cont to progress and motivate.  Patient's progress is slow. MET average places her in a low level 1.4. Has to be encouraged to walk. Can complete 5 laps (200 ft each) in 15 minutes. Will encourage home exercise.      Expected Outcomes  Through rehab and exercising at home, patient will find it easier to carry out ADL's and will gain the confidence to establish an exercise routine at home.   Through rehab and exercising at home, patient will find it easier to carry out ADL's and will gain the confidence to establish an exercise routine at home.   Through  rehab and exercising at home, patient will find it easier to carry out ADL's and will gain the confidence to establish an exercise routine at home.         Nutrition & Weight - Outcomes:    Nutrition: Nutrition Therapy & Goals - 04/21/18 1138      Nutrition Therapy   Protein (specify units)  general, healthful      Personal Nutrition Goals   Nutrition Goal  Identify food quantities necessary to achieve wt loss of  -2# per week to a goal wt loss of 6-24 lb at graduation from pulmonary rehab.    Personal Goal #2  Pt to identify and limit food sources of sodium.      Intervention Plan   Intervention  Prescribe, educate and counsel regarding individualized specific dietary modifications aiming towards targeted core components such as weight, hypertension, lipid management, diabetes, heart failure and other comorbidities.    Expected Outcomes  Short Term Goal: Understand basic principles of dietary content, such as calories, fat, sodium, cholesterol and nutrients.       Nutrition Discharge: Nutrition Assessments - 04/21/18 1139      Rate Your Plate Scores   Pre Score  55    Post Score  38       Education Questionnaire Score: Knowledge Questionnaire Score - 06/22/18 1455      Knowledge Questionnaire Score   Pre Score  15/18    Post Score  17/18       Goals reviewed with patient. Cherre Huger, BSN Cardiac and Training and development officer

## 2018-06-22 NOTE — Addendum Note (Signed)
Encounter addended by: Rowe Pavy, RN on: 06/22/2018 4:13 PM  Actions taken: Episode resolved

## 2018-06-22 NOTE — Addendum Note (Signed)
Encounter addended by: Rowe Pavy, RN on: 06/22/2018 3:02 PM  Actions taken: Flowsheet data copied forward, Clinical Note Signed, Visit Navigator Flowsheet section accepted

## 2018-06-22 NOTE — Addendum Note (Signed)
Encounter addended by: Rowe Pavy, RN on: 06/22/2018 3:17 PM  Actions taken: Clinical Note Signed

## 2018-06-22 NOTE — Progress Notes (Cosign Needed)
Discharge Progress Report  Patient Details  Name: Kathy [name] MRN: [id number] Date of Birth: January 15, 1941 Referring Provider:     Pulmonary Rehab Walk Test from 12/22/2017 in [place]  Referring Provider  Dr. Halford Chessman     Number of Visits: 23  Reason for Discharge:  Patient reached a stable level of exercise. Patient independent in their exercise. Patient has met program and personal goals.  Smoking History:  Social History   Tobacco Use  Smoking Status Former Smoker  . Packs/day: 1.00  . Years: 29.00  . Pack years: 29.00  . Types: Cigarettes  . Last attempt to quit: 06/28/1986  . Years since quitting: 32.0  Smokeless Tobacco Never Used  Tobacco Comment   quit 40+ yrs ago, 1 PPD for a few years    Diagnosis:  Interstitial lung disease ([place])  ADL [facility]: Pulmonary Assessment Scores    Row Name 12/26/17 0905 01/05/18 1543 04/05/18 1155     ADL [facility]   ADL Phase  Entry  -  Exit   SOB Score total  -  -  75     CAT Score   CAT Score  -  29  31     mMRC Score   mMRC Score  4  -  -   Row Name 04/13/18 0903 06/22/18 1455       ADL [facility]   ADL Phase  Exit  Entry    SOB Score total  -  80 late entry reflects pre score - 01/05/18      mMRC Score   mMRC Score  4  -       Initial Exercise Prescription: Initial Exercise Prescription - 12/26/17 0900      Date of Initial Exercise RX and Referring Provider   Date  12/26/17    Referring Provider  Dr. Halford Chessman      Oxygen   Oxygen  Continuous    Liters  2      NuStep   Level  1    SPM  80    Minutes  17      Prescription Details   Frequency (times per week)  2    Duration  Progress to 45 minutes of aerobic exercise without signs/symptoms of physical distress      Intensity   THRR 40-80% of Max Heartrate  57-114    Ratings of Perceived Exertion  11-13    Perceived Dyspnea  0-4      Progression   Progression  Continue progressive overload as per policy without signs/symptoms or physical  distress.      Resistance Training   Training Prescription  Yes    Weight  orange bands    Reps  10-15       Discharge Exercise Prescription (Final Exercise Prescription Changes): Exercise Prescription Changes - 03/28/18 1600      Response to Exercise   Blood Pressure (Admit)  132/64    Blood Pressure (Exercise)  126/62    Blood Pressure (Exit)  128/60    Heart Rate (Admit)  97 bpm    Heart Rate (Exercise)  87 bpm    Heart Rate (Exit)  79 bpm    Oxygen Saturation (Admit)  96 %    Oxygen Saturation (Exercise)  95 %    Oxygen Saturation (Exit)  96 %    Rating of Perceived Exertion (Exercise)  10    Perceived Dyspnea (Exercise)  2    Duration  Progress to  45 minutes of aerobic exercise without signs/symptoms of physical distress    Intensity  THRR unchanged      Progression   Progression  Continue to progress workloads to maintain intensity without signs/symptoms of physical distress.      Resistance Training   Training Prescription  Yes    Weight  orange bands    Reps  10-15    Time  10 Minutes      Oxygen   Oxygen  Continuous    Liters  2      NuStep   Level  2    SPM  80    Minutes  51    METs  1.3       Functional Capacity: 6 Minute Walk    Row Name 12/26/17 0905 04/13/18 0903       6 Minute Walk   Phase  Initial  Discharge    Distance  500 feet  657 feet    Distance Feet Change  -  157 ft    Walk Time  - 4 minutes and 9 seconds  6 minutes    # of Rest Breaks  - 1 minute 51 seconds in total  - 1 (1 minute 20 seconds)    MPH  0.94  1.24    METS  1.77  1.92    RPE  14  13    Perceived Dyspnea   3  3    Symptoms  Yes (comment)  Yes (comment)    Comments  7/10 lower back pain  fatigue    Resting HR  97 bpm  85 bpm    Resting BP  100/52  140/80    Resting Oxygen Saturation   96 %  97 %    Exercise Oxygen Saturation  during 6 min walk  91 %  92 %    Max Ex. HR  125 bpm  135 bpm    Max Ex. BP  138/48  164/80      Interval HR   1 Minute HR  118  110     2 Minute HR  125  130    3 Minute HR  122  132    4 Minute HR  119  135    5 Minute HR  125  135    6 Minute HR  122  130    2 Minute Post HR  104  -    Interval Heart Rate?  Yes  Yes      Interval Oxygen   Interval Oxygen?  Yes  Yes    Baseline Oxygen Saturation %  96 %  97 %    1 Minute Oxygen Saturation %  93 %  95 %    1 Minute Liters of Oxygen  2 L  2 L    2 Minute Oxygen Saturation %  93 %  94 %    2 Minute Liters of Oxygen  2 L  2 L    3 Minute Oxygen Saturation %  94 %  94 %    3 Minute Liters of Oxygen  2 L  2 L    4 Minute Oxygen Saturation %  92 %  94 %    4 Minute Liters of Oxygen  2 L  2 L    5 Minute Oxygen Saturation %  91 %  92 %    5 Minute Liters of Oxygen  2 L  2 L    6 Minute  Oxygen Saturation %  91 %  92 %    6 Minute Liters of Oxygen  2 L  2 L    2 Minute Post Oxygen Saturation %  95 %  -    2 Minute Post Liters of Oxygen  2 L  -       Psychological, QOL, Others - Outcomes: PHQ 2/9: Depression screen [facility] 2/9 12/19/2017 11/30/2017 07/26/2016  Decreased Interest 0 0 0  Down, Depressed, Hopeless 1 1 0  PHQ - 2 Score 1 1 0    Quality of Life:   Personal Goals: Goals established at orientation with interventions provided to work toward goal.    Personal Goals Discharge: Goals and Risk Factor Review    Row Name 01/17/18 1033 02/15/18 1245 03/16/18 1708 06/22/18 1441       Core Components/Risk Factors/Patient Goals Review   Personal Goals Review  Weight Management/Obesity;Improve shortness of breath with ADL's;Increase knowledge of respiratory medications and ability to use respiratory devices properly.;Develop more efficient breathing techniques such as purse lipped breathing and diaphragmatic breathing and practicing self-pacing with activity.  Weight Management/Obesity;Improve shortness of breath with ADL's;Increase knowledge of respiratory medications and ability to use respiratory devices properly.;Develop more efficient breathing techniques such as  purse lipped breathing and diaphragmatic breathing and practicing self-pacing with activity.  Weight Management/Obesity;Improve shortness of breath with ADL's;Increase knowledge of respiratory medications and ability to use respiratory devices properly.;Develop more efficient breathing techniques such as purse lipped breathing and diaphragmatic breathing and practicing self-pacing with activity.  Weight Management/Obesity;Improve shortness of breath with ADL's;Increase knowledge of respiratory medications and ability to use respiratory devices properly.;Develop more efficient breathing techniques such as purse lipped breathing and diaphragmatic breathing and practicing self-pacing with activity.    Review  Pt has completed 3 exercise sessions.  Unable to review pt progress toward pt goals.  Hopeful that pt will show progress toward pt goals in the next 30 day review.  Pt has completed 10 exercise sessions. Due to pt chronic and multiple orthopedic challenges, pt is only able to to seated stepper for all three stations. Pt workloads maintain at level 1.  Pt not able to engage in any exercise or activity outside of rehab. Pt has lost 2.7 kg. Hopeful that pt will show measureable progress toward pt goals in the next 30 day review.  Pt has completed 17 exercise sessions. Due to pt chronic and multiple orthopedic challenges, pt is only able to to seated stepper for two stations. Pt workloads increased to level 2.  Pt can ambulate some around the track on days her legs feel "ok"  averages about 5 laps. Pt not able to engage in any consistent exercise or activity outside of rehab. Pt has lost .9kg. Hopeful that pt will show measureable progress toward pt goals in the next 30 day review.  Pt graduates with the completion of 23 exercise sessions.  Pt plans to continue exercise with the pulmonary maintenance program here at [facility]. Although pt experienced an increase in her CAT scores, this is believed to be reflectivie of a  cold she had for 3 weeks prior to graduating.    Expected Outcomes  See Admission Goals/Outcomes  See Admission Goals/Outcomes  See Admission Goals/Outcomes  Pt made progress toward achieveing goals and outcomes       Exercise Goals and Review:   Exercise Goals Re-Evaluation: Exercise Goals Re-Evaluation    Row Name 01/16/18 1231 02/14/18 3976 03/14/18 7341  Exercise Goal Re-Evaluation   Exercise Goals Review  Increase Physical Activity;Able to understand and use rate of perceived exertion (RPE) scale;Knowledge and understanding of Target Heart Rate Range (THRR);Understanding of Exercise Prescription;Increase Strength and Stamina;Able to understand and use Dyspnea scale  Increase Physical Activity;Able to understand and use rate of perceived exertion (RPE) scale;Knowledge and understanding of Target Heart Rate Range (THRR);Understanding of Exercise Prescription;Increase Strength and Stamina;Able to understand and use Dyspnea scale  Increase Physical Activity;Able to understand and use rate of perceived exertion (RPE) scale;Knowledge and understanding of Target Heart Rate Range (THRR);Understanding of Exercise Prescription;Increase Strength and Stamina;Able to understand and use Dyspnea scale     Comments  Patient has only attended three rehab sessions. Will cont. to monitor and progress as able.   Patient is making slow and steady progress. MET average places her in a low level. Mostly non-weight exercise. Will cont to progress and motivate.  Patient's progress is slow. MET average places her in a low level 1.4. Has to be encouraged to walk. Can complete 5 laps (200 ft each) in 15 minutes. Will encourage home exercise.      Expected Outcomes  Through rehab and exercising at home, patient will find it easier to carry out ADL's and will gain the confidence to establish an exercise routine at home.   Through rehab and exercising at home, patient will find it easier to carry out ADL's and will  gain the confidence to establish an exercise routine at home.   Through rehab and exercising at home, patient will find it easier to carry out ADL's and will gain the confidence to establish an exercise routine at home.         Nutrition & Weight - Outcomes:    Nutrition: Nutrition Therapy & Goals - 04/21/18 1138      Nutrition Therapy   Protein (specify units)  general, healthful      Personal Nutrition Goals   Nutrition Goal  Identify food quantities necessary to achieve wt loss of  -2# per week to a goal wt loss of 6-24 lb at graduation from pulmonary rehab.    Personal Goal #2  Pt to identify and limit food sources of sodium.      Intervention Plan   Intervention  Prescribe, educate and counsel regarding individualized specific dietary modifications aiming towards targeted core components such as weight, hypertension, lipid management, diabetes, heart failure and other comorbidities.    Expected Outcomes  Short Term Goal: Understand basic principles of dietary content, such as calories, fat, sodium, cholesterol and nutrients.       Nutrition Discharge: Nutrition Assessments - 04/21/18 1139      Rate Your Plate Scores   Pre Score  55    Post Score  38       Education Questionnaire Score: Knowledge Questionnaire Score - 06/22/18 1455      Knowledge Questionnaire Score   Pre Score  15/18    Post Score  17/18       Goals reviewed with patient. Cherre Huger, BSN Cardiac and Training and development officer

## 2018-06-27 ENCOUNTER — Encounter (HOSPITAL_COMMUNITY): Payer: Medicare Other

## 2018-06-29 ENCOUNTER — Encounter (HOSPITAL_COMMUNITY): Payer: Self-pay

## 2018-07-04 ENCOUNTER — Encounter (HOSPITAL_COMMUNITY): Payer: Self-pay

## 2018-07-06 ENCOUNTER — Encounter (HOSPITAL_COMMUNITY): Payer: Self-pay

## 2018-07-11 ENCOUNTER — Encounter (HOSPITAL_COMMUNITY): Payer: Self-pay

## 2018-07-12 ENCOUNTER — Ambulatory Visit: Payer: Self-pay

## 2018-07-12 NOTE — Telephone Encounter (Signed)
Call returned to patient who states she is having a rash from her neck down. It was first noticed on Monday.  She has been treating with benadryl for the itch.  She expressed concern with taking her regular medications. She was advised to ask her pharmacist to review all her medications and possible contraindication with benadryl.  Pt denies having taken any new medications, using new soaps or creams that would cause rash. Pt states rash is everywhere but on her face and she describes it as irregular raised  bumps.  She states the itching is a 7 on the itch scale.  It is hard to sleep. She states that she has had a rash in the past that was like this but by the time she was seen it was gone. Appointment scheduled per protocol. Care advice read to patient. Pt verbalized understanding of all instructions.  Reason for Disposition . SEVERE itching (i.e., interferes with sleep, normal activities or school)  Answer Assessment - Initial Assessment Questions 1.) CALLER DIAGNOSIS: "What do you think is causing the rash?" (e.g., Chickenpox, Hives, Impetigo, Athlete's Foot, etc.) unsure 2.) LOCATION:  "Is it widespread or localized?" Neck down 3.) NEW MEDICATIONS: "Are you taking any new medicine?"no  Answer Assessment - Initial Assessment Questions 1. APPEARANCE of RASH: "Describe the rash." (e.g., spots, blisters, raised areas, skin peeling, scaly)     raised 2. SIZE: "How big are the spots?" (e.g., tip of pen, eraser, coin; inches, centimeters)     All different 3. LOCATION: "Where is the rash located?"     Neck down 4. COLOR: "What color is the rash?" (Note: It is difficult to assess rash color in people with darker-colored skin. When this situation occurs, simply ask the caller to describe what they see.)     red 5. ONSET: "When did the rash begin?"     Monday 6. FEVER: "Do you have a fever?" If so, ask: "What is your temperature, how was it measured, and when did it start?"     no 7. ITCHING: "Does  the rash itch?" If so, ask: "How bad is the itch?" (Scale 1-10; or mild, moderate, severe)     Itching 7 8. CAUSE: "What do you think is causing the rash?"     no 9. MEDICATION FACTORS: "Have you started any new medications within the last 2 weeks?" (e.g., antibiotics)      no 10. OTHER SYMPTOMS: "Do you have any other symptoms?" (e.g., dizziness, headache, sore throat, joint pain)       no 11. PREGNANCY: "Is there any chance you are pregnant?" "When was your last menstrual period?"       N/A  Protocols used: RASH OR REDNESS - [place], [place]

## 2018-07-13 ENCOUNTER — Encounter: Payer: Self-pay | Admitting: Nurse Practitioner

## 2018-07-13 ENCOUNTER — Encounter (HOSPITAL_COMMUNITY): Payer: Self-pay

## 2018-07-13 ENCOUNTER — Ambulatory Visit (INDEPENDENT_AMBULATORY_CARE_PROVIDER_SITE_OTHER): Payer: Medicare Other | Admitting: Nurse Practitioner

## 2018-07-13 VITALS — BP 100/60 | HR 87 | Temp 97.6°F | Ht 60.0 in | Wt 267.0 lb

## 2018-07-13 DIAGNOSIS — R21 Rash and other nonspecific skin eruption: Secondary | ICD-10-CM

## 2018-07-13 MED ORDER — TRIAMCINOLONE ACETONIDE 0.5 % EX CREA: 1.0000 "application " | TOPICAL_CREAM | Freq: Two times a day (BID) | CUTANEOUS | 0 refills | Status: DC

## 2018-07-13 NOTE — Patient Instructions (Addendum)
Use steroid cream twice daily to affected areas, do not apply to face  Please return for a follow up in 1 week if rash is not better.  Rash, Adult  A rash is a change in the color of your skin. A rash can also change the way your skin feels. There are many different conditions and factors that can cause a rash. Follow these instructions at home: The goal of treatment is to stop the itching and keep the rash from spreading. Watch for any changes in your symptoms. Let your doctor know about them. Follow these instructions to help with your condition: Medicine Take or apply over-the-counter and prescription medicines only as told by your doctor. These may include medicines:  To treat red or swollen skin (corticosteroid creams).  To treat itching.  To treat an allergy (oral antihistamines).  To treat very bad symptoms (oral corticosteroids).  Skin care  Put cool cloths (compresses) on the affected areas.  Do not scratch or rub your skin.  Avoid covering the rash. Make sure that the rash is exposed to air as much as possible. Managing itching and discomfort  Avoid hot showers or baths. These can make itching worse. A cold shower may help.  Try taking a bath with: ? Epsom salts. You can get these at your local pharmacy or grocery store. Follow the instructions on the package. ? Baking soda. Pour a small amount into the bath as told by your doctor. ? Colloidal oatmeal. You can get this at your local pharmacy or grocery store. Follow the instructions on the package.  Try putting baking soda paste onto your skin. Stir water into baking soda until it gets like a paste.  Try putting on a lotion that relieves itchiness (calamine lotion).  Keep cool and out of the sun. Sweating and being hot can make itching worse. General instructions   Rest as needed.  Drink enough fluid to keep your pee (urine) pale yellow.  Wear loose-fitting clothing.  Avoid scented soaps, detergents, and  perfumes. Use gentle soaps, detergents, perfumes, and other cosmetic products.  Avoid anything that causes your rash. Keep a journal to help track what causes your rash. Write down: ? What you eat. ? What cosmetic products you use. ? What you drink. ? What you wear. This includes jewelry.  Keep all follow-up visits as told by your doctor. This is important. Contact a doctor if:  You sweat at night.  You lose weight.  You pee (urinate) more than normal.  You pee less than normal, or you notice that your pee is a darker color than normal.  You feel weak.  You throw up (vomit).  Your skin or the whites of your eyes look yellow (jaundice).  Your skin: ? Tingles. ? Is numb.  Your rash: ? Does not go away after a few days. ? Gets worse.  You are: ? More thirsty than normal. ? More tired than normal.  You have: ? New symptoms. ? Pain in your belly (abdomen). ? A fever. ? Watery poop (diarrhea). Get help right away if:  You have a fever and your symptoms suddenly get worse.  You start to feel mixed up (confused).  You have a very bad headache or a stiff neck.  You have very bad joint pains or stiffness.  You have jerky movements that you cannot control (seizure).  Your rash covers all or most of your body. The rash may or may not be painful.  You have blisters  that: ? Are on top of the rash. ? Grow larger. ? Grow together. ? Are painful. ? Are inside your nose or mouth.  You have a rash that: ? Looks like purple pinprick-sized spots all over your body. ? Has a "bull's eye" or looks like a target. ? Is red and painful, causes your skin to peel, and is not from being in the sun too long. Summary  A rash is a change in the color of your skin. A rash can also change the way your skin feels.  The goal of treatment is to stop the itching and keep the rash from spreading.  Take or apply over-the-counter and prescription medicines only as told by your  doctor.  Contact a doctor if you have new symptoms or symptoms that get worse.  Keep all follow-up visits as told by your doctor. This is important. This information is not intended to replace advice given to you by your health care provider. Make sure you discuss any questions you have with your health care provider. Document Released: 12/01/2007 Document Revised: 01/16/2018 Document Reviewed: 01/16/2018 Elsevier Interactive Patient Education  2019 [employer].

## 2018-07-13 NOTE — Progress Notes (Signed)
Kathy [name] is a 78 y.o. female with the following history as recorded in EpicCare:  Patient Active Problem List   Diagnosis Date Noted  . Abdominal pain 06/02/2018  . Rash 04/17/2018  . Acute bronchitis 04/03/2018  . Acute on chronic kidney failure ([place]) 12/12/2017  . Diastolic dysfunction 99/83/3825  . Endometrial ca ([place]) 11/30/2017  . GERD (gastroesophageal reflux disease) 11/30/2017  . HLD (hyperlipidemia) 11/30/2017  . CKD (chronic kidney disease) stage 3, GFR 30-59 ml/min (HCC) 11/30/2017  . Dyspnea on exertion 11/07/2017  . Cough 11/07/2017  . ILD (interstitial lung disease) ([place]) 03/15/2017  . Chronic respiratory failure with hypoxia ([place]) 03/15/2017  . Oral herpes simplex infection   . Influenza with pneumonia 06/28/2016  . Community acquired pneumonia 06/27/2016  . Hypertension 06/27/2016  . Depression 06/27/2016  . Sleep apnea 06/27/2016    Current Outpatient Medications  Medication Sig Dispense Refill  . albuterol (PROVENTIL HFA;VENTOLIN HFA) 108 (90 Base) MCG/ACT inhaler Inhale 1-2 puffs into the lungs every 6 (six) hours as needed for wheezing or shortness of breath. 1 Inhaler 2  . benzonatate (TESSALON PERLES) 100 MG capsule Take 1 capsule (100 mg total) by mouth 2 (two) times daily as needed for cough. 60 capsule 5  . furosemide (LASIX) 40 MG tablet 1 tab by mouth in the AM, and 1 tab by mouth in the PM as needed for persistent swelling or weight gain more than 3-5 lbs 60 tablet 11  . losartan (COZAAR) 100 MG tablet Take 50 mg by mouth daily.    . metoprolol succinate (TOPROL-XL) 50 MG 24 hr tablet Take 1 tablet (50 mg total) by mouth daily. 90 tablet 1  . predniSONE (DELTASONE) 5 MG tablet Take 1 tablet (5 mg total) by mouth daily with breakfast. 30 tablet 5  . Venlafaxine HCl 225 MG TB24 1 tab by mouth daily 90 each 3  . fenofibrate micronized (LOFIBRA) 134 MG capsule Take 134 mg by mouth daily before breakfast.    . triamcinolone cream (KENALOG) 0.5 % Apply 1  application topically 2 (two) times daily. 30 g 0   No current facility-administered medications for this visit.     Allergies: Lipitor [atorvastatin] and Requip [ropinirole hcl]  Past Medical History:  Diagnosis Date  . Arthritis    fingers  . CKD (chronic kidney disease) stage 3, GFR 30-59 ml/min ([facility]CC) 11/30/2017  . Depression   . Dizziness    in AM, getting out of bed  . Dysrhythmia    "skips a beat" sometimes - followed by PCP  . Endometrial ca ([place]) 11/30/2017   S/p surgury 1990's  . GERD (gastroesophageal reflux disease) 11/30/2017  . HLD (hyperlipidemia) 11/30/2017  . Hypercholesteremia   . Hypertension   . Neuropathy    bilateral feet  . Shortness of breath dyspnea   . Sleep apnea    has CPAP, doesn't use  . Umbilical hernia     Past Surgical History:  Procedure Laterality Date  . ABDOMINAL HYSTERECTOMY    . BROW LIFT Bilateral 07/15/2015   Procedure: BLEPHAROPLASTY;  Surgeon: Karle Starch, MD;  Location: [facility];  Service: Ophthalmology;  Laterality: Bilateral;  . CHOLECYSTECTOMY    . HAMMER TOE SURGERY    . HERNIA REPAIR    . KNEE ARTHROSCOPY Bilateral   . PTOSIS REPAIR Bilateral 07/15/2015   Procedure: PTOSIS REPAIR;  Surgeon: Karle Starch, MD;  Location: [facility];  Service: Ophthalmology;  Laterality: Bilateral;  CPAP  . TONSILLECTOMY  Family History  Problem Relation Age of Onset  . Congestive Heart Failure Mother   . Stroke Father   . Parkinson's disease Father     Social History   Tobacco Use  . Smoking status: Former Smoker    Packs/day: 1.00    Years: 29.00    Pack years: 29.00    Types: Cigarettes    Last attempt to quit: 06/28/1986    Years since quitting: 32.0  . Smokeless tobacco: Never Used  . Tobacco comment: quit 40+ yrs ago, 1 PPD for a few years  Substance Use Topics  . Alcohol use: No     Subjective:  Kathy [name] is here today requesting evaluation of acute complaint of rash, which began about 4 days ago,  woke  with the rash from neck to feet and it has been "coming and going" since. She describes as red raised itchy rash, currently the rash is only located to right upper forearm. Took benadryl which seemed to help some. She had similar rash 4-5 months ago which resolved without treatment She Denies recent use of new products, medications, foods, detergents. No bugs in house, no pets in house, no one in house with same rash. She Denies fevers, chills, oral swelling, trouble swallowing or breathing, cp, abdominal pain, nausea, vomiting.  ROS- See HPI  Objective:  Vitals:   07/13/18 1416  BP: 100/60  Pulse: 87  Temp: 97.6 F (36.4 C)  TempSrc: Oral  SpO2: 95%  Weight: 267 lb (121.1 kg)  Height: 5' (1.524 m)    General: Well developed, well nourished, in no acute distress  Skin : Warm and dry. Erythematous rash to right upper forearm. Head: Normocephalic and atraumatic  Eyes: Sclera and conjunctiva clear; pupils round and reactive to light; extraocular movements intact  Oropharynx: Pink, supple. No suspicious lesions  Neck: Supple Lungs: Effort unlabored, no respiratory distress.  CVS exam: normal rate and regular rhythm.  Vessels: Symmetric bilaterally  Neurologic: Alert and oriented; speech intact; face symmetrical; moves all extremities well; CNII-XII intact without focal deficit  Psychiatric: Normal mood and affect.   Assessment:  1. Rash     Plan:   ?allergic reaction, contact dermatitis Will trial topical steroid course to treat- triamcinolone sent- medication dosing, side effects discussed Home management, allergy journal, red flags and return precautions including when to seek immediate care discussed and printed on AVS She will f/u in 1 week if no improvement with triamcinolone, sooner for new, worsening symptoms  No follow-ups on file.  No orders of the defined types were placed in this encounter.   Requested Prescriptions   Signed Prescriptions Disp Refills  .  triamcinolone cream (KENALOG) 0.5 % 30 g 0    Sig: Apply 1 application topically 2 (two) times daily.

## 2018-07-18 ENCOUNTER — Encounter (HOSPITAL_COMMUNITY): Payer: Self-pay

## 2018-07-20 ENCOUNTER — Encounter (HOSPITAL_COMMUNITY): Payer: Self-pay

## 2018-07-25 ENCOUNTER — Encounter (HOSPITAL_COMMUNITY): Payer: Self-pay

## 2018-07-26 ENCOUNTER — Other Ambulatory Visit: Payer: Self-pay | Admitting: Internal Medicine

## 2018-07-27 ENCOUNTER — Encounter (HOSPITAL_COMMUNITY): Payer: Self-pay

## 2018-08-01 ENCOUNTER — Encounter (HOSPITAL_COMMUNITY): Payer: Self-pay

## 2018-08-03 ENCOUNTER — Encounter (HOSPITAL_COMMUNITY): Payer: Self-pay

## 2018-08-08 ENCOUNTER — Encounter (HOSPITAL_COMMUNITY): Payer: Self-pay

## 2018-08-09 ENCOUNTER — Encounter: Payer: Self-pay | Admitting: Pulmonary Disease

## 2018-08-09 ENCOUNTER — Ambulatory Visit (INDEPENDENT_AMBULATORY_CARE_PROVIDER_SITE_OTHER): Payer: Medicare Other | Admitting: Pulmonary Disease

## 2018-08-09 VITALS — BP 136/84 | HR 67 | Ht 60.0 in | Wt 258.0 lb

## 2018-08-09 DIAGNOSIS — Z6841 Body Mass Index (BMI) 40.0 and over, adult: Secondary | ICD-10-CM | POA: Diagnosis not present

## 2018-08-09 DIAGNOSIS — J849 Interstitial pulmonary disease, unspecified: Secondary | ICD-10-CM

## 2018-08-09 DIAGNOSIS — J479 Bronchiectasis, uncomplicated: Secondary | ICD-10-CM | POA: Diagnosis not present

## 2018-08-09 DIAGNOSIS — J9611 Chronic respiratory failure with hypoxia: Secondary | ICD-10-CM

## 2018-08-09 NOTE — Progress Notes (Signed)
[facility]  Chief Complaint  Patient presents with  . Follow-up    breathing no different from baseline; usual cough; states she does feel better today    Constitutional:  BP 136/84 (BP Location: Right Wrist, Cuff Size: Normal)   Pulse 67   Ht 5' (1.524 m)   Wt 258 lb (117 kg)   SpO2 96%   BMI 50.39 kg/m   Past Medical History:  CKD 3, Depression, Palpitations, Endometrial cancer, GERD, HLD, HTN, Peripheral neuropathy  Brief Summary:  Kathy [name] is a 78 y.o. 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  She is currently on 5 mg prednisone.  No change in breathing with dose reduction.  Has cough with clear to tan sputum.  Phlegm comes up easily.  Not having leg swelling as much.  Uses 2 liters oxygen.  Not having fever, chest pain, wheeze, or hemoptysis.  Denies reflux or dysphagia.  Has noticed more knee pain recently.   Physical Exam:   Appearance - well kempt, wearing oxygen  ENMT - no sinus tenderness, no nasal discharge, no oral exudate  Neck - no masses, trachea midline, no thyromegaly, no elevation in JVP  Respiratory - normal appearance of chest wall, normal respiratory effort w/o accessory muscle use, no dullness on percussion, no wheezing or rales  CV - s1s2 regular rate and rhythm, no murmurs, 1+ ankle peripheral edema, radial pulses symmetric  GI - soft, non tender  Lymph - no adenopathy noted in neck and axillary areas  MSK - normal gait  Ext - no cyanosis, clubbing, or joint inflammation noted  Skin - no rashes, lesions, or ulcers  Neuro - normal strength, oriented x 3  Psych - normal mood and affect   Assessment/Plan:   ILD with NSIP pattern. 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 - 2 liters oxygen  24/7   Patient Instructions  Prednisone 5 mg on even numbered days, and 2.5 mg on odd numbered days for 2 weeks.  Then 2.5 mg daily for 2 weeks.  Then stop prednisone.  Follow up in 2 months    Chesley Mires, MD [facility] Pager: [phone] 08/09/2018, 1:56 PM  Flow Sheet     Pulmonary tests:  Serology 03/01/17 >> ANA negative, RF < 14, CCP < 16, ANCA negative, SSA/SSB < 0.2 PFT 03/15/17 >> FEV1 1.40 (82%), FEV1% 96, TLC 2.86 (64%), DLCO 70% HP panel 03/15/17 >> negative Aspergillus IgE Panel 07/20/17 >> negative IgE 07/20/17 >> 44 PFT 03/14/18 >> FEV1 1.28 (76%), FEV1% 91, DLCO 59%  Chest imaging:  HRCT chest 02/22/17 >> atherosclerosis, 3 mm LUL nodule, patchy air trapping b/l, patchy reticulation and GGO b/l, minimal traction BTX HRCT chest 01/12/18 >> 3 mm nodule Lt apex, patchy air trapping, patchy subpleural reticulation and GGO, mild traction BTX  Cardiac tests:  Echo 03/21/17 >> EF 65 to 70%, grade 1 DD  Medications:   Allergies as of 08/09/2018      Reactions   Lipitor [atorvastatin] Other (See Comments)   Memory issues   Requip [ropinirole Hcl] Other (See Comments)   Pt reports feeling generally unwell on this medication      Medication List       Accurate as of August 09, 2018  1:56 PM. Always use your most  recent med list.        albuterol 108 (90 Base) MCG/ACT inhaler Commonly known as:  PROVENTIL HFA;VENTOLIN HFA Inhale 1-2 puffs into the lungs every 6 (six) hours as needed for wheezing or shortness of breath.   benzonatate 100 MG capsule Commonly known as:  TESSALON PERLES Take 1 capsule (100 mg total) by mouth 2 (two) times daily as needed for cough.   fenofibrate micronized 134 MG capsule Commonly known as:  LOFIBRA Take 134 mg by mouth daily before breakfast.   furosemide 40 MG tablet Commonly known as:  LASIX 1 tab by mouth in the AM, and 1 tab by mouth in the PM as needed for persistent swelling or weight gain more than  3-5 lbs   losartan 100 MG tablet Commonly known as:  COZAAR Take 50 mg by mouth daily.   metoprolol succinate 50 MG 24 hr tablet Commonly known as:  TOPROL-XL TAKE 1 TABLET BY MOUTH ONCE DAILY   predniSONE 5 MG tablet Commonly known as:  DELTASONE Take 1 tablet (5 mg total) by mouth daily with breakfast.   triamcinolone cream 0.5 % Commonly known as:  KENALOG Apply 1 application topically 2 (two) times daily.   Venlafaxine HCl 225 MG Tb24 1 tab by mouth daily       Past Surgical History:  She  has a past surgical history that includes Tonsillectomy; Cholecystectomy; Abdominal hysterectomy; Hernia repair; Knee arthroscopy (Bilateral); Hammer toe surgery; Brow lift (Bilateral, 07/15/2015); and Ptosis repair (Bilateral, 07/15/2015).  Family History:  Her family history includes Congestive Heart Failure in her mother; Parkinson's disease in her father; Stroke in her father.  Social History:  She  reports that she quit smoking about 32 years ago. Her smoking use included cigarettes. She has a 5.00 pack-year smoking history. She has never used smokeless tobacco. She reports that she does not drink alcohol or use drugs.

## 2018-08-09 NOTE — Patient Instructions (Signed)
Prednisone 5 mg on even numbered days, and 2.5 mg on odd numbered days for 2 weeks.  Then 2.5 mg daily for 2 weeks.  Then stop prednisone.  Follow up in 2 months

## 2018-08-10 ENCOUNTER — Encounter (HOSPITAL_COMMUNITY): Payer: Self-pay

## 2018-08-15 ENCOUNTER — Encounter (HOSPITAL_COMMUNITY): Payer: Self-pay

## 2018-08-17 ENCOUNTER — Encounter (HOSPITAL_COMMUNITY): Payer: Self-pay

## 2018-08-22 ENCOUNTER — Encounter (HOSPITAL_COMMUNITY): Payer: Self-pay

## 2018-08-24 ENCOUNTER — Encounter (HOSPITAL_COMMUNITY): Payer: Self-pay

## 2018-08-29 ENCOUNTER — Encounter (HOSPITAL_COMMUNITY): Payer: Self-pay

## 2018-08-31 ENCOUNTER — Encounter (HOSPITAL_COMMUNITY): Payer: Self-pay

## 2018-09-05 ENCOUNTER — Encounter (HOSPITAL_COMMUNITY): Payer: Self-pay

## 2018-09-07 ENCOUNTER — Encounter (HOSPITAL_COMMUNITY): Payer: Self-pay

## 2018-09-12 ENCOUNTER — Encounter (HOSPITAL_COMMUNITY): Payer: Self-pay

## 2018-09-14 ENCOUNTER — Encounter (HOSPITAL_COMMUNITY): Payer: Self-pay

## 2018-09-19 ENCOUNTER — Encounter (HOSPITAL_COMMUNITY): Payer: Self-pay

## 2018-09-21 ENCOUNTER — Encounter (HOSPITAL_COMMUNITY): Payer: Self-pay

## 2018-09-26 ENCOUNTER — Encounter (HOSPITAL_COMMUNITY): Payer: Self-pay

## 2018-09-28 ENCOUNTER — Encounter (HOSPITAL_COMMUNITY): Payer: Self-pay

## 2018-10-03 ENCOUNTER — Encounter (HOSPITAL_COMMUNITY): Payer: Self-pay

## 2018-10-05 ENCOUNTER — Encounter (HOSPITAL_COMMUNITY): Payer: Self-pay

## 2018-10-05 ENCOUNTER — Other Ambulatory Visit: Payer: Self-pay | Admitting: Internal Medicine

## 2018-10-05 MED ORDER — VENLAFAXINE HCL ER 75 MG PO CP24: 225.0000 mg | ORAL_CAPSULE | Freq: Every day | ORAL | 2 refills | Status: DC

## 2018-10-10 ENCOUNTER — Encounter (HOSPITAL_COMMUNITY): Payer: Self-pay

## 2018-10-12 ENCOUNTER — Encounter (HOSPITAL_COMMUNITY): Payer: Self-pay

## 2018-10-16 ENCOUNTER — Other Ambulatory Visit: Payer: Self-pay

## 2018-10-16 ENCOUNTER — Ambulatory Visit: Payer: Medicare Other | Admitting: Pulmonary Disease

## 2018-10-16 ENCOUNTER — Ambulatory Visit (INDEPENDENT_AMBULATORY_CARE_PROVIDER_SITE_OTHER): Payer: Medicare Other | Admitting: Pulmonary Disease

## 2018-10-16 ENCOUNTER — Encounter: Payer: Self-pay | Admitting: Pulmonary Disease

## 2018-10-16 DIAGNOSIS — J9611 Chronic respiratory failure with hypoxia: Secondary | ICD-10-CM

## 2018-10-16 DIAGNOSIS — J849 Interstitial pulmonary disease, unspecified: Secondary | ICD-10-CM

## 2018-10-16 NOTE — Patient Instructions (Addendum)
Only use your albuterol as a rescue medication to be used if you can't catch your breath by resting or doing a relaxed purse lip breathing pattern.  - The less you use it, the better it will work when you need it. - Ok to use up to 2 puffs  every 4 hours if you must but call for immediate appointment if use goes up over your usual need - Don't leave home without it !!  (think of it like the spare tire for your car)    Continue oxygen therapy as prescribed  >>>maintain oxygen saturations greater than 88 percent  >>>if unable to maintain oxygen saturations please contact the office  >>>do not smoke with oxygen  >>>can use nasal saline gel or nasal saline rinses to moisturize nose if oxygen causes dryness  Follow up with PCP - Dr. Jenny Reichmann about lower extremity swelling and redness     Return in about 4 months (around 02/15/2019), or if symptoms worsen or fail to improve, for Follow up with Dr. Halford Chessman.    Coronavirus (COVID-19) Are you at risk?  Are you at risk for the Coronavirus (COVID-19)?  To be considered HIGH RISK for Coronavirus (COVID-19), you have to meet the following criteria:  . Traveled to [place], [place], [place], [place] or [place]; or in the [place] to [place], [place], [place], or [place]; and have fever, cough, and shortness of breath within the last 2 weeks of travel OR . Been in close contact with a person diagnosed with COVID-19 within the last 2 weeks and have fever, cough, and shortness of breath . IF YOU DO NOT MEET THESE CRITERIA, YOU ARE CONSIDERED LOW RISK FOR COVID-19.  What to do if you are HIGH RISK for COVID-19?  Marland Kitchen If you are having a medical emergency, call 911. . Seek medical care right away. Before you go to a doctor's office, urgent care or emergency department, call ahead and tell them about your recent travel, contact with someone diagnosed with COVID-19, and your symptoms. You should receive instructions from your physician's office regarding  next steps of care.  . When you arrive at healthcare provider, tell the healthcare staff immediately you have returned from visiting [place], [place], [place], [place] or [place]; or traveled in the [place] to [place], [place], [place], or [place]; in the last two weeks or you have been in close contact with a person diagnosed with COVID-19 in the last 2 weeks.   . Tell the health care staff about your symptoms: fever, cough and shortness of breath. . After you have been seen by a medical provider, you will be either: o Tested for (COVID-19) and discharged home on quarantine except to seek medical care if symptoms worsen, and asked to  - Stay home and avoid contact with others until you get your results (4-5 days)  - Avoid travel on public transportation if possible (such as bus, train, or airplane) or o Sent to the Emergency Department by EMS for evaluation, COVID-19 testing, and possible admission depending on your condition and test results.  What to do if you are LOW RISK for COVID-19?  Reduce your risk of any infection by using the same precautions used for avoiding the common cold or flu:  Marland Kitchen Wash your hands often with soap and warm water for at least 20 seconds.  If soap and water are not readily available, use an alcohol-based hand sanitizer with at least 60% alcohol.  Marland Kitchen  If coughing or sneezing, cover your mouth and nose by coughing or sneezing into the elbow areas of your shirt or coat, into a tissue or into your sleeve (not your hands). . Avoid shaking hands with others and consider head nods or verbal greetings only. . Avoid touching your eyes, nose, or mouth with unwashed hands.  . Avoid close contact with people who are sick. . Avoid places or events with large numbers of people in one location, like concerts or sporting events. . Carefully consider travel plans you have or are making. . If you are planning any travel outside or inside the [place], visit the CDC's Travelers'  Health webpage for the latest health notices. . If you have some symptoms but not all symptoms, continue to monitor at home and seek medical attention if your symptoms worsen. . If you are having a medical emergency, call 911.   [place] / e-Visit: [email or web address]         [facility]: [facility]: [phone]                   [facility]: [phone]           It is flu season:   >>> Best ways to protect herself from the flu: Receive the yearly flu vaccine, practice good hand hygiene washing with soap and also using hand sanitizer when available, eat a nutritious meals, get adequate rest, hydrate appropriately   Please contact the office if your symptoms worsen or you have concerns that you are not improving.   Thank you for choosing [facility] for your healthcare, and for allowing [place] to partner with you on your healthcare journey. I am thankful to be able to provide care to you today.   Kathy [name]       Home Oxygen Use, Adult When a medical condition keeps you from getting enough oxygen, your health care provider may instruct you to take extra oxygen at home. Your health care provider will let you know:  When to take oxygen.  For how long to take oxygen.  How quickly oxygen should be delivered (flow rate), in liters per minute (LPM or L/M). Home oxygen can be given through:  A mask.  A nasal cannula. This is a device or tube that goes in the nostrils.  A transtracheal catheter. This is a small, flexible tube placed in the trachea.  A tracheostomy. This is a surgically made opening in the trachea. These devices are connected with tubing to an oxygen source, such as:  A tank. Tanks hold oxygen in gas form. They must be replaced when the oxygen is used up.  A liquid oxygen device. This  holds oxygen in liquid form. It must be replaced when the oxygen is used up.  An oxygen concentrator machine. This filters oxygen in the room. It uses electricity, so you must have a backup cylinder of oxygen in case the power goes out. Supplies needed: To use oxygen, you will need:  A mask, nasal cannula, transtracheal catheter, or tracheostomy.  An oxygen tank, a liquid oxygen device, or an oxygen concentrator.  The tape that your health care provider recommends (optional). If you use a transtracheal catheter and your prescribed flow rate is 1 LPM or greater, you will also need a humidifier. Risks and complications  Fire. This can happen if the oxygen is exposed to a heat source,  flame, or spark.  Injury to skin. This can happen if liquid oxygen touches your skin.  Organ damage. This can happen if you get too little oxygen. How to use oxygen Your health care provider or a representative from your [place] will show you how to use your oxygen device. Follow her or his instructions. The instructions may look something like this: 1. Wash your hands. 2. If you use an oxygen concentrator, make sure it is plugged in. 3. Place one end of the tube into the port on the tank, device, or machine. 4. Place the mask over your nose and mouth. Or, place the nasal cannula and secure it with tape if instructed. If you use a tracheostomy or transtracheal catheter, connect it to the oxygen source as directed. 5. Make sure the liter-flow setting on the machine is at the level prescribed by your health care provider. 6. Turn on the machine or adjust the knob on the tank or device to the correct liter-flow setting. 7. When you are done, turn off and unplug the machine, or turn the knob to OFF. How to clean and care for the oxygen supplies Nasal cannula  Clean it with a warm, wet cloth daily or as needed.  Wash it with a liquid soap once a week.  Rinse it thoroughly once or twice a week.   Replace it every 2-4 weeks.  If you have an infection, such as a cold or pneumonia, change the cannula when you get better. Mask  Replace it every 2-4 weeks.  If you have an infection, such as a cold or pneumonia, change the mask when you get better. Humidifier bottle  Wash the bottle between each refill: ? Wash it with soap and warm water. ? Rinse it thoroughly. ? Disinfect it and its top. ? Air-dry it.  Make sure it is dry before you refill it. Oxygen concentrator  Clean the air filter at least twice a week according to directions from your home medical equipment and service company.  Wipe down the cabinet every day. To do this: ? Unplug the unit. ? Wipe down the cabinet with a damp cloth. ? Dry the cabinet. Other equipment  Change any extra tubing every 1-3 months.  Follow instructions from your health care provider about taking care of any other equipment. Safety tips Fire safety tips   Keep your oxygen and oxygen supplies at least 5 ft away from sources of heat, flames, and sparks at all times.  Do not allow smoking near your oxygen. Put up "no smoking" signs in your home. Avoid smoking areas when in public.  Do not use materials that can burn (are flammable) while you use oxygen.  When you go to a restaurant with portable oxygen, ask to be seated in the nonsmoking section.  Keep a Data processing manager close by. Let your fire department know that you have oxygen in your home.  Test your home smoke detectors regularly. Traveling  Secure your oxygen tank in the vehicle so that it does not move around. Follow instructions from your medical device company about how to safely secure your tank.  Make sure you have enough oxygen for the amount of time you will be away from home.  If you are planning air travel, contact the airline to find out if they allow the use of an approved portable oxygen concentrator. You may also need documents from your health care provider  and medical device company before you travel. General safety tips  If you use an oxygen cylinder, make sure it is in a stand or secured to an object that will not move (fixed object).  If you use liquid oxygen, make sure its container is kept upright.  If you use an oxygen concentrator: ? Dance movement psychotherapist company. Make sure you are given priority service in the event that your power goes out. ? Avoid using extension cords, if possible. Follow these instructions at home:  Use oxygen only as told by your health care provider.  Do not use alcohol or other drugs that make you relax (sedating drugs) unless instructed. They can slow down your breathing rate and make it hard to get in enough oxygen.  Know how and when to order a refill of oxygen.  Always keep a spare tank of oxygen. Plan ahead for holidays when you may not be able to get a prescription filled.  Use water-based lubricants on your lips or nostrils. Do not use oil-based products like petroleum jelly.  To prevent skin irritation on your cheeks or behind your ears, tuck some gauze under the tubing. Contact a health care provider if:  You get headaches often.  You have shortness of breath.  You have a lasting cough.  You have anxiety.  You are sleepy all the time.  You develop an illness that affects your breathing.  You cannot exercise at your regular level.  You are restless.  You have difficult or irregular breathing, and it is getting worse.  You have a fever.  You have persistent redness under your nose. Get help right away if:  You are confused.  You have blue lips or fingernails.  You are struggling to breathe. Summary  Your health care provider or a representative from your [place] will show you how to use your oxygen device. Follow her or his instructions.  If you use an oxygen concentrator, make sure it is plugged in.  Make sure the liter-flow setting on the machine is at the  level prescribed by your health care provider.  Keep your oxygen and oxygen supplies at least 5 ft away from sources of heat, flames, and sparks at all times. This information is not intended to replace advice given to you by your health care provider. Make sure you discuss any questions you have with your health care provider. Document Released: 09/04/2003 Document Revised: 12/01/2017 Document Reviewed: 01/06/2016 Elsevier Interactive Patient Education  2019 [employer].

## 2018-10-16 NOTE — Assessment & Plan Note (Signed)
Plan: Continue oxygen therapy as prescribed Continue to monitor oxygen saturations and record them at least daily Follow-up with our office in 4 months with Dr. Halford Chessman

## 2018-10-16 NOTE — Assessment & Plan Note (Signed)
Assessment: Stable  Plan: Continue oxygen therapy as prescribed Continue to increase daily physical activity as able Remain off of prednisone Rescue inhaler as needed Follow-up with Dr. Halford Chessman in 4 months

## 2018-10-16 NOTE — Progress Notes (Signed)
Virtual Visit via Telephone Note  I connected with [name] on 10/16/18 at 10:30 AM EDT by telephone and verified that I am speaking with the correct person using two identifiers.   I discussed the limitations, risks, security and privacy concerns of performing an evaluation and management service by telephone and the availability of in person appointments. I also discussed with the patient that there may be a patient responsible charge related to this service. The patient expressed understanding and agreed to proceed.   History of Present Illness: 78 y.o. 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  Patient consented to consult via telephone: Yes People present and their role in pt care: Pt   Chief complaint: ILD   78 year old female former smoker followed in our office for ILD with an NSIP pattern on chest imaging.  Patient was last seen 2 months ago at that point time she was transitioned off of prednisone.  Patient reports that she did do this successfully.  She is only needed her rescue inhaler 1 time over the last month.  Patient continues to be maintained on chronic oxygen therapy 2 L 24/7.  She reports her oxygenation has been stable.  She reports her oxygen levels yesterday were 94%.  Patient reports that she has been losing weight successfully by watching her diet as well as taking her fluid pills.  She does report she still does have some lower extremity swelling bilaterally in her lower extremities are slightly red.  She reports this is a chronic problem that her primary care provider is aware of.  Patient also reporting that she occasionally feels more bloated when she is eating.  She is unable to say if she has orthopnea or difficulty breathing when lying flat.   Observations/Objective:  10/16/18 - weight - 247lb 10/15/18 - Sp02 - 94  Pulmonary tests:  Serology 03/01/17 >> ANA negative, RF < 14, CCP < 16,  ANCA negative, SSA/SSB < 0.2 PFT 03/15/17 >> FEV1 1.40 (82%), FEV1% 96, TLC 2.86 (64%), DLCO 70% HP panel 03/15/17 >> negative Aspergillus IgE Panel 07/20/17 >> negative IgE 07/20/17 >> 44 PFT 03/14/18 >> FEV1 1.28 (76%), FEV1% 91, DLCO 59%  Chest imaging:  HRCT chest 02/22/17 >> atherosclerosis, 3 mm LUL nodule, patchy air trapping b/l, patchy reticulation and GGO b/l, minimal traction BTX HRCT chest 01/12/18 >> 3 mm nodule Lt apex, patchy air trapping, patchy subpleural reticulation and GGO, mild traction BTX  Cardiac tests:  Echo 03/21/17 >> EF 65 to 70%, grade 1 DD  No results found for: NITRICOXIDE   Assessment and Plan:  Chronic respiratory failure with hypoxia (HCC) Plan: Continue oxygen therapy as prescribed Continue to monitor oxygen saturations and record them at least daily Follow-up with our office in 4 months with Dr. Halford Chessman  ILD (interstitial lung disease) [facility]) Assessment: Stable  Plan: Continue oxygen therapy as prescribed Continue to increase daily physical activity as able Remain off of prednisone Rescue inhaler as needed Follow-up with Dr. Halford Chessman in 4 months    Follow Up Instructions:  Return in about 4 months (around 02/15/2019), or if symptoms worsen or fail to improve, for Follow up with Dr. Halford Chessman.    I discussed the assessment and treatment plan with the patient. The patient was provided an opportunity to ask questions and all were answered. The patient agreed with the plan and demonstrated an understanding of the instructions.   The patient was advised to call back  or seek an in-person evaluation if the symptoms worsen or if the condition fails to improve as anticipated.  I provided 24 minutes of non-face-to-face time during this encounter.   Lauraine Rinne, NP

## 2018-10-17 ENCOUNTER — Encounter (HOSPITAL_COMMUNITY): Payer: Self-pay

## 2018-10-19 ENCOUNTER — Encounter (HOSPITAL_COMMUNITY): Payer: Self-pay

## 2018-10-24 ENCOUNTER — Encounter (HOSPITAL_COMMUNITY): Payer: Self-pay

## 2018-10-26 ENCOUNTER — Encounter (HOSPITAL_COMMUNITY): Payer: Self-pay

## 2018-10-31 ENCOUNTER — Encounter (HOSPITAL_COMMUNITY): Payer: Self-pay

## 2018-11-02 ENCOUNTER — Encounter (HOSPITAL_COMMUNITY): Payer: Self-pay

## 2018-11-07 ENCOUNTER — Encounter (HOSPITAL_COMMUNITY): Payer: Self-pay

## 2018-11-09 ENCOUNTER — Telehealth: Payer: Self-pay | Admitting: Internal Medicine

## 2018-11-09 ENCOUNTER — Encounter (HOSPITAL_COMMUNITY): Payer: Self-pay

## 2018-11-09 MED ORDER — VENLAFAXINE HCL ER 75 MG PO CP24: 225.0000 mg | ORAL_CAPSULE | Freq: Every day | ORAL | 2 refills | Status: DC

## 2018-11-09 NOTE — Addendum Note (Signed)
Addended by: Biagio Borg on: 11/09/2018 12:39 PM   Modules accepted: Orders

## 2018-11-09 NOTE — Telephone Encounter (Signed)
Copied from [place] [id number]. Topic: Quick Communication - Rx Refill/Question >> Nov 09, 2018 10:34 AM Kathy [name] wrote: Medication: venlafaxine XR (EFFEXOR XR) 75 MG 24 hr capsule   Patient states that she has picked up 2 different bottles of this medication from pharmacy recently with different mg/directions. She is requesting a call back to discuss which medication she should be taking. Patient also had a question as to if she should continue taking  OTC medication named "keto pills" that she would like to discuss with Dr. Jenny Reichmann. Please advise.

## 2018-11-09 NOTE — Telephone Encounter (Signed)
Done erx 

## 2018-11-14 ENCOUNTER — Encounter (HOSPITAL_COMMUNITY): Payer: Self-pay

## 2018-11-15 NOTE — Telephone Encounter (Addendum)
Patient is calling in stating she would still like a call back in regards to the medications with the different mg/directions. Patient states she is still waiting due to being confused due to them all being the same. Call back is [phone]. Marland Kitchen

## 2018-11-15 NOTE — Telephone Encounter (Signed)
Spoke to pt, she is very confused on what medications that she is suppose to be taking. She thinks she has duplicates of the same medications of different doses and different directions. She states she isn't sure if she's missing medications for lack of taking them or not having them at all. She stated she doesn't want to die from taking something more than what she should. I have scheduled her for an OV tomorrow to bring all meds she has with her for a discussion with PCP.   Sharee Pimple- I see that she is a medicare pt, is this something we could tag team on tomorrow? Her appt is at 1.40pm.

## 2018-11-16 ENCOUNTER — Encounter (HOSPITAL_COMMUNITY): Payer: Self-pay

## 2018-11-16 ENCOUNTER — Other Ambulatory Visit: Payer: Self-pay

## 2018-11-16 ENCOUNTER — Ambulatory Visit (INDEPENDENT_AMBULATORY_CARE_PROVIDER_SITE_OTHER): Payer: Medicare Other | Admitting: Internal Medicine

## 2018-11-16 ENCOUNTER — Other Ambulatory Visit (INDEPENDENT_AMBULATORY_CARE_PROVIDER_SITE_OTHER): Payer: Medicare Other

## 2018-11-16 ENCOUNTER — Encounter: Payer: Self-pay | Admitting: Internal Medicine

## 2018-11-16 ENCOUNTER — Other Ambulatory Visit: Payer: Self-pay | Admitting: Internal Medicine

## 2018-11-16 VITALS — BP 136/88 | HR 105 | Temp 97.6°F | Ht 60.0 in | Wt 250.0 lb

## 2018-11-16 DIAGNOSIS — N183 Chronic kidney disease, stage 3 unspecified: Secondary | ICD-10-CM

## 2018-11-16 DIAGNOSIS — R739 Hyperglycemia, unspecified: Secondary | ICD-10-CM | POA: Diagnosis not present

## 2018-11-16 DIAGNOSIS — F32A Depression, unspecified: Secondary | ICD-10-CM

## 2018-11-16 DIAGNOSIS — E538 Deficiency of other specified B group vitamins: Secondary | ICD-10-CM

## 2018-11-16 DIAGNOSIS — J849 Interstitial pulmonary disease, unspecified: Secondary | ICD-10-CM

## 2018-11-16 DIAGNOSIS — I10 Essential (primary) hypertension: Secondary | ICD-10-CM

## 2018-11-16 DIAGNOSIS — F329 Major depressive disorder, single episode, unspecified: Secondary | ICD-10-CM

## 2018-11-16 DIAGNOSIS — E785 Hyperlipidemia, unspecified: Secondary | ICD-10-CM

## 2018-11-16 DIAGNOSIS — E559 Vitamin D deficiency, unspecified: Secondary | ICD-10-CM

## 2018-11-16 DIAGNOSIS — E611 Iron deficiency: Secondary | ICD-10-CM

## 2018-11-16 LAB — BASIC METABOLIC PANEL
BUN: 12 mg/dL (ref 6–23)
CO2: 32 mEq/L (ref 19–32)
Calcium: 9.2 mg/dL (ref 8.4–10.5)
Chloride: 95 mEq/L — ABNORMAL LOW (ref 96–112)
Creatinine, Ser: 0.86 mg/dL (ref 0.40–1.20)
GFR: 63.83 mL/min (ref >60.00)
Glucose, Bld: 91 mg/dL (ref 70–99)
Potassium: 3.8 mEq/L (ref 3.5–5.1)
Sodium: 138 mEq/L (ref 135–145)

## 2018-11-16 LAB — CBC WITH DIFFERENTIAL/PLATELET
Basophils Absolute: 0.1 10*3/uL (ref 0.0–0.1)
Basophils Relative: 0.5 % (ref 0.0–3.0)
Eosinophils Absolute: 0.1 10*3/uL (ref 0.0–0.7)
Eosinophils Relative: 1.4 % (ref 0.0–5.0)
HCT: 35.8 % — ABNORMAL LOW (ref 36.0–46.0)
Hemoglobin: 12.2 g/dL (ref 12.0–15.0)
Lymphocytes Relative: 27.5 % (ref 12.0–46.0)
Lymphs Abs: 2.8 10*3/uL (ref 0.7–4.0)
MCHC: 34.1 g/dL (ref 30.0–36.0)
MCV: 91.7 fl (ref 78.0–100.0)
Monocytes Absolute: 0.8 10*3/uL (ref 0.1–1.0)
Monocytes Relative: 8.2 % (ref 3.0–12.0)
Neutro Abs: 6.4 10*3/uL (ref 1.4–7.7)
Neutrophils Relative %: 62.4 % (ref 43.0–77.0)
Platelets: 237 10*3/uL (ref 150.0–400.0)
RBC: 3.91 Mil/uL (ref 3.87–5.11)
RDW: 13.6 % (ref 11.5–15.5)
WBC: 10.3 10*3/uL (ref 4.0–10.5)

## 2018-11-16 LAB — LIPID PANEL
Cholesterol: 210 mg/dL — ABNORMAL HIGH (ref 0–200)
HDL: 73.7 mg/dL (ref >39.00)
LDL Cholesterol: 115 mg/dL — ABNORMAL HIGH (ref 0–99)
NonHDL: 136.63
Total CHOL/HDL Ratio: 3
Triglycerides: 110 mg/dL (ref 0.0–149.0)
VLDL: 22 mg/dL (ref 0.0–40.0)

## 2018-11-16 LAB — IBC PANEL
Iron: 38 ug/dL — ABNORMAL LOW (ref 42–145)
Saturation Ratios: 12 % — ABNORMAL LOW (ref 20.0–50.0)
Transferrin: 227 mg/dL (ref 212.0–360.0)

## 2018-11-16 LAB — HEPATIC FUNCTION PANEL
ALT: 8 U/L (ref 0–35)
AST: 15 U/L (ref 0–37)
Albumin: 3.8 g/dL (ref 3.5–5.2)
Alkaline Phosphatase: 76 U/L (ref 39–117)
Bilirubin, Direct: 0.1 mg/dL (ref 0.0–0.3)
Total Bilirubin: 0.4 mg/dL (ref 0.2–1.2)
Total Protein: 7.9 g/dL (ref 6.0–8.3)

## 2018-11-16 LAB — VITAMIN D 25 HYDROXY (VIT D DEFICIENCY, FRACTURES): VITD: 14 ng/mL — ABNORMAL LOW (ref 30.00–100.00)

## 2018-11-16 LAB — TSH: TSH: 3.38 u[IU]/mL (ref 0.35–4.50)

## 2018-11-16 LAB — HEMOGLOBIN A1C: Hgb A1c MFr Bld: 6.2 % (ref 4.6–6.5)

## 2018-11-16 LAB — VITAMIN B12: Vitamin B-12: 155 pg/mL — ABNORMAL LOW (ref 211–911)

## 2018-11-16 MED ORDER — ALBUTEROL SULFATE HFA 108 (90 BASE) MCG/ACT IN AERS: 1.0000 | INHALATION_SPRAY | Freq: Four times a day (QID) | RESPIRATORY_TRACT | 11 refills | Status: DC | PRN

## 2018-11-16 MED ORDER — LOSARTAN POTASSIUM 100 MG PO TABS: 50.0000 mg | ORAL_TABLET | Freq: Every day | ORAL | 3 refills | Status: DC

## 2018-11-16 MED ORDER — FENOFIBRATE MICRONIZED 134 MG PO CAPS: 134.0000 mg | ORAL_CAPSULE | Freq: Every day | ORAL | 3 refills | Status: DC

## 2018-11-16 MED ORDER — METOPROLOL SUCCINATE ER 50 MG PO TB24: 50.0000 mg | ORAL_TABLET | Freq: Every day | ORAL | 3 refills | Status: DC

## 2018-11-16 MED ORDER — VENLAFAXINE HCL ER 75 MG PO CP24: 225.0000 mg | ORAL_CAPSULE | Freq: Every day | ORAL | 3 refills | Status: DC

## 2018-11-16 MED ORDER — POLYSACCHARIDE IRON COMPLEX 150 MG PO CAPS: 150.0000 mg | ORAL_CAPSULE | Freq: Every day | ORAL | 1 refills | Status: DC

## 2018-11-16 MED ORDER — VITAMIN D (ERGOCALCIFEROL) 1.25 MG (50000 UNIT) PO CAPS: 50000.0000 [IU] | ORAL_CAPSULE | ORAL | 0 refills | Status: DC

## 2018-11-16 MED ORDER — TRIAMCINOLONE ACETONIDE 0.5 % EX CREA: 1.0000 "application " | TOPICAL_CREAM | Freq: Two times a day (BID) | CUTANEOUS | 1 refills | Status: DC

## 2018-11-16 MED ORDER — FUROSEMIDE 40 MG PO TABS: ORAL_TABLET | ORAL | 11 refills | Status: DC

## 2018-11-16 MED ORDER — ETODOLAC 300 MG PO CAPS: 300.0000 mg | ORAL_CAPSULE | Freq: Two times a day (BID) | ORAL | 1 refills | Status: DC

## 2018-11-16 NOTE — Assessment & Plan Note (Signed)
stable overall by history and exam, recent data reviewed with pt, and pt to continue medical treatment as before,  to f/u any worsening symptoms or concerns, for lab f/u today 

## 2018-11-16 NOTE — Assessment & Plan Note (Signed)
To restart effexor at 225 qd,  to f/u any worsening symptoms or concerns

## 2018-11-16 NOTE — Assessment & Plan Note (Signed)
stable overall by history and exam, recent data reviewed with pt, and pt to continue medical treatment as before,  to f/u any worsening symptoms or concerns  

## 2018-11-16 NOTE — Progress Notes (Signed)
Subjective:    Patient ID: Kathy [name], female    DOB: Dec 12, 1940, 78 y.o.   MRN: [id number]  HPI  Here for yearly f/u;  Overall doing ok;  Pt denies Chest pain, worsening SOB, DOE, wheezing, orthopnea, PND, worsening LE edema, palpitations, dizziness or syncope, though has bilateral chronic leg swelling with mild erythema  Daughter sends note today asking for antibiotic, but no fever, pain, and redness/swelling actually better in the last week..  Pt denies neurological change such as new headache, facial or extremity weakness.  Pt denies polydipsia, polyuria, or low sugar symptoms. Pt states overall good compliance with treatment and medications, good tolerability, and has been trying to follow appropriate diet.  Pt denies worsening depressive symptoms, suicidal ideation or panic. No fever, night sweats, wt loss, loss of appetite, or other constitutional symptoms.  Pt states good ability with ADL's, has low fall risk, home safety reviewed and adequate, no other significant changes in hearing or vision, and not active with exercise.  Has several questions about her meds and purpose Past Medical History:  Diagnosis Date  . Arthritis    fingers  . CKD (chronic kidney disease) stage 3, GFR 30-59 ml/min (HCC) 11/30/2017  . Depression   . Dizziness    in AM, getting out of bed  . Dysrhythmia    "skips a beat" sometimes - followed by PCP  . Endometrial ca ([place]) 11/30/2017   S/p surgury 1990's  . GERD (gastroesophageal reflux disease) 11/30/2017  . HLD (hyperlipidemia) 11/30/2017  . Hypercholesteremia   . Hypertension   . Neuropathy    bilateral feet  . Shortness of breath dyspnea   . Sleep apnea    has CPAP, doesn't use  . Umbilical hernia    Past Surgical History:  Procedure Laterality Date  . ABDOMINAL HYSTERECTOMY    . BROW LIFT Bilateral 07/15/2015   Procedure: BLEPHAROPLASTY;  Surgeon: Karle Starch, MD;  Location: [place];  Service: Ophthalmology;  Laterality: Bilateral;  .  CHOLECYSTECTOMY    . HAMMER TOE SURGERY    . HERNIA REPAIR    . KNEE ARTHROSCOPY Bilateral   . PTOSIS REPAIR Bilateral 07/15/2015   Procedure: PTOSIS REPAIR;  Surgeon: Karle Starch, MD;  Location: [place];  Service: Ophthalmology;  Laterality: Bilateral;  CPAP  . TONSILLECTOMY      reports that she quit smoking about 32 years ago. Her smoking use included cigarettes. She has a 5.00 pack-year smoking history. She has never used smokeless tobacco. She reports that she does not drink alcohol or use drugs. family history includes Congestive Heart Failure in her mother; Parkinson's disease in her father; Stroke in her father. Allergies  Allergen Reactions  . Lipitor [Atorvastatin] Other (See Comments)    Memory issues  . Requip [Ropinirole Hcl] Other (See Comments)    Pt reports feeling generally unwell on this medication   No current outpatient medications on file prior to visit.   No current facility-administered medications on file prior to visit.    Review of Systems Constitutional: Negative for other unusual diaphoresis, sweats, appetite or weight changes HENT: Negative for other worsening hearing loss, ear pain, facial swelling, mouth sores or neck stiffness.   Eyes: Negative for other worsening pain, redness or other visual disturbance.  Respiratory: Negative for other stridor or swelling Cardiovascular: Negative for other palpitations or other chest pain  Gastrointestinal: Negative for worsening diarrhea or loose stools, blood in stool, distention or other pain Genitourinary: Negative for hematuria,  flank pain or other change in urine volume.  Musculoskeletal: Negative for myalgias or other joint swelling.  Skin: Negative for other color change, or other wound or worsening drainage.  Neurological: Negative for other syncope or numbness. Hematological: Negative for other adenopathy or swelling Psychiatric/Behavioral: Negative for hallucinations, other worsening  agitation, SI, self-injury, or new decreased concentration All other system neg per pt    Objective:   Physical Exam BP 136/88   Pulse (!) 105   Temp 97.6 F (36.4 C) (Oral)   Ht 5' (1.524 m)   Wt 250 lb (113.4 kg)   SpO2 96%   BMI 48.82 kg/m  VS noted,  Constitutional: Pt is oriented to person, place, and time. Appears well-developed and well-nourished, in no significant distress and comfortable Head: Normocephalic and atraumatic  Eyes: Conjunctivae and EOM are normal. Pupils are equal, round, and reactive to light Right Ear: External ear normal without discharge Left Ear: External ear normal without discharge Nose: Nose without discharge or deformity Mouth/Throat: Oropharynx is without other ulcerations and moist  Neck: Normal range of motion. Neck supple. No JVD present. No tracheal deviation present or significant neck LA or mass Cardiovascular: Normal rate, regular rhythm, normal heart sounds and intact distal pulses.   Pulmonary/Chest: WOB normal and breath sounds without rales or wheezing  Abdominal: Soft. Bowel sounds are normal. NT. No HSM  Musculoskeletal: Normal range of motion. Exhibits no edema Lymphadenopathy: Has no other cervical adenopathy.  Neurological: Pt is alert and oriented to person, place, and time. Pt has normal reflexes. No cranial nerve deficit. Motor grossly intact, Gait intact Skin: Skin is warm and dry. No rash noted or new ulcerations Psychiatric:  Has depressed mood and affect. Behavior is normal without agitation] No other exam findings Lab Results  Component Value Date   WBC 10.3 11/16/2018   HGB 12.2 11/16/2018   HCT 35.8 (L) 11/16/2018   PLT 237.0 11/16/2018   GLUCOSE 91 11/16/2018   CHOL 210 (H) 11/16/2018   TRIG 110.0 11/16/2018   HDL 73.70 11/16/2018   LDLCALC 115 (H) 11/16/2018   ALT 8 11/16/2018   AST 15 11/16/2018   NA 138 11/16/2018   K 3.8 11/16/2018   CL 95 (L) 11/16/2018   CREATININE 0.86 11/16/2018   BUN 12 11/16/2018    CO2 32 11/16/2018   TSH 3.38 11/16/2018   HGBA1C 6.2 11/16/2018      Assessment & Plan:

## 2018-11-16 NOTE — Patient Instructions (Signed)
Please continue all other medications as is on your current list, and refills have been done  Please have the pharmacy call with any other refills you may need.  Please continue your efforts at being more active, low cholesterol diet, and weight control.  You are otherwise up to date with prevention measures today.  Please keep your appointments with your specialists as you may have planned  Please go to the LAB in the Basement (turn left off the elevator) for the tests to be done today  You will be contacted by phone if any changes need to be made immediately.  Otherwise, you will receive a letter about your results with an explanation, but please check with MyChart first.  Please remember to sign up for MyChart if you have not done so, as this will be important to you in the future with finding out test results, communicating by private email, and scheduling acute appointments online when needed.  Please return in 6 months, or sooner if needed

## 2018-11-16 NOTE — Assessment & Plan Note (Signed)
stable overall by history and exam, recent data reviewed with pt, and pt to continue medical treatment as before,  to f/u any worsening symptoms or concerns, for a1c with labs 

## 2018-11-16 NOTE — Assessment & Plan Note (Addendum)
Conts on home o2 2L continuous  Note:  Total time for pt hx, exam, review of record with pt in the room, determination of diagnoses and plan for further eval and tx is > 40 min, with over 50% spent in coordination and counseling of patient including the differential dx, tx, further evaluation and other management of ILD, HTN, hyperglycemia, HLD, depression, CKD

## 2018-11-16 NOTE — Assessment & Plan Note (Signed)
stable overall by history and exam, recent data reviewed with pt, and pt to continue medical treatment as before,  to f/u any worsening symptoms or concerns, for lab today  

## 2018-11-17 ENCOUNTER — Telehealth: Payer: Self-pay

## 2018-11-17 NOTE — Telephone Encounter (Signed)
Pt has been informed of results and expressed understanding.  °

## 2018-11-17 NOTE — Telephone Encounter (Signed)
-----   Message from Biagio Borg, MD sent at 11/16/2018  6:46 PM EDT ----- Left message on MyChart, pt to cont same tx except  The test results show that your current treatment is OK, except the Vitamin D level is quite low, the Vitamin B12 level is low, and iron level is also low.    We need to: 1) start Vitamin D 50000 units weekly for 12 weeks, then plan to take OTC Vitamin D3 at 2000 units per day 2)  Start monthly B12 shots until your next visit 3)  Start Nu-iron 150 mg - 1 per day 4)  Referral to Gastroenterology to check for interna bleeding leading to iron deficiency you may not have noticed  Shirron to please inform pt, I will do rx x 2 and referral, and please arrange for b12 shots

## 2018-11-21 ENCOUNTER — Encounter (HOSPITAL_COMMUNITY): Payer: Self-pay

## 2018-11-23 ENCOUNTER — Encounter (HOSPITAL_COMMUNITY): Payer: Self-pay

## 2018-11-28 ENCOUNTER — Encounter (HOSPITAL_COMMUNITY): Payer: Self-pay

## 2018-11-30 ENCOUNTER — Telehealth: Payer: Self-pay | Admitting: General Surgery

## 2018-11-30 ENCOUNTER — Encounter (HOSPITAL_COMMUNITY): Payer: Self-pay

## 2018-11-30 NOTE — Telephone Encounter (Signed)
LVM on patients home and mobile to call and pre-screen.

## 2018-12-01 ENCOUNTER — Ambulatory Visit (INDEPENDENT_AMBULATORY_CARE_PROVIDER_SITE_OTHER): Payer: Medicare Other | Admitting: Gastroenterology

## 2018-12-01 ENCOUNTER — Encounter: Payer: Self-pay | Admitting: Gastroenterology

## 2018-12-01 ENCOUNTER — Other Ambulatory Visit: Payer: Self-pay

## 2018-12-01 DIAGNOSIS — D508 Other iron deficiency anemias: Secondary | ICD-10-CM | POA: Diagnosis not present

## 2018-12-01 DIAGNOSIS — R1030 Lower abdominal pain, unspecified: Secondary | ICD-10-CM | POA: Diagnosis not present

## 2018-12-01 NOTE — Progress Notes (Signed)
This patient contacted our office requesting a physician telemedicine consultation regarding clinical questions and/or test results. Interactive audio and video telecommunications were attempted between this provider and the patient.  However, this technology failed due to the patient having technical difficulties OR they did not have access to video capabilities.  We continued and completed the visit with audio only.  If new patient, they were referred by Cathlean Cower, MD  Participants on the conference : myself and patient   The patient consented to this consultation and was aware that a charge will be placed through their insurance. She was also made aware of the limitations of delivering care by telemedicine.  I was in my office and the patient was at home   Encounter time:  Total time 30 minutes, with 22 minutes spent with patient on phone   _____________________________________________________________________________________________              [facility] Consult Note:  History: Kathy [name] 12/01/2018  Referring provider: Biagio Borg, MD  Reason for consult/chief complaint: No chief complaint on file. low iron level  Subjective  HPI: Most recent primary care note reviewed, outlining patient's chronic medical issues.  Among other things, CKD, morbid obesity and interstitial lung disease requiring supplemental oxygen. Labs revealed low vitamin B12, low iron saturation (no ferritin checked), iron level = 32 (normal at least 42), low vitamin D.  Supplements for all were ordered and referral to GI for concern of GI blood loss.  Reports she has has occasional cramps "across front of stomach" and toward RLQ for couple weeks. Often comes on with meals, then relived after BM soon afterwards.  Worse with stress, which she feels is increased lately due to concerns of COVID and her multiple medical issues.    Mentioned this to primary care, recorded exam normal.    She feels all her med problems make her feel "bad".  Dyspnea, fatigue. Has not seen blood in stool. BM's normal - typically goes very soon after meals.  Thinks she has an abdominal wall hernia when bends over (at site of prior surgery where mesh was placed)  Believes she had a colonoscopy in past at [facility] (? Within last 5 years)  She has not yet heard from primary care to set up B12 shots, has not yet picked up iron or vitamin D at the pharmacy.  ROS:  Review of Systems  Denies exertional chest pain. Chronic dyspnea at rest and with exertion. Arthritic pain requiring chronic NSAIDs Poor sleep at times. Fatigue  Remainder of systems negative except as above  Past Medical History: Past Medical History:  Diagnosis Date  . Arthritis    fingers  . CKD (chronic kidney disease) stage 3, GFR 30-59 ml/min (HCC) 11/30/2017  . Depression   . Dizziness    in AM, getting out of bed  . Dysrhythmia    "skips a beat" sometimes - followed by PCP  . Endometrial ca ([place]) 11/30/2017   S/p surgury 1990's  . GERD (gastroesophageal reflux disease) 11/30/2017  . HLD (hyperlipidemia) 11/30/2017  . Hypercholesteremia   . Hypertension   . Neuropathy    bilateral feet  . Shortness of breath dyspnea   . Sleep apnea    has CPAP, doesn't use  . Umbilical hernia      Past Surgical History: Past Surgical History:  Procedure Laterality Date  . ABDOMINAL HYSTERECTOMY    . BROW LIFT Bilateral 07/15/2015   Procedure: BLEPHAROPLASTY;  Surgeon: Karle Starch, MD;  Location: [place];  Service: Ophthalmology;  Laterality: Bilateral;  . CHOLECYSTECTOMY    . HAMMER TOE SURGERY    . HERNIA REPAIR    . KNEE ARTHROSCOPY Bilateral   . PTOSIS REPAIR Bilateral 07/15/2015   Procedure: PTOSIS REPAIR;  Surgeon: Karle Starch, MD;  Location: [place];  Service: Ophthalmology;  Laterality: Bilateral;  CPAP  . TONSILLECTOMY       Family History: Family History  Problem Relation Age  of Onset  . Congestive Heart Failure Mother   . Stroke Father   . Parkinson's disease Father     Social History: Social History   Socioeconomic History  . Marital status: Married    Spouse name: Not on file  . Number of children: Not on file  . Years of education: Not on file  . Highest education level: Not on file  Occupational History  . Occupation: Retired  Scientific laboratory technician  . Financial resource strain: Not on file  . Food insecurity:    Worry: Not on file    Inability: Not on file  . Transportation needs:    Medical: Not on file    Non-medical: Not on file  Tobacco Use  . Smoking status: Former Smoker    Packs/day: 1.00    Years: 5.00    Pack years: 5.00    Types: Cigarettes    Last attempt to quit: 06/28/1986    Years since quitting: 32.4  . Smokeless tobacco: Never Used  . Tobacco comment: quit 40+ yrs ago, 1 PPD for a few years  Substance and Sexual Activity  . Alcohol use: No  . Drug use: No  . Sexual activity: Not on file  Lifestyle  . Physical activity:    Days per week: Not on file    Minutes per session: Not on file  . Stress: Not on file  Relationships  . Social connections:    Talks on phone: Not on file    Gets together: Not on file    Attends religious service: Not on file    Active member of club or organization: Not on file    Attends meetings of clubs or organizations: Not on file    Relationship status: Not on file  Other Topics Concern  . Not on file  Social History Narrative  . Not on file    Allergies: Allergies  Allergen Reactions  . Lipitor [Atorvastatin] Other (See Comments)    Memory issues  . Requip [Ropinirole Hcl] Other (See Comments)    Pt reports feeling generally unwell on this medication    Outpatient Meds: Current Outpatient Medications  Medication Sig Dispense Refill  . albuterol (VENTOLIN HFA) 108 (90 Base) MCG/ACT inhaler Inhale 1-2 puffs into the lungs every 6 (six) hours as needed for wheezing or shortness of  breath. 1 Inhaler 11  . etodolac (LODINE) 300 MG capsule Take 1 capsule (300 mg total) by mouth 2 (two) times daily. 180 capsule 1  . fenofibrate micronized (LOFIBRA) 134 MG capsule Take 1 capsule (134 mg total) by mouth daily before breakfast. 90 capsule 3  . furosemide (LASIX) 40 MG tablet 1 tab by mouth in the AM, and 1 tab by mouth in the PM as needed for persistent swelling or weight gain more than 3-5 lbs 60 tablet 11  . iron polysaccharides (NU-IRON) 150 MG capsule Take 1 capsule (150 mg total) by mouth daily. 90 capsule 1  . losartan (COZAAR) 100 MG tablet Take 0.5 tablets (50 mg  total) by mouth daily. 90 tablet 3  . metoprolol succinate (TOPROL-XL) 50 MG 24 hr tablet Take 1 tablet (50 mg total) by mouth daily. Take with or immediately following a meal. 90 tablet 3  . triamcinolone cream (KENALOG) 0.5 % Apply 1 application topically 2 (two) times daily. 30 g 1  . venlafaxine XR (EFFEXOR XR) 75 MG 24 hr capsule Take 3 capsules (225 mg total) by mouth daily with breakfast. 270 capsule 3  . Vitamin D, Ergocalciferol, (DRISDOL) 1.25 MG (50000 UT) CAPS capsule Take 1 capsule (50,000 Units total) by mouth every 7 (seven) days. 12 capsule 0   No current facility-administered medications for this visit.       ___________________________________________________________________ Objective   Exam:  No exam - virtual visit  Labs:  CBC Latest Ref Rng & Units 11/16/2018 05/23/2018 04/14/2018  WBC 4.0 - 10.5 K/uL 10.3 7.5 16.7(H)  Hemoglobin 12.0 - 15.0 g/dL 12.2 13.6 11.7(L)  Hematocrit 36.0 - 46.0 % 35.8(L) 44.3 34.7(L)  Platelets 150.0 - 400.0 K/uL 237.0 217 223.0   CMP Latest Ref Rng & Units 11/16/2018 05/23/2018 04/14/2018  Glucose 70 - 99 mg/dL 91 124(H) 168(H)  BUN 6 - 23 mg/dL 12 24(H) 20  Creatinine 0.40 - 1.20 mg/dL 0.86 1.35(H) 1.20  Sodium 135 - 145 mEq/L 138 140 140  Potassium 3.5 - 5.1 mEq/L 3.8 3.5 3.9  Chloride 96 - 112 mEq/L 95(L) 100 100  CO2 19 - 32 mEq/L 32 30 30   Calcium 8.4 - 10.5 mg/dL 9.2 8.9 8.9  Total Protein 6.0 - 8.3 g/dL 7.9 6.2(L) -  Total Bilirubin 0.2 - 1.2 mg/dL 0.4 0.7 -  Alkaline Phos 39 - 117 U/L 76 36(L) -  AST 0 - 37 U/L 15 25 -  ALT 0 - 35 U/L 8 23 -    Iron 38, transferrin 227, 12% saturation  Vitamin B12 low at 155  Hemoglobin A1c 6.2 TSH 3.4  Assessment: Encounter Diagnoses  Name Primary?  . Other iron deficiency anemia Yes  . Lower abdominal pain    Very mildly decreased iron level and saturation with normal hemoglobin. 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 Probable recurrence of abdominal wall hernia.  No overt GI bleeding, recent change in bowel habits, dysphagia or weight loss.  I do not think this mild iron deficiency with normal hemoglobin is likely to be the result of chronic GI blood loss.  CKD with fluctuating hemoglobin over the last year, recently normal.  Probably has a resultant mild decrease of intestinal iron absorption.  Plan:  Risks of EGD and colonoscopy seem to greatly outweigh the expected benefits in this chronically ill patient, considering all of the above. I will send a note to primary care and asked him to contact her to set of the B12 injections.  A brief course of oral low-dose iron supplement is reasonable, I will leave the dose and duration to the discretion of primary care.  Thank you for the courtesy of this consult.  Please call me with any questions or concerns.  Nelida Meuse III  CC: Referring provider noted above

## 2018-12-04 ENCOUNTER — Encounter: Payer: Self-pay | Admitting: Internal Medicine

## 2018-12-04 ENCOUNTER — Ambulatory Visit (INDEPENDENT_AMBULATORY_CARE_PROVIDER_SITE_OTHER): Payer: Medicare Other | Admitting: Internal Medicine

## 2018-12-04 DIAGNOSIS — R739 Hyperglycemia, unspecified: Secondary | ICD-10-CM | POA: Diagnosis not present

## 2018-12-04 DIAGNOSIS — E785 Hyperlipidemia, unspecified: Secondary | ICD-10-CM | POA: Diagnosis not present

## 2018-12-04 DIAGNOSIS — E559 Vitamin D deficiency, unspecified: Secondary | ICD-10-CM | POA: Diagnosis not present

## 2018-12-04 DIAGNOSIS — J9611 Chronic respiratory failure with hypoxia: Secondary | ICD-10-CM | POA: Diagnosis not present

## 2018-12-04 DIAGNOSIS — E538 Deficiency of other specified B group vitamins: Secondary | ICD-10-CM | POA: Diagnosis not present

## 2018-12-04 DIAGNOSIS — I10 Essential (primary) hypertension: Secondary | ICD-10-CM | POA: Diagnosis not present

## 2018-12-04 MED ORDER — VITAMIN D (ERGOCALCIFEROL) 1.25 MG (50000 UNIT) PO CAPS: 50000.0000 [IU] | ORAL_CAPSULE | ORAL | 0 refills | Status: DC

## 2018-12-04 MED ORDER — CYANOCOBALAMIN 1000 MCG/ML IJ SOLN: 1000.0000 ug | INTRAMUSCULAR | 3 refills | Status: DC

## 2018-12-04 NOTE — Assessment & Plan Note (Signed)
stable overall by history and exam, recent data reviewed with pt, and pt to continue medical treatment as before,  to f/u any worsening symptoms or concerns  

## 2018-12-04 NOTE — Assessment & Plan Note (Signed)
For oral replacement,  to f/u any worsening symptoms or concerns 

## 2018-12-04 NOTE — Progress Notes (Signed)
Patient ID: Kathy [name], female   DOB: 06-13-1941, 78 y.o.   MRN: [id number]  Virtual Visit via Video Note  I connected with Kathy [name] on 12/04/18 at  1:20 PM EDT by a video enabled telemedicine application and verified that I am speaking with the correct person using two identifiers.  Location: Patient: at home Provider: at office   I discussed the limitations of evaluation and management by telemedicine and the availability of in person appointments. The patient expressed understanding and agreed to proceed.  History of Present Illness: Here to f/u; overall doing ok,  Pt denies chest pain, increasing sob or doe, wheezing, orthopnea, PND, increased LE swelling, palpitations, dizziness or syncope.  Pt denies new neurological symptoms such as new headache, or facial or extremity weakness or numbness.  Pt denies polydipsia, polyuria, or low sugar episode.  Pt states overall good compliance with meds, mostly trying to follow appropriate diet, with wt overall stable,  but little exercise however.  Sat 96% on 2l recent.  , BP < 140/90.  Not using CPAP but has been able to  Lose wt overall from 270 to 244.     Past Medical History:  Diagnosis Date  . Arthritis    fingers  . CKD (chronic kidney disease) stage 3, GFR 30-59 ml/min (HCC) 11/30/2017  . Depression   . Dizziness    in AM, getting out of bed  . Dysrhythmia    "skips a beat" sometimes - followed by PCP  . Endometrial ca ([place]) 11/30/2017   S/p surgury 1990's  . GERD (gastroesophageal reflux disease) 11/30/2017  . HLD (hyperlipidemia) 11/30/2017  . Hypercholesteremia   . Hypertension   . Neuropathy    bilateral feet  . Shortness of breath dyspnea   . Sleep apnea    has CPAP, doesn't use  . Umbilical hernia    Past Surgical History:  Procedure Laterality Date  . ABDOMINAL HYSTERECTOMY    . BROW LIFT Bilateral 07/15/2015   Procedure: BLEPHAROPLASTY;  Surgeon: Karle Starch, MD;  Location: [place];  Service: Ophthalmology;   Laterality: Bilateral;  . CHOLECYSTECTOMY    . HAMMER TOE SURGERY    . HERNIA REPAIR    . KNEE ARTHROSCOPY Bilateral   . PTOSIS REPAIR Bilateral 07/15/2015   Procedure: PTOSIS REPAIR;  Surgeon: Karle Starch, MD;  Location: [place];  Service: Ophthalmology;  Laterality: Bilateral;  CPAP  . TONSILLECTOMY      reports that she quit smoking about 32 years ago. Her smoking use included cigarettes. She has a 5.00 pack-year smoking history. She has never used smokeless tobacco. She reports that she does not drink alcohol or use drugs. family history includes Congestive Heart Failure in her mother; Parkinson's disease in her father; Stroke in her father. Allergies  Allergen Reactions  . Lipitor [Atorvastatin] Other (See Comments)    Memory issues  . Requip [Ropinirole Hcl] Other (See Comments)    Pt reports feeling generally unwell on this medication   Current Outpatient Medications on File Prior to Visit  Medication Sig Dispense Refill  . albuterol (VENTOLIN HFA) 108 (90 Base) MCG/ACT inhaler Inhale 1-2 puffs into the lungs every 6 (six) hours as needed for wheezing or shortness of breath. 1 Inhaler 11  . etodolac (LODINE) 300 MG capsule Take 1 capsule (300 mg total) by mouth 2 (two) times daily. 180 capsule 1  . fenofibrate micronized (LOFIBRA) 134 MG capsule Take 1 capsule (134 mg total) by mouth daily before breakfast.  90 capsule 3  . furosemide (LASIX) 40 MG tablet 1 tab by mouth in the AM, and 1 tab by mouth in the PM as needed for persistent swelling or weight gain more than 3-5 lbs 60 tablet 11  . losartan (COZAAR) 100 MG tablet Take 0.5 tablets (50 mg total) by mouth daily. 90 tablet 3  . metoprolol succinate (TOPROL-XL) 50 MG 24 hr tablet Take 1 tablet (50 mg total) by mouth daily. Take with or immediately following a meal. 90 tablet 3  . triamcinolone cream (KENALOG) 0.5 % Apply 1 application topically 2 (two) times daily. 30 g 1  . venlafaxine XR (EFFEXOR XR) 75 MG 24 hr  capsule Take 3 capsules (225 mg total) by mouth daily with breakfast. 270 capsule 3   No current facility-administered medications on file prior to visit.    Observations/Objective: Alert, NAD, appropriate mood and affect, resps normal, cn 2-12 intact, moves all 4s, no visible rash or swelling Lab Results  Component Value Date   WBC 10.3 11/16/2018   HGB 12.2 11/16/2018   HCT 35.8 (L) 11/16/2018   PLT 237.0 11/16/2018   GLUCOSE 91 11/16/2018   CHOL 210 (H) 11/16/2018   TRIG 110.0 11/16/2018   HDL 73.70 11/16/2018   LDLCALC 115 (H) 11/16/2018   ALT 8 11/16/2018   AST 15 11/16/2018   NA 138 11/16/2018   K 3.8 11/16/2018   CL 95 (L) 11/16/2018   CREATININE 0.86 11/16/2018   BUN 12 11/16/2018   CO2 32 11/16/2018   TSH 3.38 11/16/2018   HGBA1C 6.2 11/16/2018   Assessment and Plan: See notes  Follow Up Instructions: See notes   I discussed the assessment and treatment plan with the patient. The patient was provided an opportunity to ask questions and all were answered. The patient agreed with the plan and demonstrated an understanding of the instructions.   The patient was advised to call back or seek an in-person evaluation if the symptoms worsen or if the condition fails to improve as anticipated.    Cathlean Cower, MD

## 2018-12-04 NOTE — Patient Instructions (Signed)
Please take all new medication as prescribed - the Vitamin D and the B12 shots  Please continue all other medications as before, and refills have been done if requested.  Please have the pharmacy call with any other refills you may need.  Please continue your efforts at being more active, low cholesterol diet, and weight control.  Please keep your appointments with your specialists as you may have planned  Please return in 6 months, or sooner if needed

## 2018-12-04 NOTE — Assessment & Plan Note (Signed)
Son EMT to give shots at home monthly asd,  to f/u any worsening symptoms or concerns

## 2018-12-05 ENCOUNTER — Encounter (HOSPITAL_COMMUNITY): Payer: Self-pay

## 2018-12-07 ENCOUNTER — Encounter (HOSPITAL_COMMUNITY): Payer: Self-pay

## 2019-01-04 ENCOUNTER — Telehealth: Payer: Self-pay | Admitting: Pulmonary Disease

## 2019-01-04 DIAGNOSIS — R2681 Unsteadiness on feet: Secondary | ICD-10-CM

## 2019-01-04 NOTE — Telephone Encounter (Signed)
Called and spoke with pt's daughter, Butch Penny. She is requesting a walker with a seat. Pt uses [employer] currently. Pt last seen by Aaron Edelman in 09/2018 for ILD and has a follow up with Aaron Edelman in 01/2019.  Aaron Edelman, please advise if ok to send order for walker with a seat. Thanks.

## 2019-01-04 NOTE — Telephone Encounter (Signed)
Spoke with pt's daughter, Butch Penny. She is aware that Aaron Edelman is okay with making this order. Order has been placed. Nothing further was needed.

## 2019-01-04 NOTE — Telephone Encounter (Signed)
Ok to order.   Aaron Edelman

## 2019-01-18 ENCOUNTER — Telehealth: Payer: Self-pay | Admitting: Internal Medicine

## 2019-01-18 DIAGNOSIS — J9611 Chronic respiratory failure with hypoxia: Secondary | ICD-10-CM

## 2019-01-18 DIAGNOSIS — J849 Interstitial pulmonary disease, unspecified: Secondary | ICD-10-CM

## 2019-01-18 NOTE — Telephone Encounter (Signed)
Copied from [place] [id number]. Topic: Quick Communication - See Telephone Encounter >> Jan 18, 2019  3:19 PM Nils Flack wrote: CRM for notification. See Telephone encounter for: 01/18/19. Pt calling  she says she needs a duo walker (rolling walker with seat) also has adjustable handles and foot holders it can be used as walker and wheelchair She needs an order for this sent to [facility] is [phone]

## 2019-01-18 NOTE — Telephone Encounter (Signed)
Done hardcopy to shirron 

## 2019-01-19 NOTE — Telephone Encounter (Signed)
Order was given to me by PCP and has been faxed to [facility] at [phone]).

## 2019-02-05 NOTE — Progress Notes (Signed)
Reviewed and agree with assessment/plan.   Khyleigh Furney, MD [facility] 06/23/2016, 12:24 PM Pager:  [phone]  

## 2019-02-12 NOTE — Progress Notes (Signed)
@Patient  ID: Kathy [name], female    DOB: [birth year], 78 y.o.   MRN: [id number]  Chief Complaint  Patient presents with  . Follow-up    She reports her breathing has been at her baseline. She reports with the heat she is a little more SOB but no more than expected.     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  Quit 1988.  5-pack-year smoking history. Maintenance: None Pt of: Dr. Halford Chessman  02/13/2019  - Visit   78 year old female former smoker pulmonary office for ILD and chronic respiratory failure.  Patient presenting today as a four-month follow-up.  Patient feels that her breathing is at baseline.  Patient has no acute complaints at this time.  Patient has a rescue inhaler at home but she does not have to utilize this often.  Patient is frustrated today because she is frustrated regarding SARS-CoV-2 pandemic.  Patient reports this is only her fifth time leaving the house since March/2020.  She is very frustrated with having to wear a mask today. Patient also reports that her husband smokes in the car with her she is wondering if we can write a documented note stating that he should not smoke in the car with her.  Patient reports that she has a baseline productive cough with gray mucus.  Patient patient with known bronchiectasis on CT.  Patient does not have a flutter valve.   Tests:   Pulmonary tests:  Serology 03/01/17 >> ANA negative, RF < 14, CCP < 16, ANCA negative, SSA/SSB < 0.2 PFT 03/15/17 >> FEV1 1.40 (82%), FEV1% 96, TLC 2.86 (64%), DLCO 70% HP panel 03/15/17 >> negative Aspergillus IgE Panel 07/20/17 >> negative IgE 07/20/17 >> 44 PFT 03/14/18 >> FEV1 1.28 (76%), FEV1% 91, DLCO 59%  Chest imaging:  HRCT chest 02/22/17 >>  atherosclerosis, 3 mm LUL nodule, patchy air trapping b/l, patchy reticulation and GGO b/l, minimal traction BTX HRCT chest 01/12/18 >> 3 mm nodule Lt apex, patchy air trapping, patchy subpleural reticulation and GGO, mild traction BTX  Cardiac tests:  Echo 03/21/17 >> EF 65 to 70%, grade 1 DD  FENO:  No results found for: NITRICOXIDE  PFT: PFT Results Latest Ref Rng & Units 03/14/2018 03/15/2017  FVC-Pre L 1.40 1.47  FVC-Predicted Pre % 62 64  FVC-Post L 1.35 1.45  FVC-Predicted Post % 60 63  Pre FEV1/FVC % % 91 93  Post FEV1/FCV % % 94 96  FEV1-Pre L 1.28 1.37  FEV1-Predicted Pre % 76 80  FEV1-Post L 1.27 1.40  DLCO UNC% % 59 70  DLCO COR %Predicted % 119 121  TLC L - 2.86  TLC % Predicted % - 64  RV % Predicted % - 54    Imaging: No results found.    Specialty Problems      Pulmonary Problems   Community acquired pneumonia   Sleep apnea    has CPAP, doesn't use      Influenza with pneumonia   Chronic respiratory failure with hypoxia (HCC)   ILD (interstitial lung disease) (HCC)   Cough   Dyspnea on exertion   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 (HCC)      Allergies  Allergen Reactions  . Lipitor [Atorvastatin] Other (See Comments)    Memory issues  . Requip [Ropinirole Hcl]  Other (See Comments)    Pt reports feeling generally unwell on this medication    Immunization History  Administered Date(s) Administered  . Influenza, High Dose Seasonal PF 04/04/2017, 03/14/2018  . Pneumococcal Conjugate-13 11/30/2017  . Pneumococcal Polysaccharide-23 06/30/2016  . Tdap 11/30/2017   Flu vaccine in Fall/2020  Past Medical History:  Diagnosis Date  . Arthritis    fingers  . CKD (chronic kidney disease) stage 3, GFR 30-59 ml/min (HCC) 11/30/2017  . Depression   . Dizziness    in AM, getting out of bed  . Dysrhythmia    "skips a beat" sometimes - followed by PCP  . Endometrial ca ([place]) 11/30/2017   S/p surgury 1990's  . GERD (gastroesophageal reflux disease)  11/30/2017  . HLD (hyperlipidemia) 11/30/2017  . Hypercholesteremia   . Hypertension   . Neuropathy    bilateral feet  . Shortness of breath dyspnea   . Sleep apnea    has CPAP, doesn't use  . Umbilical hernia     Tobacco History: Social History   Tobacco Use  Smoking Status Former Smoker  . Packs/day: 1.00  . Years: 5.00  . Pack years: 5.00  . Types: Cigarettes  . Quit date: 06/28/1986  . Years since quitting: 32.6  Smokeless Tobacco Never Used  Tobacco Comment   quit 40+ yrs ago, 1 PPD for a few years   Counseling given: Not Answered Comment: quit 40+ yrs ago, 1 PPD for a few years   Continue to not smoke  Outpatient Encounter Medications as of 02/13/2019  Medication Sig  . albuterol (VENTOLIN HFA) 108 (90 Base) MCG/ACT inhaler Inhale 1-2 puffs into the lungs every 6 (six) hours as needed for wheezing or shortness of breath.  . cyanocobalamin (,VITAMIN B-12,) 1000 MCG/ML injection Inject 1 mL (1,000 mcg total) into the muscle every 30 (thirty) days.  Marland Kitchen etodolac (LODINE) 300 MG capsule Take 1 capsule (300 mg total) by mouth 2 (two) times daily.  . fenofibrate micronized (LOFIBRA) 134 MG capsule Take 1 capsule (134 mg total) by mouth daily before breakfast.  . furosemide (LASIX) 40 MG tablet 1 tab by mouth in the AM, and 1 tab by mouth in the PM as needed for persistent swelling or weight gain more than 3-5 lbs  . losartan (COZAAR) 100 MG tablet Take 0.5 tablets (50 mg total) by mouth daily.  . metoprolol succinate (TOPROL-XL) 50 MG 24 hr tablet Take 1 tablet (50 mg total) by mouth daily. Take with or immediately following a meal.  . triamcinolone cream (KENALOG) 0.5 % Apply 1 application topically 2 (two) times daily.  Marland Kitchen venlafaxine XR (EFFEXOR XR) 75 MG 24 hr capsule Take 3 capsules (225 mg total) by mouth daily with breakfast.  . Vitamin D, Ergocalciferol, (DRISDOL) 1.25 MG (50000 UT) CAPS capsule Take 1 capsule (50,000 Units total) by mouth every 7 (seven) days.  Marland Kitchen  Respiratory Therapy Supplies (FLUTTER) DEVI Use as directed   No facility-administered encounter medications on file as of 02/13/2019.      Review of Systems  Review of Systems  Constitutional: Positive for fatigue. Negative for activity change and fever.  HENT: Negative for sinus pressure, sinus pain and sore throat.   Respiratory: Positive for cough (prod - heavy grey ), shortness of breath and wheezing.   Cardiovascular: Negative for chest pain and palpitations.  Gastrointestinal: Negative for diarrhea, nausea and vomiting.  Musculoskeletal: Negative for arthralgias.  Neurological: Negative for dizziness.  Psychiatric/Behavioral: Negative for sleep disturbance. The patient  is not nervous/anxious.      Physical Exam  BP 118/84   Pulse 64   Temp (!) 97.4 F (36.3 C) (Temporal)   Ht 5' (1.524 m)   Wt 242 lb 6.4 oz (110 kg)   SpO2 98%   BMI 47.34 kg/m   Wt Readings from Last 5 Encounters:  02/13/19 242 lb 6.4 oz (110 kg)  11/16/18 250 lb (113.4 kg)  08/09/18 258 lb (117 kg)  07/13/18 267 lb (121.1 kg)  06/02/18 250 lb (113.4 kg)    Physical Exam Vitals signs and nursing note reviewed.  Constitutional:      General: She is not in acute distress.    Appearance: Normal appearance. She is obese.  HENT:     Head: Normocephalic and atraumatic.     Right Ear: Tympanic membrane, ear canal and external ear normal. There is no impacted cerumen.     Left Ear: Tympanic membrane, ear canal and external ear normal. There is no impacted cerumen.     Nose: Nose normal. No congestion or rhinorrhea.     Comments: Nasal cannula applied    Mouth/Throat:     Mouth: Mucous membranes are dry.     Pharynx: Oropharynx is clear.  Eyes:     Pupils: Pupils are equal, round, and reactive to light.  Neck:     Musculoskeletal: Normal range of motion.  Cardiovascular:     Rate and Rhythm: Normal rate and regular rhythm.     Pulses: Normal pulses.     Heart sounds: Normal heart sounds. No  murmur.  Pulmonary:     Effort: Pulmonary effort is normal. No respiratory distress.     Breath sounds: Normal breath sounds. No decreased air movement. No decreased breath sounds, wheezing or rales.     Comments: Distant breath sounds throughout entire exam likely due to body habitus Abdominal:     General: Abdomen is flat. Bowel sounds are normal.     Palpations: Abdomen is soft.     Comments: Obese  Musculoskeletal:     Right lower leg: Edema (2+ lower extremity swelling) present.     Left lower leg: Edema (2+ lower extremity swelling) present.  Skin:    General: Skin is warm and dry.     Capillary Refill: Capillary refill takes less than 2 seconds.  Neurological:     General: No focal deficit present.     Mental Status: She is alert and oriented to person, place, and time. Mental status is at baseline.     Gait: Gait abnormal.     Comments: In wheelchair today  Psychiatric:        Mood and Affect: Affect is flat.        Behavior: Behavior normal.        Thought Content: Thought content normal.        Judgment: Judgment normal.     Comments: Frustrated with SARS-CoV-2 pandemic     Lab Results:  CBC    Component Value Date/Time   WBC 10.3 11/16/2018 1455   RBC 3.91 11/16/2018 1455   HGB 12.2 11/16/2018 1455   HCT 35.8 (L) 11/16/2018 1455   PLT 237.0 11/16/2018 1455   MCV 91.7 11/16/2018 1455   MCH 30.4 05/23/2018 1205   MCHC 34.1 11/16/2018 1455   RDW 13.6 11/16/2018 1455   LYMPHSABS 2.8 11/16/2018 1455   MONOABS 0.8 11/16/2018 1455   EOSABS 0.1 11/16/2018 1455   BASOSABS 0.1 11/16/2018 1455    BMET  Component Value Date/Time   NA 138 11/16/2018 1455   K 3.8 11/16/2018 1455   CL 95 (L) 11/16/2018 1455   CO2 32 11/16/2018 1455   GLUCOSE 91 11/16/2018 1455   BUN 12 11/16/2018 1455   CREATININE 0.86 11/16/2018 1455   CALCIUM 9.2 11/16/2018 1455   GFRNONAA 38 (L) 05/23/2018 1205   GFRAA 44 (L) 05/23/2018 1205    BNP    Component Value Date/Time   BNP  115.1 (H) 02/13/2017 1825    ProBNP    Component Value Date/Time   PROBNP 85.0 04/14/2018 1110      Assessment & Plan:   Chronic respiratory failure with hypoxia (HCC) Plan: Continue oxygen therapy as prescribed Continue to increase daily physical activity as able Follow-up with Dr. Halford Chessman in 4 months  ILD (interstitial lung disease) ([place]) Plan: Continue oxygen therapy as prescribed Continue to increase daily physical activity as able Continue to remain off of prednisone Rescue inhaler as needed Lab work today, may need to consider repeat echocardiogram if labs are elevated Follow-up with Dr. Halford Chessman in 4 months  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 ([place]) Plan: Start flutter valve use, 2 times daily 10 breaths each time  Dyspnea on exertion Plan: Lab work today May need to consider repeat echocardiogram if proBNP is elevated  Leg swelling Plan: Lab work today   Physical deconditioning Plan: Work on increasing daily physical activity    Return in about 3 months (around 05/16/2019), or if symptoms worsen or fail to improve, for Follow up with Dr. Halford Chessman.   Lauraine Rinne, NP 02/13/2019   This appointment was 32 minutes long with over 50% of the time in direct face-to-face patient care, assessment, plan of care, and follow-up.

## 2019-02-13 ENCOUNTER — Ambulatory Visit (INDEPENDENT_AMBULATORY_CARE_PROVIDER_SITE_OTHER): Payer: Medicare Other | Admitting: Pulmonary Disease

## 2019-02-13 ENCOUNTER — Encounter: Payer: Self-pay | Admitting: Pulmonary Disease

## 2019-02-13 ENCOUNTER — Other Ambulatory Visit: Payer: Self-pay

## 2019-02-13 VITALS — BP 118/84 | HR 64 | Temp 97.4°F | Ht 60.0 in | Wt 242.4 lb

## 2019-02-13 DIAGNOSIS — R0609 Other forms of dyspnea: Secondary | ICD-10-CM

## 2019-02-13 DIAGNOSIS — R5381 Other malaise: Secondary | ICD-10-CM

## 2019-02-13 DIAGNOSIS — J9611 Chronic respiratory failure with hypoxia: Secondary | ICD-10-CM

## 2019-02-13 DIAGNOSIS — J479 Bronchiectasis, uncomplicated: Secondary | ICD-10-CM

## 2019-02-13 DIAGNOSIS — J849 Interstitial pulmonary disease, unspecified: Secondary | ICD-10-CM

## 2019-02-13 DIAGNOSIS — M7989 Other specified soft tissue disorders: Secondary | ICD-10-CM | POA: Diagnosis not present

## 2019-02-13 LAB — PRO B NATRIURETIC PEPTIDE: NT-Pro BNP: 303 pg/mL (ref 0–738)

## 2019-02-13 MED ORDER — FLUTTER DEVI: 0 refills | Status: DC

## 2019-02-13 NOTE — Assessment & Plan Note (Signed)
Plan: Work on increasing daily physical activity

## 2019-02-13 NOTE — Assessment & Plan Note (Signed)
Plan: Lab work today May need to consider repeat echocardiogram if proBNP is elevated

## 2019-02-13 NOTE — Assessment & Plan Note (Addendum)
Plan: Continue oxygen therapy as prescribed Continue to increase daily physical activity as able Follow-up with Dr. Halford Chessman in 4 months

## 2019-02-13 NOTE — Assessment & Plan Note (Signed)
Plan: Start flutter valve use, 2 times daily 10 breaths each time

## 2019-02-13 NOTE — Assessment & Plan Note (Addendum)
Plan: Continue oxygen therapy as prescribed Continue to increase daily physical activity as able Continue to remain off of prednisone Rescue inhaler as needed Lab work today, may need to consider repeat echocardiogram if labs are elevated Follow-up with Dr. Halford Chessman in 4 months

## 2019-02-13 NOTE — Assessment & Plan Note (Signed)
Plan: Lab work today 

## 2019-02-13 NOTE — Patient Instructions (Addendum)
You were seen today by Lauraine Rinne, NP  for:   1. Chronic respiratory failure with hypoxia (HCC)  Continue oxygen therapy as prescribed  >>>maintain oxygen saturations greater than 88 percent  >>>if unable to maintain oxygen saturations please contact the office  >>>do not smoke with oxygen  >>>can use nasal saline gel or nasal saline rinses to moisturize nose if oxygen causes dryness  Increase daily physical activity  2. ILD (interstitial lung disease) ([place])  Continue to increase her daily physical activity Monitor oxygen saturations Follow-up with Dr. Halford Chessman in 3 months  Continue to avoid tobacco smoke, this includes being in a car with people who are smoking  3. Leg swelling  - Pro b natriuretic peptide (BNP); Future - Comp Met (CMET); Future  Continue lasix as prescribed   Weigh yourself daily  Follow low sodium diet  4. Dyspnea on exertion  - Pro b natriuretic peptide (BNP); Future - Comp Met (CMET); Future  5. 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 (HCC)  Bronchiectasis: This is the medical term which indicates that you have damage, dilated airways making you more susceptible to respiratory infection. Use a flutter valve 10 breaths twice a day or 4 to 5 breaths 4-5 times a day to help clear mucus out Let us know if you have cough with change in mucus color or fevers or chills.  At that point you would need an antibiotic. Maintain a healthy nutritious diet, eating whole foods Take your medications as prescribed     We recommend today:  Orders Placed This Encounter  Procedures  . Pro b natriuretic peptide (BNP)    Standing Status:   Future    Standing Expiration Date:   02/13/2020  . Comp Met (CMET)    Standing Status:   Future    Standing Expiration Date:   02/13/2020   Orders Placed This Encounter  Procedures  . Pro b natriuretic peptide (BNP)  . Comp Met (CMET)   No orders of the defined types were placed in this encounter.   Follow Up:    Return in  about 3 months (around 05/16/2019), or if symptoms worsen or fail to improve, for Follow up with Dr. Halford Chessman.   Please do your part to reduce the spread of COVID-19:      Reduce your risk of any infection  and COVID19 by using the similar precautions used for avoiding the common cold or flu:  Marland Kitchen Wash your hands often with soap and warm water for at least 20 seconds.  If soap and water are not readily available, use an alcohol-based hand sanitizer with at least 60% alcohol.  . If coughing or sneezing, cover your mouth and nose by coughing or sneezing into the elbow areas of your shirt or coat, into a tissue or into your sleeve (not your hands). Langley Gauss A MASK when in public  . Avoid shaking hands with others and consider head nods or verbal greetings only. . Avoid touching your eyes, nose, or mouth with unwashed hands.  . Avoid close contact with people who are sick. . Avoid places or events with large numbers of people in one location, like concerts or sporting events. . If you have some symptoms but not all symptoms, continue to monitor at home and seek medical attention if your symptoms worsen. . If you are having a medical emergency, call 911.   ADDITIONAL HEALTHCARE OPTIONS FOR PATIENTS  [facility] / e-Visit: [email or web address]  [facility]: [facility]: [phone]                   [facility]: [phone]     It is flu season:   >>> Best ways to protect herself from the flu: Receive the yearly flu vaccine, practice good hand hygiene washing with soap and also using hand sanitizer when available, eat a nutritious meals, get adequate rest, hydrate appropriately   Please contact the office if your symptoms worsen or you have concerns that you are not improving.   Thank you for choosing [facility] for your healthcare, and for allowing [place] to partner with  you on your healthcare journey. I am thankful to be able to provide care to you today.   Wyn Quaker FNP-C

## 2019-02-14 LAB — COMPREHENSIVE METABOLIC PANEL
ALT: 14 U/L (ref 0–35)
AST: 22 U/L (ref 0–37)
Albumin: 3.8 g/dL (ref 3.5–5.2)
Alkaline Phosphatase: 51 U/L (ref 39–117)
BUN: 24 mg/dL — ABNORMAL HIGH (ref 6–23)
CO2: 30 mEq/L (ref 19–32)
Calcium: 8.8 mg/dL (ref 8.4–10.5)
Chloride: 99 mEq/L (ref 96–112)
Creatinine, Ser: 1.26 mg/dL — ABNORMAL HIGH (ref 0.40–1.20)
GFR: 41.05 mL/min — ABNORMAL LOW (ref >60.00)
Glucose, Bld: 95 mg/dL (ref 70–99)
Potassium: 3.8 mEq/L (ref 3.5–5.1)
Sodium: 137 mEq/L (ref 135–145)
Total Bilirubin: 0.5 mg/dL (ref 0.2–1.2)
Total Protein: 7.2 g/dL (ref 6.0–8.3)

## 2019-02-14 NOTE — Progress Notes (Signed)
Kidney functioning worsening. Pt needs to follow up with primary care.   No significant fluid overload.   No new recs or changes in plan of care.   Wyn Quaker FNP

## 2019-02-18 NOTE — Progress Notes (Signed)
Reviewed and agree with assessment/plan.   Lucine Bilski, MD [facility] 06/23/2016, 12:24 PM Pager:  [phone]  

## 2019-03-25 IMAGING — CT CT ABD-PELV W/ CM
2 of 5 series · 15 of 46 positions shown, 17 images · IV contrast (omnipaque)
Comparison: Prior CT from 08/01/2014.

CLINICAL DATA: Initial evaluation for acute abdominal pain, hernia
suspected.

EXAM:
CT ABDOMEN AND PELVIS WITH CONTRAST
TECHNIQUE: Multidetector CT imaging of the abdomen and pelvis was performed
using the standard protocol following bolus administration of
intravenous contrast.
CONTRAST:  100mL OMNIPAQUE IOHEXOL 300 MG/ML  SOLN

[Series 3: abdomen 5.0 · axial · 0.98mm/px · z∈[+770,+1195]mm · 12 of 99 slices shown, 14 images]
[im 7/99  soft-tissue]
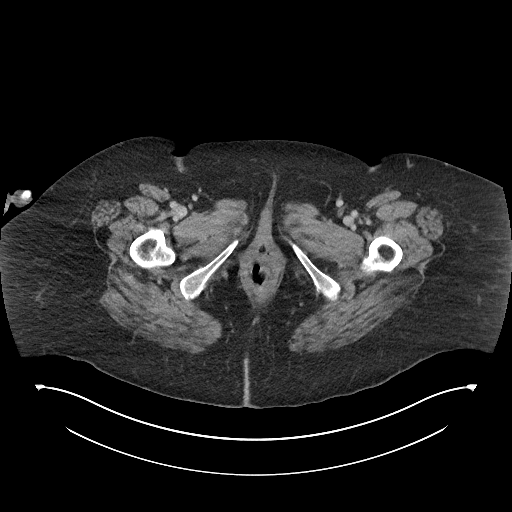
[im 7/99  bone]
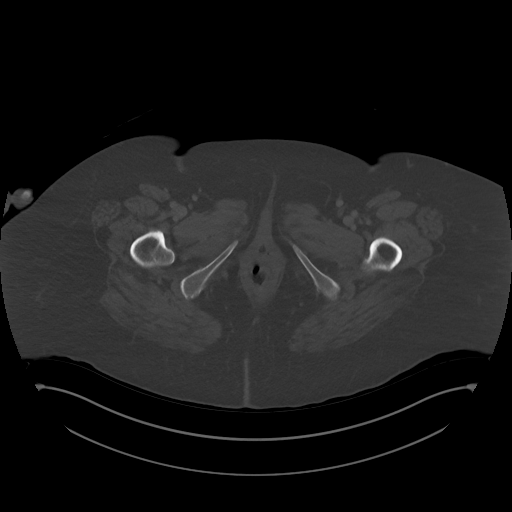
[im 14/99  soft-tissue]
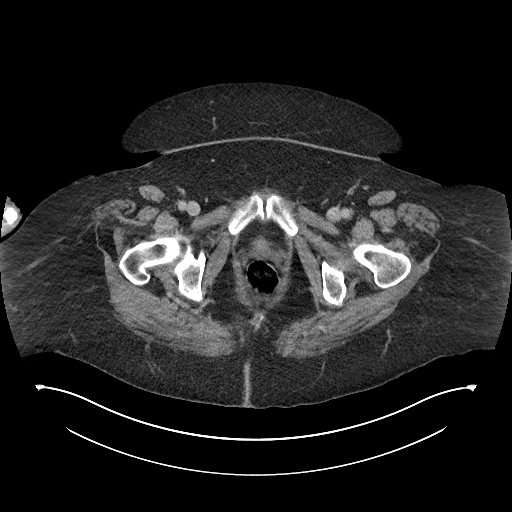
[im 20/99  soft-tissue]
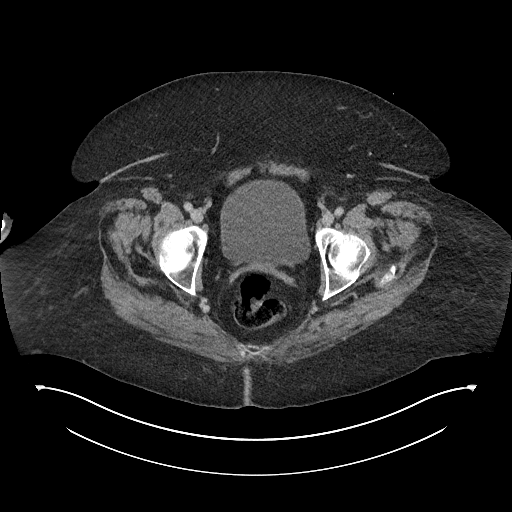
[im 33/99  soft-tissue]
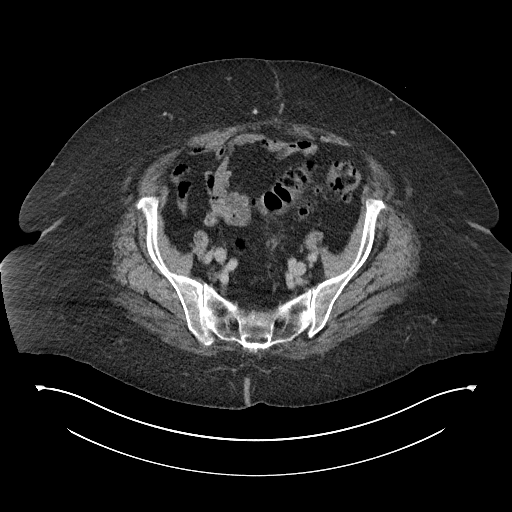
[im 40/99  soft-tissue]
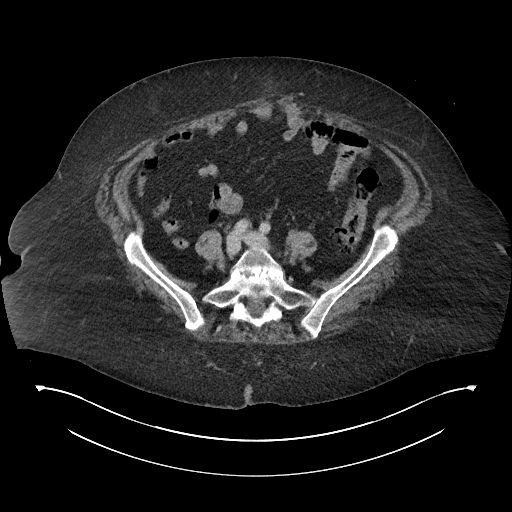
[im 46/99  soft-tissue]
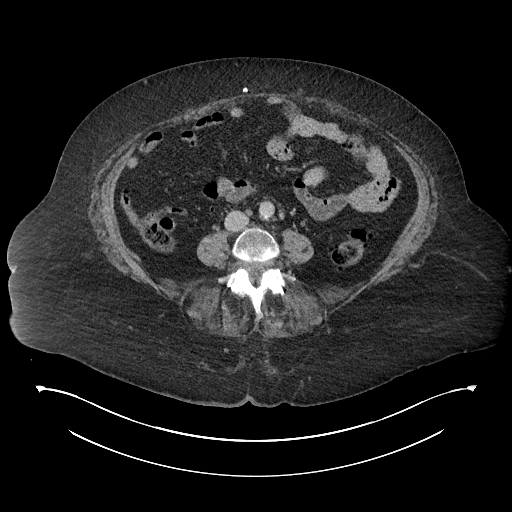
[im 53/99  soft-tissue]
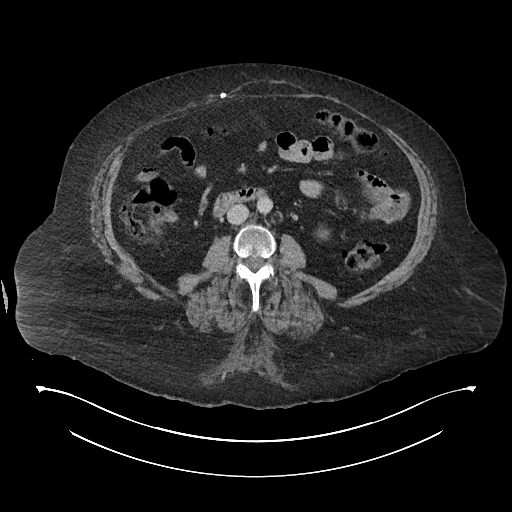
[im 59/99  soft-tissue]
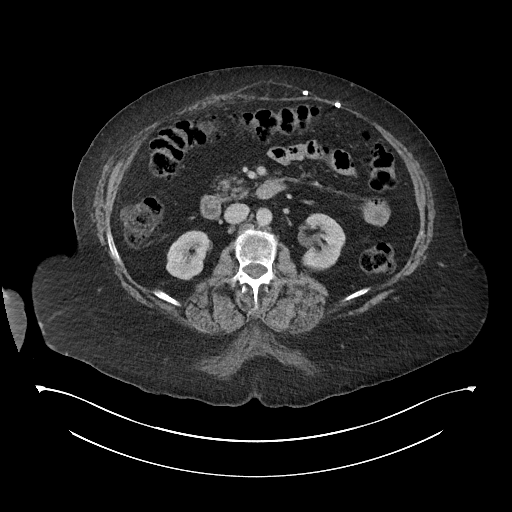
[im 66/99  soft-tissue]
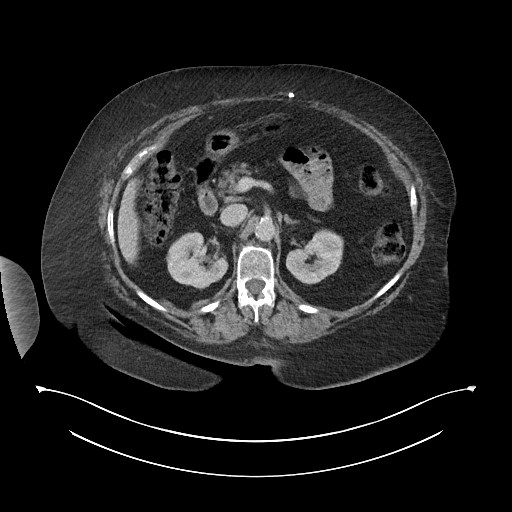
[im 66/99  bone]
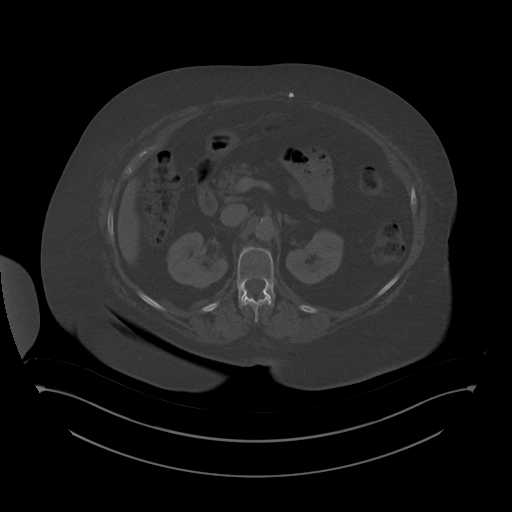
[im 79/99  soft-tissue]
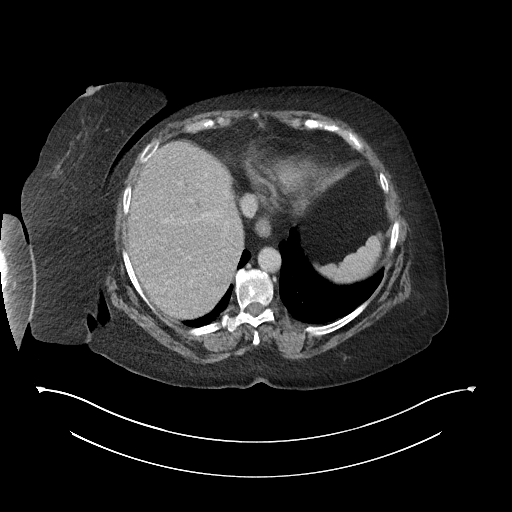
[im 85/99  soft-tissue]
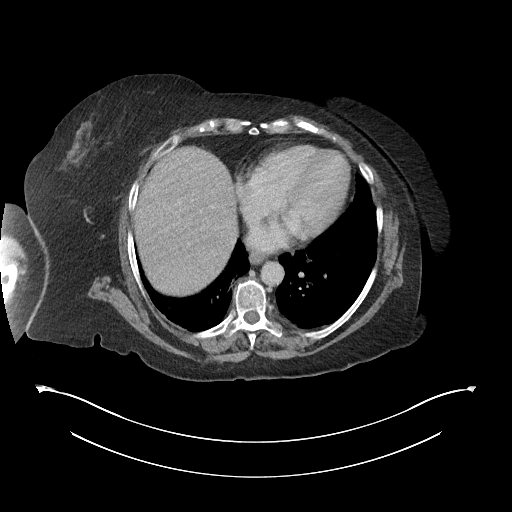
[im 92/99  soft-tissue]
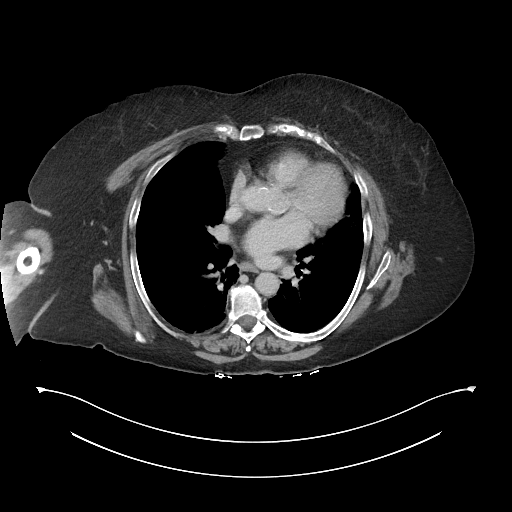

[Series 6: abdomen 3.0 mpr cor · coronal · 0.97mm/px · 3 of 116 slices shown]
[im 39/116  soft-tissue]
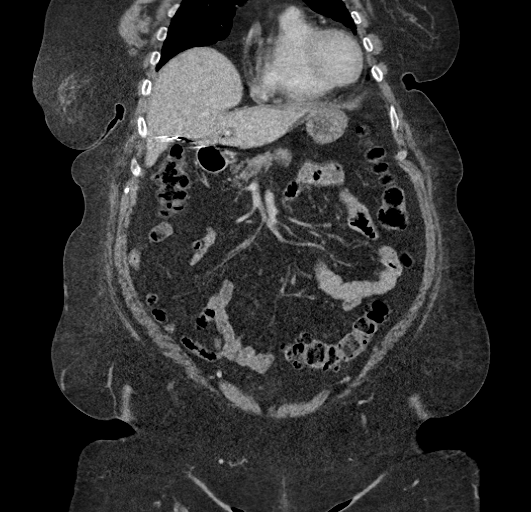
[im 52/116  soft-tissue]
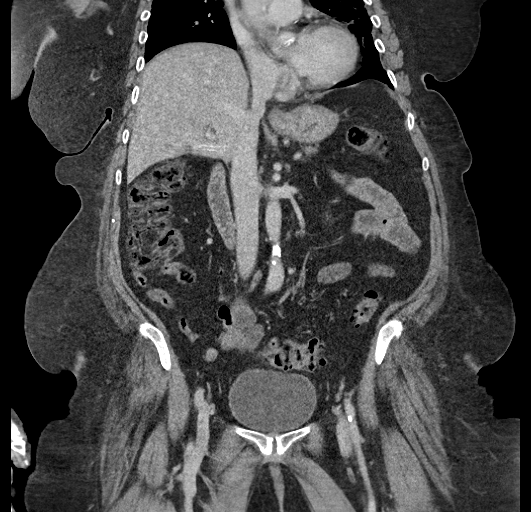
[im 64/116  soft-tissue]
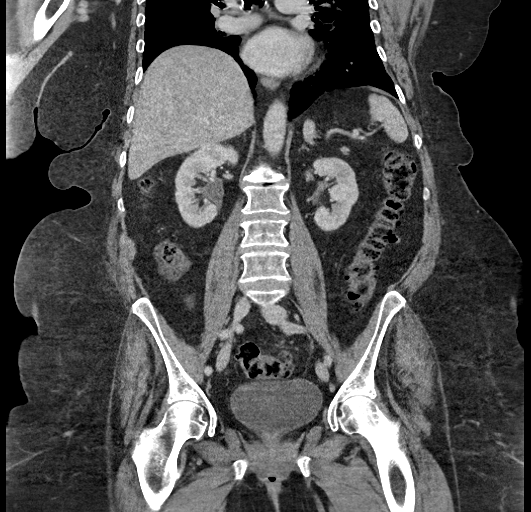

[15 of 46 positions shown; findings below may reference images not displayed]

FINDINGS: Lower chest: Mosaic attenuation seen involving the bilateral lung
bases, which could reflect multifocal air trapping and/or edema,
relatively similar to previous. Superimposed scattered bibasilar
atelectasis and/or scarring. Prominent coronary artery
calcifications noted within the LAD.

Hepatobiliary: Liver demonstrates a normal contrast enhanced
appearance. Gallbladder surgically absent. Mild intrahepatic biliary
dilatation likely related to post cholecystectomy changes.

Pancreas: Mild diffuse fatty infiltration of the pancreas noted.
Pancreas otherwise unremarkable without acute inflammatory changes.

Spleen: Subcentimeter hypodensity within the central aspect of the
spleen noted, too small the characterize, but of doubtful
significance. Spleen otherwise unremarkable.

Adrenals/Urinary Tract: Adrenal glands within normal limits. Kidneys
equal size with symmetric enhancement. 17 mm right renal cyst noted.
No nephrolithiasis, hydronephrosis, or focal enhancing renal mass.
No hydroureter. Partially distended bladder within normal limits.

Stomach/Bowel: Stomach within normal limits. No evidence for bowel
obstruction. Normal appendix. Extensive colonic diverticulosis
without evidence for acute diverticulitis. No acute inflammatory
changes seen about the bowels.

Vascular/Lymphatic: Mild aorto bi-iliac atherosclerotic disease. No
aneurysm. Mesenteric vessels patent proximally. No adenopathy.

Reproductive: Uterus absent.  Ovaries not discretely identified.

Other: No free air or fluid sequelae of prior ventral hernia repair.
A small residual and/or recurrent fat containing ventral hernia
measuring 1.6 x 7.6 cm present at the ventral abdomen, relatively
similar to previous. Neck of the hernia measures 3.6 cm in diameter.
No significant associated inflammation. No herniated loops of bowel.
Additional smaller mildly complex fat containing ventral hernia
measuring 1.7 cm in diameter seen inferiorly (series 3, image 60).
Several loops of bowel closely approximate the base of this hernia
without frank bowel herniation. No significant inflammatory changes
about this hernia as well.

Musculoskeletal: No acute osseous abnormality. No discrete lytic or
blastic osseous lesions. Advanced facet arthropathy noted at L4-5
and L5-S1. Grade 1 retrolisthesis of L1 on L2 noted, likely chronic
and degenerative.
IMPRESSION: 1. Sequelae of prior ventral hernia repair with two small residual
and/or recurrent fat containing ventral hernias as detailed above.
No significant associated inflammation evident by CT. No herniated
loops of bowel or other complication identified.
2. No other acute intra-abdominal or pelvic process.
3. Extensive colonic diverticulosis without evidence for acute
diverticulitis.
4. Mild aorto bi-iliac atherosclerotic disease with prominent
coronary artery calcifications within the LAD.
5. Bibasilar atelectasis and/or scarring with superimposed mosaic
attenuation, similar to previous, and could reflect chronic air
trapping.

## 2019-04-23 ENCOUNTER — Other Ambulatory Visit: Payer: Self-pay

## 2019-04-23 DIAGNOSIS — Z20822 Contact with and (suspected) exposure to covid-19: Secondary | ICD-10-CM

## 2019-04-23 DIAGNOSIS — Z20828 Contact with and (suspected) exposure to other viral communicable diseases: Secondary | ICD-10-CM | POA: Diagnosis not present

## 2019-04-24 LAB — NOVEL CORONAVIRUS, NAA: SARS-CoV-2, NAA: NOT DETECTED

## 2019-06-08 ENCOUNTER — Other Ambulatory Visit (INDEPENDENT_AMBULATORY_CARE_PROVIDER_SITE_OTHER): Payer: Medicare Other

## 2019-06-08 ENCOUNTER — Encounter: Payer: Self-pay | Admitting: Internal Medicine

## 2019-06-08 ENCOUNTER — Other Ambulatory Visit: Payer: Self-pay

## 2019-06-08 ENCOUNTER — Ambulatory Visit (INDEPENDENT_AMBULATORY_CARE_PROVIDER_SITE_OTHER): Payer: Medicare Other | Admitting: Internal Medicine

## 2019-06-08 VITALS — BP 138/86 | HR 104 | Temp 98.2°F | Ht 60.0 in | Wt 242.0 lb

## 2019-06-08 DIAGNOSIS — E785 Hyperlipidemia, unspecified: Secondary | ICD-10-CM | POA: Diagnosis not present

## 2019-06-08 DIAGNOSIS — M25562 Pain in left knee: Secondary | ICD-10-CM

## 2019-06-08 DIAGNOSIS — E559 Vitamin D deficiency, unspecified: Secondary | ICD-10-CM

## 2019-06-08 DIAGNOSIS — N183 Chronic kidney disease, stage 3 unspecified: Secondary | ICD-10-CM

## 2019-06-08 DIAGNOSIS — R739 Hyperglycemia, unspecified: Secondary | ICD-10-CM | POA: Diagnosis not present

## 2019-06-08 DIAGNOSIS — I10 Essential (primary) hypertension: Secondary | ICD-10-CM

## 2019-06-08 DIAGNOSIS — E538 Deficiency of other specified B group vitamins: Secondary | ICD-10-CM

## 2019-06-08 DIAGNOSIS — M25561 Pain in right knee: Secondary | ICD-10-CM

## 2019-06-08 DIAGNOSIS — Z23 Encounter for immunization: Secondary | ICD-10-CM

## 2019-06-08 DIAGNOSIS — G8929 Other chronic pain: Secondary | ICD-10-CM

## 2019-06-08 LAB — HEPATIC FUNCTION PANEL
ALT: 15 U/L (ref 0–35)
AST: 21 U/L (ref 0–37)
Albumin: 3.9 g/dL (ref 3.5–5.2)
Alkaline Phosphatase: 54 U/L (ref 39–117)
Bilirubin, Direct: 0.2 mg/dL (ref 0.0–0.3)
Total Bilirubin: 0.8 mg/dL (ref 0.2–1.2)
Total Protein: 7.4 g/dL (ref 6.0–8.3)

## 2019-06-08 LAB — CBC WITH DIFFERENTIAL/PLATELET
Basophils Absolute: 0.1 10*3/uL (ref 0.0–0.1)
Basophils Relative: 0.7 % (ref 0.0–3.0)
Eosinophils Absolute: 0.1 10*3/uL (ref 0.0–0.7)
Eosinophils Relative: 1.5 % (ref 0.0–5.0)
HCT: 35.7 % — ABNORMAL LOW (ref 36.0–46.0)
Hemoglobin: 11.8 g/dL — ABNORMAL LOW (ref 12.0–15.0)
Lymphocytes Relative: 24.3 % (ref 12.0–46.0)
Lymphs Abs: 2.3 10*3/uL (ref 0.7–4.0)
MCHC: 33 g/dL (ref 30.0–36.0)
MCV: 94.9 fl (ref 78.0–100.0)
Monocytes Absolute: 0.7 10*3/uL (ref 0.1–1.0)
Monocytes Relative: 7.5 % (ref 3.0–12.0)
Neutro Abs: 6.1 10*3/uL (ref 1.4–7.7)
Neutrophils Relative %: 66 % (ref 43.0–77.0)
Platelets: 248 10*3/uL (ref 150.0–400.0)
RBC: 3.76 Mil/uL — ABNORMAL LOW (ref 3.87–5.11)
RDW: 12.8 % (ref 11.5–15.5)
WBC: 9.3 10*3/uL (ref 4.0–10.5)

## 2019-06-08 LAB — BASIC METABOLIC PANEL
BUN: 18 mg/dL (ref 6–23)
CO2: 30 mEq/L (ref 19–32)
Calcium: 9.2 mg/dL (ref 8.4–10.5)
Chloride: 100 mEq/L (ref 96–112)
Creatinine, Ser: 1.11 mg/dL (ref 0.40–1.20)
GFR: 47.48 mL/min — ABNORMAL LOW (ref >60.00)
Glucose, Bld: 124 mg/dL — ABNORMAL HIGH (ref 70–99)
Potassium: 3.6 mEq/L (ref 3.5–5.1)
Sodium: 140 mEq/L (ref 135–145)

## 2019-06-08 LAB — HEMOGLOBIN A1C: Hgb A1c MFr Bld: 5.6 % (ref 4.6–6.5)

## 2019-06-08 LAB — VITAMIN D 25 HYDROXY (VIT D DEFICIENCY, FRACTURES): VITD: 30.8 ng/mL (ref 30.00–100.00)

## 2019-06-08 LAB — VITAMIN B12: Vitamin B-12: 625 pg/mL (ref 211–911)

## 2019-06-08 MED ORDER — MELOXICAM 15 MG PO TABS: 15.0000 mg | ORAL_TABLET | Freq: Every day | ORAL | 3 refills | Status: DC

## 2019-06-08 MED ORDER — LOSARTAN POTASSIUM 100 MG PO TABS: 50.0000 mg | ORAL_TABLET | Freq: Every day | ORAL | 3 refills | Status: DC

## 2019-06-08 NOTE — Patient Instructions (Addendum)
You had the flu shot today  Ok to stop the etodolac (lodine)  Please take all new medication as prescribed - the mobic 15 mg per day as needed for knee pain  Please continue all other medications as before, and refills have been done if requested.  Please have the pharmacy call with any other refills you may need.  Please continue your efforts at being more active, low cholesterol diet, and weight control.   Please keep your appointments with your specialists as you may have planned  Please go to the LAB in the Basement (turn left off the elevator) for the tests to be done today  You will be contacted by phone if any changes need to be made immediately.  Otherwise, you will receive a letter about your results with an explanation, but please check with MyChart first.  Please remember to sign up for MyChart if you have not done so, as this will be important to you in the future with finding out test results, communicating by private email, and scheduling acute appointments online when needed.

## 2019-06-08 NOTE — Progress Notes (Signed)
Subjective:    Patient ID: Kathy [name], female    DOB: 05/01/1941, 78 y.o.   MRN: [id number]  HPI  Here to f/u; overall doing ok,  Pt denies chest pain, increasing sob or doe, wheezing, orthopnea, PND, increased LE swelling, palpitations, or syncope.  Pt denies new neurological symptoms such as new headache, or facial or extremity weakness or numbness.  Pt denies polydipsia, polyuria, or low sugar episode.  Pt states overall good compliance with meds, mostly trying to follow appropriate diet, with wt overall stable,  but little exercise however   Had had some dizziness with lightheaded with walking, fears passing out and falling, but better today.Alo with mild to mod worsening bialteral knee arthritic pain Wt Readings from Last 3 Encounters:  06/08/19 242 lb (109.8 kg)  02/13/19 242 lb 6.4 oz (110 kg)  11/16/18 250 lb (113.4 kg)   Past Medical History:  Diagnosis Date  . Arthritis    fingers  . CKD (chronic kidney disease) stage 3, GFR 30-59 ml/min 11/30/2017  . Depression   . Dizziness    in AM, getting out of bed  . Dysrhythmia    "skips a beat" sometimes - followed by PCP  . Endometrial ca ([place]) 11/30/2017   S/p surgury 1990's  . GERD (gastroesophageal reflux disease) 11/30/2017  . HLD (hyperlipidemia) 11/30/2017  . Hypercholesteremia   . Hypertension   . Neuropathy    bilateral feet  . Shortness of breath dyspnea   . Sleep apnea    has CPAP, doesn't use  . Umbilical hernia    Past Surgical History:  Procedure Laterality Date  . ABDOMINAL HYSTERECTOMY    . BROW LIFT Bilateral 07/15/2015   Procedure: BLEPHAROPLASTY;  Surgeon: Karle Starch, MD;  Location: [place];  Service: Ophthalmology;  Laterality: Bilateral;  . CHOLECYSTECTOMY    . HAMMER TOE SURGERY    . HERNIA REPAIR    . KNEE ARTHROSCOPY Bilateral   . PTOSIS REPAIR Bilateral 07/15/2015   Procedure: PTOSIS REPAIR;  Surgeon: Karle Starch, MD;  Location: [facility];  Service: Ophthalmology;  Laterality:  Bilateral;  CPAP  . TONSILLECTOMY      reports that she quit smoking about 32 years ago. Her smoking use included cigarettes. She has a 5.00 pack-year smoking history. She has never used smokeless tobacco. She reports that she does not drink alcohol or use drugs. family history includes Congestive Heart Failure in her mother; Parkinson's disease in her father; Stroke in her father. Allergies  Allergen Reactions  . Lipitor [Atorvastatin] Other (See Comments)    Memory issues  . Requip [Ropinirole Hcl] Other (See Comments)    Pt reports feeling generally unwell on this medication   Current Outpatient Medications on File Prior to Visit  Medication Sig Dispense Refill  . albuterol (VENTOLIN HFA) 108 (90 Base) MCG/ACT inhaler Inhale 1-2 puffs into the lungs every 6 (six) hours as needed for wheezing or shortness of breath. 1 Inhaler 11  . cyanocobalamin (,VITAMIN B-12,) 1000 MCG/ML injection Inject 1 mL (1,000 mcg total) into the muscle every 30 (thirty) days. 3 mL 3  . fenofibrate micronized (LOFIBRA) 134 MG capsule Take 1 capsule (134 mg total) by mouth daily before breakfast. 90 capsule 3  . furosemide (LASIX) 40 MG tablet 1 tab by mouth in the AM, and 1 tab by mouth in the PM as needed for persistent swelling or weight gain more than 3-5 lbs 60 tablet 11  . metoprolol succinate (TOPROL-XL) 50  MG 24 hr tablet Take 1 tablet (50 mg total) by mouth daily. Take with or immediately following a meal. 90 tablet 3  . Respiratory Therapy Supplies (FLUTTER) DEVI Use as directed 1 each 0  . triamcinolone cream (KENALOG) 0.5 % Apply 1 application topically 2 (two) times daily. 30 g 1  . venlafaxine XR (EFFEXOR XR) 75 MG 24 hr capsule Take 3 capsules (225 mg total) by mouth daily with breakfast. 270 capsule 3  . Vitamin D, Ergocalciferol, (DRISDOL) 1.25 MG (50000 UT) CAPS capsule Take 1 capsule (50,000 Units total) by mouth every 7 (seven) days. 12 capsule 0   No current facility-administered medications on  file prior to visit.   Review of Systems  Constitutional: Negative for other unusual diaphoresis or sweats HENT: Negative for ear discharge or swelling Eyes: Negative for other worsening visual disturbances Respiratory: Negative for stridor or other swelling  Gastrointestinal: Negative for worsening distension or other blood Genitourinary: Negative for retention or other urinary change Musculoskeletal: Negative for other MSK pain or swelling Skin: Negative for color change or other new lesions Neurological: Negative for worsening tremors and other numbness  Psychiatric/Behavioral: Negative for worsening agitation or other fatigue All otherwise neg per pt     Objective:   Physical Exam BP 138/86   Pulse (!) 104   Temp 98.2 F (36.8 C) (Oral)   Ht 5' (1.524 m)   Wt 242 lb (109.8 kg)   SpO2 93%   BMI 47.26 kg/m  VS noted, on home o2 2L  Constitutional: Pt appears in NAD HENT: Head: NCAT.  Right Ear: External ear normal.  Left Ear: External ear normal.  Eyes: . Pupils are equal, round, and reactive to light. Conjunctivae and EOM are normal Nose: without d/c or deformity Neck: Neck supple. Gross normal ROM Cardiovascular: Normal rate and regular rhythm.   Pulmonary/Chest: Effort normal and breath sounds without rales or wheezing.  Abd:  Soft, NT, ND, + BS, no organomegaly Bilateral knees with degenerative changes Neurological: Pt is alert. At baseline orientation, motor grossly intact Skin: Skin is warm. No rashes, other new lesions, trace bilat LE edema Psychiatric: Pt behavior is normal without agitation  All otherwise neg per pt  Lab Results  Component Value Date   WBC 10.3 11/16/2018   HGB 12.2 11/16/2018   HCT 35.8 (L) 11/16/2018   PLT 237.0 11/16/2018   GLUCOSE 95 02/13/2019   CHOL 210 (H) 11/16/2018   TRIG 110.0 11/16/2018   HDL 73.70 11/16/2018   LDLCALC 115 (H) 11/16/2018   ALT 14 02/13/2019   AST 22 02/13/2019   NA 137 02/13/2019   K 3.8 02/13/2019   CL  99 02/13/2019   CREATININE 1.26 (H) 02/13/2019   BUN 24 (H) 02/13/2019   CO2 30 02/13/2019   TSH 3.38 11/16/2018   HGBA1C 6.2 11/16/2018      Assessment & Plan:

## 2019-06-10 ENCOUNTER — Encounter: Payer: Self-pay | Admitting: Internal Medicine

## 2019-06-10 DIAGNOSIS — M25561 Pain in right knee: Secondary | ICD-10-CM | POA: Insufficient documentation

## 2019-06-10 NOTE — Assessment & Plan Note (Signed)
stable overall by history and exam, recent data reviewed with pt, and pt to continue medical treatment as before,  to f/u any worsening symptoms or concerns  

## 2019-06-10 NOTE — Assessment & Plan Note (Signed)
C/w djd, for mobic prn,  to f/u any worsening symptoms or concerns

## 2019-06-29 DIAGNOSIS — Z8616 Personal history of COVID-19: Secondary | ICD-10-CM | POA: Insufficient documentation

## 2019-07-08 ENCOUNTER — Other Ambulatory Visit: Payer: Self-pay

## 2019-07-08 ENCOUNTER — Ambulatory Visit (INDEPENDENT_AMBULATORY_CARE_PROVIDER_SITE_OTHER): Payer: Medicare Other

## 2019-07-08 ENCOUNTER — Encounter (HOSPITAL_COMMUNITY): Payer: Self-pay | Admitting: *Deleted

## 2019-07-08 ENCOUNTER — Ambulatory Visit (HOSPITAL_COMMUNITY)
Admission: EM | Admit: 2019-07-08 | Discharge: 2019-07-08 | Disposition: A | Discharge status: Home / Self Care | Payer: Medicare Other | Attending: Family Medicine | Admitting: Family Medicine

## 2019-07-08 DIAGNOSIS — R0602 Shortness of breath: Secondary | ICD-10-CM

## 2019-07-08 DIAGNOSIS — R05 Cough: Secondary | ICD-10-CM | POA: Diagnosis not present

## 2019-07-08 HISTORY — DX: Interstitial pulmonary disease, unspecified: J84.9

## 2019-07-08 LAB — POC SARS CORONAVIRUS 2 AG: SARS Coronavirus 2 Ag: NEGATIVE

## 2019-07-08 LAB — POC SARS CORONAVIRUS 2 AG -  ED: SARS Coronavirus 2 Ag: NEGATIVE

## 2019-07-08 MED ORDER — AZITHROMYCIN 250 MG PO TABS: 250.0000 mg | ORAL_TABLET | Freq: Every day | ORAL | 0 refills | Status: DC

## 2019-07-08 MED ORDER — PREDNISONE 10 MG PO TABS: 20.0000 mg | ORAL_TABLET | Freq: Every day | ORAL | 0 refills | Status: AC

## 2019-07-08 MED ORDER — BENZONATATE 100 MG PO CAPS: 100.0000 mg | ORAL_CAPSULE | Freq: Three times a day (TID) | ORAL | 0 refills | Status: DC

## 2019-07-08 NOTE — ED Triage Notes (Signed)
C/O increased SOB over past few days with cough.  Denies any fevers, or any other c/o's.  Normally uses 2L o@ via [place]; states last night she had to use 3L.

## 2019-07-08 NOTE — Discharge Instructions (Addendum)
Your chest x ray showed mild worsening of your chronic interstitial lung disease I am sending some prednisone to the pharmacy for you to take daily for the next 5 days.  We will go ahead and cover you with antibiotics today also. Tessalon Perles for cough You need to call your pulmonologist tomorrow to make a follow-up appointment. Continue using your oxygen at home and nebulizer treatments at home  If your symptoms worsen you need to go straight to the ER or call 911.

## 2019-07-08 NOTE — ED Provider Notes (Signed)
[facility]    CSN: [id number] Arrival date & time: 07/08/19  1513      History   Chief Complaint Chief Complaint  Patient presents with  . Shortness of Breath    HPI Kathy [name] is a 79 y.o. female.   Patient is a 79 year old female past medical history of arthritis, CKD, depression, cancer, GERD, hyperlipidemia, hypertension, interstitial lung disease, neuropathy, O2 dependency.  She presents today with increased shortness of breath over the past 2 to 3 days with worsening cough.  Cough is somewhat productive.  Denies any fevers but has had some chills.  Normally uses 2 L of oxygen nasal cannula but has increased to 3 L due to the increase shortness of breath over the past few days.  She has had some mild diarrhea but this is normal for her.  Denies any loss of taste or smell.  Denies any recent sick contacts or recent traveling.  She has had mild increased lower extremity edema.  Denies any history of heart failure.  She has been using nebulizer treatments with some relief of her symptoms.   ROS per HPI      Past Medical History:  Diagnosis Date  . Arthritis    fingers  . CKD (chronic kidney disease) stage 3, GFR 30-59 ml/min 11/30/2017  . Depression   . Dizziness    in AM, getting out of bed  . Dysrhythmia    "skips a beat" sometimes - followed by PCP  . Endometrial ca ([place]) 11/30/2017   S/p surgury 1990's  . GERD (gastroesophageal reflux disease) 11/30/2017  . HLD (hyperlipidemia) 11/30/2017  . Hypercholesteremia   . Hypertension   . Interstitial lung disease ([place])   . Neuropathy    bilateral feet  . Shortness of breath dyspnea   . Sleep apnea    has CPAP, doesn't use  . Umbilical hernia     Patient Active Problem List   Diagnosis Date Noted  . Bilateral knee pain 06/10/2019  . Leg swelling 02/13/2019  . 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 ([place]) [id number]  . Physical deconditioning 02/13/2019  . Vitamin D deficiency 12/04/2018  . B12 deficiency  12/04/2018  . Hyperglycemia 11/16/2018  . Abdominal pain 06/02/2018  . Acute on chronic kidney failure ([place]) 12/12/2017  . Diastolic dysfunction [id number]  . Endometrial ca ([place]) 11/30/2017  . GERD (gastroesophageal reflux disease) 11/30/2017  . HLD (hyperlipidemia) 11/30/2017  . CKD (chronic kidney disease) stage 3, GFR 30-59 ml/min 11/30/2017  . Dyspnea on exertion 11/07/2017  . Cough 11/07/2017  . ILD (interstitial lung disease) ([place]) 03/15/2017  . Chronic respiratory failure with hypoxia ([place]) 03/15/2017  . Oral herpes simplex infection   . Influenza with pneumonia 06/28/2016  . Community acquired pneumonia 06/27/2016  . Hypertension 06/27/2016  . Depression 06/27/2016  . Sleep apnea 06/27/2016    Past Surgical History:  Procedure Laterality Date  . ABDOMINAL HYSTERECTOMY    . BROW LIFT Bilateral 07/15/2015   Procedure: BLEPHAROPLASTY;  Surgeon: Karle Starch, MD;  Location: [facility];  Service: Ophthalmology;  Laterality: Bilateral;  . CHOLECYSTECTOMY    . HAMMER TOE SURGERY    . HERNIA REPAIR    . KNEE ARTHROSCOPY Bilateral   . PTOSIS REPAIR Bilateral 07/15/2015   Procedure: PTOSIS REPAIR;  Surgeon: Karle Starch, MD;  Location: [place] [facility];  Service: Ophthalmology;  Laterality: Bilateral;  CPAP  . TONSILLECTOMY      OB History   No obstetric history on file.  Home Medications    Prior to Admission medications   Medication Sig Start Date End Date Taking? Authorizing Provider  albuterol (VENTOLIN HFA) 108 (90 Base) MCG/ACT inhaler Inhale 1-2 puffs into the lungs every 6 (six) hours as needed for wheezing or shortness of breath. 11/16/18  Yes Biagio Borg, MD  cyanocobalamin (,VITAMIN B-12,) 1000 MCG/ML injection Inject 1 mL (1,000 mcg total) into the muscle every 30 (thirty) days. 12/04/18  Yes Biagio Borg, MD  fenofibrate micronized (LOFIBRA) 134 MG capsule Take 1 capsule (134 mg total) by mouth daily before breakfast. 11/16/18  Yes Biagio Borg, MD  furosemide (LASIX) 40 MG tablet 1 tab by mouth in the AM, and 1 tab by mouth in the PM as needed for persistent swelling or weight gain more than 3-5 lbs 11/16/18  Yes Biagio Borg, MD  losartan (COZAAR) 100 MG tablet Take 0.5 tablets (50 mg total) by mouth daily. 06/08/19  Yes Biagio Borg, MD  meloxicam (MOBIC) 15 MG tablet Take 1 tablet (15 mg total) by mouth daily. 06/08/19  Yes Biagio Borg, MD  metoprolol succinate (TOPROL-XL) 50 MG 24 hr tablet Take 1 tablet (50 mg total) by mouth daily. Take with or immediately following a meal. 11/16/18  Yes Biagio Borg, MD  venlafaxine XR (EFFEXOR XR) 75 MG 24 hr capsule Take 3 capsules (225 mg total) by mouth daily with breakfast. 11/16/18  Yes Biagio Borg, MD  Vitamin D, Ergocalciferol, (DRISDOL) 1.25 MG (50000 UT) CAPS capsule Take 1 capsule (50,000 Units total) by mouth every 7 (seven) days. 12/04/18  Yes Biagio Borg, MD  azithromycin (ZITHROMAX) 250 MG tablet Take 1 tablet (250 mg total) by mouth daily. Take first 2 tablets together, then 1 every day until finished. 07/08/19   Ewen Varnell, Tressia Miners A, NP  benzonatate (TESSALON) 100 MG capsule Take 1 capsule (100 mg total) by mouth every 8 (eight) hours. 07/08/19   Loura Halt A, NP  predniSONE (DELTASONE) 10 MG tablet Take 2 tablets (20 mg total) by mouth daily for 5 days. 07/08/19 07/13/19  Orvan July, NP  Respiratory Therapy Supplies (FLUTTER) DEVI Use as directed 02/13/19   Lauraine Rinne, NP  triamcinolone cream (KENALOG) 0.5 % Apply 1 application topically 2 (two) times daily. 11/16/18   Biagio Borg, MD    Family History Family History  Problem Relation Age of Onset  . Congestive Heart Failure Mother   . Stroke Father   . Parkinson's disease Father     Social History Social History   Tobacco Use  . Smoking status: Former Smoker    Packs/day: 1.00    Years: 5.00    Pack years: 5.00    Types: Cigarettes    Quit date: 06/28/1986    Years since quitting: 33.0  . Smokeless tobacco: Never  Used  . Tobacco comment: quit 40+ yrs ago, 1 PPD for a few years  Substance Use Topics  . Alcohol use: No  . Drug use: No     Allergies   Lipitor [atorvastatin] and Requip [ropinirole hcl]   Review of Systems Review of Systems   Physical Exam Triage Vital Signs ED Triage Vitals [07/08/19 1526]  Enc Vitals Group     BP (!) 189/96     Pulse Rate 76     Resp (!) 22     Temp 97.8 F (36.6 C)     Temp Source Oral     SpO2 96 %  Weight      Height      Head Circumference      Peak Flow      Pain Score 0     Pain Loc      Pain Edu?      Excl. in [place]?    No data found.  Updated Vital Signs BP (!) 189/96   Pulse 76   Temp 97.8 F (36.6 C) (Oral)   Resp (!) 22   SpO2 96%   Visual Acuity Right Eye Distance:   Left Eye Distance:   Bilateral Distance:    Right Eye Near:   Left Eye Near:    Bilateral Near:     Physical Exam Vitals and nursing note reviewed.  Constitutional:      General: She is not in acute distress.    Appearance: She is not ill-appearing, toxic-appearing or diaphoretic.  HENT:     Head: Normocephalic and atraumatic.  Cardiovascular:     Rate and Rhythm: Normal rate and regular rhythm.  Pulmonary:     Effort: Tachypnea present. No respiratory distress.     Breath sounds: Normal breath sounds.  Musculoskeletal:        General: Normal range of motion.     Right lower leg: Tenderness present. Edema present.     Left lower leg: Tenderness present. Edema present.     Comments: Mild generalized erythema and swelling to bilateral lower extremities.  Skin:    General: Skin is warm and dry.  Neurological:     Mental Status: She is alert.  Psychiatric:        Mood and Affect: Mood normal.      UC Treatments / Results  Labs (all labs ordered are listed, but only abnormal results are displayed) Labs Reviewed  POC SARS CORONAVIRUS 2 AG -  ED  POC SARS CORONAVIRUS 2 AG    EKG   Radiology DG Chest 2 View  Result Date:  07/08/2019 CLINICAL DATA:  Cough, dyspnea for 1 week EXAM: CHEST - 2 VIEW COMPARISON:  03/31/2018 chest radiograph. FINDINGS: Stable cardiomediastinal silhouette with top-normal heart size. No pneumothorax. No pleural effusion. Patchy hazy and reticular opacities in both lungs, most prominent in the lower left lung, mildly worsened. No acute consolidative airspace disease. Cholecystectomy clips are seen in the right upper quadrant of the abdomen. IMPRESSION: Mild worsening of chronic patchy hazy and reticular opacities in the left greater than right lungs, favor progression of fibrotic interstitial lung disease as demonstrated on 01/11/2018 high-resolution chest CT study, which favored chronic hypersensitivity pneumonitis. Electronically Signed   By: Ilona Sorrel M.D.   On: 07/08/2019 16:30    Procedures Procedures (including critical care time)  Medications Ordered in UC Medications - No data to display  Initial Impression / Assessment and Plan / UC Course  I have reviewed the triage vital signs and the nursing notes.  Pertinent labs & imaging results that were available during my care of the patient were reviewed by me and considered in my medical decision making (see chart for details).     79 year old female with hx of chronic interstitial lung disease here for increased cough, wheezing, SOB.  Chest x ray showed mild worsening chronic disease otherwise normal.  She has been using albuterol and increased her oxygen which helps.  VSS No acute distress.  Will treat lung inflammation with prednisone burst and go ahead and cover with abx  Tessalon pearls for cough.  Recommended call her pulmonologist  tomorrow.  If symptoms worsen go to the ER.   Final Clinical Impressions(s) / UC Diagnoses   Final diagnoses:  SOB (shortness of breath)  Shortness of breath     Discharge Instructions     Your chest x ray showed mild worsening of your chronic interstitial lung disease I am sending  some prednisone to the pharmacy for you to take daily for the next 5 days.  We will go ahead and cover you with antibiotics today also. Tessalon Perles for cough You need to call your pulmonologist tomorrow to make a follow-up appointment. Continue using your oxygen at home and nebulizer treatments at home  If your symptoms worsen you need to go straight to the ER or call 911.      ED Prescriptions    Medication Sig Dispense Auth. Provider   predniSONE (DELTASONE) 10 MG tablet Take 2 tablets (20 mg total) by mouth daily for 5 days. 10 tablet Quisha Mabie A, NP   azithromycin (ZITHROMAX) 250 MG tablet Take 1 tablet (250 mg total) by mouth daily. Take first 2 tablets together, then 1 every day until finished. 6 tablet Jodilyn Giese A, NP   benzonatate (TESSALON) 100 MG capsule Take 1 capsule (100 mg total) by mouth every 8 (eight) hours. 21 capsule Cozetta Seif A, NP     PDMP not reviewed this encounter.   Orvan July, NP 07/09/19 1753

## 2019-07-11 ENCOUNTER — Encounter: Payer: Self-pay | Admitting: Acute Care

## 2019-07-11 ENCOUNTER — Other Ambulatory Visit: Payer: Self-pay

## 2019-07-11 ENCOUNTER — Ambulatory Visit (INDEPENDENT_AMBULATORY_CARE_PROVIDER_SITE_OTHER): Payer: Medicare Other | Admitting: Acute Care

## 2019-07-11 VITALS — BP 130/64 | HR 61 | Temp 97.3°F | Ht 60.0 in | Wt 249.0 lb

## 2019-07-11 DIAGNOSIS — J849 Interstitial pulmonary disease, unspecified: Secondary | ICD-10-CM

## 2019-07-11 DIAGNOSIS — R0600 Dyspnea, unspecified: Secondary | ICD-10-CM | POA: Diagnosis not present

## 2019-07-11 DIAGNOSIS — J9611 Chronic respiratory failure with hypoxia: Secondary | ICD-10-CM

## 2019-07-11 DIAGNOSIS — J479 Bronchiectasis, uncomplicated: Secondary | ICD-10-CM | POA: Diagnosis not present

## 2019-07-11 DIAGNOSIS — M7989 Other specified soft tissue disorders: Secondary | ICD-10-CM

## 2019-07-11 DIAGNOSIS — L0390 Cellulitis, unspecified: Secondary | ICD-10-CM

## 2019-07-11 DIAGNOSIS — R0609 Other forms of dyspnea: Secondary | ICD-10-CM

## 2019-07-11 MED ORDER — PREDNISONE 10 MG PO TABS: 10.0000 mg | ORAL_TABLET | Freq: Every day | ORAL | 0 refills | Status: DC

## 2019-07-11 NOTE — Patient Instructions (Addendum)
It is good to see you today. We will order an HRCT Chest without contrast  to evaluate for progression of your interstitial lung disease. Continue prednisone as prescribed in the ED until gone. After you have finished the prednisone scheduled by the ED, I will add 10 mg once daily for an additional 5 days. Continue z pack until gone.  Contiue oxygen at 2-3 L  Saturation goals are > 90% Start flutter valve use, 2 times daily 10 breaths each time Remember to use your rescue inhaler as you need for shortness of breath or wheezing. ( Albuterol) We will order a Bilateral Lower Extremity doppler  Follow up in 2 weeks with Judson Roch NP, or Dr. Halford Chessman.  Once you are better we can schedule PFT's to evaluate your lung function. Please contact office for sooner follow up if symptoms do not improve or worsen or seek emergency care  If you develop a fever please call the office.

## 2019-07-11 NOTE — Progress Notes (Signed)
History of Present Illness Kathy [name] is a 79 y.o. 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 She is followed by Kathy [name].  PMH: Hypertension, depression, GERD, hyperlipidemia, chronic kidney disease Smoker/ Smoking History: Former smoker.  Quit 1988.  5-pack-year smoking history. Maintenance: None Pt of: Kathy [name]   07/11/2019  Pt presents for follow up. She was last seen in the office 02/13/2019. She became short of breath and went to the ED on 07/08/2019. She was prescribed a z pack and prednisone taper.  She didn't pick these up until 07/10/2019, but she is currently taking these as prescribed. CXR done in the ED was concerning for mild worsening of chronic patchy hazy and reticular opacities in the left greater than right lungs, favor progression of fibrotic interstitial lung disease as demonstrated on 01/11/2018 high-resolution chest CT study, which favored chronic hypersensitivity pneumonitis.. She states she had a a significant increase in her shortness of breath over the last month or so. Additionally she developed wheezing. Per her daughter she does not take her medications as she should. She rarely uses her rescue inhaler. Sje is non-compliant. She has a bad cough and she has secretions that are light green to light beige. She does endorse occasional blood tinged secretions. She has not been using her flutter valve. ( prescribed in 01/2019). She is very deconditioned. Her daughter states that she only gets out of her wheelchair to go to the table and to the bathroom. I did talk with the patient about whether she feels she is able to manage at home vs in an assisted living facility.She states that her husband is able to help her. I am concerned as the daughter states the patient does not take her medications as she should, and I am wondering if self care is getting too difficult. The other option is in  home care/ assistance  Test Results: Pulmonary tests:  Serology 03/01/17 >> ANA negative, RF < 14, CCP < 16, ANCA negative, SSA/SSB < 0.2 PFT 03/15/17 >> FEV1 1.40 (82%), FEV1% 96, TLC 2.86 (64%), DLCO 70% HP panel 03/15/17 >> negative Aspergillus IgE Panel 07/20/17 >> negative IgE 07/20/17 >> 44 PFT 03/14/18 >> FEV1 1.28 (76%), FEV1% 91, DLCO 59%  Chest imaging:  HRCT chest 02/22/17 >> atherosclerosis, 3 mm LUL nodule, patchy air trapping b/l, patchy reticulation and GGO b/l, minimal traction BTX HRCT chest 01/12/18 >> 3 mm nodule Lt apex, patchy air trapping, patchy subpleural reticulation and GGO, mild traction BTX  Cardiac tests:  Echo 03/21/17 >> EF 65 to 70%, grade 1 DD   CBC Latest Ref Rng & Units 06/08/2019 11/16/2018 05/23/2018  WBC 4.0 - 10.5 K/uL 9.3 10.3 7.5  Hemoglobin 12.0 - 15.0 g/dL 11.8(L) 12.2 13.6  Hematocrit 36.0 - 46.0 % 35.7(L) 35.8(L) 44.3  Platelets 150.0 - 400.0 K/uL 248.0 237.0 217    BMP Latest Ref Rng & Units 06/08/2019 02/13/2019 11/16/2018  Glucose 70 - 99 mg/dL 124(H) 95 91  BUN 6 - 23 mg/dL 18 24(H) 12  Creatinine 0.40 - 1.20 mg/dL 1.11 1.26(H) 0.86  Sodium 135 - 145 mEq/L 140 137 138  Potassium 3.5 - 5.1 mEq/L 3.6 3.8 3.8  Chloride 96 - 112 mEq/L 100 99 95(L)  CO2 19 - 32 mEq/L 30 30 32  Calcium 8.4 - 10.5 mg/dL 9.2 8.8 9.2    BNP    Component Value Date/Time   BNP  115.1 (H) 02/13/2017 1825    ProBNP    Component Value Date/Time   PROBNP 303 02/13/2019 1606   PROBNP 85.0 04/14/2018 1110    PFT    Component Value Date/Time   FEV1PRE 1.28 03/14/2018 1001   FEV1POST 1.27 03/14/2018 1001   FVCPRE 1.40 03/14/2018 1001   FVCPOST 1.35 03/14/2018 1001   TLC 2.86 03/15/2017 0925   DLCOUNC 11.23 03/14/2018 1001   PREFEV1FVCRT 91 03/14/2018 1001   PSTFEV1FVCRT 94 03/14/2018 1001    DG Chest 2 View  Result Date: 07/08/2019 CLINICAL DATA:  Cough, dyspnea for 1 week EXAM: CHEST - 2 VIEW COMPARISON:  03/31/2018 chest radiograph. FINDINGS:  Stable cardiomediastinal silhouette with top-normal heart size. No pneumothorax. No pleural effusion. Patchy hazy and reticular opacities in both lungs, most prominent in the lower left lung, mildly worsened. No acute consolidative airspace disease. Cholecystectomy clips are seen in the right upper quadrant of the abdomen. IMPRESSION: Mild worsening of chronic patchy hazy and reticular opacities in the left greater than right lungs, favor progression of fibrotic interstitial lung disease as demonstrated on 01/11/2018 high-resolution chest CT study, which favored chronic hypersensitivity pneumonitis. Electronically Signed   By: Ilona Sorrel M.D.   On: 07/08/2019 16:30     Past medical hx Past Medical History:  Diagnosis Date  . Arthritis    fingers  . CKD (chronic kidney disease) stage 3, GFR 30-59 ml/min 11/30/2017  . Depression   . Dizziness    in AM, getting out of bed  . Dysrhythmia    "skips a beat" sometimes - followed by PCP  . Endometrial ca ([place]) 11/30/2017   S/p surgury 1990's  . GERD (gastroesophageal reflux disease) 11/30/2017  . HLD (hyperlipidemia) 11/30/2017  . Hypercholesteremia   . Hypertension   . Interstitial lung disease ([place])   . Neuropathy    bilateral feet  . Shortness of breath dyspnea   . Sleep apnea    has CPAP, doesn't use  . Umbilical hernia      Social History   Tobacco Use  . Smoking status: Former Smoker    Packs/day: 1.00    Years: 5.00    Pack years: 5.00    Types: Cigarettes    Quit date: 06/28/1986    Years since quitting: 33.0  . Smokeless tobacco: Never Used  . Tobacco comment: quit 40+ yrs ago, 1 PPD for a few years  Substance Use Topics  . Alcohol use: No  . Drug use: No    Kathy [name] reports that she quit smoking about 33 years ago. Her smoking use included cigarettes. She has a 5.00 pack-year smoking history. She has never used smokeless tobacco. She reports that she does not drink alcohol or use drugs.  Tobacco Cessation: Pt. counseled on  continued smoking cessation. Former smoker Quit 1988 Past surgical hx, Family hx, Social hx all reviewed.  Current Outpatient Medications on File Prior to Visit  Medication Sig  . albuterol (VENTOLIN HFA) 108 (90 Base) MCG/ACT inhaler Inhale 1-2 puffs into the lungs every 6 (six) hours as needed for wheezing or shortness of breath.  Marland Kitchen azithromycin (ZITHROMAX) 250 MG tablet Take 1 tablet (250 mg total) by mouth daily. Take first 2 tablets together, then 1 every day until finished.  . benzonatate (TESSALON) 100 MG capsule Take 1 capsule (100 mg total) by mouth every 8 (eight) hours.  . cyanocobalamin (,VITAMIN B-12,) 1000 MCG/ML injection Inject 1 mL (1,000 mcg total) into the muscle every 30 (thirty) days.  Marland Kitchen  fenofibrate micronized (LOFIBRA) 134 MG capsule Take 1 capsule (134 mg total) by mouth daily before breakfast.  . furosemide (LASIX) 40 MG tablet 1 tab by mouth in the AM, and 1 tab by mouth in the PM as needed for persistent swelling or weight gain more than 3-5 lbs  . losartan (COZAAR) 100 MG tablet Take 0.5 tablets (50 mg total) by mouth daily.  . meloxicam (MOBIC) 15 MG tablet Take 1 tablet (15 mg total) by mouth daily.  . metoprolol succinate (TOPROL-XL) 50 MG 24 hr tablet Take 1 tablet (50 mg total) by mouth daily. Take with or immediately following a meal.  . predniSONE (DELTASONE) 10 MG tablet Take 2 tablets (20 mg total) by mouth daily for 5 days.  Marland Kitchen Respiratory Therapy Supplies (FLUTTER) DEVI Use as directed  . triamcinolone cream (KENALOG) 0.5 % Apply 1 application topically 2 (two) times daily.  Marland Kitchen venlafaxine XR (EFFEXOR XR) 75 MG 24 hr capsule Take 3 capsules (225 mg total) by mouth daily with breakfast.  . Vitamin D, Ergocalciferol, (DRISDOL) 1.25 MG (50000 UT) CAPS capsule Take 1 capsule (50,000 Units total) by mouth every 7 (seven) days.   No current facility-administered medications on file prior to visit.     Allergies  Allergen Reactions  . Lipitor [Atorvastatin]  Other (See Comments)    Memory issues  . Requip [Ropinirole Hcl] Other (See Comments)    Pt reports feeling generally unwell on this medication    Review Of Systems:  Constitutional:   No  weight loss, night sweats,  Fevers, chills, fatigue, or  lassitude.  HEENT:   No headaches,  Difficulty swallowing,  Tooth/dental problems, or  Sore throat,                No sneezing, itching, ear ache, nasal congestion, post nasal drip,   CV:  No chest pain,  Orthopnea, PND, swelling in lower extremities, anasarca, dizziness, palpitations, syncope.   GI  No heartburn, indigestion, abdominal pain, nausea, vomiting, diarrhea, change in bowel habits, loss of appetite, bloody stools.   Resp: No shortness of breath with exertion or at rest.  No excess mucus, no productive cough,  No non-productive cough,  No coughing up of blood.  No change in color of mucus.  No wheezing.  No chest wall deformity  Skin: no rash or lesions.  GU: no dysuria, change in color of urine, no urgency or frequency.  No flank pain, no hematuria   MS:  No joint pain or swelling.  No decreased range of motion.  No back pain.  Psych:  No change in mood or affect. No depression or anxiety.  No memory loss.   Vital Signs BP 130/64 (BP Location: Left Arm, Cuff Size: Normal)   Pulse 61   Temp (!) 97.3 F (36.3 C) (Oral)   Ht 5' (1.524 m)   Wt 249 lb (112.9 kg)   SpO2 93%   BMI 48.63 kg/m    Physical Exam:  General- No distress,  A&Ox3 ENT: No sinus tenderness, TM clear, pale nasal mucosa, no oral exudate,no post nasal drip, no LAN Cardiac: S1, S2, regular rate and rhythm, no murmur Chest: No wheeze/ rales/ dullness; no accessory muscle use, no nasal flaring, no sternal retractions Abd.: Soft Non-tender Ext: No clubbing cyanosis, edema Neuro:  normal strength Skin: No rashes, warm and dry Psych: normal mood and behavior   Assessment/Plan  Chronic respiratory failure with hypoxia (HCC) Plan: Finish prednisone  taper I have extended the  taper the ED gave you Continue oxygen therapy as prescribed Saturation goals are > 88% at all times Continue to increase daily physical activity as able HRCT PFT's once better Use rescue inhaler as needed  For shortness of breath or wheezing Follow-up with Yuvin Bussiere or Kathy [name] in 2 weeks to review the CT for progression of disease  ILD (interstitial lung disease) ([place]) Plan: Continue oxygen therapy as prescribed Continue to increase daily physical activity as able Finish prednisone taper and antibiotic as prescribed. We will get an HRCT to evaluate for progression of disease Rescue inhaler as needed Lab work today, may need to consider repeat echocardiogram if labs are elevated Follow-up with Kathy [name] in 4 months  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 ([place]) Plan: Mucinex 1200 mg BID with full glass of water  Start flutter valve use, 2 times daily 10 breaths each time  Dyspnea on exertion Plan: Lab work today May need to consider repeat echocardiogram if proBNP is elevated  Leg swelling/ Cellulitis Plan: Continue lasix as prescribed Continue using  KENALOG) 0.5 % 30 g 1 11/16/2018    Sig - Route: Apply 1 application topically 2 (two) times daily    Bilateral Lower extremity doppler study this week  Physical deconditioning Plan: Work on increasing daily physical activity She is wheelchair bound.  Follow up in 2 weeks with Judson Roch or Dr. Roby Lofts, NP 07/11/2019  3:22 PM

## 2019-07-12 ENCOUNTER — Ambulatory Visit (HOSPITAL_COMMUNITY)
Admission: RE | Admit: 2019-07-12 | Discharge: 2019-07-12 | Disposition: A | Discharge status: Home / Self Care | Payer: Medicare Other | Source: Ambulatory Visit | Attending: Cardiology | Admitting: Cardiology

## 2019-07-12 ENCOUNTER — Other Ambulatory Visit: Payer: Self-pay

## 2019-07-12 ENCOUNTER — Encounter: Payer: Self-pay | Admitting: Acute Care

## 2019-07-12 DIAGNOSIS — M7989 Other specified soft tissue disorders: Secondary | ICD-10-CM | POA: Diagnosis not present

## 2019-07-16 NOTE — Progress Notes (Signed)
Reviewed and agree with assessment/plan.   Bobby Ragan, MD [place] [facility] 06/23/2016, 12:24 PM Pager:  [phone]  

## 2019-07-23 ENCOUNTER — Other Ambulatory Visit: Payer: Medicare Other

## 2019-07-24 NOTE — Progress Notes (Deleted)
History of Present Illness Kathy [name] is a 79 y.o. 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 She is followed by Dr. Halford Chessman.  [id number], depression, GERD, hyperlipidemia, chronic kidney disease Smoker/ Smoking History:Former smoker. Quit 1988. 5-pack-year smoking history. Maintenance:None Rescue Ventolin inhaler prn Pt of:Dr.Sood  Last seen in the office 07/11/2019 after ED visit 07/08/2019 for for increasing dyspnea over the last month or so. She was treated with a z pack and a prednisone taper. She followed up in the [facility] office, where we extended her prednisone taper, HRCT was ordered ( scheduled 1/25/2021and patient was a no show), Doppler studies were ordered.( Negative for DVT)  Additionally she was started on Mucinex with Flutter valve use daily as mucolytic.She did not get the labs done that were  ordered at her last OV ( BNP)   She is non-compliant with her medications per her daughter.    07/24/2019  Pt. Presents for follow up. She states she has been   Needs PFT's and HRCT Unable to do a 6 minute walk >> wheelchair bound  Test Results: Pulmonary tests:  Serology 03/01/17 >> ANA negative, RF < 14, CCP < 16, ANCA negative, SSA/SSB < 0.2 PFT 03/15/17 >> FEV1 1.40 (82%), FEV1% 96, TLC 2.86 (64%), DLCO 70% HP panel 03/15/17 >> negative Aspergillus IgE Panel 07/20/17 >> negative IgE 07/20/17 >> 44 PFT 03/14/18 >> FEV1 1.28 (76%), FEV1% 91, DLCO 59%  Chest imaging:  HRCT chest 02/22/17 >> atherosclerosis, 3 mm LUL nodule, patchy air trapping b/l, patchy reticulation and GGO b/l, minimal traction BTX HRCT chest 01/12/18 >> 3 mm nodule Lt apex, patchy air trapping, patchy subpleural reticulation and GGO, mild traction BTX HRCT Chest 07/23/2019>> Pt. Was a no show Cardiac tests:  Echo 03/21/17 >> EF 65 to 70%, grade 1 DD   Other 07/12/2019>> Bilateral Lower Extremity  Dopplers>> Negative for DVT   CBC Latest Ref Rng & Units 06/08/2019 11/16/2018 05/23/2018  WBC 4.0 - 10.5 K/uL 9.3 10.3 7.5  Hemoglobin 12.0 - 15.0 g/dL 11.8(L) 12.2 13.6  Hematocrit 36.0 - 46.0 % 35.7(L) 35.8(L) 44.3  Platelets 150.0 - 400.0 K/uL 248.0 237.0 217    BMP Latest Ref Rng & Units 06/08/2019 02/13/2019 11/16/2018  Glucose 70 - 99 mg/dL 124(H) 95 91  BUN 6 - 23 mg/dL 18 24(H) 12  Creatinine 0.40 - 1.20 mg/dL 1.11 1.26(H) 0.86  Sodium 135 - 145 mEq/L 140 137 138  Potassium 3.5 - 5.1 mEq/L 3.6 3.8 3.8  Chloride 96 - 112 mEq/L 100 99 95(L)  CO2 19 - 32 mEq/L 30 30 32  Calcium 8.4 - 10.5 mg/dL 9.2 8.8 9.2    BNP    Component Value Date/Time   BNP 115.1 (H) 02/13/2017 1825    ProBNP    Component Value Date/Time   PROBNP 303 02/13/2019 1606   PROBNP 85.0 04/14/2018 1110    PFT    Component Value Date/Time   FEV1PRE 1.28 03/14/2018 1001   FEV1POST 1.27 03/14/2018 1001   FVCPRE 1.40 03/14/2018 1001   FVCPOST 1.35 03/14/2018 1001   TLC 2.86 03/15/2017 0925   DLCOUNC 11.23 03/14/2018 1001   PREFEV1FVCRT 91 03/14/2018 1001   PSTFEV1FVCRT 94 03/14/2018 1001    DG Chest 2 View  Result Date: 07/08/2019 CLINICAL DATA:  Cough, dyspnea for 1 week EXAM: CHEST - 2 VIEW COMPARISON:  03/31/2018 chest radiograph. FINDINGS: Stable cardiomediastinal silhouette with top-normal  heart size. No pneumothorax. No pleural effusion. Patchy hazy and reticular opacities in both lungs, most prominent in the lower left lung, mildly worsened. No acute consolidative airspace disease. Cholecystectomy clips are seen in the right upper quadrant of the abdomen. IMPRESSION: Mild worsening of chronic patchy hazy and reticular opacities in the left greater than right lungs, favor progression of fibrotic interstitial lung disease as demonstrated on 01/11/2018 high-resolution chest CT study, which favored chronic hypersensitivity pneumonitis. Electronically Signed   By: Ilona Sorrel M.D.   On: 07/08/2019  16:30   VAS [place] LOWER EXTREMITY VENOUS (DVT)  Result Date: 07/13/2019  Lower Venous Study Indications: Patient with chronic shortness of breath due to interstitial lung disease, which she states has gotten worse over the past month. Also bilateral LE edema.  Risk Factors: Obesity. Performing Technologist: Mariane Masters RVT  Examination Guidelines: A complete evaluation includes B-mode imaging, spectral Doppler, color Doppler, and power Doppler as needed of all accessible portions of each vessel. Bilateral testing is considered an integral part of a complete examination. Limited examinations for reoccurring indications may be performed as noted.  +---------+---------------+---------+-----------+----------+--------------+ RIGHT    CompressibilityPhasicitySpontaneityPropertiesThrombus Aging +---------+---------------+---------+-----------+----------+--------------+ CFV      Full           Yes      Yes                                 +---------+---------------+---------+-----------+----------+--------------+ SFJ      Full           Yes      Yes                                 +---------+---------------+---------+-----------+----------+--------------+ FV Prox  Full           Yes      Yes                                 +---------+---------------+---------+-----------+----------+--------------+ FV Mid   Full           Yes      Yes                                 +---------+---------------+---------+-----------+----------+--------------+ FV DistalFull           Yes      Yes                                 +---------+---------------+---------+-----------+----------+--------------+ PFV      Full                                                        +---------+---------------+---------+-----------+----------+--------------+ POP      Full           Yes      Yes                                  +---------+---------------+---------+-----------+----------+--------------+ PTV  Full           Yes      Yes                                 +---------+---------------+---------+-----------+----------+--------------+ PERO     Full           Yes      Yes                                 +---------+---------------+---------+-----------+----------+--------------+ Gastroc  Full                                                        +---------+---------------+---------+-----------+----------+--------------+ GSV      Full           Yes      Yes                                 +---------+---------------+---------+-----------+----------+--------------+   +---------+---------------+---------+-----------+----------+--------------+ LEFT     CompressibilityPhasicitySpontaneityPropertiesThrombus Aging +---------+---------------+---------+-----------+----------+--------------+ CFV      Full           Yes      Yes                                 +---------+---------------+---------+-----------+----------+--------------+ SFJ      Full           Yes      Yes                                 +---------+---------------+---------+-----------+----------+--------------+ FV Prox  Full           Yes      Yes                                 +---------+---------------+---------+-----------+----------+--------------+ FV Mid   Full           Yes      Yes                                 +---------+---------------+---------+-----------+----------+--------------+ FV DistalFull           Yes      Yes                                 +---------+---------------+---------+-----------+----------+--------------+ PFV      Full                                                        +---------+---------------+---------+-----------+----------+--------------+ POP      Full           Yes      Yes                                  +---------+---------------+---------+-----------+----------+--------------+  PTV      Full           Yes      Yes                                 +---------+---------------+---------+-----------+----------+--------------+ PERO     Full           Yes      Yes                                 +---------+---------------+---------+-----------+----------+--------------+ Gastroc                                               Not visualized +---------+---------------+---------+-----------+----------+--------------+ GSV      Full           Yes      Yes                                 +---------+---------------+---------+-----------+----------+--------------+  Summary: Right: No evidence of deep vein thrombosis in the lower extremity. No indirect evidence of obstruction proximal to the inguinal ligament. No cystic structure found in the popliteal fossa. Left: No evidence of deep vein thrombosis in the lower extremity. No indirect evidence of obstruction proximal to the inguinal ligament. No cystic structure found in the popliteal fossa.  *See table(s) above for measurements and observations. Electronically signed by Ida Rogue MD on 07/13/2019 at 4:43:35 PM.    Final      Past medical hx Past Medical History:  Diagnosis Date  . Arthritis    fingers  . CKD (chronic kidney disease) stage 3, GFR 30-59 ml/min 11/30/2017  . Depression   . Dizziness    in AM, getting out of bed  . Dysrhythmia    "skips a beat" sometimes - followed by PCP  . Endometrial ca ([place]) 11/30/2017   S/p surgury 1990's  . GERD (gastroesophageal reflux disease) 11/30/2017  . HLD (hyperlipidemia) 11/30/2017  . Hypercholesteremia   . Hypertension   . Interstitial lung disease ([place])   . Neuropathy    bilateral feet  . Shortness of breath dyspnea   . Sleep apnea    has CPAP, doesn't use  . Umbilical hernia      Social History   Tobacco Use  . Smoking status: Former Smoker    Packs/day: 1.00    Years: 10.00      Pack years: 10.00    Types: Cigarettes    Quit date: 06/28/1986    Years since quitting: 33.0  . Smokeless tobacco: Never Used  . Tobacco comment: quit 40+ yrs ago, 1 PPD for a few years  Substance Use Topics  . Alcohol use: No  . Drug use: No    Ms.Faller reports that she quit smoking about 33 years ago. Her smoking use included cigarettes. She has a 10.00 pack-year smoking history. She has never used smokeless tobacco. She reports that she does not drink alcohol or use drugs.  Tobacco Cessation: Counseling given: Not Answered Comment: quit 40+ yrs ago, 1 PPD for a few years   Past surgical hx, Family hx, Social hx all reviewed.  Current Outpatient Medications on File Prior to  Visit  Medication Sig  . albuterol (VENTOLIN HFA) 108 (90 Base) MCG/ACT inhaler Inhale 1-2 puffs into the lungs every 6 (six) hours as needed for wheezing or shortness of breath.  Marland Kitchen azithromycin (ZITHROMAX) 250 MG tablet Take 1 tablet (250 mg total) by mouth daily. Take first 2 tablets together, then 1 every day until finished.  . benzonatate (TESSALON) 100 MG capsule Take 1 capsule (100 mg total) by mouth every 8 (eight) hours.  . cyanocobalamin (,VITAMIN B-12,) 1000 MCG/ML injection Inject 1 mL (1,000 mcg total) into the muscle every 30 (thirty) days.  . fenofibrate micronized (LOFIBRA) 134 MG capsule Take 1 capsule (134 mg total) by mouth daily before breakfast.  . furosemide (LASIX) 40 MG tablet 1 tab by mouth in the AM, and 1 tab by mouth in the PM as needed for persistent swelling or weight gain more than 3-5 lbs  . losartan (COZAAR) 100 MG tablet Take 0.5 tablets (50 mg total) by mouth daily.  . meloxicam (MOBIC) 15 MG tablet Take 1 tablet (15 mg total) by mouth daily.  . metoprolol succinate (TOPROL-XL) 50 MG 24 hr tablet Take 1 tablet (50 mg total) by mouth daily. Take with or immediately following a meal.  . predniSONE (DELTASONE) 10 MG tablet Take 1 tablet (10 mg total) by mouth daily with breakfast.   . Respiratory Therapy Supplies (FLUTTER) DEVI Use as directed  . triamcinolone cream (KENALOG) 0.5 % Apply 1 application topically 2 (two) times daily.  Marland Kitchen venlafaxine XR (EFFEXOR XR) 75 MG 24 hr capsule Take 3 capsules (225 mg total) by mouth daily with breakfast.  . Vitamin D, Ergocalciferol, (DRISDOL) 1.25 MG (50000 UT) CAPS capsule Take 1 capsule (50,000 Units total) by mouth every 7 (seven) days.   No current facility-administered medications on file prior to visit.     Allergies  Allergen Reactions  . Lipitor [Atorvastatin] Other (See Comments)    Memory issues  . Requip [Ropinirole Hcl] Other (See Comments)    Pt reports feeling generally unwell on this medication    Review Of Systems:  Constitutional:   No  weight loss, night sweats,  Fevers, chills, fatigue, or  lassitude.  HEENT:   No headaches,  Difficulty swallowing,  Tooth/dental problems, or  Sore throat,                No sneezing, itching, ear ache, nasal congestion, post nasal drip,   CV:  No chest pain,  Orthopnea, PND, swelling in lower extremities, anasarca, dizziness, palpitations, syncope.   GI  No heartburn, indigestion, abdominal pain, nausea, vomiting, diarrhea, change in bowel habits, loss of appetite, bloody stools.   Resp: No shortness of breath with exertion or at rest.  No excess mucus, no productive cough,  No non-productive cough,  No coughing up of blood.  No change in color of mucus.  No wheezing.  No chest wall deformity  Skin: no rash or lesions.  GU: no dysuria, change in color of urine, no urgency or frequency.  No flank pain, no hematuria   MS:  No joint pain or swelling.  No decreased range of motion.  No back pain.  Psych:  No change in mood or affect. No depression or anxiety.  No memory loss.   Vital Signs There were no vitals taken for this visit.   Physical Exam:  General- No distress,  A&Ox3 ENT: No sinus tenderness, TM clear, pale nasal mucosa, no oral exudate,no post nasal  drip, no LAN Cardiac: S1,  S2, regular rate and rhythm, no murmur Chest: No wheeze/ rales/ dullness; no accessory muscle use, no nasal flaring, no sternal retractions Abd.: Soft Non-tender Ext: No clubbing cyanosis, edema Neuro:  normal strength Skin: No rashes, warm and dry Psych: normal mood and behavior   Assessment/Plan  No problem-specific Assessment & Plan notes found for this encounter. Chronic respiratory failure with hypoxia (HCC) Plan: Finish prednisone taper I have extended the taper the ED gave you Continue oxygen therapy as prescribed Saturation goals are > 88% at all times Continue to increase daily physical activity as able HRCT to evaluate for progression of disease PFT's once better to evaluate DLCO ( progression of disease)  Use rescue inhaler as needed  For shortness of breath or wheezing   ILD (interstitial lung disease) ([place]) Plan: Continue oxygen therapy as prescribed Continue to increase daily physical activity as able Finish prednisone taper and antibiotic as prescribed. We will get an HRCT to evaluate for progression of disease We will get PFT's to evaluate for progression of disease Rescue inhaler as needed Lab work today, may need to consider repeat echocardiogram if labs are elevated Follow-up with Dr. Halford Chessman in 4 months  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 ([place]) Plan: Mucinex 1200 mg BID with full glass of water  Start flutter valve use, 2 times daily 10 breaths each time  Dyspnea on exertion Plan: Lab work today May need to consider repeat echocardiogram if proBNP is elevated  Leg swelling/ Cellulitis Plan: Continue lasix as prescribed Continue using  KENALOG) 0.5 % 30 g 1 11/16/2018    Sig - Route: Apply 1 application topically 2 (two) times daily    Bilateral Lower extremity doppler study this week  Physical deconditioning Plan: Work on increasing daily physical activity She is wheelchair bound.     Magdalen Spatz,  NP 07/24/2019  2:58 PM

## 2019-07-25 ENCOUNTER — Ambulatory Visit: Payer: Medicare Other | Admitting: Acute Care

## 2019-07-26 ENCOUNTER — Ambulatory Visit: Payer: Medicare Other | Attending: Internal Medicine

## 2019-07-26 DIAGNOSIS — Z20822 Contact with and (suspected) exposure to covid-19: Secondary | ICD-10-CM

## 2019-07-27 LAB — NOVEL CORONAVIRUS, NAA: SARS-CoV-2, NAA: DETECTED — AB

## 2019-07-27 NOTE — Progress Notes (Signed)
Your test for COVID-19 was positive ("detected"), meaning that you were infected with the novel coronavirus and could give the germ to others.    Please continue isolation at home, for at least 10 days since the start of your fever/cough/breathlessness and until you have had 24 hours without fever (without taking a fever reducer) and with any cough/breathlessness improving. Use over-the-counter medications for symptoms.  If you have had no symptoms, but were exposed to someone who was positive for COVID-19, you will need to quarantine and self-isolate for 14 days from the date of exposure.    Please continue good preventive care measures, including:  frequent hand-washing, avoid touching your face, cover coughs/sneezes, stay out of crowds and keep a 6 foot distance from others.  Clean hard surfaces touched frequently with disinfectant cleaning products.   Please check in with your primary care provider about your positive test result.  Go to the nearest urgent care or ED for assessment if you have severe breathlessness or severe weakness/fatigue (ex needing new help getting out of bed or to the bathroom).  Members of your household will also need to quarantine for 14 days from the date of your positive test. You may be contacted to discuss possible treatment options, and you may also be contacted by the health department for follow up. Please call [place] at [phone] if you have any questions or concerns.

## 2019-07-28 ENCOUNTER — Telehealth: Payer: Self-pay | Admitting: Unknown Physician Specialty

## 2019-07-28 NOTE — Telephone Encounter (Signed)
Called to discuss with patient about Covid symptoms and the use of bamlanivimab, a monoclonal antibody infusion for those with mild to moderate Covid symptoms and at a high risk of hospitalization.    Pt can't identify how long she had been sick and likely symptoms ongoing for over 10 days. Quarantine information repeated and knows ER care is necessary for worsening disease.

## 2019-07-31 ENCOUNTER — Ambulatory Visit (INDEPENDENT_AMBULATORY_CARE_PROVIDER_SITE_OTHER): Payer: Medicare Other | Admitting: Internal Medicine

## 2019-07-31 ENCOUNTER — Ambulatory Visit: Payer: Medicare Other | Admitting: Internal Medicine

## 2019-07-31 ENCOUNTER — Encounter: Payer: Self-pay | Admitting: Internal Medicine

## 2019-07-31 DIAGNOSIS — U071 COVID-19: Secondary | ICD-10-CM

## 2019-07-31 NOTE — Assessment & Plan Note (Signed)
See notes

## 2019-07-31 NOTE — Progress Notes (Signed)
Patient ID: Kathy [name], female   DOB: [birth year], 79 y.o.   MRN: [id number]  Phone visit  Cumulative time during 7-day interval 12 min, there was not an associated office visit for this concern within a 7 day period.  Verbal consent for services obtained from patient prior to services given.  Names of all persons present for services: Cathlean Cower, MD, patient  Chief complaint: recent covid infection  History, background , results pertinent : Here with 1/24 onset covid symptoms, 1/28 test covid +, started with URi symptoms and lost taste, exhaustion and general weakness. Now with persistent non prod cough, controlled with tessalon perle prn, and no SOB  Has increased insomnia but declines tx.  Pt denies chest pain, increased sob or doe, wheezing, orthopnea, PND, increased LE swelling, palpitations, dizziness or syncope.  Pt denies new neurological symptoms such as new headache, or facial or extremity weakness or numbness   Pt denies polydipsia, polyuria,  Past Medical History:  Diagnosis Date  . Arthritis    fingers  . CKD (chronic kidney disease) stage 3, GFR 30-59 ml/min 11/30/2017  . Depression   . Dizziness    in AM, getting out of bed  . Dysrhythmia    "skips a beat" sometimes - followed by PCP  . Endometrial ca ([place]) 11/30/2017   S/p surgury 1990's  . GERD (gastroesophageal reflux disease) 11/30/2017  . HLD (hyperlipidemia) 11/30/2017  . Hypercholesteremia   . Hypertension   . Interstitial lung disease ([place])   . Neuropathy    bilateral feet  . Shortness of breath dyspnea   . Sleep apnea    has CPAP, doesn't use  . Umbilical hernia    No results found for this or any previous visit (from the past 53 hour(s)). Current Outpatient Medications on File Prior to Visit  Medication Sig Dispense Refill  . albuterol (VENTOLIN HFA) 108 (90 Base) MCG/ACT inhaler Inhale 1-2 puffs into the lungs every 6 (six) hours as needed for wheezing or shortness of breath. 1 Inhaler 11  . benzonatate  (TESSALON) 100 MG capsule Take 1 capsule (100 mg total) by mouth every 8 (eight) hours. 21 capsule 0  . cyanocobalamin (,VITAMIN B-12,) 1000 MCG/ML injection Inject 1 mL (1,000 mcg total) into the muscle every 30 (thirty) days. 3 mL 3  . fenofibrate micronized (LOFIBRA) 134 MG capsule Take 1 capsule (134 mg total) by mouth daily before breakfast. 90 capsule 3  . furosemide (LASIX) 40 MG tablet 1 tab by mouth in the AM, and 1 tab by mouth in the PM as needed for persistent swelling or weight gain more than 3-5 lbs 60 tablet 11  . losartan (COZAAR) 100 MG tablet Take 0.5 tablets (50 mg total) by mouth daily. 45 tablet 3  . meloxicam (MOBIC) 15 MG tablet Take 1 tablet (15 mg total) by mouth daily. 90 tablet 3  . metoprolol succinate (TOPROL-XL) 50 MG 24 hr tablet Take 1 tablet (50 mg total) by mouth daily. Take with or immediately following a meal. 90 tablet 3  . Respiratory Therapy Supplies (FLUTTER) DEVI Use as directed 1 each 0  . triamcinolone cream (KENALOG) 0.5 % Apply 1 application topically 2 (two) times daily. 30 g 1  . venlafaxine XR (EFFEXOR XR) 75 MG 24 hr capsule Take 3 capsules (225 mg total) by mouth daily with breakfast. 270 capsule 3  . Vitamin D, Ergocalciferol, (DRISDOL) 1.25 MG (50000 UT) CAPS capsule Take 1 capsule (50,000 Units total) by mouth every 7 (seven) days.  12 capsule 0  . azithromycin (ZITHROMAX) 250 MG tablet Take 1 tablet (250 mg total) by mouth daily. Take first 2 tablets together, then 1 every day until finished. (Patient not taking: Reported on 07/31/2019) 6 tablet 0  . predniSONE (DELTASONE) 10 MG tablet Take 1 tablet (10 mg total) by mouth daily with breakfast. (Patient not taking: Reported on 07/31/2019) 5 tablet 0   No current facility-administered medications on file prior to visit.   Lab Results  Component Value Date   WBC 9.3 06/08/2019   HGB 11.8 (L) 06/08/2019   HCT 35.7 (L) 06/08/2019   PLT 248.0 06/08/2019   GLUCOSE 124 (H) 06/08/2019   CHOL 210 (H)  11/16/2018   TRIG 110.0 11/16/2018   HDL 73.70 11/16/2018   LDLCALC 115 (H) 11/16/2018   ALT 15 06/08/2019   AST 21 06/08/2019   NA 140 06/08/2019   K 3.6 06/08/2019   CL 100 06/08/2019   CREATININE 1.11 06/08/2019   BUN 18 06/08/2019   CO2 30 06/08/2019   TSH 3.38 11/16/2018   HGBA1C 5.6 06/08/2019   A/P/next steps:   COVID infection - stable overall by history and exam, recent data reviewed with pt, and pt to continue medical treatment as before,  to f/u any worsening symptoms or concerns  Cathlean Cower MD

## 2019-07-31 NOTE — Patient Instructions (Signed)
See notes

## 2019-07-31 NOTE — Progress Notes (Signed)
   Subjective:    Patient ID: Kathy [name], female    DOB: 1940/08/11, 79 y.o.   MRN: [id number]  HPI    Review of Systems     Objective:   Physical Exam        Assessment & Plan:

## 2019-08-17 ENCOUNTER — Other Ambulatory Visit: Payer: Self-pay | Admitting: *Deleted

## 2019-08-17 MED ORDER — TRIAMCINOLONE ACETONIDE 0.5 % EX CREA: 1.0000 "application " | TOPICAL_CREAM | Freq: Two times a day (BID) | CUTANEOUS | 1 refills | Status: DC

## 2019-11-21 ENCOUNTER — Encounter: Payer: Self-pay | Admitting: Internal Medicine

## 2019-11-30 ENCOUNTER — Other Ambulatory Visit: Payer: Self-pay | Admitting: Internal Medicine

## 2019-11-30 NOTE — Telephone Encounter (Signed)
Please refill as per office routine med refill policy (all routine meds refilled for 3 mo or monthly per pt preference up to one year from last visit, then month to month grace period for 3 mo, then further med refills will have to be denied)  

## 2019-12-01 ENCOUNTER — Other Ambulatory Visit: Payer: Self-pay | Admitting: Internal Medicine

## 2019-12-02 ENCOUNTER — Encounter: Payer: Self-pay | Admitting: Internal Medicine

## 2019-12-02 NOTE — Telephone Encounter (Signed)
Please refill as per office routine med refill policy (all routine meds refilled for 3 mo or monthly per pt preference up to one year from last visit, then month to month grace period for 3 mo, then further med refills will have to be denied)  

## 2019-12-07 ENCOUNTER — Ambulatory Visit: Payer: Medicare Other | Admitting: Internal Medicine

## 2019-12-07 DIAGNOSIS — Z0289 Encounter for other administrative examinations: Secondary | ICD-10-CM

## 2019-12-17 ENCOUNTER — Telehealth: Payer: Self-pay | Admitting: Internal Medicine

## 2019-12-17 NOTE — Telephone Encounter (Signed)
New message:   Pt is calling and has an appt tomorrow and would like labs since she hasn't had labs in a while. She wants to know if they need to be fasting. Please advise.

## 2019-12-18 ENCOUNTER — Other Ambulatory Visit: Payer: Self-pay

## 2019-12-18 ENCOUNTER — Encounter: Payer: Self-pay | Admitting: Internal Medicine

## 2019-12-18 ENCOUNTER — Ambulatory Visit (INDEPENDENT_AMBULATORY_CARE_PROVIDER_SITE_OTHER): Payer: Medicare Other | Admitting: Internal Medicine

## 2019-12-18 ENCOUNTER — Other Ambulatory Visit: Payer: Self-pay | Admitting: Internal Medicine

## 2019-12-18 VITALS — BP 142/92 | HR 133 | Temp 98.0°F | Ht 60.0 in

## 2019-12-18 DIAGNOSIS — E538 Deficiency of other specified B group vitamins: Secondary | ICD-10-CM | POA: Diagnosis not present

## 2019-12-18 DIAGNOSIS — I10 Essential (primary) hypertension: Secondary | ICD-10-CM

## 2019-12-18 DIAGNOSIS — R739 Hyperglycemia, unspecified: Secondary | ICD-10-CM | POA: Diagnosis not present

## 2019-12-18 DIAGNOSIS — J9611 Chronic respiratory failure with hypoxia: Secondary | ICD-10-CM

## 2019-12-18 DIAGNOSIS — E785 Hyperlipidemia, unspecified: Secondary | ICD-10-CM

## 2019-12-18 DIAGNOSIS — J849 Interstitial pulmonary disease, unspecified: Secondary | ICD-10-CM

## 2019-12-18 DIAGNOSIS — N183 Chronic kidney disease, stage 3 unspecified: Secondary | ICD-10-CM | POA: Diagnosis not present

## 2019-12-18 DIAGNOSIS — E559 Vitamin D deficiency, unspecified: Secondary | ICD-10-CM

## 2019-12-18 LAB — BASIC METABOLIC PANEL
BUN: 24 mg/dL — ABNORMAL HIGH (ref 6–23)
CO2: 30 mEq/L (ref 19–32)
Calcium: 9.7 mg/dL (ref 8.4–10.5)
Chloride: 100 mEq/L (ref 96–112)
Creatinine, Ser: 1.19 mg/dL (ref 0.40–1.20)
GFR: 43.75 mL/min — ABNORMAL LOW (ref >60.00)
Glucose, Bld: 94 mg/dL (ref 70–99)
Potassium: 4 mEq/L (ref 3.5–5.1)
Sodium: 138 mEq/L (ref 135–145)

## 2019-12-18 LAB — URINALYSIS, ROUTINE W REFLEX MICROSCOPIC
Bilirubin Urine: NEGATIVE
Hgb urine dipstick: NEGATIVE
Ketones, ur: NEGATIVE
Nitrite: POSITIVE — AB
RBC / HPF: NONE SEEN (ref 0–?)
Specific Gravity, Urine: 1.01 (ref 1.000–1.030)
Total Protein, Urine: NEGATIVE
Urine Glucose: NEGATIVE
Urobilinogen, UA: 1 (ref 0.0–1.0)
pH: 6.5 (ref 5.0–8.0)

## 2019-12-18 LAB — CBC WITH DIFFERENTIAL/PLATELET
Basophils Absolute: 0 10*3/uL (ref 0.0–0.1)
Basophils Relative: 0.6 % (ref 0.0–3.0)
Eosinophils Absolute: 0.2 10*3/uL (ref 0.0–0.7)
Eosinophils Relative: 2.7 % (ref 0.0–5.0)
HCT: 34 % — ABNORMAL LOW (ref 36.0–46.0)
Hemoglobin: 11.4 g/dL — ABNORMAL LOW (ref 12.0–15.0)
Lymphocytes Relative: 25.2 % (ref 12.0–46.0)
Lymphs Abs: 1.6 10*3/uL (ref 0.7–4.0)
MCHC: 33.4 g/dL (ref 30.0–36.0)
MCV: 96.1 fl (ref 78.0–100.0)
Monocytes Absolute: 0.6 10*3/uL (ref 0.1–1.0)
Monocytes Relative: 9.6 % (ref 3.0–12.0)
Neutro Abs: 3.9 10*3/uL (ref 1.4–7.7)
Neutrophils Relative %: 61.9 % (ref 43.0–77.0)
Platelets: 216 10*3/uL (ref 150.0–400.0)
RBC: 3.54 Mil/uL — ABNORMAL LOW (ref 3.87–5.11)
RDW: 13.7 % (ref 11.5–15.5)
WBC: 6.3 10*3/uL (ref 4.0–10.5)

## 2019-12-18 LAB — LIPID PANEL
Cholesterol: 130 mg/dL (ref 0–200)
HDL: 47.4 mg/dL (ref >39.00)
LDL Cholesterol: 68 mg/dL (ref 0–99)
NonHDL: 83.07
Total CHOL/HDL Ratio: 3
Triglycerides: 74 mg/dL (ref 0.0–149.0)
VLDL: 14.8 mg/dL (ref 0.0–40.0)

## 2019-12-18 LAB — HEPATIC FUNCTION PANEL
ALT: 22 U/L (ref 0–35)
AST: 37 U/L (ref 0–37)
Albumin: 3.9 g/dL (ref 3.5–5.2)
Alkaline Phosphatase: 41 U/L (ref 39–117)
Bilirubin, Direct: 0.2 mg/dL (ref 0.0–0.3)
Total Bilirubin: 0.7 mg/dL (ref 0.2–1.2)
Total Protein: 7.3 g/dL (ref 6.0–8.3)

## 2019-12-18 LAB — VITAMIN D 25 HYDROXY (VIT D DEFICIENCY, FRACTURES): VITD: 24.52 ng/mL — ABNORMAL LOW (ref 30.00–100.00)

## 2019-12-18 LAB — TSH: TSH: 2.46 u[IU]/mL (ref 0.35–4.50)

## 2019-12-18 LAB — HEMOGLOBIN A1C: Hgb A1c MFr Bld: 5.6 % (ref 4.6–6.5)

## 2019-12-18 LAB — VITAMIN B12: Vitamin B-12: 482 pg/mL (ref 211–911)

## 2019-12-18 MED ORDER — CIPROFLOXACIN HCL 500 MG PO TABS: 500.0000 mg | ORAL_TABLET | Freq: Two times a day (BID) | ORAL | 0 refills | Status: AC

## 2019-12-18 NOTE — Assessment & Plan Note (Signed)
stable overall by history and exam, recent data reviewed with pt, and pt to continue medical treatment as before,  to f/u any worsening symptoms or concerns  

## 2019-12-18 NOTE — Progress Notes (Signed)
Subjective:    Patient ID: Kathy [name], female    DOB: 01/19/1941, 79 y.o.   MRN: [id number]  HPI  Here to f/u; overall doing ok,  Pt denies chest pain, increasing sob or doe, wheezing, orthopnea, PND, increased LE swelling, palpitations, dizziness or syncope.  Pt denies new neurological symptoms such as new headache, or facial or extremity weakness or numbness.  Pt denies polydipsia, polyuria, or low sugar episode.  Pt states overall good compliance with meds, mostly trying to follow appropriate diet, with wt overall stable, Pt did not f/u at 2 wks in jan 2021 with pulm.  Wt Readings from Last 3 Encounters:  07/11/19 249 lb (112.9 kg)  06/08/19 242 lb (109.8 kg)  02/13/19 242 lb 6.4 oz (110 kg)   Past Medical History:  Diagnosis Date  . Arthritis    fingers  . CKD (chronic kidney disease) stage 3, GFR 30-59 ml/min 11/30/2017  . Depression   . Dizziness    in AM, getting out of bed  . Dysrhythmia    "skips a beat" sometimes - followed by PCP  . Endometrial ca ([place]) 11/30/2017   S/p surgury 1990's  . GERD (gastroesophageal reflux disease) 11/30/2017  . HLD (hyperlipidemia) 11/30/2017  . Hypercholesteremia   . Hypertension   . Interstitial lung disease ([place])   . Neuropathy    bilateral feet  . Shortness of breath dyspnea   . Sleep apnea    has CPAP, doesn't use  . Umbilical hernia    Past Surgical History:  Procedure Laterality Date  . ABDOMINAL HYSTERECTOMY    . BROW LIFT Bilateral 07/15/2015   Procedure: BLEPHAROPLASTY;  Surgeon: Karle Starch, MD;  Location: [facility];  Service: Ophthalmology;  Laterality: Bilateral;  . CHOLECYSTECTOMY    . HAMMER TOE SURGERY    . HERNIA REPAIR    . KNEE ARTHROSCOPY Bilateral   . PTOSIS REPAIR Bilateral 07/15/2015   Procedure: PTOSIS REPAIR;  Surgeon: Karle Starch, MD;  Location: [facility];  Service: Ophthalmology;  Laterality: Bilateral;  CPAP  . TONSILLECTOMY      reports that she quit smoking about 33 years ago. Her  smoking use included cigarettes. She has a 10.00 pack-year smoking history. She has never used smokeless tobacco. She reports that she does not drink alcohol and does not use drugs. family history includes Congestive Heart Failure in her mother; Parkinson's disease in her father; Stroke in her father. Allergies  Allergen Reactions  . Lipitor [Atorvastatin] Other (See Comments)    Memory issues  . Requip [Ropinirole Hcl] Other (See Comments)    Pt reports feeling generally unwell on this medication  ' Current Outpatient Medications on File Prior to Visit  Medication Sig Dispense Refill  . albuterol (VENTOLIN HFA) 108 (90 Base) MCG/ACT inhaler Inhale 1-2 puffs into the lungs every 6 (six) hours as needed for wheezing or shortness of breath. 1 Inhaler 11  . benzonatate (TESSALON) 100 MG capsule Take 1 capsule (100 mg total) by mouth every 8 (eight) hours. 21 capsule 0  . cyanocobalamin (,VITAMIN B-12,) 1000 MCG/ML injection Inject 1 mL (1,000 mcg total) into the muscle every 30 (thirty) days. 3 mL 3  . fenofibrate micronized (LOFIBRA) 134 MG capsule 1 CAPSULE BY MOUTH ONCE DAILY BEFORE BREAKFAST 90 capsule 2  . furosemide (LASIX) 40 MG tablet 1 tab by mouth in the AM, and 1 tab by mouth in the PM as needed for persistent swelling or weight gain more than 3-5  lbs 60 tablet 11  . losartan (COZAAR) 100 MG tablet Take 0.5 tablets (50 mg total) by mouth daily. 45 tablet 3  . meloxicam (MOBIC) 15 MG tablet Take 1 tablet (15 mg total) by mouth daily. 90 tablet 3  . metoprolol succinate (TOPROL-XL) 50 MG 24 hr tablet TAKE 1 TABLET BY MOUTH ONCE DAILY, TAKE WITH OR IMMEDIATELY FOLLOWING A MEAL 90 tablet 2  . Respiratory Therapy Supplies (FLUTTER) DEVI Use as directed 1 each 0  . triamcinolone cream (KENALOG) 0.5 % Apply 1 application topically 2 (two) times daily. 30 g 1  . venlafaxine XR (EFFEXOR XR) 75 MG 24 hr capsule Take 3 capsules (225 mg total) by mouth daily with breakfast. 270 capsule 3   No  current facility-administered medications on file prior to visit.   Review of Systems All otherwise neg per pt     Objective:   Physical Exam BP (!) 142/92 (BP Location: Left Arm, Patient Position: Sitting, Cuff Size: Large)   Pulse (!) 133   Temp 98 F (36.7 C) (Oral)   Ht 5' (1.524 m)   SpO2 99%   BMI 48.63 kg/m  VS noted,  Constitutional: Pt appears in NAD HENT: Head: NCAT.  Right Ear: External ear normal.  Left Ear: External ear normal.  Eyes: . Pupils are equal, round, and reactive to light. Conjunctivae and EOM are normal Nose: without d/c or deformity Neck: Neck supple. Gross normal ROM Cardiovascular: Normal rate and regular rhythm.   Pulmonary/Chest: Effort normal and breath sounds decreased  without rales or wheezing.  Abd:  Soft, NT, ND, + BS, no organomegaly Neurological: Pt is alert. At baseline orientation, motor grossly intact Skin: Skin is warm. No rashes, other new lesions, trace bilat ankle LE edema Psychiatric: Pt behavior is normal without agitation  All otherwise neg per pt Lab Results  Component Value Date   WBC 9.3 06/08/2019   HGB 11.8 (L) 06/08/2019   HCT 35.7 (L) 06/08/2019   PLT 248.0 06/08/2019   GLUCOSE 124 (H) 06/08/2019   CHOL 210 (H) 11/16/2018   TRIG 110.0 11/16/2018   HDL 73.70 11/16/2018   LDLCALC 115 (H) 11/16/2018   ALT 15 06/08/2019   AST 21 06/08/2019   NA 140 06/08/2019   K 3.6 06/08/2019   CL 100 06/08/2019   CREATININE 1.11 06/08/2019   BUN 18 06/08/2019   CO2 30 06/08/2019   TSH 3.38 11/16/2018   HGBA1C 5.6 06/08/2019      Assessment & Plan:

## 2019-12-18 NOTE — Patient Instructions (Signed)
Please continue all other medications as before, and refills have been done if requested.  Please have the pharmacy call with any other refills you may need.  Please continue your efforts at being more active, low cholesterol diet, and weight control.  You are otherwise up to date with prevention measures today.  Please keep your appointments with your specialists as you may have planned  You will be contacted regarding the referral for: Dr Halford Chessman or Clarise Cruz at Pulmonary  Please go to the LAB at the blood drawing area for the tests to be done  You will be contacted by phone if any changes need to be made immediately.  Otherwise, you will receive a letter about your results with an explanation, but please check with MyChart first.  Please remember to sign up for MyChart if you have not done so, as this will be important to you in the future with finding out test results, communicating by private email, and scheduling acute appointments online when needed.  Please make an Appointment to return in 4 months, or sooner if needed

## 2019-12-18 NOTE — Assessment & Plan Note (Signed)
Cont oral replacement and f/u lab next visit

## 2019-12-18 NOTE — Telephone Encounter (Signed)
Pt will do labs before coming into her apptmnt this morning.

## 2019-12-18 NOTE — Assessment & Plan Note (Addendum)
For referral for f/u pulm as pt apparently lost track of f/u  I spent 41 minutes in preparing to see the patient by review of recent labs, imaging and procedures, obtaining and reviewing separately obtained history, communicating with the patient and family or caregiver, ordering medications, tests or procedures, and documenting clinical information in the EHR including the differential Dx, treatment, and any further evaluation and other management of ILD, chronic resp failure, b12 and D deficiency, ckd, hld, hyperglycemia, htn

## 2019-12-18 NOTE — Assessment & Plan Note (Signed)
Cont oral replacement 

## 2019-12-20 ENCOUNTER — Telehealth: Payer: Self-pay | Admitting: Internal Medicine

## 2019-12-20 NOTE — Telephone Encounter (Signed)
    Patient calling to discuss ciprofloxacin (CIPRO) 500 MG tablet Patient states she was not told anything about being prescribed this medication

## 2019-12-21 NOTE — Telephone Encounter (Signed)
Pt contacted and informed of results. Pt stated understanding and will call if she has any questions.

## 2020-01-10 ENCOUNTER — Other Ambulatory Visit: Payer: Self-pay | Admitting: Internal Medicine

## 2020-01-30 ENCOUNTER — Encounter: Payer: Self-pay | Admitting: Pulmonary Disease

## 2020-01-30 ENCOUNTER — Ambulatory Visit (INDEPENDENT_AMBULATORY_CARE_PROVIDER_SITE_OTHER): Payer: Medicare Other | Admitting: Pulmonary Disease

## 2020-01-30 ENCOUNTER — Other Ambulatory Visit: Payer: Self-pay

## 2020-01-30 VITALS — BP 124/72 | HR 100 | Temp 97.9°F | Ht 60.0 in | Wt 226.8 lb

## 2020-01-30 DIAGNOSIS — J849 Interstitial pulmonary disease, unspecified: Secondary | ICD-10-CM

## 2020-01-30 DIAGNOSIS — R0602 Shortness of breath: Secondary | ICD-10-CM

## 2020-01-30 DIAGNOSIS — I2729 Other secondary pulmonary hypertension: Secondary | ICD-10-CM

## 2020-01-30 NOTE — Patient Instructions (Signed)
We will check some labs today to evaluate you for interstitial lung disease Check N-terminal proBNP and echocardiogram Schedule high-res CT, PFTs for reassessment of lung Follow-up in 3 months.

## 2020-01-30 NOTE — Progress Notes (Signed)
Kathy [name]    [id number]    Jan 22, 1941  Primary Care Physician:John, Hunt Oris, MD  Referring Physician: Biagio Borg, MD [address] [place],  [place] [address]  Chief complaint: Consult for ILD  HPI: 79 year old with interstitial lung disease, hypertension, hyperlipidemia Followed by Dr. Halford Chessman for ILD, sleep Complains of chronic cough for several years.  States that she has significant dyspnea on exertion.  On 2 L supplemental oxygen She has been treated intermittently with steroids in the past.  Not on any standing therapy.  Developed COVID-19 in January 2021.  She did not require hospitalization Has history of OSA but is not using CPAP on a regular basis.   Pets: She had a cat until 2020, no birds Occupation: Retired Dance movement psychotherapist Exposures: No known exposure.  No mold, hot tub, Jacuzzi ILD questionnaire 01/30/2020-negative Smoking history: 10-pack-year smoker in her 57s Travel history: No significant travel history Relevant family history: No significant family issue of lung disease  Outpatient Encounter Medications as of 01/30/2020  Medication Sig  . albuterol (VENTOLIN HFA) 108 (90 Base) MCG/ACT inhaler Inhale 1-2 puffs into the lungs every 6 (six) hours as needed for wheezing or shortness of breath.  . benzonatate (TESSALON) 100 MG capsule Take 1 capsule (100 mg total) by mouth every 8 (eight) hours.  . cyanocobalamin (,VITAMIN B-12,) 1000 MCG/ML injection INJECT 1 ML IM EVERY 30 DAYS  . fenofibrate micronized (LOFIBRA) 134 MG capsule 1 CAPSULE BY MOUTH ONCE DAILY BEFORE BREAKFAST  . furosemide (LASIX) 40 MG tablet 1 tab by mouth in the AM, and 1 tab by mouth in the PM as needed for persistent swelling or weight gain more than 3-5 lbs  . losartan (COZAAR) 100 MG tablet Take 0.5 tablets (50 mg total) by mouth daily.  . meloxicam (MOBIC) 15 MG tablet Take 1 tablet (15 mg total) by mouth daily.  . metoprolol succinate (TOPROL-XL) 50 MG 24 hr tablet TAKE 1  TABLET BY MOUTH ONCE DAILY, TAKE WITH OR IMMEDIATELY FOLLOWING A MEAL  . Respiratory Therapy Supplies (FLUTTER) DEVI Use as directed  . triamcinolone cream (KENALOG) 0.5 % Apply 1 application topically 2 (two) times daily.  Marland Kitchen venlafaxine XR (EFFEXOR XR) 75 MG 24 hr capsule Take 3 capsules (225 mg total) by mouth daily with breakfast.   No facility-administered encounter medications on file as of 01/30/2020.    Allergies as of 01/30/2020 - Review Complete 01/30/2020  Allergen Reaction Noted  . Lipitor [atorvastatin] Other (See Comments) 07/08/2015  . Requip [ropinirole hcl] Other (See Comments) 02/13/2017    Past Medical History:  Diagnosis Date  . Arthritis    fingers  . CKD (chronic kidney disease) stage 3, GFR 30-59 ml/min 11/30/2017  . Depression   . Dizziness    in AM, getting out of bed  . Dysrhythmia    "skips a beat" sometimes - followed by PCP  . Endometrial ca ([place]) 11/30/2017   S/p surgury 1990's  . GERD (gastroesophageal reflux disease) 11/30/2017  . HLD (hyperlipidemia) 11/30/2017  . Hypercholesteremia   . Hypertension   . Interstitial lung disease ([facility])   . Neuropathy    bilateral feet  . Shortness of breath dyspnea   . Sleep apnea    has CPAP, doesn't use  . Umbilical hernia     Past Surgical History:  Procedure Laterality Date  . ABDOMINAL HYSTERECTOMY    . BROW LIFT Bilateral 07/15/2015   Procedure: BLEPHAROPLASTY;  Surgeon: Warren Lacy  Dennie Maizes, MD;  Location: [facility];  Service: Ophthalmology;  Laterality: Bilateral;  . CHOLECYSTECTOMY    . HAMMER TOE SURGERY    . HERNIA REPAIR    . KNEE ARTHROSCOPY Bilateral   . PTOSIS REPAIR Bilateral 07/15/2015   Procedure: PTOSIS REPAIR;  Surgeon: Karle Starch, MD;  Location: [facility];  Service: Ophthalmology;  Laterality: Bilateral;  CPAP  . TONSILLECTOMY      Family History  Problem Relation Age of Onset  . Congestive Heart Failure Mother   . Stroke Father   . Parkinson's disease Father     Social  History   Socioeconomic History  . Marital status: Married    Spouse name: Not on file  . Number of children: Not on file  . Years of education: Not on file  . Highest education level: Not on file  Occupational History  . Occupation: Retired  Tobacco Use  . Smoking status: Former Smoker    Packs/day: 1.00    Years: 10.00    Pack years: 10.00    Types: Cigarettes    Quit date: 06/28/1986    Years since quitting: 33.6  . Smokeless tobacco: Never Used  . Tobacco comment: quit 40+ yrs ago, 1 PPD for a few years  Vaping Use  . Vaping Use: Never used  Substance and Sexual Activity  . Alcohol use: No  . Drug use: No  . Sexual activity: Not on file  Other Topics Concern  . Not on file  Social History Narrative  . Not on file   Social Determinants of Health   Financial Resource Strain:   . Difficulty of Paying Living Expenses:   Food Insecurity:   . Worried About Charity fundraiser in the Last Year:   . Arboriculturist in the Last Year:   Transportation Needs:   . Film/video editor (Medical):   Marland Kitchen Lack of Transportation (Non-Medical):   Physical Activity:   . Days of Exercise per Week:   . Minutes of Exercise per Session:   Stress:   . Feeling of Stress :   Social Connections:   . Frequency of Communication with Friends and Family:   . Frequency of Social Gatherings with Friends and Family:   . Attends Religious Services:   . Active Member of Clubs or Organizations:   . Attends Archivist Meetings:   Marland Kitchen Marital Status:   Intimate Partner Violence:   . Fear of Current or Ex-Partner:   . Emotionally Abused:   Marland Kitchen Physically Abused:   . Sexually Abused:     Review of systems: Review of Systems  Constitutional: Negative for fever and chills.  HENT: Negative.   Eyes: Negative for blurred vision.  Respiratory: as per HPI  Cardiovascular: Negative for chest pain and palpitations.  Gastrointestinal: Negative for vomiting, diarrhea, blood per  rectum. Genitourinary: Negative for dysuria, urgency, frequency and hematuria.  Musculoskeletal: Negative for myalgias, back pain and joint pain.  Skin: Negative for itching and rash.  Neurological: Negative for dizziness, tremors, focal weakness, seizures and loss of consciousness.  Endo/Heme/Allergies: Negative for environmental allergies.  Psychiatric/Behavioral: Negative for depression, suicidal ideas and hallucinations.  All other systems reviewed and are negative.  Physical Exam: Blood pressure 124/72, pulse 100, temperature 97.9 F (36.6 C), temperature source Oral, height 5' (1.524 m), weight 226 lb 12.8 oz (102.9 kg), SpO2 95 %. Gen:      No acute distress HEENT:  EOMI, sclera anicteric Neck:  No masses; no thyromegaly Lungs:    Clear to auscultation bilaterally; normal respiratory effort CV:         Regular rate and rhythm; no murmurs Abd:      + bowel sounds; soft, non-tender; no palpable masses, no distension Ext:    No edema; adequate peripheral perfusion Skin:      Warm and dry; no rash Neuro: alert and oriented x 3 Psych: normal mood and affect  Data Reviewed: Imaging: HRCT 02/22/2017-patchy air trapping with reticulation, groundglass with traction bronchiectasis. High-resolution CT scan 01/11/2018-prominent patchy airspace disease with out trapping, subpleural reticulation, groundglass with no basal gradient Findings suggestive of chronic HP with mild worsening compared to 2018.  Dilated pulmonary artery I have reviewed the images personally  PFTs: 03/14/2018 FVC 1.35 [60%], FEV1 1.27 [75%], F/F 94, DLCO 11.23 [59%] Mild restriction on spirometry, mild to moderate diffusion defect.  Worsened since 2018   Labs: CTD serologies 03/01/2017-negative ANA, CCP, rule,, RA, SSA, SSB Hypersensitivity panel 03/15/2017 and 07/20/17-negative  Assessment:  Interstitial lung disease CT scan and PFTs with progression.  It has an appearance of chronic HP with prominent air  trapping although extensive history taking and ILD questionnaire does not reveal any significant exposures.  Don't feel she would toleate lung biopsy.  Can consider just a BAL to evaluate cell count to check for lymphocytosis We will repeat labs, HRCT and PFTs If there is progression of fibrosis then consider antifibrotic therapy Discuss at multidisciplinary conference after CT scan  Lower extremity edema Check proBNP and echocardiogram to evaluate pulmonary hypertension  Plan/Recommendations: CTD serologies, HRCT, PFTs proBNP, echocardiogram  Marshell Garfinkel MD [facility] 01/30/2020, 11:19 AM  CC: Biagio Borg, MD

## 2020-02-02 LAB — ANTI-NUCLEAR AB-TITER (ANA TITER): ANA Titer 1: 1:320 {titer} — ABNORMAL HIGH

## 2020-02-02 LAB — ANTI-SCLERODERMA ANTIBODY: Scleroderma (Scl-70) (ENA) Antibody, IgG: <1 AI

## 2020-02-02 LAB — SJOGREN'S SYNDROME ANTIBODS(SSA + SSB)
SSA (Ro) (ENA) Antibody, IgG: <1 AI
SSB (La) (ENA) Antibody, IgG: <1 AI

## 2020-02-02 LAB — ANA,IFA RA DIAG PNL W/RFLX TIT/PATN
Anti Nuclear Antibody (ANA): POSITIVE — AB
Cyclic Citrullin Peptide Ab: <16 UNITS
Rheumatoid fact SerPl-aCnc: <14 IU/mL (ref <14)

## 2020-02-02 LAB — ANCA SCREEN W REFLEX TITER: ANCA Screen: NEGATIVE

## 2020-02-04 LAB — HYPERSENSITIVITY PNEUMONITIS
A. Pullulans Abs: NEGATIVE
A.Fumigatus #1 Abs: NEGATIVE
Micropolyspora faeni, IgG: NEGATIVE
Pigeon Serum Abs: NEGATIVE
Thermoact. Saccharii: NEGATIVE
Thermoactinomyces vulgaris, IgG: NEGATIVE

## 2020-02-08 ENCOUNTER — Ambulatory Visit
Admission: RE | Admit: 2020-02-08 | Discharge: 2020-02-08 | Disposition: A | Discharge status: Home / Self Care | Payer: Medicare Other | Source: Ambulatory Visit | Attending: Pulmonary Disease | Admitting: Pulmonary Disease

## 2020-02-08 ENCOUNTER — Other Ambulatory Visit: Payer: Self-pay

## 2020-02-08 DIAGNOSIS — I2510 Atherosclerotic heart disease of native coronary artery without angina pectoris: Secondary | ICD-10-CM | POA: Diagnosis not present

## 2020-02-08 DIAGNOSIS — I700 Atherosclerosis of aorta: Secondary | ICD-10-CM | POA: Diagnosis not present

## 2020-02-08 DIAGNOSIS — J84112 Idiopathic pulmonary fibrosis: Secondary | ICD-10-CM | POA: Diagnosis not present

## 2020-02-08 DIAGNOSIS — J679 Hypersensitivity pneumonitis due to unspecified organic dust: Secondary | ICD-10-CM | POA: Diagnosis not present

## 2020-02-08 DIAGNOSIS — J849 Interstitial pulmonary disease, unspecified: Secondary | ICD-10-CM | POA: Diagnosis not present

## 2020-02-13 LAB — MYOMARKER 3 PLUS PROFILE (RDL)
Anti-EJ Ab (RDL): NEGATIVE
Anti-Jo-1 Ab (RDL): <20 Units (ref <20)
Anti-Ku Ab (RDL): NEGATIVE
Anti-MDA-5 Ab (CADM-140)(RDL): <20 Units (ref <20)
Anti-Mi-2 Ab (RDL): NEGATIVE
Anti-NXP-2 (P140) Ab (RDL): <20 Units (ref <20)
Anti-OJ Ab (RDL): NEGATIVE
Anti-PL-12 Ab (RDL: NEGATIVE
Anti-PL-7 Ab (RDL): NEGATIVE
Anti-PM/Scl-100 Ab (RDL): <20 Units (ref <20)
Anti-SAE1 Ab, IgG (RDL): <20 Units (ref <20)
Anti-SRP Ab (RDL): NEGATIVE
Anti-SS-A 52kD Ab, IgG (RDL): 29 Units — ABNORMAL HIGH (ref <20)
Anti-TIF-1gamma Ab (RDL): <20 Units (ref <20)
Anti-U1 RNP Ab (RDL): <20 Units (ref <20)
Anti-U2 RNP Ab (RDL): NEGATIVE
Anti-U3 RNP (Fibrillarin)(RDL): NEGATIVE

## 2020-02-18 ENCOUNTER — Ambulatory Visit (HOSPITAL_COMMUNITY): Payer: Medicare Other | Attending: Cardiology

## 2020-02-18 ENCOUNTER — Other Ambulatory Visit: Payer: Self-pay

## 2020-02-18 DIAGNOSIS — J849 Interstitial pulmonary disease, unspecified: Secondary | ICD-10-CM | POA: Diagnosis not present

## 2020-02-18 DIAGNOSIS — I272 Pulmonary hypertension, unspecified: Secondary | ICD-10-CM

## 2020-02-18 DIAGNOSIS — R0602 Shortness of breath: Secondary | ICD-10-CM | POA: Diagnosis not present

## 2020-02-18 LAB — ECHOCARDIOGRAM COMPLETE
Area-P 1/2: 4.29 cm2
S' Lateral: 3.2 cm

## 2020-03-05 ENCOUNTER — Telehealth: Payer: Self-pay | Admitting: Pulmonary Disease

## 2020-03-05 NOTE — Telephone Encounter (Signed)
Spoke with the pt and gave her CT results:  CT shows stable inflammation on tung that has not changed much since 2019. Will discuss in detail at time of return visit  She wished to scheduled appt to discuss her condition in further detail  Appt was scheduled and nothing further needed per pt

## 2020-04-01 ENCOUNTER — Other Ambulatory Visit: Payer: Self-pay

## 2020-04-01 ENCOUNTER — Ambulatory Visit (INDEPENDENT_AMBULATORY_CARE_PROVIDER_SITE_OTHER): Payer: Medicare Other | Admitting: Pulmonary Disease

## 2020-04-01 ENCOUNTER — Encounter: Payer: Self-pay | Admitting: Pulmonary Disease

## 2020-04-01 VITALS — BP 126/66 | HR 110 | Temp 97.4°F | Ht 60.0 in | Wt 219.0 lb

## 2020-04-01 DIAGNOSIS — J849 Interstitial pulmonary disease, unspecified: Secondary | ICD-10-CM | POA: Diagnosis not present

## 2020-04-01 DIAGNOSIS — Z23 Encounter for immunization: Secondary | ICD-10-CM | POA: Diagnosis not present

## 2020-04-01 DIAGNOSIS — I2729 Other secondary pulmonary hypertension: Secondary | ICD-10-CM | POA: Diagnosis not present

## 2020-04-01 DIAGNOSIS — R768 Other specified abnormal immunological findings in serum: Secondary | ICD-10-CM

## 2020-04-01 NOTE — Patient Instructions (Signed)
I have reviewed your CT scan which shows mostly similar pattern of lung inflammation as before Continue supplemental oxygen  Schedule pulmonary function testing follow-up in 3 months.

## 2020-04-01 NOTE — Addendum Note (Signed)
Addended by: Elton Sin on: 04/01/2020 09:55 AM   Modules accepted: Orders

## 2020-04-01 NOTE — Addendum Note (Signed)
Addended by: Elton Sin on: 04/01/2020 10:10 AM   Modules accepted: Orders

## 2020-04-01 NOTE — Progress Notes (Signed)
Randall Rampersad    [id number]    1941-01-27  Primary Care Physician:John, Hunt Oris, MD  Referring Physician: Biagio Borg, MD [address] [place],  [place] [address]  Chief complaint: Consult for ILD  HPI: 79 year old with interstitial lung disease, hypertension, hyperlipidemia Followed by Dr. Halford Chessman for ILD, sleep Complains of chronic cough for several years.  States that she has significant dyspnea on exertion.  On 2 L supplemental oxygen She has been treated intermittently with steroids in the past.  Not on any standing therapy.  Developed COVID-19 in January 2021.  She did not require hospitalization Has history of OSA but is not using CPAP on a regular basis.   Pets: She had a cat until 2020, no birds Occupation: Retired Dance movement psychotherapist Exposures: No known exposure.  No mold, hot tub, Jacuzzi ILD questionnaire 01/30/2020-negative Smoking history: 10-pack-year smoker in her 21s Travel history: No significant travel history Relevant family history: No significant family issue of lung disease  Interim history: Here for follow-up of CT States that breathing is stable with chronic dyspnea on exertion Continues on supplemental oxygen  Outpatient Encounter Medications as of 04/01/2020  Medication Sig   albuterol (VENTOLIN HFA) 108 (90 Base) MCG/ACT inhaler Inhale 1-2 puffs into the lungs every 6 (six) hours as needed for wheezing or shortness of breath.   benzonatate (TESSALON) 100 MG capsule Take 1 capsule (100 mg total) by mouth every 8 (eight) hours.   cyanocobalamin (,VITAMIN B-12,) 1000 MCG/ML injection INJECT 1 ML IM EVERY 30 DAYS   fenofibrate micronized (LOFIBRA) 134 MG capsule 1 CAPSULE BY MOUTH ONCE DAILY BEFORE BREAKFAST   furosemide (LASIX) 40 MG tablet 1 tab by mouth in the AM, and 1 tab by mouth in the PM as needed for persistent swelling or weight gain more than 3-5 lbs   losartan (COZAAR) 100 MG tablet Take 0.5 tablets (50 mg total) by mouth  daily.   meloxicam (MOBIC) 15 MG tablet Take 1 tablet (15 mg total) by mouth daily.   metoprolol succinate (TOPROL-XL) 50 MG 24 hr tablet TAKE 1 TABLET BY MOUTH ONCE DAILY, TAKE WITH OR IMMEDIATELY FOLLOWING A MEAL   Respiratory Therapy Supplies (FLUTTER) DEVI Use as directed   triamcinolone cream (KENALOG) 0.5 % Apply 1 application topically 2 (two) times daily.   venlafaxine XR (EFFEXOR XR) 75 MG 24 hr capsule Take 3 capsules (225 mg total) by mouth daily with breakfast.   No facility-administered encounter medications on file as of 04/01/2020.   Physical Exam: Blood pressure 126/66, pulse (!) 110, temperature (!) 97.4 F (36.3 C), temperature source Skin, height 5' (1.524 m), weight 219 lb (99.3 kg), SpO2 95 %. Gen:      No acute distress HEENT:  EOMI, sclera anicteric Neck:     No masses; no thyromegaly Lungs:    Clear to auscultation bilaterally; normal respiratory effort CV:         Regular rate and rhythm; no murmurs Abd:      + bowel sounds; soft, non-tender; no palpable masses, no distension Ext:    No edema; adequate peripheral perfusion Skin:      Warm and dry; no rash Neuro: alert and oriented x 3 Psych: normal mood and affect  Data Reviewed: Imaging: HRCT 02/22/2017-patchy air trapping with reticulation, groundglass with traction bronchiectasis.  High-resolution CT scan 01/11/2018-prominent patchy airspace disease with out trapping, subpleural reticulation, groundglass with no basal gradient Findings suggestive of chronic HP with mild  worsening compared to 2018.  Dilated pulmonary artery  High-resolution CT 02/08/2020-findings suggestive of chronic HP, minimally progressed compared to 2019. I have reviewed the images personally.  PFTs: 03/14/2018 FVC 1.35 [60%], FEV1 1.27 [75%], F/F 94, DLCO 11.23 [59%] Mild restriction on spirometry, mild to moderate diffusion defect.  Worsened since 2018  Labs: CTD serologies 03/01/2017-negative ANA, CCP, rule,, RA, SSA,  SSB Hypersensitivity panel 03/15/2017 and 07/20/17-negative  Repeat CTD serologies ANA 1;320, positive SSA Hypersensitivity panel 01/30/2020-negative  N-terminal proBNP 02/13/2019-303  Cardiac: Echocardiogram 02/18/2020 LVEF 45-50% with global hypokinesis, mildly elevated pulmonary hypertension with RVSP 36  Assessment:  Interstitial lung disease CT has appearance of chronic HP with prominent air trapping although extensive history taking and ILD questionnaire does not reveal any significant exposures.  Positive ANA and SSA may indicate an autoimmune process. She also had COVID-19 in January which may have muddied the water and cause progression of baseline ILD.  Don't feel she would toleate lung biopsy.  Can consider just a BAL to evaluate cell count to check for lymphocytosis but she would like to avoid any invasive procedures Repeat PFTs are pending  Cardiomyopathy with pulmonary hypertension Mild atrial fibrillation with RVR Has been referred to cardiology  Plan/Recommendations: Rheumatology referral Pulmonary function test Awaiting cardiology evaluation.  Marshell Garfinkel MD [facility] 04/01/2020, 9:22 AM  CC: Biagio Borg, MD

## 2020-04-04 ENCOUNTER — Other Ambulatory Visit: Payer: Self-pay

## 2020-04-04 ENCOUNTER — Ambulatory Visit (INDEPENDENT_AMBULATORY_CARE_PROVIDER_SITE_OTHER): Payer: Medicare Other | Admitting: Pulmonary Disease

## 2020-04-04 DIAGNOSIS — J849 Interstitial pulmonary disease, unspecified: Secondary | ICD-10-CM | POA: Diagnosis not present

## 2020-04-04 LAB — PULMONARY FUNCTION TEST
DL/VA % pred: 89 %
DL/VA: 3.76 ml/min/mmHg/L
DLCO cor % pred: 52 %
DLCO cor: 8.68 ml/min/mmHg
DLCO unc % pred: 52 %
DLCO unc: 8.68 ml/min/mmHg
FEF 25-75 Post: 3.36 L/sec
FEF 25-75 Pre: 2.69 L/sec
FEF2575-%Change-Post: 24 %
FEF2575-%Pred-Post: 271 %
FEF2575-%Pred-Pre: 217 %
FEV1-%Change-Post: 4 %
FEV1-%Pred-Post: 79 %
FEV1-%Pred-Pre: 76 %
FEV1-Post: 1.28 L
FEV1-Pre: 1.23 L
FEV1FVC-%Change-Post: 2 %
FEV1FVC-%Pred-Pre: 127 %
FEV6-%Change-Post: 2 %
FEV6-%Pred-Post: 64 %
FEV6-%Pred-Pre: 63 %
FEV6-Post: 1.33 L
FEV6-Pre: 1.29 L
FEV6FVC-%Pred-Post: 106 %
FEV6FVC-%Pred-Pre: 106 %
FVC-%Change-Post: 2 %
FVC-%Pred-Post: 61 %
FVC-%Pred-Pre: 59 %
FVC-Post: 1.33 L
FVC-Pre: 1.3 L
Post FEV1/FVC ratio: 96 %
Post FEV6/FVC ratio: 100 %
Pre FEV1/FVC ratio: 94 %
Pre FEV6/FVC Ratio: 100 %
RV % pred: 47 %
RV: 1.04 L
TLC % pred: 55 %
TLC: 2.49 L

## 2020-04-04 NOTE — Progress Notes (Signed)
PFT done today. 

## 2020-04-17 ENCOUNTER — Other Ambulatory Visit: Payer: Self-pay

## 2020-04-18 ENCOUNTER — Telehealth (INDEPENDENT_AMBULATORY_CARE_PROVIDER_SITE_OTHER): Payer: Medicare Other | Admitting: Internal Medicine

## 2020-04-18 ENCOUNTER — Encounter: Payer: Self-pay | Admitting: Internal Medicine

## 2020-04-18 DIAGNOSIS — K219 Gastro-esophageal reflux disease without esophagitis: Secondary | ICD-10-CM | POA: Diagnosis not present

## 2020-04-18 DIAGNOSIS — R739 Hyperglycemia, unspecified: Secondary | ICD-10-CM

## 2020-04-18 DIAGNOSIS — E559 Vitamin D deficiency, unspecified: Secondary | ICD-10-CM | POA: Diagnosis not present

## 2020-04-18 DIAGNOSIS — R1030 Lower abdominal pain, unspecified: Secondary | ICD-10-CM

## 2020-04-18 DIAGNOSIS — I700 Atherosclerosis of aorta: Secondary | ICD-10-CM | POA: Diagnosis not present

## 2020-04-18 MED ORDER — SULFAMETHOXAZOLE-TRIMETHOPRIM 800-160 MG PO TABS: 1.0000 | ORAL_TABLET | Freq: Two times a day (BID) | ORAL | 0 refills | Status: DC

## 2020-04-18 MED ORDER — PANTOPRAZOLE SODIUM 40 MG PO TBEC: 40.0000 mg | DELAYED_RELEASE_TABLET | Freq: Every day | ORAL | 3 refills | Status: DC

## 2020-04-18 NOTE — Progress Notes (Signed)
Patient ID: Kathy [name], female   DOB: 1941-02-28, 79 y.o.   MRN: [id number]  Virtual Visit via Video Note  I connected with Kathy [name] on 04/18/20 at 10:20 AM EDT by a video enabled telemedicine application and verified that I am speaking with the correct person using two identifiers.  Location of all participants today Patient: at home on her home o2 Provider: at office   I discussed the limitations of evaluation and management by telemedicine and the availability of in person appointments. The patient expressed understanding and agreed to proceed.  History of Present Illness: Here to f/u with c/o 1 mo lower abd pain, dull. Intermittent, mild, without radiation, fever, chills, and Denies worsening reflux,  dysphagia, n/v/c/d, bowel change or blood.  Pt states she "just feels sick" but o/w cant be more specific.  Discomfort is incidentally somewhat better the last 2 days.  Does have mild urinary frequency in the past wk however with out flank pain or hematuria, and hx of UTI.  Also mentions overall losing wt for unclear reasons except appetite seems less, may have been about 249 lbs in jan 2021, now wt is 213 at home today.  Pt is taking vit d supplement. Wt Readings from Last 3 Encounters:  04/01/20 219 lb (99.3 kg)  01/30/20 226 lb 12.8 oz (102.9 kg)  07/11/19 249 lb (112.9 kg)   Past Medical History:  Diagnosis Date  . Arthritis    fingers  . CKD (chronic kidney disease) stage 3, GFR 30-59 ml/min (HCC) 11/30/2017  . Depression   . Dizziness    in AM, getting out of bed  . Dysrhythmia    "skips a beat" sometimes - followed by PCP  . Endometrial ca ([place]) 11/30/2017   S/p surgury 1990's  . GERD (gastroesophageal reflux disease) 11/30/2017  . HLD (hyperlipidemia) 11/30/2017  . Hypercholesteremia   . Hypertension   . Interstitial lung disease ([place])   . Neuropathy    bilateral feet  . Shortness of breath dyspnea   . Sleep apnea    has CPAP, doesn't use  . Umbilical hernia    Past  Surgical History:  Procedure Laterality Date  . ABDOMINAL HYSTERECTOMY    . BROW LIFT Bilateral 07/15/2015   Procedure: BLEPHAROPLASTY;  Surgeon: Karle Starch, MD;  Location: [facility];  Service: Ophthalmology;  Laterality: Bilateral;  . CHOLECYSTECTOMY    . HAMMER TOE SURGERY    . HERNIA REPAIR    . KNEE ARTHROSCOPY Bilateral   . PTOSIS REPAIR Bilateral 07/15/2015   Procedure: PTOSIS REPAIR;  Surgeon: Karle Starch, MD;  Location: [facility];  Service: Ophthalmology;  Laterality: Bilateral;  CPAP  . TONSILLECTOMY      reports that she quit smoking about 33 years ago. Her smoking use included cigarettes. She has a 10.00 pack-year smoking history. She has never used smokeless tobacco. She reports that she does not drink alcohol and does not use drugs. family history includes Congestive Heart Failure in her mother; Parkinson's disease in her father; Stroke in her father. Allergies  Allergen Reactions  . Lipitor [Atorvastatin] Other (See Comments)    Memory issues  . Requip [Ropinirole Hcl] Other (See Comments)    Pt reports feeling generally unwell on this medication   Current Outpatient Medications on File Prior to Visit  Medication Sig Dispense Refill  . albuterol (VENTOLIN HFA) 108 (90 Base) MCG/ACT inhaler Inhale 1-2 puffs into the lungs every 6 (six) hours as needed for wheezing or shortness  of breath. 1 Inhaler 11  . benzonatate (TESSALON) 100 MG capsule Take 1 capsule (100 mg total) by mouth every 8 (eight) hours. 21 capsule 0  . cyanocobalamin (,VITAMIN B-12,) 1000 MCG/ML injection INJECT 1 ML IM EVERY 30 DAYS 3 mL 1  . fenofibrate micronized (LOFIBRA) 134 MG capsule 1 CAPSULE BY MOUTH ONCE DAILY BEFORE BREAKFAST 90 capsule 2  . furosemide (LASIX) 40 MG tablet 1 tab by mouth in the AM, and 1 tab by mouth in the PM as needed for persistent swelling or weight gain more than 3-5 lbs 60 tablet 11  . losartan (COZAAR) 100 MG tablet Take 0.5 tablets (50 mg total) by mouth  daily. 45 tablet 3  . meloxicam (MOBIC) 15 MG tablet Take 1 tablet (15 mg total) by mouth daily. 90 tablet 3  . metoprolol succinate (TOPROL-XL) 50 MG 24 hr tablet TAKE 1 TABLET BY MOUTH ONCE DAILY, TAKE WITH OR IMMEDIATELY FOLLOWING A MEAL 90 tablet 2  . Respiratory Therapy Supplies (FLUTTER) DEVI Use as directed 1 each 0  . triamcinolone cream (KENALOG) 0.5 % Apply 1 application topically 2 (two) times daily. 30 g 1  . venlafaxine XR (EFFEXOR XR) 75 MG 24 hr capsule Take 3 capsules (225 mg total) by mouth daily with breakfast. 270 capsule 3   No current facility-administered medications on file prior to visit.   Observations/Objective: Alert, NAD, appropriate mood and affect, resps normal, cn 2-12 intact, moves all 4s, no visible rash or swelling Lab Results  Component Value Date   WBC 6.3 12/18/2019   HGB 11.4 (L) 12/18/2019   HCT 34.0 (L) 12/18/2019   PLT 216.0 12/18/2019   GLUCOSE 94 12/18/2019   CHOL 130 12/18/2019   TRIG 74.0 12/18/2019   HDL 47.40 12/18/2019   LDLCALC 68 12/18/2019   ALT 22 12/18/2019   AST 37 12/18/2019   NA 138 12/18/2019   K 4.0 12/18/2019   CL 100 12/18/2019   CREATININE 1.19 12/18/2019   BUN 24 (H) 12/18/2019   CO2 30 12/18/2019   TSH 2.46 12/18/2019   HGBA1C 5.6 12/18/2019   Assessment and Plan: See notes  Follow Up Instructions: See notes   I discussed the assessment and treatment plan with the patient. The patient was provided an opportunity to ask questions and all were answered. The patient agreed with the plan and demonstrated an understanding of the instructions.   The patient was advised to call back or seek an in-person evaluation if the symptoms worsen or if the condition fails to improve as anticipated.   Cathlean Cower, MD

## 2020-04-20 ENCOUNTER — Encounter: Payer: Self-pay | Admitting: Internal Medicine

## 2020-04-20 NOTE — Assessment & Plan Note (Signed)
D/w pt, has been statin intolerant in past, to follow lower chol diet, cont to modify cardiac risk factors

## 2020-04-20 NOTE — Assessment & Plan Note (Signed)
stable overall by history and exam, recent data reviewed with pt, and pt to continue medical treatment as before,  to f/u any worsening symptoms or concerns  

## 2020-04-20 NOTE — Assessment & Plan Note (Addendum)
Lower mid abd, possible UTI vs other, pt declines leaving the home due to covid and home o2, declines labs, CT, will try empiric antibx today   I spent 41 minutes in preparing to see the patient by review of recent labs, imaging and procedures, obtaining and reviewing separately obtained history, communicating with the patient and family or caregiver, ordering medications, tests or procedures, and documenting clinical information in the EHR including the differential Dx, treatment, and any further evaluation and other management of lower abd pain, vit d def, hyperglycemia, wt loss, gerd, and aortic atherosclerosis

## 2020-04-20 NOTE — Patient Instructions (Signed)
Please take all new medication as prescribed 

## 2020-04-20 NOTE — Assessment & Plan Note (Signed)
To continue oral replacement

## 2020-04-20 NOTE — Assessment & Plan Note (Signed)
Also for empiric protonix given wt loss, declines GI referral today

## 2020-05-01 ENCOUNTER — Ambulatory Visit (INDEPENDENT_AMBULATORY_CARE_PROVIDER_SITE_OTHER): Payer: Medicare Other | Admitting: Internal Medicine

## 2020-05-01 ENCOUNTER — Encounter: Payer: Self-pay | Admitting: Internal Medicine

## 2020-05-01 ENCOUNTER — Other Ambulatory Visit: Payer: Self-pay

## 2020-05-01 VITALS — BP 108/79 | HR 109 | Ht 60.0 in | Wt 222.4 lb

## 2020-05-01 DIAGNOSIS — M19041 Primary osteoarthritis, right hand: Secondary | ICD-10-CM | POA: Diagnosis not present

## 2020-05-01 DIAGNOSIS — M19042 Primary osteoarthritis, left hand: Secondary | ICD-10-CM | POA: Diagnosis not present

## 2020-05-01 DIAGNOSIS — R7689 Other specified abnormal immunological findings in serum: Secondary | ICD-10-CM | POA: Insufficient documentation

## 2020-05-01 DIAGNOSIS — M170 Bilateral primary osteoarthritis of knee: Secondary | ICD-10-CM

## 2020-05-01 DIAGNOSIS — M1990 Unspecified osteoarthritis, unspecified site: Secondary | ICD-10-CM | POA: Insufficient documentation

## 2020-05-01 DIAGNOSIS — R768 Other specified abnormal immunological findings in serum: Secondary | ICD-10-CM | POA: Diagnosis not present

## 2020-05-01 DIAGNOSIS — R5381 Other malaise: Secondary | ICD-10-CM

## 2020-05-01 NOTE — Progress Notes (Signed)
Office Visit Note  Patient: Kathy [name]             Date of Birth: Jul 08, 1940           MRN: [id number]             PCP: Biagio Borg, MD Referring: Marshell Garfinkel, MD Visit Date: 05/01/2020   Subjective:  Pain of the Right Knee, Pain of the Left Knee, Pain and Edema of the Right Hand, Pain and Edema of the Left Hand, and New Patient (Initial Visit)   History of Present Illness: Kathy [name] is a 79 y.o. 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  She has a chronic history with ILD with chronic cough dyspnea on exertion.  She has had intermittent steroid treatments but not long-term maintenance therapy.  Previous chest CT imaging suggested NSIP pattern of ILD with recent images read as concerning for hypersensitivity pneumonitis but had negative laboratory testing for this.  She does have a chronic productive cough without recent change in this.  She is currently on 2 L/min supplemental oxygen.  She did develop Covid in January 2021 without severe disease.  Additional lab work-up included positive ANA and SSA test. Besides her pulmonary complaints she does also report joint pain primarily in the right knee.  She has some symptoms in the left knee but mostly describes a weakness in this leg rather than pain.  She is also noticed increased twisting and deformity of the fingers of both hands that is mildly painful but not associated with any swelling or redness.  She does have left shoulder pain most bothersome lying in bed to sleep.  She denies any significant complaints of dry eyes or dry mouth.  On review also denies lymphadenopathy, fevers, photosensitive skin rash, blood clots, Raynaud's phenomenon, seizures, or history of pancreatitis.  Labs reviewed 01/2020 ANA 1:320 cytoplasmic Scl-70  negative ANCA screen negative SSA negative SSB negative MM3 Panel - SSA 52 kDa 29  RF negative CCP negative  01/2020 CT Chest w/o Contast 1. 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 Findings may be minimally progressive from 01/11/2018 and the possibility of superimposed post COVID-19 inflammatory fibrosis cannot be excluded. Findings are suggestive of an alternative diagnosis (not UIP) per consensus guidelines: Diagnosis of Idiopathic Pulmonary Fibrosis: An Official ATS/ERS/JRS/ALAT Clinical Practice Guideline. [place], Iss 5, [phone], Feb 26 2017.  Activities of Daily Living:  Patient reports morning stiffness for 5 minutes.   Patient Reports nocturnal pain.  Difficulty dressing/grooming: Denies Difficulty climbing stairs: Reports Difficulty getting out of chair: Reports Difficulty using hands for taps, buttons, cutlery, and/or writing: Reports  Review of Systems  Constitutional: Positive for fatigue.  HENT: Negative for mouth sores, mouth dryness and nose dryness.   Eyes: Positive for itching. Negative for pain, visual disturbance and dryness.  Respiratory: Positive for cough. Negative for hemoptysis, shortness of breath and difficulty breathing.   Cardiovascular: Negative for chest pain, palpitations and swelling in legs/feet.  Gastrointestinal: Positive for diarrhea. Negative for abdominal pain, blood in stool and constipation.  Endocrine: Negative for increased urination.  Genitourinary: Negative for painful urination.  Musculoskeletal: Positive for arthralgias, joint pain, joint swelling and morning stiffness. Negative for myalgias, muscle weakness, muscle tenderness and myalgias.  Skin: Positive for color  change. Negative for rash and redness.  Allergic/Immunologic: Negative for susceptible to infections.  Neurological: Positive for dizziness and memory loss. Negative for numbness,  headaches and weakness.  Hematological: Negative for swollen glands.  Psychiatric/Behavioral: Positive for sleep disturbance. Negative for confusion.    PMFS History:  Patient Active Problem List   Diagnosis Date Noted  . Positive ANA (antinuclear antibody) 05/01/2020  . Osteoarthritis 05/01/2020  . Bilateral primary osteoarthritis of knee 05/01/2020  . Aortic atherosclerosis ([place]) 04/18/2020  . COVID-19 virus infection 07/31/2019  . Bilateral knee pain 06/10/2019  . Leg swelling 02/13/2019  . 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 ([place]) 51/88/4166  . Physical deconditioning 02/13/2019  . Vitamin D deficiency 12/04/2018  . B12 deficiency 12/04/2018  . Hyperglycemia 11/16/2018  . Abdominal pain 06/02/2018  . Acute on chronic kidney failure ([place]) 12/12/2017  . Diastolic dysfunction 12/26/1599  . Endometrial ca ([place]) 11/30/2017  . GERD (gastroesophageal reflux disease) 11/30/2017  . HLD (hyperlipidemia) 11/30/2017  . CKD (chronic kidney disease) stage 3, GFR 30-59 ml/min (HCC) 11/30/2017  . Dyspnea on exertion 11/07/2017  . Cough 11/07/2017  . ILD (interstitial lung disease) ([place]) 03/15/2017  . Chronic respiratory failure with hypoxia ([place]) 03/15/2017  . Oral herpes simplex infection   . Influenza with pneumonia 06/28/2016  . Community acquired pneumonia 06/27/2016  . Hypertension 06/27/2016  . Depression 06/27/2016  . Sleep apnea 06/27/2016    Past Medical History:  Diagnosis Date  . Arthritis    fingers  . CKD (chronic kidney disease) stage 3, GFR 30-59 ml/min (HCC) 11/30/2017  . Depression   . Dizziness    in AM, getting out of bed  . Dysrhythmia    "skips a beat" sometimes - followed by PCP  . Endometrial ca ([place]) 11/30/2017   S/p surgury 1990's  . GERD (gastroesophageal reflux disease) 11/30/2017  . HLD (hyperlipidemia) 11/30/2017  . Hypercholesteremia   . Hypertension   . Interstitial lung disease ([place])   . Neuropathy    bilateral feet  . Shortness of breath dyspnea    . Sleep apnea    has CPAP, doesn't use  . Umbilical hernia     Family History  Problem Relation Age of Onset  . Congestive Heart Failure Mother   . Stroke Father   . Parkinson's disease Father   . Diabetes Son    Past Surgical History:  Procedure Laterality Date  . ABDOMINAL HYSTERECTOMY    . BROW LIFT Bilateral 07/15/2015   Procedure: BLEPHAROPLASTY;  Surgeon: Karle Starch, MD;  Location: [facility];  Service: Ophthalmology;  Laterality: Bilateral;  . CHOLECYSTECTOMY    . HAMMER TOE SURGERY    . HERNIA REPAIR    . KNEE ARTHROSCOPY Bilateral   . PTOSIS REPAIR Bilateral 07/15/2015   Procedure: PTOSIS REPAIR;  Surgeon: Karle Starch, MD;  Location: [facility];  Service: Ophthalmology;  Laterality: Bilateral;  CPAP  . TONSILLECTOMY     Social History   Social History Narrative  . Not on file   Immunization History  Administered Date(s) Administered  . Fluad Quad(high Dose 65+) 06/08/2019, 04/01/2020  . Influenza, High Dose Seasonal PF 04/04/2017, 03/14/2018  . PFIZER SARS-COV-2 Vaccination 10/04/2019, 10/25/2019, 04/17/2020  . Pneumococcal Conjugate-13 11/30/2017  . Pneumococcal Polysaccharide-23 06/30/2016  . Tdap 11/30/2017     Objective: Vital Signs: BP 108/79 (BP Location: Left Arm, Patient Position: Sitting, Cuff Size: Small)   Pulse (!) 109   Ht 5' (1.524 m)   Wt 222 lb 6.4 oz (100.9 kg)  BMI 43.43 kg/m    Physical Exam HENT:     Head: Normocephalic.     Right Ear: External ear normal.     Left Ear: External ear normal.     Mouth/Throat:     Mouth: Mucous membranes are moist.     Pharynx: Oropharynx is clear.  Eyes:     Conjunctiva/sclera: Conjunctivae normal.  Cardiovascular:     Rate and Rhythm: Regular rhythm. Tachycardia present.  Pulmonary:     Comments: On supplemental oxygen by nasal cannula, bibasilar inspiratory crackles with good air movement Skin:    General: Skin is warm and dry.     Findings: No rash.  Neurological:      Mental Status: She is alert.     Comments: Left hip flexion and knee extension 4 out of 5 strength otherwise normal throughout     Musculoskeletal Exam:  Neck full range of motion no tenderness Shoulder, elbow, wrist, fingers full range of motion no tenderness or swelling, rotations and irregularity in PIP and DIP joints on both hands, right 1st MCP some hyperextension No paraspinal tenderness to palpation over upper and lower back Normal hip internal and external rotation without pain, no tenderness to lateral hip palpation Knees extension range of motion slightly reduced bilaterally, no warmth or tenderness to touch, no obvious effusion, well-healed scar overlying right knee Ankles no swelling normal range of motion, first MTP is significant lateral deviation but no swelling warmth or redness  CDAI Exam: CDAI Score: -- Patient Global: --; Provider Global: -- Swollen: --; Tender: -- Joint Exam 05/01/2020   No joint exam has been documented for this visit   There is currently no information documented on the homunculus. Go to the Rheumatology activity and complete the homunculus joint exam.  Investigation: No additional findings.  Imaging: No results found.  Recent Labs: Lab Results  Component Value Date   WBC 6.3 12/18/2019   HGB 11.4 (L) 12/18/2019   PLT 216.0 12/18/2019   NA 138 12/18/2019   K 4.0 12/18/2019   CL 100 12/18/2019   CO2 30 12/18/2019   GLUCOSE 94 12/18/2019   BUN 24 (H) 12/18/2019   CREATININE 1.19 12/18/2019   BILITOT 0.7 12/18/2019   ALKPHOS 41 12/18/2019   AST 37 12/18/2019   ALT 22 12/18/2019   PROT 7.3 12/18/2019   ALBUMIN 3.9 12/18/2019   CALCIUM 9.7 12/18/2019   GFRAA 44 (L) 05/23/2018    Speciality Comments: No specialty comments available.  Procedures:  No procedures performed Allergies: Lipitor [atorvastatin] and Requip [ropinirole hcl]   Assessment / Plan:     Visit Diagnoses: ANA positive, SS-A antibody positive  Physical exam  and history today do not demonstrate clinical findings for systemic connective tissue disease.  She did have significantly positive ANA with cytoplasmic pattern and also 52 kDa SSA.  She has no specific features suggestive for Sjogren's syndrome.  Does not meet clinical criteria of systemic lupus.  She has some proximal left leg weakness that is gotten worse in a month or 2 but this seems more attributed to her deconditioning and osteoarthritis, inflammatory myositis would typically be bilateral symmetric.  Her joint pain is limited to only a few sites these are not associated with swelling or objective inflammatory changes and there is osteoarthritis present as well as an alternate cause of the pain. Based on this I do not recommend any further specific work-up for systemic autoimmune disease at this time.  Primary osteoarthritis of both hands Bilateral primary  osteoarthritis of knee - Plan: Ambulatory referral to Physical Therapy  Discussed left leg weakness in the bilateral knee arthritis she is not having any falls but does have some activity limitation especially anything that require climbing steps.  I do not see anything obviously suggesting myositis or neurologic cause so she could benefit with physical therapy evaluation to recommend some strengthening or whether she needs an assistive device for unstable gait.  She agrees with referral to PT at this time.   Orders: Orders Placed This Encounter  Procedures  . Ambulatory referral to Physical Therapy   No orders of the defined types were placed in this encounter.   Follow-Up Instructions: No follow-ups on file.   Collier Salina, MD  Note - This record has been created using [employer].  Chart creation errors have been sought, but may not always  have been located. Such creation errors do not reflect on  the standard of medical care.

## 2020-05-20 ENCOUNTER — Ambulatory Visit: Payer: Medicare Other | Attending: Internal Medicine

## 2020-05-20 ENCOUNTER — Other Ambulatory Visit: Payer: Self-pay

## 2020-05-20 DIAGNOSIS — M6281 Muscle weakness (generalized): Secondary | ICD-10-CM | POA: Diagnosis not present

## 2020-05-20 DIAGNOSIS — R262 Difficulty in walking, not elsewhere classified: Secondary | ICD-10-CM | POA: Diagnosis not present

## 2020-05-20 DIAGNOSIS — G8929 Other chronic pain: Secondary | ICD-10-CM | POA: Insufficient documentation

## 2020-05-20 DIAGNOSIS — M25662 Stiffness of left knee, not elsewhere classified: Secondary | ICD-10-CM | POA: Diagnosis not present

## 2020-05-20 DIAGNOSIS — M25561 Pain in right knee: Secondary | ICD-10-CM | POA: Diagnosis not present

## 2020-05-20 DIAGNOSIS — M25562 Pain in left knee: Secondary | ICD-10-CM | POA: Insufficient documentation

## 2020-05-20 NOTE — Therapy (Signed)
[place], [place], [address] Phone: [phone]   Fax:  [phone]  Physical Therapy Evaluation  Patient Details  Name: Kathy [name] MRN: [id number] Date of Birth: 19-Dec-1940 Referring Provider (PT):  Collier Salina, MD   Encounter Date: 05/20/2020   PT End of Session - 05/20/20 1036    Visit Number 1    Number of Visits 9    Date for PT Re-Evaluation 07/19/20    Authorization Type Medicare - FOTO at visit 6 and visit 10; PN visit 10; KX modifier visit 15    PT Start Time 1040    PT Stop Time 1125    PT Time Calculation (min) 45 min    Activity Tolerance Patient tolerated treatment well    Behavior During Therapy [facility] for tasks assessed/performed           Past Medical History:  Diagnosis Date  . Arthritis    fingers  . CKD (chronic kidney disease) stage 3, GFR 30-59 ml/min (HCC) 11/30/2017  . Depression   . Dizziness    in AM, getting out of bed  . Dysrhythmia    "skips a beat" sometimes - followed by PCP  . Endometrial ca ([place]) 11/30/2017   S/p surgury 1990's  . GERD (gastroesophageal reflux disease) 11/30/2017  . HLD (hyperlipidemia) 11/30/2017  . Hypercholesteremia   . Hypertension   . Interstitial lung disease ([place])   . Neuropathy    bilateral feet  . Shortness of breath dyspnea   . Sleep apnea    has CPAP, doesn't use  . Umbilical hernia     Past Surgical History:  Procedure Laterality Date  . ABDOMINAL HYSTERECTOMY    . BROW LIFT Bilateral 07/15/2015   Procedure: BLEPHAROPLASTY;  Surgeon: Karle Starch, MD;  Location: [place];  Service: Ophthalmology;  Laterality: Bilateral;  . CHOLECYSTECTOMY    . HAMMER TOE SURGERY    . HERNIA REPAIR    . KNEE ARTHROSCOPY Bilateral   . PTOSIS REPAIR Bilateral 07/15/2015   Procedure: PTOSIS REPAIR;  Surgeon: Karle Starch, MD;  Location: [place];  Service: Ophthalmology;  Laterality: Bilateral;  CPAP  . TONSILLECTOMY       There were no vitals filed for this visit.    Subjective Assessment - 05/20/20 1044    Subjective "I was on a chair changing curtains, and my knee gave way back in 1976. To start with, they couldn't find anything wrong with it until they did an exploratory then he said it was tore up worse than he's ever seen it. Since then, it's always been an issue. I can't do a lot of steps. My left knee hurts too, sometimes worse than the right. If I go up steps, I have to go up with my right leg first even though the right is more worse knee usually." Pt reports not having any knee pain over the last month.    Pertinent History see extensive PMH; hx of bilateral knee arthroscopy    Limitations Sitting;Walking;Standing;Lifting;House hold activities    How long can you sit comfortably? Always keep feet elevated when sitting; couldn't say how long she can sit comfortably    How long can you stand comfortably? Hard to say but not very long    How long can you walk comfortably? "From here to the front door"    Diagnostic tests Imagining 06/22/2016: 1. Severe tricompartment osteoarthritis.2. No acute findings.    Patient Stated Goals  Be able to stand long enough to make banana pudding; be able to go up/down steps without having to only go up with RLE first    Currently in Pain? No/denies              [facility] PT Assessment - 05/20/20 0001      Assessment   Medical Diagnosis Bilateral primary osteoarthritis of knee M17.0    Referring Provider (PT)  Collier Salina, MD    Onset Date/Surgical Date --   March 1976   Hand Dominance Right    Next MD Visit Nothing scheduled currently    Prior Therapy Long time ago around onset of pain (1976)      Precautions   Precautions None      Restrictions   Weight Bearing Restrictions No      Balance Screen   Has the patient fallen in the past 6 months No    Has the patient had a decrease in activity level because of a fear of falling?  Yes    Is the  patient reluctant to leave their home because of a fear of falling?  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      Cognition   Overall Cognitive Status Within Functional Limits for tasks assessed      Observation/Other Assessments   Focus on Therapeutic Outcomes (FOTO)  62% limited; predicted 49% limitation      Sensation   Light Touch Appears Intact      Posture/Postural Control   Posture Comments Slouched sitting posture with forward head and rounded shoulders      ROM / Strength   AROM / PROM / Strength AROM;Strength      AROM   Overall AROM Comments Most BLE AROM WFL but pt has < 1/2 full L knee EXT AROM    AROM Assessment Site Knee      Strength   Overall Strength Comments L knee EXT: unable to fully extend from seated position; weakness and feels pulling at L popliteal region    Strength Assessment Site Hip;Knee;Ankle    Right/Left Hip Right;Left    Right Hip Flexion 4-/5    Right Hip ABduction 4-/5    Right Hip ADduction 4-/5    Left Hip Flexion 3+/5    Left Hip ABduction 3+/5    Left Hip ADduction 3+/5    Right/Left Knee Right;Left    Right Knee Flexion 4-/5    Right Knee Extension 4-/5    Left Knee Flexion 3+/5    Left Knee Extension 3-/5    Right/Left Ankle Right;Left    Right Ankle Dorsiflexion 4/5    Right Ankle Plantar Flexion 4/5    modified test in sitting   Left Ankle Dorsiflexion 4/5  Left Ankle Plantar Flexion 4/5   modified test in sitting     Ambulation/Gait   Ambulation/Gait Yes    Ambulation/Gait Assistance 4: Min assist    Ambulation/Gait Assistance Details Pt requires R handheld assist during ambulation    Ambulation Distance (Feet) 150 Feet   to/from and within therapy gym (~150 feet)   Assistive device 1 person hand held assist   R side hand held assist   Gait Comments Pt's husband provided handheld assist into and out of clinic building. PT provided handheld assist to/from and within therapy gym      Standardized Balance Assessment   Standardized Balance Assessment Timed Up and Go Test      Timed Up and Go Test   TUG Normal TUG    Normal TUG (seconds) 27.27    TUG Comments No assistive device; PT managed portable O2; no handheld assist during TUG but PT providing SBA; SpO2 96% and HR 132 bpm      High Level Balance   High Level Balance Comments Attempted standing narrow BOS EO and EC, tandem standing with each LE in front, and SLS but pt unable to hold any positions without R handheld assist and demonstrates instability.                      Objective measurements completed on examination: See above findings.       [facility] PT Treatment/Exercise - 05/20/20 0001      Exercises   Exercises Knee/Hip      Knee/Hip Exercises: Standing   Hip Abduction Stengthening;Both;10 reps      Knee/Hip Exercises: Seated   Long Arc Quad Strengthening;Both;10 reps      Knee/Hip Exercises: Supine   Straight Leg Raises Strengthening;Both;10 reps                    PT Short Term Goals - 05/20/20 1421      PT SHORT TERM GOAL #1   Title Patient will be independent with initial HEP.    Baseline Patient provided with initial HEP at evaluation 05/20/2020.    Time 4    Period Weeks    Status New    Target Date 06/17/20      PT SHORT TERM GOAL #2   Title Patient will be able to  ambulate safely with CGA using SPC indoors on level ground for 25 feet.    Baseline Pt currently requires R handheld assist to maintain balance during ambulation. Uses rollator for community ambulation but did not bring it to PT eval.    Time 4    Period Weeks    Status New    Target Date 06/17/20      PT SHORT TERM GOAL #3   Title Pt's FOTO score will improve from 62% limitation to 58% limitation or better to demonstrate increased tolerance with functional activities.    Baseline 62% limited; predicted 49% limitation    Time 4    Period Weeks    Status New    Target Date 06/17/20      PT SHORT TERM GOAL #4   Title Pt will be able to stand for at least 15 minutes without requiring seated rest break to allow for meal preparation.    Baseline Pt reports she is unable to stand long enough to mix ingredients for banana pudding.    Time 4    Period Weeks    Status New    Target  Date 06/17/20             PT Long Term Goals - 05/20/20 1437      PT LONG TERM GOAL #1   Title Pt will be independent with advanced HEP.    Baseline Patient provided with initial HEP at evaluation 05/20/2020.    Time 8    Period Weeks    Status New    Target Date 07/15/20      PT LONG TERM GOAL #2   Title Patient will be able to ambulate safely with CGA using SPC or LRAD indoors on level ground for 150 feet.    Baseline Pt currently requires R handheld assist to maintain balance during ambulation. Uses rollator for community ambulation but did not bring it to PT eval.    Time 8    Period Weeks    Status New    Target Date 07/15/20      PT LONG TERM GOAL #3   Title Pt's FOTO score will improve from 62% limitation to 50% limitation or better to demonstrate increased tolerance with functional activities.    Baseline 62% limited; predicted 49% limitation    Time 8    Period Weeks    Status New    Target Date 07/15/20      PT LONG TERM GOAL #4   Title Pt will be able to stand for at least 40  minutes without requiring seated rest break to allow for meal preparation.    Baseline Pt reports she is unable to stand long enough to mix ingredients for banana pudding.    Time 8    Period Weeks    Status New    Target Date 07/15/20      PT LONG TERM GOAL #5   Title Pt's TUG time will improve from 27.27 seconds with no AD to < 25 seconds with LRAD.    Baseline 27.27 seconds with PT managing portable O2    Time 8    Period Weeks    Status New    Target Date 07/15/20                  Plan - 05/20/20 1101    Clinical Impression Statement Patient is a 79 year old female who presents to OPPT with complaints of bilateral knee pain that began in 1976 but reports being pain-free for the past month. Imaging of L knee on 06/22/2016 revealed severe tricompartment osteoarthritis. Per referral, patient has primary osteoarthritis in bilateral knees. Pt presents with BLE weakness with increased weakness of LLE compared to RLE. Pt should benefit from skilled PT intervention to increase BLE strength and balance to improve functional mobility and safety during ambulation.    Personal Factors and Comorbidities Age;Comorbidity 1;Comorbidity 2;Comorbidity 3+;Time since onset of injury/illness/exacerbation    Comorbidities see extensive PMH above    Examination-Activity Limitations Locomotion Level;Sit;Squat;Stairs;Stand;Transfers;Toileting;Lift;Bend;Bed Mobility;Bathing    Examination-Participation Restrictions Cleaning;Community Activity;Shop;Meal Prep    Stability/Clinical Decision Making Stable/Uncomplicated    Clinical Decision Making Low    Rehab Potential Fair    PT Frequency 1x / week    PT Duration 8 weeks    PT Treatment/Interventions ADLs/Self Care Home Management;Aquatic Therapy;Electrical Stimulation;Cryotherapy;Iontophoresis 4mg /ml Dexamethasone;Moist Heat;DME Instruction;Neuromuscular re-education;Balance training;Therapeutic exercise;Therapeutic activities;Functional mobility  training;Stair training;Gait training;Patient/family education;Manual techniques;Dry needling;Energy conservation;Passive range of motion;Taping    PT Next Visit Plan Review and update HEP PRN, 5xSTS, gait training with SPC and/or RW, BLE strengthening, consider aquatic therapy    PT Home Exercise  Plan [id number] - LAQ, SLR, hip ABD    Consulted and Agree with Plan of Care Patient           Patient will benefit from skilled therapeutic intervention in order to improve the following deficits and impairments:  Difficulty walking, Decreased range of motion, Decreased activity tolerance, Decreased balance, Decreased strength, Decreased mobility, Pain, Obesity, Improper body mechanics, Decreased endurance  Visit Diagnosis: Chronic pain of left knee - Plan: PT plan of care cert/re-cert  Chronic pain of right knee - Plan: PT plan of care cert/re-cert  Muscle weakness (generalized) - Plan: PT plan of care cert/re-cert  Difficulty in walking, not elsewhere classified - Plan: PT plan of care cert/re-cert  Stiffness of left knee, not elsewhere classified - Plan: PT plan of care cert/re-cert     Problem List Patient Active Problem List   Diagnosis Date Noted  . Positive ANA (antinuclear antibody) 05/01/2020  . Osteoarthritis 05/01/2020  . Bilateral primary osteoarthritis of knee 05/01/2020  . Aortic atherosclerosis ([place]) 04/18/2020  . COVID-19 virus infection 07/31/2019  . Bilateral knee pain 06/10/2019  . Leg swelling 02/13/2019  . 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 ([place]) 78/93/8101  . Physical deconditioning 02/13/2019  . Vitamin D deficiency 12/04/2018  . B12 deficiency 12/04/2018  . Hyperglycemia 11/16/2018  . Abdominal pain 06/02/2018  . Acute on chronic kidney failure ([place]) 12/12/2017  . Diastolic dysfunction 75/03/2584  . Endometrial ca ([place]) 11/30/2017  . GERD (gastroesophageal reflux disease) 11/30/2017  . HLD (hyperlipidemia) 11/30/2017  . CKD (chronic kidney disease) stage  3, GFR 30-59 ml/min (HCC) 11/30/2017  . Dyspnea on exertion 11/07/2017  . Cough 11/07/2017  . ILD (interstitial lung disease) ([place]) 03/15/2017  . Chronic respiratory failure with hypoxia ([place]) 03/15/2017  . Oral herpes simplex infection   . Influenza with pneumonia 06/28/2016  . Community acquired pneumonia 06/27/2016  . Hypertension 06/27/2016  . Depression 06/27/2016  . Sleep apnea 06/27/2016     Haydee Monica, PT, DPT 05/20/20 7:50 PM  [facility] [address] [place], [place], [address] Phone: [phone]   Fax:  [phone]  Name: Kathy [name] MRN: [id number] Date of Birth: 01-Jan-1941

## 2020-05-28 ENCOUNTER — Ambulatory Visit: Payer: Medicare Other | Attending: Internal Medicine

## 2020-05-28 ENCOUNTER — Other Ambulatory Visit: Payer: Self-pay

## 2020-05-28 DIAGNOSIS — M6281 Muscle weakness (generalized): Secondary | ICD-10-CM | POA: Insufficient documentation

## 2020-05-28 DIAGNOSIS — G8929 Other chronic pain: Secondary | ICD-10-CM | POA: Insufficient documentation

## 2020-05-28 DIAGNOSIS — M25662 Stiffness of left knee, not elsewhere classified: Secondary | ICD-10-CM | POA: Insufficient documentation

## 2020-05-28 DIAGNOSIS — R262 Difficulty in walking, not elsewhere classified: Secondary | ICD-10-CM | POA: Insufficient documentation

## 2020-05-28 DIAGNOSIS — M25562 Pain in left knee: Secondary | ICD-10-CM | POA: Diagnosis not present

## 2020-05-28 DIAGNOSIS — M25561 Pain in right knee: Secondary | ICD-10-CM | POA: Diagnosis not present

## 2020-05-28 NOTE — Therapy (Signed)
[place], [place], [address] Phone: [phone]   Fax:  [phone]  Physical Therapy Treatment  Patient Details  Name: Kathy [name] MRN: [id number] Date of Birth: 03-10-1941 Referring Provider (Kathy [name]):  Collier Salina, MD   Encounter Date: 05/28/2020   Kathy [name] End of Session - 05/28/20 1049    Visit Number 2    Number of Visits 9    Date for Kathy [name] Re-Evaluation 07/19/20    Authorization Type Medicare - FOTO at visit 6 and visit 10; PN visit 10; KX modifier visit 15    Kathy [name] Start Time 1045    Kathy [name] Stop Time 1130    Kathy [name] Time Calculation (min) 45 min    Activity Tolerance Patient tolerated treatment well    Behavior During Therapy [facility] for tasks assessed/performed           Past Medical History:  Diagnosis Date  . Arthritis    fingers  . CKD (chronic kidney disease) stage 3, GFR 30-59 ml/min (HCC) 11/30/2017  . Depression   . Dizziness    in AM, getting out of bed  . Dysrhythmia    "skips a beat" sometimes - followed by PCP  . Endometrial ca ([place]) 11/30/2017   S/p surgury 1990's  . GERD (gastroesophageal reflux disease) 11/30/2017  . HLD (hyperlipidemia) 11/30/2017  . Hypercholesteremia   . Hypertension   . Interstitial lung disease ([place])   . Neuropathy    bilateral feet  . Shortness of breath dyspnea   . Sleep apnea    has CPAP, doesn't use  . Umbilical hernia     Past Surgical History:  Procedure Laterality Date  . ABDOMINAL HYSTERECTOMY    . BROW LIFT Bilateral 07/15/2015   Procedure: BLEPHAROPLASTY;  Surgeon: Karle Starch, MD;  Location: [place];  Service: Ophthalmology;  Laterality: Bilateral;  . CHOLECYSTECTOMY    . HAMMER TOE SURGERY    . HERNIA REPAIR    . KNEE ARTHROSCOPY Bilateral   . PTOSIS REPAIR Bilateral 07/15/2015   Procedure: PTOSIS REPAIR;  Surgeon: Karle Starch, MD;  Location: [place];  Service: Ophthalmology;  Laterality: Bilateral;  CPAP  . TONSILLECTOMY       There were no vitals filed for this visit.   Subjective Assessment - 05/28/20 1053    Subjective Patient reports falling out of rollator last week because she was resting on it and her husband was holding on but had to let go after spilling his tea. Meanwhile she was reaching forward to get a straw and fell forwards when her husband briefly let go of the handles. Kathy [name] presents with bruises on anterior aspect of B knees from her fall. She states she has not performed her exercises yet because [place] but will do them now that she is back. She shows Kathy [name] that her portable oxygen tank's screen is damaged because it got jammed under a recliner but reports that she is going to try to get it replaced today.    Pertinent History see extensive PMH; hx of bilateral knee arthroscopy    Limitations Sitting;Walking;Standing;Lifting;House hold activities    How long can you sit comfortably? Always keep feet elevated when sitting; couldn't say how long she can sit comfortably    How long can you stand comfortably? Hard to say but not very long    How long can you walk comfortably? "From here to the front door"  Diagnostic tests Imagining 06/22/2016: 1. Severe tricompartment osteoarthritis.2. No acute findings.    Patient Stated Goals Be able to stand long enough to make banana pudding; be able to go up/down steps without having to only go up with RLE first    Currently in Pain? No/denies              [facility] Kathy [name] Assessment - 05/28/20 0001      Assessment   Medical Diagnosis Bilateral primary osteoarthritis of knee M17.0    Referring Provider (Kathy [name])  Collier Salina, MD                         [facility] Adult Kathy [name] Treatment/Exercise - 05/28/20 0001      Transfers   Five time sit to stand comments  29.63 seconds with use of BUE for sit>stand. SpO2 97% and HR 133 bpm      Ambulation/Gait   Ambulation/Gait Yes    Ambulation/Gait Assistance 4: Min guard     Ambulation/Gait Assistance Details SPC in RUE    Ambulation Distance (Feet) 20 Feet   2 x 20 feet   Assistive device Straight cane    Gait Comments Baseline SpO2 93% and HR 153 bpm. After initial 20 feet: SpO2 97% and HR 107 bpm. After 2nd 20 feet: SpO2 96% and HR 136 bpm. Kathy [name] required ~2 min seated rest break after 20 feet of gait training with SPC due to fatigue. Kathy [name] was able to ambulate > 100 feet to therapy gym initially upon arrival before requiring a seated break but had decreased endurance during gait training due to already walking into clinic prior.      Knee/Hip Exercises: Stretches   Passive Hamstring Stretch Both;2 reps;30 seconds    Passive Hamstring Stretch Limitations Passive stretch in supine and L HS seated stretch at EOM with trunk lean as well      Knee/Hip Exercises: Seated   Long Arc Quad Strengthening;Both;10 reps    Long Arc Quad Limitations 3 sec hold at end range knee extension    Marching AROM;Strengthening;Both;15 reps      Knee/Hip Exercises: Supine   [employer] Strengthening;Both;10 reps    Short Arc [employer] AROM;Strengthening;Both;10 reps    Straight Leg Raises AAROM;Both;Strengthening;10 reps    Straight Leg Raises Limitations Min assist from Kathy [name] during SLR    Other Supine Knee/Hip Exercises Supine clamshells x 10 each BLE                  Kathy [name] Education - 05/28/20 1258    Education Details Reviewed and updated HEP, discussed importance of obtaining full knee extension to promote a more ideal gait pattern, encouraged Kathy [name] to get portable oxygen tank replaced so she can view L/min and other information on screen. Advised Kathy [name] to bring rollator to future Kathy [name] sessions for safety and to reduce fall risk. Discussed safety when using rollator, especially when using the seat to rest - Kathy [name] states she does not use the seat often but just happened to that day at a restaurant when she fell.    Person(s) Educated Patient    Methods Explanation;Demonstration;Tactile  cues;Verbal cues    Comprehension Verbalized understanding;Returned demonstration;Verbal cues required;Tactile cues required            Kathy [name] Short Term Goals - 05/28/20 1307      Kathy [name] SHORT TERM GOAL #1   Title Patient will be independent with initial HEP.    Baseline Patient provided  with initial HEP at evaluation 05/20/2020.    Time 4    Period Weeks    Status New    Target Date 06/17/20      Kathy [name] SHORT TERM GOAL #2   Title Patient will be able to ambulate safely with CGA using SPC indoors on level ground for 25 feet.    Baseline Kathy [name] currently requires R handheld assist to maintain balance during ambulation. Uses rollator for community ambulation but did not bring it to Kathy [name] eval.    Time 4    Period Weeks    Status New    Target Date 06/17/20      Kathy [name] SHORT TERM GOAL #3   Title Kathy [name]'s FOTO score will improve from 62% limitation to 58% limitation or better to demonstrate increased tolerance with functional activities.    Baseline 62% limited; predicted 49% limitation    Time 4    Period Weeks    Status New    Target Date 06/17/20      Kathy [name] SHORT TERM GOAL #4   Title Kathy [name] will be able to stand for at least 15 minutes without requiring seated rest break to allow for meal preparation.    Baseline Kathy [name] reports she is unable to stand long enough to mix ingredients for banana pudding.    Time 4    Period Weeks    Status New    Target Date 06/17/20      Kathy [name] SHORT TERM GOAL #5   Title Patient will be able to perform 5xSTS without BUE assist in < 27 seconds from 21.5 inch mat height or lower.    Baseline 29.63 seconds with BUE assist for sit>stand (21.5 inch mat height)    Time 3    Period Weeks    Status New    Target Date 06/18/20             Kathy [name] Long Term Goals - 05/20/20 1437      Kathy [name] LONG TERM GOAL #1   Title Kathy [name] will be independent with advanced HEP.    Baseline Patient provided with initial HEP at evaluation 05/20/2020.    Time 8    Period Weeks    Status New    Target Date  07/15/20      Kathy [name] LONG TERM GOAL #2   Title Patient will be able to ambulate safely with CGA using SPC or LRAD indoors on level ground for 150 feet.    Baseline Kathy [name] currently requires R handheld assist to maintain balance during ambulation. Uses rollator for community ambulation but did not bring it to Kathy [name] eval.    Time 8    Period Weeks    Status New    Target Date 07/15/20      Kathy [name] LONG TERM GOAL #3   Title Kathy [name]'s FOTO score will improve from 62% limitation to 50% limitation or better to demonstrate increased tolerance with functional activities.    Baseline 62% limited; predicted 49% limitation    Time 8    Period Weeks    Status New    Target Date 07/15/20      Kathy [name] LONG TERM GOAL #4   Title Kathy [name] will be able to stand for at least 40 minutes without requiring seated rest break to allow for meal preparation.    Baseline Kathy [name] reports she is unable to stand long enough to mix ingredients for banana pudding.    Time 8    Period Weeks    Status New  Target Date 07/15/20      Kathy [name] LONG TERM GOAL #5   Title Kathy [name]'s TUG time will improve from 27.27 seconds with no AD to < 25 seconds with LRAD.    Baseline 27.27 seconds with Kathy [name] managing portable O2    Time 8    Period Weeks    Status New    Target Date 07/15/20                 Plan - 05/28/20 1051    Clinical Impression Statement Patient demonstrated fair tolerance to treatment session with fatigue during gait training and interventions. Quad sets and SAQ performed today to promote knee extension due to patient having difficulty performing active gravity resisted knee extension and requiring assist during SLR today. Kathy [name] palpated for quad contraction during quad sets and had Kathy [name] feel for quad activation as a cue of what to feel for when performing at home.    Personal Factors and Comorbidities Age;Comorbidity 1;Comorbidity 2;Comorbidity 3+;Time since onset of injury/illness/exacerbation    Comorbidities see extensive PMH above     Examination-Activity Limitations Locomotion Level;Sit;Squat;Stairs;Stand;Transfers;Toileting;Lift;Bend;Bed Mobility;Bathing    Examination-Participation Restrictions Cleaning;Community Activity;Shop;Meal Prep    Stability/Clinical Decision Making Stable/Uncomplicated    Rehab Potential Fair    Kathy [name] Frequency 1x / week    Kathy [name] Duration 8 weeks    Kathy [name] Treatment/Interventions ADLs/Self Care Home Management;Aquatic Therapy;Electrical Stimulation;Cryotherapy;Iontophoresis 4mg /ml Dexamethasone;Moist Heat;DME Instruction;Neuromuscular re-education;Balance training;Therapeutic exercise;Therapeutic activities;Functional mobility training;Stair training;Gait training;Patient/family education;Manual techniques;Dry needling;Energy conservation;Passive range of motion;Taping    Kathy [name] Next Visit Plan Review and update HEP PRN, continue gait training with SPC and gait training with rollator to increase endurance if able, BLE strengthening, consider aquatic therapy    Kathy [name] Home Exercise Plan [id number] - LAQ, hip ABD, SAQ, quad sets    Consulted and Agree with Plan of Care Patient           Patient will benefit from skilled therapeutic intervention in order to improve the following deficits and impairments:  Difficulty walking, Decreased range of motion, Decreased activity tolerance, Decreased balance, Decreased strength, Decreased mobility, Pain, Obesity, Improper body mechanics, Decreased endurance  Visit Diagnosis: Chronic pain of left knee  Chronic pain of right knee  Muscle weakness (generalized)  Difficulty in walking, not elsewhere classified  Stiffness of left knee, not elsewhere classified     Problem List Patient Active Problem List   Diagnosis Date Noted  . Positive ANA (antinuclear antibody) 05/01/2020  . Osteoarthritis 05/01/2020  . Bilateral primary osteoarthritis of knee 05/01/2020  . Aortic atherosclerosis ([place]) 04/18/2020  . COVID-19 virus infection 07/31/2019  . Bilateral knee pain  06/10/2019  . Leg swelling 02/13/2019  . 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 ([place]) 65/53/7482  . Physical deconditioning 02/13/2019  . Vitamin D deficiency 12/04/2018  . B12 deficiency 12/04/2018  . Hyperglycemia 11/16/2018  . Abdominal pain 06/02/2018  . Acute on chronic kidney failure ([place]) 12/12/2017  . Diastolic dysfunction 70/78/6754  . Endometrial ca ([place]) 11/30/2017  . GERD (gastroesophageal reflux disease) 11/30/2017  . HLD (hyperlipidemia) 11/30/2017  . CKD (chronic kidney disease) stage 3, GFR 30-59 ml/min (HCC) 11/30/2017  . Dyspnea on exertion 11/07/2017  . Cough 11/07/2017  . ILD (interstitial lung disease) ([place]) 03/15/2017  . Chronic respiratory failure with hypoxia ([place]) 03/15/2017  . Oral herpes simplex infection   . Influenza with pneumonia 06/28/2016  . Community acquired pneumonia 06/27/2016  . Hypertension 06/27/2016  . Depression 06/27/2016  . Sleep apnea 06/27/2016      Kathy [name], Kathy [name], Kathy [name]  05/28/20 1:11 PM   [facility] [address] [place], [place], [address] Phone: [phone]   Fax:  [phone]  Name: Kathy [name] MRN: [id number] Date of Birth: 10-01-1940

## 2020-06-01 ENCOUNTER — Other Ambulatory Visit: Payer: Self-pay | Admitting: Internal Medicine

## 2020-06-01 NOTE — Telephone Encounter (Signed)
Please refill as per office routine med refill policy (all routine meds refilled for 3 mo or monthly per pt preference up to one year from last visit, then month to month grace period for 3 mo, then further med refills will have to be denied)  

## 2020-06-04 ENCOUNTER — Ambulatory Visit: Payer: Medicare Other

## 2020-06-04 ENCOUNTER — Other Ambulatory Visit: Payer: Self-pay

## 2020-06-04 DIAGNOSIS — M25562 Pain in left knee: Secondary | ICD-10-CM | POA: Diagnosis not present

## 2020-06-04 DIAGNOSIS — M25662 Stiffness of left knee, not elsewhere classified: Secondary | ICD-10-CM

## 2020-06-04 DIAGNOSIS — R262 Difficulty in walking, not elsewhere classified: Secondary | ICD-10-CM

## 2020-06-04 DIAGNOSIS — M6281 Muscle weakness (generalized): Secondary | ICD-10-CM | POA: Diagnosis not present

## 2020-06-04 DIAGNOSIS — G8929 Other chronic pain: Secondary | ICD-10-CM

## 2020-06-04 DIAGNOSIS — M25561 Pain in right knee: Secondary | ICD-10-CM | POA: Diagnosis not present

## 2020-06-04 NOTE — Therapy (Signed)
[place], [place], [address] Phone: [phone]   Fax:  [phone]  Physical Therapy Treatment  Patient Details  Name: Kathy [name] MRN: [id number] Date of Birth: [birth year] Referring Provider (PT):  Collier Salina, MD   Encounter Date: 06/04/2020   PT End of Session - 06/04/20 1342    Visit Number 3    Number of Visits 9    Date for PT Re-Evaluation 07/19/20    Authorization Type Medicare - FOTO at visit 6 and visit 10; PN visit 10; KX modifier visit 15    PT Start Time 1000    PT Stop Time 1044    PT Time Calculation (min) 44 min    Equipment Utilized During Treatment Gait belt   gait belt during gait training (min guard) and STS (applied but not used)   Activity Tolerance Patient tolerated treatment well    Behavior During Therapy [facility] for tasks assessed/performed           Past Medical History:  Diagnosis Date  . Arthritis    fingers  . CKD (chronic kidney disease) stage 3, GFR 30-59 ml/min (HCC) 11/30/2017  . Depression   . Dizziness    in AM, getting out of bed  . Dysrhythmia    "skips a beat" sometimes - followed by PCP  . Endometrial ca ([place]) 11/30/2017   S/p surgury 1990's  . GERD (gastroesophageal reflux disease) 11/30/2017  . HLD (hyperlipidemia) 11/30/2017  . Hypercholesteremia   . Hypertension   . Interstitial lung disease ([place])   . Neuropathy    bilateral feet  . Shortness of breath dyspnea   . Sleep apnea    has CPAP, doesn't use  . Umbilical hernia     Past Surgical History:  Procedure Laterality Date  . ABDOMINAL HYSTERECTOMY    . BROW LIFT Bilateral 07/15/2015   Procedure: BLEPHAROPLASTY;  Surgeon: Karle Starch, MD;  Location: [place];  Service: Ophthalmology;  Laterality: Bilateral;  . CHOLECYSTECTOMY    . HAMMER TOE SURGERY    . HERNIA REPAIR    . KNEE ARTHROSCOPY Bilateral   . PTOSIS REPAIR Bilateral 07/15/2015   Procedure: PTOSIS REPAIR;  Surgeon: Karle Starch, MD;  Location: [place];  Service: Ophthalmology;  Laterality: Bilateral;  CPAP  . TONSILLECTOMY      There were no vitals filed for this visit.   Subjective Assessment - 06/04/20 1333    Subjective "I'm just not feeling well today. I haven't slept well over the past few days, so I just don't feel well." Pt denies pain when asked for a numerical value but later states "well they (knees) always just hurt" mid-session. PT inquired about why patient's sleep schedule has been off lately, but pt was unable to offer an explanation. Patient was able to get replacement portable oxygen tank with a screen that works.    Pertinent History see extensive PMH; hx of bilateral knee arthroscopy    Limitations Sitting;Walking;Standing;Lifting;House hold activities    How long can you sit comfortably? Always keep feet elevated when sitting; couldn't say how long she can sit comfortably    How long can you stand comfortably? Hard to say but not very long    How long can you walk comfortably? "From here to the front door"    Diagnostic tests Imagining 06/22/2016: 1. Severe tricompartment osteoarthritis.2. No acute findings.    Patient Stated Goals Be able to stand long enough to  make banana pudding; be able to go up/down steps without having to only go up with RLE first    Currently in Pain? No/denies              [facility] PT Assessment - 06/04/20 0001      Assessment   Medical Diagnosis Bilateral primary osteoarthritis of knee M17.0    Referring Provider (PT)  Collier Salina, MD                         [facility] Adult PT Treatment/Exercise - 06/04/20 0001      Ambulation/Gait   Ambulation/Gait Yes    Ambulation/Gait Assistance 4: Min guard    Ambulation/Gait Assistance Details SPC in RUE    Ambulation Distance (Feet) 80 Feet   limited due to fatigue   Assistive device Straight cane    Gait Comments Baseline SpO2 98% and HR 122 bpm. Before gait training SpO2 98% and  HR 130 bpm. After gait training SaO2 93% and HR 133 bpm.       Self-Care   Self-Care Other Self-Care Comments    Other Self-Care Comments  Reviewed and updated HEP to include seated clams with yellow theraband. Pt reports TTP slightly distal to L popliteal region that has been present since previous doctor's appointment. She does not present with L calf tightness, redness, cramping, or warmth and has (-) Homans test during session. Pt advised to contact doctor if she continues to have pain and tenderness that does not subside. Urged patient to lock rollator prior to transfers for increased safety. Reviewed optimal length for handle of SPC.      Knee/Hip Exercises: Stretches   Passive Hamstring Stretch Left;2 reps;30 seconds    Passive Hamstring Stretch Limitations Seated in chair with LLE propped on 4 inch step and forward trunk lean      Knee/Hip Exercises: Aerobic   Nustep L2 x 6 min. SaO2 97% and HR 130 bpm at 2.5 min. SaO2 98% and HR 139 bpm at 6 min.      Knee/Hip Exercises: Seated   Long Arc Quad AROM;Strengthening;Both;20 reps    Long Arc Quad Limitations 3 sec hold at end range knee extension    [employer] 20x    Marching AROM;Strengthening;Both;15 reps    Marching Limitations 15x each LE     Abduction/Adduction  AROM;Strengthening;Both;20 reps    Abd/Adduction Limitations B hip ABD against yellow theraband    Sit to [employer] 10 reps;with UE support   gait belt applied     Manual Therapy   Manual therapy comments Palpation and inspection of L calf/posterior knee. (-) Homans test.                  PT Education - 06/04/20 1336    Education Details Reviewed and updated HEP to include seated clams with yellow theraband. Pt reports TTP slightly distal to L popliteal region that has been present since previous doctor's appointment. She does not present with L calf tightness, redness, cramping, or warmth and has (-) Homans test during session. Pt advised to contact doctor if she  continues to have pain and tenderness that does not subside. Urged patient to lock rollator prior to transfers for increased safety. Reviewed optimal length for handle of SPC.    Person(s) Educated Patient    Methods Explanation;Demonstration;Tactile cues;Verbal cues    Comprehension Verbalized understanding;Returned demonstration;Verbal cues required;Tactile cues required;Need further instruction  PT Short Term Goals - 05/28/20 1307      PT SHORT TERM GOAL #1   Title Patient will be independent with initial HEP.    Baseline Patient provided with initial HEP at evaluation 05/20/2020.    Time 4    Period Weeks    Status New    Target Date 06/17/20      PT SHORT TERM GOAL #2   Title Patient will be able to ambulate safely with CGA using SPC indoors on level ground for 25 feet.    Baseline Pt currently requires R handheld assist to maintain balance during ambulation. Uses rollator for community ambulation but did not bring it to PT eval.    Time 4    Period Weeks    Status New    Target Date 06/17/20      PT SHORT TERM GOAL #3   Title Pt's FOTO score will improve from 62% limitation to 58% limitation or better to demonstrate increased tolerance with functional activities.    Baseline 62% limited; predicted 49% limitation    Time 4    Period Weeks    Status New    Target Date 06/17/20      PT SHORT TERM GOAL #4   Title Pt will be able to stand for at least 15 minutes without requiring seated rest break to allow for meal preparation.    Baseline Pt reports she is unable to stand long enough to mix ingredients for banana pudding.    Time 4    Period Weeks    Status New    Target Date 06/17/20      PT SHORT TERM GOAL #5   Title Patient will be able to perform 5xSTS without BUE assist in < 27 seconds from 21.5 inch mat height or lower.    Baseline 29.63 seconds with BUE assist for sit>stand (21.5 inch mat height)    Time 3    Period Weeks    Status New    Target  Date 06/18/20             PT Long Term Goals - 05/20/20 1437      PT LONG TERM GOAL #1   Title Pt will be independent with advanced HEP.    Baseline Patient provided with initial HEP at evaluation 05/20/2020.    Time 8    Period Weeks    Status New    Target Date 07/15/20      PT LONG TERM GOAL #2   Title Patient will be able to ambulate safely with CGA using SPC or LRAD indoors on level ground for 150 feet.    Baseline Pt currently requires R handheld assist to maintain balance during ambulation. Uses rollator for community ambulation but did not bring it to PT eval.    Time 8    Period Weeks    Status New    Target Date 07/15/20      PT LONG TERM GOAL #3   Title Pt's FOTO score will improve from 62% limitation to 50% limitation or better to demonstrate increased tolerance with functional activities.    Baseline 62% limited; predicted 49% limitation    Time 8    Period Weeks    Status New    Target Date 07/15/20      PT LONG TERM GOAL #4   Title Pt will be able to stand for at least 40 minutes without requiring seated rest break to allow for meal preparation.  Baseline Pt reports she is unable to stand long enough to mix ingredients for banana pudding.    Time 8    Period Weeks    Status New    Target Date 07/15/20      PT LONG TERM GOAL #5   Title Pt's TUG time will improve from 27.27 seconds with no AD to < 25 seconds with LRAD.    Baseline 27.27 seconds with PT managing portable O2    Time 8    Period Weeks    Status New    Target Date 07/15/20                 Plan - 06/04/20 1343    Clinical Impression Statement Patient demonstrated good tolerance to treatment session today with no adverse effects. She was able to ambulate 80 feet with SPC and min guard today compared to 2 x 20 feet (seated rest break between trips) previous session. Pt continues to demonstrate difficulty with active knee extension, especially with LLE, due to feeling of "catching".  She had TTP in area of catching upon palpation. She demonstrates decreased control during stand>sit. Pt will benefit from continued skilled PT to increase strength, endurance, and balance for improved safety and activity tolerance.    Personal Factors and Comorbidities Age;Comorbidity 1;Comorbidity 2;Comorbidity 3+;Time since onset of injury/illness/exacerbation    Comorbidities see extensive PMH above    Examination-Activity Limitations Locomotion Level;Sit;Squat;Stairs;Stand;Transfers;Toileting;Lift;Bend;Bed Mobility;Bathing    Examination-Participation Restrictions Cleaning;Community Activity;Shop;Meal Prep    Stability/Clinical Decision Making Stable/Uncomplicated    Rehab Potential Fair    PT Frequency 1x / week    PT Duration 8 weeks    PT Treatment/Interventions ADLs/Self Care Home Management;Aquatic Therapy;Electrical Stimulation;Cryotherapy;Iontophoresis 4mg /ml Dexamethasone;Moist Heat;DME Instruction;Neuromuscular re-education;Balance training;Therapeutic exercise;Therapeutic activities;Functional mobility training;Stair training;Gait training;Patient/family education;Manual techniques;Dry needling;Energy conservation;Passive range of motion;Taping    PT Next Visit Plan Review and update HEP PRN, continue gait training with SPC and gait training with rollator to increase endurance if able, BLE strengthening, consider aquatic therapy    PT Home Exercise Plan [id number] - LAQ, hip ABD, SAQ, quad sets, seated clamshell with yellow theraband    Consulted and Agree with Plan of Care Patient           Patient will benefit from skilled therapeutic intervention in order to improve the following deficits and impairments:  Difficulty walking, Decreased range of motion, Decreased activity tolerance, Decreased balance, Decreased strength, Decreased mobility, Pain, Obesity, Improper body mechanics, Decreased endurance  Visit Diagnosis: Chronic pain of left knee  Chronic pain of right knee  Muscle  weakness (generalized)  Difficulty in walking, not elsewhere classified  Stiffness of left knee, not elsewhere classified     Problem List Patient Active Problem List   Diagnosis Date Noted  . Positive ANA (antinuclear antibody) 05/01/2020  . Osteoarthritis 05/01/2020  . Bilateral primary osteoarthritis of knee 05/01/2020  . Aortic atherosclerosis ([place]) 04/18/2020  . COVID-19 virus infection 07/31/2019  . Bilateral knee pain 06/10/2019  . Leg swelling 02/13/2019  . 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 ([place]) 58/85/0277  . Physical deconditioning 02/13/2019  . Vitamin D deficiency 12/04/2018  . B12 deficiency 12/04/2018  . Hyperglycemia 11/16/2018  . Abdominal pain 06/02/2018  . Acute on chronic kidney failure ([place]) 12/12/2017  . Diastolic dysfunction 41/28/7867  . Endometrial ca ([place]) 11/30/2017  . GERD (gastroesophageal reflux disease) 11/30/2017  . HLD (hyperlipidemia) 11/30/2017  . CKD (chronic kidney disease) stage 3, GFR 30-59 ml/min (HCC) 11/30/2017  . Dyspnea on exertion 11/07/2017  .  Cough 11/07/2017  . ILD (interstitial lung disease) ([place]) 03/15/2017  . Chronic respiratory failure with hypoxia ([place]) 03/15/2017  . Oral herpes simplex infection   . Influenza with pneumonia 06/28/2016  . Community acquired pneumonia 06/27/2016  . Hypertension 06/27/2016  . Depression 06/27/2016  . Sleep apnea 06/27/2016    Haydee Monica, PT, DPT 06/04/20 1:49 PM  [facility] [address] [place], [place], [address] Phone: [phone]   Fax:  [phone]  Name: Silvia Hightower MRN: [id number] Date of Birth: October 03, 1940

## 2020-06-11 ENCOUNTER — Ambulatory Visit: Payer: Medicare Other

## 2020-06-13 ENCOUNTER — Encounter: Payer: Self-pay | Admitting: Internal Medicine

## 2020-06-18 ENCOUNTER — Ambulatory Visit: Payer: Medicare Other

## 2020-07-02 ENCOUNTER — Encounter: Payer: Self-pay | Admitting: Physical Therapy

## 2020-07-02 ENCOUNTER — Ambulatory Visit: Payer: Medicare Other | Attending: Internal Medicine | Admitting: Physical Therapy

## 2020-07-02 ENCOUNTER — Other Ambulatory Visit: Payer: Self-pay

## 2020-07-02 DIAGNOSIS — M25662 Stiffness of left knee, not elsewhere classified: Secondary | ICD-10-CM | POA: Diagnosis not present

## 2020-07-02 DIAGNOSIS — R262 Difficulty in walking, not elsewhere classified: Secondary | ICD-10-CM | POA: Insufficient documentation

## 2020-07-02 DIAGNOSIS — M25562 Pain in left knee: Secondary | ICD-10-CM | POA: Insufficient documentation

## 2020-07-02 DIAGNOSIS — M6281 Muscle weakness (generalized): Secondary | ICD-10-CM | POA: Insufficient documentation

## 2020-07-02 DIAGNOSIS — G8929 Other chronic pain: Secondary | ICD-10-CM | POA: Insufficient documentation

## 2020-07-02 DIAGNOSIS — M25561 Pain in right knee: Secondary | ICD-10-CM | POA: Diagnosis not present

## 2020-07-02 NOTE — Therapy (Signed)
[place], [place], [address] Phone: [phone]   Fax:  [phone]  Physical Therapy Treatment  Patient Details  Name: Kathy [name] MRN: [id number] Date of Birth: 04-13-1941 Referring Provider (PT):  Collier Salina, MD   Encounter Date: 07/02/2020   PT End of Session - 07/02/20 1052    Visit Number 4    Number of Visits 9    Date for PT Re-Evaluation 07/19/20    Authorization Type Medicare - FOTO at visit 6 and visit 10; PN visit 10; KX modifier visit 15    PT Start Time 1045    PT Stop Time 1129    PT Time Calculation (min) 44 min    Activity Tolerance Patient tolerated treatment well    Behavior During Therapy [facility] for tasks assessed/performed           Past Medical History:  Diagnosis Date  . Arthritis    fingers  . CKD (chronic kidney disease) stage 3, GFR 30-59 ml/min (HCC) 11/30/2017  . Depression   . Dizziness    in AM, getting out of bed  . Dysrhythmia    "skips a beat" sometimes - followed by PCP  . Endometrial ca ([place]) 11/30/2017   S/p surgury 1990's  . GERD (gastroesophageal reflux disease) 11/30/2017  . HLD (hyperlipidemia) 11/30/2017  . Hypercholesteremia   . Hypertension   . Interstitial lung disease ([place])   . Neuropathy    bilateral feet  . Shortness of breath dyspnea   . Sleep apnea    has CPAP, doesn't use  . Umbilical hernia     Past Surgical History:  Procedure Laterality Date  . ABDOMINAL HYSTERECTOMY    . BROW LIFT Bilateral 07/15/2015   Procedure: BLEPHAROPLASTY;  Surgeon: Karle Starch, MD;  Location: [place];  Service: Ophthalmology;  Laterality: Bilateral;  . CHOLECYSTECTOMY    . HAMMER TOE SURGERY    . HERNIA REPAIR    . KNEE ARTHROSCOPY Bilateral   . PTOSIS REPAIR Bilateral 07/15/2015   Procedure: PTOSIS REPAIR;  Surgeon: Karle Starch, MD;  Location: [place];  Service: Ophthalmology;  Laterality: Bilateral;  CPAP  . TONSILLECTOMY       There were no vitals filed for this visit.   Subjective Assessment - 07/02/20 1050    Subjective Patient report knees are feeling tired, no more discomfort than usual    Patient Stated Goals Be able to stand long enough to make banana pudding; be able to go up/down steps without having to only go up with RLE first    Currently in Pain? Yes    Pain Score 7     Pain Location Knee    Pain Orientation Right;Left    Pain Descriptors / Indicators Aching    Pain Type Chronic pain    Pain Onset More than a month ago    Pain Frequency Constant              [facility] PT Assessment - 07/02/20 0001      Assessment   Medical Diagnosis Bilateral primary osteoarthritis of knee M17.0    Referring Provider (PT)  Collier Salina, MD      Precautions   Precautions None      Restrictions   Weight Bearing Restrictions No      Prior Function   Level of Independence Independent with basic ADLs;Independent with household mobility with device;Independent with community mobility with device  Observation/Other Assessments   Focus on Therapeutic Outcomes (FOTO)  62% limited; predicted 49% limitation      Transfers   Five time sit to stand comments  28 seconds without UE assist from 21" table                         [facility] Adult PT Treatment/Exercise - 07/02/20 0001      Ambulation/Gait   Ambulation/Gait Yes    Ambulation/Gait Assistance 6: Modified independent (Device/Increase time)    Ambulation Distance (Feet) 100 Feet   2 bouts   Assistive device Rolling walker      Exercises   Exercises Knee/Hip      Knee/Hip Exercises: Stretches   Passive Hamstring Stretch 3 reps;20 seconds    Passive Hamstring Stretch Limitations seated edge of mat, manual pressure over knee pushing into extension on left      Knee/Hip Exercises: Aerobic   Nustep L2 x 6 min with UE/LE      Knee/Hip Exercises: Standing   Heel Raises 15 reps    Hip Flexion 20 reps    Hip Flexion  Limitations alternating marching    Hip Abduction 10 reps      Knee/Hip Exercises: Seated   Long Arc Quad 2 sets;15 reps    Long Arc Quad Weight 1 lbs.   2nd set only   Long [employer] Limitations 3 sec hold at end range knee extension    Clamshell with [employer] Yellow   2 x 20   Marching 2 sets;20 reps    Marching Weights 1 lbs.                  PT Education - 07/02/20 1051    Education Details HEP    Person(s) Educated Patient    Methods Explanation    Comprehension Verbalized understanding;Need further instruction            PT Short Term Goals - 07/02/20 1114      PT SHORT TERM GOAL #1   Title Patient will be independent with initial HEP.    Baseline Patient reports exercises are going slow at home    Time 4    Period Weeks    Status On-going    Target Date 06/17/20      PT SHORT TERM GOAL #2   Title Patient will be able to ambulate safely with CGA using SPC indoors on level ground for 25 feet.    Baseline Pt able to ambulate household distances using SPC. Uses rollator for community ambulation.    Time 4    Period Weeks    Status Achieved    Target Date 06/17/20      PT SHORT TERM GOAL #3   Title Pt's FOTO score will improve from 62% limitation to 58% limitation or better to demonstrate increased tolerance with functional activities.    Baseline 62% limited; predicted 49% limitation    Time 4    Period Weeks    Status On-going    Target Date 06/17/20      PT SHORT TERM GOAL #4   Title Pt will be able to stand for at least 15 minutes without requiring seated rest break to allow for meal preparation.    Baseline Pt reports she can stand 5-10 minutes    Time 4    Period Weeks    Status On-going    Target Date 06/17/20      PT SHORT TERM  GOAL #5   Title Patient will be able to perform 5xSTS without BUE assist in < 27 seconds from 21.5 inch mat height or lower.    Baseline 28 seconds without UE assist from 21" table    Time 3    Period Weeks     Status On-going    Target Date 06/17/20             PT Long Term Goals - 05/20/20 1437      PT LONG TERM GOAL #1   Title Pt will be independent with advanced HEP.    Baseline Patient provided with initial HEP at evaluation 05/20/2020.    Time 8    Period Weeks    Status New    Target Date 07/15/20      PT LONG TERM GOAL #2   Title Patient will be able to ambulate safely with CGA using SPC or LRAD indoors on level ground for 150 feet.    Baseline Pt currently requires R handheld assist to maintain balance during ambulation. Uses rollator for community ambulation but did not bring it to PT eval.    Time 8    Period Weeks    Status New    Target Date 07/15/20      PT LONG TERM GOAL #3   Title Pt's FOTO score will improve from 62% limitation to 50% limitation or better to demonstrate increased tolerance with functional activities.    Baseline 62% limited; predicted 49% limitation    Time 8    Period Weeks    Status New    Target Date 07/15/20      PT LONG TERM GOAL #4   Title Pt will be able to stand for at least 40 minutes without requiring seated rest break to allow for meal preparation.    Baseline Pt reports she is unable to stand long enough to mix ingredients for banana pudding.    Time 8    Period Weeks    Status New    Target Date 07/15/20      PT LONG TERM GOAL #5   Title Pt's TUG time will improve from 27.27 seconds with no AD to < 25 seconds with LRAD.    Baseline 27.27 seconds with PT managing portable O2    Time 8    Period Weeks    Status New    Target Date 07/15/20                 Plan - 07/02/20 1053    Clinical Impression Statement Patient tolerated therapy well with no adverse effects. She arrived using rollator and demonstrates no unsteadiness or need for guarding when using this. She continues to exhibit limitation with left knee motion and reports some functional status on FOTO. She did progress with 5xSTS and reports independence with  walking around home using SPC. Continued progression of strengthening this visit and incorporated standing exercises. Patient would benefit from continued skilled PT to increase strength, endurance, and balance for improved safety and activity tolerance.    PT Treatment/Interventions ADLs/Self Care Home Management;Aquatic Therapy;Electrical Stimulation;Cryotherapy;Iontophoresis 4mg /ml Dexamethasone;Moist Heat;DME Instruction;Neuromuscular re-education;Balance training;Therapeutic exercise;Therapeutic activities;Functional mobility training;Stair training;Gait training;Patient/family education;Manual techniques;Dry needling;Energy conservation;Passive range of motion;Taping    PT Next Visit Plan Review and update HEP PRN, continue gait training with SPC and gait training with rollator to increase endurance if able, BLE strengthening, consider aquatic therapy    PT Home Exercise Plan [id number] - LAQ, hip ABD, SAQ, quad sets, seated  clamshell with yellow theraband    Consulted and Agree with Plan of Care Patient           Patient will benefit from skilled therapeutic intervention in order to improve the following deficits and impairments:  Difficulty walking,Decreased range of motion,Decreased activity tolerance,Decreased balance,Decreased strength,Decreased mobility,Pain,Obesity,Improper body mechanics,Decreased endurance  Visit Diagnosis: Chronic pain of left knee  Chronic pain of right knee  Muscle weakness (generalized)  Difficulty in walking, not elsewhere classified  Stiffness of left knee, not elsewhere classified     Problem List Patient Active Problem List   Diagnosis Date Noted  . Positive ANA (antinuclear antibody) 05/01/2020  . Osteoarthritis 05/01/2020  . Bilateral primary osteoarthritis of knee 05/01/2020  . Aortic atherosclerosis (HCC) 04/18/2020  . COVID-19 virus infection 07/31/2019  . Bilateral knee pain 06/10/2019  . Leg swelling 02/13/2019  . Bronchiectasis without  complication (HCC) 02/13/2019  . Physical deconditioning 02/13/2019  . Vitamin D deficiency 12/04/2018  . B12 deficiency 12/04/2018  . Hyperglycemia 11/16/2018  . Abdominal pain 06/02/2018  . Acute on chronic kidney failure (HCC) 12/12/2017  . Diastolic dysfunction 12/12/2017  . Endometrial ca (HCC) 11/30/2017  . GERD (gastroesophageal reflux disease) 11/30/2017  . HLD (hyperlipidemia) 11/30/2017  . CKD (chronic kidney disease) stage 3, GFR 30-59 ml/min (HCC) 11/30/2017  . Dyspnea on exertion 11/07/2017  . Cough 11/07/2017  . ILD (interstitial lung disease) (HCC) 03/15/2017  . Chronic respiratory failure with hypoxia (HCC) 03/15/2017  . Oral herpes simplex infection   . Influenza with pneumonia 06/28/2016  . Community acquired pneumonia 06/27/2016  . Hypertension 06/27/2016  . Depression 06/27/2016  . Sleep apnea 06/27/2016    Rosana Hoes, PT, DPT, LAT, ATC 07/02/20  1:31 PM Phone: [phone] Fax: [phone]   [facility] [address] [place], [place], [address] Phone: [phone]   Fax:  [phone]  Name: Natalye Kott MRN: [id number] Date of Birth: [birth year]

## 2020-07-09 ENCOUNTER — Ambulatory Visit: Payer: Medicare Other | Admitting: Physical Therapy

## 2020-07-09 ENCOUNTER — Other Ambulatory Visit: Payer: Self-pay | Admitting: Internal Medicine

## 2020-07-09 ENCOUNTER — Other Ambulatory Visit: Payer: Self-pay

## 2020-07-09 ENCOUNTER — Encounter: Payer: Self-pay | Admitting: Physical Therapy

## 2020-07-09 DIAGNOSIS — M25561 Pain in right knee: Secondary | ICD-10-CM | POA: Diagnosis not present

## 2020-07-09 DIAGNOSIS — M25562 Pain in left knee: Secondary | ICD-10-CM

## 2020-07-09 DIAGNOSIS — R262 Difficulty in walking, not elsewhere classified: Secondary | ICD-10-CM

## 2020-07-09 DIAGNOSIS — M6281 Muscle weakness (generalized): Secondary | ICD-10-CM | POA: Diagnosis not present

## 2020-07-09 DIAGNOSIS — G8929 Other chronic pain: Secondary | ICD-10-CM | POA: Diagnosis not present

## 2020-07-09 DIAGNOSIS — M25662 Stiffness of left knee, not elsewhere classified: Secondary | ICD-10-CM | POA: Diagnosis not present

## 2020-07-09 NOTE — Patient Instructions (Signed)
Access Code: [email or web address] URL: [email or web address] Date: 07/09/2020 Prepared by: Hilda Blades  Exercises Seated Hamstring Stretch - 1-2 x daily - 7 x weekly - 60 seconds hold Seated Calf Stretch with Strap - 1-2 x daily - 7 x weekly - 60 seconds hold Seated Long Arc Quad - 1-2 x daily - 7 x weekly - 15 reps Sit to Stand with Armchair - 1-2 x daily - 7 x weekly - 10 reps Standing Hip Abduction with Counter Support - 1-2 x daily - 7 x weekly - 15 reps Heel rises with counter support - 1-2 x daily - 7 x weekly - 15 reps Standing March with Counter Support - 1-2 x daily - 7 x weekly - 20 reps

## 2020-07-09 NOTE — Therapy (Signed)
[place], [place], [address] Phone: [phone]   Fax:  [phone]  Physical Therapy Treatment  Patient Details  Name: Kathy [name] MRN: [id number] Date of Birth: 1941/06/28 Referring Provider (PT):  Collier Salina, MD   Encounter Date: 07/09/2020   PT End of Session - 07/09/20 1040    Visit Number 5    Number of Visits 9    Date for PT Re-Evaluation 07/19/20    Authorization Type Medicare - FOTO at visit 6 and visit 10; KX modifier visit 15    Progress Note Due on Visit 10    PT Start Time 1045    PT Stop Time 1128    PT Time Calculation (min) 43 min    Activity Tolerance Patient tolerated treatment well    Behavior During Therapy [facility] for tasks assessed/performed           Past Medical History:  Diagnosis Date  . Arthritis    fingers  . CKD (chronic kidney disease) stage 3, GFR 30-59 ml/min (HCC) 11/30/2017  . Depression   . Dizziness    in AM, getting out of bed  . Dysrhythmia    "skips a beat" sometimes - followed by PCP  . Endometrial ca ([place]) 11/30/2017   S/p surgury 1990's  . GERD (gastroesophageal reflux disease) 11/30/2017  . HLD (hyperlipidemia) 11/30/2017  . Hypercholesteremia   . Hypertension   . Interstitial lung disease ([place])   . Neuropathy    bilateral feet  . Shortness of breath dyspnea   . Sleep apnea    has CPAP, doesn't use  . Umbilical hernia     Past Surgical History:  Procedure Laterality Date  . ABDOMINAL HYSTERECTOMY    . BROW LIFT Bilateral 07/15/2015   Procedure: BLEPHAROPLASTY;  Surgeon: Karle Starch, MD;  Location: [place];  Service: Ophthalmology;  Laterality: Bilateral;  . CHOLECYSTECTOMY    . HAMMER TOE SURGERY    . HERNIA REPAIR    . KNEE ARTHROSCOPY Bilateral   . PTOSIS REPAIR Bilateral 07/15/2015   Procedure: PTOSIS REPAIR;  Surgeon: Karle Starch, MD;  Location: [place];  Service: Ophthalmology;  Laterality: Bilateral;  CPAP  .  TONSILLECTOMY      There were no vitals filed for this visit.   Subjective Assessment - 07/09/20 1040    Subjective Patient reports she is doing about the same. States her left knee popped a few days ago and it hurt, now it feels about the same as usual.    Patient Stated Goals Be able to stand long enough to make banana pudding; be able to go up/down steps without having to only go up with RLE first    Currently in Pain? Yes    Pain Score 5     Pain Location Knee    Pain Orientation Right;Left    Pain Descriptors / Indicators Aching    Pain Type Chronic pain    Pain Onset More than a month ago    Pain Frequency Constant              [facility] PT Assessment - 07/09/20 0001      6 Minute walk- Post Test   6 Minute Walk Post Test yes    HR (bpm) 162    02 Sat (%RA) 95 %      6 minute walk test results    Aerobic Endurance Distance Walked 550    Endurance additional comments  patient used rollator                         [facility] Adult PT Treatment/Exercise - 07/09/20 0001      Exercises   Exercises Knee/Hip      Knee/Hip Exercises: Stretches   Passive Hamstring Stretch 60 seconds    Passive Hamstring Stretch Limitations seated edge of chair    Gastroc Stretch 60 seconds    Gastroc Stretch Limitations seated edge if chair with towel      Knee/Hip Exercises: Aerobic   Nustep L3 x 6 min with UE/LE      Knee/Hip Exercises: Standing   Heel Raises 15 reps    Hip Flexion 20 reps    Hip Flexion Limitations alternating marching    Hip Abduction 15 reps      Knee/Hip Exercises: Seated   Long Arc Quad 15 reps    Long Arc Quad Limitations 3 sec hold at end range knee extension    Sit to [employer] 2 sets;5 reps   standard height chair, UE support for stand and no-UE support for lowering                 PT Education - 07/09/20 1053    Education Details HEP update    Person(s) Educated Patient    Methods Explanation;Demonstration;Verbal cues;Handout     Comprehension Verbalized understanding;Need further instruction;Returned demonstration;Verbal cues required            PT Short Term Goals - 07/02/20 1114      PT SHORT TERM GOAL #1   Title Patient will be independent with initial HEP.    Baseline Patient reports exercises are going slow at home    Time 4    Period Weeks    Status On-going    Target Date 06/17/20      PT SHORT TERM GOAL #2   Title Patient will be able to ambulate safely with CGA using SPC indoors on level ground for 25 feet.    Baseline Pt able to ambulate household distances using SPC. Uses rollator for community ambulation.    Time 4    Period Weeks    Status Achieved    Target Date 06/17/20      PT SHORT TERM GOAL #3   Title Pt's FOTO score will improve from 62% limitation to 58% limitation or better to demonstrate increased tolerance with functional activities.    Baseline 62% limited; predicted 49% limitation    Time 4    Period Weeks    Status On-going    Target Date 06/17/20      PT SHORT TERM GOAL #4   Title Pt will be able to stand for at least 15 minutes without requiring seated rest break to allow for meal preparation.    Baseline Pt reports she can stand 5-10 minutes    Time 4    Period Weeks    Status On-going    Target Date 06/17/20      PT SHORT TERM GOAL #5   Title Patient will be able to perform 5xSTS without BUE assist in < 27 seconds from 21.5 inch mat height or lower.    Baseline 28 seconds without UE assist from 21" table    Time 3    Period Weeks    Status On-going    Target Date 06/17/20             PT Long Term Goals -  05/20/20 1437      PT LONG TERM GOAL #1   Title Pt will be independent with advanced HEP.    Baseline Patient provided with initial HEP at evaluation 05/20/2020.    Time 8    Period Weeks    Status New    Target Date 07/15/20      PT LONG TERM GOAL #2   Title Patient will be able to ambulate safely with CGA using SPC or LRAD indoors on level ground  for 150 feet.    Baseline Pt currently requires R handheld assist to maintain balance during ambulation. Uses rollator for community ambulation but did not bring it to PT eval.    Time 8    Period Weeks    Status New    Target Date 07/15/20      PT LONG TERM GOAL #3   Title Pt's FOTO score will improve from 62% limitation to 50% limitation or better to demonstrate increased tolerance with functional activities.    Baseline 62% limited; predicted 49% limitation    Time 8    Period Weeks    Status New    Target Date 07/15/20      PT LONG TERM GOAL #4   Title Pt will be able to stand for at least 40 minutes without requiring seated rest break to allow for meal preparation.    Baseline Pt reports she is unable to stand long enough to mix ingredients for banana pudding.    Time 8    Period Weeks    Status New    Target Date 07/15/20      PT LONG TERM GOAL #5   Title Pt's TUG time will improve from 27.27 seconds with no AD to < 25 seconds with LRAD.    Baseline 27.27 seconds with PT managing portable O2    Time 8    Period Weeks    Status New    Target Date 07/15/20                 Plan - 07/09/20 1041    Clinical Impression Statement Patient tolerated therapy well with no adverse effects. Patient able to complete 6MWT without rest break and SaO2 remained >95% throughout while on 2L of supplemental O2. She did require an extended rest break following the test due to fatigue and SOB. Continued focus on gerneral LE strengthening this visit. HEP updated so patient could perform more conveniently. Patient would benefit from continued skilled PT to increase strength, endurance, and balance for improved safety and activity tolerance.    PT Treatment/Interventions ADLs/Self Care Home Management;Aquatic Therapy;Electrical Stimulation;Cryotherapy;Iontophoresis 4mg /ml Dexamethasone;Moist Heat;DME Instruction;Neuromuscular re-education;Balance training;Therapeutic exercise;Therapeutic  activities;Functional mobility training;Stair training;Gait training;Patient/family education;Manual techniques;Dry needling;Energy conservation;Passive range of motion;Taping    PT Next Visit Plan Review and update HEP PRN, continue gait training with SPC and gait training with rollator to increase endurance if able, BLE strengthening, consider aquatic therapy    PT Home Exercise Plan [email or web address]    Consulted and Agree with Plan of Care Patient           Patient will benefit from skilled therapeutic intervention in order to improve the following deficits and impairments:  Difficulty walking,Decreased range of motion,Decreased activity tolerance,Decreased balance,Decreased strength,Decreased mobility,Pain,Obesity,Improper body mechanics,Decreased endurance  Visit Diagnosis: Chronic pain of left knee  Chronic pain of right knee  Muscle weakness (generalized)  Difficulty in walking, not elsewhere classified  Stiffness of left knee, not elsewhere classified     Problem  List Patient Active Problem List   Diagnosis Date Noted  . Positive ANA (antinuclear antibody) 05/01/2020  . Osteoarthritis 05/01/2020  . Bilateral primary osteoarthritis of knee 05/01/2020  . Aortic atherosclerosis ([place]) 04/18/2020  . COVID-19 virus infection 07/31/2019  . Bilateral knee pain 06/10/2019  . Leg swelling 02/13/2019  . 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 ([place]) 29/92/4268  . Physical deconditioning 02/13/2019  . Vitamin D deficiency 12/04/2018  . B12 deficiency 12/04/2018  . Hyperglycemia 11/16/2018  . Abdominal pain 06/02/2018  . Acute on chronic kidney failure ([place]) 12/12/2017  . Diastolic dysfunction 34/19/6222  . Endometrial ca ([place]) 11/30/2017  . GERD (gastroesophageal reflux disease) 11/30/2017  . HLD (hyperlipidemia) 11/30/2017  . CKD (chronic kidney disease) stage 3, GFR 30-59 ml/min (HCC) 11/30/2017  . Dyspnea on exertion 11/07/2017  . Cough 11/07/2017  . ILD (interstitial lung disease)  ([place]) 03/15/2017  . Chronic respiratory failure with hypoxia ([place]) 03/15/2017  . Oral herpes simplex infection   . Influenza with pneumonia 06/28/2016  . Community acquired pneumonia 06/27/2016  . Hypertension 06/27/2016  . Depression 06/27/2016  . Sleep apnea 06/27/2016    Hilda Blades, PT, DPT, LAT, ATC 07/09/20  11:31 AM Phone: [phone] Fax: [facility] [address] [place], [place], [address] Phone: [phone]   Fax:  [phone]  Name: Kathy [name] MRN: [id number] Date of Birth: [birth year]

## 2020-07-11 ENCOUNTER — Encounter: Payer: Self-pay | Admitting: Cardiology

## 2020-07-11 ENCOUNTER — Other Ambulatory Visit: Payer: Self-pay

## 2020-07-11 ENCOUNTER — Ambulatory Visit: Payer: Medicare Other | Admitting: Cardiology

## 2020-07-11 VITALS — BP 113/91 | HR 111 | Temp 97.8°F | Resp 16 | Ht 60.0 in | Wt 218.6 lb

## 2020-07-11 DIAGNOSIS — I4819 Other persistent atrial fibrillation: Secondary | ICD-10-CM | POA: Diagnosis not present

## 2020-07-11 DIAGNOSIS — I4891 Unspecified atrial fibrillation: Secondary | ICD-10-CM | POA: Insufficient documentation

## 2020-07-11 DIAGNOSIS — I5020 Unspecified systolic (congestive) heart failure: Secondary | ICD-10-CM | POA: Insufficient documentation

## 2020-07-11 MED ORDER — DILTIAZEM HCL ER COATED BEADS 120 MG PO CP24: 120.0000 mg | ORAL_CAPSULE | Freq: Every day | ORAL | 3 refills | Status: DC

## 2020-07-11 MED ORDER — APIXABAN 2.5 MG PO TABS: 5.0000 mg | ORAL_TABLET | Freq: Two times a day (BID) | ORAL | 3 refills | Status: DC

## 2020-07-11 NOTE — Progress Notes (Signed)
Patient referred by Biagio Borg, MD for pulmonary hypertension  Subjective:   Kathy [name], female    DOB: 1941-03-17, 80 y.o.   MRN: [id number]   Chief Complaint  Patient presents with  . Pulmonary Hypertension  . New Patient (Initial Visit)    Referred by Praveen Mannam     HPI  80 y.o. Caucasian female with hypertension, hyperlipidemia, interstitial lung disease, on 2 L O2, h/o COVID 06/2019, referred for management of Afib, cardiomyopathy, pulmonary hypertension  Patient has been on 2 L oxygen for at least 1-2 years. COVID-19 in 2021 probably caused progression of her baseline ILD, according to Dr. Vaughan Browner. Currently, she is limited to walking inside the house with stable exertional dyspnea. She denies any chest pain. She was noted to be in Afib on echocardiogram in 01/2020, although I do not see any prior EKG's. Echocardiogram showed LVEF 45-50%, mild LA dilatation, estimated PASP 36 mmHg. She reports occasional lightheadedness, but denies any presyncope, syncope, chest pain. Leg edema is currently well controlled. She denies orthopnea, PND.   Past Medical History:  Diagnosis Date  . Arthritis    fingers  . CKD (chronic kidney disease) stage 3, GFR 30-59 ml/min (HCC) 11/30/2017  . Depression   . Dizziness    in AM, getting out of bed  . Dysrhythmia    "skips a beat" sometimes - followed by PCP  . Endometrial ca ([place]) 11/30/2017   S/p surgury 1990's  . GERD (gastroesophageal reflux disease) 11/30/2017  . HLD (hyperlipidemia) 11/30/2017  . Hypercholesteremia   . Hypertension   . Interstitial lung disease ([place])   . Neuropathy    bilateral feet  . Shortness of breath dyspnea   . Sleep apnea    has CPAP, doesn't use  . Umbilical hernia      Past Surgical History:  Procedure Laterality Date  . ABDOMINAL HYSTERECTOMY    . BROW LIFT Bilateral 07/15/2015   Procedure: BLEPHAROPLASTY;  Surgeon: Karle Starch, MD;  Location: [place];  Service: Ophthalmology;   Laterality: Bilateral;  . CHOLECYSTECTOMY    . HAMMER TOE SURGERY    . HERNIA REPAIR    . KNEE ARTHROSCOPY Bilateral   . PTOSIS REPAIR Bilateral 07/15/2015   Procedure: PTOSIS REPAIR;  Surgeon: Karle Starch, MD;  Location: [place];  Service: Ophthalmology;  Laterality: Bilateral;  CPAP  . TONSILLECTOMY       Social History   Tobacco Use  Smoking Status Former Smoker  . Packs/day: 1.00  . Years: 10.00  . Pack years: 10.00  . Types: Cigarettes  . Quit date: 06/28/1986  . Years since quitting: 34.0  Smokeless Tobacco Never Used  Tobacco Comment   quit 40+ yrs ago, 1 PPD for a few years    Social History   Substance and Sexual Activity  Alcohol Use No     Family History  Problem Relation Age of Onset  . Congestive Heart Failure Mother   . Stroke Father   . Parkinson's disease Father   . Diabetes Son      Current Outpatient Medications on File Prior to Visit  Medication Sig Dispense Refill  . albuterol (VENTOLIN HFA) 108 (90 Base) MCG/ACT inhaler Inhale 1-2 puffs into the lungs every 6 (six) hours as needed for wheezing or shortness of breath. 1 Inhaler 11  . Cholecalciferol (VITAMIN D-3) 25 MCG (1000 UT) CAPS Take 1 capsule by mouth daily.    . cyanocobalamin (,VITAMIN B-12,) 1000 MCG/ML injection  INJECT 1 ML IM EVERY 30 DAYS 3 mL 1  . fenofibrate micronized (LOFIBRA) 134 MG capsule 1 CAPSULE BY MOUTH ONCE DAILY BEFORE BREAKFAST 90 capsule 2  . furosemide (LASIX) 40 MG tablet 1 tab by mouth in the AM, and 1 tab by mouth in the PM as needed for persistent swelling or weight gain more than 3-5 lbs 60 tablet 11  . losartan (COZAAR) 100 MG tablet TAKE 1/2 (ONE HALF) TABLET BY MOUTH ONCE DAILY 45 tablet 2  . meloxicam (MOBIC) 15 MG tablet Take 1 tablet (15 mg total) by mouth daily. 90 tablet 3  . metoprolol succinate (TOPROL-XL) 50 MG 24 hr tablet TAKE 1 TABLET BY MOUTH ONCE DAILY, TAKE WITH OR IMMEDIATELY FOLLOWING A MEAL 90 tablet 2  . pantoprazole (PROTONIX) 40  MG tablet Take 1 tablet (40 mg total) by mouth daily. 90 tablet 3  . Respiratory Therapy Supplies (FLUTTER) DEVI Use as directed 1 each 0  . triamcinolone cream (KENALOG) 0.5 % Apply 1 application topically 2 (two) times daily. 30 g 1  . benzonatate (TESSALON) 100 MG capsule Take 1 capsule (100 mg total) by mouth every 8 (eight) hours. (Patient not taking: Reported on 07/11/2020) 21 capsule 0   No current facility-administered medications on file prior to visit.    Cardiovascular and other pertinent studies:  EKG 07/11/2020: Atrial fibrillation 120 bpm Nonspecific T-abnormality  Echocardiogram 02/18/2020: 1. Left ventricular ejection fraction, by estimation, is 45 to 50%. The  left ventricle has mildly decreased function. The left ventricle  demonstrates global hypokinesis. Left ventricular diastolic parameters are  indeterminate.  2. Right ventricular systolic function is normal. The right ventricular  size is normal. There is mildly elevated pulmonary artery systolic  pressure. The estimated right ventricular systolic pressure is 22.9 mmHg.  3. Left atrial size was mildly dilated.  4. The mitral valve is normal in structure. No evidence of mitral valve  regurgitation. No evidence of mitral stenosis.  5. The aortic valve is tricuspid. Aortic valve regurgitation is not  visualized. Mild aortic valve sclerosis is present, with no evidence of  aortic valve stenosis.  6. The inferior vena cava is normal in size with <50% respiratory  variability, suggesting right atrial pressure of 8 mmHg.  7. The patient appeared to be in atrial fibrillation with mild RVR.   CT Chest 02/08/2020: 1. 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 Findings may be minimally progressive from 01/11/2018 and the possibility of superimposed post COVID-19 inflammatory fibrosis cannot be excluded. Findings are suggestive of an alternative diagnosis (not  UIP) per consensus guidelines: Diagnosis of Idiopathic Pulmonary Fibrosis: An Official ATS/ERS/JRS/ALAT Clinical Practice Guideline. [place], Iss 5, [phone], Feb 26 2017. 2. Aortic atherosclerosis (ICD10-I70.0). Coronary artery calcification. 3. Enlarged pulmonic trunk, indicative of pulmonary arterial hypertension.    Recent labs: 12/18/2019: Glucose 94, BUN/Cr 24/1.19. EGFR 43. Na/K 138/4.0. Rest of the CMP normal H/H 11/34. MCV 96. Platelets 216 Chol 130, TG 74, HDL 47, LDL 68    Review of Systems  Cardiovascular: Positive for dyspnea on exertion. Negative for chest pain, leg swelling, palpitations and syncope.  Respiratory: Positive for shortness of breath.   Neurological: Positive for light-headedness.         Vitals:   07/11/20 1259 07/11/20 1300  BP:    Pulse:    Resp:    Temp:    SpO2: 94% 93%     Body mass index  is 42.69 kg/m. Filed Weights   07/11/20 1246  Weight: 218 lb 9.6 oz (99.2 kg)    Orthostatic VS for the past 72 hrs (Last 3 readings):  Orthostatic BP Patient Position BP Location Cuff Size Orthostatic Pulse  07/11/20 1300 118/83 Standing Left Arm Large 122  07/11/20 1259 127/77 Sitting Left Arm Large 84  07/11/20 1255 107/68 Supine Left Arm Large 79     Objective:   Physical Exam Vitals and nursing note reviewed.  Constitutional:      General: She is not in acute distress. Neck:     Vascular: No JVD.  Cardiovascular:     Rate and Rhythm: Normal rate and regular rhythm.     Heart sounds: Normal heart sounds. No murmur heard.   Pulmonary:     Effort: Pulmonary effort is normal.     Breath sounds: Normal breath sounds. No wheezing or rales.          Assessment & Recommendations:   80 y.o. Caucasian female with hypertension, hyperlipidemia, interstitial lung disease, on 2 L O2, h/o COVID 06/2019, referred for management of Afib, cardiomyopathy, pulmonary hypertension  Pulmonary hypertension: Modest  elevated of PASP 36 mmHg on echocardiogram 01/2020. 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 Recommend continued management of underlying medical comorbidities. Low suspicion for WHO grp I PH/PAH.  Persistent Afib: Mild RVR. Added diltiazem 120 mg, conitnue metoprolol succinate 50 mg daily. CHA2DS2VASc score 4, annual stroke risk 5%. Started eliquis 5 mg bid. Avoid NSAIDs.   HFrEF: Mild. Clinically euvolumic. Probably related to Afib. Conitnue losartan, metoprolol.   F/u in 4 weeks  Thank you for referring the patient to [place]. Please feel free to contact with any questions.   Nigel Mormon, MD Pager: [phone] Office: [phone]

## 2020-07-16 ENCOUNTER — Other Ambulatory Visit: Payer: Self-pay

## 2020-07-16 ENCOUNTER — Ambulatory Visit: Payer: Medicare Other | Admitting: Physical Therapy

## 2020-07-16 ENCOUNTER — Encounter: Payer: Self-pay | Admitting: Physical Therapy

## 2020-07-16 DIAGNOSIS — M25662 Stiffness of left knee, not elsewhere classified: Secondary | ICD-10-CM

## 2020-07-16 DIAGNOSIS — M25561 Pain in right knee: Secondary | ICD-10-CM | POA: Diagnosis not present

## 2020-07-16 DIAGNOSIS — G8929 Other chronic pain: Secondary | ICD-10-CM | POA: Diagnosis not present

## 2020-07-16 DIAGNOSIS — R262 Difficulty in walking, not elsewhere classified: Secondary | ICD-10-CM

## 2020-07-16 DIAGNOSIS — M6281 Muscle weakness (generalized): Secondary | ICD-10-CM

## 2020-07-16 DIAGNOSIS — M25562 Pain in left knee: Secondary | ICD-10-CM | POA: Diagnosis not present

## 2020-07-16 NOTE — Therapy (Addendum)
[place], [place], [address] Phone: [phone]   Fax:  [phone]  Physical Therapy Treatment / ERO/Discharge   Progress Note Reporting Period 05/20/2020 to 07/16/2020  See note below for Objective Data and Assessment of Progress/Goals.    Patient Details  Name: Kathy [name] MRN: [id number] Date of Birth: 1940/10/18 Referring Provider (PT):  Collier Salina, MD   Encounter Date: 07/16/2020   PT End of Session - 07/16/20 1051    Visit Number 6    Number of Visits 14    Date for PT Re-Evaluation 09/10/20    Authorization Type Medicare - FOTO at visit 10; KX modifier visit 15    Progress Note Due on Visit 16    PT Start Time 1045    PT Stop Time 1127    PT Time Calculation (min) 42 min    Activity Tolerance Patient tolerated treatment well    Behavior During Therapy WFL for tasks assessed/performed           Past Medical History:  Diagnosis Date  . Arthritis    fingers  . CKD (chronic kidney disease) stage 3, GFR 30-59 ml/min (HCC) 11/30/2017  . Depression   . Dizziness    in AM, getting out of bed  . Dysrhythmia    "skips a beat" sometimes - followed by PCP  . Endometrial ca ([facility]) 11/30/2017   S/p surgury 1990's  . GERD (gastroesophageal reflux disease) 11/30/2017  . HLD (hyperlipidemia) 11/30/2017  . Hypercholesteremia   . Hypertension   . Interstitial lung disease ([facility])   . Neuropathy    bilateral feet  . Shortness of breath dyspnea   . Sleep apnea    has CPAP, doesn't use  . Umbilical hernia     Past Surgical History:  Procedure Laterality Date  . ABDOMINAL HYSTERECTOMY    . BROW LIFT Bilateral 07/15/2015   Procedure: BLEPHAROPLASTY;  Surgeon: Karle Starch, MD;  Location: [facility];  Service: Ophthalmology;  Laterality: Bilateral;  . CHOLECYSTECTOMY    . HAMMER TOE SURGERY    . HERNIA REPAIR    . KNEE ARTHROSCOPY Bilateral   . PTOSIS REPAIR Bilateral 07/15/2015    Procedure: PTOSIS REPAIR;  Surgeon: Karle Starch, MD;  Location: [facility];  Service: Ophthalmology;  Laterality: Bilateral;  CPAP  . TONSILLECTOMY      There were no vitals filed for this visit.   Subjective Assessment - 07/16/20 1049    Subjective Patient reports overall is doing well. Her knees are still feeling about the same. She has been doing the exercises some and they are going fine.    Patient Stated Goals Be able to stand long enough to make banana pudding; be able to go up/down steps without having to only go up with RLE first    Currently in Pain? Yes    Pain Score 5     Pain Location Knee    Pain Orientation Right;Left    Pain Descriptors / Indicators Aching    Pain Type Chronic pain    Pain Onset More than a month ago    Pain Frequency Constant              [facility] PT Assessment - 07/16/20 0001      Assessment   Medical Diagnosis Bilateral primary osteoarthritis of knee    Referring Provider (PT)  Collier Salina, MD      Precautions   Precautions None  Restrictions   Weight Bearing Restrictions No      Balance Screen   Has the patient fallen in the past 6 months No    Has the patient had a decrease in activity level because of a fear of falling?  Yes    Is the patient reluctant to leave their home because of a fear of falling?  No   as long as she uses rollator     Prior Function   Level of Independence Independent with basic ADLs;Independent with household mobility with device;Independent with community mobility with device      Cognition   Overall Cognitive Status Within Functional Limits for tasks assessed      Observation/Other Assessments   Focus on Therapeutic Outcomes (FOTO)  60% limitation   predicted 49% limitation     AROM   Overall AROM Comments Patient limited with open chain left knee extension due to "catching," able to achieve full knee extension while supine and standing      Strength   Right Hip Flexion 4-/5     Left Hip Flexion 4-/5    Right Knee Flexion 4/5    Right Knee Extension 4/5    Left Knee Flexion 4/5    Left Knee Extension 4-/5      Transfers   Five time sit to stand comments  26 seconds without UE assist from 21" table      Ambulation/Gait   Ambulation/Gait Yes    Ambulation/Gait Assistance 6: Modified independent (Device/Increase time)    Ambulation Distance (Feet) 595 Feet    Assistive device Rollator    Gait Comments Patient with increased trunk flexion      6 minute walk test results    Aerobic Endurance Distance Walked 595    Endurance additional comments patient used rollator      Timed Up and Go Test   TUG Normal TUG    Normal TUG (seconds) 26                         [facility] Adult PT Treatment/Exercise - 07/16/20 0001      Self-Care   Self-Care Other Self-Care Comments    Other Self-Care Comments  POC, FOTO      Exercises   Exercises Knee/Hip      Knee/Hip Exercises: Aerobic   Nustep L3 x 6 min with UE/LE      Knee/Hip Exercises: Standing   Heel Raises 15 reps    Hip Flexion 10 reps    Hip Flexion Limitations alternating marching    Hip Abduction 15 reps      Knee/Hip Exercises: Seated   Long Arc Quad 15 reps    Long Arc Quad Limitations 3 sec hold at end range knee extension, left knee limited with report of catching    Sit to [employer] 2 sets;5 reps   standard height chair, UE support for stand and no-UE support for lowering                 PT Education - 07/16/20 1050    Education Details HEP, POC, FOTO    Person(s) Educated Patient    Methods Explanation    Comprehension Verbalized understanding            PT Short Term Goals - 07/16/20 1109      PT SHORT TERM GOAL #1   Title Patient will be independent with initial HEP.    Baseline Patient reports consistency with exercise -  achieved 07/16/20    Time 4    Period Weeks    Status Achieved    Target Date --      PT SHORT TERM GOAL #2   Title Patient will be able to  ambulate safely with CGA using SPC indoors on level ground for 25 feet.    Baseline Pt able to ambulate household distances using SPC. Uses rollator for community ambulation. - achieved 07/16/20    Time 4    Period Weeks    Status Achieved    Target Date --      PT SHORT TERM GOAL #3   Title Pt's FOTO score will improve from 62% limitation to 58% limitation or better to demonstrate increased tolerance with functional activities.    Baseline 60% limitation    Time 4    Period Weeks    Status On-going    Target Date 08/20/20      PT SHORT TERM GOAL #4   Title Pt will be able to stand for at least 15 minutes without requiring seated rest break to allow for meal preparation.    Baseline Pt reports she can stand 5-10 minutes    Time 4    Period Weeks    Status On-going    Target Date 08/20/20      PT SHORT TERM GOAL #5   Title Patient will be able to perform 5xSTS without BUE assist in < 27 seconds from 21.5 inch mat height or lower.    Baseline 25 seconds without UE assist from 21" table - achieved 07/16/20    Time 3    Period Weeks    Status Achieved    Target Date --             PT Long Term Goals - 07/16/20 1116      PT LONG TERM GOAL #1   Title Pt will be independent with advanced HEP.    Baseline Patient HEP progressed    Time 8    Period Weeks    Status On-going    Target Date 09/10/20      PT LONG TERM GOAL #2   Title Patient will be able to ambulate safely with CGA using SPC or LRAD indoors on level ground for 150 feet.    Baseline Patient able to ambulate 595 ft using rollator, can progress to using [place] if patient preference    Time 8    Period Weeks    Status On-going    Target Date 09/10/20      PT LONG TERM GOAL #3   Title Pt's FOTO score will improve from 62% limitation to 50% limitation or better to demonstrate increased tolerance with functional activities.    Baseline 60% limitation    Time 8    Period Weeks    Status On-going    Target Date  09/10/20      PT LONG TERM GOAL #4   Title Pt will be able to stand for at least 20 minutes without requiring seated rest break to allow for meal preparation.    Baseline Pt reports only able to stand 5-10 minutes due to knee pain and getting out of breath    Time 8    Period Weeks    Status Revised    Target Date 09/10/20      PT LONG TERM GOAL #5   Title Pt's TUG time will improve from 27.27 seconds with no AD to < 25 seconds with LRAD.  Baseline 26 seconds using SPC with PT managing portable O2    Time 8    Period Weeks    Status On-going    Target Date 09/10/20                 Plan - 07/16/20 1051    Clinical Impression Statement Patient tolerated therapy well with no adverse effects. She has made progress toward short and long term goals, achieving 3/5 STG but no LTG. She continues to have limitation in walking and standing tolerance due to knee pain/weakness but also report of shortness of breath limiting patient. She notes slight improvement in functional ability on FOTO and exhibits greater distance with 6MWT this visit. Patient will likely be using rollator for all community ambulation and is current not using AD for household ambulation, but would benefit from gait training using [place] for household mobility to improve stability. Patient would benefit from continued skilled PT to increase strength, endurance, and balance for improved safety and activity tolerance.    PT Treatment/Interventions ADLs/Self Care Home Management;Aquatic Therapy;Electrical Stimulation;Cryotherapy;Iontophoresis 61m/ml Dexamethasone;Moist Heat;DME Instruction;Neuromuscular re-education;Balance training;Therapeutic exercise;Therapeutic activities;Functional mobility training;Stair training;Gait training;Patient/family education;Manual techniques;Dry needling;Energy conservation;Passive range of motion;Taping    PT Next Visit Plan Review and update HEP PRN, continue gait training with SPC for household  mobility, BLE strengthening, consider aquatic therapy    PT Home Exercise Plan [id number]   Consulted and Agree with Plan of Care Patient           Patient will benefit from skilled therapeutic intervention in order to improve the following deficits and impairments:  Difficulty walking,Decreased range of motion,Decreased activity tolerance,Decreased balance,Decreased strength,Decreased mobility,Pain,Obesity,Improper body mechanics,Decreased endurance  Visit Diagnosis: Chronic pain of left knee  Chronic pain of right knee  Muscle weakness (generalized)  Difficulty in walking, not elsewhere classified  Stiffness of left knee, not elsewhere classified     Problem List Patient Active Problem List   Diagnosis Date Noted  . Atrial fibrillation ([facility] 07/11/2020  . HFrEF (heart failure with reduced ejection fraction) ([facility] 07/11/2020  . Positive ANA (antinuclear antibody) 05/01/2020  . Osteoarthritis 05/01/2020  . Bilateral primary osteoarthritis of knee 05/01/2020  . Aortic atherosclerosis ([facility] 04/18/2020  . COVID-19 virus infection 07/31/2019  . Bilateral knee pain 06/10/2019  . Leg swelling 02/13/2019  . 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 ([facility] 020/60/1561 . Physical deconditioning 02/13/2019  . Vitamin D deficiency 12/04/2018  . B12 deficiency 12/04/2018  . Hyperglycemia 11/16/2018  . Abdominal pain 06/02/2018  . Acute on chronic kidney failure ([facility] 12/12/2017  . Diastolic dysfunction 053/79/4327 . Endometrial ca ([facility] 11/30/2017  . GERD (gastroesophageal reflux disease) 11/30/2017  . HLD (hyperlipidemia) 11/30/2017  . CKD (chronic kidney disease) stage 3, GFR 30-59 ml/min (HCC) 11/30/2017  . Dyspnea on exertion 11/07/2017  . Cough 11/07/2017  . ILD (interstitial lung disease) ([facility] 03/15/2017  . Chronic respiratory failure with hypoxia ([facility] 03/15/2017  . Oral herpes simplex infection   . Influenza with pneumonia 06/28/2016  . Community acquired pneumonia  06/27/2016  . Hypertension 06/27/2016  . Depression 06/27/2016  . Sleep apnea 06/27/2016    PHYSICAL THERAPY DISCHARGE SUMMARY  Visits from Start of Care: 6  Current functional level related to goals / functional outcomes: See above   Remaining deficits: See above   Education / Equipment: See above  Plan: Patient agrees to discharge.  Patient goals were partially met. Patient is being discharged due to not returning since the last visit.  ?????  Haydee Monica, PT, DPT 08/27/20 3:18 AM   Hilda Blades, PT, DPT, LAT, ATC 07/16/20  11:52 AM Phone: [phone] Fax: [facility] [address] [place], [place], [address] Phone: [phone]   Fax:  [phone]  Name: Kalii Chesmore MRN: [id number] Date of Birth: 23-Jan-1941

## 2020-07-17 ENCOUNTER — Other Ambulatory Visit: Payer: Self-pay | Admitting: Internal Medicine

## 2020-07-22 ENCOUNTER — Ambulatory Visit: Payer: Medicare Other

## 2020-07-31 ENCOUNTER — Other Ambulatory Visit: Payer: Self-pay | Admitting: Internal Medicine

## 2020-08-04 ENCOUNTER — Other Ambulatory Visit: Payer: Self-pay

## 2020-08-04 ENCOUNTER — Ambulatory Visit: Payer: Medicare Other | Admitting: Cardiology

## 2020-08-04 ENCOUNTER — Encounter: Payer: Self-pay | Admitting: Cardiology

## 2020-08-04 VITALS — BP 116/91 | HR 103 | Temp 97.8°F | Resp 16 | Ht 60.0 in | Wt 219.0 lb

## 2020-08-04 DIAGNOSIS — I4819 Other persistent atrial fibrillation: Secondary | ICD-10-CM | POA: Diagnosis not present

## 2020-08-04 DIAGNOSIS — I5020 Unspecified systolic (congestive) heart failure: Secondary | ICD-10-CM | POA: Diagnosis not present

## 2020-08-04 MED ORDER — METOPROLOL SUCCINATE ER 50 MG PO TB24: 50.0000 mg | ORAL_TABLET | Freq: Every day | ORAL | 3 refills | Status: DC

## 2020-08-04 MED ORDER — DILTIAZEM HCL ER COATED BEADS 120 MG PO CP24
120.0000 mg | ORAL_CAPSULE | Freq: Every day | ORAL | 3 refills | Status: DC
Start: 2020-08-04 — End: 2021-03-23

## 2020-08-04 MED ORDER — APIXABAN 5 MG PO TABS: 5.0000 mg | ORAL_TABLET | Freq: Two times a day (BID) | ORAL | 3 refills | Status: DC

## 2020-08-04 NOTE — Progress Notes (Signed)
Patient referred by Biagio Borg, MD for pulmonary hypertension  Subjective:   Kathy [name], female    DOB: March 04, 1941, 80 y.o.   MRN: [id number]   Chief Complaint  Patient presents with  . Atrial Fibrillation  . Follow-up    4 week     HPI  80 y.o. Caucasian female with hypertension, hyperlipidemia, interstitial lung disease, on 2 L O2, h/o COVID 06/2019, referred for management of Afib, cardiomyopathy, pulmonary hypertension  Patient has noticed increased sputum production lately. She denies chest pain, prthopnea, PND, Leg swelling is mild and stable.   Initial consultation HPI 06/2020: Patient has been on 2 L oxygen for at least 1-2 years. COVID-19 in 2021 probably caused progression of her baseline ILD, according to Dr. Vaughan Browner. Currently, she is limited to walking inside the house with stable exertional dyspnea. She denies any chest pain. She was noted to be in Afib on echocardiogram in 01/2020, although I do not see any prior EKG's. Echocardiogram showed LVEF 45-50%, mild LA dilatation, estimated PASP 36 mmHg. She reports occasional lightheadedness, but denies any presyncope, syncope, chest pain. Leg edema is currently well controlled. She denies orthopnea, PND.     Current Outpatient Medications on File Prior to Visit  Medication Sig Dispense Refill  . albuterol (VENTOLIN HFA) 108 (90 Base) MCG/ACT inhaler Inhale 1-2 puffs into the lungs every 6 (six) hours as needed for wheezing or shortness of breath. 1 Inhaler 11  . apixaban (ELIQUIS) 2.5 MG TABS tablet Take 2 tablets (5 mg total) by mouth 2 (two) times daily. 60 tablet 3  . benzonatate (TESSALON) 100 MG capsule Take 1 capsule (100 mg total) by mouth every 8 (eight) hours. (Patient not taking: Reported on 07/11/2020) 21 capsule 0  . Cholecalciferol (VITAMIN D-3) 25 MCG (1000 UT) CAPS Take 1 capsule by mouth daily.    . cyanocobalamin (,VITAMIN B-12,) 1000 MCG/ML injection INJECT 1 ML IM EVERY 30 DAYS 3 mL 1  . diltiazem  (CARDIZEM CD) 120 MG 24 hr capsule Take 1 capsule (120 mg total) by mouth daily. 30 capsule 3  . fenofibrate micronized (LOFIBRA) 134 MG capsule TAKE 1 CAPSULE BY MOUTH EVERY DAY IN THE MORNING BEFORE BREAKFAST 90 capsule 1  . furosemide (LASIX) 40 MG tablet 1 tab by mouth in the AM, and 1 tab by mouth in the PM as needed for persistent swelling or weight gain more than 3-5 lbs 60 tablet 11  . losartan (COZAAR) 100 MG tablet TAKE 1/2 (ONE HALF) TABLET BY MOUTH ONCE DAILY 45 tablet 2  . meloxicam (MOBIC) 15 MG tablet TAKE 1 TABLET BY MOUTH ONCE DAILY 90 tablet 1  . metoprolol succinate (TOPROL-XL) 50 MG 24 hr tablet TAKE 1 TABLET BY MOUTH ONCE DAILY, TAKE WITH OR IMMEDIATELY FOLLOWING A MEAL 90 tablet 1  . pantoprazole (PROTONIX) 40 MG tablet Take 1 tablet (40 mg total) by mouth daily. 90 tablet 3  . Respiratory Therapy Supplies (FLUTTER) DEVI Use as directed 1 each 0  . triamcinolone cream (KENALOG) 0.5 % APPLY TO AFFECTED AREA TWICE A DAY 30 g 1   No current facility-administered medications on file prior to visit.    Cardiovascular and other pertinent studies:  EKG 08/04/2020: Atrial fibrillation 111 bpm Low voltage in precordial leads  RSR(V1) -nondiagnostic Nonspecific T-abnormality  EKG 07/11/2020: Atrial fibrillation 120 bpm Nonspecific T-abnormality  Echocardiogram 02/18/2020: 1. Left ventricular ejection fraction, by estimation, is 45 to 50%. The  left ventricle has mildly decreased function. The  left ventricle  demonstrates global hypokinesis. Left ventricular diastolic parameters are  indeterminate.  2. Right ventricular systolic function is normal. The right ventricular  size is normal. There is mildly elevated pulmonary artery systolic  pressure. The estimated right ventricular systolic pressure is 29.9 mmHg.  3. Left atrial size was mildly dilated.  4. The mitral valve is normal in structure. No evidence of mitral valve  regurgitation. No evidence of mitral stenosis.   5. The aortic valve is tricuspid. Aortic valve regurgitation is not  visualized. Mild aortic valve sclerosis is present, with no evidence of  aortic valve stenosis.  6. The inferior vena cava is normal in size with <50% respiratory  variability, suggesting right atrial pressure of 8 mmHg.  7. The patient appeared to be in atrial fibrillation with mild RVR.   CT Chest 02/08/2020: 1. 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 Findings may be minimally progressive from 01/11/2018 and the possibility of superimposed post COVID-19 inflammatory fibrosis cannot be excluded. Findings are suggestive of an alternative diagnosis (not UIP) per consensus guidelines: Diagnosis of Idiopathic Pulmonary Fibrosis: An Official ATS/ERS/JRS/ALAT Clinical Practice Guideline. [place], Iss 5, [phone], Feb 26 2017. 2. Aortic atherosclerosis (ICD10-I70.0). Coronary artery calcification. 3. Enlarged pulmonic trunk, indicative of pulmonary arterial hypertension.    Recent labs: 12/18/2019: Glucose 94, BUN/Cr 24/1.19. EGFR 43. Na/K 138/4.0. Rest of the CMP normal H/H 11/34. MCV 96. Platelets 216 Chol 130, TG 74, HDL 47, LDL 68    Review of Systems  Cardiovascular: Positive for dyspnea on exertion. Negative for chest pain, leg swelling, palpitations and syncope.  Respiratory: Positive for shortness of breath.   Neurological: Positive for light-headedness.         Vitals:   08/04/20 1453 08/04/20 1500  BP: (!) 136/100 (!) 157/91  Pulse: 86 93  Resp: 16   Temp: 97.8 F (36.6 C)   SpO2: 97%      Body mass index is 42.77 kg/m. Filed Weights   08/04/20 1453  Weight: 219 lb (99.3 kg)      Objective:   Physical Exam Vitals and nursing note reviewed.  Constitutional:      General: She is not in acute distress. Neck:     Vascular: No JVD.  Cardiovascular:     Rate and Rhythm: Normal rate and regular  rhythm.     Heart sounds: Normal heart sounds. No murmur heard.   Pulmonary:     Effort: Pulmonary effort is normal.     Breath sounds: Normal breath sounds. No wheezing or rales.          Assessment & Recommendations:   80 y.o. Caucasian female with hypertension, hyperlipidemia, interstitial lung disease, on 2 L O2, h/o COVID 06/2019, referred for management of Afib, cardiomyopathy, pulmonary hypertension  Pulmonary hypertension: [place] elevated of PASP 36 mmHg on echocardiogram 01/2020. 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 Recommend continued management of underlying medical comorbidities. Low suspicion for WHO grp I PH/PAH.  Persistent Afib: Rate fairly well controlled on diltiazem 120 mg, conitnue metoprolol succinate 50 mg daily. CHA2DS2VASc score 4, annual stroke risk 5%.  Continue eliquis 5 mg bid.  I have asked her to minimize use of meloxicam (for arthritis) by using tylenol Although she has coronary atherosclerosis, she does not have any angina symptoms at this time, thus do not recommend ischemia testing.   HFrEF:  Mild. Clinically euvolumic. Probably related to Afib. Conitnue losartan, metoprolol.  Continue lasix 1/2 og 40 mg a day. She may use 40 mg on days of more than usual leg swelling  F/u wDr. Mannam re: increased sputum production F/u in 4 weeks  Thank you for referring the patient to [place]. Please feel free to contact with any questions.   Nigel Mormon, MD Pager: [phone] Office: [phone]

## 2020-11-03 ENCOUNTER — Other Ambulatory Visit: Payer: Self-pay

## 2020-11-03 ENCOUNTER — Encounter: Payer: Self-pay | Admitting: Cardiology

## 2020-11-03 ENCOUNTER — Ambulatory Visit: Payer: Medicare Other | Admitting: Cardiology

## 2020-11-03 VITALS — BP 141/104 | HR 63 | Temp 98.0°F | Resp 16 | Ht 60.0 in | Wt 226.0 lb

## 2020-11-03 DIAGNOSIS — I4819 Other persistent atrial fibrillation: Secondary | ICD-10-CM | POA: Diagnosis not present

## 2020-11-03 DIAGNOSIS — I5020 Unspecified systolic (congestive) heart failure: Secondary | ICD-10-CM | POA: Diagnosis not present

## 2020-11-03 DIAGNOSIS — I10 Essential (primary) hypertension: Secondary | ICD-10-CM | POA: Diagnosis not present

## 2020-11-03 NOTE — Progress Notes (Signed)
Patient referred by Biagio Borg, MD for pulmonary hypertension  Subjective:   Kathy [name], female    DOB: 06-Dec-1940, 80 y.o.   MRN: [id number]   Chief Complaint  Patient presents with  . Atrial Fibrillation  . Follow-up    3 month      HPI  80 y.o. Caucasian female with hypertension, hyperlipidemia, interstitial lung disease, on 2 L O2, h/o COVID 06/2019, referred for management of Afib, cardiomyopathy, pulmonary hypertension  Patient has had stable exertional dyspnea symptoms.  She is quite out of breath after climbing up stairs to our office today.  She continues on 2-3 L home oxygen.  She is not seeing a pulmonologist in several months.  Blood pressure is elevated today.  Initial consultation HPI 06/2020: Patient has been on 2 L oxygen for at least 1-2 years. COVID-19 in 2021 probably caused progression of her baseline ILD, according to Dr. Vaughan Browner. Currently, she is limited to walking inside the house with stable exertional dyspnea. She denies any chest pain. She was noted to be in Afib on echocardiogram in 01/2020, although I do not see any prior EKG's. Echocardiogram showed LVEF 45-50%, mild LA dilatation, estimated PASP 36 mmHg. She reports occasional lightheadedness, but denies any presyncope, syncope, chest pain. Leg edema is currently well controlled. She denies orthopnea, PND.     Current Outpatient Medications on File Prior to Visit  Medication Sig Dispense Refill  . albuterol (VENTOLIN HFA) 108 (90 Base) MCG/ACT inhaler Inhale 1-2 puffs into the lungs every 6 (six) hours as needed for wheezing or shortness of breath. 1 Inhaler 11  . apixaban (ELIQUIS) 5 MG TABS tablet Take 1 tablet (5 mg total) by mouth 2 (two) times daily. 180 tablet 3  . Cholecalciferol (VITAMIN D-3) 25 MCG (1000 UT) CAPS Take 1 capsule by mouth daily.    . cyanocobalamin (,VITAMIN B-12,) 1000 MCG/ML injection INJECT 1 ML IM EVERY 30 DAYS 3 mL 1  . diltiazem (CARDIZEM CD) 120 MG 24 hr capsule Take 1  capsule (120 mg total) by mouth daily. 90 capsule 3  . fenofibrate micronized (LOFIBRA) 134 MG capsule TAKE 1 CAPSULE BY MOUTH EVERY DAY IN THE MORNING BEFORE BREAKFAST 90 capsule 1  . furosemide (LASIX) 40 MG tablet 1 tab by mouth in the AM, and 1 tab by mouth in the PM as needed for persistent swelling or weight gain more than 3-5 lbs 60 tablet 11  . losartan (COZAAR) 100 MG tablet TAKE 1/2 (ONE HALF) TABLET BY MOUTH ONCE DAILY 45 tablet 2  . metoprolol succinate (TOPROL-XL) 50 MG 24 hr tablet Take 1 tablet (50 mg total) by mouth daily. Take with or immediately following a meal. 90 tablet 3  . pantoprazole (PROTONIX) 40 MG tablet Take 1 tablet (40 mg total) by mouth daily. 90 tablet 3  . Respiratory Therapy Supplies (FLUTTER) DEVI Use as directed 1 each 0  . triamcinolone cream (KENALOG) 0.5 % APPLY TO AFFECTED AREA TWICE A DAY 30 g 1  . meloxicam (MOBIC) 15 MG tablet Take 1 tablet by mouth daily.     No current facility-administered medications on file prior to visit.    Cardiovascular and other pertinent studies:  EKG 11/03/2020: Atrial fibrillation 116 bpm  Possible old anteroseptal infarct Nonspecific T-abnormality  EKG 08/04/2020: Atrial fibrillation 111 bpm Low voltage in precordial leads  RSR(V1) -nondiagnostic Nonspecific T-abnormality  EKG 07/11/2020: Atrial fibrillation 120 bpm Nonspecific T-abnormality  Echocardiogram 02/18/2020: 1. Left ventricular ejection fraction, by estimation,  is 45 to 50%. The  left ventricle has mildly decreased function. The left ventricle  demonstrates global hypokinesis. Left ventricular diastolic parameters are  indeterminate.  2. Right ventricular systolic function is normal. The right ventricular  size is normal. There is mildly elevated pulmonary artery systolic  pressure. The estimated right ventricular systolic pressure is 93.2 mmHg.  3. Left atrial size was mildly dilated.  4. The mitral valve is normal in structure. No evidence of  mitral valve  regurgitation. No evidence of mitral stenosis.  5. The aortic valve is tricuspid. Aortic valve regurgitation is not  visualized. Mild aortic valve sclerosis is present, with no evidence of  aortic valve stenosis.  6. The inferior vena cava is normal in size with <50% respiratory  variability, suggesting right atrial pressure of 8 mmHg.  7. The patient appeared to be in atrial fibrillation with mild RVR.   CT Chest 02/08/2020: 1. 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 Findings may be minimally progressive from 01/11/2018 and the possibility of superimposed post COVID-19 inflammatory fibrosis cannot be excluded. Findings are suggestive of an alternative diagnosis (not UIP) per consensus guidelines: Diagnosis of Idiopathic Pulmonary Fibrosis: An Official ATS/ERS/JRS/ALAT Clinical Practice Guideline. [place], Iss 5, [phone], Feb 26 2017. 2. Aortic atherosclerosis (ICD10-I70.0). Coronary artery calcification. 3. Enlarged pulmonic trunk, indicative of pulmonary arterial hypertension.    Recent labs: 12/18/2019: Glucose 94, BUN/Cr 24/1.19. EGFR 43. Na/K 138/4.0. Rest of the CMP normal H/H 11/34. MCV 96. Platelets 216 Chol 130, TG 74, HDL 47, LDL 68    Review of Systems  Cardiovascular: Positive for dyspnea on exertion. Negative for chest pain, leg swelling, palpitations and syncope.  Respiratory: Positive for shortness of breath.          Vitals:   11/03/20 1313 11/03/20 1325  BP: (!) 176/96 (!) 141/104  Pulse: 69 63  Resp:    Temp:    SpO2:  94%     Body mass index is 44.14 kg/m. Filed Weights   11/03/20 1307  Weight: 226 lb (102.5 kg)      Objective:   Physical Exam Vitals and nursing note reviewed.  Constitutional:      General: She is not in acute distress. Neck:     Vascular: No JVD.  Cardiovascular:     Rate and Rhythm: Tachycardia present. Rhythm  irregular.     Heart sounds: Normal heart sounds. No murmur heard.   Pulmonary:     Effort: Pulmonary effort is normal.     Breath sounds: Normal breath sounds. No wheezing or rales.          Assessment & Recommendations:   80 y.o. Caucasian female with hypertension, hyperlipidemia, interstitial lung disease, on 2 L O2, h/o COVID 06/2019, referred for management of Afib, cardiomyopathy, pulmonary hypertension  Pulmonary hypertension: Modestly elevated of PASP 36 mmHg on echocardiogram 01/2020. 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 Recommend continued management of underlying medical comorbidities. Low suspicion for WHO grp I PH/PAH.  Hypertension: Blood pressure elevated today.  Possibly related to exertion.  Reevaluate in 4 weeks.  No change made today.  Persistent Afib: Rate uncontrolled today, possibly related to exertion. Abnormality change to heart rate control regimen, that includes metoprolol succinate 50 mg daily, and diltiazem 120 mg daily. I encouraged her to use a smart watch or device such as cardia to monitor  her A. fib rate at home. I will see her again in 4 weeks to reevaluate refill rate controlled. CHA2DS2VASc score 4, annual stroke risk 5%.  Continue eliquis 5 mg bid.  Patient assistance information provided to the patient.   Although she has coronary atherosclerosis, she does not have any angina symptoms at this time, thus do not recommend ischemia testing.   HFrEF: Mildly reduced EF 45-50%. Clinically euvolumic. Probably related to Afib. Conitnue losartan, metoprolol.  Continue lasix 40 mg a day.   Follow-up in 4 weeks to reevaluate hypertension and A. fib rate controlled.    Nigel Mormon, MD Pager: [phone] Office: [phone]

## 2020-12-04 ENCOUNTER — Ambulatory Visit: Payer: Medicare Other | Admitting: Cardiology

## 2020-12-04 ENCOUNTER — Other Ambulatory Visit: Payer: Self-pay

## 2020-12-04 ENCOUNTER — Encounter: Payer: Self-pay | Admitting: Cardiology

## 2020-12-04 VITALS — BP 119/87 | HR 111 | Temp 98.0°F | Resp 16 | Ht 60.0 in | Wt 219.0 lb

## 2020-12-04 DIAGNOSIS — I272 Pulmonary hypertension, unspecified: Secondary | ICD-10-CM | POA: Insufficient documentation

## 2020-12-04 DIAGNOSIS — I4819 Other persistent atrial fibrillation: Secondary | ICD-10-CM | POA: Diagnosis not present

## 2020-12-04 DIAGNOSIS — I10 Essential (primary) hypertension: Secondary | ICD-10-CM | POA: Diagnosis not present

## 2020-12-04 DIAGNOSIS — I5020 Unspecified systolic (congestive) heart failure: Secondary | ICD-10-CM | POA: Diagnosis not present

## 2020-12-04 NOTE — Progress Notes (Signed)
Patient referred by Biagio Borg, MD for pulmonary hypertension  Subjective:   Kathy [name], female    DOB: Feb 13, 1941, 80 y.o.   MRN: [id number]   Chief Complaint  Patient presents with   Persistent atrial fibrillation    Follow-up    4 week      HPI  80 y.o. Caucasian female with hypertension, hyperlipidemia, interstitial lung disease, on 2 L O2, h/o COVID 06/2019, persistent Afib, pulmonary hypertension  Patient is doing well today. Denies more than usual dyspnea. Blood pressure better controlled.   Initial consultation HPI 06/2020: Patient has been on 2 L oxygen for at least 1-2 years. COVID-19 in 2021 probably caused progression of her baseline ILD, according to Dr. Vaughan Browner. Currently, she is limited to walking inside the house with stable exertional dyspnea. She denies any chest pain. She was noted to be in Afib on echocardiogram in 01/2020, although I do not see any prior EKG's. Echocardiogram showed LVEF 45-50%, mild LA dilatation, estimated PASP 36 mmHg. She reports occasional lightheadedness, but denies any presyncope, syncope, chest pain. Leg edema is currently well controlled. She denies orthopnea, PND.     Current Outpatient Medications on File Prior to Visit  Medication Sig Dispense Refill   albuterol (VENTOLIN HFA) 108 (90 Base) MCG/ACT inhaler Inhale 1-2 puffs into the lungs every 6 (six) hours as needed for wheezing or shortness of breath. 1 Inhaler 11   apixaban (ELIQUIS) 5 MG TABS tablet Take 1 tablet (5 mg total) by mouth 2 (two) times daily. 180 tablet 3   Cholecalciferol (VITAMIN D-3) 25 MCG (1000 UT) CAPS Take 1 capsule by mouth daily.     cyanocobalamin (,VITAMIN B-12,) 1000 MCG/ML injection INJECT 1 ML IM EVERY 30 DAYS 3 mL 1   fenofibrate micronized (LOFIBRA) 134 MG capsule TAKE 1 CAPSULE BY MOUTH EVERY DAY IN THE MORNING BEFORE BREAKFAST 90 capsule 1   furosemide (LASIX) 40 MG tablet 1 tab by mouth in the AM, and 1 tab by mouth in the PM as needed for  persistent swelling or weight gain more than 3-5 lbs 60 tablet 11   losartan (COZAAR) 100 MG tablet TAKE 1/2 (ONE HALF) TABLET BY MOUTH ONCE DAILY 45 tablet 2   meloxicam (MOBIC) 15 MG tablet Take 1 tablet by mouth daily.     metoprolol succinate (TOPROL-XL) 50 MG 24 hr tablet Take 1 tablet (50 mg total) by mouth daily. Take with or immediately following a meal. 90 tablet 3   pantoprazole (PROTONIX) 40 MG tablet Take 1 tablet (40 mg total) by mouth daily. 90 tablet 3   Respiratory Therapy Supplies (FLUTTER) DEVI Use as directed 1 each 0   triamcinolone cream (KENALOG) 0.5 % APPLY TO AFFECTED AREA TWICE A DAY 30 g 1   diltiazem (CARDIZEM CD) 120 MG 24 hr capsule Take 1 capsule (120 mg total) by mouth daily. 90 capsule 3   No current facility-administered medications on file prior to visit.    Cardiovascular and other pertinent studies:  EKG 11/03/2020: Atrial fibrillation 116 bpm  Possible old anteroseptal infarct Nonspecific T-abnormality  EKG 08/04/2020: Atrial fibrillation 111 bpm Low voltage in precordial leads  RSR(V1) -nondiagnostic Nonspecific T-abnormality  EKG 07/11/2020: Atrial fibrillation 120 bpm Nonspecific T-abnormality  Echocardiogram 02/18/2020: 1. Left ventricular ejection fraction, by estimation, is 45 to 50%. The  left ventricle has mildly decreased function. The left ventricle  demonstrates global hypokinesis. Left ventricular diastolic parameters are  indeterminate.   2. Right ventricular systolic function  is normal. The right ventricular  size is normal. There is mildly elevated pulmonary artery systolic  pressure. The estimated right ventricular systolic pressure is 16.3 mmHg.   3. Left atrial size was mildly dilated.   4. The mitral valve is normal in structure. No evidence of mitral valve  regurgitation. No evidence of mitral stenosis.   5. The aortic valve is tricuspid. Aortic valve regurgitation is not  visualized. Mild aortic valve sclerosis is present,  with no evidence of  aortic valve stenosis.   6. The inferior vena cava is normal in size with <50% respiratory  variability, suggesting right atrial pressure of 8 mmHg.   7. The patient appeared to be in atrial fibrillation with mild RVR.   CT Chest 02/08/2020: 1. 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 Findings may be minimally progressive from 01/11/2018 and the possibility of superimposed post COVID-19 inflammatory fibrosis cannot be excluded. Findings are suggestive of an alternative diagnosis (not UIP) per consensus guidelines: Diagnosis of Idiopathic Pulmonary Fibrosis: An Official ATS/ERS/JRS/ALAT Clinical Practice Guideline. [place], Iss 5, [phone], Feb 26 2017. 2. Aortic atherosclerosis (ICD10-I70.0). Coronary artery calcification. 3. Enlarged pulmonic trunk, indicative of pulmonary arterial hypertension.     Recent labs: 12/18/2019: Glucose 94, BUN/Cr 24/1.19. EGFR 43. Na/K 138/4.0. Rest of the CMP normal H/H 11/34. MCV 96. Platelets 216 Chol 130, TG 74, HDL 47, LDL 68    ROS       Vitals:   12/04/20 1403  BP: 119/87  Pulse: (!) 111  Resp: 16  Temp: 98 F (36.7 C)  SpO2: 98%    Body mass index is 42.77 kg/m. Filed Weights   12/04/20 1403  Weight: 219 lb (99.3 kg)      Objective:   Physical Exam Cardiovascular:     Rate and Rhythm: Tachycardia present. Rhythm irregular.         Assessment & Recommendations:   80 y.o. Caucasian female with hypertension, hyperlipidemia, interstitial lung disease, on 2 L O2, h/o COVID 06/2019, persistent Afib, pulmonary hypertension   Persistent Afib: Rate around 100-110 bpm, which is acceptable. Continue metoprolol succinate 50 mg daily, and diltiazem 120 mg daily. I encouraged her to use a smart watch or device such as cardia to monitor her A. fib rate at home. I will see her again in 4 weeks to reevaluate refill rate  controlled. CHA2DS2VASc score 4, annual stroke risk 5%.  Continue eliquis 5 mg bid.  Patient assistance information provided to the patient.   Although she has coronary atherosclerosis, she does not have any angina symptoms at this time, thus do not recommend ischemia testing.   HFrEF: Mildly reduced EF 45-50%. Clinically euvolumic. Probably related to Afib. Conitnue losartan, metoprolol.  Continue lasix 40 mg a day.   Pulmonary hypertension: Modestly elevated of PASP 36 mmHg on echocardiogram 01/2020. 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 Recommend continued management of underlying medical comorbidities. Low suspicion for WHO grp I PH/PAH.  Hypertension: Controlled  F/u in 6 months    Nigel Mormon, MD Pager: [phone] Office: [phone]

## 2020-12-30 ENCOUNTER — Other Ambulatory Visit: Payer: Self-pay | Admitting: Internal Medicine

## 2021-01-01 DIAGNOSIS — Z20822 Contact with and (suspected) exposure to covid-19: Secondary | ICD-10-CM | POA: Diagnosis not present

## 2021-03-02 ENCOUNTER — Other Ambulatory Visit: Payer: Self-pay | Admitting: Internal Medicine

## 2021-03-02 NOTE — Telephone Encounter (Signed)
Please refill as per office routine med refill policy (all routine meds to be refilled for 3 mo or monthly (per pt preference) up to one year from last visit, then month to month grace period for 3 mo, then further med refills will have to be denied) ? ?

## 2021-03-18 ENCOUNTER — Ambulatory Visit: Payer: Medicare Other | Admitting: Internal Medicine

## 2021-03-23 ENCOUNTER — Encounter: Payer: Self-pay | Admitting: Internal Medicine

## 2021-03-23 ENCOUNTER — Other Ambulatory Visit: Payer: Self-pay

## 2021-03-23 ENCOUNTER — Ambulatory Visit (INDEPENDENT_AMBULATORY_CARE_PROVIDER_SITE_OTHER): Payer: Medicare Other | Admitting: Internal Medicine

## 2021-03-23 ENCOUNTER — Other Ambulatory Visit: Payer: Self-pay | Admitting: Internal Medicine

## 2021-03-23 VITALS — BP 126/90 | HR 104 | Temp 97.6°F | Resp 18 | Ht 60.0 in | Wt 225.6 lb

## 2021-03-23 DIAGNOSIS — E559 Vitamin D deficiency, unspecified: Secondary | ICD-10-CM | POA: Diagnosis not present

## 2021-03-23 DIAGNOSIS — E7800 Pure hypercholesterolemia, unspecified: Secondary | ICD-10-CM | POA: Diagnosis not present

## 2021-03-23 DIAGNOSIS — J849 Interstitial pulmonary disease, unspecified: Secondary | ICD-10-CM

## 2021-03-23 DIAGNOSIS — I700 Atherosclerosis of aorta: Secondary | ICD-10-CM

## 2021-03-23 DIAGNOSIS — G609 Hereditary and idiopathic neuropathy, unspecified: Secondary | ICD-10-CM | POA: Diagnosis not present

## 2021-03-23 DIAGNOSIS — E538 Deficiency of other specified B group vitamins: Secondary | ICD-10-CM

## 2021-03-23 DIAGNOSIS — N183 Chronic kidney disease, stage 3 unspecified: Secondary | ICD-10-CM

## 2021-03-23 DIAGNOSIS — R739 Hyperglycemia, unspecified: Secondary | ICD-10-CM

## 2021-03-23 DIAGNOSIS — G629 Polyneuropathy, unspecified: Secondary | ICD-10-CM | POA: Insufficient documentation

## 2021-03-23 DIAGNOSIS — I4819 Other persistent atrial fibrillation: Secondary | ICD-10-CM | POA: Diagnosis not present

## 2021-03-23 DIAGNOSIS — I10 Essential (primary) hypertension: Secondary | ICD-10-CM | POA: Diagnosis not present

## 2021-03-23 LAB — BASIC METABOLIC PANEL
BUN: 27 mg/dL — ABNORMAL HIGH (ref 6–23)
CO2: 29 mEq/L (ref 19–32)
Calcium: 9.6 mg/dL (ref 8.4–10.5)
Chloride: 102 mEq/L (ref 96–112)
Creatinine, Ser: 1.31 mg/dL — ABNORMAL HIGH (ref 0.40–1.20)
GFR: 38.54 mL/min — ABNORMAL LOW (ref >60.00)
Glucose, Bld: 124 mg/dL — ABNORMAL HIGH (ref 70–99)
Potassium: 4.2 mEq/L (ref 3.5–5.1)
Sodium: 139 mEq/L (ref 135–145)

## 2021-03-23 LAB — URINALYSIS, ROUTINE W REFLEX MICROSCOPIC
Bilirubin Urine: NEGATIVE
Ketones, ur: NEGATIVE
Nitrite: POSITIVE — AB
Specific Gravity, Urine: 1.025 (ref 1.000–1.030)
Urine Glucose: NEGATIVE
Urobilinogen, UA: 1 (ref 0.0–1.0)
pH: 6 (ref 5.0–8.0)

## 2021-03-23 LAB — HEPATIC FUNCTION PANEL
ALT: 9 U/L (ref 0–35)
AST: 21 U/L (ref 0–37)
Albumin: 4 g/dL (ref 3.5–5.2)
Alkaline Phosphatase: 48 U/L (ref 39–117)
Bilirubin, Direct: 0.3 mg/dL (ref 0.0–0.3)
Total Bilirubin: 0.8 mg/dL (ref 0.2–1.2)
Total Protein: 7.5 g/dL (ref 6.0–8.3)

## 2021-03-23 LAB — HEMOGLOBIN A1C: Hgb A1c MFr Bld: 5.5 % (ref 4.6–6.5)

## 2021-03-23 LAB — TSH: TSH: 2.18 u[IU]/mL (ref 0.35–5.50)

## 2021-03-23 LAB — CBC WITH DIFFERENTIAL/PLATELET
Basophils Absolute: 0 10*3/uL (ref 0.0–0.1)
Basophils Relative: 0.6 % (ref 0.0–3.0)
Eosinophils Absolute: 0.1 10*3/uL (ref 0.0–0.7)
Eosinophils Relative: 1.5 % (ref 0.0–5.0)
HCT: 38.4 % (ref 36.0–46.0)
Hemoglobin: 12.9 g/dL (ref 12.0–15.0)
Lymphocytes Relative: 24.5 % (ref 12.0–46.0)
Lymphs Abs: 1.4 10*3/uL (ref 0.7–4.0)
MCHC: 33.5 g/dL (ref 30.0–36.0)
MCV: 96.9 fl (ref 78.0–100.0)
Monocytes Absolute: 0.4 10*3/uL (ref 0.1–1.0)
Monocytes Relative: 6.4 % (ref 3.0–12.0)
Neutro Abs: 3.9 10*3/uL (ref 1.4–7.7)
Neutrophils Relative %: 67 % (ref 43.0–77.0)
Platelets: 172 10*3/uL (ref 150.0–400.0)
RBC: 3.97 Mil/uL (ref 3.87–5.11)
RDW: 13.6 % (ref 11.5–15.5)
WBC: 5.8 10*3/uL (ref 4.0–10.5)

## 2021-03-23 LAB — LIPID PANEL
Cholesterol: 146 mg/dL (ref 0–200)
HDL: 53.8 mg/dL (ref >39.00)
LDL Cholesterol: 78 mg/dL (ref 0–99)
NonHDL: 92.28
Total CHOL/HDL Ratio: 3
Triglycerides: 71 mg/dL (ref 0.0–149.0)
VLDL: 14.2 mg/dL (ref 0.0–40.0)

## 2021-03-23 LAB — VITAMIN D 25 HYDROXY (VIT D DEFICIENCY, FRACTURES): VITD: 37.71 ng/mL (ref 30.00–100.00)

## 2021-03-23 LAB — VITAMIN B12: Vitamin B-12: 419 pg/mL (ref 211–911)

## 2021-03-23 MED ORDER — METOPROLOL SUCCINATE ER 50 MG PO TB24: 50.0000 mg | ORAL_TABLET | Freq: Every day | ORAL | 3 refills | Status: DC

## 2021-03-23 MED ORDER — CEPHALEXIN 500 MG PO CAPS: 500.0000 mg | ORAL_CAPSULE | Freq: Three times a day (TID) | ORAL | 0 refills | Status: AC

## 2021-03-23 MED ORDER — AMITRIPTYLINE HCL 100 MG PO TABS: ORAL_TABLET | ORAL | 1 refills | Status: DC

## 2021-03-23 MED ORDER — LOSARTAN POTASSIUM 100 MG PO TABS: ORAL_TABLET | ORAL | 3 refills | Status: DC

## 2021-03-23 MED ORDER — FENOFIBRATE MICRONIZED 134 MG PO CAPS: ORAL_CAPSULE | ORAL | 3 refills | Status: DC

## 2021-03-23 MED ORDER — ALBUTEROL SULFATE HFA 108 (90 BASE) MCG/ACT IN AERS: 1.0000 | INHALATION_SPRAY | Freq: Four times a day (QID) | RESPIRATORY_TRACT | 11 refills | Status: DC | PRN

## 2021-03-23 MED ORDER — APIXABAN 5 MG PO TABS: 5.0000 mg | ORAL_TABLET | Freq: Two times a day (BID) | ORAL | 3 refills | Status: DC

## 2021-03-23 MED ORDER — PANTOPRAZOLE SODIUM 40 MG PO TBEC: 40.0000 mg | DELAYED_RELEASE_TABLET | Freq: Every day | ORAL | 3 refills | Status: DC

## 2021-03-23 MED ORDER — FUROSEMIDE 40 MG PO TABS: ORAL_TABLET | ORAL | 11 refills | Status: DC

## 2021-03-23 MED ORDER — DILTIAZEM HCL ER COATED BEADS 120 MG PO CP24: 120.0000 mg | ORAL_CAPSULE | Freq: Every day | ORAL | 3 refills | Status: DC

## 2021-03-23 NOTE — Patient Instructions (Addendum)
You had the flu shot today  Please consider the Novavax covid vaccine next month at the pharmacy  Please take OTC Vitamin D3 at 2000 units per day, indefinitely  Please take all new medication as prescribed - the generic for elavil up to 100 mg at bedtime for sleep and the feet pain  Please continue all other medications as before, and refills have been done if requested.  Please have the pharmacy call with any other refills you may need.  Please continue your efforts at being more active, low cholesterol diet, and weight control.  You are otherwise up to date with prevention measures today.  Please keep your appointments with your specialists as you may have planned  Please go to the LAB at the blood drawing area for the tests to be done  You will be contacted by phone if any changes need to be made immediately.  Otherwise, you will receive a letter about your results with an explanation, but please check with MyChart first.  Please remember to sign up for MyChart if you have not done so, as this will be important to you in the future with finding out test results, communicating by private email, and scheduling acute appointments online when needed.  Please make an Appointment to return in 6 months, or sooner if needed

## 2021-03-23 NOTE — Progress Notes (Signed)
Patient ID: Kathy [name], female   DOB: 01-Sep-1940, 80 y.o.   MRN: [id number]        Chief Complaint: follow up low vit d, periph neuropathy, ILD, htn, hld, ckd, hyperglycmia, low b12       HPI:  Kathy [name] is a 80 y.o. female here overall doing ok, but has worsening numb discomfort to the distal legs and feet that make her up for hours at night unable to sleep.  No worsening LBP.  Not taking vit D.  Pt denies chest pain, increased sob or doe, wheezing, orthopnea, PND, increased LE swelling, palpitations, dizziness or syncope.   Pt denies polydipsia, polyuria, or new focal neuro s/s.  Trying to follow lower chol diet.   S/p covid infection 2021.  Lives with husband o/w health.   Wt Readings from Last 3 Encounters:  03/23/21 225 lb 9.6 oz (102.3 kg)  12/04/20 219 lb (99.3 kg)  11/03/20 226 lb (102.5 kg)   BP Readings from Last 3 Encounters:  03/23/21 126/90  12/04/20 119/87  11/03/20 (!) 141/104         Past Medical History:  Diagnosis Date   Arthritis    fingers   CKD (chronic kidney disease) stage 3, GFR 30-59 ml/min (HCC) 11/30/2017   Depression    Dizziness    in AM, getting out of bed   Dysrhythmia    "skips a beat" sometimes - followed by PCP   Endometrial ca ([place]) 11/30/2017   S/p surgury 1990's   GERD (gastroesophageal reflux disease) 11/30/2017   HLD (hyperlipidemia) 11/30/2017   Hypercholesteremia    Hypertension    Interstitial lung disease ([place])    Neuropathy    bilateral feet   Shortness of breath dyspnea    Sleep apnea    has CPAP, doesn't use   Umbilical hernia    Past Surgical History:  Procedure Laterality Date   ABDOMINAL HYSTERECTOMY     BROW LIFT Bilateral 07/15/2015   Procedure: BLEPHAROPLASTY;  Surgeon: Karle Starch, MD;  Location: [facility];  Service: Ophthalmology;  Laterality: Bilateral;   CHOLECYSTECTOMY     HAMMER TOE SURGERY     HERNIA REPAIR     KNEE ARTHROSCOPY Bilateral    PTOSIS REPAIR Bilateral 07/15/2015   Procedure: PTOSIS REPAIR;   Surgeon: Karle Starch, MD;  Location: [facility];  Service: Ophthalmology;  Laterality: Bilateral;  CPAP   TONSILLECTOMY      reports that she quit smoking about 34 years ago. Her smoking use included cigarettes. She has a 10.00 pack-year smoking history. She has never used smokeless tobacco. She reports that she does not drink alcohol and does not use drugs. family history includes Congestive Heart Failure in her mother; Diabetes in her son; Parkinson's disease in her father; Stroke in her father. Allergies  Allergen Reactions   Lipitor [Atorvastatin] Other (See Comments)    Memory issues   Requip [Ropinirole Hcl] Other (See Comments)    Pt reports feeling generally unwell on this medication   Current Outpatient Medications on File Prior to Visit  Medication Sig Dispense Refill   Cholecalciferol (VITAMIN D-3) 25 MCG (1000 UT) CAPS Take 1 capsule by mouth daily.     cyanocobalamin (,VITAMIN B-12,) 1000 MCG/ML injection INJECT 1 ML IM EVERY 30 DAYS 3 mL 1   meloxicam (MOBIC) 15 MG tablet TAKE 1 TABLET BY MOUTH EVERY DAY 90 tablet 0   Respiratory Therapy Supplies (FLUTTER) DEVI Use as directed 1 each 0  triamcinolone cream (KENALOG) 0.5 % APPLY TO AFFECTED AREA TWICE A DAY 30 g 1   No current facility-administered medications on file prior to visit.        ROS:  All others reviewed and negative.  Objective        PE:  BP 126/90   Pulse (!) 104   Temp 97.6 F (36.4 C) (Oral)   Resp 18   Ht 5' (1.524 m)   Wt 225 lb 9.6 oz (102.3 kg)   SpO2 96%   BMI 44.06 kg/m                 Constitutional: Pt appears in NAD               HENT: Head: NCAT.                Right Ear: External ear normal.                 Left Ear: External ear normal.                Eyes: . Pupils are equal, round, and reactive to light. Conjunctivae and EOM are normal               Nose: without d/c or deformity               Neck: Neck supple. Gross normal ROM               Cardiovascular: Normal  rate and regular rhythm.                 Pulmonary/Chest: Effort normal and breath sounds without rales or wheezing.                Abd:  Soft, NT, ND, + BS, no organomegaly               Neurological: Pt is alert. At baseline orientation, motor grossly intact, has decreased LT to toes bilateral               Skin: Skin is warm. No rashes, no other new lesions, LE edema - none               Psychiatric: Pt behavior is normal without agitation   Micro: none  Cardiac tracings I have personally interpreted today:  none  Pertinent Radiological findings (summarize): none   Lab Results  Component Value Date   WBC 5.8 03/23/2021   HGB 12.9 03/23/2021   HCT 38.4 03/23/2021   PLT 172.0 03/23/2021   GLUCOSE 124 (H) 03/23/2021   CHOL 146 03/23/2021   TRIG 71.0 03/23/2021   HDL 53.80 03/23/2021   LDLCALC 78 03/23/2021   ALT 9 03/23/2021   AST 21 03/23/2021   NA 139 03/23/2021   K 4.2 03/23/2021   CL 102 03/23/2021   CREATININE 1.31 (H) 03/23/2021   BUN 27 (H) 03/23/2021   CO2 29 03/23/2021   TSH 2.18 03/23/2021   HGBA1C 5.5 03/23/2021   Assessment/Plan:  Kathy [name] is a 80 y.o. White or Caucasian [1] female with  has a past medical history of Arthritis, CKD (chronic kidney disease) stage 3, GFR 30-59 ml/min (HCC) (11/30/2017), Depression, Dizziness, Dysrhythmia, Endometrial ca ([place]) (11/30/2017), GERD (gastroesophageal reflux disease) (11/30/2017), HLD (hyperlipidemia) (11/30/2017), Hypercholesteremia, Hypertension, Interstitial lung disease ([place]), Neuropathy, Shortness of breath dyspnea, Sleep apnea, and Umbilical hernia.  Vitamin D deficiency Last vitamin D Lab Results  Component Value Date   VD25OH  24.52 (L) 12/18/2019   Low, to start oral replacement   B12 deficiency Lab Results  Component Value Date   VITAMINB12 419 03/23/2021   Stable, cont oral replacement - b12 1000 mcg qd   ILD (interstitial lung disease) ([place]) Stable, to continue f/u with pulmonary as  planned  Essential hypertension BP Readings from Last 3 Encounters:  03/23/21 126/90  12/04/20 119/87  11/03/20 (!) 141/104   Stable, pt to continue medical treatment cardizem, losartan   HLD (hyperlipidemia) Lab Results  Component Value Date   LDLCALC 78 03/23/2021   Stable, pt to continue current statin fenofibrate   CKD (chronic kidney disease) stage 3, GFR 30-59 ml/min (HCC) Lab Results  Component Value Date   CREATININE 1.31 (H) 03/23/2021   Stable overall, cont to avoid nephrotoxins   Hyperglycemia Lab Results  Component Value Date   HGBA1C 5.5 03/23/2021   Stable, pt to continue current medical treatment  - diet   Aortic atherosclerosis ([place]) Pt to continue low chol diet, exercise, fenofibrate  Peripheral neuropathy Uncontrolled, for elavil qhs prn,  to f/u any worsening symptoms or concerns  Atrial fibrillation (HCC) Rate and volume stable,  to f/u any worsening symptoms or concerns  Followup: No follow-ups on file.  Cathlean Cower, MD 03/24/2021 9:46 PM [facility]

## 2021-03-23 NOTE — Assessment & Plan Note (Signed)
Last vitamin D Lab Results  Component Value Date   VD25OH 24.52 (L) 12/18/2019   Low, to start oral replacement

## 2021-03-24 NOTE — Assessment & Plan Note (Signed)
Stable, to continue f/u with pulmonary as planned

## 2021-03-24 NOTE — Assessment & Plan Note (Signed)
Pt to continue low chol diet, exercise, fenofibrate

## 2021-03-24 NOTE — Assessment & Plan Note (Signed)
Lab Results  Component Value Date   CREATININE 1.31 (H) 03/23/2021   Stable overall, cont to avoid nephrotoxins

## 2021-03-24 NOTE — Assessment & Plan Note (Signed)
Uncontrolled, for elavil qhs prn,  to f/u any worsening symptoms or concerns

## 2021-03-24 NOTE — Assessment & Plan Note (Signed)
BP Readings from Last 3 Encounters:  03/23/21 126/90  12/04/20 119/87  11/03/20 (!) 141/104   Stable, pt to continue medical treatment cardizem, losartan

## 2021-03-24 NOTE — Assessment & Plan Note (Signed)
Lab Results  Component Value Date   LDLCALC 78 03/23/2021   Stable, pt to continue current statin fenofibrate

## 2021-03-24 NOTE — Assessment & Plan Note (Signed)
Rate and volume stable,  to f/u any worsening symptoms or concerns

## 2021-03-24 NOTE — Assessment & Plan Note (Signed)
Lab Results  Component Value Date   HGBA1C 5.5 03/23/2021   Stable, pt to continue current medical treatment  - diet

## 2021-03-24 NOTE — Assessment & Plan Note (Signed)
Lab Results  Component Value Date   VITAMINB12 419 03/23/2021   Stable, cont oral replacement - b12 1000 mcg qd

## 2021-03-25 ENCOUNTER — Telehealth: Payer: Self-pay | Admitting: Internal Medicine

## 2021-03-25 NOTE — Telephone Encounter (Signed)
Type of form received: handicapped placard  Form placed in: Provider mailbox  Additional instructions from the patient: notify by phone when complete   Things to remember: [place] office: If form received in person, remind patient that forms take 7-10 business days CMA should attach charge sheet and put on Supervisor's desk

## 2021-04-09 ENCOUNTER — Telehealth: Payer: Self-pay

## 2021-04-09 NOTE — Telephone Encounter (Signed)
Called and spoke with pt to let her know that the disability parking placard form was completed and ready for pick up.

## 2021-05-14 DIAGNOSIS — Z20828 Contact with and (suspected) exposure to other viral communicable diseases: Secondary | ICD-10-CM | POA: Diagnosis not present

## 2021-05-20 ENCOUNTER — Ambulatory Visit: Payer: 59 | Admitting: Cardiology

## 2021-06-05 ENCOUNTER — Ambulatory Visit: Payer: 59 | Admitting: Cardiology

## 2021-09-22 ENCOUNTER — Ambulatory Visit (INDEPENDENT_AMBULATORY_CARE_PROVIDER_SITE_OTHER): Payer: Medicare Other | Admitting: Internal Medicine

## 2021-09-22 ENCOUNTER — Encounter: Payer: Self-pay | Admitting: Internal Medicine

## 2021-09-22 VITALS — BP 138/80 | HR 77 | Temp 97.7°F | Ht 60.0 in | Wt 226.0 lb

## 2021-09-22 DIAGNOSIS — I10 Essential (primary) hypertension: Secondary | ICD-10-CM | POA: Diagnosis not present

## 2021-09-22 DIAGNOSIS — N1831 Chronic kidney disease, stage 3a: Secondary | ICD-10-CM

## 2021-09-22 DIAGNOSIS — R739 Hyperglycemia, unspecified: Secondary | ICD-10-CM

## 2021-09-22 DIAGNOSIS — E538 Deficiency of other specified B group vitamins: Secondary | ICD-10-CM | POA: Diagnosis not present

## 2021-09-22 DIAGNOSIS — E7800 Pure hypercholesterolemia, unspecified: Secondary | ICD-10-CM | POA: Diagnosis not present

## 2021-09-22 DIAGNOSIS — E559 Vitamin D deficiency, unspecified: Secondary | ICD-10-CM

## 2021-09-22 DIAGNOSIS — G4730 Sleep apnea, unspecified: Secondary | ICD-10-CM

## 2021-09-22 DIAGNOSIS — R350 Frequency of micturition: Secondary | ICD-10-CM

## 2021-09-22 DIAGNOSIS — F32A Depression, unspecified: Secondary | ICD-10-CM | POA: Diagnosis not present

## 2021-09-22 LAB — CBC WITH DIFFERENTIAL/PLATELET
Basophils Absolute: 0.1 10*3/uL (ref 0.0–0.1)
Basophils Relative: 0.8 % (ref 0.0–3.0)
Eosinophils Absolute: 0.2 10*3/uL (ref 0.0–0.7)
Eosinophils Relative: 2.6 % (ref 0.0–5.0)
HCT: 38.1 % (ref 36.0–46.0)
Hemoglobin: 12.8 g/dL (ref 12.0–15.0)
Lymphocytes Relative: 26.3 % (ref 12.0–46.0)
Lymphs Abs: 1.8 10*3/uL (ref 0.7–4.0)
MCHC: 33.6 g/dL (ref 30.0–36.0)
MCV: 97.1 fl (ref 78.0–100.0)
Monocytes Absolute: 0.5 10*3/uL (ref 0.1–1.0)
Monocytes Relative: 7.7 % (ref 3.0–12.0)
Neutro Abs: 4.3 10*3/uL (ref 1.4–7.7)
Neutrophils Relative %: 62.6 % (ref 43.0–77.0)
Platelets: 195 10*3/uL (ref 150.0–400.0)
RBC: 3.92 Mil/uL (ref 3.87–5.11)
RDW: 13 % (ref 11.5–15.5)
WBC: 6.8 10*3/uL (ref 4.0–10.5)

## 2021-09-22 LAB — TSH: TSH: 2.44 u[IU]/mL (ref 0.35–5.50)

## 2021-09-22 LAB — HEPATIC FUNCTION PANEL
ALT: 14 U/L (ref 0–35)
AST: 28 U/L (ref 0–37)
Albumin: 4.2 g/dL (ref 3.5–5.2)
Alkaline Phosphatase: 48 U/L (ref 39–117)
Bilirubin, Direct: 0.2 mg/dL (ref 0.0–0.3)
Total Bilirubin: 0.7 mg/dL (ref 0.2–1.2)
Total Protein: 7.9 g/dL (ref 6.0–8.3)

## 2021-09-22 LAB — URINALYSIS, ROUTINE W REFLEX MICROSCOPIC
Bilirubin Urine: NEGATIVE
Hgb urine dipstick: NEGATIVE
Ketones, ur: NEGATIVE
Nitrite: POSITIVE — AB
Specific Gravity, Urine: 1.01 (ref 1.000–1.030)
Total Protein, Urine: NEGATIVE
Urine Glucose: NEGATIVE
Urobilinogen, UA: 1 (ref 0.0–1.0)
pH: 6.5 (ref 5.0–8.0)

## 2021-09-22 LAB — LIPID PANEL
Cholesterol: 152 mg/dL (ref 0–200)
HDL: 56.3 mg/dL (ref >39.00)
LDL Cholesterol: 77 mg/dL (ref 0–99)
NonHDL: 95.26
Total CHOL/HDL Ratio: 3
Triglycerides: 92 mg/dL (ref 0.0–149.0)
VLDL: 18.4 mg/dL (ref 0.0–40.0)

## 2021-09-22 LAB — VITAMIN B12: Vitamin B-12: 356 pg/mL (ref 211–911)

## 2021-09-22 LAB — VITAMIN D 25 HYDROXY (VIT D DEFICIENCY, FRACTURES): VITD: 42.31 ng/mL (ref 30.00–100.00)

## 2021-09-22 LAB — HEMOGLOBIN A1C: Hgb A1c MFr Bld: 5.8 % (ref 4.6–6.5)

## 2021-09-22 MED ORDER — CIPROFLOXACIN HCL 500 MG PO TABS: 500.0000 mg | ORAL_TABLET | Freq: Two times a day (BID) | ORAL | 0 refills | Status: AC

## 2021-09-22 NOTE — Patient Instructions (Signed)
Please take all new medication as prescribed - the antibiotic ? ?Please call if you change your mind about starting Celexa for depression ? ?Please continue all other medications as before, and refills have been done if requested. ? ?Please have the pharmacy call with any other refills you may need. ? ?Please continue your efforts at being more active, low cholesterol diet, and weight control. ? ?Please keep your appointments with your specialists as you may have planned ? ?Please go to the LAB at the blood drawing area for the tests to be done ? ?You will be contacted by phone if any changes need to be made immediately.  Otherwise, you will receive a letter about your results with an explanation, but please check with MyChart first. ? ?Please remember to sign up for MyChart if you have not done so, as this will be important to you in the future with finding out test results, communicating by private email, and scheduling acute appointments online when needed. ? ?Please make an Appointment to return in 6 months, or sooner if needed ?

## 2021-09-22 NOTE — Assessment & Plan Note (Signed)
Mild uncontrolled, declines celexa trial for now or referral counseling ?

## 2021-09-22 NOTE — Assessment & Plan Note (Signed)
Lab Results  Component Value Date   VITAMINB12 356 09/22/2021   Stable, cont oral replacement - b12 1000 mcg qd 

## 2021-09-22 NOTE — Assessment & Plan Note (Signed)
Lab Results  ?Component Value Date  ? [place] 77 09/22/2021  ? ?Mild uncontrolled, goal ldl < 70, pt to continue current statin fenofibrate as declines change for now ? ?

## 2021-09-22 NOTE — Assessment & Plan Note (Signed)
Lab Results  ?Component Value Date  ? CREATININE 1.31 (H) 03/23/2021  ? ?Stable overall, cont to avoid nephrotoxins ? ?

## 2021-09-22 NOTE — Assessment & Plan Note (Signed)
Has ongoing severe symptoms, declines tx for now ?

## 2021-09-22 NOTE — Assessment & Plan Note (Signed)
Lab Results  ?Component Value Date  ? HGBA1C 5.8 09/22/2021  ? ?Stable, pt to continue current medical treatment  - diet ? ?

## 2021-09-22 NOTE — Assessment & Plan Note (Signed)
BP Readings from Last 3 Encounters:  ?09/22/21 138/80  ?03/23/21 126/90  ?12/04/20 119/87  ? ?Stable, pt to continue medical treatment losartan ? ?

## 2021-09-22 NOTE — Assessment & Plan Note (Signed)
Last vitamin D Lab Results  Component Value Date   VD25OH 42.31 09/22/2021   Stable, cont oral replacement  

## 2021-09-22 NOTE — Assessment & Plan Note (Signed)
High suspicion uti - for cipro and urine cx ?
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
## 2021-09-23 LAB — URINE CULTURE

## 2021-09-28 ENCOUNTER — Other Ambulatory Visit: Payer: Self-pay | Admitting: Internal Medicine

## 2021-10-15 DIAGNOSIS — Z20822 Contact with and (suspected) exposure to covid-19: Secondary | ICD-10-CM | POA: Diagnosis not present

## 2021-11-02 DIAGNOSIS — Z20822 Contact with and (suspected) exposure to covid-19: Secondary | ICD-10-CM | POA: Diagnosis not present

## 2021-11-05 ENCOUNTER — Other Ambulatory Visit: Payer: Self-pay | Admitting: Internal Medicine

## 2021-11-09 ENCOUNTER — Other Ambulatory Visit: Payer: Self-pay | Admitting: Internal Medicine

## 2021-11-10 MED ORDER — CYANOCOBALAMIN 1000 MCG/ML IJ SOLN: INTRAMUSCULAR | 1 refills | Status: DC

## 2021-11-10 NOTE — Addendum Note (Signed)
Addended by: Biagio Borg on: 11/10/2021 08:51 AM ? ? Modules accepted: Orders ? ?

## 2021-11-11 DIAGNOSIS — H2511 Age-related nuclear cataract, right eye: Secondary | ICD-10-CM | POA: Diagnosis not present

## 2021-12-01 ENCOUNTER — Inpatient Hospital Stay (HOSPITAL_COMMUNITY)
Admission: EM | Admit: 2021-12-01 | Discharge: 2021-12-04 | DRG: 682 | Disposition: A | Discharge status: Home Health | Payer: Medicare Other | Attending: Family Medicine | Admitting: Family Medicine

## 2021-12-01 ENCOUNTER — Encounter (HOSPITAL_COMMUNITY): Payer: Self-pay | Admitting: *Deleted

## 2021-12-01 ENCOUNTER — Emergency Department (HOSPITAL_COMMUNITY): Payer: Medicare Other

## 2021-12-01 ENCOUNTER — Other Ambulatory Visit: Payer: Self-pay

## 2021-12-01 DIAGNOSIS — I4821 Permanent atrial fibrillation: Secondary | ICD-10-CM | POA: Diagnosis present

## 2021-12-01 DIAGNOSIS — K219 Gastro-esophageal reflux disease without esophagitis: Secondary | ICD-10-CM | POA: Diagnosis present

## 2021-12-01 DIAGNOSIS — R0689 Other abnormalities of breathing: Secondary | ICD-10-CM | POA: Diagnosis not present

## 2021-12-01 DIAGNOSIS — I5022 Chronic systolic (congestive) heart failure: Secondary | ICD-10-CM | POA: Diagnosis present

## 2021-12-01 DIAGNOSIS — G4733 Obstructive sleep apnea (adult) (pediatric): Secondary | ICD-10-CM | POA: Diagnosis present

## 2021-12-01 DIAGNOSIS — J189 Pneumonia, unspecified organism: Secondary | ICD-10-CM | POA: Diagnosis present

## 2021-12-01 DIAGNOSIS — Z8616 Personal history of COVID-19: Secondary | ICD-10-CM | POA: Diagnosis not present

## 2021-12-01 DIAGNOSIS — E860 Dehydration: Secondary | ICD-10-CM | POA: Diagnosis present

## 2021-12-01 DIAGNOSIS — Z6841 Body Mass Index (BMI) 40.0 and over, adult: Secondary | ICD-10-CM | POA: Diagnosis not present

## 2021-12-01 DIAGNOSIS — Z87891 Personal history of nicotine dependence: Secondary | ICD-10-CM | POA: Diagnosis not present

## 2021-12-01 DIAGNOSIS — R0902 Hypoxemia: Secondary | ICD-10-CM | POA: Diagnosis present

## 2021-12-01 DIAGNOSIS — G9341 Metabolic encephalopathy: Secondary | ICD-10-CM | POA: Diagnosis present

## 2021-12-01 DIAGNOSIS — R3589 Other polyuria: Secondary | ICD-10-CM | POA: Insufficient documentation

## 2021-12-01 DIAGNOSIS — R7303 Prediabetes: Secondary | ICD-10-CM | POA: Diagnosis present

## 2021-12-01 DIAGNOSIS — Z888 Allergy status to other drugs, medicaments and biological substances status: Secondary | ICD-10-CM

## 2021-12-01 DIAGNOSIS — N3000 Acute cystitis without hematuria: Secondary | ICD-10-CM | POA: Diagnosis not present

## 2021-12-01 DIAGNOSIS — Z7901 Long term (current) use of anticoagulants: Secondary | ICD-10-CM

## 2021-12-01 DIAGNOSIS — J849 Interstitial pulmonary disease, unspecified: Secondary | ICD-10-CM | POA: Diagnosis present

## 2021-12-01 DIAGNOSIS — I10 Essential (primary) hypertension: Secondary | ICD-10-CM | POA: Diagnosis not present

## 2021-12-01 DIAGNOSIS — I959 Hypotension, unspecified: Secondary | ICD-10-CM | POA: Diagnosis not present

## 2021-12-01 DIAGNOSIS — Z8542 Personal history of malignant neoplasm of other parts of uterus: Secondary | ICD-10-CM | POA: Diagnosis not present

## 2021-12-01 DIAGNOSIS — R4182 Altered mental status, unspecified: Secondary | ICD-10-CM | POA: Diagnosis not present

## 2021-12-01 DIAGNOSIS — G629 Polyneuropathy, unspecified: Secondary | ICD-10-CM | POA: Diagnosis present

## 2021-12-01 DIAGNOSIS — E861 Hypovolemia: Secondary | ICD-10-CM | POA: Diagnosis present

## 2021-12-01 DIAGNOSIS — Z8249 Family history of ischemic heart disease and other diseases of the circulatory system: Secondary | ICD-10-CM

## 2021-12-01 DIAGNOSIS — F32A Depression, unspecified: Secondary | ICD-10-CM | POA: Diagnosis present

## 2021-12-01 DIAGNOSIS — R739 Hyperglycemia, unspecified: Secondary | ICD-10-CM | POA: Diagnosis present

## 2021-12-01 DIAGNOSIS — N179 Acute kidney failure, unspecified: Principal | ICD-10-CM | POA: Diagnosis present

## 2021-12-01 DIAGNOSIS — N182 Chronic kidney disease, stage 2 (mild): Secondary | ICD-10-CM | POA: Diagnosis present

## 2021-12-01 DIAGNOSIS — R000 Tachycardia, unspecified: Secondary | ICD-10-CM | POA: Diagnosis not present

## 2021-12-01 DIAGNOSIS — Z9049 Acquired absence of other specified parts of digestive tract: Secondary | ICD-10-CM | POA: Insufficient documentation

## 2021-12-01 DIAGNOSIS — Z79899 Other long term (current) drug therapy: Secondary | ICD-10-CM

## 2021-12-01 DIAGNOSIS — R509 Fever, unspecified: Secondary | ICD-10-CM | POA: Diagnosis not present

## 2021-12-01 DIAGNOSIS — E7800 Pure hypercholesterolemia, unspecified: Secondary | ICD-10-CM | POA: Diagnosis present

## 2021-12-01 DIAGNOSIS — N183 Chronic kidney disease, stage 3 unspecified: Secondary | ICD-10-CM | POA: Diagnosis present

## 2021-12-01 DIAGNOSIS — I130 Hypertensive heart and chronic kidney disease with heart failure and stage 1 through stage 4 chronic kidney disease, or unspecified chronic kidney disease: Secondary | ICD-10-CM | POA: Diagnosis present

## 2021-12-01 DIAGNOSIS — Z9981 Dependence on supplemental oxygen: Secondary | ICD-10-CM | POA: Diagnosis not present

## 2021-12-01 LAB — CBC WITH DIFFERENTIAL/PLATELET
Abs Immature Granulocytes: 0.01 10*3/uL (ref 0.00–0.07)
Basophils Absolute: 0 10*3/uL (ref 0.0–0.1)
Basophils Relative: 1 %
Eosinophils Absolute: 0.1 10*3/uL (ref 0.0–0.5)
Eosinophils Relative: 2 %
HCT: 36 % (ref 36.0–46.0)
Hemoglobin: 11.7 g/dL — ABNORMAL LOW (ref 12.0–15.0)
Immature Granulocytes: 0 %
Lymphocytes Relative: 31 %
Lymphs Abs: 1.6 10*3/uL (ref 0.7–4.0)
MCH: 32.4 pg (ref 26.0–34.0)
MCHC: 32.5 g/dL (ref 30.0–36.0)
MCV: 99.7 fL (ref 80.0–100.0)
Monocytes Absolute: 0.4 10*3/uL (ref 0.1–1.0)
Monocytes Relative: 7 %
Neutro Abs: 3.1 10*3/uL (ref 1.7–7.7)
Neutrophils Relative %: 59 %
Platelets: 166 10*3/uL (ref 150–400)
RBC: 3.61 MIL/uL — ABNORMAL LOW (ref 3.87–5.11)
RDW: 12.5 % (ref 11.5–15.5)
WBC: 5.1 10*3/uL (ref 4.0–10.5)
nRBC: 0 % (ref 0.0–0.2)

## 2021-12-01 LAB — URINALYSIS, ROUTINE W REFLEX MICROSCOPIC
Bilirubin Urine: NEGATIVE
Glucose, UA: NEGATIVE mg/dL
Ketones, ur: NEGATIVE mg/dL
Nitrite: NEGATIVE
Protein, ur: NEGATIVE mg/dL
Specific Gravity, Urine: 1.005 (ref 1.005–1.030)
pH: 8 (ref 5.0–8.0)

## 2021-12-01 LAB — COMPREHENSIVE METABOLIC PANEL
ALT: 11 U/L (ref 0–44)
AST: 25 U/L (ref 15–41)
Albumin: 3.4 g/dL — ABNORMAL LOW (ref 3.5–5.0)
Alkaline Phosphatase: 43 U/L (ref 38–126)
Anion gap: 10 (ref 5–15)
BUN: 19 mg/dL (ref 8–23)
CO2: 27 mmol/L (ref 22–32)
Calcium: 8.9 mg/dL (ref 8.9–10.3)
Chloride: 102 mmol/L (ref 98–111)
Creatinine, Ser: 1.37 mg/dL — ABNORMAL HIGH (ref 0.44–1.00)
GFR, Estimated: 39 mL/min — ABNORMAL LOW (ref >60)
Glucose, Bld: 107 mg/dL — ABNORMAL HIGH (ref 70–99)
Potassium: 4.2 mmol/L (ref 3.5–5.1)
Sodium: 139 mmol/L (ref 135–145)
Total Bilirubin: 0.6 mg/dL (ref 0.3–1.2)
Total Protein: 6.8 g/dL (ref 6.5–8.1)

## 2021-12-01 LAB — LIPASE, BLOOD: Lipase: 27 U/L (ref 11–51)

## 2021-12-01 MED ORDER — SODIUM CHLORIDE 0.9 % IV SOLN: INTRAVENOUS | Status: DC

## 2021-12-01 MED ORDER — SODIUM CHLORIDE 0.9 % IV SOLN
1.0000 g | Freq: Once | INTRAVENOUS | Status: AC
  Administered 2021-12-02: 1 g via INTRAVENOUS
  Filled 2021-12-01: qty 10

## 2021-12-01 MED ORDER — SODIUM CHLORIDE 0.9 % IV BOLUS
500.0000 mL | Freq: Once | INTRAVENOUS | Status: AC
  Administered 2021-12-01: 500 mL via INTRAVENOUS

## 2021-12-01 NOTE — ED Triage Notes (Signed)
Pt from home with EMS for AMS. Per family, pt woke up from nap around 1930 with AMS. Initial call was for stroke like symptoms, EMS reported generalized weakness. Pt is chronically on home oxygen, 2L. Pt denies any complaints, slow to answer orientation questions (A&ox3), lips pale and cracked.

## 2021-12-02 ENCOUNTER — Encounter (HOSPITAL_COMMUNITY): Payer: Self-pay | Admitting: Family Medicine

## 2021-12-02 DIAGNOSIS — E861 Hypovolemia: Secondary | ICD-10-CM | POA: Diagnosis present

## 2021-12-02 DIAGNOSIS — I4821 Permanent atrial fibrillation: Secondary | ICD-10-CM | POA: Diagnosis present

## 2021-12-02 DIAGNOSIS — I130 Hypertensive heart and chronic kidney disease with heart failure and stage 1 through stage 4 chronic kidney disease, or unspecified chronic kidney disease: Secondary | ICD-10-CM | POA: Diagnosis present

## 2021-12-02 DIAGNOSIS — E7800 Pure hypercholesterolemia, unspecified: Secondary | ICD-10-CM | POA: Diagnosis present

## 2021-12-02 DIAGNOSIS — Z9981 Dependence on supplemental oxygen: Secondary | ICD-10-CM | POA: Diagnosis not present

## 2021-12-02 DIAGNOSIS — G9341 Metabolic encephalopathy: Secondary | ICD-10-CM | POA: Diagnosis present

## 2021-12-02 DIAGNOSIS — Z87891 Personal history of nicotine dependence: Secondary | ICD-10-CM | POA: Diagnosis not present

## 2021-12-02 DIAGNOSIS — K219 Gastro-esophageal reflux disease without esophagitis: Secondary | ICD-10-CM | POA: Diagnosis present

## 2021-12-02 DIAGNOSIS — N3000 Acute cystitis without hematuria: Secondary | ICD-10-CM | POA: Diagnosis present

## 2021-12-02 DIAGNOSIS — F32A Depression, unspecified: Secondary | ICD-10-CM | POA: Diagnosis present

## 2021-12-02 DIAGNOSIS — I5022 Chronic systolic (congestive) heart failure: Secondary | ICD-10-CM | POA: Diagnosis present

## 2021-12-02 DIAGNOSIS — R4182 Altered mental status, unspecified: Secondary | ICD-10-CM | POA: Diagnosis not present

## 2021-12-02 DIAGNOSIS — G4733 Obstructive sleep apnea (adult) (pediatric): Secondary | ICD-10-CM | POA: Diagnosis present

## 2021-12-02 DIAGNOSIS — R739 Hyperglycemia, unspecified: Secondary | ICD-10-CM | POA: Diagnosis present

## 2021-12-02 DIAGNOSIS — N179 Acute kidney failure, unspecified: Secondary | ICD-10-CM | POA: Diagnosis present

## 2021-12-02 DIAGNOSIS — Z8542 Personal history of malignant neoplasm of other parts of uterus: Secondary | ICD-10-CM | POA: Diagnosis not present

## 2021-12-02 DIAGNOSIS — N182 Chronic kidney disease, stage 2 (mild): Secondary | ICD-10-CM | POA: Diagnosis present

## 2021-12-02 DIAGNOSIS — E860 Dehydration: Secondary | ICD-10-CM | POA: Diagnosis present

## 2021-12-02 DIAGNOSIS — R0902 Hypoxemia: Secondary | ICD-10-CM | POA: Diagnosis present

## 2021-12-02 DIAGNOSIS — Z6841 Body Mass Index (BMI) 40.0 and over, adult: Secondary | ICD-10-CM | POA: Diagnosis not present

## 2021-12-02 DIAGNOSIS — R7303 Prediabetes: Secondary | ICD-10-CM | POA: Diagnosis present

## 2021-12-02 DIAGNOSIS — Z8616 Personal history of COVID-19: Secondary | ICD-10-CM | POA: Diagnosis not present

## 2021-12-02 DIAGNOSIS — J849 Interstitial pulmonary disease, unspecified: Secondary | ICD-10-CM | POA: Diagnosis present

## 2021-12-02 DIAGNOSIS — G629 Polyneuropathy, unspecified: Secondary | ICD-10-CM | POA: Diagnosis present

## 2021-12-02 LAB — COMPREHENSIVE METABOLIC PANEL
ALT: 11 U/L (ref 0–44)
AST: 21 U/L (ref 15–41)
Albumin: 3.1 g/dL — ABNORMAL LOW (ref 3.5–5.0)
Alkaline Phosphatase: 40 U/L (ref 38–126)
Anion gap: 10 (ref 5–15)
BUN: 17 mg/dL (ref 8–23)
CO2: 28 mmol/L (ref 22–32)
Calcium: 8.8 mg/dL — ABNORMAL LOW (ref 8.9–10.3)
Chloride: 101 mmol/L (ref 98–111)
Creatinine, Ser: 1.23 mg/dL — ABNORMAL HIGH (ref 0.44–1.00)
GFR, Estimated: 44 mL/min — ABNORMAL LOW (ref >60)
Glucose, Bld: 89 mg/dL (ref 70–99)
Potassium: 3.8 mmol/L (ref 3.5–5.1)
Sodium: 139 mmol/L (ref 135–145)
Total Bilirubin: 0.7 mg/dL (ref 0.3–1.2)
Total Protein: 6.3 g/dL — ABNORMAL LOW (ref 6.5–8.1)

## 2021-12-02 LAB — CBC WITH DIFFERENTIAL/PLATELET
Abs Immature Granulocytes: 0.02 10*3/uL (ref 0.00–0.07)
Basophils Absolute: 0 10*3/uL (ref 0.0–0.1)
Basophils Relative: 0 %
Eosinophils Absolute: 0.1 10*3/uL (ref 0.0–0.5)
Eosinophils Relative: 2 %
HCT: 37.1 % (ref 36.0–46.0)
Hemoglobin: 12.1 g/dL (ref 12.0–15.0)
Immature Granulocytes: 0 %
Lymphocytes Relative: 34 %
Lymphs Abs: 1.8 10*3/uL (ref 0.7–4.0)
MCH: 32.2 pg (ref 26.0–34.0)
MCHC: 32.6 g/dL (ref 30.0–36.0)
MCV: 98.7 fL (ref 80.0–100.0)
Monocytes Absolute: 0.5 10*3/uL (ref 0.1–1.0)
Monocytes Relative: 9 %
Neutro Abs: 2.9 10*3/uL (ref 1.7–7.7)
Neutrophils Relative %: 55 %
Platelets: 149 10*3/uL — ABNORMAL LOW (ref 150–400)
RBC: 3.76 MIL/uL — ABNORMAL LOW (ref 3.87–5.11)
RDW: 12.4 % (ref 11.5–15.5)
WBC: 5.2 10*3/uL (ref 4.0–10.5)
nRBC: 0 % (ref 0.0–0.2)

## 2021-12-02 LAB — MAGNESIUM: Magnesium: 2.2 mg/dL (ref 1.7–2.4)

## 2021-12-02 LAB — PHOSPHORUS: Phosphorus: 4.1 mg/dL (ref 2.5–4.6)

## 2021-12-02 MED ORDER — MELATONIN 5 MG PO TABS: 5.0000 mg | ORAL_TABLET | Freq: Every evening | ORAL | Status: DC | PRN

## 2021-12-02 MED ORDER — PROCHLORPERAZINE EDISYLATE 10 MG/2ML IJ SOLN: 10.0000 mg | Freq: Four times a day (QID) | INTRAMUSCULAR | Status: DC | PRN

## 2021-12-02 MED ORDER — SODIUM CHLORIDE 0.9 % IV SOLN: INTRAVENOUS | Status: DC

## 2021-12-02 MED ORDER — SODIUM CHLORIDE 0.9 % IV SOLN
2.0000 g | INTRAVENOUS | Status: DC
  Administered 2021-12-02 – 2021-12-03 (×2): 2 g via INTRAVENOUS
  Filled 2021-12-02 (×2): qty 20

## 2021-12-02 MED ORDER — METOPROLOL SUCCINATE ER 25 MG PO TB24
12.5000 mg | ORAL_TABLET | Freq: Every day | ORAL | Status: DC
Start: 2021-12-02 — End: 2021-12-03
  Administered 2021-12-02: 12.5 mg via ORAL
  Filled 2021-12-02: qty 1

## 2021-12-02 MED ORDER — PANTOPRAZOLE SODIUM 40 MG PO TBEC
40.0000 mg | DELAYED_RELEASE_TABLET | Freq: Every day | ORAL | Status: DC
  Administered 2021-12-02 – 2021-12-04 (×3): 40 mg via ORAL
  Filled 2021-12-02 (×3): qty 1

## 2021-12-02 MED ORDER — FENOFIBRATE 54 MG PO TABS
54.0000 mg | ORAL_TABLET | Freq: Every day | ORAL | Status: DC
  Administered 2021-12-02 – 2021-12-04 (×3): 54 mg via ORAL
  Filled 2021-12-02 (×3): qty 1

## 2021-12-02 MED ORDER — ACETAMINOPHEN 325 MG PO TABS: 650.0000 mg | ORAL_TABLET | Freq: Four times a day (QID) | ORAL | Status: DC | PRN

## 2021-12-02 MED ORDER — APIXABAN 5 MG PO TABS
5.0000 mg | ORAL_TABLET | Freq: Two times a day (BID) | ORAL | Status: DC
  Administered 2021-12-02 – 2021-12-04 (×5): 5 mg via ORAL
  Filled 2021-12-02 (×5): qty 1

## 2021-12-02 NOTE — Progress Notes (Signed)
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 Vaughan Browner    Awoke from nap 12/01/2021 with altered mental status strokelike symptoms EMS reported generalized weakness slow to answer questions   Mucosa found to be very dry-she found to have more increasing weakness with her ambulation with walker   BUNs/creatinine on arrival 19/1.3 close to baseline-->17/1.2 WBC 5.1 hemoglobin 11.7 platelets 166 CT head no acute findings, CXR portable chronic changes without acute abnormality   Urine analysis large leukocytes negative nitrates rare bacteria small hemoglobin-admitted for presumed UTI   S:-Patient categorically tells me that she is not confused and that her mucosa was dry and that she was not able to talk properly She complains of some abdominal pain on the left side secondary to a hernia that she has had for a while She has no chest pain She is very coherent and interactive with her husband of 61 years at the bedside She does not have any fever or chills she has no burning in her urine She has no cough  O/e BP 120/69   Pulse 89   Temp (!) 97.5 F (36.4 C) (Oral)   Resp 17   Wt 102.5 kg   SpO2 93%   BMI 44.14 kg/m  Awake coherent pleasant no distress moving 4 limbs equally although somewhat weak in lower extremities given prior knee surgeries and habitus Chest is clinically clear no rales rhonchi She is wearing oxygen S1-S2 no murmur   A?  Possible UTI  We will monitor the patient overnight-we will get her up in about-if she has no gross  preliminarily on her urine culture and looks stable we can probably discharge her in the morning back home  Verneita Griffes, MD [facility] 11:43 AM

## 2021-12-02 NOTE — ED Provider Notes (Signed)
[facility] EMERGENCY DEPARTMENT Provider Note   CSN: [id number] Arrival date & time: 12/01/21  2051     History  Chief Complaint  Patient presents with   Altered Mental Status    Kathy [name] is a 81 y.o. female.  Patient brought in by EMS.  Most of the information from the husband EMS reported that around 1930 patient seem to have altered mental status initial call was for strokelike symptoms.  EMS reported generalized weakness patient is chronically on home oxygen 2 L patient denied any complaints.  Slower to answer questions lips were pale and mouth was dry.  Patient now states that she has some trouble verbalizing because her mouth was very dry.  Patient's husband stated that today a couple times he noted that she seemed to have increased weakness with walking with her walker and can get stuck but he thought maybe she had some speech problems and maybe there was some confusion.  Patient's temp here 97.3 heart rate 99 respirations 20 blood pressure 140/86.  Oxygen sats on her 2 L of oxygen 100%.    Past medical history is significant for hypertension sleep apnea high cholesterol gastroesophageal reflux disease.  Endometrial cancer status post surgery in the 1990s.  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  Past surgical history significant for cholecystectomy abdominal hysterectomy.  Patient former smoker quit in 1988.      Home Medications Prior to Admission medications   Medication Sig Start Date End Date Taking? Authorizing Provider  albuterol (VENTOLIN HFA) 108 (90 Base) MCG/ACT inhaler Inhale 1-2 puffs into the lungs every 6 (six) hours as needed for wheezing or shortness of breath. 03/23/21   Biagio Borg, MD  amitriptyline (ELAVIL) 100 MG tablet TAKE 1/2 - 1 TABLET BY MOUTH AT BEDTIME FOR SLEEP AND FEET PAIN 09/28/21   Biagio Borg, MD  apixaban (ELIQUIS) 5 MG TABS tablet Take 1 tablet (5 mg total) by mouth 2 (two) times daily.  03/23/21   Biagio Borg, MD  Cholecalciferol (VITAMIN D-3) 25 MCG (1000 UT) CAPS Take 1 capsule by mouth daily.    [provider]  cyanocobalamin (,VITAMIN B-12,) 1000 MCG/ML injection INJECT 1 ML IM EVERY 30 DAYS 11/10/21   Biagio Borg, MD  diltiazem (CARDIZEM CD) 120 MG 24 hr capsule Take 1 capsule (120 mg total) by mouth daily. 03/23/21 06/21/21  Biagio Borg, MD  fenofibrate micronized (LOFIBRA) 134 MG capsule TAKE 1 CAPSULE BY MOUTH EVERY DAY IN THE MORNING BEFORE BREAKFAST 03/23/21   Biagio Borg, MD  furosemide (LASIX) 40 MG tablet 1 tab by mouth in the AM, and 1 tab by mouth in the PM as needed for persistent swelling or weight gain more than 3-5 lbs 03/23/21   Biagio Borg, MD  losartan (COZAAR) 100 MG tablet TAKE 1/2 (ONE HALF) TABLET BY MOUTH ONCE DAILY 03/23/21   Biagio Borg, MD  meloxicam (MOBIC) 15 MG tablet TAKE 1 TABLET BY MOUTH EVERY DAY 12/30/20   Biagio Borg, MD  metoprolol succinate (TOPROL-XL) 50 MG 24 hr tablet Take 1 tablet (50 mg total) by mouth daily. Take with or immediately following a meal. 03/23/21   Biagio Borg, MD  pantoprazole (PROTONIX) 40 MG tablet Take 1 tablet (40 mg total) by mouth daily. 03/23/21   Biagio Borg, MD  Respiratory Therapy Supplies (FLUTTER) DEVI Use as directed 02/13/19   Lauraine Rinne, NP  triamcinolone cream (KENALOG) 0.5 %  APPLY TO AFFECTED AREA TWICE A DAY 12/30/20   Biagio Borg, MD      Allergies    Lipitor [atorvastatin] and Requip [ropinirole hcl]    Review of Systems   Review of Systems  Constitutional:  Negative for chills and fever.  HENT:  Negative for ear pain and sore throat.   Eyes:  Negative for pain and visual disturbance.  Respiratory:  Negative for cough and shortness of breath.   Cardiovascular:  Negative for chest pain and palpitations.  Gastrointestinal:  Negative for abdominal pain and vomiting.  Genitourinary:  Negative for dysuria and hematuria.  Musculoskeletal:  Negative for arthralgias and back pain.   Skin:  Negative for color change and rash.  Neurological:  Positive for weakness. Negative for seizures and syncope.  Psychiatric/Behavioral:  Positive for confusion.   All other systems reviewed and are negative.  Physical Exam Updated Vital Signs BP (!) 141/101 (BP Location: Right Arm)   Pulse 95   Temp (!) 97.3 F (36.3 C) (Oral)   Resp 18   SpO2 100%  Physical Exam Vitals and nursing note reviewed.  Constitutional:      General: She is not in acute distress.    Appearance: Normal appearance. She is well-developed.  HENT:     Head: Normocephalic and atraumatic.     Mouth/Throat:     Mouth: Mucous membranes are dry.  Eyes:     Extraocular Movements: Extraocular movements intact.     Conjunctiva/sclera: Conjunctivae normal.     Pupils: Pupils are equal, round, and reactive to light.  Cardiovascular:     Rate and Rhythm: Normal rate and regular rhythm.     Heart sounds: No murmur heard. Pulmonary:     Effort: Pulmonary effort is normal. No respiratory distress.     Breath sounds: Normal breath sounds.  Abdominal:     Palpations: Abdomen is soft.     Tenderness: There is no abdominal tenderness.  Musculoskeletal:        General: No swelling.     Cervical back: Neck supple.  Skin:    General: Skin is warm and dry.     Capillary Refill: Capillary refill takes less than 2 seconds.  Neurological:     General: No focal deficit present.     Mental Status: She is alert and oriented to person, place, and time.     Cranial Nerves: No cranial nerve deficit.     Sensory: No sensory deficit.     Motor: No weakness.     Comments: Patient now alert and oriented.  Psychiatric:        Mood and Affect: Mood normal.    ED Results / Procedures / Treatments   Labs (all labs ordered are listed, but only abnormal results are displayed) Labs Reviewed  CBC WITH DIFFERENTIAL/PLATELET - Abnormal; Notable for the following components:      Result Value   RBC 3.61 (*)    Hemoglobin  11.7 (*)    All other components within normal limits  COMPREHENSIVE METABOLIC PANEL - Abnormal; Notable for the following components:   Glucose, Bld 107 (*)    Creatinine, Ser 1.37 (*)    Albumin 3.4 (*)    GFR, Estimated 39 (*)    All other components within normal limits  URINALYSIS, ROUTINE W REFLEX MICROSCOPIC - Abnormal; Notable for the following components:   Color, Urine STRAW (*)    APPearance HAZY (*)    Hgb urine dipstick SMALL (*)  Leukocytes,Ua LARGE (*)    Bacteria, UA RARE (*)    All other components within normal limits  URINE CULTURE  LIPASE, BLOOD    EKG EKG Interpretation  Date/Time:  Tuesday December 01 2021 21:06:05 EDT Ventricular Rate:  101 PR Interval:    QRS Duration: 86 QT Interval:  368 QTC Calculation: 477 R Axis:   36 Text Interpretation: Atrial fibrillation Low voltage, precordial leads Abnormal R-wave progression, early transition Borderline T abnormalities, diffuse leads Confirmed by Fredia Sorrow [phone]) on 12/01/2021 9:31:59 PM  Radiology CT Head Wo Contrast  Result Date: 12/01/2021 CLINICAL DATA:  Mental status change EXAM: CT HEAD WITHOUT CONTRAST TECHNIQUE: Contiguous axial images were obtained from the base of the skull through the vertex without intravenous contrast. RADIATION DOSE REDUCTION: This exam was performed according to the departmental dose-optimization program which includes automated exposure control, adjustment of the mA and/or kV according to patient size and/or use of iterative reconstruction technique. COMPARISON:  None Available. FINDINGS: Brain: No evidence of acute infarction, hemorrhage, hydrocephalus, extra-axial collection or mass lesion/mass effect. Vascular: No hyperdense vessels.  Carotid vascular calcification Skull: Normal. Negative for fracture or focal lesion. Sinuses/Orbits: No acute finding. Other: None IMPRESSION: Negative non contrasted CT appearance of the brain for age Electronically Signed   By: Donavan Foil  M.D.   On: 12/01/2021 23:18   DG Chest Port 1 View  Result Date: 12/01/2021 CLINICAL DATA:  Altered mental status EXAM: PORTABLE CHEST 1 VIEW COMPARISON:  07/08/2019 FINDINGS: Cardiac shadow is stable in appearance. Elevation of the hemidiaphragm on the right is noted increased when compared with the prior exam. Patchy interstitial changes are noted bilaterally and stable likely related to scarring. No bony abnormality is seen. IMPRESSION: Chronic changes without acute abnormality. Electronically Signed   By: Inez Catalina M.D.   On: 12/01/2021 22:13    Procedures Procedures    Medications Ordered in ED Medications  0.9 %  sodium chloride infusion ( Intravenous New Bag/Given 12/01/21 2211)  cefTRIAXone (ROCEPHIN) 1 g in sodium chloride 0.9 % 100 mL IVPB (has no administration in time range)  0.9 %  sodium chloride infusion (has no administration in time range)  sodium chloride 0.9 % bolus 500 mL (500 mLs Intravenous New Bag/Given 12/01/21 2211)    ED Course/ Medical Decision Making/ A&P                           Medical Decision Making Amount and/or Complexity of Data Reviewed Labs: ordered. Radiology: ordered.  Risk Prescription drug management.  Patient now much more alert feels better after the IV bolus.  And will start IV maintenance fluids.  Discussion with her husband and seemed to be applied she was walking with her walker and she kind just sort of Berline Lopes doubt and just sort of stood there.  Myrle Sheng was concerned that she was not communicating well.  Patient now states that because her mouth was so dry and now feels better and she is able to talk better.  CBC white count 5.1 hemoglobin 32.4 complete metabolic panel without any significant abnormalities GFR is 39 but she is known to have chronic kidney disease stage III.  So consistent with that.  Lipase normal.  Anion gap normal.  Urinalysis leukocytes large RBC 6-10 white blood cells 21-50 some bacteria.  So some question of urinary  tract infection.  Patient states that she has had urinary frequency.  CT head without any acute findings  and chest x-ray without any acute findings.  Just the chronic changes seen before.  Still on this urinalysis sent for culture.  Patient started on Rocephin.  We will get maintenance IV fluids.  I think based on the events of today we will get hospitalist to admit her.  Dr. Cathlean Cower is her primary care doctor.  And make sure that her strength comes back and that she continues to do well.  Most likely would be able to be discharged tomorrow.  Clinically I do not think that there is been an acute stroke.  Head CT negative neuro exam completely normal now.  No known neurodeficit.  Consideration could be given during the admission if new symptoms develop for MRI.  I feel that most likely this is something to do with probable urinary tract infection  Final Clinical Impression(s) / ED Diagnoses Final diagnoses:  Altered mental status, unspecified altered mental status type  Acute cystitis without hematuria    Rx / DC Orders ED Discharge Orders     None         Fredia Sorrow, MD 12/02/21 0028

## 2021-12-02 NOTE — H&P (Addendum)
History and Physical  Kathy [name] TWS:[id number] DOB: 1941/06/02 DOA: 12/01/2021  Referring physician: Dr. Rogene Houston, [employer]  PCP: Kathy Borg, MD  Outpatient Specialists: Cardiology. Patient coming from: Home via EMS  Chief Complaint: Generalized weakness.  HPI: Kathy [name] is a 81 y.o. female with medical history significant for severe morbid obesity, hyperlipidemia, hypertension, prediabetes, chronic depression, GERD, chronic hypoxia on 2 L nasal cannula continuously, presented from home to [facility] ED via EMS due to generalized weakness and a little confusion x1 day.  Associated with polyuria.  Also endorses dry mouth which she attributes to mouth breathing.  She was in her usual state of prior to this event.  Upon ED evaluation, the patient is alert and oriented x3..  She denies subjective fevers or abdominal pain.  No nausea or vomiting.  UA positive for pyuria.  Chest x-ray and noncontrast head CT unremarkable.  The patient was started on Rocephin empirically for presumed UTI and received 500 cc IV fluid bolus in the ED.  TR[facility], hospitalist service, was asked to admit.  ED Course: Tmax 97.3.  BP 141/101, pulse 92.  Respiratory 18, saturation 100% on 2 L.  Lab studies remarkable for 5.1.  Hemoglobin 11.7.  Creatinine 1.37 at baseline, albumin 3.4.  GFR 39.  Review of Systems: Review of systems as noted in the HPI. All other systems reviewed and are negative.   Past Medical History:  Diagnosis Date   Arthritis    fingers   CKD (chronic kidney disease) stage 3, GFR 30-59 ml/min (HCC) 11/30/2017   Depression    Dizziness    in AM, getting out of bed   Dysrhythmia    "skips a beat" sometimes - followed by PCP   Endometrial ca ([place]) 11/30/2017   S/p surgury 1990's   GERD (gastroesophageal reflux disease) 11/30/2017   HLD (hyperlipidemia) 11/30/2017   Hypercholesteremia    Hypertension    Interstitial lung disease ([place])    Neuropathy    bilateral feet   Shortness of breath dyspnea    Sleep apnea     has CPAP, doesn't use   Umbilical hernia    Past Surgical History:  Procedure Laterality Date   ABDOMINAL HYSTERECTOMY     BROW LIFT Bilateral 07/15/2015   Procedure: BLEPHAROPLASTY;  Surgeon: Kathy Starch, MD;  Location: [facility];  Service: Ophthalmology;  Laterality: Bilateral;   CHOLECYSTECTOMY     HAMMER TOE SURGERY     HERNIA REPAIR     KNEE ARTHROSCOPY Bilateral    PTOSIS REPAIR Bilateral 07/15/2015   Procedure: PTOSIS REPAIR;  Surgeon: Kathy Starch, MD;  Location: [facility];  Service: Ophthalmology;  Laterality: Bilateral;  CPAP   TONSILLECTOMY      Social History:  reports that she quit smoking about 35 years ago. Her smoking use included cigarettes. She has a 10.00 pack-year smoking history. She has never used smokeless tobacco. She reports that she does not drink alcohol and does not use drugs.   Allergies  Allergen Reactions   Lipitor [Atorvastatin] Other (See Comments)    Memory issues   Requip [Ropinirole Hcl] Other (See Comments)    Pt reports feeling generally unwell on this medication    Family History  Problem Relation Age of Onset   Congestive Heart Failure Mother    Stroke Father    Parkinson's disease Father    Diabetes Son       Prior to Admission medications   Medication Sig Start Date End Date Taking?  Authorizing Provider  albuterol (VENTOLIN HFA) 108 (90 Base) MCG/ACT inhaler Inhale 1-2 puffs into the lungs every 6 (six) hours as needed for wheezing or shortness of breath. 03/23/21   Kathy Borg, MD  amitriptyline (ELAVIL) 100 MG tablet TAKE 1/2 - 1 TABLET BY MOUTH AT BEDTIME FOR SLEEP AND FEET PAIN 09/28/21   Kathy Borg, MD  apixaban (ELIQUIS) 5 MG TABS tablet Take 1 tablet (5 mg total) by mouth 2 (two) times daily. 03/23/21   Kathy Borg, MD  Cholecalciferol (VITAMIN D-3) 25 MCG (1000 UT) CAPS Take 1 capsule by mouth daily.    [provider]  cyanocobalamin (,VITAMIN B-12,) 1000 MCG/ML injection INJECT 1 ML IM  EVERY 30 DAYS 11/10/21   Kathy Borg, MD  diltiazem (CARDIZEM CD) 120 MG 24 hr capsule Take 1 capsule (120 mg total) by mouth daily. 03/23/21 06/21/21  Kathy Borg, MD  fenofibrate micronized (LOFIBRA) 134 MG capsule TAKE 1 CAPSULE BY MOUTH EVERY DAY IN THE MORNING BEFORE BREAKFAST 03/23/21   Kathy Borg, MD  furosemide (LASIX) 40 MG tablet 1 tab by mouth in the AM, and 1 tab by mouth in the PM as needed for persistent swelling or weight gain more than 3-5 lbs 03/23/21   Kathy Borg, MD  losartan (COZAAR) 100 MG tablet TAKE 1/2 (ONE HALF) TABLET BY MOUTH ONCE DAILY 03/23/21   Kathy Borg, MD  meloxicam (MOBIC) 15 MG tablet TAKE 1 TABLET BY MOUTH EVERY DAY 12/30/20   Kathy Borg, MD  metoprolol succinate (TOPROL-XL) 50 MG 24 hr tablet Take 1 tablet (50 mg total) by mouth daily. Take with or immediately following a meal. 03/23/21   Kathy Borg, MD  pantoprazole (PROTONIX) 40 MG tablet Take 1 tablet (40 mg total) by mouth daily. 03/23/21   Kathy Borg, MD  Respiratory Therapy Supplies (FLUTTER) DEVI Use as directed 02/13/19   Lauraine Rinne, NP  triamcinolone cream (KENALOG) 0.5 % APPLY TO AFFECTED AREA TWICE A DAY 12/30/20   Kathy Borg, MD    Physical Exam: BP (!) 141/101 (BP Location: Right Arm)   Pulse 95   Temp (!) 97.3 F (36.3 C) (Oral)   Resp 18   SpO2 100%   General: 81 y.o. year-old female well developed well nourished in no acute distress.  Alert and oriented x3. Cardiovascular: Regular rate and rhythm with no rubs or gallops.  No thyromegaly or JVD noted.  No lower extremity edema. 2/4 pulses in all 4 extremities. Respiratory: Clear to auscultation with no wheezes or rales. Good inspiratory effort. Abdomen: Soft nontender nondistended with normal bowel sounds x4 quadrants. Muskuloskeletal: No cyanosis, clubbing or edema noted bilaterally Neuro: CN II-XII intact, strength, sensation, reflexes Skin: No ulcerative lesions noted or rashes Psychiatry: Judgement and insight appear  normal. Mood is appropriate for condition and setting          Labs on Admission:  Basic Metabolic Panel: Recent Labs  Lab 12/01/21 2142  NA 139  K 4.2  CL 102  CO2 27  GLUCOSE 107*  BUN 19  CREATININE 1.37*  CALCIUM 8.9   Liver Function Tests: Recent Labs  Lab 12/01/21 2142  AST 25  ALT 11  ALKPHOS 43  BILITOT 0.6  PROT 6.8  ALBUMIN 3.4*   Recent Labs  Lab 12/01/21 2142  LIPASE 27   No results for input(s): AMMONIA in the last 168 hours. CBC: Recent Labs  Lab 12/01/21 2142  WBC  5.1  NEUTROABS 3.1  HGB 11.7*  HCT 36.0  MCV 99.7  PLT 166   Cardiac Enzymes: No results for input(s): CKTOTAL, CKMB, CKMBINDEX, TROPONINI in the last 168 hours.  BNP (last 3 results) No results for input(s): BNP in the last 8760 hours.  ProBNP (last 3 results) No results for input(s): PROBNP in the last 8760 hours.  CBG: No results for input(s): GLUCAP in the last 168 hours.  Radiological Exams on Admission: CT Head Wo Contrast  Result Date: 12/01/2021 CLINICAL DATA:  Mental status change EXAM: CT HEAD WITHOUT CONTRAST TECHNIQUE: Contiguous axial images were obtained from the base of the skull through the vertex without intravenous contrast. RADIATION DOSE REDUCTION: This exam was performed according to the departmental dose-optimization program which includes automated exposure control, adjustment of the mA and/or kV according to patient size and/or use of iterative reconstruction technique. COMPARISON:  None Available. FINDINGS: Brain: No evidence of acute infarction, hemorrhage, hydrocephalus, extra-axial collection or mass lesion/mass effect. Vascular: No hyperdense vessels.  Carotid vascular calcification Skull: Normal. Negative for fracture or focal lesion. Sinuses/Orbits: No acute finding. Other: None IMPRESSION: Negative non contrasted CT appearance of the brain for age Electronically Signed   By: Donavan Foil M.D.   On: 12/01/2021 23:18   DG Chest Port 1 View  Result  Date: 12/01/2021 CLINICAL DATA:  Altered mental status EXAM: PORTABLE CHEST 1 VIEW COMPARISON:  07/08/2019 FINDINGS: Cardiac shadow is stable in appearance. Elevation of the hemidiaphragm on the right is noted increased when compared with the prior exam. Patchy interstitial changes are noted bilaterally and stable likely related to scarring. No bony abnormality is seen. IMPRESSION: Chronic changes without acute abnormality. Electronically Signed   By: Inez Catalina M.D.   On: 12/01/2021 22:13    EKG: I independently viewed the EKG done and my findings are as followed: Atrial fibrillation rate of 101.  Nonspecific ST-T changes.  QTc 477.  Assessment/Plan Present on Admission:  AMS (altered mental status)  Principal Problem:   AMS (altered mental status)  Acute metabolic encephalopathy-in setting of presumed UTI and dehydration. Treat underlying conditions Her mentation is improving Reorient as needed  Generalized weakness PT OT to assess Fall precautions  Presumed UTI, POA UA positive for pyuria Started on Rocephin empirically. Follow urine culture  Prediabetes/with hyperglycemia Obtain hemoglobin A1c Start insulin sliding scale.  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  Chronic systolic CHF Last 2D echo done on 02/18/2020 showed LVEF 45 to 50% Hypovolemic on exam Closely monitor volume status while on gentle IV fluid hydration LR 50 cc/h x 1 day Start strict I's and O's and daily weight Restart home cardiac medications as blood pressure tolerates  GERD Resume home Protonix  Severe morbid obesity Last weight 102.5 kg Recommend weight loss outpatient with regular physical activity and healthy dieting.    DVT prophylaxis: Eliquis  Code Status: Full code  Family Communication: Husband at bedside  Disposition Plan: Admitted to telemetry medical unit  Consults called: None.  Admission status:  Observation status with   Status is: Observation    Kayleen Memos MD [facility] Pager [phone]  If 7PM-7AM, please contact night-coverage [email or web address] Password [email or web address]  12/02/2021, 12:58 AM

## 2021-12-02 NOTE — Evaluation (Signed)
Occupational Therapy Evaluation Patient Details Name: Kathy [name] MRN: [id number] DOB: 06-Jan-1941 Today's Date: 12/02/2021   History of Present Illness 81 y.o. F admitted on 12/01/21 due to AMS, weakness, and polyuria. Work up pending. PMH significant for severe morbid obesity, hyperlipidemia, hypertension, prediabetes, chronic depression, GERD, chronic hypoxia on 2 L nasal cannula continuously.   Clinical Impression   Pt admitted for concerns listed above. PTA pt reported that she was independent with all ADL's and IADL's, except for driving. At this time, pt presents with balance deficits, mild weakness, and per family some confusion. She required supervision to min A for all tasks, due to balance and weakness. She was A&Ox4 and held a relevant conversation throughout session. Recommending HHOT at this time to maximize pts independence and safety. OT will continue to follow acutely.       Recommendations for follow up therapy are one component of a multi-disciplinary discharge planning process, led by the attending physician.  Recommendations may be updated based on patient status, additional functional criteria and insurance authorization.   Follow Up Recommendations  Home health OT    Assistance Recommended at Discharge Intermittent Supervision/Assistance  Patient can return home with the following A little help with walking and/or transfers;A little help with bathing/dressing/bathroom;Assistance with cooking/housework;Direct supervision/assist for financial management;Direct supervision/assist for medications management;Help with stairs or ramp for entrance    Functional Status Assessment  Patient has had a recent decline in their functional status and demonstrates the ability to make significant improvements in function in a reasonable and predictable amount of time.  Equipment Recommendations  None recommended by OT    Recommendations for Other Services       Precautions /  Restrictions Precautions Precautions: Fall Restrictions Weight Bearing Restrictions: No      Mobility Bed Mobility Overal bed mobility: Modified Independent             General bed mobility comments: increased time    Transfers Overall transfer level: Needs assistance Equipment used: None Transfers: Sit to/from Stand Sit to Stand: Supervision           General transfer comment: Supervision for returning to stand from high stretcher, may need increased assist from lower surfaces to power up.      Balance Overall balance assessment: Mild deficits observed, not formally tested                                         ADL either performed or assessed with clinical judgement   ADL Overall ADL's : Needs assistance/impaired Eating/Feeding: Independent;Sitting   Grooming: Supervision/safety;Standing   Upper Body Bathing: Supervision/ safety;Sitting   Lower Body Bathing: Minimal assistance;Sitting/lateral leans;Sit to/from stand   Upper Body Dressing : Supervision/safety;Sitting   Lower Body Dressing: Minimal assistance;Sitting/lateral leans;Sit to/from stand   Toilet Transfer: Min guard;Stand-pivot   Toileting- Water quality scientist and Hygiene: Minimal assistance;Sitting/lateral lean;Sit to/from stand       Functional mobility during ADLs: Min guard General ADL Comments: Pt presents with mild balance deficits, requiring supervision to min A for safety and completion of task     Vision Baseline Vision/History: 1 Wears glasses Ability to See in Adequate Light: 0 Adequate Patient Visual Report: No change from baseline Vision Assessment?: No apparent visual deficits     Perception     Praxis      Pertinent Vitals/Pain Pain Assessment Pain Assessment: 0-10 Pain Score: 3  Pain Location: headache Pain Descriptors / Indicators: Headache Pain Intervention(s): Limited activity within patient's tolerance, Monitored during session,  Repositioned     Hand Dominance Right   Extremity/Trunk Assessment Upper Extremity Assessment Upper Extremity Assessment: Generalized weakness   Lower Extremity Assessment Lower Extremity Assessment: Defer to PT evaluation   Cervical / Trunk Assessment Cervical / Trunk Assessment: Kyphotic   Communication Communication Communication: No difficulties   Cognition Arousal/Alertness: Awake/alert Behavior During Therapy: WFL for tasks assessed/performed Overall Cognitive Status: No family/caregiver present to determine baseline cognitive functioning                                 General Comments: Pt O&Ax4, following all commands and appears [facility] for safety awareness.     General Comments  VSS on 2L, O2 remained 98-100%    Exercises     Shoulder Instructions      Home Living Family/patient expects to be discharged to:: Private residence Living Arrangements: Spouse/significant other Available Help at Discharge: Family Type of Home: House Home Access: Level entry     Home Layout: Two level;Able to live on main level with bedroom/bathroom     Bathroom Shower/Tub: Walk-in shower;Tub/shower unit   Bathroom Toilet: Handicapped height     Home Equipment: Shower seat - built in;Rollator (4 wheels);Wheelchair - manual;Cane - single point          Prior Functioning/Environment Prior Level of Function : Independent/Modified Independent             Mobility Comments: Use rollator for mobiltiy tasks          OT Problem List: Decreased strength;Decreased activity tolerance;Impaired balance (sitting and/or standing);Decreased cognition;Cardiopulmonary status limiting activity      OT Treatment/Interventions: Self-care/ADL training;Therapeutic exercise;Energy conservation;DME and/or AE instruction;Therapeutic activities;Patient/family education;Balance training    OT Goals(Current goals can be found in the care plan section) Acute Rehab OT  Goals Patient Stated Goal: To go home OT Goal Formulation: With patient Time For Goal Achievement: 12/16/21 Potential to Achieve Goals: Good ADL Goals Pt Will Perform Grooming: with modified independence;standing Pt Will Perform Lower Body Bathing: with modified independence;sitting/lateral leans;sit to/from stand Pt Will Perform Lower Body Dressing: with modified independence;sitting/lateral leans;sit to/from stand Pt Will Transfer to Toilet: with modified independence;ambulating Pt Will Perform Toileting - Clothing Manipulation and hygiene: with modified independence;sitting/lateral leans;sit to/from stand  OT Frequency: Min 2X/week    Co-evaluation PT/OT/SLP Co-Evaluation/Treatment: Yes Reason for Co-Treatment: For patient/therapist safety;To address functional/ADL transfers PT goals addressed during session: Mobility/safety with mobility;Balance OT goals addressed during session: ADL's and self-care;Strengthening/ROM      AM-PAC OT "6 Clicks" Daily Activity     Outcome Measure Help from another person eating meals?: None Help from another person taking care of personal grooming?: A Little Help from another person toileting, which includes using toliet, bedpan, or urinal?: A Little Help from another person bathing (including washing, rinsing, drying)?: A Little Help from another person to put on and taking off regular upper body clothing?: A Little Help from another person to put on and taking off regular lower body clothing?: A Little 6 Click Score: 19   End of Session Equipment Utilized During Treatment: Oxygen Nurse Communication: Mobility status  Activity Tolerance: Patient tolerated treatment well Patient left: in chair;with call bell/phone within reach  OT Visit Diagnosis: Unsteadiness on feet (R26.81);Other abnormalities of gait and mobility (R26.89);Muscle weakness (generalized) (M62.81)  Time: 5188-4166 OT Time Calculation (min): 18 min Charges:  OT  General Charges $OT Visit: 1 Visit OT Evaluation $OT Eval Moderate Complexity: 1 Mod  Melisssa Donner H., OTR/L Acute Rehabilitation  Amauri Medellin Elane Yolanda Bonine 12/02/2021, 11:46 AM

## 2021-12-02 NOTE — Evaluation (Signed)
Physical Therapy Evaluation Patient Details Name: Kathy [name] MRN: [id number] DOB: 1941/06/09 Today's Date: 12/02/2021  History of Present Illness  81 y.o. F admitted on 12/01/21 due to AMS, weakness, and polyuria. Work up pending. PMH significant for severe morbid obesity, hyperlipidemia, hypertension, prediabetes, chronic depression, GERD, chronic hypoxia on 2 L nasal cannula continuously.  Clinical Impression  Pt admitted secondary to problem above with deficits below. Pt requiring min guard A to stand and transfer to chair this session. Reaching out for objects to hold to, but pt reports she normally uses rollator. VSS throughout. Recommending HHPT at d/c to address current deficits. Will continue to follow acutely.        Recommendations for follow up therapy are one component of a multi-disciplinary discharge planning process, led by the attending physician.  Recommendations may be updated based on patient status, additional functional criteria and insurance authorization.  Follow Up Recommendations Home health PT    Assistance Recommended at Discharge Frequent or constant Supervision/Assistance (initially)  Patient can return home with the following  A little help with walking and/or transfers;A little help with bathing/dressing/bathroom;Assistance with cooking/housework;Help with stairs or ramp for entrance;Assist for transportation    Equipment Recommendations None recommended by PT  Recommendations for Other Services       Functional Status Assessment Patient has had a recent decline in their functional status and demonstrates the ability to make significant improvements in function in a reasonable and predictable amount of time.     Precautions / Restrictions Precautions Precautions: Fall Restrictions Weight Bearing Restrictions: No      Mobility  Bed Mobility Overal bed mobility: Modified Independent             General bed mobility comments: increased time     Transfers Overall transfer level: Needs assistance Equipment used: None Transfers: Sit to/from Stand, Bed to chair/wheelchair/BSC Sit to Stand: Min guard Stand pivot transfers: Min guard         General transfer comment: Min guard for safety to stand and transfer from higher stretcher. may need increased assist from lower surfaces to power up. Pt reaching out for objects to hold onto in room. Unable to attempt further mobility as no portable oxygen tank in ED    Ambulation/Gait                  Stairs            Wheelchair Mobility    Modified Rankin (Stroke Patients Only)       Balance Overall balance assessment: Mild deficits observed, not formally tested                                           Pertinent Vitals/Pain Pain Assessment Pain Assessment: 0-10 Pain Score: 3  Pain Location: headache Pain Descriptors / Indicators: Headache Pain Intervention(s): Limited activity within patient's tolerance, Monitored during session, Repositioned    Home Living Family/patient expects to be discharged to:: Private residence Living Arrangements: Spouse/significant other Available Help at Discharge: Family Type of Home: House Home Access: Level entry       Home Layout: Two level;Able to live on main level with bedroom/bathroom Home Equipment: Shower seat - built in;Rollator (4 wheels);Wheelchair - manual;Cane - single point      Prior Function Prior Level of Function : Independent/Modified Independent  Mobility Comments: Use rollator for mobiltiy tasks       Hand Dominance   Dominant Hand: Right    Extremity/Trunk Assessment   Upper Extremity Assessment Upper Extremity Assessment: Defer to OT evaluation    Lower Extremity Assessment Lower Extremity Assessment: Generalized weakness    Cervical / Trunk Assessment Cervical / Trunk Assessment: Kyphotic  Communication   Communication: No difficulties   Cognition Arousal/Alertness: Awake/alert Behavior During Therapy: WFL for tasks assessed/performed Overall Cognitive Status: No family/caregiver present to determine baseline cognitive functioning                                 General Comments: Pt O&Ax4, following all commands and appears [facility] for safety awareness.        General Comments General comments (skin integrity, edema, etc.): VSS on 2L, O2 remained 98-100%    Exercises     Assessment/Plan    PT Assessment Patient needs continued PT services  PT Problem List Decreased strength;Decreased balance;Decreased mobility;Decreased activity tolerance       PT Treatment Interventions DME instruction;Gait training;Stair training;Functional mobility training;Therapeutic exercise;Therapeutic activities;Balance training;Patient/family education    PT Goals (Current goals can be found in the Care Plan section)  Acute Rehab PT Goals Patient Stated Goal: to go home PT Goal Formulation: With patient Time For Goal Achievement: 12/16/21 Potential to Achieve Goals: Good    Frequency Min 3X/week     Co-evaluation PT/OT/SLP Co-Evaluation/Treatment: Yes Reason for Co-Treatment: For patient/therapist safety;To address functional/ADL transfers PT goals addressed during session: Balance;Mobility/safety with mobility OT goals addressed during session: ADL's and self-care;Strengthening/ROM       AM-PAC PT "6 Clicks" Mobility  Outcome Measure Help needed turning from your back to your side while in a flat bed without using bedrails?: None Help needed moving from lying on your back to sitting on the side of a flat bed without using bedrails?: None Help needed moving to and from a bed to a chair (including a wheelchair)?: A Little Help needed standing up from a chair using your arms (e.g., wheelchair or bedside chair)?: A Little Help needed to walk in hospital room?: A Little Help needed climbing 3-5 steps with a railing?  : A Lot 6 Click Score: 19    End of Session Equipment Utilized During Treatment: Gait belt Activity Tolerance: Patient tolerated treatment well Patient left: in chair;with call bell/phone within reach (in recliner in ED) Nurse Communication: Mobility status PT Visit Diagnosis: Unsteadiness on feet (R26.81);Muscle weakness (generalized) (M62.81)    Time: 1021-1040 PT Time Calculation (min) (ACUTE ONLY): 19 min   Charges:   PT Evaluation $PT Eval Low Complexity: 1 Low          Reuel Derby, PT, DPT  Acute Rehabilitation Services  Office: [phone]   Rudean Hitt 12/02/2021, 12:14 PM

## 2021-12-02 NOTE — ED Notes (Signed)
Pt repositioned in bed, given water, breakfast ordered

## 2021-12-02 NOTE — ED Notes (Signed)
Butch Penny daughter [phone] requesting an update on the patient

## 2021-12-03 ENCOUNTER — Ambulatory Visit: Payer: Medicare Other | Admitting: Internal Medicine

## 2021-12-03 DIAGNOSIS — R4182 Altered mental status, unspecified: Secondary | ICD-10-CM | POA: Diagnosis not present

## 2021-12-03 LAB — BASIC METABOLIC PANEL
Anion gap: 6 (ref 5–15)
BUN: 15 mg/dL (ref 8–23)
CO2: 29 mmol/L (ref 22–32)
Calcium: 8.7 mg/dL — ABNORMAL LOW (ref 8.9–10.3)
Chloride: 105 mmol/L (ref 98–111)
Creatinine, Ser: 1.4 mg/dL — ABNORMAL HIGH (ref 0.44–1.00)
GFR, Estimated: 38 mL/min — ABNORMAL LOW (ref >60)
Glucose, Bld: 143 mg/dL — ABNORMAL HIGH (ref 70–99)
Potassium: 4.1 mmol/L (ref 3.5–5.1)
Sodium: 140 mmol/L (ref 135–145)

## 2021-12-03 LAB — CBC WITH DIFFERENTIAL/PLATELET
Abs Immature Granulocytes: 0.03 10*3/uL (ref 0.00–0.07)
Basophils Absolute: 0 10*3/uL (ref 0.0–0.1)
Basophils Relative: 1 %
Eosinophils Absolute: 0.2 10*3/uL (ref 0.0–0.5)
Eosinophils Relative: 3 %
HCT: 34.4 % — ABNORMAL LOW (ref 36.0–46.0)
Hemoglobin: 11.5 g/dL — ABNORMAL LOW (ref 12.0–15.0)
Immature Granulocytes: 1 %
Lymphocytes Relative: 22 %
Lymphs Abs: 1.4 10*3/uL (ref 0.7–4.0)
MCH: 32.8 pg (ref 26.0–34.0)
MCHC: 33.4 g/dL (ref 30.0–36.0)
MCV: 98 fL (ref 80.0–100.0)
Monocytes Absolute: 0.5 10*3/uL (ref 0.1–1.0)
Monocytes Relative: 7 %
Neutro Abs: 4.1 10*3/uL (ref 1.7–7.7)
Neutrophils Relative %: 66 %
Platelets: 144 10*3/uL — ABNORMAL LOW (ref 150–400)
RBC: 3.51 MIL/uL — ABNORMAL LOW (ref 3.87–5.11)
RDW: 12.4 % (ref 11.5–15.5)
WBC: 6.2 10*3/uL (ref 4.0–10.5)
nRBC: 0 % (ref 0.0–0.2)

## 2021-12-03 MED ORDER — METOPROLOL SUCCINATE ER 50 MG PO TB24
50.0000 mg | ORAL_TABLET | Freq: Every day | ORAL | Status: DC
  Administered 2021-12-03 – 2021-12-04 (×2): 50 mg via ORAL
  Filled 2021-12-03 (×2): qty 1

## 2021-12-03 MED ORDER — SODIUM CHLORIDE 0.9 % IV SOLN: INTRAVENOUS | Status: DC

## 2021-12-03 MED ORDER — AMITRIPTYLINE HCL 50 MG PO TABS
50.0000 mg | ORAL_TABLET | Freq: Every day | ORAL | Status: DC
  Administered 2021-12-03: 50 mg via ORAL
  Filled 2021-12-03: qty 1

## 2021-12-03 MED ORDER — DILTIAZEM HCL ER COATED BEADS 120 MG PO CP24
120.0000 mg | ORAL_CAPSULE | Freq: Every day | ORAL | Status: DC
Start: 2021-12-03 — End: 2021-12-04
  Administered 2021-12-03: 120 mg via ORAL
  Filled 2021-12-03: qty 1

## 2021-12-03 NOTE — Plan of Care (Signed)

## 2021-12-03 NOTE — Plan of Care (Signed)

## 2021-12-03 NOTE — Progress Notes (Signed)
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 Vaughan Browner    Awoke from nap 12/01/2021 with altered mental status strokelike symptoms EMS reported generalized weakness slow to answer questions   Mucosa found to be very dry-she found to have more increasing weakness with her ambulation with walker   BUNs/creatinine on arrival 19/1.3 close to baseline-->17/1.2 WBC 5.1 hemoglobin 11.7 platelets 166 CT head no acute findings, CXR portable chronic changes without acute abnormality   Urine analysis large leukocytes negative nitrates rare bacteria small hemoglobin-admitted for presumed UTI    Hospital-Problem based course  ?  UTI versus asymptomatic bacteriuria Continue Rocephin at this time start saline 50 cc/h De-escalate as needed in the next 24 hours if no growth would discontinue antibiotics ?  Strokelike symptoms Patient has no focal deficit CT was negative on admit OvS negative-giving IV fluids today to monitor trends Atrial fibrillation CHADVASC >4/Eliquis HFrEF 45%-baseline weight about 225 Patient has some RVR this morning  probably from beta-blocker withdrawal and Cardizem that was held Reinitiated Cardizem 120, Toprol-XL 50 Mild AKI--- probably underlying CKD 2 On discharge probably would hold losartan 50 daily, Mobic 15 q. OD-May need to use Lasix q. OD on discharge (was on Lasix daily) Underlying ILD complicated by COVID in 3382 quit smoking 1998 Needs outpatient  follow-up Dr. Vaughan Browner  DVT prophylaxis: Eliquis I Code Status: Full Family Communication: None present at the bedside today discussed with family 6/8 Disposition:  Status is: Inpatient Remains inpatient appropriate because:   Requires better rate control in addition to other management   Consultants:    Procedures:   Antimicrobials:     Subjective: Awake coherent no distress no focal abnormalities no burning in the urine States she feels a little bit weak and has coughed "all night" Will monitor  Objective: Vitals:   12/03/21 0526 12/03/21 0811 12/03/21 1129 12/03/21 1330  BP: 139/88 (!) 128/113 (!) 139/94   Pulse: (!) 116 (!) 136 (!) 118   Resp: '20 16 19   '$ Temp: 98.1 F (36.7 C) 98.3 F (36.8 C) 98.4 F (36.9 C)   TempSrc:  Oral Oral   SpO2:  99% 95% 98%  Weight:      Height:        Intake/Output Summary (Last 24 hours) at 12/03/2021 1527 Last data filed at 12/03/2021 1300 Gross per 24 hour  Intake 1326.67 ml  Output 1600 ml  Net -273.33 ml   Filed Weights   12/02/21 0900 12/02/21 2035 12/03/21 0500  Weight: 102.5 kg 120.5 kg 105 kg    Examination:  EOMI NCAT thick neck Mallampati 4 moderate dentition on oxygen S1-S2 tachycardic Chest clear no rales rhonchi absent No wheeze Abdomen is quite obese cannot appreciate HSM cannot appreciate splenomegaly ROM is intact grossly but limited by habitus and girth-reflexes not tested sensory grossly intact  Data Reviewed: personally reviewed   CBC    Component Value Date/Time   WBC 6.2 12/03/2021 0921   RBC 3.51 (L) 12/03/2021 0921   HGB 11.5 (L) 12/03/2021 5053  HCT 34.4 (L) 12/03/2021 0921   PLT 144 (L) 12/03/2021 0921   MCV 98.0 12/03/2021 0921   MCH 32.8 12/03/2021 0921   MCHC 33.4 12/03/2021 0921   RDW 12.4 12/03/2021 0921   LYMPHSABS 1.4 12/03/2021 0921   MONOABS 0.5 12/03/2021 0921   EOSABS 0.2 12/03/2021 0921   BASOSABS 0.0 12/03/2021 0921      Latest Ref Rng & Units 12/03/2021    9:21 AM  12/02/2021    4:45 AM 12/01/2021    9:42 PM  CMP  Glucose 70 - 99 mg/dL 143  89  107   BUN 8 - 23 mg/dL '15  17  19   '$ Creatinine 0.44 - 1.00 mg/dL 1.40  1.23  1.37   Sodium 135 - 145 mmol/L 140  139  139   Potassium 3.5 - 5.1 mmol/L 4.1  3.8  4.2   Chloride 98 - 111 mmol/L 105  101  102   CO2 22 - 32 mmol/L '29  28  27   '$ Calcium 8.9 - 10.3 mg/dL 8.7  8.8  8.9   Total Protein 6.5 - 8.1 g/dL  6.3  6.8   Total Bilirubin 0.3 - 1.2 mg/dL  0.7  0.6   Alkaline Phos 38 - 126 U/L  40  43   AST 15 - 41 U/L  21  25   ALT 0 - 44 U/L  11  11      Radiology Studies: CT Head Wo Contrast  Result Date: 12/01/2021 CLINICAL DATA:  Mental status change EXAM: CT HEAD WITHOUT CONTRAST TECHNIQUE: Contiguous axial images were obtained from the base of the skull through the vertex without intravenous contrast. RADIATION DOSE REDUCTION: This exam was performed according to the departmental dose-optimization program which includes automated exposure control, adjustment of the mA and/or kV according to patient size and/or use of iterative reconstruction technique. COMPARISON:  None Available. FINDINGS: Brain: No evidence of acute infarction, hemorrhage, hydrocephalus, extra-axial collection or mass lesion/mass effect. Vascular: No hyperdense vessels.  Carotid vascular calcification Skull: Normal. Negative for fracture or focal lesion. Sinuses/Orbits: No acute finding. Other: None IMPRESSION: Negative non contrasted CT appearance of the brain for age Electronically Signed   By: Donavan Foil M.D.   On: 12/01/2021 23:18   DG Chest Port 1 View  Result Date: 12/01/2021 CLINICAL DATA:  Altered mental status EXAM: PORTABLE CHEST 1 VIEW COMPARISON:  07/08/2019 FINDINGS: Cardiac shadow is stable in appearance. Elevation of the hemidiaphragm on the right is noted increased when compared with the prior exam. Patchy interstitial changes are noted bilaterally and stable likely related to scarring. No bony abnormality is seen.  IMPRESSION: Chronic changes without acute abnormality. Electronically Signed   By: Inez Catalina M.D.   On: 12/01/2021 22:13     Scheduled Meds:  amitriptyline  50 mg Oral QHS   apixaban  5 mg Oral BID   diltiazem  120 mg Oral Daily   fenofibrate  54 mg Oral Daily   metoprolol succinate  50 mg Oral Daily   pantoprazole  40 mg Oral Daily   Continuous Infusions:  sodium chloride     cefTRIAXone (ROCEPHIN)  IV Stopped (12/02/21 2039)     LOS: 1 day   Time spent: [place], MD [facility] To contact the attending provider between 7A-7P or the covering provider during after hours 7P-7A, please log into the web site [email or web address] and access using universal [place] password for that web site. If you do  not have the password, please call the hospital operator.  12/03/2021, 3:27 PM

## 2021-12-03 NOTE — TOC Initial Note (Addendum)
Transition of Care [facility]) - Initial/Assessment Note    Patient Details  Name: Kathy [name] MRN: [id number] Date of Birth: 1940/07/10  Transition of Care [facility]) CM/SW Contact:    Sharin Mons, RN Phone Number: 12/03/2021, 11:34 AM  Clinical Narrative:                 Admitted with AMS, weakness, and polyuria. From home with husband. PTA independent with ADL's. Pt with DME: W/C ,rolator, oxygen @ home ( [employer]). NCM spoke with pt @ bedside  regarding  d/c planning. Shared PT/OT evaluation : home health services. Pt agreeable to home health services. Pt without preference. Referral made with [facility] and accepted.   Expected Discharge Plan: [place] Barriers to Discharge: Continued Medical Work up   Patient Goals and CMS Choice     Choice offered to / list presented to : Patient  Expected Discharge Plan and Services Expected Discharge Plan: [place]   Discharge Planning Services: CM Consult   Living arrangements for the past 2 months: Single Family Home                           HH Arranged: PT, OT, RN, Disease Management [place] Agency: [place] Date [facility] Agency Contacted: 12/03/21 Time HH Agency Contacted: 63 Representative spoke with at [place]: Malachy Mood  Prior Living Arrangements/Services Living arrangements for the past 2 months: [place] with:: Spouse Patient language and need for interpreter reviewed:: Yes Do you feel safe going back to the place where you live?: Yes      Need for Family Participation in Patient Care: Yes (Comment) Care giver support system in place?: Yes (comment) Current home services: DME (W/C, rolator, oxygen( [employer])) Criminal Activity/Legal Involvement Pertinent to Current Situation/Hospitalization: No - Comment as needed  Activities of Daily Living Home Assistive Devices/Equipment: None ADL Screening (condition at time of admission) Patient's  cognitive ability adequate to safely complete daily activities?: Yes Is the patient deaf or have difficulty hearing?: No Does the patient have difficulty seeing, even when wearing glasses/contacts?: No Does the patient have difficulty concentrating, remembering, or making decisions?: No Patient able to express need for assistance with ADLs?: Yes Does the patient have difficulty dressing or bathing?: No Independently performs ADLs?: Yes (appropriate for developmental age) Does the patient have difficulty walking or climbing stairs?: No Weakness of Legs: None Weakness of Arms/Hands: None  Permission Sought/Granted   Permission granted to share information with : Yes, Verbal Permission Granted  Share Information with NAME: Timmothy Sours (spouse) [phone]           Emotional Assessment Appearance:: Appears stated age Attitude/Demeanor/Rapport: Gracious Affect (typically observed): Accepting Orientation: : Oriented to Self, Oriented to Place, Oriented to  Time, Oriented to Situation Alcohol / Substance Use: Not Applicable Psych Involvement: No (comment)  Admission diagnosis:  Acute cystitis without hematuria [N30.00] AKI (acute kidney injury) ([place]) [N17.9] Altered mental status, unspecified altered mental status type [R41.82] AMS (altered mental status) [R41.82] Patient Active Problem List   Diagnosis Date Noted   AMS (altered mental status) 12/02/2021   AKI (acute kidney injury) ([place]) 12/02/2021   Urinary frequency 09/22/2021   Peripheral neuropathy 03/23/2021   Pulmonary hypertension, unspecified ([place]) 12/04/2020   Atrial fibrillation ([place]) 07/11/2020   HFrEF (heart failure with reduced ejection fraction) ([place]) 07/11/2020   Positive ANA (antinuclear antibody) 05/01/2020   Osteoarthritis 05/01/2020   Bilateral primary  osteoarthritis of knee 05/01/2020   Aortic atherosclerosis ([place]) 04/18/2020   COVID-19 virus infection 07/31/2019   Bilateral knee pain 06/10/2019   Leg swelling  02/13/2019   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 ([place]) 86/76[birth year]1950   Physical deconditioning 02/13/2019   Vitamin D deficiency 12/04/2018   B12 deficiency 12/04/2018   Hyperglycemia 11/16/2018   Abdominal pain 06/02/2018   Acute on chronic kidney failure ([place]) 93/26/7124   Diastolic dysfunction 58/02/9832   Endometrial ca ([place]) 11/30/2017   GERD (gastroesophageal reflux disease) 11/30/2017   HLD (hyperlipidemia) 11/30/2017   CKD (chronic kidney disease) stage 3, GFR 30-59 ml/min (HCC) 11/30/2017   Dyspnea on exertion 11/07/2017   Cough 11/07/2017   ILD (interstitial lung disease) ([place]) 03/15/2017   Chronic respiratory failure with hypoxia ([place]) 03/15/2017   Oral herpes simplex infection    Influenza with pneumonia 06/28/2016   Community acquired pneumonia 06/27/2016   Essential hypertension 06/27/2016   Depression 06/27/2016   Sleep apnea 06/27/2016   PCP:  Biagio Borg, MD Pharmacy:   [id number] [id number]: [phone]: [phone]    Social Determinants of Health (SDOH) Interventions    Readmission Risk Interventions     No data to display

## 2021-12-03 NOTE — Progress Notes (Signed)
Physical Therapy Treatment Patient Details Name: Kathy [name] MRN: [id number] DOB: Jan 27, 1941 Today's Date: 12/03/2021   History of Present Illness 81 y.o. F admitted on 12/01/21 due to AMS, weakness, and polyuria. Work up pending. PMH significant for severe morbid obesity, hyperlipidemia, hypertension, prediabetes, chronic depression, GERD, chronic hypoxia on 2 L nasal cannula continuously.    PT Comments    Patient making significant progress since last session. Ambulating in hallway with supervision and Rw. VSS on 2L O2 [place] throughout. Patient has met all PT goals. She will have necessary supervision at discharge for mobility. Will defer further mobility to mobility specialists and nursing staff to improve endurance. Recommend HHPT at discharge to maximize functional independence and safety.     Recommendations for follow up therapy are one component of a multi-disciplinary discharge planning process, led by the attending physician.  Recommendations may be updated based on patient status, additional functional criteria and insurance authorization.  Follow Up Recommendations  Home health PT     Assistance Recommended at Discharge Frequent or constant Supervision/Assistance  Patient can return home with the following A little help with walking and/or transfers;A little help with bathing/dressing/bathroom;Assistance with cooking/housework;Help with stairs or ramp for entrance;Assist for transportation   Equipment Recommendations  None recommended by PT    Recommendations for Other Services       Precautions / Restrictions Precautions Precautions: Fall Precaution Comments: supplemental O2 at baseline Restrictions Weight Bearing Restrictions: No     Mobility  Bed Mobility Overal bed mobility: Modified Independent                  Transfers Overall transfer level: Modified independent Equipment used: Rolling Reedy Biernat (2 wheels)                     Ambulation/Gait Ambulation/Gait assistance: Supervision Gait Distance (Feet): 125 Feet Assistive device: Rolling Zahra Peffley (2 wheels) Gait Pattern/deviations: Step-through pattern, Decreased stride length Gait velocity: decreased     General Gait Details: supervision for safety and line management with O2 tank   Stairs             Wheelchair Mobility    Modified Rankin (Stroke Patients Only)       Balance Overall balance assessment: Mild deficits observed, not formally tested                                          Cognition Arousal/Alertness: Awake/alert Behavior During Therapy: WFL for tasks assessed/performed Overall Cognitive Status: Within Functional Limits for tasks assessed                                          Exercises      General Comments General comments (skin integrity, edema, etc.): VSS on 2 L O2 [place]      Pertinent Vitals/Pain Pain Assessment Pain Assessment: Faces Faces Pain Scale: No hurt Pain Intervention(s): Monitored during session    Home Living                          Prior Function            PT Goals (current goals can now be found in the care plan section) Acute Rehab PT Goals Patient Stated Goal: to go  home PT Goal Formulation: With patient Time For Goal Achievement: 12/16/21 Potential to Achieve Goals: Good Progress towards PT goals: Goals met/education completed, patient discharged from PT    Frequency    Min 3X/week      PT Plan Current plan remains appropriate    Co-evaluation              AM-PAC PT "6 Clicks" Mobility   Outcome Measure  Help needed turning from your back to your side while in a flat bed without using bedrails?: None Help needed moving from lying on your back to sitting on the side of a flat bed without using bedrails?: None Help needed moving to and from a bed to a chair (including a wheelchair)?: A Little Help needed standing up  from a chair using your arms (e.g., wheelchair or bedside chair)?: A Little Help needed to walk in hospital room?: A Little Help needed climbing 3-5 steps with a railing? : A Lot 6 Click Score: 19    End of Session Equipment Utilized During Treatment: Oxygen Activity Tolerance: Patient tolerated treatment well Patient left: in bed;with call bell/phone within reach;with family/visitor present Nurse Communication: Mobility status PT Visit Diagnosis: Unsteadiness on feet (R26.81);Muscle weakness (generalized) (M62.81)     Time: 8413-2440 PT Time Calculation (min) (ACUTE ONLY): 24 min  Charges:  $Therapeutic Activity: 23-37 mins                     Kathy [name] A. Gilford Rile PT, DPT Acute Rehabilitation Services Office [phone]    Linna Hoff 12/03/2021, 5:12 PM

## 2021-12-04 DIAGNOSIS — R4182 Altered mental status, unspecified: Secondary | ICD-10-CM | POA: Diagnosis not present

## 2021-12-04 LAB — COMPREHENSIVE METABOLIC PANEL
ALT: 10 U/L (ref 0–44)
AST: 19 U/L (ref 15–41)
Albumin: 3.1 g/dL — ABNORMAL LOW (ref 3.5–5.0)
Alkaline Phosphatase: 43 U/L (ref 38–126)
Anion gap: 5 (ref 5–15)
BUN: 18 mg/dL (ref 8–23)
CO2: 28 mmol/L (ref 22–32)
Calcium: 8.9 mg/dL (ref 8.9–10.3)
Chloride: 104 mmol/L (ref 98–111)
Creatinine, Ser: 1.24 mg/dL — ABNORMAL HIGH (ref 0.44–1.00)
GFR, Estimated: 44 mL/min — ABNORMAL LOW (ref >60)
Glucose, Bld: 103 mg/dL — ABNORMAL HIGH (ref 70–99)
Potassium: 4.4 mmol/L (ref 3.5–5.1)
Sodium: 137 mmol/L (ref 135–145)
Total Bilirubin: 0.6 mg/dL (ref 0.3–1.2)
Total Protein: 6.4 g/dL — ABNORMAL LOW (ref 6.5–8.1)

## 2021-12-04 LAB — URINE CULTURE: Culture: >=100000 — AB

## 2021-12-04 MED ORDER — DILTIAZEM HCL ER BEADS 180 MG PO CP24: 180.0000 mg | ORAL_CAPSULE | Freq: Every day | ORAL | 2 refills | Status: DC

## 2021-12-04 MED ORDER — DILTIAZEM HCL ER COATED BEADS 180 MG PO CP24
180.0000 mg | ORAL_CAPSULE | Freq: Every day | ORAL | Status: DC
  Administered 2021-12-04: 180 mg via ORAL
  Filled 2021-12-04: qty 1

## 2021-12-04 MED ORDER — FUROSEMIDE 40 MG PO TABS: 40.0000 mg | ORAL_TABLET | ORAL | 11 refills | Status: DC

## 2021-12-04 NOTE — Discharge Summary (Signed)
Physician Discharge Summary  Kathy [name] XEN:[id number] DOB: Oct 30, 1940 DOA: 12/01/2021  PCP: Kathy Borg, MD  Admit date: 12/01/2021 Discharge date: 12/04/2021  Time spent: 36 minutes  Recommendations for Outpatient Follow-up:  Needs Chem-12 CBC in about 1 week Suggest implementation of Lasix daily again depending on labs had some mild volume depletion this hospital stay and changed to every other day-losartan was discontinued this hospital stay in addition Note increasing Cardizem because of uncontrolled A-fib in the mornings which seems to resolve later on in the day  Discharge Diagnoses:  MAIN problem for hospitalization   Debility weakness and concern for UTI  Please see below for itemized issues addressed in [name]- refer to other progress notes for clarity if needed  Discharge Condition:  Improved  Diet recommendation:  Heart healthy  Filed Weights   12/02/21 0900 12/02/21 2035 12/03/21 0500  Weight: 102.5 kg 120.5 kg 105 kg    History of present illness:  81 year old white female home dwelling ambulates with a walker Underlying OSA, HTN, HLD, prior oral herpes simplex, endometrial CA status post surgery 2019 CKD 3 AA, peripheral neuropathy HFrEF EF 45-50% followed by Dr. Virgina [name] on diuretics-baseline weight around 225 Persistent A-fib CHADVASC around 4 on Eliquis COVID-19 recovered 2021 with progression of baseline ILD-at baseline on 2 L of oxygen-quit smoking 1998 follows previously by Dr. Vaughan [name]    Awoke from nap 12/01/2021 with altered mental status strokelike symptoms EMS reported generalized weakness slow to answer questions   Mucosa found to be very dry-she found to have more increasing weakness with her ambulation with walker   BUNs/creatinine on arrival 19/1.3 close to baseline-->17/1.2 WBC 5.1 hemoglobin 11.7 platelets 166 CT head no acute findings, CXR portable chronic changes without acute abnormality   Urine analysis large leukocytes negative nitrates  rare bacteria small hemoglobin-admitted for presumed UTI  Eventually urine grew out E. coli but this was felt to be asymptomatic bacteriuria after evaluation and discussion with patient-see below  Hospital Course:  Patient had asymptomatic bacteriuria no systemic symptoms of pyuria or anything else was given Rocephin for 3 days and completely discontinued as she had no other Signs or symptoms of local or systemic infection Should not require any further antibiotics ?  Strokelike symptoms Patient has no focal deficit CT was negative on admit OvS negative-giving IV fluids today to monitor trends Atrial fibrillation CHADVASC >4/Eliquis HFrEF 45%-baseline weight about 225 She had some mildly difficult to control heart rates in the mornings probably because of withdrawal of her long-acting we reinitiated Cardizem and increase it to 180 on discharge She will continue her usual Toprol-XL 50 and will continue her Eliquis twice daily Mild AKI--- probably underlying CKD 2 We did hold losartan 50 daily, Mobic 15 q. OD- Because of mild AKI we did change Lasix q. OD on discharge (was on Lasix daily) Underlying ILD complicated by COVID in 1031 quit smoking 1998 Needs outpatient follow-up Dr. Vaughan [name]     Discharge Exam: Vitals:   12/03/21 1951 12/04/21 0737  BP: 128/79 (!) 137/110  Pulse: (!) 107 (!) 127  Resp: 20 20  Temp: 98.2 F (36.8 C) 98 F (36.7 C)  SpO2: 95% 97%    Subj on day of d/c   Awake coherent has mild headache Some reproducible chest pain-otherwise feels fair No fever no chills no abdominal pain  General Exam on discharge  EOMI NCAT no focal deficit no wheeze rales rhonchi Abdomen is soft nontender She has slightly swollen lower extremities ROM is intact  CTA B no added sound Power 5/5 grossly bilaterally  Discharge Instructions   Discharge Instructions     Diet - low sodium heart healthy   Complete by: As directed    Discharge instructions   Complete by: As  directed    You will notice that several of your medications have changed we have discontinued your losartan and changed your Lasix to every other day because you are not dehydrated this hospitalization We do not think that you had a stroke It appears that you had a little bit of uncontrolled atrial fibrillation so we increased her Cardizem to 180 mg daily instead of 120 He should continue on metoprolol We will get home health therapy, could help you with Being around and mobilization issue continue oxygen at all times It would be a good idea to follow-up with Dr. Jenny [name] for labs in about 1 week and let him know how you are doing   Increase activity slowly   Complete by: As directed       Allergies as of 12/04/2021       Reactions   Lipitor [atorvastatin] Other (See Comments)   Memory issues   Requip [ropinirole Hcl] Other (See Comments)   Pt reports feeling generally unwell on this medication        Medication List     STOP taking these medications    diltiazem 120 MG 24 hr capsule Commonly known as: CARDIZEM CD Replaced by: diltiazem 180 MG 24 hr capsule   losartan 100 MG tablet Commonly known as: COZAAR   triamcinolone cream 0.5 % Commonly known as: KENALOG       TAKE these medications    albuterol 108 (90 Base) MCG/ACT inhaler Commonly known as: VENTOLIN HFA Inhale 1-2 puffs into the lungs every 6 (six) hours as needed for wheezing or shortness of breath.   amitriptyline 100 MG tablet Commonly known as: ELAVIL TAKE 1/2 - 1 TABLET BY MOUTH AT BEDTIME FOR SLEEP AND FEET PAIN What changed:  how much to take how to take this when to take this additional instructions   apixaban 5 MG Tabs tablet Commonly known as: ELIQUIS Take 1 tablet (5 mg total) by mouth 2 (two) times daily.   cyanocobalamin 1000 MCG/ML injection Commonly known as: (VITAMIN B-12) INJECT 1 ML IM EVERY 30 DAYS What changed:  how much to take how to take this when to take this additional  instructions   diltiazem 180 MG 24 hr capsule Commonly known as: TIAZAC Take 1 capsule (180 mg total) by mouth daily. Replaces: diltiazem 120 MG 24 hr capsule   fenofibrate micronized 134 MG capsule Commonly known as: LOFIBRA TAKE 1 CAPSULE BY MOUTH EVERY DAY IN THE MORNING BEFORE BREAKFAST What changed:  how much to take how to take this when to take this additional instructions   Flutter Devi Use as directed   furosemide 40 MG tablet Commonly known as: Lasix Take 1 tablet (40 mg total) by mouth every other day. 1 tab by mouth in the AM, and 1 tab by mouth in the PM as needed for persistent swelling or weight gain more than 3-5 lbs What changed:  how much to take how to take this when to take this   metoprolol succinate 50 MG 24 hr tablet Commonly known as: TOPROL-XL Take 1 tablet (50 mg total) by mouth daily. Take with or immediately following a meal. What changed: additional instructions   pantoprazole 40 MG tablet Commonly known as: PROTONIX Take 1  tablet (40 mg total) by mouth daily.   Vitamin D-3 25 MCG (1000 UT) Caps Take 1 capsule by mouth daily.               Durable Medical Equipment  (From admission, onward)           Start     Ordered   12/04/21 0854  DME Oxygen  Once       Question Answer Comment  Length of Need Lifetime   Mode or (Route) Nasal cannula   Liters per Minute 2   Oxygen delivery system Gas      12/04/21 0853           Allergies  Allergen Reactions   Lipitor [Atorvastatin] Other (See Comments)    Memory issues   Requip [Ropinirole Hcl] Other (See Comments)    Pt reports feeling generally unwell on this medication    Follow-up Information     Care, [place] Follow up.   Why: Home health services will be provided by [facility], start of care within 48 hours post discharge Contact information: [place] [address] [phone]         Kathy Borg, MD Follow up.    Specialties: Internal Medicine, Radiology Contact information: [place] [address] [phone]                  The results of significant diagnostics from this hospitalization (including imaging, microbiology, ancillary and laboratory) are listed below for reference.    Significant Diagnostic Studies: CT Head Wo Contrast  Result Date: 12/01/2021 CLINICAL DATA:  Mental status change EXAM: CT HEAD WITHOUT CONTRAST TECHNIQUE: Contiguous axial images were obtained from the base of the skull through the vertex without intravenous contrast. RADIATION DOSE REDUCTION: This exam was performed according to the departmental dose-optimization program which includes automated exposure control, adjustment of the mA and/or kV according to patient size and/or use of iterative reconstruction technique. COMPARISON:  None Available. FINDINGS: Brain: No evidence of acute infarction, hemorrhage, hydrocephalus, extra-axial collection or mass lesion/mass effect. Vascular: No hyperdense vessels.  Carotid vascular calcification Skull: Normal. Negative for fracture or focal lesion. Sinuses/Orbits: No acute finding. Other: None IMPRESSION: Negative non contrasted CT appearance of the brain for age Electronically Signed   By: Donavan Foil M.D.   On: 12/01/2021 23:18   DG Chest Port 1 View  Result Date: 12/01/2021 CLINICAL DATA:  Altered mental status EXAM: PORTABLE CHEST 1 VIEW COMPARISON:  07/08/2019 FINDINGS: Cardiac shadow is stable in appearance. Elevation of the hemidiaphragm on the right is noted increased when compared with the prior exam. Patchy interstitial changes are noted bilaterally and stable likely related to scarring. No bony abnormality is seen. IMPRESSION: Chronic changes without acute abnormality. Electronically Signed   By: Inez Catalina M.D.   On: 12/01/2021 22:13    Microbiology: Recent Results (from the past 240 hour(s))  Urine Culture     Status: Abnormal   Collection  Time: 12/01/21  9:54 PM   Specimen: Urine, Clean Catch  Result Value Ref Range Status   Specimen Description URINE, CLEAN CATCH  Final   Special Requests   Final    NONE Performed at [facility], [place] [address] [place],  [address]    Culture >=100,000 COLONIES/mL ESCHERICHIA COLI (A)  Final   Report Status 12/04/2021 FINAL  Final   Organism ID, Bacteria ESCHERICHIA COLI (A)  Final  Susceptibility   Escherichia coli - MIC*    AMPICILLIN 16 INTERMEDIATE Intermediate     CEFAZOLIN <=4 SENSITIVE Sensitive     CEFEPIME <=0.12 SENSITIVE Sensitive     CEFTRIAXONE <=0.25 SENSITIVE Sensitive     CIPROFLOXACIN >=4 RESISTANT Resistant     GENTAMICIN <=1 SENSITIVE Sensitive     IMIPENEM <=0.25 SENSITIVE Sensitive     NITROFURANTOIN <=16 SENSITIVE Sensitive     TRIMETH/SULFA <=20 SENSITIVE Sensitive     AMPICILLIN/SULBACTAM 4 SENSITIVE Sensitive     PIP/TAZO <=4 SENSITIVE Sensitive     * >=100,000 COLONIES/mL ESCHERICHIA COLI     Labs: Basic Metabolic Panel: Recent Labs  Lab 12/01/21 2142 12/02/21 0445 12/03/21 0921 12/04/21 0447  NA 139 139 140 137  K 4.2 3.8 4.1 4.4  CL 102 101 105 104  CO2 '27 28 29 28  '$ GLUCOSE 107* 89 143* 103*  BUN '19 17 15 18  '$ CREATININE 1.37* 1.23* 1.40* 1.24*  CALCIUM 8.9 8.8* 8.7* 8.9  MG  --  2.2  --   --   PHOS  --  4.1  --   --    Liver Function Tests: Recent Labs  Lab 12/01/21 2142 12/02/21 0445 12/04/21 0447  AST '25 21 19  '$ ALT '11 11 10  '$ ALKPHOS 43 40 43  BILITOT 0.6 0.7 0.6  PROT 6.8 6.3* 6.4*  ALBUMIN 3.4* 3.1* 3.1*   Recent Labs  Lab 12/01/21 2142  LIPASE 27   No results for input(s): "AMMONIA" in the last 168 hours. CBC: Recent Labs  Lab 12/01/21 2142 12/02/21 0445 12/03/21 0921  WBC 5.1 5.2 6.2  NEUTROABS 3.1 2.9 4.1  HGB 11.7* 12.1 11.5*  HCT 36.0 37.1 34.4*  MCV 99.7 98.7 98.0  PLT 166 149* 144*   Cardiac Enzymes: No results for input(s): "CKTOTAL", "CKMB", "CKMBINDEX", "TROPONINI" in the last 168  hours. BNP: BNP (last 3 results) No results for input(s): "BNP" in the last 8760 hours.  ProBNP (last 3 results) No results for input(s): "PROBNP" in the last 8760 hours.  CBG: No results for input(s): "GLUCAP" in the last 168 hours.     Signed:  Nita Sells MD   [facility] 12/04/2021, 8:54 AM

## 2021-12-04 NOTE — TOC Transition Note (Signed)
Transition of Care [facility]) - CM/SW Discharge Note   Patient Details  Name: Kathy [name] MRN: [id number] Date of Birth: 07/21/1940  Transition of [facility]) CM/SW Contact:  Sharin Mons, RN Phone Number: 12/04/2021, 11:13 AM   Clinical Narrative:    Patient will DC to: home Anticipated DC date: 12/04/2021 Family notified: yes Transport by: car  Per MD patient ready for DC. RN, patient, patient's family, and [facility] notified of DC. Pt without DME needs. No Rx med concerns. Husband to provide transportation to home. Post hospital f/u noted on AVS.  RNCM will sign off for now as intervention is no longer needed. Please consult [place] again if new needs arise.    Final next level of care: Home w Home Health Services Barriers to Discharge: No Barriers Identified   Patient Goals and CMS Choice     Choice offered to / list presented to : Patient  Discharge Placement                       Discharge Plan and Services   Discharge Planning Services: CM Consult                      HH Arranged: PT, OT, RN, Disease Management [place] Agency: [place] Date [facility] Agency Contacted: 12/03/21 Time HH Agency Contacted: 41 Representative spoke with at [place]: [place] Determinants of Health ([place]) Interventions     Readmission Risk Interventions     No data to display

## 2021-12-04 NOTE — Plan of Care (Signed)

## 2021-12-07 ENCOUNTER — Telehealth: Payer: Self-pay

## 2021-12-07 NOTE — Telephone Encounter (Signed)
Transition Care Management Follow-up Telephone Call Date of discharge and from where: [facility] 12-04-21 Dx: weakness/UTI How have you been since you were released from the hospital? Doing ok  Any questions or concerns? No  Items Reviewed: Did the pt receive and understand the discharge instructions provided? Yes  Medications obtained and verified? Yes  Other? No  Any new allergies since your discharge? No  Dietary orders reviewed? Yes Do you have support at home? Yes   Home Care and Equipment/Supplies: Were home health services ordered? no If so, what is the name of the agency? na  Has the agency set up a time to come to the patient's home? not applicable Were any new equipment or medical supplies ordered?  Yes: oxygen  What is the name of the medical supply agency? In the home already  Were you able to get the supplies/equipment? not applicable Do you have any questions related to the use of the equipment or supplies? No  Functional Questionnaire: (I = Independent and D = Dependent) ADLs: I  Bathing/Dressing- I  Meal Prep- I  Eating- I  Maintaining continence- I  Transferring/Ambulation- I-WALKER  Managing Meds- I  Follow up appointments reviewed:  PCP Hospital f/u appt confirmed? Yes  Scheduled to see Dr Jenny Reichmann on 12-11-21 @ 140pm. [facility] f/u appt confirmed? no Are transportation arrangements needed? No  If their condition worsens, is the pt aware to call PCP or go to the Emergency Dept.? Yes Was the patient provided with contact information for the PCP's office or ED? Yes Was to pt encouraged to call back with questions or concerns? Yes

## 2021-12-09 DIAGNOSIS — K219 Gastro-esophageal reflux disease without esophagitis: Secondary | ICD-10-CM | POA: Diagnosis not present

## 2021-12-09 DIAGNOSIS — G63 Polyneuropathy in diseases classified elsewhere: Secondary | ICD-10-CM | POA: Diagnosis not present

## 2021-12-09 DIAGNOSIS — M19049 Primary osteoarthritis, unspecified hand: Secondary | ICD-10-CM | POA: Diagnosis not present

## 2021-12-09 DIAGNOSIS — Z8616 Personal history of COVID-19: Secondary | ICD-10-CM | POA: Diagnosis not present

## 2021-12-09 DIAGNOSIS — E7800 Pure hypercholesterolemia, unspecified: Secondary | ICD-10-CM | POA: Diagnosis not present

## 2021-12-09 DIAGNOSIS — N183 Chronic kidney disease, stage 3 unspecified: Secondary | ICD-10-CM | POA: Diagnosis not present

## 2021-12-09 DIAGNOSIS — G9341 Metabolic encephalopathy: Secondary | ICD-10-CM | POA: Diagnosis not present

## 2021-12-09 DIAGNOSIS — K429 Umbilical hernia without obstruction or gangrene: Secondary | ICD-10-CM | POA: Diagnosis not present

## 2021-12-09 DIAGNOSIS — Z9981 Dependence on supplemental oxygen: Secondary | ICD-10-CM | POA: Diagnosis not present

## 2021-12-09 DIAGNOSIS — J9611 Chronic respiratory failure with hypoxia: Secondary | ICD-10-CM | POA: Diagnosis not present

## 2021-12-09 DIAGNOSIS — I4821 Permanent atrial fibrillation: Secondary | ICD-10-CM | POA: Diagnosis not present

## 2021-12-09 DIAGNOSIS — Z87891 Personal history of nicotine dependence: Secondary | ICD-10-CM | POA: Diagnosis not present

## 2021-12-09 DIAGNOSIS — Z8542 Personal history of malignant neoplasm of other parts of uterus: Secondary | ICD-10-CM | POA: Diagnosis not present

## 2021-12-09 DIAGNOSIS — R3589 Other polyuria: Secondary | ICD-10-CM | POA: Diagnosis not present

## 2021-12-09 DIAGNOSIS — J849 Interstitial pulmonary disease, unspecified: Secondary | ICD-10-CM | POA: Diagnosis not present

## 2021-12-09 DIAGNOSIS — Z6839 Body mass index (BMI) 39.0-39.9, adult: Secondary | ICD-10-CM | POA: Diagnosis not present

## 2021-12-09 DIAGNOSIS — G4733 Obstructive sleep apnea (adult) (pediatric): Secondary | ICD-10-CM | POA: Diagnosis not present

## 2021-12-09 DIAGNOSIS — Z9049 Acquired absence of other specified parts of digestive tract: Secondary | ICD-10-CM | POA: Diagnosis not present

## 2021-12-09 DIAGNOSIS — Z7901 Long term (current) use of anticoagulants: Secondary | ICD-10-CM | POA: Diagnosis not present

## 2021-12-09 DIAGNOSIS — R7303 Prediabetes: Secondary | ICD-10-CM | POA: Diagnosis not present

## 2021-12-09 DIAGNOSIS — I5022 Chronic systolic (congestive) heart failure: Secondary | ICD-10-CM | POA: Diagnosis not present

## 2021-12-09 DIAGNOSIS — F341 Dysthymic disorder: Secondary | ICD-10-CM | POA: Diagnosis not present

## 2021-12-09 DIAGNOSIS — I130 Hypertensive heart and chronic kidney disease with heart failure and stage 1 through stage 4 chronic kidney disease, or unspecified chronic kidney disease: Secondary | ICD-10-CM | POA: Diagnosis not present

## 2021-12-11 ENCOUNTER — Ambulatory Visit (INDEPENDENT_AMBULATORY_CARE_PROVIDER_SITE_OTHER): Payer: Medicare Other | Admitting: Internal Medicine

## 2021-12-11 ENCOUNTER — Encounter: Payer: Self-pay | Admitting: Internal Medicine

## 2021-12-11 ENCOUNTER — Ambulatory Visit (INDEPENDENT_AMBULATORY_CARE_PROVIDER_SITE_OTHER): Payer: Medicare Other

## 2021-12-11 VITALS — BP 130/78 | HR 105 | Temp 97.8°F | Ht 60.0 in | Wt 219.0 lb

## 2021-12-11 DIAGNOSIS — R059 Cough, unspecified: Secondary | ICD-10-CM

## 2021-12-11 DIAGNOSIS — R739 Hyperglycemia, unspecified: Secondary | ICD-10-CM

## 2021-12-11 DIAGNOSIS — I509 Heart failure, unspecified: Secondary | ICD-10-CM

## 2021-12-11 DIAGNOSIS — I4819 Other persistent atrial fibrillation: Secondary | ICD-10-CM

## 2021-12-11 DIAGNOSIS — N390 Urinary tract infection, site not specified: Secondary | ICD-10-CM | POA: Diagnosis not present

## 2021-12-11 DIAGNOSIS — N1831 Chronic kidney disease, stage 3a: Secondary | ICD-10-CM

## 2021-12-11 DIAGNOSIS — I10 Essential (primary) hypertension: Secondary | ICD-10-CM | POA: Diagnosis not present

## 2021-12-11 DIAGNOSIS — J811 Chronic pulmonary edema: Secondary | ICD-10-CM

## 2021-12-11 DIAGNOSIS — R918 Other nonspecific abnormal finding of lung field: Secondary | ICD-10-CM | POA: Diagnosis not present

## 2021-12-11 DIAGNOSIS — B9629 Other Escherichia coli [E. coli] as the cause of diseases classified elsewhere: Secondary | ICD-10-CM | POA: Insufficient documentation

## 2021-12-11 DIAGNOSIS — R0989 Other specified symptoms and signs involving the circulatory and respiratory systems: Secondary | ICD-10-CM | POA: Diagnosis not present

## 2021-12-11 LAB — CBC WITH DIFFERENTIAL/PLATELET
Basophils Absolute: 0.1 10*3/uL (ref 0.0–0.1)
Basophils Relative: 0.8 % (ref 0.0–3.0)
Eosinophils Absolute: 0.2 10*3/uL (ref 0.0–0.7)
Eosinophils Relative: 2.6 % (ref 0.0–5.0)
HCT: 38.8 % (ref 36.0–46.0)
Hemoglobin: 13.3 g/dL (ref 12.0–15.0)
Lymphocytes Relative: 32.7 % (ref 12.0–46.0)
Lymphs Abs: 2 10*3/uL (ref 0.7–4.0)
MCHC: 34.2 g/dL (ref 30.0–36.0)
MCV: 96 fl (ref 78.0–100.0)
Monocytes Absolute: 0.5 10*3/uL (ref 0.1–1.0)
Monocytes Relative: 7.9 % (ref 3.0–12.0)
Neutro Abs: 3.4 10*3/uL (ref 1.4–7.7)
Neutrophils Relative %: 56 % (ref 43.0–77.0)
Platelets: 216 10*3/uL (ref 150.0–400.0)
RBC: 4.04 Mil/uL (ref 3.87–5.11)
RDW: 12.8 % (ref 11.5–15.5)
WBC: 6 10*3/uL (ref 4.0–10.5)

## 2021-12-11 LAB — URINALYSIS, ROUTINE W REFLEX MICROSCOPIC
Bilirubin Urine: NEGATIVE
Hgb urine dipstick: NEGATIVE
Ketones, ur: NEGATIVE
Nitrite: NEGATIVE
RBC / HPF: NONE SEEN (ref 0–?)
Specific Gravity, Urine: 1.015 (ref 1.000–1.030)
Total Protein, Urine: NEGATIVE
Urine Glucose: NEGATIVE
Urobilinogen, UA: 0.2 (ref 0.0–1.0)
pH: 5.5 (ref 5.0–8.0)

## 2021-12-11 LAB — HEPATIC FUNCTION PANEL
ALT: 10 U/L (ref 0–35)
AST: 20 U/L (ref 0–37)
Albumin: 3.9 g/dL (ref 3.5–5.2)
Alkaline Phosphatase: 50 U/L (ref 39–117)
Bilirubin, Direct: 0.2 mg/dL (ref 0.0–0.3)
Total Bilirubin: 0.6 mg/dL (ref 0.2–1.2)
Total Protein: 7.5 g/dL (ref 6.0–8.3)

## 2021-12-11 LAB — BRAIN NATRIURETIC PEPTIDE: Pro B Natriuretic peptide (BNP): 143 pg/mL — ABNORMAL HIGH (ref 0.0–100.0)

## 2021-12-11 LAB — BASIC METABOLIC PANEL
BUN: 35 mg/dL — ABNORMAL HIGH (ref 6–23)
CO2: 32 mEq/L (ref 19–32)
Calcium: 9.8 mg/dL (ref 8.4–10.5)
Chloride: 96 mEq/L (ref 96–112)
Creatinine, Ser: 2.12 mg/dL — ABNORMAL HIGH (ref 0.40–1.20)
GFR: 21.52 mL/min — ABNORMAL LOW (ref >60.00)
Glucose, Bld: 120 mg/dL — ABNORMAL HIGH (ref 70–99)
Potassium: 4 mEq/L (ref 3.5–5.1)
Sodium: 139 mEq/L (ref 135–145)

## 2021-12-11 NOTE — Patient Instructions (Addendum)
Please check whether you are taking the losartan and lasix at home or not  Ok to continue as you are doing for now  Please continue all other medications as before, and refills have been done if requested.  Please have the pharmacy call with any other refills you may need.  Please keep your appointments with your specialists as you may have planned  Please go to the XRAY Department in the first floor for the x-ray testing  Please go to the LAB at the blood drawing area for the tests to be done  You will be contacted by phone if any changes need to be made immediately.  Otherwise, you will receive a letter about your results with an explanation, but please check with MyChart first.  Please remember to sign up for MyChart if you have not done so, as this will be important to you in the future with finding out test results, communicating by private email, and scheduling acute appointments online when needed.  Please make an Appointment to return in 2 weeks, or sooner if needed

## 2021-12-11 NOTE — Progress Notes (Unsigned)
Patient ID: Kathy [name], female   DOB: [birth year], 81 y.o.   MRN: [id number]        Chief Complaint: follow up recent hospn 6/6 - 6/9 with UTI, weakness, afib, htn, and low volume       HPI:  Kathy [name] is a 81 y.o. female here with above with daughter in town from [place]; overall doing ok, Denies urinary symptoms such as dysuria, frequency, urgency, flank pain, hematuria or n/v, fever, chills.  Pt denies chest pain, increased sob or doe, wheezing, orthopnea, PND, increased LE swelling, palpitations, dizziness or syncope.   Pt denies polydipsia, polyuria, or new focal neuro s/s.    Pt denies fever, wt loss, night sweats, loss of appetite, or other constitutional symptoms  Pt and daughter asks for f/u urine cx.  Pt remains overall weak but ambulates in the home with walker.  No falls or confusion.  Note cardizem increased for better HR control, and losartan held with mild low volume and mild AKI, and pt was instructed to take lasix qod post hospital until here today.  Unfortunately pt did not understand this and believes she has been taking the lasix and losartan both daily as prior to hospitalization.  No other new complaints except has scant prod cough cough  Has finished oral antibx.  Also plans to f/u pulm as planned       Wt Readings from Last 3 Encounters:  12/11/21 219 lb (99.3 kg)  12/03/21 231 lb 7.7 oz (105 kg)  09/22/21 226 lb (102.5 kg)   BP Readings from Last 3 Encounters:  12/11/21 130/78  12/04/21 (!) 137/110  09/22/21 138/80         Past Medical History:  Diagnosis Date   Arthritis    fingers   CKD (chronic kidney disease) stage 3, GFR 30-59 ml/min (HCC) 11/30/2017   Depression    Dizziness    in AM, getting out of bed   Dysrhythmia    "skips a beat" sometimes - followed by PCP   Endometrial ca ([place]) 11/30/2017   S/p surgury 1990's   GERD (gastroesophageal reflux disease) 11/30/2017   HLD (hyperlipidemia) 11/30/2017   Hypercholesteremia    Hypertension     Interstitial lung disease ([place])    Neuropathy    bilateral feet   Shortness of breath dyspnea    Sleep apnea    has CPAP, doesn't use   Umbilical hernia    Past Surgical History:  Procedure Laterality Date   ABDOMINAL HYSTERECTOMY     BROW LIFT Bilateral 07/15/2015   Procedure: BLEPHAROPLASTY;  Surgeon: Karle Starch, MD;  Location: [facility];  Service: Ophthalmology;  Laterality: Bilateral;   CHOLECYSTECTOMY     HAMMER TOE SURGERY     HERNIA REPAIR     KNEE ARTHROSCOPY Bilateral    PTOSIS REPAIR Bilateral 07/15/2015   Procedure: PTOSIS REPAIR;  Surgeon: Karle Starch, MD;  Location: [facility];  Service: Ophthalmology;  Laterality: Bilateral;  CPAP   TONSILLECTOMY      reports that she quit smoking about 35 years ago. Her smoking use included cigarettes. She has a 10.00 pack-year smoking history. She has never used smokeless tobacco. She reports that she does not drink alcohol and does not use drugs. family history includes Congestive Heart Failure in her mother; Diabetes in her son; Parkinson's disease in her father; Stroke in her father. Allergies  Allergen Reactions   Lipitor [Atorvastatin] Other (See Comments)    Memory  issues   Requip [Ropinirole Hcl] Other (See Comments)    Pt reports feeling generally unwell on this medication   Current Outpatient Medications on File Prior to Visit  Medication Sig Dispense Refill   albuterol (VENTOLIN HFA) 108 (90 Base) MCG/ACT inhaler Inhale 1-2 puffs into the lungs every 6 (six) hours as needed for wheezing or shortness of breath. 1 each 11   amitriptyline (ELAVIL) 100 MG tablet TAKE 1/2 - 1 TABLET BY MOUTH AT BEDTIME FOR SLEEP AND FEET PAIN (Patient taking differently: Take 50-100 mg by mouth at bedtime.) 90 tablet 1   apixaban (ELIQUIS) 5 MG TABS tablet Take 1 tablet (5 mg total) by mouth 2 (two) times daily. 180 tablet 3   Cholecalciferol (VITAMIN D-3) 25 MCG (1000 UT) CAPS Take 1 capsule by mouth daily.      cyanocobalamin (,VITAMIN B-12,) 1000 MCG/ML injection INJECT 1 ML IM EVERY 30 DAYS (Patient taking differently: Inject 1,000 mcg into the muscle every 30 (thirty) days.) 3 mL 1   diltiazem (CARDIZEM) 120 MG tablet Take 120 mg by mouth daily.     fenofibrate micronized (LOFIBRA) 134 MG capsule TAKE 1 CAPSULE BY MOUTH EVERY DAY IN THE MORNING BEFORE BREAKFAST (Patient taking differently: Take 134 mg by mouth daily before breakfast.) 90 capsule 3   losartan (COZAAR) 100 MG tablet Take 100 mg by mouth daily.     meloxicam (MOBIC) 15 MG tablet Take 15 mg by mouth daily.     metoprolol succinate (TOPROL-XL) 50 MG 24 hr tablet Take 1 tablet (50 mg total) by mouth daily. Take with or immediately following a meal. (Patient taking differently: Take 50 mg by mouth daily.) 90 tablet 3   pantoprazole (PROTONIX) 40 MG tablet Take 1 tablet (40 mg total) by mouth daily. 90 tablet 3   Respiratory Therapy Supplies (FLUTTER) DEVI Use as directed 1 each 0   furosemide (LASIX) 40 MG tablet Take 1 tablet (40 mg total) by mouth every other day. 1 tab by mouth in the AM, and 1 tab by mouth in the PM as needed for persistent swelling or weight gain more than 3-5 lbs (Patient not taking: Reported on 12/11/2021) 60 tablet 11   No current facility-administered medications on file prior to visit.        ROS:  All others reviewed and negative.  Objective        PE:  BP 130/78 (BP Location: Left Arm, Patient Position: Sitting, Cuff Size: Large)   Pulse (!) 105   Temp 97.8 F (36.6 C) (Oral)   Ht 5' (1.524 m)   Wt 219 lb (99.3 kg)   SpO2 95%   BMI 42.77 kg/m                 Constitutional: Pt appears in NAD               HENT: Head: NCAT.                Right Ear: External ear normal.                 Left Ear: External ear normal.                Eyes: . Pupils are equal, round, and reactive to light. Conjunctivae and EOM are normal               Nose: without d/c or deformity  Neck: Neck supple. Gross  normal ROM               Cardiovascular: Normal rate and regular rhythm.                 Pulmonary/Chest: Effort normal and breath sounds without rales or wheezing.                Abd:  Soft, NT, ND, + BS, no organomegaly               Neurological: Pt is alert. At baseline orientation, motor grossly intact               Skin: Skin is warm. No rashes, no other new lesions, LE edema - none               Psychiatric: Pt behavior is normal without agitation   Micro: none  Cardiac tracings I have personally interpreted today:  none  Pertinent Radiological findings (summarize): none   Lab Results  Component Value Date   WBC 6.0 12/11/2021   HGB 13.3 12/11/2021   HCT 38.8 12/11/2021   PLT 216.0 12/11/2021   GLUCOSE 120 (H) 12/11/2021   CHOL 152 09/22/2021   TRIG 92.0 09/22/2021   HDL 56.30 09/22/2021   LDLCALC 77 09/22/2021   ALT 10 12/11/2021   AST 20 12/11/2021   NA 139 12/11/2021   K 4.0 12/11/2021   CL 96 12/11/2021   CREATININE 2.12 (H) 12/11/2021   BUN 35 (H) 12/11/2021   CO2 32 12/11/2021   TSH 2.44 09/22/2021   HGBA1C 5.8 09/22/2021   Assessment/Plan:  Kathy [name] is a 81 y.o. White or Caucasian [1] female with  has a past medical history of Arthritis, CKD (chronic kidney disease) stage 3, GFR 30-59 ml/min (HCC) (11/30/2017), Depression, Dizziness, Dysrhythmia, Endometrial ca ([place]) (11/30/2017), GERD (gastroesophageal reflux disease) (11/30/2017), HLD (hyperlipidemia) (11/30/2017), Hypercholesteremia, Hypertension, Interstitial lung disease ([place]), Neuropathy, Shortness of breath dyspnea, Sleep apnea, and Umbilical hernia.  Essential hypertension  Currently stable, cont all current tx which likely includes losartan 50 qd it seems, pt to check at home  CKD (chronic kidney disease) stage 3, GFR 30-59 ml/min (HCC) With mild aki on ckd, for f/u lab today, pt has likely been taking qd lasix instead of qod, and I suspect may still be on the mild low volume side  Cough Noted on exam  today though pt does not seem overly concerned, for f/u cxr  UTI (urinary tract infection) Asympt, for f/u urine cx as well  Atrial fibrillation (HCC) O/w good rate control and no evidence for volume increase today,  to f/u any worsening symptoms or concerns  Followup: Return in about 2 weeks (around 12/25/2021).  Cathlean Cower, MD 12/13/2021 6:27 PM [facility]

## 2021-12-12 ENCOUNTER — Other Ambulatory Visit: Payer: Self-pay | Admitting: Internal Medicine

## 2021-12-12 MED ORDER — AZITHROMYCIN 250 MG PO TABS: ORAL_TABLET | ORAL | 1 refills | Status: AC

## 2021-12-13 LAB — URINE CULTURE

## 2021-12-13 NOTE — Assessment & Plan Note (Signed)
Asympt, for f/u urine cx as well

## 2021-12-13 NOTE — Assessment & Plan Note (Signed)
O/w good rate control and no evidence for volume increase today,  to f/u any worsening symptoms or concerns

## 2021-12-13 NOTE — Assessment & Plan Note (Signed)
With mild aki on ckd, for f/u lab today, pt has likely been taking qd lasix instead of qod, and I suspect may still be on the mild low volume side

## 2021-12-13 NOTE — Assessment & Plan Note (Signed)
Noted on exam today though pt does not seem overly concerned, for f/u cxr

## 2021-12-13 NOTE — Assessment & Plan Note (Signed)
Currently stable, cont all current tx which likely includes losartan 50 qd it seems, pt to check at home

## 2021-12-14 ENCOUNTER — Other Ambulatory Visit: Payer: Self-pay | Admitting: Internal Medicine

## 2021-12-14 DIAGNOSIS — M3501 Sicca syndrome with keratoconjunctivitis: Secondary | ICD-10-CM | POA: Diagnosis not present

## 2021-12-14 DIAGNOSIS — H2511 Age-related nuclear cataract, right eye: Secondary | ICD-10-CM | POA: Diagnosis not present

## 2021-12-14 DIAGNOSIS — J189 Pneumonia, unspecified organism: Secondary | ICD-10-CM

## 2021-12-14 HISTORY — DX: Pneumonia, unspecified organism: J18.9

## 2021-12-14 MED ORDER — NITROFURANTOIN MONOHYD MACRO 100 MG PO CAPS: 100.0000 mg | ORAL_CAPSULE | Freq: Two times a day (BID) | ORAL | 0 refills | Status: DC

## 2021-12-15 ENCOUNTER — Telehealth: Payer: Self-pay | Admitting: Internal Medicine

## 2021-12-15 DIAGNOSIS — N183 Chronic kidney disease, stage 3 unspecified: Secondary | ICD-10-CM | POA: Diagnosis not present

## 2021-12-15 DIAGNOSIS — I4821 Permanent atrial fibrillation: Secondary | ICD-10-CM | POA: Diagnosis not present

## 2021-12-15 DIAGNOSIS — I130 Hypertensive heart and chronic kidney disease with heart failure and stage 1 through stage 4 chronic kidney disease, or unspecified chronic kidney disease: Secondary | ICD-10-CM | POA: Diagnosis not present

## 2021-12-15 DIAGNOSIS — I5022 Chronic systolic (congestive) heart failure: Secondary | ICD-10-CM | POA: Diagnosis not present

## 2021-12-15 DIAGNOSIS — R3589 Other polyuria: Secondary | ICD-10-CM | POA: Diagnosis not present

## 2021-12-15 DIAGNOSIS — G9341 Metabolic encephalopathy: Secondary | ICD-10-CM | POA: Diagnosis not present

## 2021-12-15 NOTE — Telephone Encounter (Signed)
Ok for verbals 

## 2021-12-15 NOTE — Telephone Encounter (Signed)
Verbals given  

## 2021-12-15 NOTE — Telephone Encounter (Signed)
Physical therapist said they have done the [place] evaluation and are requesting orders for physical therapy twice a week for one week. Once a week for four weeks. And then every other week for two weeks.   [phone] is ok to leave a message.

## 2021-12-16 ENCOUNTER — Telehealth: Payer: Self-pay | Admitting: Internal Medicine

## 2021-12-16 DIAGNOSIS — I5022 Chronic systolic (congestive) heart failure: Secondary | ICD-10-CM | POA: Diagnosis not present

## 2021-12-16 DIAGNOSIS — I130 Hypertensive heart and chronic kidney disease with heart failure and stage 1 through stage 4 chronic kidney disease, or unspecified chronic kidney disease: Secondary | ICD-10-CM | POA: Diagnosis not present

## 2021-12-16 DIAGNOSIS — G9341 Metabolic encephalopathy: Secondary | ICD-10-CM | POA: Diagnosis not present

## 2021-12-16 DIAGNOSIS — N183 Chronic kidney disease, stage 3 unspecified: Secondary | ICD-10-CM | POA: Diagnosis not present

## 2021-12-16 DIAGNOSIS — R3589 Other polyuria: Secondary | ICD-10-CM | POA: Diagnosis not present

## 2021-12-16 DIAGNOSIS — I4821 Permanent atrial fibrillation: Secondary | ICD-10-CM | POA: Diagnosis not present

## 2021-12-16 NOTE — Telephone Encounter (Signed)
Discussed losartan medication and that patient should not be taking. Medication was not refill at 12/11/21 visit. Patient states that she is not taking

## 2021-12-16 NOTE — Telephone Encounter (Signed)
Agree - we can hold on taking the losartan for now, thanks

## 2021-12-16 NOTE — Telephone Encounter (Signed)
Pt called to ask about her losartan (COZAAR) 100 MG tablet RX, the hospital told her to stop taking it on 12-01-21, Dr. Jenny Reichmann refilled it on 6-16,   Pt reports having cut the pills in half before, taking only 50 mg a day.   Pt is not sure if she should continue taking the medication or not.   Please call pt and advise on what to do.   Stanton Kidney: [phone]

## 2021-12-17 DIAGNOSIS — R3589 Other polyuria: Secondary | ICD-10-CM | POA: Diagnosis not present

## 2021-12-17 DIAGNOSIS — N183 Chronic kidney disease, stage 3 unspecified: Secondary | ICD-10-CM | POA: Diagnosis not present

## 2021-12-17 DIAGNOSIS — I130 Hypertensive heart and chronic kidney disease with heart failure and stage 1 through stage 4 chronic kidney disease, or unspecified chronic kidney disease: Secondary | ICD-10-CM | POA: Diagnosis not present

## 2021-12-17 DIAGNOSIS — I4821 Permanent atrial fibrillation: Secondary | ICD-10-CM | POA: Diagnosis not present

## 2021-12-17 DIAGNOSIS — I5022 Chronic systolic (congestive) heart failure: Secondary | ICD-10-CM | POA: Diagnosis not present

## 2021-12-17 DIAGNOSIS — G9341 Metabolic encephalopathy: Secondary | ICD-10-CM | POA: Diagnosis not present

## 2021-12-21 ENCOUNTER — Encounter: Payer: Self-pay | Admitting: Ophthalmology

## 2021-12-21 DIAGNOSIS — H2511 Age-related nuclear cataract, right eye: Secondary | ICD-10-CM | POA: Diagnosis not present

## 2021-12-21 NOTE — Anesthesia Preprocedure Evaluation (Addendum)
Anesthesia Evaluation  Patient identified by MRN, date of birth, ID band Patient awake    Reviewed: Allergy & Precautions, NPO status , Patient's Chart, lab work & pertinent test results, reviewed documented beta blocker date and time   History of Anesthesia Complications (+) DIFFICULT IV STICK / SPECIAL LINE and history of anesthetic complications  Airway Mallampati: III  TM Distance: >3 FB Neck ROM: Limited    Dental   Pulmonary shortness of breath and Long-Term Oxygen Therapy, sleep apnea , former smoker,   Interstitial lung disease   breath sounds clear to auscultation       Cardiovascular Exercise Tolerance: Poor hypertension, (-) angina+CHF and + DOE  + dysrhythmias (on Eliquis) Atrial Fibrillation  Rhythm:Regular Rate:Normal   HLD  TTE (01/2020): LVEF 45-50%, global LV hypokinesis PASP mildly elevated, 36.1 mmHg LA mildly dilated   Neuro/Psych PSYCHIATRIC DISORDERS Depression  Neuromuscular disease (Peripheral neuropathy)    GI/Hepatic GERD  ,  Endo/Other    Renal/GU CRFRenal disease     Musculoskeletal  (+) Arthritis , Osteoarthritis,    Abdominal (+) + obese (BMI 43),   Peds  Hematology   Anesthesia Other Findings Endometrial cancer  Reproductive/Obstetrics                           Anesthesia Physical Anesthesia Plan  ASA: 4  Anesthesia Plan: MAC   Post-op Pain Management:    Induction: Intravenous  PONV Risk Score and Plan: 2 and TIVA, Midazolam and Treatment may vary due to age or medical condition  Airway Management Planned: Nasal Cannula  Additional Equipment:   Intra-op Plan:   Post-operative Plan:   Informed Consent: I have reviewed the patients History and Physical, chart, labs and discussed the procedure including the risks, benefits and alternatives for the proposed anesthesia with the patient or authorized representative who has indicated his/her  understanding and acceptance.       Plan Discussed with: CRNA and Anesthesiologist  Anesthesia Plan Comments:        Anesthesia Quick Evaluation

## 2021-12-22 DIAGNOSIS — N183 Chronic kidney disease, stage 3 unspecified: Secondary | ICD-10-CM | POA: Diagnosis not present

## 2021-12-22 DIAGNOSIS — R3589 Other polyuria: Secondary | ICD-10-CM | POA: Diagnosis not present

## 2021-12-22 DIAGNOSIS — I130 Hypertensive heart and chronic kidney disease with heart failure and stage 1 through stage 4 chronic kidney disease, or unspecified chronic kidney disease: Secondary | ICD-10-CM | POA: Diagnosis not present

## 2021-12-22 DIAGNOSIS — G9341 Metabolic encephalopathy: Secondary | ICD-10-CM | POA: Diagnosis not present

## 2021-12-22 DIAGNOSIS — I5022 Chronic systolic (congestive) heart failure: Secondary | ICD-10-CM | POA: Diagnosis not present

## 2021-12-22 DIAGNOSIS — I4821 Permanent atrial fibrillation: Secondary | ICD-10-CM | POA: Diagnosis not present

## 2021-12-24 NOTE — Discharge Instructions (Signed)

## 2021-12-25 ENCOUNTER — Ambulatory Visit (INDEPENDENT_AMBULATORY_CARE_PROVIDER_SITE_OTHER): Payer: Medicare Other | Admitting: Internal Medicine

## 2021-12-25 ENCOUNTER — Encounter: Payer: Self-pay | Admitting: Internal Medicine

## 2021-12-25 VITALS — BP 118/72 | HR 62 | Temp 97.9°F | Ht 60.0 in | Wt 224.0 lb

## 2021-12-25 DIAGNOSIS — F32A Depression, unspecified: Secondary | ICD-10-CM

## 2021-12-25 DIAGNOSIS — R739 Hyperglycemia, unspecified: Secondary | ICD-10-CM

## 2021-12-25 DIAGNOSIS — E559 Vitamin D deficiency, unspecified: Secondary | ICD-10-CM

## 2021-12-25 DIAGNOSIS — N1831 Chronic kidney disease, stage 3a: Secondary | ICD-10-CM

## 2021-12-25 NOTE — Patient Instructions (Signed)
Please continue all other medications as before, and refills have been done if requested.  Please have the pharmacy call with any other refills you may need.  Please continue your efforts at being more active, low cholesterol diet, and weight control.  Please keep your appointments with your specialists as you may have planned  Please make an Appointment to return in 6 months, or sooner if needed 

## 2021-12-25 NOTE — Progress Notes (Unsigned)
Patient ID: Kathy [name], female   DOB: 12-Feb-1941, 81 y.o.   MRN: [id number]        Chief Complaint: follow up UTI, depression, low vit d, hyperglycemia, htn, ckd       HPI:  Kathy [name] is a 81 y.o. female here with husband, overall doing ok;  Pt denies chest pain, increased sob or doe, wheezing, orthopnea, PND, increased LE swelling, palpitations, dizziness or syncope.   Pt denies polydipsia, polyuria, or new focal neuro s/s.   Denies urinary symptoms such as dysuria, frequency, urgency, flank pain, hematuria or n/v, fever, chills. Has tolerated antibiotic well.  Taking Vit D.  Has had mild worsening depression recently with low mood and anhedonia, but no SI or HI and declines tx at this time.         Wt Readings from Last 3 Encounters:  12/25/21 224 lb (101.6 kg)  12/11/21 219 lb (99.3 kg)  12/03/21 231 lb 7.7 oz (105 kg)   BP Readings from Last 3 Encounters:  12/25/21 118/72  12/11/21 130/78  12/04/21 (!) 137/110         Past Medical History:  Diagnosis Date   Arthritis    fingers   CKD (chronic kidney disease) stage 3, GFR 30-59 ml/min (HCC) 11/30/2017   Depression    Dizziness    in AM, getting out of bed   Dysrhythmia    A fib   Endometrial ca ([place]) 11/30/2017   S/p surgury 1990's   GERD (gastroesophageal reflux disease) 11/30/2017   HLD (hyperlipidemia) 11/30/2017   Hypercholesteremia    Hypertension    Interstitial lung disease ([place])    Neuropathy    bilateral feet   Pneumonia 12/14/2021   Shortness of breath dyspnea    Sleep apnea    has CPAP, doesn't use   Umbilical hernia    Past Surgical History:  Procedure Laterality Date   ABDOMINAL HYSTERECTOMY     BROW LIFT Bilateral 07/15/2015   Procedure: BLEPHAROPLASTY;  Surgeon: Karle Starch, MD;  Location: [facility];  Service: Ophthalmology;  Laterality: Bilateral;   CHOLECYSTECTOMY     HAMMER TOE SURGERY     HERNIA REPAIR     KNEE ARTHROSCOPY Bilateral    PTOSIS REPAIR Bilateral 07/15/2015   Procedure:  PTOSIS REPAIR;  Surgeon: Karle Starch, MD;  Location: [facility];  Service: Ophthalmology;  Laterality: Bilateral;  CPAP   TONSILLECTOMY      reports that she quit smoking about 35 years ago. Her smoking use included cigarettes. She has a 10.00 pack-year smoking history. She has never used smokeless tobacco. She reports that she does not drink alcohol and does not use drugs. family history includes Congestive Heart Failure in her mother; Diabetes in her son; Parkinson's disease in her father; Stroke in her father. Allergies  Allergen Reactions   Lipitor [Atorvastatin] Other (See Comments)    Memory issues   Requip [Ropinirole Hcl] Other (See Comments)    Pt reports feeling generally unwell on this medication   Current Outpatient Medications on File Prior to Visit  Medication Sig Dispense Refill   albuterol (VENTOLIN HFA) 108 (90 Base) MCG/ACT inhaler Inhale 1-2 puffs into the lungs every 6 (six) hours as needed for wheezing or shortness of breath. 1 each 11   amitriptyline (ELAVIL) 100 MG tablet TAKE 1/2 - 1 TABLET BY MOUTH AT BEDTIME FOR SLEEP AND FEET PAIN (Patient taking differently: Take 50-100 mg by mouth at bedtime.) 90 tablet 1  apixaban (ELIQUIS) 5 MG TABS tablet Take 1 tablet (5 mg total) by mouth 2 (two) times daily. 180 tablet 3   Cholecalciferol (VITAMIN D-3) 25 MCG (1000 UT) CAPS Take 1 capsule by mouth daily.     cyanocobalamin (,VITAMIN B-12,) 1000 MCG/ML injection INJECT 1 ML IM EVERY 30 DAYS (Patient taking differently: Inject 1,000 mcg into the muscle every 30 (thirty) days.) 3 mL 1   diltiazem (CARDIZEM) 120 MG tablet Take 120 mg by mouth daily.     fenofibrate micronized (LOFIBRA) 134 MG capsule TAKE 1 CAPSULE BY MOUTH EVERY DAY IN THE MORNING BEFORE BREAKFAST (Patient taking differently: Take 134 mg by mouth daily before breakfast.) 90 capsule 3   furosemide (LASIX) 40 MG tablet Take 1 tablet (40 mg total) by mouth every other day. 1 tab by mouth in the AM, and 1  tab by mouth in the PM as needed for persistent swelling or weight gain more than 3-5 lbs 60 tablet 11   meloxicam (MOBIC) 15 MG tablet Take 15 mg by mouth daily.     metoprolol succinate (TOPROL-XL) 50 MG 24 hr tablet Take 1 tablet (50 mg total) by mouth daily. Take with or immediately following a meal. (Patient taking differently: Take 50 mg by mouth daily.) 90 tablet 3   pantoprazole (PROTONIX) 40 MG tablet Take 1 tablet (40 mg total) by mouth daily. 90 tablet 3   Respiratory Therapy Supplies (FLUTTER) DEVI Use as directed 1 each 0   losartan (COZAAR) 100 MG tablet Take 100 mg by mouth daily. (Patient not taking: Reported on 12/25/2021)     No current facility-administered medications on file prior to visit.        ROS:  All others reviewed and negative.  Objective        PE:  BP 118/72 (BP Location: Right Arm, Patient Position: Sitting, Cuff Size: Large)   Pulse 62   Temp 97.9 F (36.6 C) (Oral)   Ht 5' (1.524 m)   Wt 224 lb (101.6 kg)   SpO2 98%   BMI 43.75 kg/m                 Constitutional: Pt appears in NAD               HENT: Head: NCAT.                Right Ear: External ear normal.                 Left Ear: External ear normal.                Eyes: . Pupils are equal, round, and reactive to light. Conjunctivae and EOM are normal               Nose: without d/c or deformity               Neck: Neck supple. Gross normal ROM               Cardiovascular: Normal rate and regular rhythm.                 Pulmonary/Chest: Effort normal and breath sounds without rales or wheezing.                Abd:  Soft, NT, ND, + BS, no organomegaly               Neurological: Pt is alert. At baseline orientation, motor grossly intact  Skin: Skin is warm. No rashes, no other new lesions, LE edema - trace bilateral               Psychiatric: Pt behavior is normal without agitation , depressed affect  Micro: none  Cardiac tracings I have personally interpreted today:   none  Pertinent Radiological findings (summarize): none   Lab Results  Component Value Date   WBC 6.0 12/11/2021   HGB 13.3 12/11/2021   HCT 38.8 12/11/2021   PLT 216.0 12/11/2021   GLUCOSE 120 (H) 12/11/2021   CHOL 152 09/22/2021   TRIG 92.0 09/22/2021   HDL 56.30 09/22/2021   LDLCALC 77 09/22/2021   ALT 10 12/11/2021   AST 20 12/11/2021   NA 139 12/11/2021   K 4.0 12/11/2021   CL 96 12/11/2021   CREATININE 2.12 (H) 12/11/2021   BUN 35 (H) 12/11/2021   CO2 32 12/11/2021   TSH 2.44 09/22/2021   HGBA1C 5.8 09/22/2021   Assessment/Plan:  Kathy [name] is a 81 y.o. White or Caucasian [1] female with  has a past medical history of Arthritis, CKD (chronic kidney disease) stage 3, GFR 30-59 ml/min (HCC) (11/30/2017), Depression, Dizziness, Dysrhythmia, Endometrial ca (HCC) (11/30/2017), GERD (gastroesophageal reflux disease) (11/30/2017), HLD (hyperlipidemia) (11/30/2017), Hypercholesteremia, Hypertension, Interstitial lung disease ([place]), Neuropathy, Pneumonia (12/14/2021), Shortness of breath dyspnea, Sleep apnea, and Umbilical hernia.  Vitamin D deficiency Last vitamin D Lab Results  Component Value Date   VD25OH 42.31 09/22/2021   Stable, cont oral replacement  Hyperglycemia Lab Results  Component Value Date   HGBA1C 5.8 09/22/2021   Stable, pt to continue current medical treatment  - diet, wt control, activity   Depression Uncontrolled with mild recent worsening, likely situational, declines further tx for now such as SSRI or referral  CKD (chronic kidney disease) stage 3, GFR 30-59 ml/min (HCC) Lab Results  Component Value Date   CREATININE 2.12 (H) 12/11/2021   Stable overall, cont to avoid nephrotoxins  Followup: Return in about 6 months (around 06/26/2022).  Cathlean Cower, MD 12/27/2021 4:06 PM [facility]

## 2021-12-27 ENCOUNTER — Encounter: Payer: Self-pay | Admitting: Internal Medicine

## 2021-12-27 NOTE — Assessment & Plan Note (Signed)
Lab Results  Component Value Date   HGBA1C 5.8 09/22/2021   Stable, pt to continue current medical treatment  - diet, wt control, activity

## 2021-12-27 NOTE — Assessment & Plan Note (Signed)
Uncontrolled with mild recent worsening, likely situational, declines further tx for now such as SSRI or referral

## 2021-12-27 NOTE — Assessment & Plan Note (Signed)
Last vitamin D Lab Results  Component Value Date   VD25OH 42.31 09/22/2021   Stable, cont oral replacement  

## 2021-12-27 NOTE — Assessment & Plan Note (Signed)
Lab Results  Component Value Date   CREATININE 2.12 (H) 12/11/2021   Stable overall, cont to avoid nephrotoxins  

## 2021-12-31 ENCOUNTER — Encounter
Admission: RE | Disposition: A | Discharge status: Home / Self Care | Payer: Self-pay | Source: Home / Self Care | Attending: Ophthalmology

## 2021-12-31 ENCOUNTER — Other Ambulatory Visit: Payer: Self-pay

## 2021-12-31 ENCOUNTER — Ambulatory Visit: Payer: Medicare Other | Admitting: Anesthesiology

## 2021-12-31 ENCOUNTER — Ambulatory Visit
Admission: RE | Admit: 2021-12-31 | Discharge: 2021-12-31 | Disposition: A | Discharge status: Home / Self Care | Payer: Medicare Other | Attending: Ophthalmology | Admitting: Ophthalmology

## 2021-12-31 ENCOUNTER — Encounter: Payer: Self-pay | Admitting: Ophthalmology

## 2021-12-31 DIAGNOSIS — F32A Depression, unspecified: Secondary | ICD-10-CM | POA: Insufficient documentation

## 2021-12-31 DIAGNOSIS — Z87891 Personal history of nicotine dependence: Secondary | ICD-10-CM | POA: Insufficient documentation

## 2021-12-31 DIAGNOSIS — K219 Gastro-esophageal reflux disease without esophagitis: Secondary | ICD-10-CM | POA: Insufficient documentation

## 2021-12-31 DIAGNOSIS — Z8542 Personal history of malignant neoplasm of other parts of uterus: Secondary | ICD-10-CM | POA: Insufficient documentation

## 2021-12-31 DIAGNOSIS — M1990 Unspecified osteoarthritis, unspecified site: Secondary | ICD-10-CM | POA: Insufficient documentation

## 2021-12-31 DIAGNOSIS — R739 Hyperglycemia, unspecified: Secondary | ICD-10-CM

## 2021-12-31 DIAGNOSIS — I130 Hypertensive heart and chronic kidney disease with heart failure and stage 1 through stage 4 chronic kidney disease, or unspecified chronic kidney disease: Secondary | ICD-10-CM | POA: Insufficient documentation

## 2021-12-31 DIAGNOSIS — I509 Heart failure, unspecified: Secondary | ICD-10-CM | POA: Insufficient documentation

## 2021-12-31 DIAGNOSIS — G4730 Sleep apnea, unspecified: Secondary | ICD-10-CM | POA: Diagnosis not present

## 2021-12-31 DIAGNOSIS — R0602 Shortness of breath: Secondary | ICD-10-CM | POA: Diagnosis not present

## 2021-12-31 DIAGNOSIS — E669 Obesity, unspecified: Secondary | ICD-10-CM | POA: Insufficient documentation

## 2021-12-31 DIAGNOSIS — N183 Chronic kidney disease, stage 3 unspecified: Secondary | ICD-10-CM | POA: Insufficient documentation

## 2021-12-31 DIAGNOSIS — H2511 Age-related nuclear cataract, right eye: Secondary | ICD-10-CM | POA: Diagnosis not present

## 2021-12-31 DIAGNOSIS — I4891 Unspecified atrial fibrillation: Secondary | ICD-10-CM | POA: Insufficient documentation

## 2021-12-31 DIAGNOSIS — Z6841 Body Mass Index (BMI) 40.0 and over, adult: Secondary | ICD-10-CM | POA: Diagnosis not present

## 2021-12-31 DIAGNOSIS — Z79899 Other long term (current) drug therapy: Secondary | ICD-10-CM | POA: Insufficient documentation

## 2021-12-31 DIAGNOSIS — Z7901 Long term (current) use of anticoagulants: Secondary | ICD-10-CM | POA: Insufficient documentation

## 2021-12-31 DIAGNOSIS — E785 Hyperlipidemia, unspecified: Secondary | ICD-10-CM | POA: Diagnosis not present

## 2021-12-31 DIAGNOSIS — N189 Chronic kidney disease, unspecified: Secondary | ICD-10-CM | POA: Diagnosis not present

## 2021-12-31 HISTORY — PX: CATARACT EXTRACTION W/PHACO: SHX586

## 2021-12-31 SURGERY — PHACOEMULSIFICATION, CATARACT, WITH IOL INSERTION
Anesthesia: Monitor Anesthesia Care | Site: Eye | Laterality: Right

## 2021-12-31 MED ORDER — LIDOCAINE HCL (PF) 2 % IJ SOLN
INTRAOCULAR | Status: DC | PRN
  Administered 2021-12-31: 1 mL via INTRAOCULAR

## 2021-12-31 MED ORDER — ARMC OPHTHALMIC DILATING DROPS
1.0000 | OPHTHALMIC | Status: DC | PRN
Start: 2021-12-31 — End: 2021-12-31
  Administered 2021-12-31 (×3): 1 via OPHTHALMIC

## 2021-12-31 MED ORDER — SIGHTPATH DOSE#1 NA HYALUR & NA CHOND-NA HYALUR IO KIT
PACK | INTRAOCULAR | Status: DC | PRN
  Administered 2021-12-31: 1 via OPHTHALMIC

## 2021-12-31 MED ORDER — FENTANYL CITRATE (PF) 100 MCG/2ML IJ SOLN
INTRAMUSCULAR | Status: DC | PRN
  Administered 2021-12-31: 25 ug via INTRAVENOUS

## 2021-12-31 MED ORDER — MOXIFLOXACIN HCL 0.5 % OP SOLN
OPHTHALMIC | Status: DC | PRN
  Administered 2021-12-31: 0.2 mL via OPHTHALMIC

## 2021-12-31 MED ORDER — MIDAZOLAM HCL 2 MG/2ML IJ SOLN
INTRAMUSCULAR | Status: DC | PRN
  Administered 2021-12-31: .5 mg via INTRAVENOUS

## 2021-12-31 MED ORDER — BRIMONIDINE TARTRATE-TIMOLOL 0.2-0.5 % OP SOLN
OPHTHALMIC | Status: DC | PRN
  Administered 2021-12-31: 1 [drp] via OPHTHALMIC

## 2021-12-31 MED ORDER — SIGHTPATH DOSE#1 BSS IO SOLN
INTRAOCULAR | Status: DC | PRN
  Administered 2021-12-31: 15 mL

## 2021-12-31 MED ORDER — ONDANSETRON HCL 4 MG/2ML IJ SOLN: 4.0000 mg | Freq: Once | INTRAMUSCULAR | Status: DC | PRN

## 2021-12-31 MED ORDER — ACETAMINOPHEN 160 MG/5ML PO SOLN: 325.0000 mg | ORAL | Status: DC | PRN

## 2021-12-31 MED ORDER — METOPROLOL TARTRATE 5 MG/5ML IV SOLN
5.0000 mg | Freq: Once | INTRAVENOUS | Status: AC
Start: 2021-12-31 — End: 2021-12-31
  Administered 2021-12-31: 5 mg via INTRAVENOUS

## 2021-12-31 MED ORDER — ACETAMINOPHEN 325 MG PO TABS: 650.0000 mg | ORAL_TABLET | ORAL | Status: DC | PRN

## 2021-12-31 MED ORDER — LACTATED RINGERS IV SOLN: INTRAVENOUS | Status: DC

## 2021-12-31 MED ORDER — TETRACAINE HCL 0.5 % OP SOLN
1.0000 [drp] | OPHTHALMIC | Status: DC | PRN
Start: 2021-12-31 — End: 2021-12-31
  Administered 2021-12-31 (×3): 1 [drp] via OPHTHALMIC

## 2021-12-31 MED ORDER — SIGHTPATH DOSE#1 BSS IO SOLN
INTRAOCULAR | Status: DC | PRN
  Administered 2021-12-31: 114 mL via OPHTHALMIC

## 2021-12-31 SURGICAL SUPPLY — 23 items
CANNULA ANT/CHMB 27G (MISCELLANEOUS) IMPLANT
CANNULA ANT/CHMB 27GA (MISCELLANEOUS) IMPLANT
CATARACT SUITE SIGHTPATH (MISCELLANEOUS) ×2 IMPLANT
DISSECTOR HYDRO NUCLEUS 50X22 (MISCELLANEOUS) ×2 IMPLANT
DRSG TEGADERM 2-3/8X2-3/4 SM (GAUZE/BANDAGES/DRESSINGS) ×2 IMPLANT
FEE CATARACT SUITE SIGHTPATH (MISCELLANEOUS) ×1 IMPLANT
GLOVE SURG SYN 7.5  E (GLOVE) ×2
GLOVE SURG SYN 7.5 E (GLOVE) ×1 IMPLANT
GLOVE SURG SYN 7.5 PF PI (GLOVE) ×1 IMPLANT
GLOVE SURG SYN 8.5  E (GLOVE) ×2
GLOVE SURG SYN 8.5 E (GLOVE) ×1 IMPLANT
GLOVE SURG SYN 8.5 PF PI (GLOVE) ×1 IMPLANT
LENS IOL TECNIS EYHANCE 19.5 (Intraocular Lens) ×1 IMPLANT
NDL FILTER BLUNT 18X1 1/2 (NEEDLE) IMPLANT
NEEDLE FILTER BLUNT 18X 1/2SAF (NEEDLE)
NEEDLE FILTER BLUNT 18X1 1/2 (NEEDLE) IMPLANT
PACK VIT ANT 23G (MISCELLANEOUS) IMPLANT
RING MALYGIN (MISCELLANEOUS) IMPLANT
SUT ETHILON 10-0 CS-B-6CS-B-6 (SUTURE)
SUTURE EHLN 10-0 CS-B-6CS-B-6 (SUTURE) IMPLANT
SYR 3ML LL SCALE MARK (SYRINGE) IMPLANT
SYR 5ML LL (SYRINGE) IMPLANT
WATER STERILE IRR 250ML POUR (IV SOLUTION) ×2 IMPLANT

## 2021-12-31 NOTE — H&P (Signed)
[facility]   Primary Care Physician:  Biagio Borg, MD Ophthalmologist: Dr. Merleen Nicely  Pre-Procedure History & Physical: HPI:  Kathy [name] is a 81 y.o. female here for cataract surgery.   Past Medical History:  Diagnosis Date   Arthritis    fingers   CKD (chronic kidney disease) stage 3, GFR 30-59 ml/min (HCC) 11/30/2017   Depression    Dizziness    in AM, getting out of bed   Dysrhythmia    A fib   Endometrial ca ([place]) 11/30/2017   S/p surgury 1990's   GERD (gastroesophageal reflux disease) 11/30/2017   HLD (hyperlipidemia) 11/30/2017   Hypercholesteremia    Hypertension    Interstitial lung disease ([place])    Neuropathy    bilateral feet   Pneumonia 12/14/2021   Shortness of breath dyspnea    Sleep apnea    has CPAP, doesn't use   Umbilical hernia     Past Surgical History:  Procedure Laterality Date   ABDOMINAL HYSTERECTOMY     BROW LIFT Bilateral 07/15/2015   Procedure: BLEPHAROPLASTY;  Surgeon: Karle Starch, MD;  Location: [place];  Service: Ophthalmology;  Laterality: Bilateral;   CHOLECYSTECTOMY     HAMMER TOE SURGERY     HERNIA REPAIR     KNEE ARTHROSCOPY Bilateral    PTOSIS REPAIR Bilateral 07/15/2015   Procedure: PTOSIS REPAIR;  Surgeon: Karle Starch, MD;  Location: [place];  Service: Ophthalmology;  Laterality: Bilateral;  CPAP   TONSILLECTOMY      Prior to Admission medications   Medication Sig Start Date End Date Taking? Authorizing Provider  albuterol (VENTOLIN HFA) 108 (90 Base) MCG/ACT inhaler Inhale 1-2 puffs into the lungs every 6 (six) hours as needed for wheezing or shortness of breath. 03/23/21  Yes Biagio Borg, MD  amitriptyline (ELAVIL) 100 MG tablet TAKE 1/2 - 1 TABLET BY MOUTH AT BEDTIME FOR SLEEP AND FEET PAIN Patient taking differently: Take 50-100 mg by mouth at bedtime. 09/28/21  Yes Biagio Borg, MD  apixaban (ELIQUIS) 5 MG TABS tablet Take 1 tablet (5 mg total) by mouth 2 (two) times daily. 03/23/21   Yes Biagio Borg, MD  Cholecalciferol (VITAMIN D-3) 25 MCG (1000 UT) CAPS Take 1 capsule by mouth daily.   Yes [provider]  cyanocobalamin (,VITAMIN B-12,) 1000 MCG/ML injection INJECT 1 ML IM EVERY 30 DAYS Patient taking differently: Inject 1,000 mcg into the muscle every 30 (thirty) days. 11/10/21  Yes Biagio Borg, MD  diltiazem (CARDIZEM) 120 MG tablet Take 120 mg by mouth daily.   Yes [provider]  fenofibrate micronized (LOFIBRA) 134 MG capsule TAKE 1 CAPSULE BY MOUTH EVERY DAY IN THE MORNING BEFORE BREAKFAST Patient taking differently: Take 134 mg by mouth daily before breakfast. 03/23/21  Yes Biagio Borg, MD  furosemide (LASIX) 40 MG tablet Take 1 tablet (40 mg total) by mouth every other day. 1 tab by mouth in the AM, and 1 tab by mouth in the PM as needed for persistent swelling or weight gain more than 3-5 lbs 12/04/21  Yes Nita Sells, MD  losartan (COZAAR) 100 MG tablet Take 100 mg by mouth daily.   Yes [provider]  meloxicam (MOBIC) 15 MG tablet Take 15 mg by mouth daily.   Yes [provider]  metoprolol succinate (TOPROL-XL) 50 MG 24 hr tablet Take 1 tablet (50 mg total) by mouth daily. Take with or immediately following a meal. Patient  taking differently: Take 50 mg by mouth daily. 03/23/21  Yes Biagio Borg, MD  pantoprazole (PROTONIX) 40 MG tablet Take 1 tablet (40 mg total) by mouth daily. 03/23/21  Yes Biagio Borg, MD  Respiratory Therapy Supplies (FLUTTER) DEVI Use as directed 02/13/19  Yes Lauraine Rinne, NP    Allergies as of 12/16/2021 - Review Complete 12/11/2021  Allergen Reaction Noted   Lipitor [atorvastatin] Other (See Comments) 07/08/2015   Requip [ropinirole hcl] Other (See Comments) 02/13/2017    Family History  Problem Relation Age of Onset   Congestive Heart Failure Mother    Stroke Father    Parkinson's disease Father    Diabetes Son     Social History   Socioeconomic History   Marital status:  Married    Spouse name: Not on file   Number of children: Not on file   Years of education: Not on file   Highest education level: Not on file  Occupational History   Occupation: Retired  Tobacco Use   Smoking status: Former    Packs/day: 1.00    Years: 10.00    Total pack years: 10.00    Types: Cigarettes    Quit date: 06/28/1986    Years since quitting: 35.5   Smokeless tobacco: Never   Tobacco comments:    quit 40+ yrs ago, 1 PPD for a few years  Vaping Use   Vaping Use: Never used  Substance and Sexual Activity   Alcohol use: No   Drug use: No   Sexual activity: Not on file  Other Topics Concern   Not on file  Social History Narrative   Not on file   Social Determinants of Health   Financial Resource Strain: Not on file  Food Insecurity: Not on file  Transportation Needs: Not on file  Physical Activity: Not on file  Stress: Not on file  Social Connections: Not on file  Intimate Partner Violence: Not on file    Review of Systems: See HPI, otherwise negative ROS  Physical Exam: BP (!) 146/87   Pulse (!) 145   Temp 97.7 F (36.5 C) (Temporal)   Resp 20   Wt 100.2 kg   SpO2 96%   BMI 43.16 kg/m  General:   Alert, cooperative in NAD Head:  Normocephalic and atraumatic. Respiratory:  Normal work of breathing. Cardiovascular:  Tachcardic  Impression/Plan: Kathy [name] is here for cataract surgery.  Risks, benefits, limitations, and alternatives regarding cataract surgery have been reviewed with the patient.  Questions have been answered.  All parties agreeable.   Norvel Richards, MD  12/31/2021, 1:00 PM

## 2021-12-31 NOTE — Transfer of Care (Signed)
Immediate Anesthesia Transfer of Care Note  Patient: Kathy [name]  Procedure(s) Performed: CATARACT EXTRACTION PHACO AND INTRAOCULAR LENS PLACEMENT (IOC) RIGHT (Right: Eye)  Patient Location: PACU  Anesthesia Type: MAC  Level of Consciousness: awake, alert  and patient cooperative  Airway and Oxygen Therapy: Patient Spontanous Breathing and Patient connected to supplemental oxygen  Post-op Assessment: Post-op Vital signs reviewed, Patient's Cardiovascular Status Stable, Respiratory Function Stable, Patent Airway and No signs of Nausea or vomiting  Post-op Vital Signs: Reviewed and stable  Complications: No notable events documented.

## 2021-12-31 NOTE — Anesthesia Postprocedure Evaluation (Signed)
Anesthesia Post Note  Patient: Kathy [name]  Procedure(s) Performed: CATARACT EXTRACTION PHACO AND INTRAOCULAR LENS PLACEMENT (IOC) RIGHT (Right: Eye)     Patient location during evaluation: PACU Anesthesia Type: MAC Level of consciousness: awake and alert Pain management: pain level controlled Vital Signs Assessment: post-procedure vital signs reviewed and stable Respiratory status: spontaneous breathing, nonlabored ventilation, respiratory function stable and patient connected to nasal cannula oxygen Cardiovascular status: stable and blood pressure returned to baseline Postop Assessment: no apparent nausea or vomiting Anesthetic complications: no   No notable events documented.  Alexiya Franqui A  Kelly Ranieri

## 2021-12-31 NOTE — Anesthesia Procedure Notes (Signed)
Procedure Name: MAC Date/Time: 12/31/2021 1:45 PM  Performed by: Dionne Bucy, CRNAPre-anesthesia Checklist: Patient identified, Emergency Drugs available, Suction available, Patient being monitored and Timeout performed Patient Re-evaluated:Patient Re-evaluated prior to induction Oxygen Delivery Method: Nasal cannula Placement Confirmation: positive ETCO2

## 2021-12-31 NOTE — Op Note (Signed)
OPERATIVE NOTE  Kathy [name] [id number] 12/31/2021   PREOPERATIVE DIAGNOSIS: Nuclear sclerotic cataract right eye. H25.11   POSTOPERATIVE DIAGNOSIS: Nuclear sclerotic cataract right eye. H25.11   PROCEDURE:  Phacoemusification with posterior chamber intraocular lens placement of the right eye  Ultrasound time: Procedure(s) with comments: CATARACT EXTRACTION PHACO AND INTRAOCULAR LENS PLACEMENT (IOC) RIGHT (Right) - 17.11 1:46.1  LENS:   Implant Name Type Inv. Item Serial No. Manufacturer Lot No. LRB No. Used Action  LENS IOL TECNIS EYHANCE 19.5 - [id number] Intraocular Lens LENS IOL TECNIS EYHANCE 19.5 [id number] [employer]  Right 1 Implanted      SURGEON:  Courtney Heys. Lazarus Salines, MD   ANESTHESIA:  Topical with tetracaine drops, augmented with 1% preservative-free intracameral lidocaine.   COMPLICATIONS:  None.   DESCRIPTION OF PROCEDURE:  The patient was identified in the holding room and transported to the operating room and placed in the supine position under the operating microscope.  The right eye was identified as the operative eye, which was prepped and draped in the usual sterile ophthalmic fashion.   A 1 millimeter clear-corneal paracentesis was made superotemporally. Preservative-free 1% lidocaine mixed with 1:1,000 bisulfite-free aqueous solution of epinephrine was injected into the anterior chamber. The anterior chamber was then filled with Viscoat viscoelastic. A 2.4 millimeter keratome was used to make a clear-corneal incision inferotemporally. A curvilinear capsulorrhexis was made with a cystotome and capsulorrhexis forceps. Balanced salt solution was used to hydrodissect and hydrodelineate the nucleus. Phacoemulsification was then used to remove the lens nucleus and epinucleus. The remaining cortex was then removed using the irrigation and aspiration handpiece. Provisc was then placed into the capsular bag to distend it for lens placement. A +19.50 D DIB00 intraocular lens was then  injected into the capsular bag. The remaining viscoelastic was aspirated.   Wounds were hydrated with balanced salt solution.  The anterior chamber was inflated to a physiologic pressure with balanced salt solution.  No wound leaks were noted. Vigamox was injected intracamerally.  Timolol and Brimonidine drops were applied to the eye.  The patient was taken to the recovery room in stable condition without complications of anesthesia or surgery.  [name] 12/31/2021, 2:17 PM

## 2022-01-01 ENCOUNTER — Encounter: Payer: Self-pay | Admitting: Ophthalmology

## 2022-01-01 ENCOUNTER — Telehealth: Payer: Self-pay | Admitting: Internal Medicine

## 2022-01-01 NOTE — Telephone Encounter (Signed)
Kathy [name] a therapist at [facility] called to report a missed physical therapy appointment.  Fyi

## 2022-01-06 DIAGNOSIS — R3589 Other polyuria: Secondary | ICD-10-CM | POA: Diagnosis not present

## 2022-01-06 DIAGNOSIS — I4821 Permanent atrial fibrillation: Secondary | ICD-10-CM | POA: Diagnosis not present

## 2022-01-06 DIAGNOSIS — N183 Chronic kidney disease, stage 3 unspecified: Secondary | ICD-10-CM | POA: Diagnosis not present

## 2022-01-06 DIAGNOSIS — G9341 Metabolic encephalopathy: Secondary | ICD-10-CM | POA: Diagnosis not present

## 2022-01-06 DIAGNOSIS — I5022 Chronic systolic (congestive) heart failure: Secondary | ICD-10-CM | POA: Diagnosis not present

## 2022-01-06 DIAGNOSIS — I130 Hypertensive heart and chronic kidney disease with heart failure and stage 1 through stage 4 chronic kidney disease, or unspecified chronic kidney disease: Secondary | ICD-10-CM | POA: Diagnosis not present

## 2022-01-07 DIAGNOSIS — I4821 Permanent atrial fibrillation: Secondary | ICD-10-CM | POA: Diagnosis not present

## 2022-01-07 DIAGNOSIS — R3589 Other polyuria: Secondary | ICD-10-CM | POA: Diagnosis not present

## 2022-01-07 DIAGNOSIS — I130 Hypertensive heart and chronic kidney disease with heart failure and stage 1 through stage 4 chronic kidney disease, or unspecified chronic kidney disease: Secondary | ICD-10-CM | POA: Diagnosis not present

## 2022-01-07 DIAGNOSIS — G9341 Metabolic encephalopathy: Secondary | ICD-10-CM | POA: Diagnosis not present

## 2022-01-07 DIAGNOSIS — I5022 Chronic systolic (congestive) heart failure: Secondary | ICD-10-CM | POA: Diagnosis not present

## 2022-01-07 DIAGNOSIS — N183 Chronic kidney disease, stage 3 unspecified: Secondary | ICD-10-CM | POA: Diagnosis not present

## 2022-01-08 DIAGNOSIS — K429 Umbilical hernia without obstruction or gangrene: Secondary | ICD-10-CM | POA: Diagnosis not present

## 2022-01-08 DIAGNOSIS — M19049 Primary osteoarthritis, unspecified hand: Secondary | ICD-10-CM | POA: Diagnosis not present

## 2022-01-08 DIAGNOSIS — R7303 Prediabetes: Secondary | ICD-10-CM | POA: Diagnosis not present

## 2022-01-08 DIAGNOSIS — J849 Interstitial pulmonary disease, unspecified: Secondary | ICD-10-CM | POA: Diagnosis not present

## 2022-01-08 DIAGNOSIS — Z9981 Dependence on supplemental oxygen: Secondary | ICD-10-CM | POA: Diagnosis not present

## 2022-01-08 DIAGNOSIS — F341 Dysthymic disorder: Secondary | ICD-10-CM | POA: Diagnosis not present

## 2022-01-08 DIAGNOSIS — G63 Polyneuropathy in diseases classified elsewhere: Secondary | ICD-10-CM | POA: Diagnosis not present

## 2022-01-08 DIAGNOSIS — I5022 Chronic systolic (congestive) heart failure: Secondary | ICD-10-CM | POA: Diagnosis not present

## 2022-01-08 DIAGNOSIS — Z9049 Acquired absence of other specified parts of digestive tract: Secondary | ICD-10-CM | POA: Diagnosis not present

## 2022-01-08 DIAGNOSIS — G9341 Metabolic encephalopathy: Secondary | ICD-10-CM | POA: Diagnosis not present

## 2022-01-08 DIAGNOSIS — G4733 Obstructive sleep apnea (adult) (pediatric): Secondary | ICD-10-CM | POA: Diagnosis not present

## 2022-01-08 DIAGNOSIS — N183 Chronic kidney disease, stage 3 unspecified: Secondary | ICD-10-CM | POA: Diagnosis not present

## 2022-01-08 DIAGNOSIS — K219 Gastro-esophageal reflux disease without esophagitis: Secondary | ICD-10-CM | POA: Diagnosis not present

## 2022-01-08 DIAGNOSIS — I4821 Permanent atrial fibrillation: Secondary | ICD-10-CM | POA: Diagnosis not present

## 2022-01-08 DIAGNOSIS — Z8616 Personal history of COVID-19: Secondary | ICD-10-CM | POA: Diagnosis not present

## 2022-01-08 DIAGNOSIS — R3589 Other polyuria: Secondary | ICD-10-CM | POA: Diagnosis not present

## 2022-01-08 DIAGNOSIS — Z8542 Personal history of malignant neoplasm of other parts of uterus: Secondary | ICD-10-CM | POA: Diagnosis not present

## 2022-01-08 DIAGNOSIS — Z6839 Body mass index (BMI) 39.0-39.9, adult: Secondary | ICD-10-CM | POA: Diagnosis not present

## 2022-01-08 DIAGNOSIS — Z7901 Long term (current) use of anticoagulants: Secondary | ICD-10-CM | POA: Diagnosis not present

## 2022-01-08 DIAGNOSIS — Z87891 Personal history of nicotine dependence: Secondary | ICD-10-CM | POA: Diagnosis not present

## 2022-01-08 DIAGNOSIS — E7800 Pure hypercholesterolemia, unspecified: Secondary | ICD-10-CM | POA: Diagnosis not present

## 2022-01-08 DIAGNOSIS — I130 Hypertensive heart and chronic kidney disease with heart failure and stage 1 through stage 4 chronic kidney disease, or unspecified chronic kidney disease: Secondary | ICD-10-CM | POA: Diagnosis not present

## 2022-01-08 DIAGNOSIS — J9611 Chronic respiratory failure with hypoxia: Secondary | ICD-10-CM | POA: Diagnosis not present

## 2022-01-12 DIAGNOSIS — I5022 Chronic systolic (congestive) heart failure: Secondary | ICD-10-CM | POA: Diagnosis not present

## 2022-01-12 DIAGNOSIS — I130 Hypertensive heart and chronic kidney disease with heart failure and stage 1 through stage 4 chronic kidney disease, or unspecified chronic kidney disease: Secondary | ICD-10-CM | POA: Diagnosis not present

## 2022-01-12 DIAGNOSIS — G9341 Metabolic encephalopathy: Secondary | ICD-10-CM | POA: Diagnosis not present

## 2022-01-12 DIAGNOSIS — I4821 Permanent atrial fibrillation: Secondary | ICD-10-CM | POA: Diagnosis not present

## 2022-01-12 DIAGNOSIS — R3589 Other polyuria: Secondary | ICD-10-CM | POA: Diagnosis not present

## 2022-01-12 DIAGNOSIS — N183 Chronic kidney disease, stage 3 unspecified: Secondary | ICD-10-CM | POA: Diagnosis not present

## 2022-01-14 DIAGNOSIS — G9341 Metabolic encephalopathy: Secondary | ICD-10-CM | POA: Diagnosis not present

## 2022-01-14 DIAGNOSIS — R3589 Other polyuria: Secondary | ICD-10-CM | POA: Diagnosis not present

## 2022-01-14 DIAGNOSIS — N183 Chronic kidney disease, stage 3 unspecified: Secondary | ICD-10-CM | POA: Diagnosis not present

## 2022-01-14 DIAGNOSIS — I5022 Chronic systolic (congestive) heart failure: Secondary | ICD-10-CM | POA: Diagnosis not present

## 2022-01-14 DIAGNOSIS — I130 Hypertensive heart and chronic kidney disease with heart failure and stage 1 through stage 4 chronic kidney disease, or unspecified chronic kidney disease: Secondary | ICD-10-CM | POA: Diagnosis not present

## 2022-01-14 DIAGNOSIS — I4821 Permanent atrial fibrillation: Secondary | ICD-10-CM | POA: Diagnosis not present

## 2022-01-15 ENCOUNTER — Other Ambulatory Visit: Payer: Self-pay | Admitting: Internal Medicine

## 2022-01-15 NOTE — Telephone Encounter (Signed)
Please refill as per office routine med refill policy (all routine meds to be refilled for 3 mo or monthly (per pt preference) up to one year from last visit, then month to month grace period for 3 mo, then further med refills will have to be denied) ? ?

## 2022-01-20 DIAGNOSIS — N183 Chronic kidney disease, stage 3 unspecified: Secondary | ICD-10-CM | POA: Diagnosis not present

## 2022-01-20 DIAGNOSIS — I5022 Chronic systolic (congestive) heart failure: Secondary | ICD-10-CM | POA: Diagnosis not present

## 2022-01-20 DIAGNOSIS — I130 Hypertensive heart and chronic kidney disease with heart failure and stage 1 through stage 4 chronic kidney disease, or unspecified chronic kidney disease: Secondary | ICD-10-CM | POA: Diagnosis not present

## 2022-01-20 DIAGNOSIS — G9341 Metabolic encephalopathy: Secondary | ICD-10-CM | POA: Diagnosis not present

## 2022-01-20 DIAGNOSIS — R3589 Other polyuria: Secondary | ICD-10-CM | POA: Diagnosis not present

## 2022-01-20 DIAGNOSIS — I4821 Permanent atrial fibrillation: Secondary | ICD-10-CM | POA: Diagnosis not present

## 2022-01-28 ENCOUNTER — Encounter: Payer: Self-pay | Admitting: Internal Medicine

## 2022-01-28 DIAGNOSIS — N183 Chronic kidney disease, stage 3 unspecified: Secondary | ICD-10-CM | POA: Diagnosis not present

## 2022-01-28 DIAGNOSIS — I130 Hypertensive heart and chronic kidney disease with heart failure and stage 1 through stage 4 chronic kidney disease, or unspecified chronic kidney disease: Secondary | ICD-10-CM | POA: Diagnosis not present

## 2022-01-28 DIAGNOSIS — G9341 Metabolic encephalopathy: Secondary | ICD-10-CM | POA: Diagnosis not present

## 2022-01-28 DIAGNOSIS — I4821 Permanent atrial fibrillation: Secondary | ICD-10-CM | POA: Diagnosis not present

## 2022-01-28 DIAGNOSIS — R3589 Other polyuria: Secondary | ICD-10-CM | POA: Diagnosis not present

## 2022-01-28 DIAGNOSIS — I5022 Chronic systolic (congestive) heart failure: Secondary | ICD-10-CM | POA: Diagnosis not present

## 2022-01-28 DIAGNOSIS — N1831 Chronic kidney disease, stage 3a: Secondary | ICD-10-CM

## 2022-01-28 NOTE — Telephone Encounter (Signed)
Yes please make ROV in 1-2 wks after the next kidney tests    thanks

## 2022-01-29 DIAGNOSIS — Z961 Presence of intraocular lens: Secondary | ICD-10-CM | POA: Diagnosis not present

## 2022-01-29 NOTE — Addendum Note (Signed)
Addended by: Earnstine Regal on: 01/29/2022 08:49 AM   Modules accepted: Orders

## 2022-02-01 NOTE — Telephone Encounter (Signed)
Called pt inform her MD would like he to have labs done. Then make appt for 1-2 weeks. Made appt for 02/09/22.Kathy KitchenJohny Chess

## 2022-02-02 ENCOUNTER — Other Ambulatory Visit (INDEPENDENT_AMBULATORY_CARE_PROVIDER_SITE_OTHER): Payer: Medicare Other

## 2022-02-02 DIAGNOSIS — I130 Hypertensive heart and chronic kidney disease with heart failure and stage 1 through stage 4 chronic kidney disease, or unspecified chronic kidney disease: Secondary | ICD-10-CM | POA: Diagnosis not present

## 2022-02-02 DIAGNOSIS — R3589 Other polyuria: Secondary | ICD-10-CM | POA: Diagnosis not present

## 2022-02-02 DIAGNOSIS — N1831 Chronic kidney disease, stage 3a: Secondary | ICD-10-CM

## 2022-02-02 DIAGNOSIS — I5022 Chronic systolic (congestive) heart failure: Secondary | ICD-10-CM | POA: Diagnosis not present

## 2022-02-02 DIAGNOSIS — I4821 Permanent atrial fibrillation: Secondary | ICD-10-CM | POA: Diagnosis not present

## 2022-02-02 DIAGNOSIS — G9341 Metabolic encephalopathy: Secondary | ICD-10-CM | POA: Diagnosis not present

## 2022-02-02 DIAGNOSIS — N183 Chronic kidney disease, stage 3 unspecified: Secondary | ICD-10-CM | POA: Diagnosis not present

## 2022-02-03 LAB — CBC WITH DIFFERENTIAL/PLATELET
Basophils Absolute: 0 10*3/uL (ref 0.0–0.1)
Basophils Relative: 0.7 % (ref 0.0–3.0)
Eosinophils Absolute: 0.2 10*3/uL (ref 0.0–0.7)
Eosinophils Relative: 3.3 % (ref 0.0–5.0)
HCT: 36.8 % (ref 36.0–46.0)
Hemoglobin: 12.5 g/dL (ref 12.0–15.0)
Lymphocytes Relative: 35.6 % (ref 12.0–46.0)
Lymphs Abs: 2.4 10*3/uL (ref 0.7–4.0)
MCHC: 33.9 g/dL (ref 30.0–36.0)
MCV: 96.4 fl (ref 78.0–100.0)
Monocytes Absolute: 0.6 10*3/uL (ref 0.1–1.0)
Monocytes Relative: 9.3 % (ref 3.0–12.0)
Neutro Abs: 3.4 10*3/uL (ref 1.4–7.7)
Neutrophils Relative %: 51.1 % (ref 43.0–77.0)
Platelets: 193 10*3/uL (ref 150.0–400.0)
RBC: 3.82 Mil/uL — ABNORMAL LOW (ref 3.87–5.11)
RDW: 13.9 % (ref 11.5–15.5)
WBC: 6.7 10*3/uL (ref 4.0–10.5)

## 2022-02-09 ENCOUNTER — Ambulatory Visit (INDEPENDENT_AMBULATORY_CARE_PROVIDER_SITE_OTHER): Payer: Medicare Other | Admitting: Internal Medicine

## 2022-02-09 ENCOUNTER — Ambulatory Visit (INDEPENDENT_AMBULATORY_CARE_PROVIDER_SITE_OTHER): Payer: Medicare Other

## 2022-02-09 VITALS — BP 112/60 | HR 75 | Temp 97.8°F | Ht 60.0 in | Wt 225.0 lb

## 2022-02-09 DIAGNOSIS — N1831 Chronic kidney disease, stage 3a: Secondary | ICD-10-CM | POA: Diagnosis not present

## 2022-02-09 DIAGNOSIS — R739 Hyperglycemia, unspecified: Secondary | ICD-10-CM | POA: Diagnosis not present

## 2022-02-09 DIAGNOSIS — I10 Essential (primary) hypertension: Secondary | ICD-10-CM

## 2022-02-09 DIAGNOSIS — F32A Depression, unspecified: Secondary | ICD-10-CM

## 2022-02-09 DIAGNOSIS — Z0000 Encounter for general adult medical examination without abnormal findings: Secondary | ICD-10-CM

## 2022-02-09 MED ORDER — CITALOPRAM HYDROBROMIDE 10 MG PO TABS: 10.0000 mg | ORAL_TABLET | Freq: Every day | ORAL | 3 refills | Status: DC

## 2022-02-09 NOTE — Progress Notes (Signed)
Subjective:   Kathy [name] is a 81 y.o. female who presents for Medicare Annual (Subsequent) preventive examination.  Review of Systems     Cardiac Risk Factors include: advanced age (>50mn, >>80women);dyslipidemia;family history of premature cardiovascular disease;hypertension;obesity (BMI >30kg/m2);sedentary lifestyle     Objective:    Today's Vitals   02/09/22 1307  BP: 112/60  Pulse: 75  Temp: 97.8 F (36.6 C)  SpO2: 90%  Weight: 225 lb (102.1 kg)  Height: 5' (1.524 m)  PainSc: 0-No pain   Body mass index is 43.94 kg/m.     02/09/2022    1:17 PM 12/02/2021   10:47 AM 12/01/2021    9:06 PM 05/20/2020   10:43 AM 05/23/2018   10:59 AM 12/19/2017    1:03 PM 02/13/2017    4:13 PM  Advanced Directives  Does Patient Have a Medical Advance Directive? No No Unable to assess, patient is non-responsive or altered mental status No No No No  Would patient like information on creating a medical advance directive? No - Patient declined No - Patient declined  No - Patient declined No - Patient declined No - Patient declined     Current Medications (verified) Outpatient Encounter Medications as of 02/09/2022  Medication Sig   albuterol (VENTOLIN HFA) 108 (90 Base) MCG/ACT inhaler Inhale 1-2 puffs into the lungs every 6 (six) hours as needed for wheezing or shortness of breath.   amitriptyline (ELAVIL) 100 MG tablet TAKE 1/2 - 1 TABLET BY MOUTH AT BEDTIME FOR SLEEP AND FEET PAIN (Patient taking differently: Take 50-100 mg by mouth at bedtime.)   apixaban (ELIQUIS) 5 MG TABS tablet Take 1 tablet (5 mg total) by mouth 2 (two) times daily.   Cholecalciferol (VITAMIN D-3) 25 MCG (1000 UT) CAPS Take 1 capsule by mouth daily.   cyanocobalamin (,VITAMIN B-12,) 1000 MCG/ML injection INJECT 1 ML IM EVERY 30 DAYS (Patient taking differently: Inject 1,000 mcg into the muscle every 30 (thirty) days.)   diltiazem (CARDIZEM) 120 MG tablet Take 120 mg by mouth daily.   fenofibrate micronized (LOFIBRA)  134 MG capsule TAKE 1 CAPSULE BY MOUTH EVERY DAY IN THE MORNING BEFORE BREAKFAST (Patient taking differently: Take 134 mg by mouth daily before breakfast.)   furosemide (LASIX) 40 MG tablet Take 1 tablet (40 mg total) by mouth every other day. 1 tab by mouth in the AM, and 1 tab by mouth in the PM as needed for persistent swelling or weight gain more than 3-5 lbs   losartan (COZAAR) 100 MG tablet TAKE 1/2 (ONE HALF) TABLET BY MOUTH ONCE DAILY   meloxicam (MOBIC) 15 MG tablet Take 15 mg by mouth daily.   metoprolol succinate (TOPROL-XL) 50 MG 24 hr tablet Take 1 tablet (50 mg total) by mouth daily. Take with or immediately following a meal. (Patient taking differently: Take 50 mg by mouth daily.)   pantoprazole (PROTONIX) 40 MG tablet Take 1 tablet (40 mg total) by mouth daily.   Respiratory Therapy Supplies (FLUTTER) DEVI Use as directed   No facility-administered encounter medications on file as of 02/09/2022.    Allergies (verified) Lipitor [atorvastatin] and Requip [ropinirole hcl]   History: Past Medical History:  Diagnosis Date   Arthritis    fingers   CKD (chronic kidney disease) stage 3, GFR 30-59 ml/min (HCC) 11/30/2017   Depression    Dizziness    in AM, getting out of bed   Dysrhythmia    A fib   Endometrial ca ([place] 11/30/2017  S/p surgury 1990's   GERD (gastroesophageal reflux disease) 11/30/2017   HLD (hyperlipidemia) 11/30/2017   Hypercholesteremia    Hypertension    Interstitial lung disease ([place])    Neuropathy    bilateral feet   Pneumonia 12/14/2021   Shortness of breath dyspnea    Sleep apnea    has CPAP, doesn't use   Umbilical hernia    Past Surgical History:  Procedure Laterality Date   ABDOMINAL HYSTERECTOMY     BROW LIFT Bilateral 07/15/2015   Procedure: BLEPHAROPLASTY;  Surgeon: Karle Starch, MD;  Location: [place];  Service: Ophthalmology;  Laterality: Bilateral;   CATARACT EXTRACTION W/PHACO Right 12/31/2021   Procedure: CATARACT  EXTRACTION PHACO AND INTRAOCULAR LENS PLACEMENT ([place]) RIGHT;  Surgeon: Norvel Richards, MD;  Location: [place];  Service: Ophthalmology;  Laterality: Right;  17.11 1:46.1   CHOLECYSTECTOMY     HAMMER TOE SURGERY     HERNIA REPAIR     KNEE ARTHROSCOPY Bilateral    PTOSIS REPAIR Bilateral 07/15/2015   Procedure: PTOSIS REPAIR;  Surgeon: Karle Starch, MD;  Location: [place];  Service: Ophthalmology;  Laterality: Bilateral;  CPAP   TONSILLECTOMY     Family History  Problem Relation Age of Onset   Congestive Heart Failure Mother    Stroke Father    Parkinson's disease Father    Diabetes Son    Social History   Socioeconomic History   Marital status: Married    Spouse name: Not on file   Number of children: Not on file   Years of education: Not on file   Highest education level: Not on file  Occupational History   Occupation: Retired  Tobacco Use   Smoking status: Former    Packs/day: 1.00    Years: 10.00    Total pack years: 10.00    Types: Cigarettes    Quit date: 06/28/1986    Years since quitting: 35.6   Smokeless tobacco: Never   Tobacco comments:    quit 40+ yrs ago, 1 PPD for a few years  Vaping Use   Vaping Use: Never used  Substance and Sexual Activity   Alcohol use: No   Drug use: No   Sexual activity: Not on file  Other Topics Concern   Not on file  Social History Narrative   Not on file   Social Determinants of Health   Financial Resource Strain: [place]  (02/09/2022)   Overall Financial Resource Strain (CARDIA)    Difficulty of Paying Living Expenses: Not hard at all  Food Insecurity: No Food Insecurity (02/09/2022)   Hunger Vital Sign    Worried About Running Out of Food in the Last Year: Never true    [place] in the Last Year: Never true  Transportation Needs: No Transportation Needs (02/09/2022)   PRAPARE - Hydrologist (Medical): No    Lack of Transportation (Non-Medical): No   Physical Activity: Inactive (02/09/2022)   Exercise Vital Sign    Days of Exercise per Week: 0 days    Minutes of Exercise per Session: 0 min  Stress: No Stress Concern Present (02/09/2022)   [place]    Feeling of Stress : Not at all  Social Connections: Moderately Isolated (02/09/2022)   Social Connection and Isolation Panel [NHANES]    Frequency of Communication with Friends and Family: More than three times a week    Frequency  of Social Gatherings with Friends and Family: More than three times a week    Attends Religious Services: Never    Marine scientist or Organizations: No    Attends Music therapist: Never    Marital Status: Married    Tobacco Counseling Counseling given: Not Answered Tobacco comments: quit 40+ yrs ago, 1 PPD for a few years   Clinical Intake:  Pre-visit preparation completed: Yes  Pain : No/denies pain Pain Score: 0-No pain     BMI - recorded: 43.94 Nutritional Status: BMI > 30  Obese Nutritional Risks: None Diabetes: No  How often do you need to have someone help you when you read instructions, pamphlets, or other written materials from your doctor or pharmacy?: 1 - Never What is the last grade level you completed in school?: Associate's Degree from [employer]  Diabetic? no  Interpreter Needed?: No  Information entered by :: Lisette Abu, LPN.   Activities of Daily Living    02/09/2022    1:37 PM 12/02/2021   10:00 PM  In your present state of health, do you have any difficulty performing the following activities:  Hearing? 0 0  Vision? 0 0  Difficulty concentrating or making decisions? 1 0  Walking or climbing stairs? 1 0  Dressing or bathing? 0 0  Doing errands, shopping? 1 0  Comment patient does not drive anymore   Preparing Food and eating ? N   Using the Toilet? N   In the past six months, have you accidently leaked urine? Y   Do you have  problems with loss of bowel control? N   Managing your Medications? N   Managing your Finances? N   Housekeeping or managing your Housekeeping? N     Patient Care Team: Biagio Borg, MD as PCP - General (Internal Medicine) Norvel Richards, MD as Consulting Physician (Ophthalmology)  Indicate any recent Medical Services you may have received from other than [facility] providers in the past year (date may be approximate).     Assessment:   This is a routine wellness examination for [facility].  Hearing/Vision screen Hearing Screening - Comments:: Patient denied any hearing difficulty.   No hearing aids.  Vision Screening - Comments:: Patient does wear readers.  Eye exam done by: Norvel Richards, MD.   Dietary issues and exercise activities discussed: Current Exercise Habits: The patient does not participate in regular exercise at present, Exercise limited by: cardiac condition(s);respiratory conditions(s);orthopedic condition(s)   Goals Addressed             This Visit's Progress    Stay alive and kicking.        Depression Screen    02/09/2022    1:35 PM 12/11/2021    1:53 PM 09/22/2021    8:44 AM 03/23/2021    9:51 AM 12/18/2019   10:55 AM 11/16/2018    2:06 PM 12/19/2017    1:04 PM  PHQ 2/9 Scores  PHQ - 2 Score '1 3 2 '$ 0 0 1 1  PHQ- 9 Score  18 8        Fall Risk    12/11/2021    1:52 PM 09/22/2021    9:07 AM 09/22/2021    8:44 AM 03/23/2021    9:52 AM 12/18/2019   10:55 AM  Fall Risk   Falls in the past year? '1 1 1 '$ 0 0  Number falls in past yr: 1 0 0 0   Injury with Fall? 0 0  0 0     FALL RISK PREVENTION PERTAINING TO THE HOME:  Any stairs in or around the home? No  If so, are there any without handrails? No  Home free of loose throw rugs in walkways, pet beds, electrical cords, etc? Yes  Adequate lighting in your home to reduce risk of falls? Yes   ASSISTIVE DEVICES UTILIZED TO PREVENT FALLS:  Life alert? No  Use of a cane, walker or w/c? Yes  Grab  bars in the bathroom? Yes  Shower chair or bench in shower? Yes  Elevated toilet seat or a handicapped toilet? Yes   TIMED UP AND GO:  Was the test performed? Yes .  Length of time to ambulate 10 feet: 10 sec.   Gait slow and steady with assistive device  Cognitive Function:        02/09/2022    1:38 PM  6CIT Screen  What Year? 0 points  What month? 0 points  What time? 0 points  Count back from 20 0 points  Months in reverse 0 points  Repeat phrase 0 points  Total Score 0 points    Immunizations Immunization History  Administered Date(s) Administered   Fluad Quad(high Dose 65+) 06/08/2019, 04/01/2020   Influenza, High Dose Seasonal PF 04/04/2017, 03/14/2018   PFIZER(Purple Top)SARS-COV-2 Vaccination 10/04/2019, 10/25/2019, 04/24/2020   Pneumococcal Conjugate-13 11/30/2017   Pneumococcal Polysaccharide-23 06/30/2016   Tdap 11/30/2017    TDAP status: Up to date  Flu Vaccine status: Due, Education has been provided regarding the importance of this vaccine. Advised may receive this vaccine at local pharmacy or Health Dept. Aware to provide a copy of the vaccination record if obtained from local pharmacy or Health Dept. Verbalized acceptance and understanding.  Pneumococcal vaccine status: Up to date  Covid-19 vaccine status: Completed vaccines  Qualifies for Shingles Vaccine? Yes   Zostavax completed No   Shingrix Completed?: No.    Education has been provided regarding the importance of this vaccine. Patient has been advised to call insurance company to determine out of pocket expense if they have not yet received this vaccine. Advised may also receive vaccine at local pharmacy or Health Dept. Verbalized acceptance and understanding.  Screening Tests Health Maintenance  Topic Date Due   Zoster Vaccines- Shingrix (1 of 2) Never done   COVID-19 Vaccine (4 - Pfizer risk series) 06/19/2020   INFLUENZA VACCINE  01/26/2022   TETANUS/TDAP  12/01/2027   Pneumonia Vaccine  16+ Years old  Completed   DEXA SCAN  Completed   HPV VACCINES  Aged Out    Health Maintenance  Health Maintenance Due  Topic Date Due   Zoster Vaccines- Shingrix (1 of 2) Never done   COVID-19 Vaccine (4 - Pfizer risk series) 06/19/2020   INFLUENZA VACCINE  01/26/2022    Colorectal cancer screening: No longer required.   Mammogram status: No longer required due to age.  Bone Density status: No longer recommended due to age.  Lung Cancer Screening: (Low Dose CT Chest recommended if Age 45-80 years, 30 pack-year currently smoking OR have quit w/in 15years.) does not qualify.   Lung Cancer Screening Referral: no  Additional Screening:  Hepatitis C Screening: does not qualify; Completed no  Vision Screening: Recommended annual ophthalmology exams for early detection of glaucoma and other disorders of the eye. Is the patient up to date with their annual eye exam?  Yes Who is the provider or what is the name of the office in which the patient attends annual  eye exams? Norvel Richards, MD. If pt is not established with a provider, would they like to be referred to a provider to establish care? No .   Dental Screening: Recommended annual dental exams for proper oral hygiene  Community Resource Referral / Chronic Care Management: CRR required this visit?  No   CCM required this visit?  No      Plan:     I have personally reviewed and noted the following in the patient's chart:   Medical and social history Use of alcohol, tobacco or illicit drugs  Current medications and supplements including opioid prescriptions.  Functional ability and status Nutritional status Physical activity Advanced directives List of other physicians Hospitalizations, surgeries, and ER visits in previous 12 months Vitals Screenings to include cognitive, depression, and falls Referrals and appointments  In addition, I have reviewed and discussed with patient certain preventive  protocols, quality metrics, and best practice recommendations. A written personalized care plan for preventive services as well as general preventive health recommendations were provided to patient.     Sheral Flow, LPN   3/35/4562   Nurse Notes:  Hearing Screening - Comments:: Patient denied any hearing difficulty.   No hearing aids.  Vision Screening - Comments:: Patient does wear readers.  Eye exam done by: Norvel Richards, MD.

## 2022-02-09 NOTE — Patient Instructions (Signed)
Kathy [name] , Thank you for taking time to come for your Medicare Wellness Visit. I appreciate your ongoing commitment to your health goals. Please review the following plan we discussed and let me know if I can assist you in the future.   Screening recommendations/referrals: Colonoscopy: No longer recommended due to age. Mammogram: No longer recommended due to age. Bone Density: No longer recommended due to age. Recommended yearly ophthalmology/optometry visit for glaucoma screening and checkup Recommended yearly dental visit for hygiene and checkup  Vaccinations: Influenza vaccine: due Fall 2023 Pneumococcal vaccine: 06/30/2016, 11/30/2017 Tdap vaccine: 11/30/2017; due every 10 years Shingles vaccine: never done   Covid-19: 10/04/2019, 10/25/2019, 04/24/2020  Advanced directives: No  Conditions/risks identified: Yes  Next appointment: Please schedule your next Medicare Wellness Visit with your Nurse Health Advisor in 1 year by calling [phone].   Preventive Care 45 Years and Older, Female Preventive care refers to lifestyle choices and visits with your health care provider that can promote health and wellness. What does preventive care include? A yearly physical exam. This is also called an annual well check. Dental exams once or twice a year. Routine eye exams. Ask your health care provider how often you should have your eyes checked. Personal lifestyle choices, including: Daily care of your teeth and gums. Regular physical activity. Eating a healthy diet. Avoiding tobacco and drug use. Limiting alcohol use. Practicing safe sex. Taking low-dose aspirin every day. Taking vitamin and mineral supplements as recommended by your health care provider. What happens during an annual well check? The services and screenings done by your health care provider during your annual well check will depend on your age, overall health, lifestyle risk factors, and family history of  disease. Counseling  Your health care provider may ask you questions about your: Alcohol use. Tobacco use. Drug use. Emotional well-being. Home and relationship well-being. Sexual activity. Eating habits. History of falls. Memory and ability to understand (cognition). Work and work Statistician. Reproductive health. Screening  You may have the following tests or measurements: Height, weight, and BMI. Blood pressure. Lipid and cholesterol levels. These may be checked every 5 years, or more frequently if you are over 64 years old. Skin check. Lung cancer screening. You may have this screening every year starting at age 5 if you have a 30-pack-year history of smoking and currently smoke or have quit within the past 15 years. Fecal occult blood test (FOBT) of the stool. You may have this test every year starting at age 74. Flexible sigmoidoscopy or colonoscopy. You may have a sigmoidoscopy every 5 years or a colonoscopy every 10 years starting at age 69. Hepatitis C blood test. Hepatitis B blood test. Sexually transmitted disease (STD) testing. Diabetes screening. This is done by checking your blood sugar (glucose) after you have not eaten for a while (fasting). You may have this done every 1-3 years. Bone density scan. This is done to screen for osteoporosis. You may have this done starting at age 4. Mammogram. This may be done every 1-2 years. Talk to your health care provider about how often you should have regular mammograms. Talk with your health care provider about your test results, treatment options, and if necessary, the need for more tests. Vaccines  Your health care provider may recommend certain vaccines, such as: Influenza vaccine. This is recommended every year. Tetanus, diphtheria, and acellular pertussis (Tdap, Td) vaccine. You may need a Td booster every 10 years. Zoster vaccine. You may need this after age 63. Pneumococcal 13-valent  conjugate (PCV13) vaccine. One  dose is recommended after age 45. Pneumococcal polysaccharide (PPSV23) vaccine. One dose is recommended after age 109. Talk to your health care provider about which screenings and vaccines you need and how often you need them. This information is not intended to replace advice given to you by your health care provider. Make sure you discuss any questions you have with your health care provider. Document Released: 07/11/2015 Document Revised: 03/03/2016 Document Reviewed: 04/15/2015 Elsevier Interactive Patient Education  2017 [place] Prevention in the Home Falls can cause injuries. They can happen to people of all ages. There are many things you can do to make your home safe and to help prevent falls. What can I do on the outside of my home? Regularly fix the edges of walkways and driveways and fix any cracks. Remove anything that might make you trip as you walk through a door, such as a raised step or threshold. Trim any bushes or trees on the path to your home. Use bright outdoor lighting. Clear any walking paths of anything that might make someone trip, such as rocks or tools. Regularly check to see if handrails are loose or broken. Make sure that both sides of any steps have handrails. Any raised decks and porches should have guardrails on the edges. Have any leaves, snow, or ice cleared regularly. Use sand or salt on walking paths during winter. Clean up any spills in your garage right away. This includes oil or grease spills. What can I do in the bathroom? Use night lights. Install grab bars by the toilet and in the tub and shower. Do not use towel bars as grab bars. Use non-skid mats or decals in the tub or shower. If you need to sit down in the shower, use a plastic, non-slip stool. Keep the floor dry. Clean up any water that spills on the floor as soon as it happens. Remove soap buildup in the tub or shower regularly. Attach bath mats securely with double-sided  non-slip rug tape. Do not have throw rugs and other things on the floor that can make you trip. What can I do in the bedroom? Use night lights. Make sure that you have a light by your bed that is easy to reach. Do not use any sheets or blankets that are too big for your bed. They should not hang down onto the floor. Have a firm chair that has side arms. You can use this for support while you get dressed. Do not have throw rugs and other things on the floor that can make you trip. What can I do in the kitchen? Clean up any spills right away. Avoid walking on wet floors. Keep items that you use a lot in easy-to-reach places. If you need to reach something above you, use a strong step stool that has a grab bar. Keep electrical cords out of the way. Do not use floor polish or wax that makes floors slippery. If you must use wax, use non-skid floor wax. Do not have throw rugs and other things on the floor that can make you trip. What can I do with my stairs? Do not leave any items on the stairs. Make sure that there are handrails on both sides of the stairs and use them. Fix handrails that are broken or loose. Make sure that handrails are as long as the stairways. Check any carpeting to make sure that it is firmly attached to the stairs. Fix any carpet that  is loose or worn. Avoid having throw rugs at the top or bottom of the stairs. If you do have throw rugs, attach them to the floor with carpet tape. Make sure that you have a light switch at the top of the stairs and the bottom of the stairs. If you do not have them, ask someone to add them for you. What else can I do to help prevent falls? Wear shoes that: Do not have high heels. Have rubber bottoms. Are comfortable and fit you well. Are closed at the toe. Do not wear sandals. If you use a stepladder: Make sure that it is fully opened. Do not climb a closed stepladder. Make sure that both sides of the stepladder are locked into place. Ask  someone to hold it for you, if possible. Clearly mark and make sure that you can see: Any grab bars or handrails. First and last steps. Where the edge of each step is. Use tools that help you move around (mobility aids) if they are needed. These include: Canes. Walkers. Scooters. Crutches. Turn on the lights when you go into a dark area. Replace any light bulbs as soon as they burn out. Set up your furniture so you have a clear path. Avoid moving your furniture around. If any of your floors are uneven, fix them. If there are any pets around you, be aware of where they are. Review your medicines with your doctor. Some medicines can make you feel dizzy. This can increase your chance of falling. Ask your doctor what other things that you can do to help prevent falls. This information is not intended to replace advice given to you by your health care provider. Make sure you discuss any questions you have with your health care provider. Document Released: 04/10/2009 Document Revised: 11/20/2015 Document Reviewed: 07/19/2014 Elsevier Interactive Patient Education  2017 [employer].

## 2022-02-09 NOTE — Patient Instructions (Addendum)
Please take all new medication as prescribed - the celexa 10 mg per day  Please have your Shingrix (shingles) shots done at your local pharmacy.  Please continue all other medications as before, and refills have been done if requested.  Please have the pharmacy call with any other refills you may need.  Please continue your efforts at being more active, low cholesterol diet, and weight control.  You are otherwise up to date with prevention measures today.  Please keep your appointments with your specialists as you may have planned  Please make an Appointment to return in Jun 29, 2022, or sooner if needed

## 2022-02-09 NOTE — Progress Notes (Signed)
Patient ID: Kathy [name], female   DOB: 1941/02/02, 81 y.o.   MRN: [id number]        Chief Complaint: follow up htn, depression, hyperglycemia, ckd       HPI:  Kathy [name] is a 81 y.o. female here overall doing ok but c/o 2 days onset very very mild dysuria but Denies urinary symptoms such as frequency, flank pain, hematuria or n/v, fever, chills. Declines urine testing today   Pt denies chest pain, increased sob or doe, wheezing, orthopnea, PND, increased LE swelling, palpitations, dizziness or syncope.   Pt denies polydipsia, polyuria, or new focal neuro s/s.    Pt denies fever, wt loss, night sweats, loss of appetite, or other constitutional symptoms  Also has 1-2 mo worsening depressive symptoms mild to mod, but  no suicidal ideation, or panic; has ongoing anxiety.       Wt Readings from Last 3 Encounters:  02/09/22 225 lb (102.1 kg)  02/09/22 225 lb (102.1 kg)  12/31/21 221 lb (100.2 kg)   BP Readings from Last 3 Encounters:  02/09/22 112/60  02/09/22 112/60  12/31/21 (!) 113/98         Past Medical History:  Diagnosis Date   Arthritis    fingers   CKD (chronic kidney disease) stage 3, GFR 30-59 ml/min (HCC) 11/30/2017   Depression    Dizziness    in AM, getting out of bed   Dysrhythmia    A fib   Endometrial ca ([place]) 11/30/2017   S/p surgury 1990's   GERD (gastroesophageal reflux disease) 11/30/2017   HLD (hyperlipidemia) 11/30/2017   Hypercholesteremia    Hypertension    Interstitial lung disease ([place])    Neuropathy    bilateral feet   Pneumonia 12/14/2021   Shortness of breath dyspnea    Sleep apnea    has CPAP, doesn't use   Umbilical hernia    Past Surgical History:  Procedure Laterality Date   ABDOMINAL HYSTERECTOMY     BROW LIFT Bilateral 07/15/2015   Procedure: BLEPHAROPLASTY;  Surgeon: Karle Starch, MD;  Location: [facility];  Service: Ophthalmology;  Laterality: Bilateral;   CATARACT EXTRACTION W/PHACO Right 12/31/2021   Procedure: CATARACT EXTRACTION  PHACO AND INTRAOCULAR LENS PLACEMENT ([place]) RIGHT;  Surgeon: Norvel Richards, MD;  Location: [facility];  Service: Ophthalmology;  Laterality: Right;  17.11 1:46.1   CHOLECYSTECTOMY     HAMMER TOE SURGERY     HERNIA REPAIR     KNEE ARTHROSCOPY Bilateral    PTOSIS REPAIR Bilateral 07/15/2015   Procedure: PTOSIS REPAIR;  Surgeon: Karle Starch, MD;  Location: [facility];  Service: Ophthalmology;  Laterality: Bilateral;  CPAP   TONSILLECTOMY      reports that she quit smoking about 35 years ago. Her smoking use included cigarettes. She has a 10.00 pack-year smoking history. She has never used smokeless tobacco. She reports that she does not drink alcohol and does not use drugs. family history includes Congestive Heart Failure in her mother; Diabetes in her son; Parkinson's disease in her father; Stroke in her father. Allergies  Allergen Reactions   Lipitor [Atorvastatin] Other (See Comments)    Memory issues   Requip [Ropinirole Hcl] Other (See Comments)    Pt reports feeling generally unwell on this medication   Current Outpatient Medications on File Prior to Visit  Medication Sig Dispense Refill   albuterol (VENTOLIN HFA) 108 (90 Base) MCG/ACT inhaler Inhale 1-2 puffs into the lungs every 6 (six) hours  as needed for wheezing or shortness of breath. 1 each 11   amitriptyline (ELAVIL) 100 MG tablet TAKE 1/2 - 1 TABLET BY MOUTH AT BEDTIME FOR SLEEP AND FEET PAIN (Patient taking differently: Take 50-100 mg by mouth at bedtime.) 90 tablet 1   apixaban (ELIQUIS) 5 MG TABS tablet Take 1 tablet (5 mg total) by mouth 2 (two) times daily. 180 tablet 3   Cholecalciferol (VITAMIN D-3) 25 MCG (1000 UT) CAPS Take 1 capsule by mouth daily.     cyanocobalamin (,VITAMIN B-12,) 1000 MCG/ML injection INJECT 1 ML IM EVERY 30 DAYS (Patient taking differently: Inject 1,000 mcg into the muscle every 30 (thirty) days.) 3 mL 1   diltiazem (CARDIZEM) 120 MG tablet Take 120 mg by mouth daily.      fenofibrate micronized (LOFIBRA) 134 MG capsule TAKE 1 CAPSULE BY MOUTH EVERY DAY IN THE MORNING BEFORE BREAKFAST (Patient taking differently: Take 134 mg by mouth daily before breakfast.) 90 capsule 3   furosemide (LASIX) 40 MG tablet Take 1 tablet (40 mg total) by mouth every other day. 1 tab by mouth in the AM, and 1 tab by mouth in the PM as needed for persistent swelling or weight gain more than 3-5 lbs 60 tablet 11   losartan (COZAAR) 100 MG tablet TAKE 1/2 (ONE HALF) TABLET BY MOUTH ONCE DAILY 45 tablet 3   meloxicam (MOBIC) 15 MG tablet Take 15 mg by mouth daily.     metoprolol succinate (TOPROL-XL) 50 MG 24 hr tablet Take 1 tablet (50 mg total) by mouth daily. Take with or immediately following a meal. (Patient taking differently: Take 50 mg by mouth daily.) 90 tablet 3   pantoprazole (PROTONIX) 40 MG tablet Take 1 tablet (40 mg total) by mouth daily. 90 tablet 3   Respiratory Therapy Supplies (FLUTTER) DEVI Use as directed 1 each 0   STUDY - ASPIRE - apixaban 5 mg or placebo tablet (PI-Sethi) Per instructions TWICE A DAY (route: oral)     No current facility-administered medications on file prior to visit.        ROS:  All others reviewed and negative.  Objective        PE:  BP 112/60 (BP Location: Right Arm, Patient Position: Sitting, Cuff Size: Large)   Pulse 75   Temp 97.8 F (36.6 C) (Oral)   Ht 5' (1.524 m)   Wt 225 lb (102.1 kg)   SpO2 90%   BMI 43.94 kg/m                 Constitutional: Pt appears in NAD               HENT: Head: NCAT.                Right Ear: External ear normal.                 Left Ear: External ear normal.                Eyes: . Pupils are equal, round, and reactive to light. Conjunctivae and EOM are normal               Nose: without d/c or deformity               Neck: Neck supple. Gross normal ROM               Cardiovascular: Normal rate and regular rhythm.  Pulmonary/Chest: Effort normal and breath sounds without rales or  wheezing.                Abd:  Soft, NT, ND, + BS, no organomegaly               Neurological: Pt is alert. At baseline orientation, motor grossly intact               Skin: Skin is warm. No rashes, no other new lesions, LE edema - none               Psychiatric: Pt behavior is normal without agitation , depressed affect  Micro: none  Cardiac tracings I have personally interpreted today:  none  Pertinent Radiological findings (summarize): none   Lab Results  Component Value Date   WBC 6.7 02/03/2022   HGB 12.5 02/03/2022   HCT 36.8 02/03/2022   PLT 193.0 02/03/2022   GLUCOSE 120 (H) 12/11/2021   CHOL 152 09/22/2021   TRIG 92.0 09/22/2021   HDL 56.30 09/22/2021   LDLCALC 77 09/22/2021   ALT 10 12/11/2021   AST 20 12/11/2021   NA 139 12/11/2021   K 4.0 12/11/2021   CL 96 12/11/2021   CREATININE 2.12 (H) 12/11/2021   BUN 35 (H) 12/11/2021   CO2 32 12/11/2021   TSH 2.44 09/22/2021   HGBA1C 5.8 09/22/2021   Assessment/Plan:  Kathy [name] is a 81 y.o. White or Caucasian [1] female with  has a past medical history of Arthritis, CKD (chronic kidney disease) stage 3, GFR 30-59 ml/min (HCC) (11/30/2017), Depression, Dizziness, Dysrhythmia, Endometrial ca (HCC) (11/30/2017), GERD (gastroesophageal reflux disease) (11/30/2017), HLD (hyperlipidemia) (11/30/2017), Hypercholesteremia, Hypertension, Interstitial lung disease ([place]), Neuropathy, Pneumonia (12/14/2021), Shortness of breath dyspnea, Sleep apnea, and Umbilical hernia.  Depression Recent new worsening uncontrolled, for celexa 10 mg qd, declines referral psychiatry or counseling  CKD (chronic kidney disease) stage 3, GFR 30-59 ml/min (HCC) Lab Results  Component Value Date   CREATININE 2.12 (H) 12/11/2021   Stable overall, cont to avoid nephrotoxins   Essential hypertension BP Readings from Last 3 Encounters:  02/09/22 112/60  02/09/22 112/60  12/31/21 (!) 113/98   Stable, pt to continue medical treatment cardizem 120  qd   Hyperglycemia Lab Results  Component Value Date   HGBA1C 5.8 09/22/2021   Stable, pt to continue current medical treatment  - diet, wt control, excercise  Followup: Return in about 6 months (around 08/12/2022).  Cathlean Cower, MD 02/13/2022 11:52 AM [facility]

## 2022-02-13 ENCOUNTER — Encounter: Payer: Self-pay | Admitting: Internal Medicine

## 2022-02-13 NOTE — Assessment & Plan Note (Signed)
Lab Results  Component Value Date   HGBA1C 5.8 09/22/2021   Stable, pt to continue current medical treatment - diet, wt control, excercise  

## 2022-02-13 NOTE — Assessment & Plan Note (Signed)
Recent new worsening uncontrolled, for celexa 10 mg qd, declines referral psychiatry or counseling

## 2022-02-13 NOTE — Assessment & Plan Note (Signed)
BP Readings from Last 3 Encounters:  02/09/22 112/60  02/09/22 112/60  12/31/21 (!) 113/98   Stable, pt to continue medical treatment cardizem 120 qd

## 2022-02-13 NOTE — Assessment & Plan Note (Signed)
Lab Results  Component Value Date   CREATININE 2.12 (H) 12/11/2021   Stable overall, cont to avoid nephrotoxins  

## 2022-03-25 ENCOUNTER — Ambulatory Visit: Payer: Medicare Other | Admitting: Internal Medicine

## 2022-04-11 ENCOUNTER — Other Ambulatory Visit: Payer: Self-pay | Admitting: Internal Medicine

## 2022-04-11 NOTE — Telephone Encounter (Signed)
Please refill as per office routine med refill policy (all routine meds to be refilled for 3 mo or monthly (per pt preference) up to one year from last visit, then month to month grace period for 3 mo, then further med refills will have to be denied) ? ?

## 2022-05-03 ENCOUNTER — Other Ambulatory Visit: Payer: Self-pay | Admitting: Internal Medicine

## 2022-05-03 DIAGNOSIS — I4819 Other persistent atrial fibrillation: Secondary | ICD-10-CM

## 2022-05-03 NOTE — Telephone Encounter (Signed)
Please refill as per office routine med refill policy (all routine meds to be refilled for 3 mo or monthly (per pt preference) up to one year from last visit, then month to month grace period for 3 mo, then further med refills will have to be denied) ? ?

## 2022-05-11 ENCOUNTER — Other Ambulatory Visit: Payer: Self-pay | Admitting: Internal Medicine

## 2022-05-12 ENCOUNTER — Other Ambulatory Visit: Payer: Self-pay | Admitting: Internal Medicine

## 2022-05-12 DIAGNOSIS — I4819 Other persistent atrial fibrillation: Secondary | ICD-10-CM

## 2022-05-22 DIAGNOSIS — R350 Frequency of micturition: Secondary | ICD-10-CM | POA: Diagnosis not present

## 2022-05-22 DIAGNOSIS — D696 Thrombocytopenia, unspecified: Secondary | ICD-10-CM | POA: Diagnosis not present

## 2022-05-22 DIAGNOSIS — A419 Sepsis, unspecified organism: Secondary | ICD-10-CM | POA: Diagnosis not present

## 2022-05-22 DIAGNOSIS — R053 Chronic cough: Secondary | ICD-10-CM | POA: Diagnosis not present

## 2022-05-22 DIAGNOSIS — N189 Chronic kidney disease, unspecified: Secondary | ICD-10-CM | POA: Diagnosis not present

## 2022-05-22 DIAGNOSIS — G319 Degenerative disease of nervous system, unspecified: Secondary | ICD-10-CM | POA: Diagnosis not present

## 2022-05-22 DIAGNOSIS — R079 Chest pain, unspecified: Secondary | ICD-10-CM | POA: Diagnosis not present

## 2022-05-22 DIAGNOSIS — N390 Urinary tract infection, site not specified: Secondary | ICD-10-CM | POA: Diagnosis not present

## 2022-05-22 DIAGNOSIS — R531 Weakness: Secondary | ICD-10-CM | POA: Diagnosis not present

## 2022-05-22 DIAGNOSIS — I129 Hypertensive chronic kidney disease with stage 1 through stage 4 chronic kidney disease, or unspecified chronic kidney disease: Secondary | ICD-10-CM | POA: Diagnosis not present

## 2022-05-22 DIAGNOSIS — J986 Disorders of diaphragm: Secondary | ICD-10-CM | POA: Diagnosis not present

## 2022-05-22 DIAGNOSIS — I4891 Unspecified atrial fibrillation: Secondary | ICD-10-CM | POA: Diagnosis not present

## 2022-05-22 DIAGNOSIS — R0602 Shortness of breath: Secondary | ICD-10-CM | POA: Diagnosis not present

## 2022-05-22 DIAGNOSIS — R4789 Other speech disturbances: Secondary | ICD-10-CM | POA: Diagnosis not present

## 2022-05-22 DIAGNOSIS — E785 Hyperlipidemia, unspecified: Secondary | ICD-10-CM | POA: Diagnosis not present

## 2022-05-22 DIAGNOSIS — E860 Dehydration: Secondary | ICD-10-CM | POA: Diagnosis not present

## 2022-05-22 DIAGNOSIS — Z1152 Encounter for screening for COVID-19: Secondary | ICD-10-CM | POA: Diagnosis not present

## 2022-05-22 DIAGNOSIS — I252 Old myocardial infarction: Secondary | ICD-10-CM | POA: Diagnosis not present

## 2022-05-22 DIAGNOSIS — G928 Other toxic encephalopathy: Secondary | ICD-10-CM | POA: Diagnosis not present

## 2022-05-22 DIAGNOSIS — K529 Noninfective gastroenteritis and colitis, unspecified: Secondary | ICD-10-CM | POA: Diagnosis not present

## 2022-05-22 DIAGNOSIS — R9431 Abnormal electrocardiogram [ECG] [EKG]: Secondary | ICD-10-CM | POA: Diagnosis not present

## 2022-05-22 DIAGNOSIS — R4182 Altered mental status, unspecified: Secondary | ICD-10-CM | POA: Diagnosis not present

## 2022-05-22 DIAGNOSIS — I482 Chronic atrial fibrillation, unspecified: Secondary | ICD-10-CM | POA: Diagnosis not present

## 2022-05-22 DIAGNOSIS — R441 Visual hallucinations: Secondary | ICD-10-CM | POA: Diagnosis not present

## 2022-05-22 DIAGNOSIS — Z888 Allergy status to other drugs, medicaments and biological substances status: Secondary | ICD-10-CM | POA: Diagnosis not present

## 2022-05-22 DIAGNOSIS — Z87891 Personal history of nicotine dependence: Secondary | ICD-10-CM | POA: Diagnosis not present

## 2022-05-24 ENCOUNTER — Encounter: Payer: Self-pay | Admitting: Internal Medicine

## 2022-05-25 ENCOUNTER — Ambulatory Visit: Payer: Medicare Other | Admitting: Internal Medicine

## 2022-05-31 ENCOUNTER — Ambulatory Visit (INDEPENDENT_AMBULATORY_CARE_PROVIDER_SITE_OTHER): Payer: Medicare Other

## 2022-05-31 ENCOUNTER — Ambulatory Visit (INDEPENDENT_AMBULATORY_CARE_PROVIDER_SITE_OTHER): Payer: Medicare Other | Admitting: Internal Medicine

## 2022-05-31 VITALS — BP 120/66 | HR 137 | Temp 98.0°F | Ht 60.0 in | Wt 225.0 lb

## 2022-05-31 DIAGNOSIS — R059 Cough, unspecified: Secondary | ICD-10-CM | POA: Diagnosis not present

## 2022-05-31 DIAGNOSIS — J9611 Chronic respiratory failure with hypoxia: Secondary | ICD-10-CM | POA: Diagnosis not present

## 2022-05-31 DIAGNOSIS — R062 Wheezing: Secondary | ICD-10-CM | POA: Diagnosis not present

## 2022-05-31 DIAGNOSIS — R8290 Unspecified abnormal findings in urine: Secondary | ICD-10-CM | POA: Diagnosis not present

## 2022-05-31 DIAGNOSIS — Z23 Encounter for immunization: Secondary | ICD-10-CM | POA: Diagnosis not present

## 2022-05-31 LAB — URINALYSIS, ROUTINE W REFLEX MICROSCOPIC
Ketones, ur: NEGATIVE
Nitrite: NEGATIVE
Specific Gravity, Urine: 1.02 (ref 1.000–1.030)
Total Protein, Urine: NEGATIVE
Urine Glucose: NEGATIVE
Urobilinogen, UA: 1 (ref 0.0–1.0)
pH: 6 (ref 5.0–8.0)

## 2022-05-31 MED ORDER — METHYLPREDNISOLONE ACETATE 80 MG/ML IJ SUSP
80.0000 mg | Freq: Once | INTRAMUSCULAR | Status: AC
  Administered 2022-05-31: 80 mg via INTRAMUSCULAR

## 2022-05-31 MED ORDER — PREDNISONE 10 MG PO TABS: ORAL_TABLET | ORAL | 0 refills | Status: DC

## 2022-05-31 MED ORDER — HYDROCODONE BIT-HOMATROP MBR 5-1.5 MG/5ML PO SOLN: 5.0000 mL | Freq: Four times a day (QID) | ORAL | 0 refills | Status: AC | PRN

## 2022-05-31 MED ORDER — LEVOFLOXACIN 500 MG PO TABS: 500.0000 mg | ORAL_TABLET | Freq: Every day | ORAL | 0 refills | Status: AC

## 2022-05-31 NOTE — Patient Instructions (Addendum)
Please take all new medication as prescribed - the antibiotic, cough medicine, prednisone, and continue your inhalers  You had the steroid shot today, and the Flu Shot today  Please continue all other medications as before, and refills have been done if requested.  Please have the pharmacy call with any other refills you may need.  Please keep your appointments with your specialists as you may have planned  Please go to the XRAY Department in the first floor for the x-ray testing  Please go to the LAB at the blood drawing area for the tests to be done - just the urine testing today  You will be contacted by phone if any changes need to be made immediately.  Otherwise, you will receive a letter about your results with an explanation, but please check with MyChart first.  Please remember to sign up for MyChart if you have not done so, as this will be important to you in the future with finding out test results, communicating by private email, and scheduling acute appointments online when needed.  Please make an Appointment to return in Jun 29, 2022, or sooner if needed

## 2022-05-31 NOTE — Progress Notes (Unsigned)
Patient ID: Kathy [name], female   DOB: [birth year], 81 y.o.   MRN: [id number]        Chief Complaint: follow up UTI, new productive cough, off smelling urine       HPI:  Kathy [name] is a 81 y.o. female here but a note from the daughter explains they may be poor historians - she has previously sent up a message on mychart about the events.Kathy [name] was taken to ER in [place] on Sat Nov 25 because she was dehydrated. Seeing things. Very weak and confused.  Blood pressure high. This started on Wednesday Nov 22 and has not gotten any better. If anything worse.  She was treated for UTI but also was complaining of chest pains and has for a week but told no one. ER wanted her to have follow up with you. She has been having this happen for a while. Confusion. Can't hold her head up. Weakness.  Chest pains  She is still dehydrated because they couldn't access her veins anywhere to give her IV fluids and she is also not drinking enough water. Coffee. Soft drinks but very little water. Attached are papers from ER visit with labs. They did CT of head but it was clear. When I looked at her meds :  Is she still on Eloquis? Shows she needs refill request if so.  Thanks  Ane Conerly      Pt was tx with cefdinir course.  Attachments to mychart indiicate stable ABG, cbc, but UA with pyuria.  Flu neg.  Troponin normal, covid negative, cxr - NAD, CMET with cr 1.45 (baseline has been 1.2 - 1.4).  CT head  - meg.  Pt states antibx ,may not have helped since her urine still smells strong.       Also c/o 1 wk or more of intermittent SSCP chest pain also with scant prod cough.  No radiation, not assoc with increasing sob, diaphoresis, palps or syncope.  Worse to lie down at night.     Took TUMS one night, and seemed better.    Wt Readings from Last 3 Encounters:  05/31/22 225 lb (102.1 kg)  02/09/22 225 lb (102.1 kg)  02/09/22 225 lb (102.1 kg)   BP Readings from Last 3 Encounters:  05/31/22 120/66  02/09/22 112/60   02/09/22 112/60         Past Medical History:  Diagnosis Date   Arthritis    fingers   CKD (chronic [name] disease) stage 3, GFR 30-59 ml/min (HCC) 11/30/2017   Depression    Dizziness    in AM, getting out of bed   Dysrhythmia    A fib   Endometrial ca ([place]) 11/30/2017   S/p surgury 1990's   GERD (gastroesophageal reflux disease) 11/30/2017   HLD (hyperlipidemia) 11/30/2017   Hypercholesteremia    Hypertension    Interstitial lung disease ([place])    Neuropathy    bilateral feet   Pneumonia 12/14/2021   Shortness of breath dyspnea    Sleep apnea    has CPAP, doesn't use   Umbilical hernia    Past Surgical History:  Procedure Laterality Date   ABDOMINAL HYSTERECTOMY     BROW LIFT Bilateral 07/15/2015   Procedure: BLEPHAROPLASTY;  Surgeon: [name] Starch, MD;  Location: [facility];  Service: Ophthalmology;  Laterality: Bilateral;   CATARACT EXTRACTION W/PHACO Right 12/31/2021   Procedure: CATARACT EXTRACTION PHACO AND INTRAOCULAR LENS PLACEMENT ([place]) RIGHT;  Surgeon: Norvel Richards, MD;  Location: [facility];  Service: Ophthalmology;  Laterality: Right;  17.11 1:46.1   CHOLECYSTECTOMY     HAMMER TOE SURGERY     HERNIA REPAIR     KNEE ARTHROSCOPY Bilateral    PTOSIS REPAIR Bilateral 07/15/2015   Procedure: PTOSIS REPAIR;  Surgeon: [name] Starch, MD;  Location: [facility];  Service: Ophthalmology;  Laterality: Bilateral;  CPAP   TONSILLECTOMY      reports that she quit smoking about 35 years ago. Her smoking use included cigarettes. She has a 10.00 pack-year smoking history. She has never used smokeless tobacco. She reports that she does not drink alcohol and does not use drugs. family history includes Congestive Heart Failure in her mother; Diabetes in her son; Parkinson's disease in her father; Stroke in her father. Allergies  Allergen Reactions   Lipitor [Atorvastatin] Other (See Comments)    Memory issues   Requip [Ropinirole Hcl] Other  (See Comments)    Pt reports feeling generally unwell on this medication   Current Outpatient Medications on File Prior to Visit  Medication Sig Dispense Refill   albuterol (VENTOLIN HFA) 108 (90 Base) MCG/ACT inhaler Inhale 1-2 puffs into the lungs every 6 (six) hours as needed for wheezing or shortness of breath. 1 each 11   amitriptyline (ELAVIL) 100 MG tablet TAKE 1/2 - 1 TABLET BY MOUTH AT BEDTIME FOR SLEEP AND FEET PAIN (Patient taking differently: Take 50-100 mg by mouth at bedtime.) 90 tablet 1   apixaban (ELIQUIS) 5 MG TABS tablet Take 1 tablet (5 mg total) by mouth 2 (two) times daily. 180 tablet 3   Cholecalciferol (VITAMIN D-3) 25 MCG (1000 UT) CAPS Take 1 capsule by mouth daily.     citalopram (CELEXA) 10 MG tablet Take 1 tablet (10 mg total) by mouth daily. 90 tablet 3   cyanocobalamin (VITAMIN B12) 1000 MCG/ML injection INJECT 1 ML INTO MUSCLE EVERY 30 DAYS 3 mL 1   diltiazem (CARDIZEM) 120 MG tablet Take 120 mg by mouth daily.     fenofibrate micronized (LOFIBRA) 134 MG capsule TAKE 1 CAPSULE BY MOUTH EVERY DAY IN THE MORNING BEFORE BREAKFAST 90 capsule 3   furosemide (LASIX) 40 MG tablet Take 1 tablet (40 mg total) by mouth every other day. 1 tab by mouth in the AM, and 1 tab by mouth in the PM as needed for persistent swelling or weight gain more than 3-5 lbs 60 tablet 11   losartan (COZAAR) 100 MG tablet TAKE 1/2 (ONE HALF) TABLET BY MOUTH ONCE DAILY 45 tablet 3   meloxicam (MOBIC) 15 MG tablet Take 15 mg by mouth daily.     metoprolol succinate (TOPROL-XL) 50 MG 24 hr tablet TAKE 1 TABLET BY MOUTH DAILY. TAKE WITH OR IMMEDIATELY FOLLOWING A MEAL. 90 tablet 3   pantoprazole (PROTONIX) 40 MG tablet TAKE 1 TABLET BY MOUTH EVERY DAY 90 tablet 3   Respiratory Therapy Supplies (FLUTTER) DEVI Use as directed 1 each 0   STUDY - ASPIRE - apixaban 5 mg or placebo tablet (PI-Sethi) Per instructions TWICE A DAY (route: oral)     No current facility-administered medications on file prior  to visit.        ROS:  All others reviewed and negative.  Objective        PE:  BP 120/66 (BP Location: Left Arm, Patient Position: Sitting, Cuff Size: Large)   Pulse (!) 137   Temp 98 F (36.7 C) (Oral)   Ht 5' (1.524 m)  Wt 225 lb (102.1 kg)   SpO2 95%   BMI 43.94 kg/m                 Constitutional: Pt appears in NAD, mild ill appearing               HENT: Head: NCAT.                Right Ear: External ear normal.                 Left Ear: External ear normal.  Bilat tm's with mild erythema.  Max sinus areas non tender.  Pharynx with mild erythema, no exudate                Eyes: . Pupils are equal, round, and reactive to light. Conjunctivae and EOM are normal               Nose: without d/c or deformity               Neck: Neck supple. Gross normal ROM               Cardiovascular: Normal rate and regular rhythm.                 Pulmonary/Chest: Effort normal and breath sounds decreased without rales and slight bilateral wheezing.                Abd:  Soft, NT, ND, + BS, no organomegaly               Neurological: Pt is alert. At baseline orientation, motor grossly intact               Skin: Skin is warm. No rashes, no other new lesions, LE edema - none               Psychiatric: Pt behavior is normal without agitation   Micro: none  Cardiac tracings I have personally interpreted today:  none  Pertinent Radiological findings (summarize): none   Lab Results  Component Value Date   WBC 6.7 02/03/2022   HGB 12.5 02/03/2022   HCT 36.8 02/03/2022   PLT 193.0 02/03/2022   GLUCOSE 120 (H) 12/11/2021   CHOL 152 09/22/2021   TRIG 92.0 09/22/2021   HDL 56.30 09/22/2021   LDLCALC 77 09/22/2021   ALT 10 12/11/2021   AST 20 12/11/2021   NA 139 12/11/2021   K 4.0 12/11/2021   CL 96 12/11/2021   CREATININE 2.12 (H) 12/11/2021   BUN 35 (H) 12/11/2021   CO2 32 12/11/2021   TSH 2.44 09/22/2021   HGBA1C 5.8 09/22/2021   Assessment/Plan:  Kathy [name] is a 82 y.o. White or  Caucasian [1] female with  has a past medical history of Arthritis, CKD (chronic [name] disease) stage 3, GFR 30-59 ml/min (HCC) (11/30/2017), Depression, Dizziness, Dysrhythmia, Endometrial ca (HCC) (11/30/2017), GERD (gastroesophageal reflux disease) (11/30/2017), HLD (hyperlipidemia) (11/30/2017), Hypercholesteremia, Hypertension, Interstitial lung disease ([place]), Neuropathy, Pneumonia (12/14/2021), Shortness of breath dyspnea, Sleep apnea, and Umbilical hernia.  Chronic respiratory failure with hypoxia (HCC) Overall stable, pt to cont Home o2 asd  Foul smelling urine S/p recent tx for UTI at ED Nov 25, now much improved but still with off smelling urine - for f/u UA and culture today  Cough More recent onset after ED visit, for cxr but also empiric tx for pna with levaquiin 500 mg course, cough med prn  Wheezing Very mild, for depomedrol 80  mg IM, prednisone taper  Followup: Return in about 29 days (around 06/29/2022).  Cathlean Cower, MD 06/01/2022 1:11 PM [facility]

## 2022-06-01 ENCOUNTER — Encounter: Payer: Self-pay | Admitting: Internal Medicine

## 2022-06-01 NOTE — Assessment & Plan Note (Signed)
Overall stable, pt to cont Home o2 asd

## 2022-06-01 NOTE — Assessment & Plan Note (Addendum)
S/p recent tx for UTI at ED Nov 25, now much improved but still with off smelling urine - for f/u UA and culture today

## 2022-06-01 NOTE — Assessment & Plan Note (Signed)
Very mild, for depomedrol 80 mg IM, prednisone taper

## 2022-06-01 NOTE — Assessment & Plan Note (Signed)
More recent onset after ED visit, for cxr but also empiric tx for pna with levaquiin 500 mg course, cough med prn

## 2022-06-03 ENCOUNTER — Other Ambulatory Visit: Payer: Self-pay | Admitting: Internal Medicine

## 2022-06-03 LAB — URINE CULTURE

## 2022-06-03 MED ORDER — NITROFURANTOIN MONOHYD MACRO 100 MG PO CAPS: 100.0000 mg | ORAL_CAPSULE | Freq: Two times a day (BID) | ORAL | 0 refills | Status: DC

## 2022-06-29 ENCOUNTER — Ambulatory Visit: Payer: Medicare Other | Admitting: Internal Medicine

## 2022-06-29 ENCOUNTER — Ambulatory Visit (INDEPENDENT_AMBULATORY_CARE_PROVIDER_SITE_OTHER): Payer: Medicare Other | Admitting: Internal Medicine

## 2022-06-29 ENCOUNTER — Encounter: Payer: Self-pay | Admitting: Internal Medicine

## 2022-06-29 VITALS — BP 126/64 | HR 85 | Temp 97.7°F | Ht 60.0 in | Wt 224.0 lb

## 2022-06-29 DIAGNOSIS — E559 Vitamin D deficiency, unspecified: Secondary | ICD-10-CM | POA: Diagnosis not present

## 2022-06-29 DIAGNOSIS — R21 Rash and other nonspecific skin eruption: Secondary | ICD-10-CM | POA: Diagnosis not present

## 2022-06-29 DIAGNOSIS — N1831 Chronic kidney disease, stage 3a: Secondary | ICD-10-CM

## 2022-06-29 DIAGNOSIS — E538 Deficiency of other specified B group vitamins: Secondary | ICD-10-CM

## 2022-06-29 DIAGNOSIS — I10 Essential (primary) hypertension: Secondary | ICD-10-CM

## 2022-06-29 DIAGNOSIS — R739 Hyperglycemia, unspecified: Secondary | ICD-10-CM

## 2022-06-29 MED ORDER — TRIAMCINOLONE ACETONIDE 0.5 % EX CREA
1.0000 | TOPICAL_CREAM | Freq: Two times a day (BID) | CUTANEOUS | 1 refills | Status: DC | PRN
Start: 2022-06-29 — End: 2023-04-18

## 2022-06-29 NOTE — Assessment & Plan Note (Signed)
Lab Results  Component Value Date   HGBA1C 5.8 09/22/2021   Stable, pt to continue current medical treatment - diet, wt control, excercise

## 2022-06-29 NOTE — Assessment & Plan Note (Signed)
Last vitamin D Lab Results  Component Value Date   VD25OH 42.31 09/22/2021   Stable, cont oral replacement

## 2022-06-29 NOTE — Progress Notes (Signed)
Patient ID: Kathy [name], female   DOB: [birth year], 82 y.o.   MRN: [id number]        Chief Complaint: follow up HTN, HLD and hyperglycemia , rash       HPI:  Kathy [name] is a 82 y.o. female here with husband, overall doing ok today on chronic home o2, Pt denies chest pain, increased sob or doe, wheezing, orthopnea, PND, increased LE swelling, palpitations, dizziness or syncope.   Pt denies polydipsia, polyuria, or new focal neuro s/s.    Pt denies fever, wt loss, night sweats, loss of appetite, or other constitutional symptoms  Does have worsening eczemtous itchy rash to legs, asking for topical steroid.         Wt Readings from Last 3 Encounters:  06/29/22 224 lb (101.6 kg)  05/31/22 225 lb (102.1 kg)  02/09/22 225 lb (102.1 kg)   BP Readings from Last 3 Encounters:  06/29/22 126/64  05/31/22 120/66  02/09/22 112/60         Past Medical History:  Diagnosis Date   Arthritis    fingers   CKD (chronic kidney disease) stage 3, GFR 30-59 ml/min (HCC) 11/30/2017   Depression    Dizziness    in AM, getting out of bed   Dysrhythmia    A fib   Endometrial ca ([place]) 11/30/2017   S/p surgury 1990's   GERD (gastroesophageal reflux disease) 11/30/2017   HLD (hyperlipidemia) 11/30/2017   Hypercholesteremia    Hypertension    Interstitial lung disease ([place])    Neuropathy    bilateral feet   Pneumonia 12/14/2021   Shortness of breath dyspnea    Sleep apnea    has CPAP, doesn't use   Umbilical hernia    Past Surgical History:  Procedure Laterality Date   ABDOMINAL HYSTERECTOMY     BROW LIFT Bilateral 07/15/2015   Procedure: BLEPHAROPLASTY;  Surgeon: Karle Starch, MD;  Location: [facility];  Service: Ophthalmology;  Laterality: Bilateral;   CATARACT EXTRACTION W/PHACO Right 12/31/2021   Procedure: CATARACT EXTRACTION PHACO AND INTRAOCULAR LENS PLACEMENT ([place]) RIGHT;  Surgeon: Norvel Richards, MD;  Location: [facility];  Service: Ophthalmology;  Laterality: Right;   17.11 1:46.1   CHOLECYSTECTOMY     HAMMER TOE SURGERY     HERNIA REPAIR     KNEE ARTHROSCOPY Bilateral    PTOSIS REPAIR Bilateral 07/15/2015   Procedure: PTOSIS REPAIR;  Surgeon: Karle Starch, MD;  Location: [facility];  Service: Ophthalmology;  Laterality: Bilateral;  CPAP   TONSILLECTOMY      reports that she quit smoking about 36 years ago. Her smoking use included cigarettes. She has a 10.00 pack-year smoking history. She has never used smokeless tobacco. She reports that she does not drink alcohol and does not use drugs. family history includes Congestive Heart Failure in her mother; Diabetes in her son; Parkinson's disease in her father; Stroke in her father. Allergies  Allergen Reactions   Lipitor [Atorvastatin] Other (See Comments)    Memory issues   Requip [Ropinirole Hcl] Other (See Comments)    Pt reports feeling generally unwell on this medication   Current Outpatient Medications on File Prior to Visit  Medication Sig Dispense Refill   albuterol (PROVENTIL HFA) 108 (90 Base) MCG/ACT inhaler 1-2 puff EVERY 6 HOURS (route: inhalation)     albuterol (VENTOLIN HFA) 108 (90 Base) MCG/ACT inhaler Inhale 1-2 puffs into the lungs every 6 (six) hours as needed for wheezing or shortness of breath.  1 each 11   amitriptyline (ELAVIL) 100 MG tablet TAKE 1/2 - 1 TABLET BY MOUTH AT BEDTIME FOR SLEEP AND FEET PAIN (Patient taking differently: Take 50-100 mg by mouth at bedtime.) 90 tablet 1   apixaban (ELIQUIS) 5 MG TABS tablet Take 1 tablet (5 mg total) by mouth 2 (two) times daily. 180 tablet 3   cefdinir (OMNICEF) 300 MG capsule Take 300 mg by mouth every 12 (twelve) hours.     Cholecalciferol (VITAMIN D-3) 25 MCG (1000 UT) CAPS Take 1 capsule by mouth daily.     citalopram (CELEXA) 10 MG tablet Take 1 tablet (10 mg total) by mouth daily. 90 tablet 3   cyanocobalamin (VITAMIN B12) 1000 MCG/ML injection INJECT 1 ML INTO MUSCLE EVERY 30 DAYS 3 mL 1   diltiazem (CARDIZEM) 120 MG  tablet Take 120 mg by mouth daily.     fenofibrate micronized (LOFIBRA) 134 MG capsule TAKE 1 CAPSULE BY MOUTH EVERY DAY IN THE MORNING BEFORE BREAKFAST 90 capsule 3   furosemide (LASIX) 40 MG tablet Take 1 tablet (40 mg total) by mouth every other day. 1 tab by mouth in the AM, and 1 tab by mouth in the PM as needed for persistent swelling or weight gain more than 3-5 lbs 60 tablet 11   losartan (COZAAR) 100 MG tablet TAKE 1/2 (ONE HALF) TABLET BY MOUTH ONCE DAILY 45 tablet 3   meloxicam (MOBIC) 15 MG tablet Take 15 mg by mouth daily.     metoprolol succinate (TOPROL-XL) 50 MG 24 hr tablet TAKE 1 TABLET BY MOUTH DAILY. TAKE WITH OR IMMEDIATELY FOLLOWING A MEAL. 90 tablet 3   nitrofurantoin, macrocrystal-monohydrate, (MACROBID) 100 MG capsule Take 1 capsule (100 mg total) by mouth 2 (two) times daily. 20 capsule 0   pantoprazole (PROTONIX) 40 MG tablet TAKE 1 TABLET BY MOUTH EVERY DAY 90 tablet 3   predniSONE (DELTASONE) 10 MG tablet 3 tabs by mouth per day for 3 days,2tabs per day for 3 days,1tab per day for 3 days 18 tablet 0   Respiratory Therapy Supplies (FLUTTER) DEVI Use as directed 1 each 0   STUDY - ASPIRE - apixaban 5 mg or placebo tablet (PI-Sethi) Per instructions TWICE A DAY (route: oral)     No current facility-administered medications on file prior to visit.        ROS:  All others reviewed and negative.  Objective        PE:  BP 126/64 (BP Location: Left Arm, Patient Position: Sitting, Cuff Size: Large)   Pulse 85   Temp 97.7 F (36.5 C) (Oral)   Ht 5' (1.524 m)   Wt 224 lb (101.6 kg)   SpO2 98%   BMI 43.75 kg/m                 Constitutional: Pt appears in NAD               HENT: Head: NCAT.                Right Ear: External ear normal.                 Left Ear: External ear normal.                Eyes: . Pupils are equal, round, and reactive to light. Conjunctivae and EOM are normal               Nose: without d/c or deformity  Neck: Neck supple. Gross  normal ROM               Cardiovascular: Normal rate and regular rhythm.                 Pulmonary/Chest: Effort normal and breath sounds without rales or wheezing.                Abd:  Soft, NT, ND, + BS, no organomegaly               Neurological: Pt is alert. At baseline orientation, motor grossly intact               Skin: LE edema - none, diffuse mild nontender erythema to bialteral distal anterior legs               Psychiatric: Pt behavior is normal without agitation   Micro: none  Cardiac tracings I have personally interpreted today:  none  Pertinent Radiological findings (summarize): none   Lab Results  Component Value Date   WBC 6.7 02/03/2022   HGB 12.5 02/03/2022   HCT 36.8 02/03/2022   PLT 193.0 02/03/2022   GLUCOSE 120 (H) 12/11/2021   CHOL 152 09/22/2021   TRIG 92.0 09/22/2021   HDL 56.30 09/22/2021   LDLCALC 77 09/22/2021   ALT 10 12/11/2021   AST 20 12/11/2021   NA 139 12/11/2021   K 4.0 12/11/2021   CL 96 12/11/2021   CREATININE 2.12 (H) 12/11/2021   BUN 35 (H) 12/11/2021   CO2 32 12/11/2021   TSH 2.44 09/22/2021   HGBA1C 5.8 09/22/2021   Assessment/Plan:  Rexine Gowens is a 82 y.o. White or Caucasian [1] female with  has a past medical history of Arthritis, CKD (chronic kidney disease) stage 3, GFR 30-59 ml/min (HCC) (11/30/2017), Depression, Dizziness, Dysrhythmia, Endometrial ca (HCC) (11/30/2017), GERD (gastroesophageal reflux disease) (11/30/2017), HLD (hyperlipidemia) (11/30/2017), Hypercholesteremia, Hypertension, Interstitial lung disease ([place]), Neuropathy, Pneumonia (12/14/2021), Shortness of breath dyspnea, Sleep apnea, and Umbilical hernia.  B12 deficiency Lab Results  Component Value Date   VITAMINB12 356 09/22/2021   Stable, cont oral replacement - b12 1000 mcg qd  CKD (chronic kidney disease) stage 3, GFR 30-59 ml/min (HCC) Lab Results  Component Value Date   CREATININE 2.12 (H) 12/11/2021   Stable overall, cont to avoid  nephrotoxins   Essential hypertension BP Readings from Last 3 Encounters:  06/29/22 126/64  05/31/22 120/66  02/09/22 112/60   Stable, pt to continue medical treatment toprol xl 50 qd, losartan 50 qd, card CD 120 qd   Hyperglycemia Lab Results  Component Value Date   HGBA1C 5.8 09/22/2021   Stable, pt to continue current medical treatment - diet, wt control, excercise   Vitamin D deficiency Last vitamin D Lab Results  Component Value Date   VD25OH 42.31 09/22/2021   Stable, cont oral replacement   Rash Recent worwsening, for triam cr prn asd,  to f/u any worsening symptoms or concerns  Followup: Return in about 6 months (around 12/28/2022).  Cathlean Cower, MD 06/29/2022 7:38 PM [facility]

## 2022-06-29 NOTE — Assessment & Plan Note (Signed)
Lab Results  Component Value Date   CREATININE 2.12 (H) 12/11/2021   Stable overall, cont to avoid nephrotoxins

## 2022-06-29 NOTE — Assessment & Plan Note (Signed)
Recent worwsening, for triam cr prn asd,  to f/u any worsening symptoms or concerns

## 2022-06-29 NOTE — Assessment & Plan Note (Signed)
BP Readings from Last 3 Encounters:  06/29/22 126/64  05/31/22 120/66  02/09/22 112/60   Stable, pt to continue medical treatment toprol xl 50 qd, losartan 50 qd, card CD 120 qd

## 2022-06-29 NOTE — Patient Instructions (Addendum)
Please have your Shingrix (shingles) shots done at your local pharmacy.  Please take all new medication as prescribed  - the steroid cream  Please continue all other medications as before, and refills have been done if requested.  Please have the pharmacy call with any other refills you may need.  Please continue your efforts at being more active, low cholesterol diet, and weight control.  You are otherwise up to date with prevention measures today.  Please keep your appointments with your specialists as you may have planned  Please go to the LAB at the blood drawing area for the tests to be done  You will be contacted by phone if any changes need to be made immediately.  Otherwise, you will receive a letter about your results with an explanation, but please check with MyChart first.  Please make an Appointment to return in 6 months, or sooner if needed

## 2022-06-29 NOTE — Assessment & Plan Note (Signed)
Lab Results  Component Value Date   VITAMINB12 356 09/22/2021   Stable, cont oral replacement - b12 1000 mcg qd

## 2022-07-05 LAB — URINALYSIS, ROUTINE W REFLEX MICROSCOPIC
Bilirubin Urine: NEGATIVE
Ketones, ur: NEGATIVE
Nitrite: NEGATIVE
Specific Gravity, Urine: 1.025 (ref 1.000–1.030)
Total Protein, Urine: NEGATIVE
Urine Glucose: NEGATIVE
Urobilinogen, UA: 2 — AB (ref 0.0–1.0)
pH: 6 (ref 5.0–8.0)

## 2022-07-05 LAB — BASIC METABOLIC PANEL
BUN: 23 mg/dL (ref 6–23)
CO2: 30 mEq/L (ref 19–32)
Calcium: 9.6 mg/dL (ref 8.4–10.5)
Chloride: 102 mEq/L (ref 96–112)
Creatinine, Ser: 1.08 mg/dL (ref 0.40–1.20)
GFR: 48.16 mL/min — ABNORMAL LOW (ref >60.00)
Glucose, Bld: 97 mg/dL (ref 70–99)
Potassium: 4.4 mEq/L (ref 3.5–5.1)
Sodium: 140 mEq/L (ref 135–145)

## 2022-07-05 LAB — CBC WITH DIFFERENTIAL/PLATELET
Basophils Absolute: 0 10*3/uL (ref 0.0–0.1)
Basophils Relative: 0.5 % (ref 0.0–3.0)
Eosinophils Absolute: 0.2 10*3/uL (ref 0.0–0.7)
Eosinophils Relative: 2.5 % (ref 0.0–5.0)
HCT: 38.4 % (ref 36.0–46.0)
Hemoglobin: 12.9 g/dL (ref 12.0–15.0)
Lymphocytes Relative: 22.3 % (ref 12.0–46.0)
Lymphs Abs: 1.7 10*3/uL (ref 0.7–4.0)
MCHC: 33.6 g/dL (ref 30.0–36.0)
MCV: 97.8 fl (ref 78.0–100.0)
Monocytes Absolute: 0.6 10*3/uL (ref 0.1–1.0)
Monocytes Relative: 8.1 % (ref 3.0–12.0)
Neutro Abs: 5 10*3/uL (ref 1.4–7.7)
Neutrophils Relative %: 66.6 % (ref 43.0–77.0)
Platelets: 248 10*3/uL (ref 150.0–400.0)
RBC: 3.93 Mil/uL (ref 3.87–5.11)
RDW: 13.8 % (ref 11.5–15.5)
WBC: 7.6 10*3/uL (ref 4.0–10.5)

## 2022-07-05 LAB — HEPATIC FUNCTION PANEL
ALT: 11 U/L (ref 0–35)
AST: 25 U/L (ref 0–37)
Albumin: 3.8 g/dL (ref 3.5–5.2)
Alkaline Phosphatase: 45 U/L (ref 39–117)
Bilirubin, Direct: 0.1 mg/dL (ref 0.0–0.3)
Total Bilirubin: 0.6 mg/dL (ref 0.2–1.2)
Total Protein: 7.5 g/dL (ref 6.0–8.3)

## 2022-07-05 LAB — LIPID PANEL
Cholesterol: 149 mg/dL (ref 0–200)
HDL: 55.9 mg/dL (ref >39.00)
LDL Cholesterol: 78 mg/dL (ref 0–99)
NonHDL: 92.97
Total CHOL/HDL Ratio: 3
Triglycerides: 77 mg/dL (ref 0.0–149.0)
VLDL: 15.4 mg/dL (ref 0.0–40.0)

## 2022-07-05 LAB — TSH: TSH: 2.17 u[IU]/mL (ref 0.35–5.50)

## 2022-07-05 LAB — VITAMIN B12: Vitamin B-12: 212 pg/mL (ref 211–911)

## 2022-07-05 LAB — VITAMIN D 25 HYDROXY (VIT D DEFICIENCY, FRACTURES): VITD: 41.53 ng/mL (ref 30.00–100.00)

## 2022-07-05 LAB — HEMOGLOBIN A1C: Hgb A1c MFr Bld: 5.8 % (ref 4.6–6.5)

## 2022-07-06 LAB — MICROALBUMIN / CREATININE URINE RATIO
Creatinine,U: 86.8 mg/dL
Microalb Creat Ratio: 1.9 mg/g (ref 0.0–30.0)
Microalb, Ur: 1.7 mg/dL (ref 0.0–1.9)

## 2022-08-02 ENCOUNTER — Other Ambulatory Visit: Payer: Self-pay | Admitting: *Deleted

## 2022-08-02 ENCOUNTER — Encounter: Payer: Self-pay | Admitting: Internal Medicine

## 2022-08-02 MED ORDER — MELOXICAM 15 MG PO TABS: 15.0000 mg | ORAL_TABLET | Freq: Every day | ORAL | 2 refills | Status: DC

## 2022-08-02 NOTE — Telephone Encounter (Signed)
FYI for upcoming visit

## 2022-08-02 NOTE — Progress Notes (Signed)
Patient ID: Kathy [name], female   DOB: Sep 05, 1940, 82 y.o.   MRN: [id number] Kathy [name] this is done

## 2022-08-02 NOTE — Progress Notes (Signed)
Pt requested refill on Meloxicam at [facility] This was entered in as historical med in 11/2021. Labs were done in JAN 2024 and BMP was normal  I have set up and pended - please address if this is ok?

## 2022-08-02 NOTE — Progress Notes (Signed)
See below

## 2022-08-03 ENCOUNTER — Ambulatory Visit (INDEPENDENT_AMBULATORY_CARE_PROVIDER_SITE_OTHER): Payer: Medicare Other | Admitting: Internal Medicine

## 2022-08-03 VITALS — BP 126/70 | HR 55 | Temp 97.5°F | Ht 60.0 in | Wt 224.0 lb

## 2022-08-03 DIAGNOSIS — R059 Cough, unspecified: Secondary | ICD-10-CM

## 2022-08-03 DIAGNOSIS — J9611 Chronic respiratory failure with hypoxia: Secondary | ICD-10-CM

## 2022-08-03 DIAGNOSIS — R410 Disorientation, unspecified: Secondary | ICD-10-CM | POA: Insufficient documentation

## 2022-08-03 DIAGNOSIS — M25561 Pain in right knee: Secondary | ICD-10-CM | POA: Diagnosis not present

## 2022-08-03 DIAGNOSIS — E559 Vitamin D deficiency, unspecified: Secondary | ICD-10-CM | POA: Diagnosis not present

## 2022-08-03 DIAGNOSIS — F32A Depression, unspecified: Secondary | ICD-10-CM | POA: Diagnosis not present

## 2022-08-03 DIAGNOSIS — F323 Major depressive disorder, single episode, severe with psychotic features: Secondary | ICD-10-CM

## 2022-08-03 DIAGNOSIS — G8929 Other chronic pain: Secondary | ICD-10-CM

## 2022-08-03 DIAGNOSIS — M25562 Pain in left knee: Secondary | ICD-10-CM | POA: Diagnosis not present

## 2022-08-03 DIAGNOSIS — R4182 Altered mental status, unspecified: Secondary | ICD-10-CM

## 2022-08-03 LAB — CBC WITH DIFFERENTIAL/PLATELET
Basophils Absolute: 0 10*3/uL (ref 0.0–0.1)
Basophils Relative: 0.4 % (ref 0.0–3.0)
Eosinophils Absolute: 0.2 10*3/uL (ref 0.0–0.7)
Eosinophils Relative: 2.7 % (ref 0.0–5.0)
HCT: 36 % (ref 36.0–46.0)
Hemoglobin: 12 g/dL (ref 12.0–15.0)
Lymphocytes Relative: 14.4 % (ref 12.0–46.0)
Lymphs Abs: 1.2 10*3/uL (ref 0.7–4.0)
MCHC: 33.3 g/dL (ref 30.0–36.0)
MCV: 98.1 fl (ref 78.0–100.0)
Monocytes Absolute: 0.5 10*3/uL (ref 0.1–1.0)
Monocytes Relative: 6.3 % (ref 3.0–12.0)
Neutro Abs: 6.4 10*3/uL (ref 1.4–7.7)
Neutrophils Relative %: 76.2 % (ref 43.0–77.0)
Platelets: 250 10*3/uL (ref 150.0–400.0)
RBC: 3.66 Mil/uL — ABNORMAL LOW (ref 3.87–5.11)
RDW: 14 % (ref 11.5–15.5)
WBC: 8.4 10*3/uL (ref 4.0–10.5)

## 2022-08-03 LAB — URINALYSIS, ROUTINE W REFLEX MICROSCOPIC
Nitrite: NEGATIVE
Specific Gravity, Urine: 1.025 (ref 1.000–1.030)
Urine Glucose: NEGATIVE
Urobilinogen, UA: 4 — AB (ref 0.0–1.0)
pH: 6 (ref 5.0–8.0)

## 2022-08-03 LAB — HEPATIC FUNCTION PANEL
ALT: 13 U/L (ref 0–35)
AST: 24 U/L (ref 0–37)
Albumin: 3.9 g/dL (ref 3.5–5.2)
Alkaline Phosphatase: 46 U/L (ref 39–117)
Bilirubin, Direct: 0.2 mg/dL (ref 0.0–0.3)
Total Bilirubin: 0.6 mg/dL (ref 0.2–1.2)
Total Protein: 7.9 g/dL (ref 6.0–8.3)

## 2022-08-03 LAB — BASIC METABOLIC PANEL
BUN: 24 mg/dL — ABNORMAL HIGH (ref 6–23)
CO2: 33 mEq/L — ABNORMAL HIGH (ref 19–32)
Calcium: 9.7 mg/dL (ref 8.4–10.5)
Chloride: 99 mEq/L (ref 96–112)
Creatinine, Ser: 1.29 mg/dL — ABNORMAL HIGH (ref 0.40–1.20)
GFR: 38.89 mL/min — ABNORMAL LOW (ref >60.00)
Glucose, Bld: 146 mg/dL — ABNORMAL HIGH (ref 70–99)
Potassium: 4.3 mEq/L (ref 3.5–5.1)
Sodium: 142 mEq/L (ref 135–145)

## 2022-08-03 NOTE — Patient Instructions (Addendum)
Please continue all other medications as before, and refills have been done if requested.  Please have the pharmacy call with any other refills you may need.  Please continue your efforts at being more active, low cholesterol diet, and weight control.  Please keep your appointments with your specialists as you may have planned  You will be contacted regarding the referral for: Sports Medicine for the knees, MRI for the brain, and Psychiatry for the depression and confusion  Please go to the XRAY Department in the first floor for the x-ray testing  Please go to the LAB at the blood drawing area for the tests to be done - the urine and blood testing today  Please make an Appointment to return in 4 months, or sooner if needed

## 2022-08-03 NOTE — Progress Notes (Signed)
Patient ID: Kathy [name], female   DOB: Oct 14, 1940, 82 y.o.   MRN: [id number]        Chief Complaint: follow up dry mouth, confusion and memory changes, bilat knee pain, depression       HPI:  Kathy [name] is a 82 y.o. female here with worsening depression with low mood and anhedonia, but also worsening confusion and appearing to think she is in different locations than she actually is; family states she does not drink enough fluids, has worsening dry mouth, and certainly not made any better by home o2 2L continous.  Pt denies chest pain, increased sob or doe, wheezing, orthopnea, PND, increased LE swelling, palpitations, dizziness or syncope.   Pt denies polydipsia, polyuria, or new focal neuro s/s.   Denies urinary symptoms such as dysuria, frequency, urgency, flank pain, hematuria or n/v, fever, chills.  Denies cough.  Does have worsening recent bilateral knee pain with hx of ongoing djd, has had cortisone in past.           Wt Readings from Last 3 Encounters:  08/03/22 224 lb (101.6 kg)  06/29/22 224 lb (101.6 kg)  05/31/22 225 lb (102.1 kg)   BP Readings from Last 3 Encounters:  08/03/22 126/70  06/29/22 126/64  05/31/22 120/66         Past Medical History:  Diagnosis Date   Arthritis    fingers   CKD (chronic kidney disease) stage 3, GFR 30-59 ml/min (HCC) 11/30/2017   Depression    Dizziness    in AM, getting out of bed   Dysrhythmia    A fib   Endometrial ca ([place]) 11/30/2017   S/p surgury 1990's   GERD (gastroesophageal reflux disease) 11/30/2017   HLD (hyperlipidemia) 11/30/2017   Hypercholesteremia    Hypertension    Interstitial lung disease ([place])    Neuropathy    bilateral feet   Pneumonia 12/14/2021   Shortness of breath dyspnea    Sleep apnea    has CPAP, doesn't use   Umbilical hernia    Past Surgical History:  Procedure Laterality Date   ABDOMINAL HYSTERECTOMY     BROW LIFT Bilateral 07/15/2015   Procedure: BLEPHAROPLASTY;  Surgeon: Karle Starch, MD;   Location: [facility];  Service: Ophthalmology;  Laterality: Bilateral;   CATARACT EXTRACTION W/PHACO Right 12/31/2021   Procedure: CATARACT EXTRACTION PHACO AND INTRAOCULAR LENS PLACEMENT ([place]) RIGHT;  Surgeon: Norvel Richards, MD;  Location: [facility];  Service: Ophthalmology;  Laterality: Right;  17.11 1:46.1   CHOLECYSTECTOMY     HAMMER TOE SURGERY     HERNIA REPAIR     KNEE ARTHROSCOPY Bilateral    PTOSIS REPAIR Bilateral 07/15/2015   Procedure: PTOSIS REPAIR;  Surgeon: Karle Starch, MD;  Location: [facility];  Service: Ophthalmology;  Laterality: Bilateral;  CPAP   TONSILLECTOMY      reports that she quit smoking about 36 years ago. Her smoking use included cigarettes. She has a 10.00 pack-year smoking history. She has never used smokeless tobacco. She reports that she does not drink alcohol and does not use drugs. family history includes Congestive Heart Failure in her mother; Diabetes in her son; Parkinson's disease in her father; Stroke in her father. Allergies  Allergen Reactions   Lipitor [Atorvastatin] Other (See Comments)    Memory issues   Requip [Ropinirole Hcl] Other (See Comments)    Pt reports feeling generally unwell on this medication   Current Outpatient Medications on File Prior to  Visit  Medication Sig Dispense Refill   albuterol (PROVENTIL HFA) 108 (90 Base) MCG/ACT inhaler 1-2 puff EVERY 6 HOURS (route: inhalation)     albuterol (VENTOLIN HFA) 108 (90 Base) MCG/ACT inhaler Inhale 1-2 puffs into the lungs every 6 (six) hours as needed for wheezing or shortness of breath. 1 each 11   amitriptyline (ELAVIL) 100 MG tablet TAKE 1/2 - 1 TABLET BY MOUTH AT BEDTIME FOR SLEEP AND FEET PAIN (Patient taking differently: Take 50-100 mg by mouth at bedtime.) 90 tablet 1   apixaban (ELIQUIS) 5 MG TABS tablet Take 1 tablet (5 mg total) by mouth 2 (two) times daily. 180 tablet 3   cefdinir (OMNICEF) 300 MG capsule Take 300 mg by mouth every 12  (twelve) hours.     Cholecalciferol (VITAMIN D-3) 25 MCG (1000 UT) CAPS Take 1 capsule by mouth daily.     citalopram (CELEXA) 10 MG tablet Take 1 tablet (10 mg total) by mouth daily. 90 tablet 3   cyanocobalamin (VITAMIN B12) 1000 MCG/ML injection INJECT 1 ML INTO MUSCLE EVERY 30 DAYS 3 mL 1   diltiazem (CARDIZEM) 120 MG tablet Take 120 mg by mouth daily.     fenofibrate micronized (LOFIBRA) 134 MG capsule TAKE 1 CAPSULE BY MOUTH EVERY DAY IN THE MORNING BEFORE BREAKFAST 90 capsule 3   furosemide (LASIX) 40 MG tablet Take 1 tablet (40 mg total) by mouth every other day. 1 tab by mouth in the AM, and 1 tab by mouth in the PM as needed for persistent swelling or weight gain more than 3-5 lbs 60 tablet 11   losartan (COZAAR) 100 MG tablet TAKE 1/2 (ONE HALF) TABLET BY MOUTH ONCE DAILY 45 tablet 3   meloxicam (MOBIC) 15 MG tablet Take 1 tablet (15 mg total) by mouth daily. 30 tablet 2   metoprolol succinate (TOPROL-XL) 50 MG 24 hr tablet TAKE 1 TABLET BY MOUTH DAILY. TAKE WITH OR IMMEDIATELY FOLLOWING A MEAL. 90 tablet 3   nitrofurantoin, macrocrystal-monohydrate, (MACROBID) 100 MG capsule Take 1 capsule (100 mg total) by mouth 2 (two) times daily. 20 capsule 0   pantoprazole (PROTONIX) 40 MG tablet TAKE 1 TABLET BY MOUTH EVERY DAY 90 tablet 3   predniSONE (DELTASONE) 10 MG tablet 3 tabs by mouth per day for 3 days,2tabs per day for 3 days,1tab per day for 3 days 18 tablet 0   Respiratory Therapy Supplies (FLUTTER) DEVI Use as directed 1 each 0   STUDY - ASPIRE - apixaban 5 mg or placebo tablet (PI-Sethi) Per instructions TWICE A DAY (route: oral)     triamcinolone cream (KENALOG) 0.5 % Apply 1 Application topically 2 (two) times daily as needed. 454 g 1   No current facility-administered medications on file prior to visit.        ROS:  All others reviewed and negative.  Objective        PE:  BP 126/70 (BP Location: Left Arm, Patient Position: Sitting, Cuff Size: Large)   Pulse (!) 55   Temp  (!) 97.5 F (36.4 C) (Oral)   Ht 5' (1.524 m)   Wt 224 lb (101.6 kg)   SpO2 95%   BMI 43.75 kg/m                 Constitutional: Pt appears in NAD               HENT: Head: NCAT.  Right Ear: External ear normal.                 Left Ear: External ear normal.                Eyes: . Pupils are equal, round, and reactive to light. Conjunctivae and EOM are normal               Nose: without d/c or deformity               Neck: Neck supple. Gross normal ROM               Cardiovascular: Normal rate and regular rhythm.                 Pulmonary/Chest: Effort normal and breath sounds without rales or wheezing.                Abd:  Soft, NT, ND, + BS, no organomegaly               Bilat knees with bony degeneraitve changes, small effusions               Neurological: Pt is alert. At baseline orientation, motor grossly intact               Skin: Skin is warm. No rashes, no other new lesions, LE edema - none               Psychiatric: Pt behavior is normal without agitation but confused, possible hallucination?  Micro: none  Cardiac tracings I have personally interpreted today:  none  Pertinent Radiological findings (summarize): none   Lab Results  Component Value Date   WBC 8.4 08/03/2022   HGB 12.0 08/03/2022   HCT 36.0 08/03/2022   PLT 250.0 08/03/2022   GLUCOSE 146 (H) 08/03/2022   CHOL 149 07/05/2022   TRIG 77.0 07/05/2022   HDL 55.90 07/05/2022   LDLCALC 78 07/05/2022   ALT 13 08/03/2022   AST 24 08/03/2022   NA 142 08/03/2022   K 4.3 08/03/2022   CL 99 08/03/2022   CREATININE 1.29 (H) 08/03/2022   BUN 24 (H) 08/03/2022   CO2 33 (H) 08/03/2022   TSH 2.17 07/05/2022   HGBA1C 5.8 07/05/2022   MICROALBUR 1.7 07/05/2022   Assessment/Plan:  Kathy [name] is a 82 y.o. White or Caucasian [1] female with  has a past medical history of Arthritis, CKD (chronic kidney disease) stage 3, GFR 30-59 ml/min (HCC) (11/30/2017), Depression, Dizziness, Dysrhythmia,  Endometrial ca (HCC) (11/30/2017), GERD (gastroesophageal reflux disease) (11/30/2017), HLD (hyperlipidemia) (11/30/2017), Hypercholesteremia, Hypertension, Interstitial lung disease ([place]), Neuropathy, Pneumonia (12/14/2021), Shortness of breath dyspnea, Sleep apnea, and Umbilical hernia.  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  Cathlean Cower, MD 08/06/2022 5:02 AM [facility]

## 2022-08-05 NOTE — Progress Notes (Deleted)
I, Kathy [name], LAT, ATC acting as a scribe for Kathy Leader, MD.  Subjective:    CC: Bilat knee pain  HPI: Pt is an 82 y/o female c/o bilat knee pain ongoing for ?? Pt locates pain to   Knee swelling: Mechanical symptoms: Aggravates: Treatments tried:   Dx imaging: 06/22/16 L knee XR  Pertinent review of Systems: ***  Relevant historical information: ***   Objective:   There were no vitals filed for this visit. General: Well Developed, well nourished, and in no acute distress.   MSK: ***  Lab and Radiology Results Results for orders placed or performed in visit on 08/03/22 (from the past 72 hour(s))  Basic metabolic panel     Status: Abnormal   Collection Time: 08/03/22  3:38 PM  Result Value Ref Range   Sodium 142 135 - 145 mEq/L   Potassium 4.3 3.5 - 5.1 mEq/L   Chloride 99 96 - 112 mEq/L   CO2 33 (H) 19 - 32 mEq/L   Glucose, Bld 146 (H) 70 - 99 mg/dL   BUN 24 (H) 6 - 23 mg/dL   Creatinine, Ser 1.29 (H) 0.40 - 1.20 mg/dL   GFR 38.89 (L) >60.00 mL/min    Comment: Calculated using the CKD-EPI Creatinine Equation (2021)   Calcium 9.7 8.4 - 10.5 mg/dL  CBC with Differential/Platelet     Status: Abnormal   Collection Time: 08/03/22  3:38 PM  Result Value Ref Range   WBC 8.4 4.0 - 10.5 K/uL   RBC 3.66 (L) 3.87 - 5.11 Mil/uL   Hemoglobin 12.0 12.0 - 15.0 g/dL   HCT 36.0 36.0 - 46.0 %   MCV 98.1 78.0 - 100.0 fl   MCHC 33.3 30.0 - 36.0 g/dL   RDW 14.0 11.5 - 15.5 %   Platelets 250.0 150.0 - 400.0 K/uL   Neutrophils Relative % 76.2 43.0 - 77.0 %   Lymphocytes Relative 14.4 12.0 - 46.0 %   Monocytes Relative 6.3 3.0 - 12.0 %   Eosinophils Relative 2.7 0.0 - 5.0 %   Basophils Relative 0.4 0.0 - 3.0 %   Neutro Abs 6.4 1.4 - 7.7 K/uL   Lymphs Abs 1.2 0.7 - 4.0 K/uL   Monocytes Absolute 0.5 0.1 - 1.0 K/uL   Eosinophils Absolute 0.2 0.0 - 0.7 K/uL   Basophils Absolute 0.0 0.0 - 0.1 K/uL  Hepatic function panel     Status: None   Collection Time: 08/03/22   3:38 PM  Result Value Ref Range   Total Bilirubin 0.6 0.2 - 1.2 mg/dL   Bilirubin, Direct 0.2 0.0 - 0.3 mg/dL   Alkaline Phosphatase 46 39 - 117 U/L   AST 24 0 - 37 U/L   ALT 13 0 - 35 U/L   Total Protein 7.9 6.0 - 8.3 g/dL   Albumin 3.9 3.5 - 5.2 g/dL  Urinalysis, Routine w reflex microscopic     Status: Abnormal   Collection Time: 08/03/22  3:38 PM  Result Value Ref Range   Color, Urine YELLOW Yellow;Lt. Yellow;Straw;Dark Yellow;Amber;Green;Red;Brown   APPearance Cloudy (A) Clear;Turbid;Slightly Cloudy;Cloudy   Specific Gravity, Urine 1.025 1.000 - 1.030   pH 6.0 5.0 - 8.0   Total Protein, Urine TRACE (A) Negative   Urine Glucose NEGATIVE Negative   Ketones, ur TRACE (A) Negative   Bilirubin Urine SMALL (A) Negative   Hgb urine dipstick MODERATE (A) Negative   Urobilinogen, UA 4.0 (A) 0.0 - 1.0   Leukocytes,Ua MODERATE (A) Negative  Nitrite NEGATIVE Negative   WBC, UA TNTC(>50/hpf) (A) 0-2/hpf   RBC / HPF 0-2/hpf 0-2/hpf   Mucus, UA Presence of (A) None   Squamous Epithelial / HPF Rare(0-4/hpf) Rare(0-4/hpf)   Renal Epithel, UA Rare(0-4/hpf) (A) None   Bacteria, UA Many(>50/hpf) (A) None   No results found.    Impression and Recommendations:    Assessment and Plan: 82 y.o. female with ***.  PDMP not reviewed this encounter. No orders of the defined types were placed in this encounter.  No orders of the defined types were placed in this encounter.   Discussed warning signs or symptoms. Please see discharge instructions. Patient expresses understanding.   ***

## 2022-08-06 ENCOUNTER — Other Ambulatory Visit: Payer: Self-pay | Admitting: Internal Medicine

## 2022-08-06 ENCOUNTER — Encounter: Payer: Self-pay | Admitting: Internal Medicine

## 2022-08-06 LAB — URINE CULTURE

## 2022-08-06 MED ORDER — NITROFURANTOIN MONOHYD MACRO 100 MG PO CAPS: 100.0000 mg | ORAL_CAPSULE | Freq: Two times a day (BID) | ORAL | 0 refills | Status: DC

## 2022-08-06 NOTE — Assessment & Plan Note (Signed)
Likely DJD related - for sports medicine referral

## 2022-08-06 NOTE — Assessment & Plan Note (Signed)
Improved recenlty, but with confusion will need f/u cxr

## 2022-08-06 NOTE — Assessment & Plan Note (Signed)
Also for MRI brain

## 2022-08-06 NOTE — Assessment & Plan Note (Signed)
Stable overall, cont home o2 

## 2022-08-06 NOTE — Assessment & Plan Note (Signed)
With possible psychosis - for psychiatry referral

## 2022-08-06 NOTE — Assessment & Plan Note (Signed)
Last vitamin D Lab Results  Component Value Date   VD25OH 41.53 07/05/2022   Stable, cont oral replacement

## 2022-08-06 NOTE — Assessment & Plan Note (Signed)
Pt is asympt, but will need UA r/o infection

## 2022-08-09 ENCOUNTER — Ambulatory Visit: Payer: Medicare Other | Admitting: Family Medicine

## 2022-08-09 DIAGNOSIS — G8929 Other chronic pain: Secondary | ICD-10-CM

## 2022-08-11 NOTE — Progress Notes (Unsigned)
I, [name], LAT, ATC acting as a scribe for Lynne Leader, MD.  Subjective:    CC: Bilat knee pain  HPI: Pt is an 82 y/o female c/o bilat knee pain ongoing for as long as she can remember. Sx worsening more recently.  She has a chronic history of knee arthritis.  She mentions in the past she has had steroid injections that have worked well.  She has had hyaluronic acid injections in the past that worked pretty well as well.  Pt locates pain to entire knee joint.   Knee swelling intermittent Mechanical symptoms: will buckle and give out after 3 steps.  Aggravates: Pain with WB and ambulation.  Treatments tried: Meloxicam prn.   Dx imaging: 06/22/16 L knee XR  Pertinent review of Systems: No fevers or chills  Relevant historical information: Medical history significant for knee DJD, chronic respiratory failure using oxygen.   Objective:    Vitals:   08/12/22 1335  BP: (!) 142/92  Pulse: (!) 131  SpO2: (!) 88%   General: Elderly ill-appearing woman using a walker and oxygen no acute distress.  MSK: Right knee: Large joint effusion.  Mature scar anterior knee.  Nontender.  Decreased range of motion.  Left knee: Large joint effusion.  Nontender.  Decreased range of motion.  Patient uses a walker to ambulate.  Lab and Radiology Results   Procedure: Real-time Ultrasound Guided Injection of right knee superior lateral patellar space Device: Philips Affiniti 50G Images permanently stored and available for review in PACS Verbal informed consent obtained.  Discussed risks and benefits of procedure. Warned about infection, bleeding, hyperglycemia damage to structures among others. Patient expresses understanding and agreement Time-out conducted.   Noted no overlying erythema, induration, or other signs of local infection.   Skin prepped in a sterile fashion.   Local anesthesia: Topical Ethyl chloride.   With sterile technique and under real time ultrasound guidance:  40 mg of Kenalog and 2 mL Marcaine injected into knee joint. Fluid seen entering the joint capsule.   Completed without difficulty   Spinal needle used Advised to call if fevers/chills, erythema, induration, drainage, or persistent bleeding.   Images permanently stored and available for review in the ultrasound unit.  Impression: Technically successful ultrasound guided injection.    Procedure: Real-time Ultrasound Guided Injection of left knee superior lateral patellar space Device: Philips Affiniti 50G Images permanently stored and available for review in PACS Verbal informed consent obtained.  Discussed risks and benefits of procedure. Warned about infection, bleeding, hyperglycemia damage to structures among others. Patient expresses understanding and agreement Time-out conducted.   Noted no overlying erythema, induration, or other signs of local infection.   Skin prepped in a sterile fashion.   Local anesthesia: Topical Ethyl chloride.   With sterile technique and under real time ultrasound guidance: 40 mg of Kenalog and 2 mL Marcaine injected into knee joint. Fluid seen entering the joint capsule.   Completed without difficulty   And no needle used Advised to call if fevers/chills, erythema, induration, drainage, or persistent bleeding.   Images permanently stored and available for review in the ultrasound unit.  Impression: Technically successful ultrasound guided injection.    X-ray images bilateral knees obtained today personally and independently interpreted  Right knee: Severe lateral compartment and patellofemoral DJD.  No acute fractures.  Free-floating calcific density in the medial soft tissue visible.  Appears round.  Left knee: Severe tricompartmental DJD.  No acute fractures are visible.  Again there is a  small free-floating calcific density in the soft tissue of the lower leg.  Await formal radiology review    Impression and Recommendations:    Assessment and  Plan: 82 y.o. female with bilateral knee pain thought to be due to DJD exacerbation.  This is a chronic ongoing issue with an acute exacerbation.  Plan for steroid injections bilaterally today.  Will work on authorization of both hyaluronic acid and Zilretta injections now.  If the steroid injections today do not last 3 months we should be able to move onto next steps.  She is not a good surgical candidate for total knee replacement.  PDMP not reviewed this encounter. Orders Placed This Encounter  Procedures   [place] LIMITED JOINT SPACE STRUCTURES LOW BILAT    Order Specific Question:   Reason for Exam (SYMPTOM  OR DIAGNOSIS REQUIRED)    Answer:   Bilat knee pain    Order Specific Question:   Preferred imaging location?    Answer:   [facility]   DG Knee AP/LAT W/Sunrise Left    Standing Status:   Future    Number of Occurrences:   1    Standing Expiration Date:   09/10/2022    Order Specific Question:   Reason for Exam (SYMPTOM  OR DIAGNOSIS REQUIRED)    Answer:   bilateral knee pain    Order Specific Question:   Preferred imaging location?    Answer:   [facility]   DG Knee AP/LAT W/Sunrise Right    Standing Status:   Future    Number of Occurrences:   1    Standing Expiration Date:   09/10/2022    Order Specific Question:   Reason for Exam (SYMPTOM  OR DIAGNOSIS REQUIRED)    Answer:   bilateral knee pain    Order Specific Question:   Preferred imaging location?    Answer:   [facility]   No orders of the defined types were placed in this encounter.   Discussed warning signs or symptoms. Please see discharge instructions. Patient expresses understanding.   The above documentation has been reviewed and is accurate and complete Lynne Leader, M.D.

## 2022-08-12 ENCOUNTER — Telehealth: Payer: Self-pay

## 2022-08-12 ENCOUNTER — Ambulatory Visit (INDEPENDENT_AMBULATORY_CARE_PROVIDER_SITE_OTHER): Payer: Medicare Other

## 2022-08-12 ENCOUNTER — Ambulatory Visit (INDEPENDENT_AMBULATORY_CARE_PROVIDER_SITE_OTHER): Payer: Medicare Other | Admitting: Family Medicine

## 2022-08-12 ENCOUNTER — Ambulatory Visit: Payer: Self-pay

## 2022-08-12 VITALS — BP 142/92 | HR 131 | Ht 60.0 in | Wt 225.2 lb

## 2022-08-12 DIAGNOSIS — G8929 Other chronic pain: Secondary | ICD-10-CM | POA: Diagnosis not present

## 2022-08-12 DIAGNOSIS — M25561 Pain in right knee: Secondary | ICD-10-CM

## 2022-08-12 DIAGNOSIS — M25562 Pain in left knee: Secondary | ICD-10-CM

## 2022-08-12 DIAGNOSIS — M170 Bilateral primary osteoarthritis of knee: Secondary | ICD-10-CM | POA: Diagnosis not present

## 2022-08-12 DIAGNOSIS — M1711 Unilateral primary osteoarthritis, right knee: Secondary | ICD-10-CM | POA: Diagnosis not present

## 2022-08-12 DIAGNOSIS — M1712 Unilateral primary osteoarthritis, left knee: Secondary | ICD-10-CM | POA: Diagnosis not present

## 2022-08-12 NOTE — Telephone Encounter (Signed)
Collins, Cheryll Dessert, Gaylord Shih, CMA Please authorize visco and Zilretta bilat

## 2022-08-12 NOTE — Patient Instructions (Signed)
Thank you for coming in today.   Please get an Xray today before you leave   You received an injection today. Seek immediate medical attention if the joint becomes red, extremely painful, or is oozing fluid.   Please use Voltaren gel (Generic Diclofenac Gel) up to 4x daily for pain as needed.  This is available over-the-counter as both the name brand Voltaren gel and the generic diclofenac gel.   We will work to authorize gel shots and Orvan Seen  Let me know how this goes.

## 2022-08-13 ENCOUNTER — Emergency Department (HOSPITAL_COMMUNITY): Payer: Medicare Other

## 2022-08-13 ENCOUNTER — Inpatient Hospital Stay (HOSPITAL_COMMUNITY)
Admission: EM | Admit: 2022-08-13 | Discharge: 2022-08-21 | DRG: 871 | Disposition: A | Discharge status: Skilled Nursing Facility | Payer: Medicare Other | Attending: Family Medicine | Admitting: Family Medicine

## 2022-08-13 ENCOUNTER — Encounter (HOSPITAL_COMMUNITY): Payer: Self-pay

## 2022-08-13 ENCOUNTER — Other Ambulatory Visit: Payer: Self-pay

## 2022-08-13 DIAGNOSIS — E875 Hyperkalemia: Secondary | ICD-10-CM | POA: Diagnosis not present

## 2022-08-13 DIAGNOSIS — N390 Urinary tract infection, site not specified: Secondary | ICD-10-CM | POA: Diagnosis present

## 2022-08-13 DIAGNOSIS — I10 Essential (primary) hypertension: Secondary | ICD-10-CM | POA: Diagnosis not present

## 2022-08-13 DIAGNOSIS — K219 Gastro-esophageal reflux disease without esophagitis: Secondary | ICD-10-CM | POA: Diagnosis present

## 2022-08-13 DIAGNOSIS — N183 Chronic kidney disease, stage 3 unspecified: Secondary | ICD-10-CM | POA: Diagnosis present

## 2022-08-13 DIAGNOSIS — M6281 Muscle weakness (generalized): Secondary | ICD-10-CM | POA: Diagnosis not present

## 2022-08-13 DIAGNOSIS — I480 Paroxysmal atrial fibrillation: Secondary | ICD-10-CM | POA: Diagnosis not present

## 2022-08-13 DIAGNOSIS — R488 Other symbolic dysfunctions: Secondary | ICD-10-CM | POA: Diagnosis not present

## 2022-08-13 DIAGNOSIS — I130 Hypertensive heart and chronic kidney disease with heart failure and stage 1 through stage 4 chronic kidney disease, or unspecified chronic kidney disease: Secondary | ICD-10-CM | POA: Diagnosis present

## 2022-08-13 DIAGNOSIS — I509 Heart failure, unspecified: Secondary | ICD-10-CM | POA: Diagnosis not present

## 2022-08-13 DIAGNOSIS — I2510 Atherosclerotic heart disease of native coronary artery without angina pectoris: Secondary | ICD-10-CM | POA: Diagnosis not present

## 2022-08-13 DIAGNOSIS — Z79899 Other long term (current) drug therapy: Secondary | ICD-10-CM | POA: Diagnosis not present

## 2022-08-13 DIAGNOSIS — Z6841 Body Mass Index (BMI) 40.0 and over, adult: Secondary | ICD-10-CM | POA: Diagnosis not present

## 2022-08-13 DIAGNOSIS — I499 Cardiac arrhythmia, unspecified: Secondary | ICD-10-CM | POA: Diagnosis not present

## 2022-08-13 DIAGNOSIS — R4189 Other symptoms and signs involving cognitive functions and awareness: Secondary | ICD-10-CM | POA: Diagnosis present

## 2022-08-13 DIAGNOSIS — G9341 Metabolic encephalopathy: Secondary | ICD-10-CM | POA: Diagnosis present

## 2022-08-13 DIAGNOSIS — R739 Hyperglycemia, unspecified: Secondary | ICD-10-CM | POA: Diagnosis not present

## 2022-08-13 DIAGNOSIS — I081 Rheumatic disorders of both mitral and tricuspid valves: Secondary | ICD-10-CM | POA: Diagnosis not present

## 2022-08-13 DIAGNOSIS — A419 Sepsis, unspecified organism: Principal | ICD-10-CM | POA: Diagnosis present

## 2022-08-13 DIAGNOSIS — N189 Chronic kidney disease, unspecified: Secondary | ICD-10-CM | POA: Diagnosis not present

## 2022-08-13 DIAGNOSIS — Z1152 Encounter for screening for COVID-19: Secondary | ICD-10-CM | POA: Diagnosis not present

## 2022-08-13 DIAGNOSIS — I700 Atherosclerosis of aorta: Secondary | ICD-10-CM | POA: Diagnosis not present

## 2022-08-13 DIAGNOSIS — I493 Ventricular premature depolarization: Secondary | ICD-10-CM | POA: Diagnosis present

## 2022-08-13 DIAGNOSIS — Z823 Family history of stroke: Secondary | ICD-10-CM

## 2022-08-13 DIAGNOSIS — J9611 Chronic respiratory failure with hypoxia: Secondary | ICD-10-CM | POA: Diagnosis present

## 2022-08-13 DIAGNOSIS — J849 Interstitial pulmonary disease, unspecified: Secondary | ICD-10-CM | POA: Diagnosis present

## 2022-08-13 DIAGNOSIS — G629 Polyneuropathy, unspecified: Secondary | ICD-10-CM | POA: Diagnosis present

## 2022-08-13 DIAGNOSIS — J189 Pneumonia, unspecified organism: Secondary | ICD-10-CM | POA: Diagnosis not present

## 2022-08-13 DIAGNOSIS — Z87440 Personal history of urinary (tract) infections: Secondary | ICD-10-CM

## 2022-08-13 DIAGNOSIS — B9629 Other Escherichia coli [E. coli] as the cause of diseases classified elsewhere: Secondary | ICD-10-CM | POA: Diagnosis not present

## 2022-08-13 DIAGNOSIS — Z8249 Family history of ischemic heart disease and other diseases of the circulatory system: Secondary | ICD-10-CM

## 2022-08-13 DIAGNOSIS — R6520 Severe sepsis without septic shock: Secondary | ICD-10-CM | POA: Diagnosis not present

## 2022-08-13 DIAGNOSIS — I272 Pulmonary hypertension, unspecified: Secondary | ICD-10-CM | POA: Diagnosis present

## 2022-08-13 DIAGNOSIS — Z87891 Personal history of nicotine dependence: Secondary | ICD-10-CM

## 2022-08-13 DIAGNOSIS — G9340 Encephalopathy, unspecified: Secondary | ICD-10-CM | POA: Diagnosis not present

## 2022-08-13 DIAGNOSIS — F29 Unspecified psychosis not due to a substance or known physiological condition: Secondary | ICD-10-CM | POA: Diagnosis not present

## 2022-08-13 DIAGNOSIS — Z7901 Long term (current) use of anticoagulants: Secondary | ICD-10-CM

## 2022-08-13 DIAGNOSIS — Z7401 Bed confinement status: Secondary | ICD-10-CM | POA: Diagnosis not present

## 2022-08-13 DIAGNOSIS — R059 Cough, unspecified: Secondary | ICD-10-CM | POA: Diagnosis not present

## 2022-08-13 DIAGNOSIS — Z820 Family history of epilepsy and other diseases of the nervous system: Secondary | ICD-10-CM

## 2022-08-13 DIAGNOSIS — J679 Hypersensitivity pneumonitis due to unspecified organic dust: Secondary | ICD-10-CM | POA: Diagnosis present

## 2022-08-13 DIAGNOSIS — K5730 Diverticulosis of large intestine without perforation or abscess without bleeding: Secondary | ICD-10-CM | POA: Diagnosis not present

## 2022-08-13 DIAGNOSIS — E7800 Pure hypercholesterolemia, unspecified: Secondary | ICD-10-CM | POA: Diagnosis present

## 2022-08-13 DIAGNOSIS — F0390 Unspecified dementia without behavioral disturbance: Secondary | ICD-10-CM | POA: Diagnosis present

## 2022-08-13 DIAGNOSIS — I4819 Other persistent atrial fibrillation: Secondary | ICD-10-CM | POA: Diagnosis not present

## 2022-08-13 DIAGNOSIS — Z888 Allergy status to other drugs, medicaments and biological substances status: Secondary | ICD-10-CM

## 2022-08-13 DIAGNOSIS — N1831 Chronic kidney disease, stage 3a: Secondary | ICD-10-CM | POA: Diagnosis present

## 2022-08-13 DIAGNOSIS — Z90710 Acquired absence of both cervix and uterus: Secondary | ICD-10-CM

## 2022-08-13 DIAGNOSIS — Z9049 Acquired absence of other specified parts of digestive tract: Secondary | ICD-10-CM

## 2022-08-13 DIAGNOSIS — Z833 Family history of diabetes mellitus: Secondary | ICD-10-CM

## 2022-08-13 DIAGNOSIS — Z66 Do not resuscitate: Secondary | ICD-10-CM | POA: Diagnosis not present

## 2022-08-13 DIAGNOSIS — Z791 Long term (current) use of non-steroidal anti-inflammatories (NSAID): Secondary | ICD-10-CM

## 2022-08-13 DIAGNOSIS — I4891 Unspecified atrial fibrillation: Secondary | ICD-10-CM | POA: Diagnosis present

## 2022-08-13 DIAGNOSIS — I25119 Atherosclerotic heart disease of native coronary artery with unspecified angina pectoris: Secondary | ICD-10-CM | POA: Diagnosis present

## 2022-08-13 DIAGNOSIS — R0902 Hypoxemia: Secondary | ICD-10-CM | POA: Diagnosis not present

## 2022-08-13 DIAGNOSIS — R410 Disorientation, unspecified: Secondary | ICD-10-CM | POA: Diagnosis not present

## 2022-08-13 DIAGNOSIS — Z1612 Extended spectrum beta lactamase (ESBL) resistance: Secondary | ICD-10-CM | POA: Diagnosis not present

## 2022-08-13 DIAGNOSIS — J8410 Pulmonary fibrosis, unspecified: Secondary | ICD-10-CM | POA: Diagnosis not present

## 2022-08-13 DIAGNOSIS — Z8542 Personal history of malignant neoplasm of other parts of uterus: Secondary | ICD-10-CM

## 2022-08-13 DIAGNOSIS — I3139 Other pericardial effusion (noninflammatory): Secondary | ICD-10-CM | POA: Diagnosis not present

## 2022-08-13 DIAGNOSIS — B9620 Unspecified Escherichia coli [E. coli] as the cause of diseases classified elsewhere: Secondary | ICD-10-CM | POA: Diagnosis present

## 2022-08-13 DIAGNOSIS — Q2112 Patent foramen ovale: Secondary | ICD-10-CM | POA: Diagnosis not present

## 2022-08-13 DIAGNOSIS — R2681 Unsteadiness on feet: Secondary | ICD-10-CM | POA: Diagnosis not present

## 2022-08-13 DIAGNOSIS — I089 Rheumatic multiple valve disease, unspecified: Secondary | ICD-10-CM | POA: Diagnosis not present

## 2022-08-13 DIAGNOSIS — R0689 Other abnormalities of breathing: Secondary | ICD-10-CM | POA: Diagnosis not present

## 2022-08-13 DIAGNOSIS — R079 Chest pain, unspecified: Secondary | ICD-10-CM | POA: Diagnosis not present

## 2022-08-13 DIAGNOSIS — R4182 Altered mental status, unspecified: Secondary | ICD-10-CM | POA: Diagnosis not present

## 2022-08-13 DIAGNOSIS — Z8701 Personal history of pneumonia (recurrent): Secondary | ICD-10-CM

## 2022-08-13 LAB — URINALYSIS, ROUTINE W REFLEX MICROSCOPIC
Bilirubin Urine: NEGATIVE
Glucose, UA: NEGATIVE mg/dL
Hgb urine dipstick: NEGATIVE
Ketones, ur: NEGATIVE mg/dL
Leukocytes,Ua: NEGATIVE
Nitrite: NEGATIVE
Protein, ur: NEGATIVE mg/dL
Specific Gravity, Urine: 1.019 (ref 1.005–1.030)
pH: 5 (ref 5.0–8.0)

## 2022-08-13 LAB — CBC
HCT: 34.3 % — ABNORMAL LOW (ref 36.0–46.0)
Hemoglobin: 11.3 g/dL — ABNORMAL LOW (ref 12.0–15.0)
MCH: 33.1 pg (ref 26.0–34.0)
MCHC: 32.9 g/dL (ref 30.0–36.0)
MCV: 100.6 fL — ABNORMAL HIGH (ref 80.0–100.0)
Platelets: 191 10*3/uL (ref 150–400)
RBC: 3.41 MIL/uL — ABNORMAL LOW (ref 3.87–5.11)
RDW: 13.3 % (ref 11.5–15.5)
WBC: 13.1 10*3/uL — ABNORMAL HIGH (ref 4.0–10.5)
nRBC: 0 % (ref 0.0–0.2)

## 2022-08-13 LAB — TROPONIN I (HIGH SENSITIVITY)
Troponin I (High Sensitivity): 13 ng/L (ref <18)
Troponin I (High Sensitivity): 14 ng/L (ref <18)

## 2022-08-13 LAB — COMPREHENSIVE METABOLIC PANEL
ALT: 19 U/L (ref 0–44)
AST: 36 U/L (ref 15–41)
Albumin: 3 g/dL — ABNORMAL LOW (ref 3.5–5.0)
Alkaline Phosphatase: 48 U/L (ref 38–126)
Anion gap: 11 (ref 5–15)
BUN: 32 mg/dL — ABNORMAL HIGH (ref 8–23)
CO2: 28 mmol/L (ref 22–32)
Calcium: 9.5 mg/dL (ref 8.9–10.3)
Chloride: 98 mmol/L (ref 98–111)
Creatinine, Ser: 1.36 mg/dL — ABNORMAL HIGH (ref 0.44–1.00)
GFR, Estimated: 39 mL/min — ABNORMAL LOW (ref >60)
Glucose, Bld: 253 mg/dL — ABNORMAL HIGH (ref 70–99)
Potassium: 4.6 mmol/L (ref 3.5–5.1)
Sodium: 137 mmol/L (ref 135–145)
Total Bilirubin: 0.4 mg/dL (ref 0.3–1.2)
Total Protein: 7.1 g/dL (ref 6.5–8.1)

## 2022-08-13 LAB — RESP PANEL BY RT-PCR (RSV, FLU A&B, COVID)  RVPGX2
Influenza A by PCR: NEGATIVE
Influenza B by PCR: NEGATIVE
Resp Syncytial Virus by PCR: NEGATIVE
SARS Coronavirus 2 by RT PCR: NEGATIVE

## 2022-08-13 LAB — LACTIC ACID, PLASMA
Lactic Acid, Venous: 3 mmol/L (ref 0.5–1.9)
Lactic Acid, Venous: 1.2 mmol/L (ref 0.5–1.9)

## 2022-08-13 LAB — LIPASE, BLOOD: Lipase: 28 U/L (ref 11–51)

## 2022-08-13 LAB — CBG MONITORING, ED: Glucose-Capillary: 242 mg/dL — ABNORMAL HIGH (ref 70–99)

## 2022-08-13 LAB — BRAIN NATRIURETIC PEPTIDE: B Natriuretic Peptide: 164.2 pg/mL — ABNORMAL HIGH (ref 0.0–100.0)

## 2022-08-13 MED ORDER — LACTATED RINGERS IV BOLUS
500.0000 mL | Freq: Once | INTRAVENOUS | Status: AC
  Administered 2022-08-13: 500 mL via INTRAVENOUS

## 2022-08-13 MED ORDER — SODIUM CHLORIDE 0.9 % IV SOLN
1.0000 g | Freq: Two times a day (BID) | INTRAVENOUS | Status: DC
  Administered 2022-08-13 – 2022-08-17 (×7): 1 g via INTRAVENOUS
  Filled 2022-08-13 (×9): qty 20

## 2022-08-13 MED ORDER — DILTIAZEM HCL-DEXTROSE 125-5 MG/125ML-% IV SOLN (PREMIX)
5.0000 mg/h | INTRAVENOUS | Status: DC
  Administered 2022-08-13: 5 mg/h via INTRAVENOUS
  Administered 2022-08-14: 15 mg/h via INTRAVENOUS
  Administered 2022-08-14: 5 mg/h via INTRAVENOUS
  Filled 2022-08-13 (×3): qty 125

## 2022-08-13 MED ORDER — DILTIAZEM LOAD VIA INFUSION
10.0000 mg | Freq: Once | INTRAVENOUS | Status: AC
  Administered 2022-08-13: 10 mg via INTRAVENOUS
  Filled 2022-08-13: qty 10

## 2022-08-13 NOTE — ED Provider Notes (Signed)
  [facility] Provider Note   CSN: [id number] Arrival date & time: 08/13/22  1531     History No chief complaint on file.   HPI Kathy [name] is a 82 y.o. female presenting for chief complaint of altered mental status.  She is an 82 year old female with extensive medical history.  Per family she has been having shortness of breath, altered mental status over the last few days with acute worsening of dyspnea today.  History of CKD, ILD, chronic oxygen requirement of 2 to 3 L, CKD, A-fib, heart failure, pulmonary hypertension.  She is currently being treated with Macrobid for UTI.   Patient's recorded medical, surgical, social, medication list and allergies were reviewed in the Snapshot window as part of the initial history.   Review of Systems   Review of Systems  Physical Exam Updated Vital Signs There were no vitals taken for this visit. Physical Exam   ED Course/ Medical Decision Making/ A&P    Procedures Procedures   Medications Ordered in ED Medications - No data to display  Medical Decision Making:    Kathy [name] is a 82 y.o. female who presented to the ED today with *** detailed above.     {crccomplexity:27900}  Complete initial physical exam performed, notably the patient  was ***.      Reviewed and confirmed nursing documentation for past medical history, family history, social history.    Initial Assessment:   With the patient's presentation of ***, most likely diagnosis is ***. Other diagnoses were considered including (but not limited to) ***. These are considered less likely due to history of present illness and physical exam findings.   {crccopa:27899}  Initial Plan:  ***  ***Screening labs including CBC and Metabolic panel to evaluate for infectious or metabolic etiology of disease.  ***Urinalysis with reflex culture ordered to evaluate for UTI or relevant urologic/nephrologic pathology.  ***CXR to evaluate for  structural/infectious intrathoracic pathology.  ***EKG to evaluate for cardiac pathology. Objective evaluation as below reviewed with plan for close reassessment  Initial Study Results:   Laboratory  All laboratory results reviewed without evidence of clinically relevant pathology.   ***Exceptions include: ***   ***EKG EKG was reviewed independently. Rate, rhythm, axis, intervals all examined and without medically relevant abnormality. ST segments without concerns for elevations.    Radiology  All images reviewed independently. ***Agree with radiology report at this time.   No results found.   Consults:  Case discussed with ***.   Final Assessment and Plan:   ***   ***  Clinical Impression: No diagnosis found.   Data Unavailable   Final Clinical Impression(s) / ED Diagnoses Final diagnoses:  None    Rx / DC Orders ED Discharge Orders     None

## 2022-08-13 NOTE — Progress Notes (Signed)
Pharmacy Antibiotic Note  Kathy [name] is a 82 y.o. female admitted on 08/13/2022 presenting with confusion, recent UTI, Cx with resistant E. Coli sens to carbapenems.  Pharmacy has been consulted for Merrem dosing.  Plan: Merrem 1g IV every 12 hours Monitor renal function, Cx to narrow F/u ID recs    Height: 5' (152.4 cm) Weight: 102.1 kg (225 lb) IBW/kg (Calculated) : 45.5  Temp (24hrs), Avg:97.6 F (36.4 C), Min:97.6 F (36.4 C), Max:97.6 F (36.4 C)  Recent Labs  Lab 08/13/22 1541 08/13/22 1752  WBC 13.1*  --   CREATININE 1.36*  --   LATICACIDVEN  --  3.0*    Estimated Creatinine Clearance: 34.9 mL/min (A) (by C-G formula based on SCr of 1.36 mg/dL (H)).    Allergies  Allergen Reactions   Lipitor [Atorvastatin] Other (See Comments)    Memory issues   Requip [Ropinirole Hcl] Other (See Comments)    Pt reports feeling generally unwell on this medication    Bertis Ruddy, PharmD, [place] Pharmacist ED Pharmacist Phone # [phone] 08/13/2022 9:07 PM

## 2022-08-13 NOTE — Telephone Encounter (Signed)
VOB initiated for BILAT Zilretta

## 2022-08-13 NOTE — ED Triage Notes (Signed)
PER EMS: pt is from home with c/o confusion that started today "this afternoon" per family report to EMS. Baseline mentation is A&Ox4. Initial complaint was trouble breathing, but ems arrived and pts RR was 24. 2L [place] at baseline, hx of recurrent UTIs, is taking Macobid. Pt is A&Ox3, answered year incorrectly. 136/86, HR-136 afib, CBG-336

## 2022-08-13 NOTE — Telephone Encounter (Signed)
VOB initiated for Kathy [name]

## 2022-08-14 ENCOUNTER — Emergency Department (HOSPITAL_COMMUNITY): Payer: Medicare Other

## 2022-08-14 ENCOUNTER — Inpatient Hospital Stay (HOSPITAL_COMMUNITY): Payer: Medicare Other

## 2022-08-14 ENCOUNTER — Observation Stay (HOSPITAL_COMMUNITY): Payer: Medicare Other

## 2022-08-14 DIAGNOSIS — E875 Hyperkalemia: Secondary | ICD-10-CM | POA: Diagnosis not present

## 2022-08-14 DIAGNOSIS — I4819 Other persistent atrial fibrillation: Secondary | ICD-10-CM | POA: Diagnosis present

## 2022-08-14 DIAGNOSIS — B9629 Other Escherichia coli [E. coli] as the cause of diseases classified elsewhere: Secondary | ICD-10-CM

## 2022-08-14 DIAGNOSIS — J849 Interstitial pulmonary disease, unspecified: Secondary | ICD-10-CM | POA: Diagnosis present

## 2022-08-14 DIAGNOSIS — Z6841 Body Mass Index (BMI) 40.0 and over, adult: Secondary | ICD-10-CM | POA: Diagnosis not present

## 2022-08-14 DIAGNOSIS — J8410 Pulmonary fibrosis, unspecified: Secondary | ICD-10-CM | POA: Diagnosis not present

## 2022-08-14 DIAGNOSIS — Z1152 Encounter for screening for COVID-19: Secondary | ICD-10-CM | POA: Diagnosis not present

## 2022-08-14 DIAGNOSIS — N1831 Chronic kidney disease, stage 3a: Secondary | ICD-10-CM

## 2022-08-14 DIAGNOSIS — Z87891 Personal history of nicotine dependence: Secondary | ICD-10-CM | POA: Diagnosis not present

## 2022-08-14 DIAGNOSIS — I2510 Atherosclerotic heart disease of native coronary artery without angina pectoris: Secondary | ICD-10-CM | POA: Diagnosis not present

## 2022-08-14 DIAGNOSIS — N390 Urinary tract infection, site not specified: Secondary | ICD-10-CM

## 2022-08-14 DIAGNOSIS — I3139 Other pericardial effusion (noninflammatory): Secondary | ICD-10-CM | POA: Diagnosis not present

## 2022-08-14 DIAGNOSIS — F0390 Unspecified dementia without behavioral disturbance: Secondary | ICD-10-CM | POA: Diagnosis present

## 2022-08-14 DIAGNOSIS — I499 Cardiac arrhythmia, unspecified: Secondary | ICD-10-CM | POA: Diagnosis not present

## 2022-08-14 DIAGNOSIS — G9341 Metabolic encephalopathy: Secondary | ICD-10-CM | POA: Diagnosis present

## 2022-08-14 DIAGNOSIS — I272 Pulmonary hypertension, unspecified: Secondary | ICD-10-CM | POA: Diagnosis present

## 2022-08-14 DIAGNOSIS — Z79899 Other long term (current) drug therapy: Secondary | ICD-10-CM | POA: Diagnosis not present

## 2022-08-14 DIAGNOSIS — R4182 Altered mental status, unspecified: Secondary | ICD-10-CM | POA: Diagnosis not present

## 2022-08-14 DIAGNOSIS — N189 Chronic kidney disease, unspecified: Secondary | ICD-10-CM | POA: Diagnosis not present

## 2022-08-14 DIAGNOSIS — I4891 Unspecified atrial fibrillation: Secondary | ICD-10-CM | POA: Diagnosis not present

## 2022-08-14 DIAGNOSIS — I480 Paroxysmal atrial fibrillation: Secondary | ICD-10-CM | POA: Diagnosis not present

## 2022-08-14 DIAGNOSIS — I130 Hypertensive heart and chronic kidney disease with heart failure and stage 1 through stage 4 chronic kidney disease, or unspecified chronic kidney disease: Secondary | ICD-10-CM | POA: Diagnosis present

## 2022-08-14 DIAGNOSIS — J9611 Chronic respiratory failure with hypoxia: Secondary | ICD-10-CM | POA: Diagnosis present

## 2022-08-14 DIAGNOSIS — J189 Pneumonia, unspecified organism: Secondary | ICD-10-CM | POA: Diagnosis present

## 2022-08-14 DIAGNOSIS — Z7401 Bed confinement status: Secondary | ICD-10-CM | POA: Diagnosis not present

## 2022-08-14 DIAGNOSIS — I700 Atherosclerosis of aorta: Secondary | ICD-10-CM | POA: Diagnosis not present

## 2022-08-14 DIAGNOSIS — M6281 Muscle weakness (generalized): Secondary | ICD-10-CM | POA: Diagnosis not present

## 2022-08-14 DIAGNOSIS — R2681 Unsteadiness on feet: Secondary | ICD-10-CM | POA: Diagnosis not present

## 2022-08-14 DIAGNOSIS — I089 Rheumatic multiple valve disease, unspecified: Secondary | ICD-10-CM | POA: Diagnosis not present

## 2022-08-14 DIAGNOSIS — R059 Cough, unspecified: Secondary | ICD-10-CM | POA: Diagnosis not present

## 2022-08-14 DIAGNOSIS — R488 Other symbolic dysfunctions: Secondary | ICD-10-CM | POA: Diagnosis not present

## 2022-08-14 DIAGNOSIS — G629 Polyneuropathy, unspecified: Secondary | ICD-10-CM | POA: Diagnosis present

## 2022-08-14 DIAGNOSIS — R4189 Other symptoms and signs involving cognitive functions and awareness: Secondary | ICD-10-CM | POA: Diagnosis present

## 2022-08-14 DIAGNOSIS — K5730 Diverticulosis of large intestine without perforation or abscess without bleeding: Secondary | ICD-10-CM | POA: Diagnosis not present

## 2022-08-14 DIAGNOSIS — B9620 Unspecified Escherichia coli [E. coli] as the cause of diseases classified elsewhere: Secondary | ICD-10-CM | POA: Diagnosis present

## 2022-08-14 DIAGNOSIS — Z1612 Extended spectrum beta lactamase (ESBL) resistance: Secondary | ICD-10-CM

## 2022-08-14 DIAGNOSIS — A419 Sepsis, unspecified organism: Secondary | ICD-10-CM | POA: Diagnosis present

## 2022-08-14 DIAGNOSIS — Z66 Do not resuscitate: Secondary | ICD-10-CM | POA: Diagnosis not present

## 2022-08-14 DIAGNOSIS — E7800 Pure hypercholesterolemia, unspecified: Secondary | ICD-10-CM | POA: Diagnosis present

## 2022-08-14 DIAGNOSIS — F29 Unspecified psychosis not due to a substance or known physiological condition: Secondary | ICD-10-CM | POA: Diagnosis not present

## 2022-08-14 DIAGNOSIS — R079 Chest pain, unspecified: Secondary | ICD-10-CM | POA: Diagnosis not present

## 2022-08-14 DIAGNOSIS — R0902 Hypoxemia: Secondary | ICD-10-CM | POA: Diagnosis not present

## 2022-08-14 DIAGNOSIS — I493 Ventricular premature depolarization: Secondary | ICD-10-CM | POA: Diagnosis present

## 2022-08-14 DIAGNOSIS — G9340 Encephalopathy, unspecified: Secondary | ICD-10-CM | POA: Diagnosis not present

## 2022-08-14 DIAGNOSIS — I081 Rheumatic disorders of both mitral and tricuspid valves: Secondary | ICD-10-CM | POA: Diagnosis not present

## 2022-08-14 DIAGNOSIS — J679 Hypersensitivity pneumonitis due to unspecified organic dust: Secondary | ICD-10-CM | POA: Diagnosis present

## 2022-08-14 DIAGNOSIS — K219 Gastro-esophageal reflux disease without esophagitis: Secondary | ICD-10-CM | POA: Diagnosis present

## 2022-08-14 DIAGNOSIS — I509 Heart failure, unspecified: Secondary | ICD-10-CM | POA: Diagnosis not present

## 2022-08-14 DIAGNOSIS — I10 Essential (primary) hypertension: Secondary | ICD-10-CM

## 2022-08-14 DIAGNOSIS — Q2112 Patent foramen ovale: Secondary | ICD-10-CM | POA: Diagnosis not present

## 2022-08-14 LAB — CBC
HCT: 31.2 % — ABNORMAL LOW (ref 36.0–46.0)
Hemoglobin: 10 g/dL — ABNORMAL LOW (ref 12.0–15.0)
MCH: 32.7 pg (ref 26.0–34.0)
MCHC: 32.1 g/dL (ref 30.0–36.0)
MCV: 102 fL — ABNORMAL HIGH (ref 80.0–100.0)
Platelets: 222 10*3/uL (ref 150–400)
RBC: 3.06 MIL/uL — ABNORMAL LOW (ref 3.87–5.11)
RDW: 13.5 % (ref 11.5–15.5)
WBC: 19.1 10*3/uL — ABNORMAL HIGH (ref 4.0–10.5)
nRBC: 0 % (ref 0.0–0.2)

## 2022-08-14 LAB — BASIC METABOLIC PANEL
Anion gap: 10 (ref 5–15)
BUN: 31 mg/dL — ABNORMAL HIGH (ref 8–23)
CO2: 27 mmol/L (ref 22–32)
Calcium: 8.9 mg/dL (ref 8.9–10.3)
Chloride: 98 mmol/L (ref 98–111)
Creatinine, Ser: 1.15 mg/dL — ABNORMAL HIGH (ref 0.44–1.00)
GFR, Estimated: 48 mL/min — ABNORMAL LOW (ref >60)
Glucose, Bld: 141 mg/dL — ABNORMAL HIGH (ref 70–99)
Potassium: 5.4 mmol/L — ABNORMAL HIGH (ref 3.5–5.1)
Sodium: 135 mmol/L (ref 135–145)

## 2022-08-14 LAB — URINALYSIS, W/ REFLEX TO CULTURE (INFECTION SUSPECTED)
Bacteria, UA: NONE SEEN
Bilirubin Urine: NEGATIVE
Glucose, UA: 50 mg/dL — AB
Ketones, ur: NEGATIVE mg/dL
Leukocytes,Ua: NEGATIVE
Nitrite: NEGATIVE
Protein, ur: NEGATIVE mg/dL
Specific Gravity, Urine: 1.021 (ref 1.005–1.030)
pH: 6 (ref 5.0–8.0)

## 2022-08-14 MED ORDER — ALBUTEROL SULFATE (2.5 MG/3ML) 0.083% IN NEBU: 3.0000 mL | INHALATION_SOLUTION | Freq: Four times a day (QID) | RESPIRATORY_TRACT | Status: DC | PRN

## 2022-08-14 MED ORDER — METOPROLOL SUCCINATE ER 50 MG PO TB24
50.0000 mg | ORAL_TABLET | Freq: Every day | ORAL | Status: DC
  Administered 2022-08-14 – 2022-08-15 (×2): 50 mg via ORAL
  Filled 2022-08-14: qty 2
  Filled 2022-08-14: qty 1

## 2022-08-14 MED ORDER — CITALOPRAM HYDROBROMIDE 10 MG PO TABS
10.0000 mg | ORAL_TABLET | Freq: Every day | ORAL | Status: DC
  Administered 2022-08-14 – 2022-08-20 (×7): 10 mg via ORAL
  Filled 2022-08-14 (×7): qty 1

## 2022-08-14 MED ORDER — APIXABAN 5 MG PO TABS
5.0000 mg | ORAL_TABLET | Freq: Two times a day (BID) | ORAL | Status: DC
  Administered 2022-08-14 – 2022-08-21 (×15): 5 mg via ORAL
  Filled 2022-08-14 (×15): qty 1

## 2022-08-14 MED ORDER — ONDANSETRON HCL 4 MG/2ML IJ SOLN: 4.0000 mg | Freq: Four times a day (QID) | INTRAMUSCULAR | Status: DC | PRN

## 2022-08-14 MED ORDER — PANTOPRAZOLE SODIUM 40 MG PO TBEC
40.0000 mg | DELAYED_RELEASE_TABLET | Freq: Every day | ORAL | Status: DC
  Administered 2022-08-14 – 2022-08-21 (×8): 40 mg via ORAL
  Filled 2022-08-14 (×8): qty 1

## 2022-08-14 MED ORDER — SODIUM CHLORIDE 0.9 % IV SOLN
500.0000 mg | INTRAVENOUS | Status: DC
  Administered 2022-08-14 – 2022-08-17 (×4): 500 mg via INTRAVENOUS
  Filled 2022-08-14 (×4): qty 5

## 2022-08-14 MED ORDER — ACETAMINOPHEN 325 MG PO TABS
650.0000 mg | ORAL_TABLET | ORAL | Status: DC | PRN
  Administered 2022-08-14 – 2022-08-20 (×4): 650 mg via ORAL
  Filled 2022-08-14 (×4): qty 2

## 2022-08-14 MED ORDER — AMITRIPTYLINE HCL 25 MG PO TABS
50.0000 mg | ORAL_TABLET | Freq: Every day | ORAL | Status: DC
  Administered 2022-08-14 – 2022-08-20 (×7): 50 mg via ORAL
  Filled 2022-08-14 (×8): qty 2

## 2022-08-14 MED ORDER — IOHEXOL 350 MG/ML SOLN
75.0000 mL | Freq: Once | INTRAVENOUS | Status: AC | PRN
  Administered 2022-08-14: 75 mL via INTRAVENOUS

## 2022-08-14 MED ORDER — LACTATED RINGERS IV BOLUS
1000.0000 mL | Freq: Once | INTRAVENOUS | Status: AC
  Administered 2022-08-14: 1000 mL via INTRAVENOUS

## 2022-08-14 NOTE — Assessment & Plan Note (Signed)
Cardizem gtt Cont home eliquis Remainder of home med-rec is pending at this time

## 2022-08-14 NOTE — Assessment & Plan Note (Addendum)
Suspect RUL PNA given reported symptoms and CT findings. Will continue merrem for the moment given recent ESBL in urine just this past week + ongoing confusion + SIRS Though UA today is negative. Add azithromycin for atypical coverage of PNA.

## 2022-08-14 NOTE — Assessment & Plan Note (Signed)
Med rec pending at this time. 

## 2022-08-14 NOTE — ED Notes (Signed)
Pt gone to CT 

## 2022-08-14 NOTE — Assessment & Plan Note (Signed)
Creat today looks to be about baseline.

## 2022-08-14 NOTE — H&P (Signed)
History and Physical    Patient: Kathy [name] [id number] DOB: [birth year] DOA: 08/13/2022 DOS: the patient was seen and examined on 08/14/2022 PCP: Biagio Borg, MD  Patient coming from: Home  Chief Complaint:  Chief Complaint  Patient presents with   Altered Mental Status   HPI: Kathy [name] is a 82 y.o. female with medical history significant of a.fib on eliquis, CKD 3, ILD with 2-3L chronic O2 requirement.  Pt in to ED with intermittent confusion over the past few days.  Has had intermittent UTI symptoms and findings ongoing for past several months.  UTI seems to reoccur every time ABx are stopped.  Currently on macrobid.  Per family she also has been having SOB with worsening of cough from baseline with increased sputum production as well.  Had steroid injection into knee x2 days ago.  But no erythema, increased pain or swelling of knee, etc.  Review of Systems: As mentioned in the history of present illness. All other systems reviewed and are negative. Past Medical History:  Diagnosis Date   Arthritis    fingers   CKD (chronic kidney disease) stage 3, GFR 30-59 ml/min (HCC) 11/30/2017   Depression    Dizziness    in AM, getting out of bed   Dysrhythmia    A fib   Endometrial ca ([place]) 11/30/2017   S/p surgury 1990's   GERD (gastroesophageal reflux disease) 11/30/2017   HLD (hyperlipidemia) 11/30/2017   Hypercholesteremia    Hypertension    Interstitial lung disease ([place])    Neuropathy    bilateral feet   Pneumonia 12/14/2021   Shortness of breath dyspnea    Sleep apnea    has CPAP, doesn't use   Umbilical hernia    Past Surgical History:  Procedure Laterality Date   ABDOMINAL HYSTERECTOMY     BROW LIFT Bilateral 07/15/2015   Procedure: BLEPHAROPLASTY;  Surgeon: Karle Starch, MD;  Location: [facility];  Service: Ophthalmology;  Laterality: Bilateral;   CATARACT EXTRACTION W/PHACO Right 12/31/2021   Procedure: CATARACT EXTRACTION PHACO AND INTRAOCULAR LENS  PLACEMENT ([place]) RIGHT;  Surgeon: Norvel Richards, MD;  Location: [facility];  Service: Ophthalmology;  Laterality: Right;  17.11 1:46.1   CHOLECYSTECTOMY     HAMMER TOE SURGERY     HERNIA REPAIR     KNEE ARTHROSCOPY Bilateral    PTOSIS REPAIR Bilateral 07/15/2015   Procedure: PTOSIS REPAIR;  Surgeon: Karle Starch, MD;  Location: [facility];  Service: Ophthalmology;  Laterality: Bilateral;  CPAP   TONSILLECTOMY     Social History:  reports that she quit smoking about 36 years ago. Her smoking use included cigarettes. She has a 10.00 pack-year smoking history. She has never used smokeless tobacco. She reports that she does not drink alcohol and does not use drugs.  Allergies  Allergen Reactions   Lipitor [Atorvastatin] Other (See Comments)    Memory issues   Requip [Ropinirole Hcl] Other (See Comments)    Pt reports feeling generally unwell on this medication    Family History  Problem Relation Age of Onset   Congestive Heart Failure Mother    Stroke Father    Parkinson's disease Father    Diabetes Son     Prior to Admission medications   Medication Sig Start Date End Date Taking? Authorizing Provider  amitriptyline (ELAVIL) 100 MG tablet TAKE 1/2 - 1 TABLET BY MOUTH AT BEDTIME FOR SLEEP AND FEET PAIN Patient taking differently: Take 50-100 mg by mouth at  bedtime. 09/28/21  Yes Biagio Borg, MD  citalopram (CELEXA) 10 MG tablet Take 1 tablet (10 mg total) by mouth daily. 02/09/22 02/09/23 Yes Biagio Borg, MD  fenofibrate micronized (LOFIBRA) 134 MG capsule TAKE 1 CAPSULE BY MOUTH EVERY DAY IN THE MORNING BEFORE BREAKFAST Patient taking differently: Take 134 mg by mouth daily before breakfast. 04/12/22  Yes Biagio Borg, MD  furosemide (LASIX) 40 MG tablet Take 1 tablet (40 mg total) by mouth every other day. 1 tab by mouth in the AM, and 1 tab by mouth in the PM as needed for persistent swelling or weight gain more than 3-5 lbs 12/04/21  Yes Nita Sells, MD  losartan (COZAAR) 100 MG tablet TAKE 1/2 (ONE HALF) TABLET BY MOUTH ONCE DAILY Patient taking differently: Take 50 mg by mouth daily. 01/15/22  Yes Biagio Borg, MD  meloxicam (MOBIC) 15 MG tablet Take 1 tablet (15 mg total) by mouth daily. 08/02/22  Yes Biagio Borg, MD  metoprolol succinate (TOPROL-XL) 50 MG 24 hr tablet TAKE 1 TABLET BY MOUTH DAILY. TAKE WITH OR IMMEDIATELY FOLLOWING A MEAL. 05/03/22  Yes Biagio Borg, MD  nitrofurantoin, macrocrystal-monohydrate, (MACROBID) 100 MG capsule Take 1 capsule (100 mg total) by mouth 2 (two) times daily. 08/06/22  Yes Biagio Borg, MD  pantoprazole (PROTONIX) 40 MG tablet TAKE 1 TABLET BY MOUTH EVERY DAY 04/12/22  Yes Biagio Borg, MD  albuterol (VENTOLIN HFA) 108 (90 Base) MCG/ACT inhaler Inhale 1-2 puffs into the lungs every 6 (six) hours as needed for wheezing or shortness of breath. 03/23/21   Biagio Borg, MD  apixaban (ELIQUIS) 5 MG TABS tablet Take 1 tablet (5 mg total) by mouth 2 (two) times daily. 03/23/21   Biagio Borg, MD  Cholecalciferol (VITAMIN D-3) 25 MCG (1000 UT) CAPS Take 1 capsule by mouth daily.    [provider]  cyanocobalamin (VITAMIN B12) 1000 MCG/ML injection INJECT 1 ML INTO MUSCLE EVERY 30 DAYS 05/11/22   Biagio Borg, MD  diltiazem (CARDIZEM) 120 MG tablet Take 120 mg by mouth daily.    [provider]  Respiratory Therapy Supplies (FLUTTER) DEVI Use as directed 02/13/19   Lauraine Rinne, NP  triamcinolone cream (KENALOG) 0.5 % Apply 1 Application topically 2 (two) times daily as needed. 06/29/22   Biagio Borg, MD  UNABLE TO FIND STUDY - ASPIRE - apixaban 5 mg or placebo tablet (PI-Sethi) Per instructions TWICE A DAY (route: oral)    [provider]    Physical Exam: Vitals:   08/14/22 0015 08/14/22 0030 08/14/22 0045 08/14/22 0300  BP: (!) 139/90 132/88 136/84 134/71  Pulse: (!) 121 (!) 120 (!) 112 (!) 114  Resp: 17 (!) 23 (!) 23 19  Temp:      TempSrc:      SpO2: 98% 99%  98% 98%  Weight:      Height:       Constitutional: NAD, calm, comfortable Respiratory: clear to auscultation bilaterally, no wheezing, no crackles. Normal respiratory effort. No accessory muscle use.  Cardiovascular: IRR, IRR Abdomen: no tenderness, no masses palpated. No hepatosplenomegaly. Bowel sounds positive.  Musculoskeletal: B knees without obvious erythema, not TTP, not painful with ROM. Neurologic: CN 2-12 grossly intact. Sensation intact, DTR normal. Strength 5/5 in all 4.  Psychiatric: Normal judgment and insight. Alert and oriented x 3. Normal mood.   Data Reviewed:       Latest Ref Rng & Units 08/13/2022  3:41 PM 08/03/2022    3:38 PM 07/05/2022   10:16 AM  CBC  WBC 4.0 - 10.5 K/uL 13.1  8.4  7.6   Hemoglobin 12.0 - 15.0 g/dL 11.3  12.0  12.9   Hematocrit 36.0 - 46.0 % 34.3  36.0  38.4   Platelets 150 - 400 K/uL 191  250.0  248.0       Latest Ref Rng & Units 08/13/2022    3:41 PM 08/03/2022    3:38 PM 07/05/2022   10:16 AM  CMP  Glucose 70 - 99 mg/dL 253  146  97   BUN 8 - 23 mg/dL 32  24  23   Creatinine 0.44 - 1.00 mg/dL 1.36  1.29  1.08   Sodium 135 - 145 mmol/L 137  142  140   Potassium 3.5 - 5.1 mmol/L 4.6  4.3  4.4   Chloride 98 - 111 mmol/L 98  99  102   CO2 22 - 32 mmol/L 28  33  30   Calcium 8.9 - 10.3 mg/dL 9.5  9.7  9.6   Total Protein 6.5 - 8.1 g/dL 7.1  7.9  7.5   Total Bilirubin 0.3 - 1.2 mg/dL 0.4  0.6  0.6   Alkaline Phos 38 - 126 U/L 48  46  45   AST 15 - 41 U/L 36  24  25   ALT 0 - 44 U/L 19  13  11    $ UCx from 08/03/22 growing E.coli: ESBL sensitive only to imipenem and nitrofurantoin  CT C/A/P: IMPRESSION: 1. 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 Small triangular subpleural consolidation in the right upper lobe posteriorly may represent postobstructive atelectasis or infiltrate. Follow-up to resolution recommended. 2. No acute intra-abdominal or pelvic pathology. 3. Severe sigmoid diverticulosis. No bowel  obstruction. Normal appendix. 4.  Aortic Atherosclerosis (ICD10-I70.0).  Assessment and Plan: * Atrial fibrillation with RVR (HCC) Cardizem gtt Cont home eliquis Remainder of home med-rec is pending at this time  Community acquired pneumonia Suspect RUL PNA given reported symptoms and CT findings. Will continue merrem for the moment given recent ESBL in urine just this past week + ongoing confusion + SIRS Though UA today is negative. Add azithromycin for atypical coverage of PNA.  UTI due to extended-spectrum beta lactamase (ESBL) producing Escherichia coli UA today unimpressive. ? Partially treated UTI with macrobid given the sensitivity? EDP started pt on merrem before UA was back given concern for SIRS with a.fib RVR + WBC + confusion. Ill leave this alone for the moment since I'm treating PNA anyhow.  CKD (chronic kidney disease) stage 3, GFR 30-59 ml/min (HCC) Creat today looks to be about baseline.  ILD (interstitial lung disease) ([place]) Pt with h/o chronic hypersensitivity pneumonitis. On baseline O2 via [place] Looks like fibrosis is progressing however. Needs follow up with pulm at a minimum Day team may even wish to get IP pulm consult during stay.  Essential hypertension Med rec pending at this time.      Advance Care Planning:   Code Status: Full Code  Consults: None  Family Communication: Family at bedside  Severity of Illness: The appropriate patient status for this patient is OBSERVATION. Observation status is judged to be reasonable and necessary in order to provide the required intensity of service to ensure the patient's safety. The patient's presenting symptoms, physical exam findings, and initial radiographic and laboratory data in the context of their medical condition is felt to place them at  decreased risk for further clinical deterioration. Furthermore, it is anticipated that the patient will be medically stable for discharge from the hospital within 2  midnights of admission.   Author: Etta Quill., DO 08/14/2022 3:30 AM  For on call review [email or web address].

## 2022-08-14 NOTE — Assessment & Plan Note (Addendum)
Pt with h/o chronic hypersensitivity pneumonitis. On baseline O2 via [place] Looks like fibrosis is progressing however. Needs follow up with pulm at a minimum Day team may even wish to get IP pulm consult during stay.

## 2022-08-14 NOTE — Progress Notes (Signed)
Notified hospitalist of hr sustaining 80-90. Ok to shut cardizem off.

## 2022-08-14 NOTE — ED Notes (Signed)
Pt resting, rise/fall of chest noted. No acute distress noted

## 2022-08-14 NOTE — Assessment & Plan Note (Signed)
UA today unimpressive. ? Partially treated UTI with macrobid given the sensitivity? EDP started pt on merrem before UA was back given concern for SIRS with a.fib RVR + WBC + confusion. Ill leave this alone for the moment since I'm treating PNA anyhow.

## 2022-08-14 NOTE — ED Notes (Signed)
ED TO INPATIENT HANDOFF REPORT  ED Nurse Name and Phone #: [phone]  S Name/Age/Gender Kathy [name] 82 y.o. female Room/Bed: 040C/040C  Code Status   Code Status: Full Code  Home/SNF/Other Home Patient oriented to: self, place, and time Is this baseline? Yes      Chief Complaint Atrial fibrillation with RVR ([place]) [I48.91] CAP (community acquired pneumonia) [J18.9]  Triage Note PER EMS: pt is from home with c/o confusion that started today "this afternoon" per family report to EMS. Baseline mentation is A&Ox4. Initial complaint was trouble breathing, but ems arrived and pts RR was 24. 2L [place] at baseline, hx of recurrent UTIs, is taking Macobid. Pt is A&Ox3, answered year incorrectly. 136/86, HR-136 afib, CBG-336    Allergies Allergies  Allergen Reactions   Lipitor [Atorvastatin] Other (See Comments)    Memory issues   Requip [Ropinirole Hcl] Other (See Comments)    Pt reports feeling generally unwell on this medication    Level of Care/Admitting Diagnosis ED Disposition     ED Disposition  Admit   Condition  --   [place]: [place] [[id number]]  Level of Care: Telemetry Cardiac [103]  May admit patient to [facility] or [facility] if equivalent level of care is available:: No  Covid Evaluation: Confirmed COVID Negative  Diagnosis: CAP (community acquired pneumoniaUT:[id number]  Admitting Physician: Caren Griffins [phone]  Attending Physician: Caren Griffins [id number]  Certification:: I certify this patient will need inpatient services for at least 2 midnights  Estimated Length of Stay: 4          B Medical/Surgery History Past Medical History:  Diagnosis Date   Arthritis    fingers   CKD (chronic kidney disease) stage 3, GFR 30-59 ml/min ([place]) 11/30/2017   Depression    Dizziness    in AM, getting out of bed   Dysrhythmia    A fib   Endometrial ca ([place]) 11/30/2017   S/p surgury 1990's   GERD (gastroesophageal  reflux disease) 11/30/2017   HLD (hyperlipidemia) 11/30/2017   Hypercholesteremia    Hypertension    Interstitial lung disease ([place])    Neuropathy    bilateral feet   Pneumonia 12/14/2021   Shortness of breath dyspnea    Sleep apnea    has CPAP, doesn't use   Umbilical hernia    Past Surgical History:  Procedure Laterality Date   ABDOMINAL HYSTERECTOMY     BROW LIFT Bilateral 07/15/2015   Procedure: BLEPHAROPLASTY;  Surgeon: Karle Starch, MD;  Location: [facility];  Service: Ophthalmology;  Laterality: Bilateral;   CATARACT EXTRACTION W/PHACO Right 12/31/2021   Procedure: CATARACT EXTRACTION PHACO AND INTRAOCULAR LENS PLACEMENT ([place]) RIGHT;  Surgeon: Norvel Richards, MD;  Location: [facility];  Service: Ophthalmology;  Laterality: Right;  17.11 1:46.1   CHOLECYSTECTOMY     HAMMER TOE SURGERY     HERNIA REPAIR     KNEE ARTHROSCOPY Bilateral    PTOSIS REPAIR Bilateral 07/15/2015   Procedure: PTOSIS REPAIR;  Surgeon: Karle Starch, MD;  Location: [facility];  Service: Ophthalmology;  Laterality: Bilateral;  CPAP   TONSILLECTOMY       A IV Location/Drains/Wounds Patient Lines/Drains/Airways Status     Active Line/Drains/Airways     Name Placement date Placement time Site Days   Peripheral IV 08/13/22 22 G 2.5" Right;Lateral Forearm 08/13/22  2145  Forearm  1   Peripheral IV 08/13/22 20 G 2.5" Left;Upper Arm 08/13/22  2215  Arm  1   Incision (Closed) 12/31/21 Eye Right 12/31/21  1329  -- 226            Intake/Output Last 24 hours  Intake/Output Summary (Last 24 hours) at 08/14/2022 1448 Last data filed at 08/14/2022 [id number] Gross per 24 hour  Intake 1375.53 ml  Output --  Net 1375.53 ml    Labs/Imaging Results for orders placed or performed during the hospital encounter of 08/13/22 (from the past 48 hour(s))  Comprehensive metabolic panel     Status: Abnormal   Collection Time: 08/13/22  3:41 PM  Result Value Ref Range   Sodium 137 135  - 145 mmol/L   Potassium 4.6 3.5 - 5.1 mmol/L   Chloride 98 98 - 111 mmol/L   CO2 28 22 - 32 mmol/L   Glucose, Bld 253 (H) 70 - 99 mg/dL    Comment: Glucose reference range applies only to samples taken after fasting for at least 8 hours.   BUN 32 (H) 8 - 23 mg/dL   Creatinine, Ser 1.36 (H) 0.44 - 1.00 mg/dL   Calcium 9.5 8.9 - 10.3 mg/dL   Total Protein 7.1 6.5 - 8.1 g/dL   Albumin 3.0 (L) 3.5 - 5.0 g/dL   AST 36 15 - 41 U/L   ALT 19 0 - 44 U/L   Alkaline Phosphatase 48 38 - 126 U/L   Total Bilirubin 0.4 0.3 - 1.2 mg/dL   GFR, Estimated 39 (L) >60 mL/min    Comment: (NOTE) Calculated using the CKD-EPI Creatinine Equation (2021)    Anion gap 11 5 - 15    Comment: Performed at [facility] [address] [place], [place] [address]  CBC     Status: Abnormal   Collection Time: 08/13/22  3:41 PM  Result Value Ref Range   WBC 13.1 (H) 4.0 - 10.5 K/uL   RBC 3.41 (L) 3.87 - 5.11 MIL/uL   Hemoglobin 11.3 (L) 12.0 - 15.0 g/dL   HCT 34.3 (L) 36.0 - 46.0 %   MCV 100.6 (H) 80.0 - 100.0 fL   MCH 33.1 26.0 - 34.0 pg   MCHC 32.9 30.0 - 36.0 g/dL   RDW 13.3 11.5 - 15.5 %   Platelets 191 150 - 400 K/uL   nRBC 0.0 0.0 - 0.2 %    Comment: Performed at [facility], [place] [address], [place], [place] [address]  Lipase, blood     Status: None   Collection Time: 08/13/22  3:41 PM  Result Value Ref Range   Lipase 28 11 - 51 U/L    Comment: Performed at [facility], [place] [address], [place], [place] [address]  Troponin I (High Sensitivity)     Status: None   Collection Time: 08/13/22  3:41 PM  Result Value Ref Range   Troponin I (High Sensitivity) 13 <18 ng/L    Comment: (NOTE) Elevated high sensitivity troponin I (hsTnI) values and significant  changes across serial measurements may suggest ACS but many other  chronic and acute conditions are known to elevate hsTnI results.  Refer to the Links section for chest pain algorithms and additional  guidance. Performed at [facility], [place] [address] [place], [place] [address]   CBG monitoring, ED     Status: Abnormal   Collection Time: 08/13/22  4:09 PM  Result Value Ref Range   Glucose-Capillary 242 (H) 70 - 99 mg/dL    Comment: Glucose reference  range applies only to samples taken after fasting for at least 8 hours.  Resp panel by RT-PCR (RSV, Flu A&B, Covid) Anterior Nasal Swab     Status: None   Collection Time: 08/13/22  4:19 PM   Specimen: Anterior Nasal Swab  Result Value Ref Range   SARS Coronavirus 2 by RT PCR NEGATIVE NEGATIVE   Influenza A by PCR NEGATIVE NEGATIVE   Influenza B by PCR NEGATIVE NEGATIVE    Comment: (NOTE) The Xpert Xpress SARS-CoV-2/FLU/RSV plus assay is intended as an aid in the diagnosis of influenza from Nasopharyngeal swab specimens and should not be used as a sole basis for treatment. Nasal washings and aspirates are unacceptable for Xpert Xpress SARS-CoV-2/FLU/RSV testing.  Fact Sheet for Patients: EntrepreneurPulse.com.au  Fact Sheet for Healthcare Providers: IncredibleEmployment.be  This test is not yet approved or cleared by the [place] FDA and has been authorized for detection and/or diagnosis of SARS-CoV-2 by FDA under an Emergency Use Authorization (EUA). This EUA will remain in effect (meaning this test can be used) for the duration of the COVID-19 declaration under Section 564(b)(1) of the Act, 21 U.S.C. section 360bbb-3(b)(1), unless the authorization is terminated or revoked.     Resp Syncytial Virus by PCR NEGATIVE NEGATIVE    Comment: (NOTE) Fact Sheet for Patients: EntrepreneurPulse.com.au  Fact Sheet for Healthcare Providers: IncredibleEmployment.be  This test is not yet approved or cleared by the [place] FDA and has been authorized for detection and/or diagnosis of SARS-CoV-2 by FDA under an Emergency Use Authorization (EUA). This EUA will remain in effect  (meaning this test can be used) for the duration of the COVID-19 declaration under Section 564(b)(1) of the Act, 21 U.S.C. section 360bbb-3(b)(1), unless the authorization is terminated or revoked.  Performed at [facility], [place] [address] [place], [place] [address]   Brain natriuretic peptide     Status: Abnormal   Collection Time: 08/13/22  4:19 PM  Result Value Ref Range   B Natriuretic Peptide 164.2 (H) 0.0 - 100.0 pg/mL    Comment: Performed at [facility] [address], [place], [place] [address]  Lactic acid, plasma     Status: Abnormal   Collection Time: 08/13/22  5:52 PM  Result Value Ref Range   Lactic Acid, Venous 3.0 (HH) 0.5 - 1.9 mmol/L    Comment: CRITICAL RESULT CALLED TO, READ BACK BY AND VERIFIED WITH Glade Stanford RN 08/13/2022 Carlye Grippe Performed at [facility], [place] [address], [place], [place] [address]   Blood culture (routine x 2)     Status: None (Preliminary result)   Collection Time: 08/13/22  5:52 PM   Specimen: BLOOD  Result Value Ref Range   Specimen Description BLOOD LEFT ANTECUBITAL    Special Requests      BOTTLES DRAWN AEROBIC AND ANAEROBIC Blood Culture results may not be optimal due to an inadequate volume of blood received in culture bottles   Culture      NO GROWTH < 24 HOURS Performed at [facility] [address], [place], [place] [address]    Report Status PENDING   Blood culture (routine x 2)     Status: None (Preliminary result)   Collection Time: 08/13/22  5:57 PM   Specimen: BLOOD  Result Value Ref Range   Specimen Description BLOOD RIGHT ANTECUBITAL    Special Requests      BOTTLES DRAWN AEROBIC AND ANAEROBIC Blood Culture results may not be optimal  due to an inadequate volume of blood received in culture bottles   Culture      NO GROWTH < 24 HOURS Performed at [facility] [address] [place], [place] [address]    Report Status PENDING   Troponin I (High Sensitivity)     Status: None    Collection Time: 08/13/22  6:15 PM  Result Value Ref Range   Troponin I (High Sensitivity) 14 <18 ng/L    Comment: (NOTE) Elevated high sensitivity troponin I (hsTnI) values and significant  changes across serial measurements may suggest ACS but many other  chronic and acute conditions are known to elevate hsTnI results.  Refer to the "Links" section for chest pain algorithms and additional  guidance. Performed at [facility], [place] [address] [place], [place] [address]   Urinalysis, Routine w reflex microscopic -Urine, Clean Catch     Status: None   Collection Time: 08/13/22 10:30 PM  Result Value Ref Range   Color, Urine YELLOW YELLOW   APPearance CLEAR CLEAR   Specific Gravity, Urine 1.019 1.005 - 1.030   pH 5.0 5.0 - 8.0   Glucose, UA NEGATIVE NEGATIVE mg/dL   Hgb urine dipstick NEGATIVE NEGATIVE   Bilirubin Urine NEGATIVE NEGATIVE   Ketones, ur NEGATIVE NEGATIVE mg/dL   Protein, ur NEGATIVE NEGATIVE mg/dL   Nitrite NEGATIVE NEGATIVE   Leukocytes,Ua NEGATIVE NEGATIVE    Comment: Performed at [facility] [address], [place], [place] [address]  Lactic acid, plasma     Status: None   Collection Time: 08/13/22 10:38 PM  Result Value Ref Range   Lactic Acid, Venous 1.2 0.5 - 1.9 mmol/L    Comment: Performed at [facility] [address] [place], [place] [address]  Urinalysis, w/ Reflex to Culture (Infection Suspected) -Urine, Clean Catch     Status: Abnormal   Collection Time: 08/14/22  2:15 AM  Result Value Ref Range   Specimen Source URINE, CLEAN CATCH    Color, Urine YELLOW YELLOW   APPearance CLEAR CLEAR   Specific Gravity, Urine 1.021 1.005 - 1.030   pH 6.0 5.0 - 8.0   Glucose, UA 50 (A) NEGATIVE mg/dL   Hgb urine dipstick SMALL (A) NEGATIVE   Bilirubin Urine NEGATIVE NEGATIVE   Ketones, ur NEGATIVE NEGATIVE mg/dL   Protein, ur NEGATIVE NEGATIVE mg/dL   Nitrite NEGATIVE NEGATIVE   Leukocytes,Ua NEGATIVE NEGATIVE   RBC / HPF 0-5 0 - 5  RBC/hpf   WBC, UA 0-5 0 - 5 WBC/hpf    Comment:        Reflex urine culture not performed if WBC <=10, OR if Squamous epithelial cells >5. If Squamous epithelial cells >5 suggest recollection.    Bacteria, UA NONE SEEN NONE SEEN   Squamous Epithelial / HPF 0-5 0 - 5 /HPF   Mucus PRESENT    Hyaline Casts, UA PRESENT     Comment: Performed at [facility], [place] [address] [place], [place] [address]  CBC     Status: Abnormal   Collection Time: 08/14/22  7:27 AM  Result Value Ref Range   WBC 19.1 (H) 4.0 - 10.5 K/uL   RBC 3.06 (L) 3.87 - 5.11 MIL/uL   Hemoglobin 10.0 (L) 12.0 - 15.0 g/dL   HCT 31.2 (L) 36.0 - 46.0 %   MCV 102.0 (H) 80.0 - 100.0 fL   MCH 32.7 26.0 - 34.0 pg   MCHC 32.1 30.0 - 36.0 g/dL  RDW 13.5 11.5 - 15.5 %   Platelets 222 150 - 400 K/uL   nRBC 0.0 0.0 - 0.2 %    Comment: Performed at [facility], [place] [address], [place], [place] [id number]  Basic metabolic panel     Status: Abnormal   Collection Time: 08/14/22  7:27 AM  Result Value Ref Range   Sodium 135 135 - 145 mmol/L   Potassium 5.4 (H) 3.5 - 5.1 mmol/L   Chloride 98 98 - 111 mmol/L   CO2 27 22 - 32 mmol/L   Glucose, Bld 141 (H) 70 - 99 mg/dL    Comment: Glucose reference range applies only to samples taken after fasting for at least 8 hours.   BUN 31 (H) 8 - 23 mg/dL   Creatinine, Ser 1.15 (H) 0.44 - 1.00 mg/dL   Calcium 8.9 8.9 - 10.3 mg/dL   GFR, Estimated 48 (L) >60 mL/min    Comment: (NOTE) Calculated using the CKD-EPI Creatinine Equation (2021)    Anion gap 10 5 - 15    Comment: Performed at [facility] [address], [place], [place] [address]   CT CHEST ABDOMEN PELVIS W CONTRAST  Result Date: 08/14/2022 CLINICAL DATA:  Sepsis. EXAM: CT CHEST, ABDOMEN, AND PELVIS WITH CONTRAST TECHNIQUE: Multidetector CT imaging of the chest, abdomen and pelvis was performed following the standard protocol during bolus administration of intravenous contrast. RADIATION DOSE REDUCTION:  This exam was performed according to the departmental dose-optimization program which includes automated exposure control, adjustment of the mA and/or kV according to patient size and/or use of iterative reconstruction technique. CONTRAST:  21m OMNIPAQUE IOHEXOL 350 MG/ML SOLN COMPARISON:  CT of the chest dated 02/08/2020. CT abdomen pelvis dated 05/23/2018. FINDINGS: Evaluation is limited due to streak artifact caused by patient's arms. CT CHEST FINDINGS Cardiovascular: Borderline cardiomegaly. No pericardial effusion. Coronary vascular calcification of the LAD. There is mild atherosclerotic calcification of the thoracic aorta. No aneurysmal dilatation or dissection. The origins of the great vessels of the aortic arch and the central pulmonary arteries appear patent. Mediastinum/Nodes: No hilar or mediastinal adenopathy. The esophagus is grossly unremarkable. No mediastinal fluid collection. Lungs/Pleura: Bilateral interstitial coarsening progressed since the prior CT. Small triangular subpleural consolidation in the right upper lobe posteriorly may represent postobstructive atelectasis or infiltrate. Follow-up to resolution recommended. There is no pleural effusion or pneumothorax. The central airways remain patent. Musculoskeletal: Degenerative changes of the shoulders and spine. No acute osseous pathology. CT ABDOMEN PELVIS FINDINGS No intra-abdominal free air or free fluid. Hepatobiliary: Probable fatty liver. No biliary dilatation. Cholecystectomy. Pancreas: The pancreas is grossly unremarkable. Spleen: Normal in size without focal abnormality. Adrenals/Urinary Tract: The adrenal glands are unremarkable. There is no hydronephrosis on either side. There is symmetric enhancement and excretion of contrast by both kidneys. Bilateral extrarenal pelvis with pelviectasis. The visualized ureters and urinary bladder appear unremarkable. Stomach/Bowel: There is severe sigmoid diverticulosis without active inflammatory  changes. There is no bowel obstruction or active inflammation. The appendix is normal. Vascular/Lymphatic: Mild aortoiliac atherosclerotic disease. The IVC is unremarkable. No portal venous gas. There is no adenopathy. Reproductive: Hysterectomy.  No adnexal masses. Other: Midline vertical anterior abdominal wall incisional scar. There is a ventral hernia repair mesh. Musculoskeletal: Degenerative changes of the spine. No acute osseous pathology. IMPRESSION: 1. 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 Small triangular subpleural consolidation in the right upper lobe posteriorly may represent postobstructive atelectasis or infiltrate. Follow-up to resolution recommended. 2. No acute  intra-abdominal or pelvic pathology. 3. Severe sigmoid diverticulosis. No bowel obstruction. Normal appendix. 4.  Aortic Atherosclerosis (ICD10-I70.0). Electronically Signed   By: Anner Crete M.D.   On: 08/14/2022 01:43   DG Chest Portable 1 View  Result Date: 08/13/2022 CLINICAL DATA:  Altered mental status EXAM: PORTABLE CHEST 1 VIEW COMPARISON:  05/31/2022 FINDINGS: Rotated AP portable examination. Cardiomegaly. Diffuse bilateral interstitial pulmonary opacity. The visualized skeletal structures are unremarkable. IMPRESSION: Rotated AP portable examination. Cardiomegaly with diffuse bilateral interstitial pulmonary opacity, likely edema. No focal airspace opacity. Electronically Signed   By: Delanna Ahmadi M.D.   On: 08/13/2022 16:41    Pending Labs Unresulted Labs (From admission, onward)     Start     Ordered   08/14/22 0024  Urinalysis, Routine w reflex microscopic -Urine, Clean Catch  Once,   URGENT       Question:  Specimen Source  Answer:  Urine, Clean Catch   08/14/22 0023            Vitals/Pain Today's Vitals   08/14/22 0600 08/14/22 0700 08/14/22 1034 08/14/22 1034  BP: 120/77 122/81  114/81  Pulse: (!) 110 (!) 103  (!) 106  Resp: 16 (!) 21  18  Temp:    (!) 97.5 F (36.4  C)  TempSrc:    Oral  SpO2: 93% (!) 84%  99%  Weight:      Height:      PainSc:   0-No pain 0-No pain    Isolation Precautions No active isolations  Medications Medications  diltiazem (CARDIZEM) 1 mg/mL load via infusion 10 mg (10 mg Intravenous Bolus from Bag 08/13/22 1801)    And  diltiazem (CARDIZEM) 125 mg in dextrose 5% 125 mL (1 mg/mL) infusion (5 mg/hr Intravenous New Bag/Given 08/14/22 1336)  meropenem (MERREM) 1 g in sodium chloride 0.9 % 100 mL IVPB (0 g Intravenous Stopped 08/14/22 1317)  acetaminophen (TYLENOL) tablet 650 mg (650 mg Oral Given 08/14/22 0449)  ondansetron (ZOFRAN) injection 4 mg (has no administration in time range)  apixaban (ELIQUIS) tablet 5 mg (5 mg Oral Given 08/14/22 1031)  azithromycin (ZITHROMAX) 500 mg in sodium chloride 0.9 % 250 mL IVPB (0 mg Intravenous Stopped 08/14/22 0718)  metoprolol succinate (TOPROL-XL) 24 hr tablet 50 mg (50 mg Oral Given 08/14/22 1031)  amitriptyline (ELAVIL) tablet 50 mg (has no administration in time range)  citalopram (CELEXA) tablet 10 mg (10 mg Oral Given 08/14/22 1030)  pantoprazole (PROTONIX) EC tablet 40 mg (40 mg Oral Given 08/14/22 1031)  albuterol (PROVENTIL) (2.5 MG/3ML) 0.083% nebulizer solution 3 mL (has no administration in time range)  lactated ringers bolus 500 mL (0 mLs Intravenous Stopped 08/14/22 0138)  lactated ringers bolus 1,000 mL (0 mLs Intravenous Stopped 08/14/22 0402)  iohexol (OMNIPAQUE) 350 MG/ML injection 75 mL (75 mLs Intravenous Contrast Given 08/14/22 0125)    Mobility walks with device     Focused Assessments Pt have AMS   R Recommendations: See Admitting Provider Note  Report given to:   Additional Notes: Pt is from home with her daughter. Pt daughter reported her mom uses a walker or wheelchair. She is on a Cardizem drip. Dr Cruzita Lederer wanted me to give the patient her Metoprolol and wean her off the drip. Right now I have it at 70m/hr. Pt HR is now 88 and her blood pressure is 115/71

## 2022-08-14 NOTE — Progress Notes (Signed)
No charge note  Patient seen and examined this morning, was admitted overnight, H&P reviewed and I agree with the assessment and plan.  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  Daughter tells me that she initially started to be confused around last Thanksgiving, was diagnosed with a UTI and her mental status cleared with treatment.  Had recurrent UTI in January which was treated, microbiology showed ESBL E. coli.  She has been having intermittent confusion since, more so in the last few days, and she was brought to the ER.  Daughter also reports worsening shortness of breath and increased cough in the last few days.  She has chronic knee problems and had steroid injection in to the knees couple days ago.  In the ED she was found to be in A-fib with RVR and started on Cardizem drip  Patient doing well on my evaluation, daughter is feeding her breakfast.  Remains confused but alert and appropriate  Principal problem Acute metabolic encephalopathy, likely due to community-acquired pneumonia -patient's imaging shows suspicious for right upper lobe pneumonia.  She is symptomatic with cough, congestion, and has a significant leukocytosis.  Has been placed on broad-spectrum antibiotics, continue for now.  Monitor blood cultures -Given her intermittent confusion even outside periods when she didn't have a clear cut infection, obtain MRI of the brain.   Active problems A-fib with RVR -currently on Cardizem infusion, resume home metoprolol and attempt to wean the infusion down.  Continue home Eliquis.  Recent ESBL E. coli UTI -urinalysis now clean.  Chronic kidney disease stage IIIa-creatinine looks at baseline  ILD, chronic hypoxic respiratory failure -respiratory status appears close to baseline  Essential  hypertension-resume metoprolol.  Hold ARB  Scheduled Meds:  amitriptyline  50 mg Oral QHS   apixaban  5 mg Oral BID   citalopram  10 mg Oral Daily   metoprolol succinate  50 mg Oral Daily   pantoprazole  40 mg Oral Daily   Continuous Infusions:  azithromycin Stopped (08/14/22 0718)   diltiazem (CARDIZEM) infusion 15 mg/hr (08/14/22 0359)   meropenem (MERREM) IV Stopped (08/13/22 2325)   PRN Meds:.acetaminophen, albuterol, ondansetron (ZOFRAN) IV  Nyari Olsson M. Cruzita Lederer, MD, PhD [facility]  Between 7 am - 7 pm you can contact me via Am[facility] (for emergencies) or [place] (non urgent matters).  I am not available 7 pm - 7 am, please contact night coverage MD/APP via Amion

## 2022-08-14 NOTE — ED Notes (Addendum)
Pt report received from previous nurse. Pt A&O x3, vitals stable, complains of headache. Call bell in reach. No acute distress noted. Family at bedside.

## 2022-08-15 DIAGNOSIS — I4891 Unspecified atrial fibrillation: Secondary | ICD-10-CM | POA: Diagnosis not present

## 2022-08-15 LAB — CBC
HCT: 35.3 % — ABNORMAL LOW (ref 36.0–46.0)
Hemoglobin: 11.2 g/dL — ABNORMAL LOW (ref 12.0–15.0)
MCH: 32.1 pg (ref 26.0–34.0)
MCHC: 31.7 g/dL (ref 30.0–36.0)
MCV: 101.1 fL — ABNORMAL HIGH (ref 80.0–100.0)
Platelets: 179 10*3/uL (ref 150–400)
RBC: 3.49 MIL/uL — ABNORMAL LOW (ref 3.87–5.11)
RDW: 13.7 % (ref 11.5–15.5)
WBC: 18.5 10*3/uL — ABNORMAL HIGH (ref 4.0–10.5)
nRBC: 0 % (ref 0.0–0.2)

## 2022-08-15 LAB — COMPREHENSIVE METABOLIC PANEL
ALT: 23 U/L (ref 0–44)
AST: 33 U/L (ref 15–41)
Albumin: 3 g/dL — ABNORMAL LOW (ref 3.5–5.0)
Alkaline Phosphatase: 49 U/L (ref 38–126)
Anion gap: 10 (ref 5–15)
BUN: 35 mg/dL — ABNORMAL HIGH (ref 8–23)
CO2: 26 mmol/L (ref 22–32)
Calcium: 9.3 mg/dL (ref 8.9–10.3)
Chloride: 99 mmol/L (ref 98–111)
Creatinine, Ser: 1.4 mg/dL — ABNORMAL HIGH (ref 0.44–1.00)
GFR, Estimated: 38 mL/min — ABNORMAL LOW (ref >60)
Glucose, Bld: 111 mg/dL — ABNORMAL HIGH (ref 70–99)
Potassium: 4.8 mmol/L (ref 3.5–5.1)
Sodium: 135 mmol/L (ref 135–145)
Total Bilirubin: 0.5 mg/dL (ref 0.3–1.2)
Total Protein: 7.2 g/dL (ref 6.5–8.1)

## 2022-08-15 LAB — MAGNESIUM: Magnesium: 2.2 mg/dL (ref 1.7–2.4)

## 2022-08-15 MED ORDER — ORAL CARE MOUTH RINSE: 15.0000 mL | OROMUCOSAL | Status: DC | PRN

## 2022-08-15 MED ORDER — SODIUM ZIRCONIUM CYCLOSILICATE 10 G PO PACK
10.0000 g | PACK | Freq: Once | ORAL | Status: AC
  Administered 2022-08-15: 10 g via ORAL
  Filled 2022-08-15: qty 1

## 2022-08-15 MED ORDER — DILTIAZEM HCL ER 90 MG PO CP12
90.0000 mg | ORAL_CAPSULE | Freq: Two times a day (BID) | ORAL | Status: DC
  Administered 2022-08-15 – 2022-08-21 (×12): 90 mg via ORAL
  Filled 2022-08-15 (×13): qty 1

## 2022-08-15 MED ORDER — DILTIAZEM HCL ER 60 MG PO CP12
60.0000 mg | ORAL_CAPSULE | Freq: Two times a day (BID) | ORAL | Status: DC
  Administered 2022-08-15: 60 mg via ORAL
  Filled 2022-08-15 (×2): qty 1

## 2022-08-15 NOTE — Evaluation (Signed)
Physical Therapy Evaluation Patient Details Name: Kathy [name] MRN: [id number] DOB: 10-06-1940 Today's Date: 08/15/2022  History of Present Illness  82 year old female admitted with AMS, SOB, Afib and increased cough.  Work up reveals community acquired PNA.  PMH significant for history of PAF, ILD with chronic hypoxic respiratory failure on 2-3 L at home, recurrent UTIs.  Daughter reports that she initially started to be confused around last Thanksgiving,  She has chronic knee problems and had steroid injection in to the knees couple days ago.  Clinical Impression  Patient presents with dependencies in gait and transfers due to generalized weakness, decreased balance, decreased cognition, and obesity  .  Patient will benefit from PT to progress mobility and independence.  Patient has good support at home but needs to be fairly independent with mobility to return home.  Recommend AIR to maximize potential for return home.       Recommendations for follow up therapy are one component of a multi-disciplinary discharge planning process, led by the attending physician.  Recommendations may be updated based on patient status, additional functional criteria and insurance authorization.  Follow Up Recommendations Acute inpatient rehab (3hours/day)      Assistance Recommended at Discharge Frequent or constant Supervision/Assistance  Patient can return home with the following  A lot of help with walking and/or transfers;A lot of help with bathing/dressing/bathroom;Assistance with cooking/housework;Direct supervision/assist for medications management;Direct supervision/assist for financial management    Equipment Recommendations None recommended by PT  Recommendations for Other Services  Rehab consult    Functional Status Assessment Patient has had a recent decline in their functional status and demonstrates the ability to make significant improvements in function in a reasonable and predictable amount  of time.     Precautions / Restrictions Precautions Precautions: Fall      Mobility  Bed Mobility Overal bed mobility: Needs Assistance Bed Mobility: Supine to Sit, Sit to Supine     Supine to sit: Mod assist Sit to supine: Mod assist, +2 for physical assistance   General bed mobility comments: unable to scoot up in bed - required 2 person assist; required increased cueing to stay on task    Transfers Overall transfer level: Needs assistance Equipment used: Rolling walker (2 wheels) Transfers: Sit to/from Stand, Bed to chair/wheelchair/BSC Sit to Stand: Min assist   Step pivot transfers: Min assist            Ambulation/Gait                  Stairs            Wheelchair Mobility    Modified Rankin (Stroke Patients Only)       Balance Overall balance assessment: Needs assistance Sitting-balance support: No upper extremity supported, Feet supported Sitting balance-Leahy Scale: Fair     Standing balance support: Bilateral upper extremity supported, During functional activity, Reliant on assistive device for balance Standing balance-Leahy Scale: Poor                               Pertinent Vitals/Pain Pain Assessment Pain Assessment: No/denies pain    Home Living Family/patient expects to be discharged to:: Private residence Living Arrangements: Spouse/significant other;Children Available Help at Discharge: Family;Available 24 hours/day Type of Home: House Home Access: Level entry       Home Layout: Two level;Able to live on main level with bedroom/bathroom Home Equipment: Shower seat - built in;Rollator (4 wheels);Wheelchair - [employer] -  single point Additional Comments: husband cannot physically lift patient    Prior Function Prior Level of Function : Independent/Modified Independent             Mobility Comments: Use rollator for mobiltiy tasks       Hand Dominance        Extremity/Trunk Assessment         Lower Extremity Assessment Lower Extremity Assessment: Generalized weakness       Communication   Communication: No difficulties  Cognition Arousal/Alertness: Awake/alert (difficult to arouse, but once awake, able to stay awake) Behavior During Therapy: [facility] for tasks assessed/performed Overall Cognitive Status: Impaired/Different from baseline Area of Impairment: Following commands                       Following Commands: Follows one step commands with increased time       General Comments: per daughter patient started with confusion/alerted mental status around Thanksgiving; easily distracted during session        General Comments      Exercises     Assessment/Plan    PT Assessment Patient needs continued PT services  PT Problem List Decreased strength;Decreased activity tolerance;Decreased balance;Decreased mobility;Decreased safety awareness;Cardiopulmonary status limiting activity       PT Treatment Interventions DME instruction;Gait training;Functional mobility training;Therapeutic activities;Patient/family education;Balance training    PT Goals (Current goals can be found in the Care Plan section)  Acute Rehab PT Goals Patient Stated Goal: "Go home" PT Goal Formulation: With patient/family Time For Goal Achievement: 08/29/22 Potential to Achieve Goals: Good    Frequency Min 3X/week     Co-evaluation               AM-PAC PT "6 Clicks" Mobility  Outcome Measure Help needed turning from your back to your side while in a flat bed without using bedrails?: A Little Help needed moving from lying on your back to sitting on the side of a flat bed without using bedrails?: A Little Help needed moving to and from a bed to a chair (including a wheelchair)?: A Little Help needed standing up from a chair using your arms (e.g., wheelchair or bedside chair)?: A Little Help needed to walk in hospital room?: A Lot Help needed climbing 3-5 steps with a  railing? : A Lot 6 Click Score: 16    End of Session Equipment Utilized During Treatment: Gait belt Activity Tolerance: Patient tolerated treatment well Patient left: in bed;with call bell/phone within reach;with bed alarm set;with family/visitor present Nurse Communication: Mobility status PT Visit Diagnosis: Other abnormalities of gait and mobility (R26.89);Muscle weakness (generalized) (M62.81);History of falling (Z91.81);Difficulty in walking, not elsewhere classified (R26.2)    Time: [id number] PT Time Calculation (min) (ACUTE ONLY): 28 min   Charges:   PT Evaluation $PT Eval Moderate Complexity: 1 Mod PT Treatments $Therapeutic Activity: 8-22 mins        08/15/2022 Margie, PT Acute Rehabilitation Services Office:  [phone]   Shanna Cisco 08/15/2022, 2:05 PM

## 2022-08-15 NOTE — Progress Notes (Signed)
PROGRESS NOTE  Kathy [name] [id number] DOB: [birth year] DOA: 08/13/2022 PCP: Biagio Borg, MD   LOS: 1 day   Brief Narrative / Interim history:  82 year old female with history of PAF on Eliquis, CKD 3A with baseline creatinine 1.2-1.4, ILD with chronic hypoxic respiratory failure on 2-3 L at home, recurrent UTIs who comes to the hospital with altered mental status.  Daughter tells me that she initially started to be confused around last Thanksgiving, was diagnosed with a UTI and her mental status cleared with treatment.  Had recurrent UTI in January which was treated, microbiology showed ESBL E. coli.  She has been having intermittent confusion since, more so in the last few days, and she was brought to the ER.  Daughter also reports worsening shortness of breath and increased cough in the last few days.  She has chronic knee problems and had steroid injection in to the knees couple days ago.  In the ED she was found to be in A-fib with RVR and started on Cardizem drip   Subjective / 24h Interval events: Complains of a significant coughing spell this morning. No chest pain, no abdominal pain, no nausea or vomiting. Daughter is at bedside, tell me that she was very confused, impulsive and combative overnight which is very unusual for her.   Assesement and Plan: Principal Problem:   Atrial fibrillation with RVR ([place]) Active Problems:   Community acquired pneumonia   UTI due to extended-spectrum beta lactamase (ESBL) producing Escherichia coli   Essential hypertension   ILD (interstitial lung disease) (HCC)   CKD (chronic kidney disease) stage 3, GFR 30-59 ml/min (HCC)   CAP (community acquired pneumonia)   Principal problem Acute metabolic encephalopathy, likely due to community-acquired pneumonia -patient's imaging shows suspicious for right upper lobe pneumonia.  She is symptomatic with cough, congestion, and has a significant leukocytosis.  Has been placed on broad-spectrum antibiotics,  continue for now.  Monitor blood cultures -Given her intermittent confusion even outside periods when she didn't have a clear cut infection, an MRI of the brain was obtained and it was fairly unremarkable without acute or subacute infarct   Active problems A-fib with RVR -off cardizem infusion, on metoprolol and po diltiazem. Rates in the 120s at rest. Uptitrate meds this afternoon   Recent ESBL E. coli UTI -urinalysis now clean.   Chronic kidney disease stage IIIa-creatinine looks at baseline   ILD, chronic hypoxic respiratory failure -respiratory status appears close to baseline   Essential hypertension-resume metoprolol.  Hold ARB  Scheduled Meds:  amitriptyline  50 mg Oral QHS   apixaban  5 mg Oral BID   citalopram  10 mg Oral Daily   diltiazem  60 mg Oral Q12H   metoprolol succinate  50 mg Oral Daily   pantoprazole  40 mg Oral Daily   Continuous Infusions:  azithromycin 500 mg (08/15/22 0411)   meropenem (MERREM) IV 1 g (08/15/22 0909)   PRN Meds:.acetaminophen, albuterol, ondansetron (ZOFRAN) IV, mouth rinse  Current Outpatient Medications  Medication Instructions   albuterol (VENTOLIN HFA) 108 (90 Base) MCG/ACT inhaler 1-2 puffs, Inhalation, Every 6 hours PRN   amitriptyline (ELAVIL) 100 MG tablet TAKE 1/2 - 1 TABLET BY MOUTH AT BEDTIME FOR SLEEP AND FEET PAIN   apixaban (ELIQUIS) 5 mg, Oral, 2 times daily   Cholecalciferol (VITAMIN D-3) 25 MCG (1000 UT) CAPS 1 capsule, Oral, Daily   citalopram (CELEXA) 10 mg, Oral, Daily   cyanocobalamin (VITAMIN B12) 1000 MCG/ML injection INJECT 1 ML INTO  MUSCLE EVERY 30 DAYS   diltiazem (CARDIZEM) 120 mg, Oral, Daily   fenofibrate micronized (LOFIBRA) 134 MG capsule TAKE 1 CAPSULE BY MOUTH EVERY DAY IN THE MORNING BEFORE BREAKFAST   furosemide (LASIX) 40 mg, Oral, Every other day, 1 tab by mouth in the AM, and 1 tab by mouth in the PM as needed for persistent swelling or weight gain more than 3-5 lbs   losartan (COZAAR) 100 MG tablet  TAKE 1/2 (ONE HALF) TABLET BY MOUTH ONCE DAILY   meloxicam (MOBIC) 15 mg, Oral, Daily   metoprolol succinate (TOPROL-XL) 50 mg, Oral, Daily, TAKE WITH OR IMMEDIATELY FOLLOWING A MEAL.   nitrofurantoin (macrocrystal-monohydrate) (MACROBID) 100 mg, Oral, 2 times daily   pantoprazole (PROTONIX) 40 mg, Oral, Daily   Respiratory Therapy Supplies (FLUTTER) DEVI Use as directed   triamcinolone cream (KENALOG) 0.5 % 1 Application, Topical, 2 times daily PRN   UNABLE TO FIND STUDY - ASPIRE - apixaban 5 mg or placebo tablet (PI-Sethi) Per instructions TWICE A DAY (route: oral)<BR><BR>    Diet Orders (From admission, onward)     Start     Ordered   08/14/22 0252  Diet Heart Room service appropriate? Yes; Fluid consistency: Thin  Diet effective now       Question Answer Comment  Room service appropriate? Yes   Fluid consistency: Thin      08/14/22 0253            DVT prophylaxis:  apixaban (ELIQUIS) tablet 5 mg   Lab Results  Component Value Date   PLT 222 08/14/2022      Code Status: Full Code  Family Communication: daughter at bedside   Status is: Inpatient  Remains inpatient appropriate because: confusion, IV antibiotics   Level of care: Telemetry Cardiac  Consultants:  none  Objective: Vitals:   08/14/22 2215 08/15/22 0109 08/15/22 0426 08/15/22 0858  BP: (!) 141/90 (!) 139/93 (!) 128/93 (!) 153/95  Pulse: (!) 109 (!) 111 (!) 113 (!) 124  Resp: (!) 24 20 19   $ Temp: 98.4 F (36.9 C) 97.9 F (36.6 C) 98.3 F (36.8 C)   TempSrc: Oral Oral Oral   SpO2: 98% 97% 99%   Weight:      Height:        Intake/Output Summary (Last 24 hours) at 08/15/2022 1118 Last data filed at 08/14/2022 1800 Gross per 24 hour  Intake 732.62 ml  Output --  Net 732.62 ml   Wt Readings from Last 3 Encounters:  08/13/22 102.1 kg  08/12/22 102.2 kg  08/03/22 101.6 kg    Examination:  Constitutional: NAD Eyes: no scleral icterus ENMT: Mucous membranes are moist.  Neck: normal,  supple Respiratory: diffuse bibasilar rhonchi, no wheezing.  Cardiovascular: irregular, tachycardic   Abdomen: non distended, no tenderness. Bowel sounds positive.  Musculoskeletal: no clubbing / cyanosis.  Skin: no rashes Neurologic: non focal   Data Reviewed: I have independently reviewed following labs and imaging studies   CBC Recent Labs  Lab 08/13/22 1541 08/14/22 0727  WBC 13.1* 19.1*  HGB 11.3* 10.0*  HCT 34.3* 31.2*  PLT 191 222  MCV 100.6* 102.0*  MCH 33.1 32.7  MCHC 32.9 32.1  RDW 13.3 13.5    Recent Labs  Lab 08/13/22 1541 08/13/22 1619 08/13/22 1752 08/13/22 2238 08/14/22 0727 08/15/22 0806  NA 137  --   --   --  135 135  K 4.6  --   --   --  5.4* 4.8  CL 98  --   --   --  98 99  CO2 28  --   --   --  27 26  GLUCOSE 253*  --   --   --  141* 111*  BUN 32*  --   --   --  31* 35*  CREATININE 1.36*  --   --   --  1.15* 1.40*  CALCIUM 9.5  --   --   --  8.9 9.3  AST 36  --   --   --   --  33  ALT 19  --   --   --   --  23  ALKPHOS 48  --   --   --   --  49  BILITOT 0.4  --   --   --   --  0.5  ALBUMIN 3.0*  --   --   --   --  3.0*  MG  --   --   --   --   --  2.2  LATICACIDVEN  --   --  3.0* 1.2  --   --   BNP  --  164.2*  --   --   --   --     ------------------------------------------------------------------------------------------------------------------ No results for input(s): "CHOL", "HDL", "LDLCALC", "TRIG", "CHOLHDL", "LDLDIRECT" in the last 72 hours.  Lab Results  Component Value Date   HGBA1C 5.8 07/05/2022   ------------------------------------------------------------------------------------------------------------------ No results for input(s): "TSH", "T4TOTAL", "T3FREE", "THYROIDAB" in the last 72 hours.  Invalid input(s): "FREET3"  Cardiac Enzymes No results for input(s): "CKMB", "TROPONINI", "MYOGLOBIN" in the last 168 hours.  Invalid input(s):  "CK" ------------------------------------------------------------------------------------------------------------------    Component Value Date/Time   BNP 164.2 (H) 08/13/2022 1619    CBG: Recent Labs  Lab 08/13/22 1609  GLUCAP 242*    Recent Results (from the past 240 hour(s))  Resp panel by RT-PCR (RSV, Flu A&B, Covid) Anterior Nasal Swab     Status: None   Collection Time: 08/13/22  4:19 PM   Specimen: Anterior Nasal Swab  Result Value Ref Range Status   SARS Coronavirus 2 by RT PCR NEGATIVE NEGATIVE Final   Influenza A by PCR NEGATIVE NEGATIVE Final   Influenza B by PCR NEGATIVE NEGATIVE Final    Comment: (NOTE) The Xpert Xpress SARS-CoV-2/FLU/RSV plus assay is intended as an aid in the diagnosis of influenza from Nasopharyngeal swab specimens and should not be used as a sole basis for treatment. Nasal washings and aspirates are unacceptable for Xpert Xpress SARS-CoV-2/FLU/RSV testing.  Fact Sheet for Patients: EntrepreneurPulse.com.au  Fact Sheet for Healthcare Providers: IncredibleEmployment.be  This test is not yet approved or cleared by the [place] FDA and has been authorized for detection and/or diagnosis of SARS-CoV-2 by FDA under an Emergency Use Authorization (EUA). This EUA will remain in effect (meaning this test can be used) for the duration of the COVID-19 declaration under Section 564(b)(1) of the Act, 21 U.S.C. section 360bbb-3(b)(1), unless the authorization is terminated or revoked.     Resp Syncytial Virus by PCR NEGATIVE NEGATIVE Final    Comment: (NOTE) Fact Sheet for Patients: EntrepreneurPulse.com.au  Fact Sheet for Healthcare Providers: IncredibleEmployment.be  This test is not yet approved or cleared by the [place] FDA and has been authorized for detection and/or diagnosis of SARS-CoV-2 by FDA under an Emergency Use Authorization (EUA). This EUA will  remain in effect (meaning this test can be used) for the duration of the COVID-19 declaration under Section 564(b)(1) of the Act, 21 U.S.C. section 360bbb-3(b)(1), unless the  authorization is terminated or revoked.  Performed at [facility], [place] [address] [place], [place] [address]   Blood culture (routine x 2)     Status: None (Preliminary result)   Collection Time: 08/13/22  5:52 PM   Specimen: BLOOD  Result Value Ref Range Status   Specimen Description BLOOD LEFT ANTECUBITAL  Final   Special Requests   Final    BOTTLES DRAWN AEROBIC AND ANAEROBIC Blood Culture results may not be optimal due to an inadequate volume of blood received in culture bottles   Culture   Final    NO GROWTH 2 DAYS Performed at [facility], [place] [address], [place], [place] [address]    Report Status PENDING  Incomplete  Blood culture (routine x 2)     Status: None (Preliminary result)   Collection Time: 08/13/22  5:57 PM   Specimen: BLOOD  Result Value Ref Range Status   Specimen Description BLOOD RIGHT ANTECUBITAL  Final   Special Requests   Final    BOTTLES DRAWN AEROBIC AND ANAEROBIC Blood Culture results may not be optimal due to an inadequate volume of blood received in culture bottles   Culture   Final    NO GROWTH 2 DAYS Performed at [facility], [place] [address], [place],  [address]    Report Status PENDING  Incomplete     Radiology Studies: MR BRAIN WO CONTRAST  Result Date: 08/14/2022 CLINICAL DATA:  Altered mental status, cough EXAM: MRI HEAD WITHOUT CONTRAST TECHNIQUE: Multiplanar, multiecho pulse sequences of the brain and surrounding structures were obtained without intravenous contrast. COMPARISON:  07/25/2005 MRI head FINDINGS: Brain: No restricted diffusion to suggest acute or subacute infarct. No acute hemorrhage, mass, mass effect, or midline shift. No hydrocephalus or extra-axial collection. Normal craniocervical junction. No hemosiderin deposition to  suggest remote hemorrhage. Dilated perivascular spaces in the bilateral basal ganglia. Scattered remote lacunar infarcts in the bilateral lentiform nuclei. Scattered T2 hyperintense signal in the periventricular white matter and pons, likely the sequela of moderate chronic small vessel ischemic disease. Vascular: Normal arterial flow voids. Skull and upper cervical spine: Normal marrow signal. Sinuses/Orbits: Clear paranasal sinuses. Status post bilateral lens replacements. Other: Fluid throughout the bilateral mastoid air cells. IMPRESSION: No acute intracranial process. No evidence of acute or subacute infarct. Electronically Signed   By: Merilyn Baba M.D.   On: 08/14/2022 20:54     Marzetta Board, MD, PhD [facility]  Between 7 am - 7 pm I am available, please contact me via Amion (for emergencies) or Securechat (non urgent messages)  Between 7 pm - 7 am I am not available, please contact night coverage MD/APP via Amion

## 2022-08-15 NOTE — Progress Notes (Signed)
Inpatient Rehab Admissions Coordinator:   Per therapy recommendations,  patient was screened for CIR candidacy by Clemens Catholic, MS, CCC-SLP. At this time, Pt. Appears to be a a potential candidate for CIR. I will place   order for rehab consult per protocol for full assessment.    Clemens Catholic, [place], [place] Admissions Coordinator  [phone] ([place]) [phone] (office)

## 2022-08-16 DIAGNOSIS — I4891 Unspecified atrial fibrillation: Secondary | ICD-10-CM | POA: Diagnosis not present

## 2022-08-16 LAB — CBC
HCT: 34.7 % — ABNORMAL LOW (ref 36.0–46.0)
Hemoglobin: 11.1 g/dL — ABNORMAL LOW (ref 12.0–15.0)
MCH: 32.5 pg (ref 26.0–34.0)
MCHC: 32 g/dL (ref 30.0–36.0)
MCV: 101.5 fL — ABNORMAL HIGH (ref 80.0–100.0)
Platelets: 230 10*3/uL (ref 150–400)
RBC: 3.42 MIL/uL — ABNORMAL LOW (ref 3.87–5.11)
RDW: 13.4 % (ref 11.5–15.5)
WBC: 14.9 10*3/uL — ABNORMAL HIGH (ref 4.0–10.5)
nRBC: 0 % (ref 0.0–0.2)

## 2022-08-16 LAB — MAGNESIUM: Magnesium: 2.3 mg/dL (ref 1.7–2.4)

## 2022-08-16 LAB — BASIC METABOLIC PANEL
Anion gap: 11 (ref 5–15)
BUN: 38 mg/dL — ABNORMAL HIGH (ref 8–23)
CO2: 29 mmol/L (ref 22–32)
Calcium: 9.4 mg/dL (ref 8.9–10.3)
Chloride: 98 mmol/L (ref 98–111)
Creatinine, Ser: 1.28 mg/dL — ABNORMAL HIGH (ref 0.44–1.00)
GFR, Estimated: 42 mL/min — ABNORMAL LOW (ref >60)
Glucose, Bld: 115 mg/dL — ABNORMAL HIGH (ref 70–99)
Potassium: 5.2 mmol/L — ABNORMAL HIGH (ref 3.5–5.1)
Sodium: 138 mmol/L (ref 135–145)

## 2022-08-16 MED ORDER — METOPROLOL SUCCINATE ER 50 MG PO TB24
75.0000 mg | ORAL_TABLET | Freq: Every day | ORAL | Status: DC
  Administered 2022-08-16 – 2022-08-18 (×3): 75 mg via ORAL
  Filled 2022-08-16 (×4): qty 1

## 2022-08-16 MED ORDER — DILTIAZEM HCL-DEXTROSE 125-5 MG/125ML-% IV SOLN (PREMIX)
5.0000 mg/h | INTRAVENOUS | Status: DC
  Administered 2022-08-16 – 2022-08-18 (×3): 5 mg/h via INTRAVENOUS
  Filled 2022-08-16 (×3): qty 125

## 2022-08-16 MED ORDER — SODIUM ZIRCONIUM CYCLOSILICATE 5 G PO PACK
5.0000 g | PACK | Freq: Once | ORAL | Status: AC
  Administered 2022-08-16: 5 g via ORAL
  Filled 2022-08-16: qty 1

## 2022-08-16 NOTE — Telephone Encounter (Signed)
Left message for patient to call back to schedule.  °

## 2022-08-16 NOTE — Progress Notes (Signed)
Inpatient Rehab Admissions Coordinator:   Consult received for AIR. Would like to give patient a day or two to see how she progresses with therapy to see if she would be a good candidate. Will continue to follow.   Rehab Admissons Coordinator [place], [place], [place] [phone]

## 2022-08-16 NOTE — Progress Notes (Addendum)
PROGRESS NOTE  Kathy [name] [id number] DOB: [birth year] DOA: 08/13/2022 PCP: Biagio Borg, MD   LOS: 2 days   Brief Narrative / Interim history:  82 year old female with history of PAF on Eliquis, CKD 3A with baseline creatinine 1.2-1.4, ILD with chronic hypoxic respiratory failure on 2-3 L at home, recurrent UTIs who comes to the hospital with altered mental status.  Daughter tells me that she initially started to be confused around last Thanksgiving, was diagnosed with a UTI and her mental status cleared with treatment.  Had recurrent UTI in January which was treated, microbiology showed ESBL E. coli.  She has been having intermittent confusion since, more so in the last few days, and she was brought to the ER.  Daughter also reports worsening shortness of breath and increased cough in the last few days.  She has chronic knee problems and had steroid injection in to the knees couple days ago.  In the ED she was found to be in A-fib with RVR and started on Cardizem drip   Subjective / 24h Interval events: Seems more alert this morning.  Daughter is at bedside.  Assesement and Plan: Principal Problem:   Atrial fibrillation with RVR ([place]) Active Problems:   Community acquired pneumonia   UTI due to extended-spectrum beta lactamase (ESBL) producing Escherichia coli   Essential hypertension   ILD (interstitial lung disease) (HCC)   CKD (chronic kidney disease) stage 3, GFR 30-59 ml/min (HCC)   CAP (community acquired pneumonia)   Principal problem Acute metabolic encephalopathy, likely due to community-acquired pneumonia -patient's imaging shows suspicious for right upper lobe pneumonia.  She is symptomatic with cough, congestion, and has a significant leukocytosis.  Has been placed on broad-spectrum antibiotics, continue for now.  Monitor blood cultures -Given her intermittent confusion even outside periods when she didn't have a clear cut infection, an MRI of the brain was obtained and it  was fairly unremarkable without acute or subacute infarct   Active problems A-fib with RVR -off cardizem infusion, on metoprolol and po diltiazem.  Continue to up titrate Cardizem and metoprolol.  Occasionally goes into the 140s.  Consulted cardio, appreciate input   Recent ESBL E. coli UTI -urinalysis now clean.  Hyperkalemia-in the setting of high CKD, Lokelma x 1 today  Chronic kidney disease stage IIIa-creatinine looks at baseline   ILD, chronic hypoxic respiratory failure -respiratory status appears close to baseline   Essential hypertension-resume metoprolol.  Hold ARB  Scheduled Meds:  amitriptyline  50 mg Oral QHS   apixaban  5 mg Oral BID   citalopram  10 mg Oral Daily   diltiazem  90 mg Oral Q12H   metoprolol succinate  75 mg Oral Daily   pantoprazole  40 mg Oral Daily   Continuous Infusions:  azithromycin 500 mg (08/16/22 0451)   meropenem (MERREM) IV 1 g (08/15/22 2227)   PRN Meds:.acetaminophen, albuterol, ondansetron (ZOFRAN) IV, mouth rinse  Current Outpatient Medications  Medication Instructions   albuterol (VENTOLIN HFA) 108 (90 Base) MCG/ACT inhaler 1-2 puffs, Inhalation, Every 6 hours PRN   amitriptyline (ELAVIL) 100 MG tablet TAKE 1/2 - 1 TABLET BY MOUTH AT BEDTIME FOR SLEEP AND FEET PAIN   apixaban (ELIQUIS) 5 mg, Oral, 2 times daily   Cholecalciferol (VITAMIN D-3) 25 MCG (1000 UT) CAPS 1 capsule, Oral, Daily   citalopram (CELEXA) 10 mg, Oral, Daily   cyanocobalamin (VITAMIN B12) 1000 MCG/ML injection INJECT 1 ML INTO MUSCLE EVERY 30 DAYS   diltiazem (CARDIZEM) 120 mg, Oral,  Daily   fenofibrate micronized (LOFIBRA) 134 MG capsule TAKE 1 CAPSULE BY MOUTH EVERY DAY IN THE MORNING BEFORE BREAKFAST   furosemide (LASIX) 40 mg, Oral, Every other day, 1 tab by mouth in the AM, and 1 tab by mouth in the PM as needed for persistent swelling or weight gain more than 3-5 lbs   losartan (COZAAR) 100 MG tablet TAKE 1/2 (ONE HALF) TABLET BY MOUTH ONCE DAILY   meloxicam  (MOBIC) 15 mg, Oral, Daily   metoprolol succinate (TOPROL-XL) 50 mg, Oral, Daily, TAKE WITH OR IMMEDIATELY FOLLOWING A MEAL.   nitrofurantoin (macrocrystal-monohydrate) (MACROBID) 100 mg, Oral, 2 times daily   pantoprazole (PROTONIX) 40 mg, Oral, Daily   Respiratory Therapy Supplies (FLUTTER) DEVI Use as directed   triamcinolone cream (KENALOG) 0.5 % 1 Application, Topical, 2 times daily PRN   UNABLE TO FIND STUDY - ASPIRE - apixaban 5 mg or placebo tablet (PI-Sethi) Per instructions TWICE A DAY (route: oral)<BR><BR>    Diet Orders (From admission, onward)     Start     Ordered   08/14/22 0252  Diet Heart Room service appropriate? Yes; Fluid consistency: Thin  Diet effective now       Question Answer Comment  Room service appropriate? Yes   Fluid consistency: Thin      08/14/22 0253            DVT prophylaxis:  apixaban (ELIQUIS) tablet 5 mg   Lab Results  Component Value Date   PLT 230 08/16/2022      Code Status: Full Code  Family Communication: daughter at bedside   Status is: Inpatient  Remains inpatient appropriate because: A-fib with RVR   Level of care: Telemetry Cardiac  Consultants:  none  Objective: Vitals:   08/16/22 0042 08/16/22 0300 08/16/22 0605 08/16/22 0815  BP: 131/69  115/81 (!) 140/86  Pulse: (!) 120  (!) 105 (!) 106  Resp: 19  14 20  $ Temp: 98 F (36.7 C)  97.7 F (36.5 C) 99.1 F (37.3 C)  TempSrc: Oral  Oral Axillary  SpO2: 96% (!) 76% 99% 97%  Weight:      Height:        Intake/Output Summary (Last 24 hours) at 08/16/2022 1001 Last data filed at 08/16/2022 0606 Gross per 24 hour  Intake --  Output 1400 ml  Net -1400 ml    Wt Readings from Last 3 Encounters:  08/13/22 102.1 kg  08/12/22 102.2 kg  08/03/22 101.6 kg    Examination:  Constitutional: NAD Eyes: lids and conjunctivae normal, no scleral icterus ENMT: mmm Neck: normal, supple Respiratory: Velcro type sounds at the bases, no wheezing Cardiovascular:  Irregularly irregular, no edema Abdomen: soft, no distention, no tenderness. Bowel sounds positive.  Skin: no rashes Neurologic: no focal deficits, equal strength   Data Reviewed: I have independently reviewed following labs and imaging studies   CBC Recent Labs  Lab 08/13/22 1541 08/14/22 0727 08/15/22 1313 08/16/22 0251  WBC 13.1* 19.1* 18.5* 14.9*  HGB 11.3* 10.0* 11.2* 11.1*  HCT 34.3* 31.2* 35.3* 34.7*  PLT 191 222 179 230  MCV 100.6* 102.0* 101.1* 101.5*  MCH 33.1 32.7 32.1 32.5  MCHC 32.9 32.1 31.7 32.0  RDW 13.3 13.5 13.7 13.4     Recent Labs  Lab 08/13/22 1541 08/13/22 1619 08/13/22 1752 08/13/22 2238 08/14/22 0727 08/15/22 0806 08/16/22 0251  NA 137  --   --   --  135 135 138  K 4.6  --   --   --  5.4* 4.8 5.2*  CL 98  --   --   --  98 99 98  CO2 28  --   --   --  27 26 29  $ GLUCOSE 253*  --   --   --  141* 111* 115*  BUN 32*  --   --   --  31* 35* 38*  CREATININE 1.36*  --   --   --  1.15* 1.40* 1.28*  CALCIUM 9.5  --   --   --  8.9 9.3 9.4  AST 36  --   --   --   --  33  --   ALT 19  --   --   --   --  23  --   ALKPHOS 48  --   --   --   --  49  --   BILITOT 0.4  --   --   --   --  0.5  --   ALBUMIN 3.0*  --   --   --   --  3.0*  --   MG  --   --   --   --   --  2.2 2.3  LATICACIDVEN  --   --  3.0* 1.2  --   --   --   BNP  --  164.2*  --   --   --   --   --      ------------------------------------------------------------------------------------------------------------------ No results for input(s): "CHOL", "HDL", "LDLCALC", "TRIG", "CHOLHDL", "LDLDIRECT" in the last 72 hours.  Lab Results  Component Value Date   HGBA1C 5.8 07/05/2022   ------------------------------------------------------------------------------------------------------------------ No results for input(s): "TSH", "T4TOTAL", "T3FREE", "THYROIDAB" in the last 72 hours.  Invalid input(s): "FREET3"  Cardiac Enzymes No results for input(s): "CKMB", "TROPONINI", "MYOGLOBIN" in  the last 168 hours.  Invalid input(s): "CK" ------------------------------------------------------------------------------------------------------------------    Component Value Date/Time   BNP 164.2 (H) 08/13/2022 1619    CBG: Recent Labs  Lab 08/13/22 1609  GLUCAP 242*     Recent Results (from the past 240 hour(s))  Resp panel by RT-PCR (RSV, Flu A&B, Covid) Anterior Nasal Swab     Status: None   Collection Time: 08/13/22  4:19 PM   Specimen: Anterior Nasal Swab  Result Value Ref Range Status   SARS Coronavirus 2 by RT PCR NEGATIVE NEGATIVE Final   Influenza A by PCR NEGATIVE NEGATIVE Final   Influenza B by PCR NEGATIVE NEGATIVE Final    Comment: (NOTE) The Xpert Xpress SARS-CoV-2/FLU/RSV plus assay is intended as an aid in the diagnosis of influenza from Nasopharyngeal swab specimens and should not be used as a sole basis for treatment. Nasal washings and aspirates are unacceptable for Xpert Xpress SARS-CoV-2/FLU/RSV testing.  Fact Sheet for Patients: EntrepreneurPulse.com.au  Fact Sheet for Healthcare Providers: IncredibleEmployment.be  This test is not yet approved or cleared by the [place] FDA and has been authorized for detection and/or diagnosis of SARS-CoV-2 by FDA under an Emergency Use Authorization (EUA). This EUA will remain in effect (meaning this test can be used) for the duration of the COVID-19 declaration under Section 564(b)(1) of the Act, 21 U.S.C. section 360bbb-3(b)(1), unless the authorization is terminated or revoked.     Resp Syncytial Virus by PCR NEGATIVE NEGATIVE Final    Comment: (NOTE) Fact Sheet for Patients: EntrepreneurPulse.com.au  Fact Sheet for Healthcare Providers: IncredibleEmployment.be  This test is not yet approved or cleared by the [place] and has been authorized for  detection and/or diagnosis of SARS-CoV-2 by FDA under an Emergency  Use Authorization (EUA). This EUA will remain in effect (meaning this test can be used) for the duration of the COVID-19 declaration under Section 564(b)(1) of the Act, 21 U.S.C. section 360bbb-3(b)(1), unless the authorization is terminated or revoked.  Performed at [facility], [place] [address], [place], [place] [address]   Blood culture (routine x 2)     Status: None (Preliminary result)   Collection Time: 08/13/22  5:52 PM   Specimen: BLOOD  Result Value Ref Range Status   Specimen Description BLOOD LEFT ANTECUBITAL  Final   Special Requests   Final    BOTTLES DRAWN AEROBIC AND ANAEROBIC Blood Culture results may not be optimal due to an inadequate volume of blood received in culture bottles   Culture   Final    NO GROWTH 3 DAYS Performed at [facility], [place] [address] [place],  [address]    Report Status PENDING  Incomplete  Blood culture (routine x 2)     Status: None (Preliminary result)   Collection Time: 08/13/22  5:57 PM   Specimen: BLOOD  Result Value Ref Range Status   Specimen Description BLOOD RIGHT ANTECUBITAL  Final   Special Requests   Final    BOTTLES DRAWN AEROBIC AND ANAEROBIC Blood Culture results may not be optimal due to an inadequate volume of blood received in culture bottles   Culture   Final    NO GROWTH 3 DAYS Performed at [facility], [place] [address], [place],  [address]    Report Status PENDING  Incomplete     Radiology Studies: No results found.   Marzetta Board, MD, PhD [facility]  Between 7 am - 7 pm I am available, please contact me via Amion (for emergencies) or Securechat (non urgent messages)  Between 7 pm - 7 am I am not available, please contact night coverage MD/APP via Amion

## 2022-08-16 NOTE — Telephone Encounter (Signed)
Primary Medicare Thank you for submitting your patient enrollment form to [employer]. We are pleased to inform you that Medicare eligible patients with osteoarthritis of the knee are covered for ZILRETTA (triamcinolone acetonide extended-release injectable suspension) under the medical benefit (Part B). Patient has a Medicare Part B plan with an effective date of 05/29/2007. Plan follows all Medicare guidelines. Patient responsibility for [phone] Kathy [name]) and CPT code 20611 will be 20% with the remaining covered at 80% by the payer at the contracted rate. This year's Medicare deductible is $240 and patient has met $0. Deductible must be met before coverage applies. Out of pocket does not apply to these services.   Secondary [employer] Medicare Supp Patient has a Medicare Supplement Plan F with an effective date of 06/29/2015. This supplemental plan covers the 20% Medicare coinsurance and Part B deductible. Plan follows all Medicare guidelines. No prior authorization or referrals needed. As long as you have verified that your patient is active with Medicare (red, white and blue card holders) and their Medicare Supplement plan, you can be confident that Kathy [name] is covered and inject your patient the day of their visit. Patient has a calendar year policy starting from June 28, 2022 to June 28, 2023.

## 2022-08-16 NOTE — Consult Note (Signed)
CARDIOLOGY CONSULT NOTE  Patient ID: Kathy [name] MRN: [id number] DOB/AGE: 01/01/1941 82 y.o.  Admit date: 08/13/2022 Referring Physician: [facility] Reason for Consultation:  Afib  HPI:   82 y.o. y.o. Caucasian female with hypertension, hyperlipidemia, interstitial lung disease, persistent Afib, pulmonary hypertension, admitted with UTI, CAP, altered mental status.  Cardiology consulted for management of A-fib.     Patient's daughter present in the room.  Patient has functional and cognitive decline since Thanksgiving 2023, which was discharged with UTI.  There is ongoing open management going on per primary team for metabolic encephalopathy, likely due to CAP at this time. 82  Cardiology was consulted for management of A-fib.  Patient denies any palpitations.  She reportedly does have chest pain from time to time that is retrosternal, sharp, radiates to right arm, present at both rest and exertion.  [place], she does not much of any physical activity at baseline.  Currently, she is chest pain-free.  She is currently on metoprolol succinate 75 mg daily, diltiazem 90 mg twice daily, with A-fib rate stays around 140s.  Past Medical History:  Diagnosis Date   Arthritis    fingers   CKD (chronic kidney disease) stage 3, GFR 30-59 ml/min (HCC) 11/30/2017   Depression    Dizziness    in AM, getting out of bed   Dysrhythmia    A fib   Endometrial ca ([place]) 11/30/2017   S/p surgury 1990's   GERD (gastroesophageal reflux disease) 11/30/2017   HLD (hyperlipidemia) 11/30/2017   Hypercholesteremia    Hypertension    Interstitial lung disease ([place])    Neuropathy    bilateral feet   Pneumonia 12/14/2021   Shortness of breath dyspnea    Sleep apnea    has CPAP, doesn't use   Umbilical hernia      Past Surgical History:  Procedure Laterality Date   ABDOMINAL HYSTERECTOMY     BROW LIFT Bilateral 07/15/2015   Procedure: BLEPHAROPLASTY;  Surgeon: Karle Starch, MD;  Location: [place];  Service: Ophthalmology;  Laterality: Bilateral;   CATARACT EXTRACTION W/PHACO Right 12/31/2021   Procedure: CATARACT EXTRACTION PHACO AND INTRAOCULAR LENS PLACEMENT ([place]) RIGHT;  Surgeon: Norvel Richards, MD;  Location: [place];  Service: Ophthalmology;  Laterality: Right;  17.11 1:46.1   CHOLECYSTECTOMY     HAMMER TOE SURGERY     HERNIA REPAIR     KNEE ARTHROSCOPY Bilateral    PTOSIS REPAIR Bilateral 07/15/2015   Procedure: PTOSIS REPAIR;  Surgeon: Karle Starch, MD;  Location: [place];  Service: Ophthalmology;  Laterality: Bilateral;  CPAP   TONSILLECTOMY        Family History  Problem Relation Age of Onset   Congestive Heart Failure Mother    Stroke Father    Parkinson's disease Father    Diabetes Son      Social History: Social History   Socioeconomic History   Marital status: Married    Spouse name: Not on file   Number of children: Not on file   Years of education: Not on file   Highest education level: Not on file  Occupational History   Occupation: Retired  Tobacco Use   Smoking status: Former    Packs/day: 1.00    Years: 10.00    Total pack years: 10.00    Types: Cigarettes    Quit date: 06/28/1986    Years since quitting: 36.1   Smokeless tobacco: Never   Tobacco comments:    quit 40+ yrs  ago, 1 PPD for a few years  Vaping Use   Vaping Use: Never used  Substance and Sexual Activity   Alcohol use: No   Drug use: No   Sexual activity: Not on file  Other Topics Concern   Not on file  Social History Narrative   Not on file   Social Determinants of Health   Financial Resource Strain: Low Risk  (02/09/2022)   Overall Financial Resource Strain (CARDIA)    Difficulty of Paying Living Expenses: Not hard at all  Food Insecurity: No Food Insecurity (08/14/2022)   Hunger Vital Sign    Worried About Running Out of Food in the Last Year: Never true    Ran Out of Food in the Last Year: Never true  Transportation Needs: No  Transportation Needs (08/14/2022)   PRAPARE - Hydrologist (Medical): No    Lack of Transportation (Non-Medical): No  Physical Activity: Inactive (02/09/2022)   Exercise Vital Sign    Days of Exercise per Week: 0 days    Minutes of Exercise per Session: 0 min  Stress: No Stress Concern Present (02/09/2022)   [place]    Feeling of Stress : Not at all  Social Connections: Moderately Isolated (02/09/2022)   Social Connection and Isolation Panel [NHANES]    Frequency of Communication with Friends and Family: More than three times a week    Frequency of Social Gatherings with Friends and Family: More than three times a week    Attends Religious Services: Never    Marine scientist or Organizations: No    Attends Archivist Meetings: Never    Marital Status: Married  Human resources officer Violence: Not At Risk (08/14/2022)   Humiliation, Afraid, Rape, and Kick questionnaire    Fear of Current or Ex-Partner: No    Emotionally Abused: No    Physically Abused: No    Sexually Abused: No     Medications Prior to Admission  Medication Sig Dispense Refill Last Dose   albuterol (VENTOLIN HFA) 108 (90 Base) MCG/ACT inhaler Inhale 1-2 puffs into the lungs every 6 (six) hours as needed for wheezing or shortness of breath. 1 each 11 UNKNOWN   amitriptyline (ELAVIL) 100 MG tablet TAKE 1/2 - 1 TABLET BY MOUTH AT BEDTIME FOR SLEEP AND FEET PAIN (Patient taking differently: Take 50-100 mg by mouth at bedtime.) 90 tablet 1 UNKNOWN   apixaban (ELIQUIS) 5 MG TABS tablet Take 1 tablet (5 mg total) by mouth 2 (two) times daily. 180 tablet 3 08/13/2022 at 1000   Cholecalciferol (VITAMIN D-3) 25 MCG (1000 UT) CAPS Take 1 capsule by mouth daily.   UNKNOWN   citalopram (CELEXA) 10 MG tablet Take 1 tablet (10 mg total) by mouth daily. 90 tablet 3 08/13/2022 at am   cyanocobalamin (VITAMIN B12) 1000 MCG/ML injection  INJECT 1 ML INTO MUSCLE EVERY 30 DAYS 3 mL 1 UNKNOW   diltiazem (CARDIZEM) 120 MG tablet Take 120 mg by mouth daily.   UNKNOWN   fenofibrate micronized (LOFIBRA) 134 MG capsule TAKE 1 CAPSULE BY MOUTH EVERY DAY IN THE MORNING BEFORE BREAKFAST (Patient taking differently: Take 134 mg by mouth daily before breakfast.) 90 capsule 3 08/13/2022 at am   furosemide (LASIX) 40 MG tablet Take 1 tablet (40 mg total) by mouth every other day. 1 tab by mouth in the AM, and 1 tab by mouth in the PM as needed for persistent swelling  or weight gain more than 3-5 lbs 60 tablet 11 08/13/2022 at am   losartan (COZAAR) 100 MG tablet TAKE 1/2 (ONE HALF) TABLET BY MOUTH ONCE DAILY (Patient taking differently: Take 50 mg by mouth daily.) 45 tablet 3 08/13/2022 at am   meloxicam (MOBIC) 15 MG tablet Take 1 tablet (15 mg total) by mouth daily. 30 tablet 2 08/13/2022   metoprolol succinate (TOPROL-XL) 50 MG 24 hr tablet TAKE 1 TABLET BY MOUTH DAILY. TAKE WITH OR IMMEDIATELY FOLLOWING A MEAL. 90 tablet 3 08/13/2022 at 1000   nitrofurantoin, macrocrystal-monohydrate, (MACROBID) 100 MG capsule Take 1 capsule (100 mg total) by mouth 2 (two) times daily. 20 capsule 0 08/13/2022 at am   pantoprazole (PROTONIX) 40 MG tablet TAKE 1 TABLET BY MOUTH EVERY DAY 90 tablet 3 08/13/2022 at am   triamcinolone cream (KENALOG) 0.5 % Apply 1 Application topically 2 (two) times daily as needed. (Patient taking differently: Apply 1 Application topically 2 (two) times daily as needed (for itching).) 454 g 1 Past Week   Respiratory Therapy Supplies (FLUTTER) DEVI Use as directed 1 each 0    UNABLE TO FIND STUDY - ASPIRE - apixaban 5 mg or placebo tablet (PI-Sethi) Per instructions TWICE A DAY (route: oral)   UNKNOWN    Review of Systems  Cardiovascular:  Positive for chest pain (Currently absent). Negative for dyspnea on exertion, leg swelling, palpitations and syncope.      Physical Exam: Physical Exam Vitals and nursing note reviewed.   Constitutional:      General: She is not in acute distress.    Appearance: She is obese.  Neck:     Vascular: No JVD.  Cardiovascular:     Rate and Rhythm: Normal rate and regular rhythm.     Heart sounds: Normal heart sounds. No murmur heard. Pulmonary:     Effort: Pulmonary effort is normal.     Breath sounds: Normal breath sounds. No wheezing or rales.  Musculoskeletal:     Right lower leg: No edema.     Left lower leg: No edema.        Imaging/tests reviewed and independently interpreted: Lab Results: CXR 08/13/2022: Rotated AP portable examination. Cardiomegaly with diffuse bilateral interstitial pulmonary opacity, likely edema. No focal airspace opacity.  Cardiac Studies:  Telemetry 08/16/2022: Afib w/RVR 120-140 bpm  EKG 08/13/2022: Atrial fibrillation Ventricular premature complex Borderline repolarization abnormality  Echocardiogram 2021:  1. Left ventricular ejection fraction, by estimation, is 45 to 50%. The  left ventricle has mildly decreased function. The left ventricle  demonstrates global hypokinesis. Left ventricular diastolic parameters are  indeterminate.   2. Right ventricular systolic function is normal. The right ventricular  size is normal. There is mildly elevated pulmonary artery systolic  pressure. The estimated right ventricular systolic pressure is 99991111 mmHg.   3. Left atrial size was mildly dilated.   4. The mitral valve is normal in structure. No evidence of mitral valve  regurgitation. No evidence of mitral stenosis.   5. The aortic valve is tricuspid. Aortic valve regurgitation is not  visualized. Mild aortic valve sclerosis is present, with no evidence of  aortic valve stenosis.   6. The inferior vena cava is normal in size with <50% respiratory  variability, suggesting right atrial pressure of 8 mmHg.   7. The patient appeared to be in atrial fibrillation with mild RVR.     Assessment & Recommendations:  82 y.o. y.o. Caucasian  female with hypertension, hyperlipidemia, interstitial lung disease, persistent Afib, pulmonary  hypertension, admitted with UTI, CAP, altered mental status.  Cardiology consulted for management of A-fib.     A-fib with RVR: Persistent A-fib. Rate poorly controlled at this time metoprolol succinate 75 mg daily, diltiazem SR 90 mg twice daily. Will start Cardizem drip to control ventricular rate around 100 bpm. If rate does not get adequately controlled over the next 24-48 hours, will tentatively plan for TEE/cardioversion on Wednesday, 08/18/2021. There is question regarding her compliance Eliquis at baseline. She continue Eliquis 5 mg twice daily.  Chest pain: Known coronary atherosclerosis noted on prior imaging.  No ACS.  Her chest pain is likely from demand ischemia from time to time.  Given her advanced age, other ongoing noncardiac medical issues, recommend conservative and management at this time.   Discussed interpretation of tests and management recommendations with the primary team     Nigel Mormon, MD Pager: [phone] Office: [phone]

## 2022-08-16 NOTE — Progress Notes (Signed)
Left knee x-ray shows severe arthritis

## 2022-08-16 NOTE — Progress Notes (Signed)
Right knee x-ray shows severe arthritis

## 2022-08-16 NOTE — Telephone Encounter (Signed)
Primary: Medicare As per Medicare guidelines, a standard course of treatment for Gelsyn-3 [id number] is 3 injections per knee in a 82-monthperiod. Additional injections may not be considered reasonable and necessary and may deny. For services to be approved there must be radiological evidence to support the diagnosis of osteoarthritis OF THE KNEE ONLY; and patient must have tried and failed with conservative forms of treatment. Plan may request Medical Notes upon receipt of claim. Follows all Medicare Guidelines. Treatment of joints other than the knee are pursuant to your local MAC guidelines. Please refer to your local CMS website. Reference # IVR 08/13/2022  Secondary: CChristella ScheuermannMedicare Supp Active since 08/27/2015; Medicare supplement plan F. This plan covers the 20% Medicare coinsurance and Part B deductible. Plan follows all Medicare guidelines. No pre-cert, medical notes or referrals needed. REF:Florentina JennyATO:[id number]

## 2022-08-16 NOTE — Telephone Encounter (Signed)
Will proceed with Gelsyn-3.

## 2022-08-16 NOTE — Evaluation (Signed)
Occupational Therapy Evaluation Patient Details Name: Kathy [name] MRN: [id number] DOB: 1940-09-24 Today's Date: 08/16/2022   History of Present Illness 82 year old female admitted with AMS, SOB, Afib and increased cough.  Work up reveals community acquired PNA.  PMH significant for history of PAF, ILD with chronic hypoxic respiratory failure on 2-3 L at home, recurrent UTIs.  Daughter reports that she initially started to be confused around last Thanksgiving,  She has chronic knee problems and had steroid injection in to the knees couple days ago.   Clinical Impression   Pt presents with decline in function and safety with ADLs and ADL mobility with impaired strength, balance, endurance and cognition (confusion). PTA pt lived at home with family and reports that she was Ind with ADLs/selfcare and used a Marketing executive for mobility. Pt currently required mod A for bed mobility to sit EOB, min a for standing/transfers, mod - max A for LB ADLs and mod A for toileting. Pt would benefit from skilled OT services to address impairments to maximize level of function and safety     Recommendations for follow up therapy are one component of a multi-disciplinary discharge planning process, led by the attending physician.  Recommendations may be updated based on patient status, additional functional criteria and insurance authorization.   Follow Up Recommendations  Acute inpatient rehab (3hours/day)     Assistance Recommended at Discharge Frequent or constant Supervision/Assistance  Patient can return home with the following A lot of help with bathing/dressing/bathroom;A little help with walking and/or transfers;Other (comment) (a lot of help with bed mobility)    Functional Status Assessment     Equipment Recommendations  Other (comment) (TBD at next venue of care)    Recommendations for Other Services       Precautions / Restrictions Precautions Precautions: Fall Restrictions Weight Bearing  Restrictions: No      Mobility Bed Mobility Overal bed mobility: Needs Assistance Bed Mobility: Supine to Sit, Sit to Supine     Supine to sit: Mod assist Sit to supine: Mod assist, +2 for physical assistance   General bed mobility comments: mod A with LEs off EOB, max A with LE back onto bed. Pt unable to scoot self up to [facility], required total A,+2    Transfers Overall transfer level: Needs assistance Equipment used: Rolling walker (2 wheels) Transfers: Sit to/from Stand, Bed to chair/wheelchair/BSC Sit to Stand: Min assist     Step pivot transfers: Min assist     General transfer comment: verbal and tactile cues for correct hand placement using RW      Balance Overall balance assessment: Needs assistance Sitting-balance support: No upper extremity supported, Feet supported Sitting balance-Leahy Scale: Fair     Standing balance support: Bilateral upper extremity supported, During functional activity, Reliant on assistive device for balance Standing balance-Leahy Scale: Poor                             ADL either performed or assessed with clinical judgement   ADL Overall ADL's : Needs assistance/impaired Eating/Feeding: Independent;Sitting   Grooming: Wash/dry hands;Wash/dry face;Oral care;Brushing hair;Set up;Supervision/safety;Sitting;Cueing for sequencing   Upper Body Bathing: Min guard;Sitting   Lower Body Bathing: Maximal assistance   Upper Body Dressing : Min guard;Sitting   Lower Body Dressing: Total assistance   Toilet Transfer: Minimal assistance;Cueing for safety;Cueing for sequencing;Rolling walker (2 wheels)   Toileting- Clothing Manipulation and Hygiene: Moderate assistance       Functional mobility during  ADLs: Minimal assistance;Cueing for sequencing;Cueing for safety;Rolling walker (2 wheels)       Vision Baseline Vision/History: 1 Wears glasses Ability to See in Adequate Light: 0 Adequate Patient Visual Report: No change  from baseline       Perception     Praxis      Pertinent Vitals/Pain Pain Assessment Pain Assessment: No/denies pain     Hand Dominance Right   Extremity/Trunk Assessment Upper Extremity Assessment Upper Extremity Assessment: Generalized weakness   Lower Extremity Assessment Lower Extremity Assessment: Defer to PT evaluation       Communication Communication Communication: No difficulties   Cognition Arousal/Alertness: Awake/alert Behavior During Therapy: WFL for tasks assessed/performed Overall Cognitive Status: Impaired/Different from baseline Area of Impairment: Following commands, Memory, Safety/judgement, Awareness, Problem solving                       Following Commands: Follows one step commands with increased time     Problem Solving: Difficulty sequencing, Requires verbal cues, Requires tactile cues       General Comments       Exercises     Shoulder Instructions      Home Living Family/patient expects to be discharged to:: Private residence Living Arrangements: Spouse/significant other;Children Available Help at Discharge: Family;Available 24 hours/day Type of Home: House Home Access: Level entry     Home Layout: Two level;Able to live on main level with bedroom/bathroom     Bathroom Shower/Tub: Walk-in shower;Tub/shower unit   Bathroom Toilet: Handicapped height     Home Equipment: Shower seat - built in;Rollator (4 wheels);Wheelchair - manual;Cane - single point   Additional Comments: husband cannot physically lift patient      Prior Functioning/Environment Prior Level of Function : Independent/Modified Independent             Mobility Comments: Used rollator for [employer] tasks ADLs Comments: pt reports that she was Ind - Mod I with ADLs/selfcare        OT Problem List: Decreased cognition;Decreased strength;Decreased activity tolerance;Impaired balance (sitting and/or standing);Decreased safety awareness;Decreased  coordination;Obesity      OT Treatment/Interventions: Therapeutic exercise;DME and/or AE instruction;Balance training;Self-care/ADL training;Neuromuscular education;Therapeutic activities;Patient/family education    OT Goals(Current goals can be found in the care plan section) Acute Rehab OT Goals Patient Stated Goal: go home OT Goal Formulation: With patient Time For Goal Achievement: 08/30/22 Potential to Achieve Goals: Good ADL Goals Pt Will Perform Grooming: with min guard assist;with supervision;standing Pt Will Perform Upper Body Bathing: with supervision;with set-up;sitting Pt Will Perform Lower Body Bathing: with mod assist;sitting/lateral leans;sit to/from stand Pt Will Perform Upper Body Dressing: with supervision;with set-up;sitting Pt Will Transfer to Toilet: with min guard assist;ambulating;regular height toilet;grab bars;bedside commode Pt Will Perform Toileting - Clothing Manipulation and hygiene: with min assist;sit to/from stand  OT Frequency: Min 2X/week    Co-evaluation              AM-PAC OT "6 Clicks" Daily Activity     Outcome Measure   Help from another person taking care of personal grooming?: A Little Help from another person toileting, which includes using toliet, bedpan, or urinal?: A Lot Help from another person bathing (including washing, rinsing, drying)?: A Lot Help from another person to put on and taking off regular upper body clothing?: A Little Help from another person to put on and taking off regular lower body clothing?: Total 6 Click Score: 11   End of Session Equipment Utilized During Treatment: Gait belt;Rolling  walker (2 wheels);Other (comment) [facility]) Nurse Communication: Mobility status  Activity Tolerance: Patient tolerated treatment well Patient left: in bed;with call bell/phone within reach;with nursing/sitter in room  OT Visit Diagnosis: Unsteadiness on feet (R26.81);Other abnormalities of gait and mobility (R26.89);Muscle  weakness (generalized) (M62.81);Other symptoms and signs involving cognitive function                Time: [id number] OT Time Calculation (min): 27 min Charges:  OT General Charges $OT Visit: 1 Visit OT Evaluation $OT Eval Moderate Complexity: 1 Mod OT Treatments $Therapeutic Activity: 8-22 mins    Britt Bottom 08/16/2022, 1:18 PM

## 2022-08-16 NOTE — Telephone Encounter (Signed)
Pt is ready for scheduling on or after 08/16/22  Primary: Medicare Gelsyn-3 co-insurance: 20% U/S guidance (20611) co-insurance: 20%  Secondary: [place] Medicare Spp Gelsyn-3 co-insurance: Covers Medicare Part B co-insurance U/S guidance (20611) co-insurance: Covers Medicare Part B co-insurance  Deductible:  Covered by secondary  Prior Auth: NOT required PA# Valid:     ** This summary of benefits is an estimation of the patient's out-of-pocket cost. Exact cost may very based on individual plan coverage.

## 2022-08-16 NOTE — Progress Notes (Signed)
Mobility Specialist Progress Note:   08/16/22 1605  Mobility  Activity Transferred to/from [facility]  Level of Assistance Moderate assist, patient does 50-74%  Assistive Device Front wheel walker;BSC  Distance Ambulated (ft) 3 ft  Activity Response Tolerated well  Mobility Referral Yes  $Mobility charge 1 Mobility   NT requesting assistance to transfer pt off BSC. Required modA to stand and pivot to bed with RW, maxA for bed mobility. Pt back in bed with all needs met, bed alarm on.   Nelta Numbers Mobility Specialist Please contact via SecureChat or  Rehab office at [phone]

## 2022-08-16 NOTE — Progress Notes (Signed)
Pharmacy Antibiotic Note  Kathy [name] is a 82 y.o. female admitted on 08/13/2022 presenting with confusion, recent UTI, Cx with resistant E. Coli sens to carbapenems.  Pharmacy has been consulted for Merrem dosing.  Currently on day#4 of antibiotics. Afebrile. Scr 1.28 (CrCl 37 mL/min).   Plan: Merrem 1g IV every 12 hours Monitor renal function, Cx to narrow F/u ID recs   Height: 5' (152.4 cm) Weight: 102.1 kg (225 lb) IBW/kg (Calculated) : 45.5  Temp (24hrs), Avg:98.1 F (36.7 C), Min:97.5 F (36.4 C), Max:99.1 F (37.3 C)  Recent Labs  Lab 08/13/22 1541 08/13/22 1752 08/13/22 2238 08/14/22 0727 08/15/22 0806 08/15/22 1313 08/16/22 0251  WBC 13.1*  --   --  19.1*  --  18.5* 14.9*  CREATININE 1.36*  --   --  1.15* 1.40*  --  1.28*  LATICACIDVEN  --  3.0* 1.2  --   --   --   --      Estimated Creatinine Clearance: 37.1 mL/min (A) (by C-G formula based on SCr of 1.28 mg/dL (H)).    Allergies  Allergen Reactions   Lipitor [Atorvastatin] Other (See Comments)    Memory issues   Requip [Ropinirole Hcl] Other (See Comments)    Pt reports feeling generally unwell on this medication    Antonietta Jewel, PharmD, [place] Pharmacist  Phone: [phone] 08/16/2022 11:52 AM  Please check AMION for all Walker phone numbers After 10:00 PM, call [place] [phone]

## 2022-08-17 DIAGNOSIS — I4891 Unspecified atrial fibrillation: Secondary | ICD-10-CM | POA: Diagnosis not present

## 2022-08-17 LAB — BASIC METABOLIC PANEL
Anion gap: 8 (ref 5–15)
BUN: 30 mg/dL — ABNORMAL HIGH (ref 8–23)
CO2: 32 mmol/L (ref 22–32)
Calcium: 8.9 mg/dL (ref 8.9–10.3)
Chloride: 97 mmol/L — ABNORMAL LOW (ref 98–111)
Creatinine, Ser: 1.12 mg/dL — ABNORMAL HIGH (ref 0.44–1.00)
GFR, Estimated: 49 mL/min — ABNORMAL LOW (ref >60)
Glucose, Bld: 98 mg/dL (ref 70–99)
Potassium: 4.9 mmol/L (ref 3.5–5.1)
Sodium: 137 mmol/L (ref 135–145)

## 2022-08-17 LAB — CBC
HCT: 34.8 % — ABNORMAL LOW (ref 36.0–46.0)
Hemoglobin: 10.9 g/dL — ABNORMAL LOW (ref 12.0–15.0)
MCH: 32.3 pg (ref 26.0–34.0)
MCHC: 31.3 g/dL (ref 30.0–36.0)
MCV: 103.3 fL — ABNORMAL HIGH (ref 80.0–100.0)
Platelets: 189 10*3/uL (ref 150–400)
RBC: 3.37 MIL/uL — ABNORMAL LOW (ref 3.87–5.11)
RDW: 13.3 % (ref 11.5–15.5)
WBC: 12.9 10*3/uL — ABNORMAL HIGH (ref 4.0–10.5)
nRBC: 0 % (ref 0.0–0.2)

## 2022-08-17 LAB — MAGNESIUM: Magnesium: 2.1 mg/dL (ref 1.7–2.4)

## 2022-08-17 MED ORDER — SODIUM CHLORIDE 0.9 % IV SOLN
500.0000 mg | INTRAVENOUS | Status: AC
  Administered 2022-08-18: 500 mg via INTRAVENOUS
  Filled 2022-08-17: qty 5

## 2022-08-17 MED ORDER — SODIUM CHLORIDE 0.9 % IV SOLN
1.0000 g | Freq: Two times a day (BID) | INTRAVENOUS | Status: AC
  Administered 2022-08-17 – 2022-08-18 (×2): 1 g via INTRAVENOUS
  Filled 2022-08-17 (×2): qty 20

## 2022-08-17 NOTE — Progress Notes (Addendum)
Physical Therapy Treatment Patient Details Name: Kathy [name] MRN: [id number] DOB: 04/16/1941 Today's Date: 08/17/2022   History of Present Illness 82 year old female admitted with AMS, SOB, Afib and increased cough.  Work up reveals community acquired PNA.  PMH significant for history of PAF, ILD with chronic hypoxic respiratory failure on 2-3 L at home, recurrent UTIs.  Daughter reports that she initially started to be confused around last Thanksgiving,  She has chronic knee problems and had steroid injection in to the knees couple days ago.    PT Comments    Patient progressing slowly towards PT goals. Session focused on transfers and gait training. 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 Tolerated short bouts of ambulation with Min A and use of rollator for support. Sp02 dropped to 87% on 3L/min 02 [place] with activity with 2-3/4 DOE. Per spouse, present in room, states that pt is still confused at times. Reports her baseline is a limited household ambulator walking to/from bathroom at home. Not sure pt will be able to tolerate intensity of AIR so discharge recommendation updated to SNF. If pt able to progress well enough to return home with support of spouse, can have HHPT. Will follow.   Recommendations for follow up therapy are one component of a multi-disciplinary discharge planning process, led by the attending physician.  Recommendations may be updated based on patient status, additional functional criteria and insurance authorization.  Follow Up Recommendations  Skilled nursing-short term rehab (<3 hours/day)     Assistance Recommended at Discharge Frequent or constant Supervision/Assistance  Patient can return home with the following A lot of help with walking and/or transfers;A lot of help with bathing/dressing/bathroom;Assistance with cooking/housework;Direct supervision/assist for medications management;Direct  supervision/assist for financial management   Equipment Recommendations  None recommended by PT    Recommendations for Other Services       Precautions / Restrictions Precautions Precautions: Fall;Other (comment) Precaution Comments: watch 02 Restrictions Weight Bearing Restrictions: No     Mobility  Bed Mobility Overal bed mobility: Needs Assistance Bed Mobility: Rolling, Sidelying to Sit Rolling: Mod assist Sidelying to sit: HOB elevated, Max assist       General bed mobility comments: Step by step cues for sequencing, use of rail, assist with LEs and trunk to get to EOB>    Transfers Overall transfer level: Needs assistance Equipment used: Rolling walker (2 wheels) Transfers: Sit to/from Stand, Bed to chair/wheelchair/BSC Sit to Stand: Mod assist, Min assist           General transfer comment: Min-Mod A to stand from EOB x1, from chair x2 with difficulty due to right knee OA. Cues for hand placement/technique.    Ambulation/Gait Ambulation/Gait assistance: Min assist Gait Distance (Feet): 5 Feet (+ 15') Assistive device: Rollator (4 wheels) Gait Pattern/deviations: Step-through pattern, Decreased stride length, Trunk flexed Gait velocity: decreased Gait velocity interpretation: <1.31 ft/sec, indicative of household ambulator   General Gait Details: Slow, mildly unsteady gait with use of rollator, needs cues to take off brakes when walking. flexed trunk with cues for upright. Sp02 dropped to 87% on 3L/min 02 [place]. 1 seated rest break. 2-3/4 DOE.   Stairs             Wheelchair Mobility    Modified Rankin (Stroke Patients Only)       Balance Overall balance assessment: Needs assistance Sitting-balance support: Feet supported, No upper extremity supported Sitting balance-Leahy Scale: Fair  Standing balance support: During functional activity, Bilateral upper extremity supported Standing balance-Leahy Scale: Poor Standing balance comment:  Requires UE support in standing.                            Cognition Arousal/Alertness: Awake/alert Behavior During Therapy: WFL for tasks assessed/performed Overall Cognitive Status: Impaired/Different from baseline Area of Impairment: Following commands, Safety/judgement, Awareness, Problem solving                       Following Commands: Follows one step commands with increased time     Problem Solving: Difficulty sequencing, Requires verbal cues, Requires tactile cues, Slow processing General Comments: per spouse, pt continues to be confused. Distracted.        Exercises      General Comments General comments (skin integrity, edema, etc.): Spouse present during session. Sp02 dropped to 87% on 3L/min 02 [place] with activity.      Pertinent Vitals/Pain Pain Assessment Pain Assessment: No/denies pain    Home Living                          Prior Function            PT Goals (current goals can now be found in the care plan section) Progress towards PT goals: Progressing toward goals    Frequency    Min 3X/week      PT Plan Discharge plan needs to be updated    Co-evaluation              AM-PAC PT "6 Clicks" Mobility   Outcome Measure  Help needed turning from your back to your side while in a flat bed without using bedrails?: A Lot Help needed moving from lying on your back to sitting on the side of a flat bed without using bedrails?: Total Help needed moving to and from a bed to a chair (including a wheelchair)?: A Lot Help needed standing up from a chair using your arms (e.g., wheelchair or bedside chair)?: A Lot Help needed to walk in hospital room?: A Lot Help needed climbing 3-5 steps with a railing? : Total 6 Click Score: 10    End of Session Equipment Utilized During Treatment: Gait belt;Oxygen Activity Tolerance: Patient limited by fatigue;Patient tolerated treatment well Patient left: in chair;with call  bell/phone within reach;with family/visitor present Nurse Communication: Mobility status PT Visit Diagnosis: Other abnormalities of gait and mobility (R26.89);Muscle weakness (generalized) (M62.81);History of falling (Z91.81);Difficulty in walking, not elsewhere classified (R26.2)     Time: [id number] PT Time Calculation (min) (ACUTE ONLY): 26 min  Charges:  $Gait Training: 8-22 mins $Therapeutic Activity: 8-22 mins                     Marisa Severin, PT, DPT Acute Rehabilitation Services Secure chat preferred Office [place] 08/17/2022, 2:18 PM

## 2022-08-17 NOTE — Progress Notes (Signed)
Subjective:  Feels better  Afib rate improved on combination of and IV diltiazem   Current Facility-Administered Medications:    acetaminophen (TYLENOL) tablet 650 mg, 650 mg, Oral, Q4H PRN, Etta Quill, DO, 650 mg at 08/14/22 0449   albuterol (PROVENTIL) (2.5 MG/3ML) 0.083% nebulizer solution 3 mL, 3 mL, Inhalation, Q6H PRN, Cruzita Lederer, Vella Redhead, MD   amitriptyline (ELAVIL) tablet 50 mg, 50 mg, Oral, QHS, Gherghe, Costin M, MD, 50 mg at 08/17/22 2221   apixaban (ELIQUIS) tablet 5 mg, 5 mg, Oral, BID, Alcario Drought, Jared M, DO, 5 mg at 08/17/22 2221   [START ON 08/18/2022] azithromycin (ZITHROMAX) 500 mg in sodium chloride 0.9 % 250 mL IVPB, 500 mg, Intravenous, Q24H, Gherghe, Costin M, MD   citalopram (CELEXA) tablet 10 mg, 10 mg, Oral, Daily, Cruzita Lederer, Costin M, MD, 10 mg at 08/17/22 0929   diltiazem (CARDIZEM SR) 12 hr capsule 90 mg, 90 mg, Oral, Q12H, Caren Griffins, MD, 90 mg at 08/17/22 2221   diltiazem (CARDIZEM) 125 mg in dextrose 5% 125 mL (1 mg/mL) infusion, 5-15 mg/hr, Intravenous, Continuous, Bernal Luhman J, MD, Last Rate: 5 mL/hr at 08/17/22 2222, 5 mg/hr at 08/17/22 2222   meropenem (MERREM) 1 g in sodium chloride 0.9 % 100 mL IVPB, 1 g, Intravenous, Q12H, Caren Griffins, MD, Last Rate: 200 mL/hr at 08/17/22 2222, Infusion Verify at 08/17/22 2222   metoprolol succinate (TOPROL-XL) 24 hr tablet 75 mg, 75 mg, Oral, Daily, Caren Griffins, MD, 75 mg at 08/17/22 1140   ondansetron (ZOFRAN) injection 4 mg, 4 mg, Intravenous, Q6H PRN, Etta Quill, DO   Oral care mouth rinse, 15 mL, Mouth Rinse, PRN, Tory Emerald, MD   pantoprazole (PROTONIX) EC tablet 40 mg, 40 mg, Oral, Daily, Caren Griffins, MD, 40 mg at 08/17/22 0930   Objective:  Vital Signs in the last 24 hours: Temp:  [97.7 F (36.5 C)-98.8 F (37.1 C)] 98.3 F (36.8 C) (02/20 1916) Pulse Rate:  [80-97] 85 (02/20 1916) Resp:  [15-20] 15 (02/20 1916) BP: (102-131)/(57-88) 123/75 (02/20 1916) SpO2:  [98  %-100 %] 98 % (02/20 1916)  Intake/Output from previous day: 02/19 0701 - 02/20 0700 In: 939.5 [I.V.:39.8; IV Piggyback:899.8] Out: 300 [Urine:300]  Physical Exam Vitals and nursing note reviewed.  Constitutional:      General: She is not in acute distress. Neck:     Vascular: No JVD.  Cardiovascular:     Rate and Rhythm: Tachycardia present. Rhythm irregular.     Heart sounds: Normal heart sounds. No murmur heard. Pulmonary:     Effort: Pulmonary effort is normal.     Breath sounds: Normal breath sounds. No wheezing or rales.  Musculoskeletal:     Right lower leg: No edema.     Left lower leg: No edema.      Imaging/tests reviewed and independently interpreted: No new images  Cardiac Studies:  Telemetry 08/16/2022: Afib w/RVR <120 bpm  EKG 08/13/2022: Atrial fibrillation Ventricular premature complex Borderline repolarization abnormality    Echocardiogram: Ordered  Assessment & Recommendations:  82 y.o. y.o. Caucasian female with hypertension, hyperlipidemia, interstitial lung disease, persistent Afib, pulmonary hypertension, admitted with UTI, CAP, altered mental status.  Cardiology consulted for management of A-fib.     A-fib with RVR: Persistent A-fib. Rate improved on IV diltiazem + PO diltiazem SR 90 mg bid, metoprolol succinate 75 mg daily, diltiazem SR 90 mg twice daily. Will monitor for further rate improvement. Still keep NPO 2/21 morning for possible TEE/cardioversion.  Dr. Einar Gip will round on 2/21 AM and make determination re: cardioversion or not. There is question regarding her compliance Eliquis at baseline, so will need TEE at the time of cardioversion/ She continue Eliquis 5 mg twice daily.   Chest pain: Known coronary atherosclerosis noted on prior imaging.  No ACS.  Her chest pain is likely from demand ischemia from time to time.  Given her advanced age, other ongoing noncardiac medical issues, recommend conservative and management at this  time.   Discussed interpretation of tests and management recommendations with the primary team   Nigel Mormon, MD Pager: [phone] Office: [phone]

## 2022-08-17 NOTE — Progress Notes (Addendum)
RE: Kathy [name]  Date of Birth: [birth year]  Date: 08/17/2022  To Whom It May Concern:  Please be advised that the above-named patient will require a short-term nursing home stay - anticipated 30 days or less for rehabilitation and strengthening. The plan is for return home

## 2022-08-17 NOTE — NC FL2 (Signed)
[place] LEVEL OF CARE FORM     IDENTIFICATION  Patient Name: Kathy [name] Birthdate: Jun 22, 1941 Sex: female Admission Date (Current Location): 08/13/2022  [facility] and [place] Number:  Herbalist and Address:  [facility], [place] [address] [place], [place] [address]      Provider Number: [id number]  Attending Physician Name and Address:  Caren Griffins, MD  Relative Name and Phone Number:  Timmothy Sours (Spouse) [phone]    Current Level of Care: Hospital Recommended Level of Care: [place] Prior Approval Number:    Date Approved/Denied:   PASRR Number: PASRR under review  Discharge Plan: SNF    Current Diagnoses: Patient Active Problem List   Diagnosis Date Noted   Atrial fibrillation with RVR ([place]) 08/14/2022   CAP (community acquired pneumonia) 08/14/2022   Confusion 08/03/2022   Depression, psychotic ([place]) 08/03/2022   Rash 06/29/2022   Foul smelling urine 05/31/2022   Wheezing 05/31/2022   UTI due to extended-spectrum beta lactamase (ESBL) producing Escherichia coli 12/11/2021   AMS (altered mental status) 12/02/2021   AKI (acute kidney injury) ([place]) 12/02/2021   Urinary frequency 09/22/2021   Peripheral neuropathy 03/23/2021   Pulmonary hypertension, unspecified ([place]) 12/04/2020   Atrial fibrillation ([place]) 07/11/2020   HFrEF (heart failure with reduced ejection fraction) ([place]) 07/11/2020   Positive ANA (antinuclear antibody) 05/01/2020   Osteoarthritis 05/01/2020   Bilateral primary osteoarthritis of knee 05/01/2020   Aortic atherosclerosis ([place]) 04/18/2020   COVID-19 virus infection 07/31/2019   Bilateral knee pain 06/10/2019   Leg swelling 02/13/2019   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 ([place]) [id number]   Physical deconditioning 02/13/2019   Vitamin D deficiency 12/04/2018   B12 deficiency 12/04/2018   Hyperglycemia 11/16/2018   Abdominal pain 06/02/2018   Acute on chronic kidney failure  ([place]) [id number]   Diastolic dysfunction [id number]   Endometrial ca ([place]) 11/30/2017   GERD (gastroesophageal reflux disease) 11/30/2017   HLD (hyperlipidemia) 11/30/2017   CKD (chronic kidney disease) stage 3, GFR 30-59 ml/min (HCC) 11/30/2017   Dyspnea on exertion 11/07/2017   Cough 11/07/2017   ILD (interstitial lung disease) ([place]) 03/15/2017   Chronic respiratory failure with hypoxia ([place]) 03/15/2017   Oral herpes simplex infection    Influenza with pneumonia 06/28/2016   Community acquired pneumonia 06/27/2016   Essential hypertension 06/27/2016   Depression 06/27/2016   Sleep apnea 06/27/2016    Orientation RESPIRATION BLADDER Height & Weight     Self, Time  O2 (Nasal Cannula 2 liters) Continent, External catheter (External Urinary Catheter) Weight: 225 lb (102.1 kg) Height:  5' (152.4 cm)  BEHAVIORAL SYMPTOMS/MOOD NEUROLOGICAL BOWEL NUTRITION STATUS      Continent Diet (Please see discharge summary)  AMBULATORY STATUS COMMUNICATION OF NEEDS Skin   Limited Assist Verbally Other (Comment) (Appropriate for ethnicity,dry,Ecchymosis,arm,leg,Wound/Incision LDAs,Erythema,breast,buttocks,Bil.)                       Personal Care Assistance Level of Assistance  Bathing, Feeding, Dressing Bathing Assistance: Maximum assistance Feeding assistance: Independent Dressing Assistance: Maximum assistance     Functional Limitations Info  Sight, Hearing, Speech   Hearing Info: Adequate (WDL) Speech Info: Adequate    SPECIAL CARE FACTORS FREQUENCY  PT (By licensed PT), OT (By licensed OT)     PT Frequency: 5x min weekly OT Frequency: 5x min weekly            Contractures Contractures Info: Not present    Additional Factors Info  Code Status, Allergies, Psychotropic Code Status Info: FULL Allergies Info: Lipitor (atorvastatin),Requip (ropinirole Hcl) Psychotropic Info: citalopram (CELEXA) tablet 10 mg daily         Current Medications (08/17/2022):  This is the  current hospital active medication list Current Facility-Administered Medications  Medication Dose Route Frequency Provider Last Rate Last Admin   acetaminophen (TYLENOL) tablet 650 mg  650 mg Oral Q4H PRN Etta Quill, DO   650 mg at 08/14/22 0449   albuterol (PROVENTIL) (2.5 MG/3ML) 0.083% nebulizer solution 3 mL  3 mL Inhalation Q6H PRN Caren Griffins, MD       amitriptyline (ELAVIL) tablet 50 mg  50 mg Oral QHS Caren Griffins, MD   50 mg at 08/16/22 2300   apixaban (ELIQUIS) tablet 5 mg  5 mg Oral BID Jennette Kettle M, DO   5 mg at 08/17/22 0929   [START ON 08/18/2022] azithromycin (ZITHROMAX) 500 mg in sodium chloride 0.9 % 250 mL IVPB  500 mg Intravenous Q24H Caren Griffins, MD       citalopram (CELEXA) tablet 10 mg  10 mg Oral Daily Caren Griffins, MD   10 mg at 08/17/22 [id number]   diltiazem (CARDIZEM SR) 12 hr capsule 90 mg  90 mg Oral Q12H Caren Griffins, MD   90 mg at 08/17/22 0929   diltiazem (CARDIZEM) 125 mg in dextrose 5% 125 mL (1 mg/mL) infusion  5-15 mg/hr Intravenous Continuous Patwardhan, Manish J, MD 5 mL/hr at 08/16/22 2313 5 mg/hr at 08/16/22 2313   meropenem (MERREM) 1 g in sodium chloride 0.9 % 100 mL IVPB  1 g Intravenous Q12H Caren Griffins, MD       metoprolol succinate (TOPROL-XL) 24 hr tablet 75 mg  75 mg Oral Daily Caren Griffins, MD   75 mg at 08/17/22 1140   ondansetron (ZOFRAN) injection 4 mg  4 mg Intravenous Q6H PRN Etta Quill, DO       Oral care mouth rinse  15 mL Mouth Rinse PRN Tory Emerald, MD       pantoprazole (PROTONIX) EC tablet 40 mg  40 mg Oral Daily Caren Griffins, MD   40 mg at 08/17/22 0930     Discharge Medications: Please see discharge summary for a list of discharge medications.  Relevant Imaging Results:  Relevant Lab Results:   Additional Information [phone]  Milas Gain, LCSWA

## 2022-08-17 NOTE — Progress Notes (Signed)
  Inpatient Rehabilitation Admissions Coordinator   Met with patient and spouse at bedside for rehab assessment. We discussed goals and expectations of a possible CIR admit. She has only had HH or OP therapy in the past. I discussed that I need to see how her tolerance with therapy is in the next 24 hrs before determining if she would be a candidate for CIR and that I would encourage them to further discuss their preference. I will follow up tomorrow.  Danne Baxter, RN, MSN Rehab Admissions Coordinator [phone] 08/17/2022 12:35 PM

## 2022-08-17 NOTE — Progress Notes (Signed)
PROGRESS NOTE  Kathy [name] [id number] DOB: 12-29-1940 DOA: 08/13/2022 PCP: Biagio Borg, MD   LOS: 3 days   Brief Narrative / Interim history:  82 year old female with history of PAF on Eliquis, CKD 3A with baseline creatinine 1.2-1.4, ILD with chronic hypoxic respiratory failure on 2-3 L at home, recurrent UTIs who comes to the hospital with altered mental status.  Daughter tells me that she initially started to be confused around last Thanksgiving, was diagnosed with a UTI and her mental status cleared with treatment.  Had recurrent UTI in January which was treated, microbiology showed ESBL E. coli.  She has been having intermittent confusion since, more so in the last few days, and she was brought to the ER.  Daughter also reports worsening shortness of breath and increased cough in the last few days.  She has chronic knee problems and had steroid injection in to the knees couple days ago.  In the ED she was found to be in A-fib with RVR and started on Cardizem drip.  Due to poorly controlled rates, cardiology was consulted  Subjective / 24h Interval events: Eating breakfast, mildly confused.  No family at bedside  Assesement and Plan: Principal Problem:   Atrial fibrillation with RVR ([place]) Active Problems:   Community acquired pneumonia   UTI due to extended-spectrum beta lactamase (ESBL) producing Escherichia coli   Essential hypertension   ILD (interstitial lung disease) (HCC)   CKD (chronic kidney disease) stage 3, GFR 30-59 ml/min (HCC)   CAP (community acquired pneumonia)   Principal problem Acute metabolic encephalopathy, likely due to community-acquired pneumonia -patient's imaging shows suspicious for right upper lobe pneumonia.  She is symptomatic with cough, congestion, and has a significant leukocytosis.  Has been placed on broad-spectrum antibiotics, continue for now, plan for total of 5 days with last day tomorrow.  White count improving.  Monitor blood cultures,  negative so far -Given her intermittent confusion even outside periods when she didn't have a clear cut infection, an MRI of the brain was obtained and it was fairly unremarkable without acute or subacute infarct.  I suspect she may have some developing underlying dementia, this was discussed with the patient's daughter   Active problems A-fib with RVR -off cardizem infusion, on metoprolol and po diltiazem.  Continue to up titrate Cardizem and metoprolol.  Cardiology consulted, appreciate input.  Currently on Cardizem infusion, may need cardioversion tomorrow   Recent ESBL E. coli UTI -urinalysis now clean.  Hyperkalemia-in the setting of high CKD, Lokelma x 1 yesterday, potassium improved this morning  Chronic kidney disease stage IIIa-creatinine baseline today   ILD, chronic hypoxic respiratory failure -respiratory status appears close to baseline   Essential hypertension-resume metoprolol, diltiazem.  Hold ARB  Scheduled Meds:  amitriptyline  50 mg Oral QHS   apixaban  5 mg Oral BID   citalopram  10 mg Oral Daily   diltiazem  90 mg Oral Q12H   metoprolol succinate  75 mg Oral Daily   pantoprazole  40 mg Oral Daily   Continuous Infusions:  [START ON 08/18/2022] azithromycin     diltiazem (CARDIZEM) infusion 5 mg/hr (08/16/22 2313)   meropenem (MERREM) IV     PRN Meds:.acetaminophen, albuterol, ondansetron (ZOFRAN) IV, mouth rinse  Current Outpatient Medications  Medication Instructions   albuterol (VENTOLIN HFA) 108 (90 Base) MCG/ACT inhaler 1-2 puffs, Inhalation, Every 6 hours PRN   amitriptyline (ELAVIL) 100 MG tablet TAKE 1/2 - 1 TABLET BY MOUTH AT BEDTIME FOR SLEEP AND FEET  PAIN   apixaban (ELIQUIS) 5 mg, Oral, 2 times daily   Cholecalciferol (VITAMIN D-3) 25 MCG (1000 UT) CAPS 1 capsule, Oral, Daily   citalopram (CELEXA) 10 mg, Oral, Daily   cyanocobalamin (VITAMIN B12) 1000 MCG/ML injection INJECT 1 ML INTO MUSCLE EVERY 30 DAYS   diltiazem (CARDIZEM) 120 mg, Oral, Daily    fenofibrate micronized (LOFIBRA) 134 MG capsule TAKE 1 CAPSULE BY MOUTH EVERY DAY IN THE MORNING BEFORE BREAKFAST   furosemide (LASIX) 40 mg, Oral, Every other day, 1 tab by mouth in the AM, and 1 tab by mouth in the PM as needed for persistent swelling or weight gain more than 3-5 lbs   losartan (COZAAR) 100 MG tablet TAKE 1/2 (ONE HALF) TABLET BY MOUTH ONCE DAILY   meloxicam (MOBIC) 15 mg, Oral, Daily   metoprolol succinate (TOPROL-XL) 50 mg, Oral, Daily, TAKE WITH OR IMMEDIATELY FOLLOWING A MEAL.   nitrofurantoin (macrocrystal-monohydrate) (MACROBID) 100 mg, Oral, 2 times daily   pantoprazole (PROTONIX) 40 mg, Oral, Daily   Respiratory Therapy Supplies (FLUTTER) DEVI Use as directed   triamcinolone cream (KENALOG) 0.5 % 1 Application, Topical, 2 times daily PRN   UNABLE TO FIND STUDY - ASPIRE - apixaban 5 mg or placebo tablet (PI-Sethi) Per instructions TWICE A DAY (route: oral)<BR><BR>    Diet Orders (From admission, onward)     Start     Ordered   08/14/22 0252  Diet Heart Room service appropriate? Yes; Fluid consistency: Thin  Diet effective now       Question Answer Comment  Room service appropriate? Yes   Fluid consistency: Thin      08/14/22 0253            DVT prophylaxis:  apixaban (ELIQUIS) tablet 5 mg   Lab Results  Component Value Date   PLT 189 08/17/2022      Code Status: Full Code  Family Communication: No family at bedside  Status is: Inpatient  Remains inpatient appropriate because: A-fib with RVR   Level of care: Telemetry Cardiac  Consultants:  none  Objective: Vitals:   08/16/22 2300 08/16/22 2326 08/16/22 2346 08/17/22 0348  BP: 126/88  114/81 (!) 102/57  Pulse:   97 87  Resp:   20 15  Temp:   98.8 F (37.1 C) 98.5 F (36.9 C)  TempSrc:   Oral Oral  SpO2:  100% 100% 100%  Weight:      Height:        Intake/Output Summary (Last 24 hours) at 08/17/2022 0941 Last data filed at 08/17/2022 0654 Gross per 24 hour  Intake 939.54 ml   Output 300 ml  Net 639.54 ml    Wt Readings from Last 3 Encounters:  08/13/22 102.1 kg  08/12/22 102.2 kg  08/03/22 101.6 kg    Examination:  Constitutional: NAD Eyes: lids and conjunctivae normal, no scleral icterus ENMT: mmm Neck: normal, supple Respiratory: Velcro type sounds the bases, no wheezing Cardiovascular: Irregular. No LE edema. Abdomen: soft, no distention, no tenderness. Bowel sounds positive.  Skin: no rashes Neurologic: no focal deficits, equal strength   Data Reviewed: I have independently reviewed following labs and imaging studies   CBC Recent Labs  Lab 08/13/22 1541 08/14/22 0727 08/15/22 1313 08/16/22 0251 08/17/22 0251  WBC 13.1* 19.1* 18.5* 14.9* 12.9*  HGB 11.3* 10.0* 11.2* 11.1* 10.9*  HCT 34.3* 31.2* 35.3* 34.7* 34.8*  PLT 191 222 179 230 189  MCV 100.6* 102.0* 101.1* 101.5* 103.3*  MCH 33.1 32.7 32.1 32.5  32.3  MCHC 32.9 32.1 31.7 32.0 31.3  RDW 13.3 13.5 13.7 13.4 13.3     Recent Labs  Lab 08/13/22 1541 08/13/22 1619 08/13/22 1752 08/13/22 2238 08/14/22 0727 08/15/22 0806 08/16/22 0251 08/17/22 0251  NA 137  --   --   --  135 135 138 137  K 4.6  --   --   --  5.4* 4.8 5.2* 4.9  CL 98  --   --   --  98 99 98 97*  CO2 28  --   --   --  27 26 29 $ 32  GLUCOSE 253*  --   --   --  141* 111* 115* 98  BUN 32*  --   --   --  31* 35* 38* 30*  CREATININE 1.36*  --   --   --  1.15* 1.40* 1.28* 1.12*  CALCIUM 9.5  --   --   --  8.9 9.3 9.4 8.9  AST 36  --   --   --   --  33  --   --   ALT 19  --   --   --   --  23  --   --   ALKPHOS 48  --   --   --   --  49  --   --   BILITOT 0.4  --   --   --   --  0.5  --   --   ALBUMIN 3.0*  --   --   --   --  3.0*  --   --   MG  --   --   --   --   --  2.2 2.3 2.1  LATICACIDVEN  --   --  3.0* 1.2  --   --   --   --   BNP  --  164.2*  --   --   --   --   --   --      ------------------------------------------------------------------------------------------------------------------ No results  for input(s): "CHOL", "HDL", "LDLCALC", "TRIG", "CHOLHDL", "LDLDIRECT" in the last 72 hours.  Lab Results  Component Value Date   HGBA1C 5.8 07/05/2022   ------------------------------------------------------------------------------------------------------------------ No results for input(s): "TSH", "T4TOTAL", "T3FREE", "THYROIDAB" in the last 72 hours.  Invalid input(s): "FREET3"  Cardiac Enzymes No results for input(s): "CKMB", "TROPONINI", "MYOGLOBIN" in the last 168 hours.  Invalid input(s): "CK" ------------------------------------------------------------------------------------------------------------------    Component Value Date/Time   BNP 164.2 (H) 08/13/2022 1619    CBG: Recent Labs  Lab 08/13/22 1609  GLUCAP 242*     Recent Results (from the past 240 hour(s))  Resp panel by RT-PCR (RSV, Flu A&B, Covid) Anterior Nasal Swab     Status: None   Collection Time: 08/13/22  4:19 PM   Specimen: Anterior Nasal Swab  Result Value Ref Range Status   SARS Coronavirus 2 by RT PCR NEGATIVE NEGATIVE Final   Influenza A by PCR NEGATIVE NEGATIVE Final   Influenza B by PCR NEGATIVE NEGATIVE Final    Comment: (NOTE) The Xpert Xpress SARS-CoV-2/FLU/RSV plus assay is intended as an aid in the diagnosis of influenza from Nasopharyngeal swab specimens and should not be used as a sole basis for treatment. Nasal washings and aspirates are unacceptable for Xpert Xpress SARS-CoV-2/FLU/RSV testing.  Fact Sheet for Patients: EntrepreneurPulse.com.au  Fact Sheet for Healthcare Providers: IncredibleEmployment.be  This test is not yet approved or cleared by the [place] FDA and has been authorized for detection  and/or diagnosis of SARS-CoV-2 by FDA under an Emergency Use Authorization (EUA). This EUA will remain in effect (meaning this test can be used) for the duration of the COVID-19 declaration under Section 564(b)(1) of the Act, 21  U.S.C. section 360bbb-3(b)(1), unless the authorization is terminated or revoked.     Resp Syncytial Virus by PCR NEGATIVE NEGATIVE Final    Comment: (NOTE) Fact Sheet for Patients: EntrepreneurPulse.com.au  Fact Sheet for Healthcare Providers: IncredibleEmployment.be  This test is not yet approved or cleared by the [place] FDA and has been authorized for detection and/or diagnosis of SARS-CoV-2 by FDA under an Emergency Use Authorization (EUA). This EUA will remain in effect (meaning this test can be used) for the duration of the COVID-19 declaration under Section 564(b)(1) of the Act, 21 U.S.C. section 360bbb-3(b)(1), unless the authorization is terminated or revoked.  Performed at [facility], [place] [address], [place], [place] [address]   Blood culture (routine x 2)     Status: None (Preliminary result)   Collection Time: 08/13/22  5:52 PM   Specimen: BLOOD  Result Value Ref Range Status   Specimen Description BLOOD LEFT ANTECUBITAL  Final   Special Requests   Final    BOTTLES DRAWN AEROBIC AND ANAEROBIC Blood Culture results may not be optimal due to an inadequate volume of blood received in culture bottles   Culture   Final    NO GROWTH 4 DAYS Performed at [facility], [place] [address] [place], [place] [address]    Report Status PENDING  Incomplete  Blood culture (routine x 2)     Status: None (Preliminary result)   Collection Time: 08/13/22  5:57 PM   Specimen: BLOOD  Result Value Ref Range Status   Specimen Description BLOOD RIGHT ANTECUBITAL  Final   Special Requests   Final    BOTTLES DRAWN AEROBIC AND ANAEROBIC Blood Culture results may not be optimal due to an inadequate volume of blood received in culture bottles   Culture   Final    NO GROWTH 4 DAYS Performed at [facility], [place] [address] [place], [place] [address]    Report Status PENDING  Incomplete     Radiology Studies: No results  found.   Marzetta Board, MD, PhD [facility]  Between 7 am - 7 pm I am available, please contact me via Amion (for emergencies) or Securechat (non urgent messages)  Between 7 pm - 7 am I am not available, please contact night coverage MD/APP via Amion

## 2022-08-17 NOTE — TOC Initial Note (Addendum)
Transition of Care [facility]) - Initial/Assessment Note    Patient Details  Name: Kathy [name] MRN: [id number] Date of Birth: 22-Aug-1940  Transition of Care [facility]) CM/SW Contact:    Milas Gain, [place] Phone Number: 08/17/2022, 4:08 PM  Clinical Narrative:                  CSW received consult for possible SNF placement at time of discharge. Due to patients current orientation CSW spoke with patients spouse regarding PT recommendation of SNF placement at time of discharge. Patients spouse expressed understanding of PT recommendation and is agreeable to SNF placement for patient at time of discharge. Patients spouse gave CSW permission to fax out initial referral near the [place] area for possible SNF placement. CSW discussed insurance authorization process.No further questions reported at this time. CSW to continue to follow and assist with discharge planning needs..Patients passr pending. CSW submitted clinicals for review.   Expected Discharge Plan: Skilled Nursing Facility Barriers to Discharge: Continued Medical Work up   Patient Goals and CMS Choice   CMS Medicare.gov Compare Post Acute Care list provided to:: Patient Represenative (must comment) Choice offered to / list presented to : Spouse      Expected Discharge Plan and Services In-house Referral: Clinical Social Work     Living arrangements for the past 2 months: Single Family Home                                      Prior Living Arrangements/Services Living arrangements for the past 2 months: Single Family Home Lives with:: Spouse Patient language and need for interpreter reviewed:: Yes Do you feel safe going back to the place where you live?: No   SNF  Need for Family Participation in Patient Care: Yes (Comment) Care giver support system in place?: Yes (comment)   Criminal Activity/Legal Involvement Pertinent to Current Situation/Hospitalization: No - Comment as needed  Activities of Daily  Living Home Assistive Devices/Equipment: Walker (specify type) ADL Screening (condition at time of admission) Patient's cognitive ability adequate to safely complete daily activities?: Yes Is the patient deaf or have difficulty hearing?: Yes Does the patient have difficulty seeing, even when wearing glasses/contacts?: No Does the patient have difficulty concentrating, remembering, or making decisions?: Yes Patient able to express need for assistance with ADLs?: Yes Does the patient have difficulty dressing or bathing?: Yes Independently performs ADLs?: Yes (appropriate for developmental age) Does the patient have difficulty walking or climbing stairs?: Yes Weakness of Legs: Both Weakness of Arms/Hands: None  Permission Sought/Granted Permission sought to share information with : Case Manager, Family Supports, Chartered certified accountant granted to share information with : No  Share Information with NAME: Due to patients current orientation CSW spoke with patients spouse Timmothy Sours  Permission granted to share info w AGENCY: Due to patients current orientation CSW spoke with patients spouse Don/SNF  Permission granted to share info w Relationship: Due to patients current orientation CSW spoke with patients spouse Timmothy Sours  Permission granted to share info w Contact Information: Due to patients current orientation CSW spoke with patients spouse Timmothy Sours [phone]  Emotional Assessment       Orientation: : Oriented to Self, Oriented to  Time Alcohol / Substance Use: Not Applicable Psych Involvement: No (comment)  Admission diagnosis:  CAP (community acquired pneumonia) [J18.9] Atrial fibrillation with RVR ([place]) [I48.91] Sepsis with encephalopathy without septic shock, due to unspecified  organism ([place]) [A41.9, R65.20, G93.41] Patient Active Problem List   Diagnosis Date Noted   Atrial fibrillation with RVR ([place]) 08/14/2022   CAP (community acquired pneumonia) 08/14/2022   Confusion  08/03/2022   Depression, psychotic ([place]) 08/03/2022   Rash 06/29/2022   Foul smelling urine 05/31/2022   Wheezing 05/31/2022   UTI due to extended-spectrum beta lactamase (ESBL) producing Escherichia coli 12/11/2021   AMS (altered mental status) 12/02/2021   AKI (acute kidney injury) ([place]) 12/02/2021   Urinary frequency 09/22/2021   Peripheral neuropathy 03/23/2021   Pulmonary hypertension, unspecified ([place]) 12/04/2020   Atrial fibrillation ([place]) 07/11/2020   HFrEF (heart failure with reduced ejection fraction) ([place]) 07/11/2020   Positive ANA (antinuclear antibody) 05/01/2020   Osteoarthritis 05/01/2020   Bilateral primary osteoarthritis of knee 05/01/2020   Aortic atherosclerosis ([place]) 04/18/2020   COVID-19 virus infection 07/31/2019   Bilateral knee pain 06/10/2019   Leg swelling 02/13/2019   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 ([place]) [id number]   Physical deconditioning 02/13/2019   Vitamin D deficiency 12/04/2018   B12 deficiency 12/04/2018   Hyperglycemia 11/16/2018   Abdominal pain 06/02/2018   Acute on chronic kidney failure ([place]) [id number]   Diastolic dysfunction [id number]   Endometrial ca ([place]) 11/30/2017   GERD (gastroesophageal reflux disease) 11/30/2017   HLD (hyperlipidemia) 11/30/2017   CKD (chronic kidney disease) stage 3, GFR 30-59 ml/min (HCC) 11/30/2017   Dyspnea on exertion 11/07/2017   Cough 11/07/2017   ILD (interstitial lung disease) ([place]) 03/15/2017   Chronic respiratory failure with hypoxia ([place]) 03/15/2017   Oral herpes simplex infection    Influenza with pneumonia 06/28/2016   Community acquired pneumonia 06/27/2016   Essential hypertension 06/27/2016   Depression 06/27/2016   Sleep apnea 06/27/2016   PCP:  Biagio Borg, MD Pharmacy:   [id number] [id number]: [phone]: [phone]    Social Determinants of Health (SDOH) Social History: SDOH Screenings    Food Insecurity: No Food Insecurity (08/14/2022)  Housing: Low Risk  (08/14/2022)  Transportation Needs: No Transportation Needs (08/14/2022)  Utilities: Not At Risk (08/14/2022)  Alcohol Screen: Low Risk  (02/09/2022)  Depression (PHQ2-9): Low Risk  (06/29/2022)  Financial Resource Strain: Low Risk  (02/09/2022)  Physical Activity: Inactive (02/09/2022)  Social Connections: Moderately Isolated (02/09/2022)  Stress: No Stress Concern Present (02/09/2022)  Tobacco Use: Medium Risk (08/13/2022)   SDOH Interventions:     Readmission Risk Interventions     No data to display

## 2022-08-18 ENCOUNTER — Inpatient Hospital Stay (HOSPITAL_COMMUNITY): Payer: Medicare Other | Admitting: Certified Registered Nurse Anesthetist

## 2022-08-18 ENCOUNTER — Encounter (HOSPITAL_COMMUNITY): Payer: Self-pay | Admitting: Internal Medicine

## 2022-08-18 ENCOUNTER — Encounter (HOSPITAL_COMMUNITY)
Admission: EM | Disposition: A | Discharge status: Skilled Nursing Facility | Payer: Self-pay | Source: Home / Self Care | Attending: Internal Medicine

## 2022-08-18 ENCOUNTER — Inpatient Hospital Stay (HOSPITAL_COMMUNITY): Payer: Medicare Other

## 2022-08-18 DIAGNOSIS — I130 Hypertensive heart and chronic kidney disease with heart failure and stage 1 through stage 4 chronic kidney disease, or unspecified chronic kidney disease: Secondary | ICD-10-CM

## 2022-08-18 DIAGNOSIS — Q2112 Patent foramen ovale: Secondary | ICD-10-CM | POA: Diagnosis not present

## 2022-08-18 DIAGNOSIS — Z87891 Personal history of nicotine dependence: Secondary | ICD-10-CM

## 2022-08-18 DIAGNOSIS — I4891 Unspecified atrial fibrillation: Secondary | ICD-10-CM | POA: Diagnosis not present

## 2022-08-18 DIAGNOSIS — N189 Chronic kidney disease, unspecified: Secondary | ICD-10-CM

## 2022-08-18 DIAGNOSIS — I509 Heart failure, unspecified: Secondary | ICD-10-CM | POA: Diagnosis not present

## 2022-08-18 HISTORY — PX: TEE WITHOUT CARDIOVERSION: SHX5443

## 2022-08-18 HISTORY — PX: CARDIOVERSION: SHX1299

## 2022-08-18 HISTORY — PX: BUBBLE STUDY: SHX6837

## 2022-08-18 LAB — ECHO TEE: Single Plane A4C EF: 51.3 %

## 2022-08-18 LAB — CULTURE, BLOOD (ROUTINE X 2)
Culture: NO GROWTH
Culture: NO GROWTH

## 2022-08-18 LAB — CBC
HCT: 32.6 % — ABNORMAL LOW (ref 36.0–46.0)
Hemoglobin: 10.5 g/dL — ABNORMAL LOW (ref 12.0–15.0)
MCH: 32.4 pg (ref 26.0–34.0)
MCHC: 32.2 g/dL (ref 30.0–36.0)
MCV: 100.6 fL — ABNORMAL HIGH (ref 80.0–100.0)
Platelets: 194 10*3/uL (ref 150–400)
RBC: 3.24 MIL/uL — ABNORMAL LOW (ref 3.87–5.11)
RDW: 13 % (ref 11.5–15.5)
WBC: 11.9 10*3/uL — ABNORMAL HIGH (ref 4.0–10.5)
nRBC: 0 % (ref 0.0–0.2)

## 2022-08-18 LAB — BASIC METABOLIC PANEL
Anion gap: 8 (ref 5–15)
BUN: 26 mg/dL — ABNORMAL HIGH (ref 8–23)
CO2: 32 mmol/L (ref 22–32)
Calcium: 8.8 mg/dL — ABNORMAL LOW (ref 8.9–10.3)
Chloride: 94 mmol/L — ABNORMAL LOW (ref 98–111)
Creatinine, Ser: 1.03 mg/dL — ABNORMAL HIGH (ref 0.44–1.00)
GFR, Estimated: 55 mL/min — ABNORMAL LOW (ref >60)
Glucose, Bld: 132 mg/dL — ABNORMAL HIGH (ref 70–99)
Potassium: 4.3 mmol/L (ref 3.5–5.1)
Sodium: 134 mmol/L — ABNORMAL LOW (ref 135–145)

## 2022-08-18 LAB — MAGNESIUM: Magnesium: 2.1 mg/dL (ref 1.7–2.4)

## 2022-08-18 SURGERY — ECHOCARDIOGRAM, TRANSESOPHAGEAL
Anesthesia: Monitor Anesthesia Care

## 2022-08-18 MED ORDER — SODIUM CHLORIDE 0.9 % IV SOLN: INTRAVENOUS | Status: DC

## 2022-08-18 MED ORDER — SOTALOL HCL 80 MG PO TABS
40.0000 mg | ORAL_TABLET | Freq: Two times a day (BID) | ORAL | Status: DC
  Administered 2022-08-18 – 2022-08-21 (×6): 40 mg via ORAL
  Filled 2022-08-18 (×7): qty 0.5

## 2022-08-18 MED ORDER — METOPROLOL TARTRATE 25 MG PO TABS
25.0000 mg | ORAL_TABLET | Freq: Once | ORAL | Status: AC
  Administered 2022-08-18: 25 mg via ORAL
  Filled 2022-08-18: qty 1

## 2022-08-18 MED ORDER — PROPOFOL 500 MG/50ML IV EMUL
INTRAVENOUS | Status: DC | PRN
  Administered 2022-08-18: 50 ug/kg/min via INTRAVENOUS

## 2022-08-18 MED ORDER — PROPOFOL 10 MG/ML IV BOLUS
INTRAVENOUS | Status: DC | PRN
  Administered 2022-08-18 (×2): 20 mg via INTRAVENOUS

## 2022-08-18 MED ORDER — LIDOCAINE VISCOUS HCL 2 % MT SOLN
OROMUCOSAL | Status: DC | PRN
  Administered 2022-08-18: 10 mL via OROMUCOSAL

## 2022-08-18 MED ORDER — LIDOCAINE VISCOUS HCL 2 % MT SOLN
OROMUCOSAL | Status: AC
  Filled 2022-08-18: qty 15

## 2022-08-18 MED ORDER — PHENYLEPHRINE HCL-NACL 20-0.9 MG/250ML-% IV SOLN
INTRAVENOUS | Status: DC | PRN
  Administered 2022-08-18: 50 ug/min via INTRAVENOUS

## 2022-08-18 NOTE — Interval H&P Note (Signed)
History and Physical Interval Note:  08/18/2022 1:57 PM  Kathy [name]  has presented today for surgery, with the diagnosis of afib.  The various methods of treatment have been discussed with the patient and family. After consideration of risks, benefits and other options for treatment, the patient has consented to  Procedure(s): TRANSESOPHAGEAL ECHOCARDIOGRAM (TEE) (N/A) CARDIOVERSION (N/A) as a surgical intervention.  The patient's history has been reviewed, patient examined, no change in status, stable for surgery.  I have reviewed the patient's chart and labs.  Questions were answered to the patient's satisfaction.    Verbal consent discuss with her husband over the phone.   After careful review of history and examination, the risks, benefits of transesophageal echocardiogram, and alternatives have been explained to the patient and husband. Complications include but not limited to esophageal perforation (rare), gastrointestinal bleeding (rare), cardiac arrhythmia which can include cardiac arrest and death (rare), pharyngeal irritation / discomfort with swallowing / hematoma, methemoglobinemia, bronchospasm, transient hypoxia, nonsustained ventricular tachycardia, transient atrial fibrillation, minimal hemoptysis, vomiting, hypotension, respiratory compromise, reaction to medications, unavoidable damage to teeth and gums, aspiration pneumonia  were reviewed with the patient.  Patient voices understands, provides verbal feedback, questions answered, and patient and husband wishes to proceed with the procedure.  Risks, benefits, and alternatives of direct current cardioversion reviewed with the and patient and husband. Risk includes but not limited to: potential for post-cardioversion rhythms, life-threatening arrhythmias (ventricular tachycardia and fibrillation, profound bradycardia, cardiac arrest), myocardial damage, acute pulmonary edema, skin burns, transient hypotension. Benefits include restoration  of sinus rhythm. Alternatives to treatment were discussed, questions were answered, patient and husband voices understanding and provides verbal feedback.  Patient is willing to proceed.   Zain Bingman [place], DO, [facility]

## 2022-08-18 NOTE — TOC Progression Note (Addendum)
Transition of Care [facility]) - Progression Note    Patient Details  Name: Kathy [name] MRN: [id number] Date of Birth: [birth year]  Transition of Care [facility]) CM/SW [place], [place] Phone Number: 08/18/2022, 12:37 PM  Clinical Narrative:     Patients passr has been approved. CSW added to patients FL2.CSW provided patients spouse with SNF bed offers. Patients spouse gave his daughter n law [name] permisson to give CSW a call to help with SNF choice for patient. CSW awaiting call. CSW will continue to follow and assist with patients dc planning needs.   Update CSW received call back from [place] daughter n law regarding snf choices. CSW spoke with [name] and provided SNF bed offers. [name] chose SN[facility]. Patients spouse is also in agreement with [facility]. [name] request with patients spouse permission call back from Chaplain to answer HCPOA questions. CSW paged chaplin. CSW received call back from [facility] and informed her of request.CSW called [name] and LVM. CSW awaiting call back to confirm SNF bed for patient.CSW will continue to follow and   CSW heard back from [facility] who confirmed SNF bed for patient when medically ready for dc.  Expected Discharge Plan: [place] Barriers to Discharge: Continued Medical Work up  Expected Discharge Plan and Services In-house Referral: Clinical Social Work     Living arrangements for the past 2 months: Single Family Home                                       Social Determinants of Health (SDOH) Interventions SDOH Screenings   Food Insecurity: No Food Insecurity (08/14/2022)  Housing: Low Risk  (08/14/2022)  Transportation Needs: No Transportation Needs (08/14/2022)  Utilities: Not At Risk (08/14/2022)  Alcohol Screen: Low Risk  (02/09/2022)  Depression (PHQ2-9): Low Risk  (06/29/2022)  Financial Resource Strain: Low Risk  (02/09/2022)  Physical Activity: Inactive  (02/09/2022)  Social Connections: Moderately Isolated (02/09/2022)  Stress: No Stress Concern Present (02/09/2022)  Tobacco Use: Medium Risk (08/13/2022)    Readmission Risk Interventions     No data to display

## 2022-08-18 NOTE — CV Procedure (Addendum)
Transesophageal echocardiogram (TEE) : Preliminary report 08/18/22  Sedation: see anesthesia records.   TEE was performed without complications   LV: [id number]. RV: Normal structure and function (grossly).  LA: Dilated.  Spontaneous echo contrast was present.  No thrombus present. Left atrial appendage: Spontaneous echo contrast was not present.  No thrombus present. 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  RA: Grossly normal.     MV: mild regurgitation,no stenosis TV: mild regurgitation,no stenosis AV: no regurgitation,no stenosis   PV: mild regurgitation, no stenosis   Thoracic and ascending aorta: Mild calcified plaque.   Final report forthcoming.  Kathy [name] [facility]  Pager: [phone] Office: [phone]  Direct current cardioversion:  Indication symptomatic: Symptomatic atrial fibrillation.   Procedure Details: Consent: Risks of procedure as well as the alternatives and risks of each were explained to the (patient/caregiver).  Consent for procedure obtained. Verbal consent obtain as well w/ her husband over the phone due to concerns for patient having dementia.   Time Out: Verified patient identification, verified procedure, site/side was marked, verified correct patient position, special equipment/implants available, medications/allergies/relevent history reviewed, required imaging and test results available. PERFORMED.  Patient placed on cardiac monitor, pulse oximetry, supplemental oxygen as necessary.  Sedation given:  see anesthesia Pacer pads placed anterior and posterior chest.  Cardioverted 1 time(s).  Cardioversion with synchronized biphasic 200J shock.  Evaluation: Findings: Post procedure EKG shows: NSR w/ ectopy.  Complications: None Patient did tolerate procedure well.  Time Spent Directly with the Patient:   Kathy [name] 08/18/2022, 2:48 PM

## 2022-08-18 NOTE — Progress Notes (Signed)
PROGRESS NOTE    Kathy [name]  [id number] DOB: Nov 25, 1940 DOA: 08/13/2022 PCP: Biagio Borg, MD  Chief Complaint  Patient presents with   Altered Mental Status    Brief Narrative:   82 year old female with history of PAF on Eliquis, CKD 3A with baseline creatinine 1.2-1.4, ILD with chronic hypoxic respiratory failure on 2-3 L at home, recurrent UTIs who comes to the hospital with altered mental status.  Daughter tells me that she initially started to be confused around last Thanksgiving, was diagnosed with Kathy [name] UTI and her mental status cleared with treatment.  Had recurrent UTI in January which was treated, microbiology showed ESBL E. coli.  She has been having intermittent confusion since, more so in the last few days, and she was brought to the ER.  Daughter also reports worsening shortness of breath and increased cough in the last few days.  She has chronic knee problems and had steroid injection in to the knees couple days ago.  In the ED she was found to be in Tahira Olivarez-fib with RVR and started on Cardizem drip.  Due to poorly controlled rates, cardiology was consulted   Assessment & Plan:   Principal Problem:   Atrial fibrillation with RVR ([place]) Active Problems:   Community acquired pneumonia   UTI due to extended-spectrum beta lactamase (ESBL) producing Escherichia coli   Essential hypertension   ILD (interstitial lung disease) (HCC)   CKD (chronic kidney disease) stage 3, GFR 30-59 ml/min (HCC)   CAP (community acquired pneumonia)   Acute metabolic encephalopathy, likely due to community-acquired pneumonia -patient's imaging shows suspicious for right upper lobe pneumonia.  She is symptomatic with cough, congestion, and has Korver Graybeal significant leukocytosis.  Has been placed on broad-spectrum antibiotics, continue for now, plan for total of 5 days with last day today.  White count improving.  Monitor blood cultures, negative so far "Given her intermittent confusion even outside periods when she  didn't have Kathy [name] clear cut infection, an MRI of the brain was obtained and it was fairly unremarkable without acute or subacute infarct." - per prior hospitalist   Tayquan Gassman-fib with RVR - now s/p TEE cardioversion with cards Continue eliquis, metoprolol, dilt per cards  Recent ESBL E. coli UTI -urinalysis now clean.  Getting treated for above with carbapenem.   Hyperkalemia- resolved   Chronic kidney disease stage IIIa- appears at baseline   ILD, chronic hypoxic respiratory failure -respiratory status appears close to baseline   Essential hypertension-resume metoprolol, diltiazem.  Hold ARB     DVT prophylaxis: eliquis Code Status: full Family Communication: husband Disposition:   Status is: Inpatient Remains inpatient appropriate because: pending further improvemetn   Consultants:  cardiology  Procedures:  TEE Cardioversion IMPRESSIONS     1. Left ventricular ejection fraction, by estimation, is 50 to 55%. The  left ventricle has low normal function. The left ventricle has no regional  wall motion abnormalities.   2. Right ventricular systolic function is normal. The right ventricular  size is normal.   3. Left atrial size was moderately dilated. No left atrial/left atrial  appendage thrombus was detected. The LAA emptying velocity was 26 cm/s.   4. Bradley Handyside small pericardial effusion is present. The pericardial effusion is  anterior to the right ventricle. There is no evidence of cardiac  tamponade.   5. The mitral valve is degenerative. Mild mitral valve regurgitation. No  evidence of mitral stenosis.   6. The aortic valve is tricuspid. Aortic valve regurgitation is not  visualized. Aortic  valve sclerosis is present, with no evidence of aortic  valve stenosis.   7. There is mild (Grade II) plaque involving the descending aorta and  ascending aorta.   8. Small PFO cannot be ruled out per color doppler. Agitated saline  contrast bubble study was negative, with no evidence of any  interatrial  shunt.   9. Rhythm strip during this exam demonstrates atrial fibrillation.   Comparison(s): TTE from 02/18/2020: LVEF 45-50%, indeterminate diastolic  parameter, RVSP 36.12mHG, mild LAE, estimated RAP 871mG.   Conclusion(s)/Recommendation(s): No LA/LAA thrombus identified. Successful  cardioversion performed with restoration of normal sinus rhythm.    Antimicrobials:  Anti-infectives (From admission, onward)    Start     Dose/Rate Route Frequency Ordered Stop   08/18/22 0400  azithromycin (ZITHROMAX) 500 mg in sodium chloride 0.9 % 250 mL IVPB        500 mg 250 mL/hr over 60 Minutes Intravenous Every 24 hours 08/17/22 0941 08/18/22 0518   08/17/22 2200  meropenem (MERREM) 1 g in sodium chloride 0.9 % 100 mL IVPB        1 g 200 mL/hr over 30 Minutes Intravenous Every 12 hours 08/17/22 0941 08/18/22 0947   08/14/22 0400  azithromycin (ZITHROMAX) 500 mg in sodium chloride 0.9 % 250 mL IVPB  Status:  Discontinued        500 mg 250 mL/hr over 60 Minutes Intravenous Every 24 hours 08/14/22 0306 08/17/22 0941   08/13/22 2115  meropenem (MERREM) 1 g in sodium chloride 0.9 % 100 mL IVPB  Status:  Discontinued        1 g 200 mL/hr over 30 Minutes Intravenous Every 12 hours 08/13/22 2108 08/17/22 0941       Subjective: No complaints Pleasantly confused, husband at bedside  Objective: Vitals:   08/18/22 1502 08/18/22 1511 08/18/22 1520 08/18/22 1549  BP: 122/68 119/69 121/62 103/63  Pulse: 63 64 66 64  Resp: 16 (!) 22 (!) 24 (!) 21  Temp:    (!) 97.4 F (36.3 C)  TempSrc:    Oral  SpO2: 98% 96% 95%   Weight:      Height:        Intake/Output Summary (Last 24 hours) at 08/18/2022 1732 Last data filed at 08/18/2022 1448 Gross per 24 hour  Intake 543.4 ml  Output --  Net 543.4 ml   Filed Weights   08/13/22 1538  Weight: 102.1 kg    Examination:  General exam: Appears calm and comfortable  Respiratory system: unlabored Cardiovascular system: irregularly  irreguar Gastrointestinal system: Abdomen is nondistended, soft and nontender.  Central nervous system: pleasantly confused Extremities: no LEE   Data Reviewed: I have personally reviewed following labs and imaging studies  CBC: Recent Labs  Lab 08/14/22 0727 08/15/22 1313 08/16/22 0251 08/17/22 0251 08/18/22 0358  WBC 19.1* 18.5* 14.9* 12.9* 11.9*  HGB 10.0* 11.2* 11.1* 10.9* 10.5*  HCT 31.2* 35.3* 34.7* 34.8* 32.6*  MCV 102.0* 101.1* 101.5* 103.3* 100.6*  PLT 222 179 230 189 [id number]  Basic Metabolic Panel: Recent Labs  Lab 08/14/22 0727 08/15/22 0806 08/16/22 0251 08/17/22 0251 08/18/22 0358  NA 135 135 138 137 134*  K 5.4* 4.8 5.2* 4.9 4.3  CL 98 99 98 97* 94*  CO2 27 26 29 $ 32 32  GLUCOSE 141* 111* 115* 98 132*  BUN 31* 35* 38* 30* 26*  CREATININE 1.15* 1.40* 1.28* 1.12* 1.03*  CALCIUM 8.9 9.3 9.4 8.9 8.8*  MG  --  2.2  2.3 2.1 2.1    GFR: Estimated Creatinine Clearance: 46.1 mL/min (Karriem Muench) (by C-G formula based on SCr of 1.03 mg/dL (H)).  Liver Function Tests: Recent Labs  Lab 08/13/22 1541 08/15/22 0806  AST 36 33  ALT 19 23  ALKPHOS 48 49  BILITOT 0.4 0.5  PROT 7.1 7.2  ALBUMIN 3.0* 3.0*    CBG: Recent Labs  Lab 08/13/22 1609  GLUCAP 242*     Recent Results (from the past 240 hour(s))  Resp panel by RT-PCR (RSV, Flu Jazmarie Biever&B, Covid) Anterior Nasal Swab     Status: None   Collection Time: 08/13/22  4:19 PM   Specimen: Anterior Nasal Swab  Result Value Ref Range Status   SARS Coronavirus 2 by RT PCR NEGATIVE NEGATIVE Final   Influenza Vian Fluegel by PCR NEGATIVE NEGATIVE Final   Influenza B by PCR NEGATIVE NEGATIVE Final    Comment: (NOTE) The Xpert Xpress SARS-CoV-2/FLU/RSV plus assay is intended as an aid in the diagnosis of influenza from Nasopharyngeal swab specimens and should not be used as Lucus Lambertson sole basis for treatment. Nasal washings and aspirates are unacceptable for Xpert Xpress SARS-CoV-2/FLU/RSV testing.  Fact Sheet for  Patients: EntrepreneurPulse.com.au  Fact Sheet for Healthcare Providers: IncredibleEmployment.be  This test is not yet approved or cleared by the [place] FDA and has been authorized for detection and/or diagnosis of SARS-CoV-2 by FDA under an Emergency Use Authorization (EUA). This EUA will remain in effect (meaning this test can be used) for the duration of the COVID-19 declaration under Section 564(b)(1) of the Act, 21 U.S.C. section 360bbb-3(b)(1), unless the authorization is terminated or revoked.     Resp Syncytial Virus by PCR NEGATIVE NEGATIVE Final    Comment: (NOTE) Fact Sheet for Patients: EntrepreneurPulse.com.au  Fact Sheet for Healthcare Providers: IncredibleEmployment.be  This test is not yet approved or cleared by the [place] FDA and has been authorized for detection and/or diagnosis of SARS-CoV-2 by FDA under an Emergency Use Authorization (EUA). This EUA will remain in effect (meaning this test can be used) for the duration of the COVID-19 declaration under Section 564(b)(1) of the Act, 21 U.S.C. section 360bbb-3(b)(1), unless the authorization is terminated or revoked.  Performed at [facility], [place] [address] [place], [place] [address]   Blood culture (routine x 2)     Status: None   Collection Time: 08/13/22  5:52 PM   Specimen: BLOOD  Result Value Ref Range Status   Specimen Description BLOOD LEFT ANTECUBITAL  Final   Special Requests   Final    BOTTLES DRAWN AEROBIC AND ANAEROBIC Blood Culture results may not be optimal due to an inadequate volume of blood received in culture bottles   Culture   Final    NO GROWTH 5 DAYS Performed at [facility], [place] [address], [place], [place] [address]    Report Status 08/18/2022 FINAL  Final  Blood culture (routine x 2)     Status: None   Collection Time: 08/13/22  5:57 PM   Specimen: BLOOD  Result Value Ref Range  Status   Specimen Description BLOOD RIGHT ANTECUBITAL  Final   Special Requests   Final    BOTTLES DRAWN AEROBIC AND ANAEROBIC Blood Culture results may not be optimal due to an inadequate volume of blood received in culture bottles   Culture   Final    NO GROWTH 5 DAYS Performed at [facility], [place] [address] [place], [place] [address]  Report Status 08/18/2022 FINAL  Final         Radiology Studies: ECHO TEE  Result Date: 08/18/2022    TRANSESOPHOGEAL ECHO REPORT   Patient Name:   Kathy [name] Date of Exam: 08/18/2022 Medical Rec #:  [id number]  Height:       60.0 in Accession #:    [id number] Weight:       225.0 lb Date of Birth:  02-Apr-1941  BSA:          1.962 m Patient Age:    64 years   BP:           137/61 mmHg Patient Gender: F          HR:           82 bpm. Exam Location:  Inpatient Procedure: Transesophageal Echo, Color Doppler, Cardiac Doppler and Saline            Contrast Bubble Study Indications:     I48.91* Unspecified atrial fibrillation  History:         Patient has prior history of Echocardiogram examinations, most                  recent 02/18/2020. Arrythmias:Atrial Fibrillation; Risk                  Factors:Hypertension, Dyslipidemia and Sleep Apnea.  Sonographer:     Raquel Sarna Senior RDCS Referring Phys:  [id number] Rex Kras Diagnosing Phys: Rex Kras DO PROCEDURE: After discussion of the risks and benefits of Grover Robinson TEE, an informed consent was obtained from the patient. The transesophogeal probe was passed without difficulty through the esophogus of the patient. Imaged were obtained with the patient in Tinamarie Przybylski supine position. Local oropharyngeal anesthetic was provided with viscous lidocaine. Sedation performed by different physician. The patient was monitored while under deep sedation. Anesthestetic sedation was provided intravenously by Anesthesiology: 138m of Propofol. Image quality was good. The patient's vital signs; including heart rate, blood pressure, and oxygen  saturation; remained stable throughout the procedure. Supplementary images were obtained from transthoracic windows as indicated to answer the clinical question. The patient developed no complications during the procedure. Anyelin Mogle successful direct current cardioversion was performed at 200 joules with 1 attempt.  IMPRESSIONS  1. Left ventricular ejection fraction, by estimation, is 50 to 55%. The left ventricle has low normal function. The left ventricle has no regional wall motion abnormalities.  2. Right ventricular systolic function is normal. The right ventricular size is normal.  3. Left atrial size was moderately dilated. No left atrial/left atrial appendage thrombus was detected. The LAA emptying velocity was 26 cm/s.  4. Cohan Stipes small pericardial effusion is present. The pericardial effusion is anterior to the right ventricle. There is no evidence of cardiac tamponade.  5. The mitral valve is degenerative. Mild mitral valve regurgitation. No evidence of mitral stenosis.  6. The aortic valve is tricuspid. Aortic valve regurgitation is not visualized. Aortic valve sclerosis is present, with no evidence of aortic valve stenosis.  7. There is mild (Grade II) plaque involving the descending aorta and ascending aorta.  8. Small PFO cannot be ruled out per color doppler. Agitated saline contrast bubble study was negative, with no evidence of any interatrial shunt.  9. Rhythm strip during this exam demonstrates atrial fibrillation. Comparison(s): TTE from 02/18/2020: LVEF 45-50%, indeterminate diastolic parameter, RVSP [id number] mild LAE, estimated RAP 856mG. Conclusion(s)/Recommendation(s): No LA/LAA thrombus identified. Successful cardioversion performed with restoration of normal sinus rhythm. FINDINGS  Left Ventricle: Left  ventricular ejection fraction, by estimation, is 50 to 55%. The left ventricle has low normal function. The left ventricle has no regional wall motion abnormalities. The left ventricular internal cavity  size was normal in size. Right Ventricle: The right ventricular size is normal. No increase in right ventricular wall thickness. Right ventricular systolic function is normal. Left Atrium: Left atrial size was moderately dilated. Spontaneous echo contrast was present in the left atrium and left atrial appendage. No left atrial/left atrial appendage thrombus was detected. The LAA emptying velocity was 26 cm/s. Right Atrium: Right atrial size was normal in size. Pericardium: Debbora Ang small pericardial effusion is present. The pericardial effusion is anterior to the right ventricle. There is no evidence of cardiac tamponade. Mitral Valve: The mitral valve is degenerative in appearance. Mild mitral valve regurgitation. No evidence of mitral valve stenosis. Tricuspid Valve: The tricuspid valve is normal in structure. Tricuspid valve regurgitation is mild . No evidence of tricuspid stenosis. Aortic Valve: The aortic valve is tricuspid. Aortic valve regurgitation is not visualized. Aortic valve sclerosis is present, with no evidence of aortic valve stenosis. Pulmonic Valve: The pulmonic valve was grossly normal. Pulmonic valve regurgitation is mild. No evidence of pulmonic stenosis. Aorta: The aortic root and ascending aorta are structurally normal, with no evidence of dilitation. There is mild (Grade II) plaque involving the descending aorta and ascending aorta. IAS/Shunts: The interatrial septum appears to be lipomatous. Small PFO cannot be ruled out per color doppler. Agitated saline contrast was given intravenously to evaluate for intracardiac shunting. Agitated saline contrast bubble study was negative, with  no evidence of any interatrial shunt. EKG: Rhythm strip during this exam demonstrates atrial fibrillation. Additional Comments: Spectral Doppler performed. LEFT VENTRICLE PLAX 2D LVOT diam:     2.10 cm LVOT Area:     3.46 cm  LV Volumes (MOD) LV vol d, MOD A4C: 104.0 ml LV vol s, MOD A4C: 50.6 ml LV SV MOD A4C:      104.0 ml  SHUNTS Systemic Diam: 2.10 cm Sunit Tolia DO Electronically signed by Rex Kras DO Signature Date/Time: 08/18/2022/5:14:45 PM    Final         Scheduled Meds:  amitriptyline  50 mg Oral QHS   apixaban  5 mg Oral BID   citalopram  10 mg Oral Daily   diltiazem  90 mg Oral Q12H   metoprolol succinate  75 mg Oral Daily   metoprolol tartrate  25 mg Oral Once   pantoprazole  40 mg Oral Daily   Continuous Infusions:  diltiazem (CARDIZEM) infusion 5 mg/hr (08/18/22 1548)     LOS: 4 days    Time spent: over 30 min    Fayrene Helper, MD [facility]   To contact the attending provider between 7A-7P or the covering provider during after hours 7P-7A, please log into the web site [email or web address] and access using universal [place] password for that web site. If you do not have the password, please call the hospital operator.  08/18/2022, 5:32 PM

## 2022-08-18 NOTE — H&P (View-Only) (Signed)
Subjective:  Patient has advanced dementia, does answer appropriately yes and no however was asking me when I should make an appointment to see her back in the office.  Appears slightly confused.  Has mild shortness of breath but denies any chest pain or palpitations.  Intake/Output from previous day:  I/O last 3 completed shifts: In: 1040.3 [I.V.:138.2; IV Piggyback:902.1] Out: 300 [Urine:300] No intake/output data recorded. Net IO Since Admission: 1,448.42 mL [08/18/22 0717]  Blood pressure 132/70, pulse 97, temperature 98.3 F (36.8 C), temperature source Oral, resp. rate 17, height 5' (1.524 m), weight 102.1 kg, SpO2 98 %. Physical Exam Constitutional:      General: She is sleeping.     Appearance: She is morbidly obese.  Neck:     Vascular: No carotid bruit or JVD.  Cardiovascular:     Rate and Rhythm: Tachycardia present. Rhythm irregular.     Pulses: Intact distal pulses.     Heart sounds: Normal heart sounds. No murmur heard.    No gallop.  Pulmonary:     Effort: Pulmonary effort is normal.     Breath sounds: Examination of the right-middle field reveals rales. Examination of the left-middle field reveals rales. Examination of the right-lower field reveals rales. Examination of the left-lower field reveals rales. Rales (Coarse leathery crackles) present.  Abdominal:     General: Bowel sounds are normal.     Palpations: Abdomen is soft.  Musculoskeletal:     Right lower leg: No edema.     Left lower leg: No edema.  Neurological:     Mental Status: She is easily aroused.     Lab Results: Lab Results  Component Value Date   NA 134 (L) 08/18/2022   K 4.3 08/18/2022   CO2 32 08/18/2022   GLUCOSE 132 (H) 08/18/2022   BUN 26 (H) 08/18/2022   CREATININE 1.03 (H) 08/18/2022   CALCIUM 8.8 (L) 08/18/2022   GFRNONAA 55 (L) 08/18/2022    BNP (last 3 results) Recent Labs    08/13/22 1619  BNP 164.2*    ProBNP (last 3 results) Recent Labs    12/11/21 1456  PROBNP  143.0*      Latest Ref Rng & Units 08/18/2022    3:58 AM 08/17/2022    2:51 AM 08/16/2022    2:51 AM  BMP  Glucose 70 - 99 mg/dL 132  98  115   BUN 8 - 23 mg/dL 26  30  38   Creatinine 0.44 - 1.00 mg/dL 1.03  1.12  1.28   Sodium 135 - 145 mmol/L 134  137  138   Potassium 3.5 - 5.1 mmol/L 4.3  4.9  5.2   Chloride 98 - 111 mmol/L 94  97  98   CO2 22 - 32 mmol/L 32  32  29   Calcium 8.9 - 10.3 mg/dL 8.8  8.9  9.4       Latest Ref Rng & Units 08/15/2022    8:06 AM 08/13/2022    3:41 PM 08/03/2022    3:38 PM  Hepatic Function  Total Protein 6.5 - 8.1 g/dL 7.2  7.1  7.9   Albumin 3.5 - 5.0 g/dL 3.0  3.0  3.9   AST 15 - 41 U/L 33  36  24   ALT 0 - 44 U/L 23  19  13   $ Alk Phosphatase 38 - 126 U/L 49  48  46   Total Bilirubin 0.3 - 1.2 mg/dL 0.5  0.4  0.6  Bilirubin, Direct 0.0 - 0.3 mg/dL   0.2       Latest Ref Rng & Units 08/18/2022    3:58 AM 08/17/2022    2:51 AM 08/16/2022    2:51 AM  CBC  WBC 4.0 - 10.5 K/uL 11.9  12.9  14.9   Hemoglobin 12.0 - 15.0 g/dL 10.5  10.9  11.1   Hematocrit 36.0 - 46.0 % 32.6  34.8  34.7   Platelets 150 - 400 K/uL 194  189  230    Lipid Panel     Component Value Date/Time   CHOL 149 07/05/2022 1016   TRIG 77.0 07/05/2022 1016   HDL 55.90 07/05/2022 1016   CHOLHDL 3 07/05/2022 1016   VLDL 15.4 07/05/2022 1016   LDLCALC 78 07/05/2022 1016   HEMOGLOBIN A1C Lab Results  Component Value Date   HGBA1C 5.8 07/05/2022   TSH Recent Labs    09/22/21 0939 07/05/22 1016  TSH 2.44 2.17   Imaging: CXR 08/13/2022: Rotated AP portable examination. Cardiomegaly with diffuse bilateral interstitial pulmonary opacity, likely edema. No focal airspace opacity.  Cardiac Studies:  EKG: EKG 08/13/2022: Atrial fibrillation with rapid ventricular response at the rate of 125 bpm, normal axis, diffuse nonspecific T abnormality.  Low-voltage complexes.  Echocardiogram 02/18/2020:   1. Left ventricular ejection fraction, by estimation, is 45 to 50%. The left  ventricle has mildly decreased function. The left ventricle demonstrates global hypokinesis. Left ventricular diastolic parameters are indeterminate.  2. Right ventricular systolic function is normal. The right ventricular size is normal. There is mildly elevated pulmonary artery systolic pressure. The estimated right ventricular systolic pressure is 99991111 mmHg.  3. Left atrial size was mildly dilated.  4. The mitral valve is normal in structure. No evidence of mitral valve regurgitation. No evidence of mitral stenosis.  5. The aortic valve is tricuspid. Aortic valve regurgitation is not visualized. Mild aortic valve sclerosis is present, with no evidence of aortic valve stenosis.  6. The inferior vena cava is normal in size with <50% respiratory variability, suggesting right atrial pressure of 8 mmHg.  7. The patient appeared to be in atrial fibrillation with mild RVR.   Scheduled Meds:  amitriptyline  50 mg Oral QHS   apixaban  5 mg Oral BID   citalopram  10 mg Oral Daily   diltiazem  90 mg Oral Q12H   metoprolol succinate  75 mg Oral Daily   pantoprazole  40 mg Oral Daily   Continuous Infusions:  diltiazem (CARDIZEM) infusion 5 mg/hr (08/17/22 2222)   meropenem (MERREM) IV 200 mL/hr at 08/17/22 2222   PRN Meds:.acetaminophen, albuterol, ondansetron (ZOFRAN) IV, mouth rinse  Assessment  Kathy [name] is a 82 y.o. female patient with what appears like IPF, dementia, admitted to the hospital with admitted with UTI, CAP, altered mental status.  Cardiology consulted for management of A-fib.     Patient has functional and cognitive decline since Thanksgiving 2023.  Today during the exam she appeared to be confused as well.  I think she would be  Persistent atrial fibrillation.  Patient presently on apixaban.  Continue metoprolol and also diltiazem, proceed with direct-current cardioversion, TEE guided.  Indications include the patient's heart rate is not well-controlled.  Minimal activity even  in the bed heart rate jumps up to 100-110 bpm.  Patient's family has been updated yesterday with regard to plan for direct-current cardioversion and TEE.  Suspect she probably will need some form of antiarrhythmic therapy, in view of IPF, amiodarone  may not be a good choice, consider Multaq or could consider low-dose sotalol at 40 mg twice daily and discontinue metoprolol after cardioversion.    Adrian Prows, MD, [facility] 08/18/2022, 7:17 AM Office: [phone] Fax: [phone] Pager: [phone]

## 2022-08-18 NOTE — Anesthesia Preprocedure Evaluation (Signed)
Anesthesia Evaluation  Patient identified by MRN, date of birth, ID band Patient awake    Reviewed: Allergy & Precautions, NPO status , Patient's Chart, lab work & pertinent test results, reviewed documented beta blocker date and time   History of Anesthesia Complications (+) DIFFICULT IV STICK / SPECIAL LINE and history of anesthetic complications  Airway Mallampati: III  TM Distance: >3 FB Neck ROM: Limited    Dental  (+) Dental Advisory Given   Pulmonary shortness of breath and Long-Term Oxygen Therapy, sleep apnea , pneumonia, former smoker  Interstitial lung disease      rales    Cardiovascular Exercise Tolerance: Poor hypertension, pulmonary hypertension(-) angina +CHF and + DOE  + dysrhythmias (on Eliquis) Atrial Fibrillation  Rhythm:Regular Rate:Normal  Echo 01/2020  1. Left ventricular ejection fraction, by estimation, is 45 to 50%. The left ventricle has mildly decreased function. The left ventricle demonstrates global hypokinesis. Left ventricular diastolic parameters are indeterminate.   2. Right ventricular systolic function is normal. The right ventricular size is normal. There is mildly elevated pulmonary artery systolic pressure. The estimated right ventricular systolic pressure is 99991111 mmHg.   3. Left atrial size was mildly dilated.   4. The mitral valve is normal in structure. No evidence of mitral valve regurgitation. No evidence of mitral stenosis.   5. The aortic valve is tricuspid. Aortic valve regurgitation is not visualized. Mild aortic valve sclerosis is present, with no evidence of aortic valve stenosis.   6. The inferior vena cava is normal in size with <50% respiratory  variability, suggesting right atrial pressure of 8 mmHg.   7. The patient appeared to be in atrial fibrillation with mild RVR.     Neuro/Psych  PSYCHIATRIC DISORDERS  Depression     Neuromuscular disease (Peripheral neuropathy)     GI/Hepatic ,GERD  ,,  Endo/Other    Renal/GU CRFRenal disease     Musculoskeletal  (+) Arthritis , Osteoarthritis,    Abdominal  (+) + obese (BMI 43)  Peds  Hematology   Anesthesia Other Findings Endometrial cancer  Reproductive/Obstetrics                              Anesthesia Physical Anesthesia Plan  ASA: 4  Anesthesia Plan: MAC   Post-op Pain Management: Minimal or no pain anticipated   Induction: Intravenous  PONV Risk Score and Plan: 2 and TIVA, Midazolam, Treatment may vary due to age or medical condition and Propofol infusion  Airway Management Planned: Nasal Cannula  Additional Equipment:   Intra-op Plan:   Post-operative Plan:   Informed Consent: I have reviewed the patients History and Physical, chart, labs and discussed the procedure including the risks, benefits and alternatives for the proposed anesthesia with the patient or authorized representative who has indicated his/her understanding and acceptance.     Dental advisory given and Consent reviewed with POA  Plan Discussed with: CRNA  Anesthesia Plan Comments:          Anesthesia Quick Evaluation

## 2022-08-18 NOTE — Transfer of Care (Signed)
Immediate Anesthesia Transfer of Care Note  Patient: Kathy [name]  Procedure(s) Performed: TRANSESOPHAGEAL ECHOCARDIOGRAM (TEE) CARDIOVERSION BUBBLE STUDY  Patient Location: Endoscopy Unit  Anesthesia Type:MAC  Level of Consciousness: sedated  Airway & Oxygen Therapy: Patient Spontanous Breathing and Patient connected to nasal cannula oxygen  Post-op Assessment: Report given to RN and Post -op Vital signs reviewed and stable  Post vital signs: Reviewed and stable  Last Vitals:  Vitals Value Taken Time  BP 11/71   Temp 98   Pulse 61   Resp 16   SpO2 96     Last Pain:  Vitals:   08/18/22 1334  TempSrc: Temporal  PainSc: 0-No pain         Complications: No notable events documented.

## 2022-08-18 NOTE — Progress Notes (Signed)
Occupational Therapy Treatment Patient Details Name: Kathy [name] MRN: [id number] DOB: June 10, 1941 Today's Date: 08/18/2022   History of present illness 82 year old female admitted with AMS, SOB, Afib and increased cough.  Work up reveals community acquired PNA.  PMH significant for history of PAF, ILD with chronic hypoxic respiratory failure on 2-3 L at home, recurrent UTIs.  Daughter reports that she initially started to be confused around last Thanksgiving,  She has chronic knee problems and had steroid injection in to the knees couple days ago.   OT comments  Pt making slow progress with functional goals. Pt still demos confusion and impaired safety awareness during ADLs and ADL mobility tasks. Pt's husband present this session and very attentive and supportive. Post acute therapy d/c recommendations being changed to SNF at this time as best d/c venue for pt to maximize level of function and safety to return home. OT will continue to follow acutely to maximize level of function and safety   Recommendations for follow up therapy are one component of a multi-disciplinary discharge planning process, led by the attending physician.  Recommendations may be updated based on patient status, additional functional criteria and insurance authorization.    Follow Up Recommendations  Skilled nursing-short term rehab (<3 hours/day)     Assistance Recommended at Discharge Frequent or constant Supervision/Assistance  Patient can return home with the following  A lot of help with bathing/dressing/bathroom;A little help with walking and/or transfers;Other (comment)   Equipment Recommendations  Other (comment) (TBD at next venue of care)    Recommendations for Other Services      Precautions / Restrictions Precautions Precautions: Fall;Other (comment) Precaution Comments: watch 02 Restrictions Weight Bearing Restrictions: No       Mobility Bed Mobility Overal bed mobility: Needs Assistance              General bed mobility comments: pt seated on BSC with mobility tech upon arrival. Assisted pt back to bed at end of session; required total A with LEs onto bed, max A with UB and total A to scoot to [facility] using bed controls    Transfers Overall transfer level: Needs assistance Equipment used: Rolling walker (2 wheels) Transfers: Sit to/from Stand, Bed to chair/wheelchair/BSC Sit to Stand: Mod assist, Min assist     Step pivot transfers: Min assist     General transfer comment: Min-Mod A to stand from [facility], with difficulty due to right knee OA. Cues for hand placement/technique.     Balance Overall balance assessment: Needs assistance Sitting-balance support: Feet supported, No upper extremity supported Sitting balance-Leahy Scale: Fair     Standing balance support: During functional activity, Bilateral upper extremity supported Standing balance-Leahy Scale: Poor                             ADL either performed or assessed with clinical judgement   ADL Overall ADL's : Needs assistance/impaired     Grooming: Wash/dry hands;Wash/dry face;Oral care;Brushing hair;Min guard;Sitting Grooming Details (indicate cue type and reason): sitting EOB         Upper Body Dressing : Min guard;Sitting       Toilet Transfer: Minimal assistance;Cueing for safety;Cueing for sequencing;Rolling walker (2 wheels) Toilet Transfer Details (indicate cue type and reason): sit - stand from [facility] to RW and back to sitting ater perihygiene Toileting- Clothing Manipulation and Hygiene: Moderate assistance Toileting - Clothing Manipulation Details (indicate cue type and reason): hygiene min A, cues for perihygiene  front to back     Functional mobility during ADLs: Minimal assistance;Cueing for sequencing;Cueing for safety;Rolling walker (2 wheels) General ADL Comments: pt confused and having difficulty with instructions during grooming tasks seated EOB (pt drinking water from cup that  was instructed for rinsing during oral care and asking, "why is this cup here and what do I do?")    Extremity/Trunk Assessment Upper Extremity Assessment Upper Extremity Assessment: Generalized weakness   Lower Extremity Assessment Lower Extremity Assessment: Defer to PT evaluation        Vision Patient Visual Report: No change from baseline     Perception     Praxis      Cognition Arousal/Alertness: Awake/alert Behavior During Therapy: WFL for tasks assessed/performed Overall Cognitive Status: Impaired/Different from baseline Area of Impairment: Following commands, Safety/judgement, Awareness, Problem solving                     Memory: Decreased short-term memory Following Commands: Follows one step commands with increased time Safety/Judgement: Decreased awareness of safety, Decreased awareness of deficits   Problem Solving: Difficulty sequencing, Requires verbal cues, Requires tactile cues General Comments: pt continues to be confused        Exercises      Shoulder Instructions       General Comments      Pertinent Vitals/ Pain       Pain Assessment Pain Assessment: Faces Faces Pain Scale: Hurts little more Pain Location: knees durnig standing at RW Pain Descriptors / Indicators: Aching, Grimacing Pain Intervention(s): Monitored during session, Repositioned  Home Living                                          Prior Functioning/Environment              Frequency  Min 2X/week        Progress Toward Goals  OT Goals(current goals can now be found in the care plan section)  Progress towards OT goals: Progressing toward goals     Plan Discharge plan remains appropriate    Co-evaluation                 AM-PAC OT "6 Clicks" Daily Activity     Outcome Measure   Help from another person eating meals?: None Help from another person taking care of personal grooming?: A Little Help from another person  toileting, which includes using toliet, bedpan, or urinal?: A Lot Help from another person bathing (including washing, rinsing, drying)?: A Lot Help from another person to put on and taking off regular upper body clothing?: A Little Help from another person to put on and taking off regular lower body clothing?: Total 6 Click Score: 15    End of Session Equipment Utilized During Treatment: Gait belt;Rolling walker (2 wheels);Other (comment) (BSC)  OT Visit Diagnosis: Unsteadiness on feet (R26.81);Other abnormalities of gait and mobility (R26.89);Muscle weakness (generalized) (M62.81);Other symptoms and signs involving cognitive function   Activity Tolerance Patient limited by fatigue;Patient limited by pain   Patient Left in bed;with call bell/phone within reach;with bed alarm set;with family/visitor present   Nurse Communication          Time: [id number] OT Time Calculation (min): 36 min  Charges: OT General Charges $OT Visit: 1 Visit OT Treatments $Self Care/Home Management : 8-22 mins $Therapeutic Activity: 8-22 mins    Britt Bottom 08/18/2022,  12:56 PM

## 2022-08-18 NOTE — Plan of Care (Signed)

## 2022-08-18 NOTE — Progress Notes (Signed)
Inpatient Rehabilitation Admissions Coordinator   Noted SNF in process. We will sign off at this time.  Danne Baxter, RN, MSN Rehab Admissions Coordinator [phone] 08/18/2022 11:49 AM

## 2022-08-18 NOTE — Care Management Important Message (Signed)
Important Message  Patient Details  Name: Kathy [name] MRN: [id number] Date of Birth: 19-May-1941   Medicare Important Message Given:  Yes     Shelda Altes 08/18/2022, 10:45 AM

## 2022-08-18 NOTE — Progress Notes (Signed)
Subjective:  Patient has advanced dementia, does answer appropriately yes and no however was asking me when I should make an appointment to see her back in the office.  Appears slightly confused.  Has mild shortness of breath but denies any chest pain or palpitations.  Intake/Output from previous day:  I/O last 3 completed shifts: In: 1040.3 [I.V.:138.2; IV Piggyback:902.1] Out: 300 [Urine:300] No intake/output data recorded. Net IO Since Admission: 1,448.42 mL [08/18/22 0717]  Blood pressure 132/70, pulse 97, temperature 98.3 F (36.8 C), temperature source Oral, resp. rate 17, height 5' (1.524 m), weight 102.1 kg, SpO2 98 %. Physical Exam Constitutional:      General: She is sleeping.     Appearance: She is morbidly obese.  Neck:     Vascular: No carotid bruit or JVD.  Cardiovascular:     Rate and Rhythm: Tachycardia present. Rhythm irregular.     Pulses: Intact distal pulses.     Heart sounds: Normal heart sounds. No murmur heard.    No gallop.  Pulmonary:     Effort: Pulmonary effort is normal.     Breath sounds: Examination of the right-middle field reveals rales. Examination of the left-middle field reveals rales. Examination of the right-lower field reveals rales. Examination of the left-lower field reveals rales. Rales (Coarse leathery crackles) present.  Abdominal:     General: Bowel sounds are normal.     Palpations: Abdomen is soft.  Musculoskeletal:     Right lower leg: No edema.     Left lower leg: No edema.  Neurological:     Mental Status: She is easily aroused.     Lab Results: Lab Results  Component Value Date   NA 134 (L) 08/18/2022   K 4.3 08/18/2022   CO2 32 08/18/2022   GLUCOSE 132 (H) 08/18/2022   BUN 26 (H) 08/18/2022   CREATININE 1.03 (H) 08/18/2022   CALCIUM 8.8 (L) 08/18/2022   GFRNONAA 55 (L) 08/18/2022    BNP (last 3 results) Recent Labs    08/13/22 1619  BNP 164.2*    ProBNP (last 3 results) Recent Labs    12/11/21 1456  PROBNP  143.0*      Latest Ref Rng & Units 08/18/2022    3:58 AM 08/17/2022    2:51 AM 08/16/2022    2:51 AM  BMP  Glucose 70 - 99 mg/dL 132  98  115   BUN 8 - 23 mg/dL 26  30  38   Creatinine 0.44 - 1.00 mg/dL 1.03  1.12  1.28   Sodium 135 - 145 mmol/L 134  137  138   Potassium 3.5 - 5.1 mmol/L 4.3  4.9  5.2   Chloride 98 - 111 mmol/L 94  97  98   CO2 22 - 32 mmol/L 32  32  29   Calcium 8.9 - 10.3 mg/dL 8.8  8.9  9.4       Latest Ref Rng & Units 08/15/2022    8:06 AM 08/13/2022    3:41 PM 08/03/2022    3:38 PM  Hepatic Function  Total Protein 6.5 - 8.1 g/dL 7.2  7.1  7.9   Albumin 3.5 - 5.0 g/dL 3.0  3.0  3.9   AST 15 - 41 U/L 33  36  24   ALT 0 - 44 U/L 23  19  13   $ Alk Phosphatase 38 - 126 U/L 49  48  46   Total Bilirubin 0.3 - 1.2 mg/dL 0.5  0.4  0.6  Bilirubin, Direct 0.0 - 0.3 mg/dL   0.2       Latest Ref Rng & Units 08/18/2022    3:58 AM 08/17/2022    2:51 AM 08/16/2022    2:51 AM  CBC  WBC 4.0 - 10.5 K/uL 11.9  12.9  14.9   Hemoglobin 12.0 - 15.0 g/dL 10.5  10.9  11.1   Hematocrit 36.0 - 46.0 % 32.6  34.8  34.7   Platelets 150 - 400 K/uL 194  189  230    Lipid Panel     Component Value Date/Time   CHOL 149 07/05/2022 1016   TRIG 77.0 07/05/2022 1016   HDL 55.90 07/05/2022 1016   CHOLHDL 3 07/05/2022 1016   VLDL 15.4 07/05/2022 1016   LDLCALC 78 07/05/2022 1016   HEMOGLOBIN A1C Lab Results  Component Value Date   HGBA1C 5.8 07/05/2022   TSH Recent Labs    09/22/21 0939 07/05/22 1016  TSH 2.44 2.17   Imaging: CXR 08/13/2022: Rotated AP portable examination. Cardiomegaly with diffuse bilateral interstitial pulmonary opacity, likely edema. No focal airspace opacity.  Cardiac Studies:  EKG: EKG 08/13/2022: Atrial fibrillation with rapid ventricular response at the rate of 125 bpm, normal axis, diffuse nonspecific T abnormality.  Low-voltage complexes.  Echocardiogram 02/18/2020:   1. Left ventricular ejection fraction, by estimation, is 45 to 50%. The left  ventricle has mildly decreased function. The left ventricle demonstrates global hypokinesis. Left ventricular diastolic parameters are indeterminate.  2. Right ventricular systolic function is normal. The right ventricular size is normal. There is mildly elevated pulmonary artery systolic pressure. The estimated right ventricular systolic pressure is 99991111 mmHg.  3. Left atrial size was mildly dilated.  4. The mitral valve is normal in structure. No evidence of mitral valve regurgitation. No evidence of mitral stenosis.  5. The aortic valve is tricuspid. Aortic valve regurgitation is not visualized. Mild aortic valve sclerosis is present, with no evidence of aortic valve stenosis.  6. The inferior vena cava is normal in size with <50% respiratory variability, suggesting right atrial pressure of 8 mmHg.  7. The patient appeared to be in atrial fibrillation with mild RVR.   Scheduled Meds:  amitriptyline  50 mg Oral QHS   apixaban  5 mg Oral BID   citalopram  10 mg Oral Daily   diltiazem  90 mg Oral Q12H   metoprolol succinate  75 mg Oral Daily   pantoprazole  40 mg Oral Daily   Continuous Infusions:  diltiazem (CARDIZEM) infusion 5 mg/hr (08/17/22 2222)   meropenem (MERREM) IV 200 mL/hr at 08/17/22 2222   PRN Meds:.acetaminophen, albuterol, ondansetron (ZOFRAN) IV, mouth rinse  Assessment  Kathy [name] is a 82 y.o. female patient with what appears like IPF, dementia, admitted to the hospital with admitted with UTI, CAP, altered mental status.  Cardiology consulted for management of A-fib.     Patient has functional and cognitive decline since Thanksgiving 2023.  Today during the exam she appeared to be confused as well.  I think she would be  Persistent atrial fibrillation.  Patient presently on apixaban.  Continue metoprolol and also diltiazem, proceed with direct-current cardioversion, TEE guided.  Indications include the patient's heart rate is not well-controlled.  Minimal activity even  in the bed heart rate jumps up to 100-110 bpm.  Patient's family has been updated yesterday with regard to plan for direct-current cardioversion and TEE.  Suspect she probably will need some form of antiarrhythmic therapy, in view of IPF, amiodarone  may not be a good choice, consider Multaq or could consider low-dose sotalol at 40 mg twice daily and discontinue metoprolol after cardioversion.    Adrian Prows, MD, [facility] 08/18/2022, 7:17 AM Office: [phone] Fax: [phone] Pager: [phone]

## 2022-08-18 NOTE — Progress Notes (Signed)
Mobility Specialist Progress Note:   08/18/22 1035  Mobility  Activity Transferred to/from [facility]  Level of Assistance Contact guard assist, steadying assist  Assistive Device Front wheel walker  Distance Ambulated (ft) 3 ft  Activity Response Tolerated well  Mobility Referral Yes  $Mobility charge 1 Mobility   Pt needing to transfer to [facility] to void. Required minG assist throughout transfer. Pt performed x3 STS from different surface heights, all with minG. Pericare performed, pt left in care of OT for therapy session.   Nelta Numbers Mobility Specialist Please contact via SecureChat or  Rehab office at [phone]

## 2022-08-19 DIAGNOSIS — I4891 Unspecified atrial fibrillation: Secondary | ICD-10-CM | POA: Diagnosis not present

## 2022-08-19 LAB — BASIC METABOLIC PANEL
Anion gap: 6 (ref 5–15)
BUN: 21 mg/dL (ref 8–23)
CO2: 35 mmol/L — ABNORMAL HIGH (ref 22–32)
Calcium: 8.7 mg/dL — ABNORMAL LOW (ref 8.9–10.3)
Chloride: 97 mmol/L — ABNORMAL LOW (ref 98–111)
Creatinine, Ser: 1.01 mg/dL — ABNORMAL HIGH (ref 0.44–1.00)
GFR, Estimated: 56 mL/min — ABNORMAL LOW (ref >60)
Glucose, Bld: 117 mg/dL — ABNORMAL HIGH (ref 70–99)
Potassium: 4.3 mmol/L (ref 3.5–5.1)
Sodium: 138 mmol/L (ref 135–145)

## 2022-08-19 LAB — CBC
HCT: 29.8 % — ABNORMAL LOW (ref 36.0–46.0)
Hemoglobin: 9.9 g/dL — ABNORMAL LOW (ref 12.0–15.0)
MCH: 33.8 pg (ref 26.0–34.0)
MCHC: 33.2 g/dL (ref 30.0–36.0)
MCV: 101.7 fL — ABNORMAL HIGH (ref 80.0–100.0)
Platelets: 188 10*3/uL (ref 150–400)
RBC: 2.93 MIL/uL — ABNORMAL LOW (ref 3.87–5.11)
RDW: 12.9 % (ref 11.5–15.5)
WBC: 9.8 10*3/uL (ref 4.0–10.5)
nRBC: 0 % (ref 0.0–0.2)

## 2022-08-19 LAB — PHOSPHORUS: Phosphorus: 3.1 mg/dL (ref 2.5–4.6)

## 2022-08-19 LAB — RESP PANEL BY RT-PCR (RSV, FLU A&B, COVID)  RVPGX2
Influenza A by PCR: NEGATIVE
Influenza B by PCR: NEGATIVE
Resp Syncytial Virus by PCR: NEGATIVE
SARS Coronavirus 2 by RT PCR: NEGATIVE

## 2022-08-19 LAB — MAGNESIUM: Magnesium: 2.1 mg/dL (ref 1.7–2.4)

## 2022-08-19 NOTE — Anesthesia Postprocedure Evaluation (Signed)
Anesthesia Post Note  Patient: Kathy [name]  Procedure(s) Performed: TRANSESOPHAGEAL ECHOCARDIOGRAM (TEE) CARDIOVERSION BUBBLE STUDY     Patient location during evaluation: PACU Anesthesia Type: MAC Level of consciousness: awake and alert Pain management: pain level controlled Vital Signs Assessment: post-procedure vital signs reviewed and stable Respiratory status: spontaneous breathing Cardiovascular status: stable Anesthetic complications: no   No notable events documented.  Last Vitals:  Vitals:   08/19/22 1042 08/19/22 1504  BP: (!) 98/58 125/64  Pulse: 62 60  Resp: 17 19  Temp: 36.6 C 36.9 C  SpO2: 97% 98%    Last Pain:  Vitals:   08/19/22 1504  TempSrc: Oral  PainSc:                  Nolon Nations

## 2022-08-19 NOTE — Progress Notes (Signed)
Physical Therapy Treatment Patient Details Name: Kathy [name] MRN: [id number] DOB: April 16, 1941 Today's Date: 08/19/2022   History of Present Illness 82 year old female admitted with AMS, SOB, Afib and increased cough.  Work up reveals community acquired PNA.  PMH significant for history of PAF, ILD with chronic hypoxic respiratory failure on 2-3 L at home, recurrent UTIs.  Daughter reports that she initially started to be confused around last Thanksgiving,  She has chronic knee problems and had steroid injection in to the knees couple days ago.    PT Comments    Pt received in bed. Reports fatigue after just getting back to bed after using BSC. Pt performed BLE exercises in bed prior to mobilizing OOB to recliner. She required mod assist supine to sit, mod assist sit to stand, and min assist amb 5' with RW. Pt in recliner with feet elevated at end of session.    Recommendations for follow up therapy are one component of a multi-disciplinary discharge planning process, led by the attending physician.  Recommendations may be updated based on patient status, additional functional criteria and insurance authorization.  Follow Up Recommendations  Skilled nursing-short term rehab (<3 hours/day) Can patient physically be transported by private vehicle: No   Assistance Recommended at Discharge Frequent or constant Supervision/Assistance  Patient can return home with the following A lot of help with walking and/or transfers;A lot of help with bathing/dressing/bathroom;Assistance with cooking/housework;Direct supervision/assist for medications management;Direct supervision/assist for financial management   Equipment Recommendations  None recommended by PT    Recommendations for Other Services       Precautions / Restrictions Precautions Precautions: Fall;Other (comment) Precaution Comments: watch 02     Mobility  Bed Mobility Overal bed mobility: Needs Assistance Bed Mobility: Supine to Sit      Supine to sit: Mod assist, HOB elevated     General bed mobility comments: assist with BLE and trunk, +rail, increased time, use of bed pad to scoot to EOB    Transfers Overall transfer level: Needs assistance Equipment used: Rolling walker (2 wheels) Transfers: Sit to/from Stand Sit to Stand: Mod assist           General transfer comment: cues for hand placement and sequencing, assist to power up    Ambulation/Gait Ambulation/Gait assistance: Min assist Gait Distance (Feet): 5 Feet Assistive device: Rolling walker (2 wheels) Gait Pattern/deviations: Decreased stride length, Step-through pattern, Trunk flexed Gait velocity: decreased     General Gait Details: amb bed to recliner with RW   Stairs             Wheelchair Mobility    Modified Rankin (Stroke Patients Only)       Balance Overall balance assessment: Needs assistance Sitting-balance support: Feet supported, No upper extremity supported Sitting balance-Leahy Scale: Fair     Standing balance support: During functional activity, Bilateral upper extremity supported, Reliant on assistive device for balance Standing balance-Leahy Scale: Poor                              Cognition Arousal/Alertness: Awake/alert Behavior During Therapy: Flat affect Overall Cognitive Status: Impaired/Different from baseline Area of Impairment: Following commands, Safety/judgement, Awareness, Problem solving, Memory, Attention                   Current Attention Level: Selective Memory: Decreased short-term memory Following Commands: Follows one step commands with increased time Safety/Judgement: Decreased awareness of safety, Decreased awareness of deficits Awareness:  Emergent Problem Solving: Difficulty sequencing, Requires verbal cues, Requires tactile cues, Slow processing          Exercises General Exercises - Lower Extremity Ankle Circles/Pumps: AROM, Both, 10 reps Gluteal Sets:  AROM, Both, 10 reps Heel Slides: AROM, Right, Left, 5 reps Hip ABduction/ADduction: AROM, Right, Left, 10 reps    General Comments General comments (skin integrity, edema, etc.): Pt on 3L continuous O2      Pertinent Vitals/Pain Pain Assessment Pain Assessment: Faces Faces Pain Scale: Hurts little more Pain Location: bilat knees with mobility Pain Descriptors / Indicators: Discomfort, Grimacing Pain Intervention(s): Monitored during session, Repositioned, Limited activity within patient's tolerance    Home Living                          Prior Function            PT Goals (current goals can now be found in the care plan section) Acute Rehab PT Goals Patient Stated Goal: home Progress towards PT goals: Progressing toward goals    Frequency    Min 2X/week      PT Plan Frequency needs to be updated    Co-evaluation              AM-PAC PT "6 Clicks" Mobility   Outcome Measure  Help needed turning from your back to your side while in a flat bed without using bedrails?: A Lot Help needed moving from lying on your back to sitting on the side of a flat bed without using bedrails?: A Lot Help needed moving to and from a bed to a chair (including a wheelchair)?: A Lot Help needed standing up from a chair using your arms (e.g., wheelchair or bedside chair)?: A Lot Help needed to walk in hospital room?: A Little Help needed climbing 3-5 steps with a railing? : Total 6 Click Score: 12    End of Session Equipment Utilized During Treatment: Gait belt;Oxygen Activity Tolerance: Patient tolerated treatment well Patient left: in chair;with call bell/phone within reach;with family/visitor present Nurse Communication: Mobility status PT Visit Diagnosis: Other abnormalities of gait and mobility (R26.89);Muscle weakness (generalized) (M62.81);History of falling (Z91.81);Difficulty in walking, not elsewhere classified (R26.2)     Time: [id number] PT Time  Calculation (min) (ACUTE ONLY): 25 min  Charges:  $Gait Training: 8-22 mins $Therapeutic Exercise: 8-22 mins                     Gloriann Loan., PT  Office # [phone]    Lorriane Shire 08/19/2022, 12:20 PM

## 2022-08-19 NOTE — TOC Progression Note (Signed)
Transition of Care [facility]) - Progression Note    Patient Details  Name: Kathy [name] MRN: [id number] Date of Birth: Feb 10, 1941  Transition of Care [facility] CM/SW [place] [place] Phone Number: 08/19/2022, 12:31 PM  Clinical Narrative:     Patient has SNF bed at [facility] when medically ready for dc. CSW will continue to follow and assist with patients dc planning needs.  Expected Discharge Plan: [place] Barriers to Discharge: Continued Medical Work up  Expected Discharge Plan and Services In-house Referral: Clinical Social Work     Living arrangements for the past 2 months: Single Family Home                                       Social Determinants of Health (SDOH) Interventions SDOH Screenings   Food Insecurity: No Food Insecurity (08/14/2022)  Housing: Low Risk  (08/14/2022)  Transportation Needs: No Transportation Needs (08/14/2022)  Utilities: Not At Risk (08/14/2022)  Alcohol Screen: Low Risk  (02/09/2022)  Depression (PHQ2-9): Low Risk  (06/29/2022)  Financial Resource Strain: Low Risk  (02/09/2022)  Physical Activity: Inactive (02/09/2022)  Social Connections: Moderately Isolated (02/09/2022)  Stress: No Stress Concern Present (02/09/2022)  Tobacco Use: Medium Risk (08/18/2022)    Readmission Risk Interventions     No data to display

## 2022-08-19 NOTE — Progress Notes (Addendum)
Subjective:  Patient seen and examined at bedside, sitting up in chair and eating. She is feeling well. No concerns or complaints today.  Intake/Output from previous day:  I/O last 3 completed shifts: In: 802.7 [P.O.:720; I.V.:82.7] Out: -  No intake/output data recorded. Net IO Since Admission: 2,251.09 mL [08/19/22 2026]  Blood pressure 125/64, pulse 60, temperature 98.5 F (36.9 C), temperature source Oral, resp. rate 19, height 5' (1.524 m), weight 102.1 kg, SpO2 98 %. Physical Exam Constitutional:      General: She is sleeping.     Appearance: She is morbidly obese.  Neck:     Vascular: No carotid bruit or JVD.  Cardiovascular:     Rate and Rhythm: Normal rate and regular rhythm.     Pulses: Intact distal pulses.     Heart sounds: Normal heart sounds. No murmur heard.    No gallop.  Pulmonary:     Effort: Pulmonary effort is normal.     Breath sounds: Examination of the right-lower field reveals decreased breath sounds. Examination of the left-lower field reveals decreased breath sounds. Decreased breath sounds present.  Abdominal:     General: Bowel sounds are normal.     Palpations: Abdomen is soft.  Musculoskeletal:     Right lower leg: No edema.     Left lower leg: No edema.  Neurological:     Mental Status: She is easily aroused.     Lab Results: Lab Results  Component Value Date   NA 138 08/19/2022   K 4.3 08/19/2022   CO2 35 (H) 08/19/2022   GLUCOSE 117 (H) 08/19/2022   BUN 21 08/19/2022   CREATININE 1.01 (H) 08/19/2022   CALCIUM 8.7 (L) 08/19/2022   GFRNONAA 56 (L) 08/19/2022    BNP (last 3 results) Recent Labs    08/13/22 1619  BNP 164.2*    ProBNP (last 3 results) Recent Labs    12/11/21 1456  PROBNP 143.0*      Latest Ref Rng & Units 08/19/2022    3:17 AM 08/18/2022    3:58 AM 08/17/2022    2:51 AM  BMP  Glucose 70 - 99 mg/dL 117  132  98   BUN 8 - 23 mg/dL 21  26  30   $ Creatinine 0.44 - 1.00 mg/dL 1.01  1.03  1.12   Sodium 135 -  145 mmol/L 138  134  137   Potassium 3.5 - 5.1 mmol/L 4.3  4.3  4.9   Chloride 98 - 111 mmol/L 97  94  97   CO2 22 - 32 mmol/L 35  32  32   Calcium 8.9 - 10.3 mg/dL 8.7  8.8  8.9       Latest Ref Rng & Units 08/15/2022    8:06 AM 08/13/2022    3:41 PM 08/03/2022    3:38 PM  Hepatic Function  Total Protein 6.5 - 8.1 g/dL 7.2  7.1  7.9   Albumin 3.5 - 5.0 g/dL 3.0  3.0  3.9   AST 15 - 41 U/L 33  36  24   ALT 0 - 44 U/L 23  19  13   $ Alk Phosphatase 38 - 126 U/L 49  48  46   Total Bilirubin 0.3 - 1.2 mg/dL 0.5  0.4  0.6   Bilirubin, Direct 0.0 - 0.3 mg/dL   0.2       Latest Ref Rng & Units 08/19/2022    3:17 AM 08/18/2022    3:58 AM 08/17/2022  2:51 AM  CBC  WBC 4.0 - 10.5 K/uL 9.8  11.9  12.9   Hemoglobin 12.0 - 15.0 g/dL 9.9  10.5  10.9   Hematocrit 36.0 - 46.0 % 29.8  32.6  34.8   Platelets 150 - 400 K/uL 188  194  189    Lipid Panel     Component Value Date/Time   CHOL 149 07/05/2022 1016   TRIG 77.0 07/05/2022 1016   HDL 55.90 07/05/2022 1016   CHOLHDL 3 07/05/2022 1016   VLDL 15.4 07/05/2022 1016   LDLCALC 78 07/05/2022 1016   HEMOGLOBIN A1C Lab Results  Component Value Date   HGBA1C 5.8 07/05/2022   TSH Recent Labs    09/22/21 0939 07/05/22 1016  TSH 2.44 2.17   Imaging: CXR 08/13/2022: Rotated AP portable examination. Cardiomegaly with diffuse bilateral interstitial pulmonary opacity, likely edema. No focal airspace opacity.  Cardiac Studies:  EKG: EKG 08/13/2022: Atrial fibrillation with rapid ventricular response at the rate of 125 bpm, normal axis, diffuse nonspecific T abnormality.  Low-voltage complexes.  Tele 08/19/2022: normal sinus rhythm, rate low 60s  Echocardiogram 02/18/2020:   1. Left ventricular ejection fraction, by estimation, is 45 to 50%. The left ventricle has mildly decreased function. The left ventricle demonstrates global hypokinesis. Left ventricular diastolic parameters are indeterminate.  2. Right ventricular systolic function is  normal. The right ventricular size is normal. There is mildly elevated pulmonary artery systolic pressure. The estimated right ventricular systolic pressure is 99991111 mmHg.  3. Left atrial size was mildly dilated.  4. The mitral valve is normal in structure. No evidence of mitral valve regurgitation. No evidence of mitral stenosis.  5. The aortic valve is tricuspid. Aortic valve regurgitation is not visualized. Mild aortic valve sclerosis is present, with no evidence of aortic valve stenosis.  6. The inferior vena cava is normal in size with <50% respiratory variability, suggesting right atrial pressure of 8 mmHg.  7. The patient appeared to be in atrial fibrillation with mild RVR.   Scheduled Meds:  amitriptyline  50 mg Oral QHS   apixaban  5 mg Oral BID   citalopram  10 mg Oral Daily   diltiazem  90 mg Oral Q12H   pantoprazole  40 mg Oral Daily   sotalol  40 mg Oral Q12H   Continuous Infusions:  diltiazem (CARDIZEM) infusion Stopped (08/18/22 1916)   PRN Meds:.acetaminophen, albuterol, ondansetron (ZOFRAN) IV, mouth rinse  Assessment  Kathy [name] is a 82 y.o. female patient with what appears like IPF, dementia, admitted to the hospital with admitted with UTI, CAP, altered mental status.  Cardiology consulted for management of A-fib.     Patient has functional and cognitive decline since Thanksgiving 2023.  Suspect advancing dementia. She does not remember any of her doctor's names.  Persistent atrial fibrillation s/p TEE with DCCV, now in NSR Continue apixaban.   Continue sotolol and also diltiazem.      Floydene Flock, DO, [facility] 08/19/2022, 8:26 PM Office: [phone] Fax: [phone] Pager: [phone]

## 2022-08-19 NOTE — Progress Notes (Signed)
Mobility Specialist - Progress Note   08/19/22 1603  Mobility  Activity Ambulated with assistance to bathroom  Level of Assistance Minimal assist, patient does 75% or more  Assistive Device Front wheel walker  Distance Ambulated (ft) 10 ft  Activity Response Tolerated well  Mobility Referral Yes  $Mobility charge 1 Mobility   Pt was received in chair needing assistance to BR. Pt was MinA to stand and CG throughout ambulation. Pt left on commode with all needs met and instructed to use call bell when finished.   Kathy [name]  Mobility Specialist Please contact via Solicitor or Rehab office at [phone]

## 2022-08-19 NOTE — Plan of Care (Signed)

## 2022-08-19 NOTE — Progress Notes (Signed)
   08/19/22 1049  Spiritual Encounters  Type of Visit Initial  Care provided to: Patient;Family  Referral source Other (comment)  Reason for visit Routine spiritual support  Spiritual Framework  Presenting Themes Meaning/purpose/sources of inspiration;Impactful experiences and emotions  Community/Connection Family  Strengths Family support system  Patient Stress Factors Health changes  Family Stress Factors Not reviewed  Interventions  Spiritual Care Interventions Made Established relationship of care and support;Compassionate presence;Reflective listening;Narrative/life review;Mindfulness intervention;Encouragement;Other (comment)  Intervention Outcomes  Outcomes Connection to spiritual care  Spiritual Care Plan  Spiritual Care Issues Still Outstanding No further spiritual care needs at this time (see row info)   Received call to visit patient for adv dir however the patient was not interested and stated she thinks it was her daughter's idea. Patient's husband was present. Patient appeared to be able to make and/or direct her wants and needs. Discussed family dynamics and small talk. Patient was alert and interacted in conversation. Spoke clearly and appeared focused.

## 2022-08-19 NOTE — Progress Notes (Signed)
PROGRESS NOTE    Kelsei Chadick  [id number] DOB: [birth year] DOA: 08/13/2022 PCP: Biagio Borg, MD  Chief Complaint  Patient presents with   Altered Mental Status    Brief Narrative:   82 year old female with history of PAF on Eliquis, CKD 3A with baseline creatinine 1.2-1.4, ILD with chronic hypoxic respiratory failure on 2-3 L at home, recurrent UTIs who comes to the hospital with altered mental status.  Daughter tells me that she initially started to be confused around last Thanksgiving, was diagnosed with Nirvan Laban UTI and her mental status cleared with treatment.  Had recurrent UTI in January which was treated, microbiology showed ESBL E. coli.  She has been having intermittent confusion since, more so in the last few days, and she was brought to the ER.  Daughter also reports worsening shortness of breath and increased cough in the last few days.  She has chronic knee problems and had steroid injection in to the knees couple days ago.  In the ED she was found to be in Generoso Cropper-fib with RVR and started on Cardizem drip.  Due to poorly controlled rates, cardiology was consulted   Assessment & Plan:   Principal Problem:   Atrial fibrillation with RVR ([place]) Active Problems:   Community acquired pneumonia   UTI due to extended-spectrum beta lactamase (ESBL) producing Escherichia coli   Essential hypertension   ILD (interstitial lung disease) (HCC)   CKD (chronic kidney disease) stage 3, GFR 30-59 ml/min (HCC)   CAP (community acquired pneumonia)   Acute metabolic encephalopathy, likely due to community-acquired pneumonia -patient's imaging shows suspicious for right upper lobe pneumonia.  She is symptomatic with cough, congestion, and has Amariz Flamenco significant leukocytosis.  Has been placed on broad-spectrum antibiotics, continue for now, plan for total of 5 days with last day today.  White count improving.  Monitor blood cultures, negative so far "Suspect she may have some developing underlying dementia." -  per prior hospitalist.   Will need further w/u outpatient.   Athea Haley-fib with RVR - now s/p TEE cardioversion with cards Continue eliquis, metoprolol, dilt per cards Sotalol per cards  Recent ESBL E. coli UTI -urinalysis now clean.  Getting treated for above with carbapenem.   Hyperkalemia- resolved   Chronic kidney disease stage IIIa- appears at baseline   ILD, chronic hypoxic respiratory failure -respiratory status appears close to baseline   Essential hypertension-resume metoprolol, diltiazem.  Hold ARB   Sore Throat Negative covid, influenza, RSV Likely related to TEE?    DVT prophylaxis: eliquis Code Status: full Family Communication: husband Disposition:   Status is: Inpatient Remains inpatient appropriate because: pending further improvemetn   Consultants:  cardiology  Procedures:  TEE Cardioversion IMPRESSIONS     1. Left ventricular ejection fraction, by estimation, is 50 to 55%. The  left ventricle has low normal function. The left ventricle has no regional  wall motion abnormalities.   2. Right ventricular systolic function is normal. The right ventricular  size is normal.   3. Left atrial size was moderately dilated. No left atrial/left atrial  appendage thrombus was detected. The LAA emptying velocity was 26 cm/s.   4. Karcyn Menn small pericardial effusion is present. The pericardial effusion is  anterior to the right ventricle. There is no evidence of cardiac  tamponade.   5. The mitral valve is degenerative. Mild mitral valve regurgitation. No  evidence of mitral stenosis.   6. The aortic valve is tricuspid. Aortic valve regurgitation is not  visualized. Aortic valve sclerosis is  present, with no evidence of aortic  valve stenosis.   7. There is mild (Grade II) plaque involving the descending aorta and  ascending aorta.   8. Small PFO cannot be ruled out per color doppler. Agitated saline  contrast bubble study was negative, with no evidence of any interatrial   shunt.   9. Rhythm strip during this exam demonstrates atrial fibrillation.   Comparison(s): TTE from 02/18/2020: LVEF 45-50%, indeterminate diastolic  parameter, RVSP 36.35mHG, mild LAE, estimated RAP 891mG.   Conclusion(s)/Recommendation(s): No LA/LAA thrombus identified. Successful  cardioversion performed with restoration of normal sinus rhythm.    Antimicrobials:  Anti-infectives (From admission, onward)    Start     Dose/Rate Route Frequency Ordered Stop   08/18/22 0400  azithromycin (ZITHROMAX) 500 mg in sodium chloride 0.9 % 250 mL IVPB        500 mg 250 mL/hr over 60 Minutes Intravenous Every 24 hours 08/17/22 0941 08/18/22 0518   08/17/22 2200  meropenem (MERREM) 1 g in sodium chloride 0.9 % 100 mL IVPB        1 g 200 mL/hr over 30 Minutes Intravenous Every 12 hours 08/17/22 0941 08/18/22 0947   08/14/22 0400  azithromycin (ZITHROMAX) 500 mg in sodium chloride 0.9 % 250 mL IVPB  Status:  Discontinued        500 mg 250 mL/hr over 60 Minutes Intravenous Every 24 hours 08/14/22 0306 08/17/22 0941   08/13/22 2115  meropenem (MERREM) 1 g in sodium chloride 0.9 % 100 mL IVPB  Status:  Discontinued        1 g 200 mL/hr over 30 Minutes Intravenous Every 12 hours 08/13/22 2108 08/17/22 0941       Subjective: No new complaints other than sore throat  Objective: Vitals:   08/18/22 2350 08/19/22 0349 08/19/22 1042 08/19/22 1504  BP: 135/60 132/61 (!) 98/58 125/64  Pulse: 61 68 62 60  Resp: 18 20 17 19  $ Temp: 98.2 F (36.8 C) 98.1 F (36.7 C) 97.8 F (36.6 C) 98.5 F (36.9 C)  TempSrc: Oral Oral Oral Oral  SpO2: 99% 98% 97% 98%  Weight:      Height:        Intake/Output Summary (Last 24 hours) at 08/19/2022 1751 Last data filed at 08/19/2022 0800 Gross per 24 hour  Intake 360 ml  Output --  Net 360 ml   Filed Weights   08/13/22 1538  Weight: 102.1 kg    Examination:  General: No acute distress. Cardiovascular: RRR Lungs: Clear to auscultation bilaterally   Abdomen: Soft, nontender, nondistended  Neurological: Alert.  Moving all extremities. Extremities: No clubbing or cyanosis. No edema.  Data Reviewed: I have personally reviewed following labs and imaging studies  CBC: Recent Labs  Lab 08/15/22 1313 08/16/22 0251 08/17/22 0251 08/18/22 0358 08/19/22 0317  WBC 18.5* 14.9* 12.9* 11.9* 9.8  HGB 11.2* 11.1* 10.9* 10.5* 9.9*  HCT 35.3* 34.7* 34.8* 32.6* 29.8*  MCV 101.1* 101.5* 103.3* 100.6* 101.7*  PLT 179 230 189 194 [id number]  Basic Metabolic Panel: Recent Labs  Lab 08/15/22 0806 08/16/22 0251 08/17/22 0251 08/18/22 0358 08/19/22 0317  NA 135 138 137 134* 138  K 4.8 5.2* 4.9 4.3 4.3  CL 99 98 97* 94* 97*  CO2 26 29 32 32 35*  GLUCOSE 111* 115* 98 132* 117*  BUN 35* 38* 30* 26* 21  CREATININE 1.40* 1.28* 1.12* 1.03* 1.01*  CALCIUM 9.3 9.4 8.9 8.8* 8.7*  MG 2.2 2.3 2.1  2.1 2.1  PHOS  --   --   --   --  3.1    GFR: Estimated Creatinine Clearance: 47 mL/min (Shaunie Boehm) (by C-G formula based on SCr of 1.01 mg/dL (H)).  Liver Function Tests: Recent Labs  Lab 08/13/22 1541 08/15/22 0806  AST 36 33  ALT 19 23  ALKPHOS 48 49  BILITOT 0.4 0.5  PROT 7.1 7.2  ALBUMIN 3.0* 3.0*    CBG: Recent Labs  Lab 08/13/22 1609  GLUCAP 242*     Recent Results (from the past 240 hour(s))  Resp panel by RT-PCR (RSV, Flu Gennaro Lizotte&B, Covid) Anterior Nasal Swab     Status: None   Collection Time: 08/13/22  4:19 PM   Specimen: Anterior Nasal Swab  Result Value Ref Range Status   SARS Coronavirus 2 by RT PCR NEGATIVE NEGATIVE Final   Influenza Clarisa Danser by PCR NEGATIVE NEGATIVE Final   Influenza B by PCR NEGATIVE NEGATIVE Final    Comment: (NOTE) The Xpert Xpress SARS-CoV-2/FLU/RSV plus assay is intended as an aid in the diagnosis of influenza from Nasopharyngeal swab specimens and should not be used as Braylon Grenda sole basis for treatment. Nasal washings and aspirates are unacceptable for Xpert Xpress SARS-CoV-2/FLU/RSV testing.  Fact Sheet for  Patients: EntrepreneurPulse.com.au  Fact Sheet for Healthcare Providers: IncredibleEmployment.be  This test is not yet approved or cleared by the [place] FDA and has been authorized for detection and/or diagnosis of SARS-CoV-2 by FDA under an Emergency Use Authorization (EUA). This EUA will remain in effect (meaning this test can be used) for the duration of the COVID-19 declaration under Section 564(b)(1) of the Act, 21 U.S.C. section 360bbb-3(b)(1), unless the authorization is terminated or revoked.     Resp Syncytial Virus by PCR NEGATIVE NEGATIVE Final    Comment: (NOTE) Fact Sheet for Patients: EntrepreneurPulse.com.au  Fact Sheet for Healthcare Providers: IncredibleEmployment.be  This test is not yet approved or cleared by the [place] FDA and has been authorized for detection and/or diagnosis of SARS-CoV-2 by FDA under an Emergency Use Authorization (EUA). This EUA will remain in effect (meaning this test can be used) for the duration of the COVID-19 declaration under Section 564(b)(1) of the Act, 21 U.S.C. section 360bbb-3(b)(1), unless the authorization is terminated or revoked.  Performed at [facility], [place] [address], [place], [place] [address]   Blood culture (routine x 2)     Status: None   Collection Time: 08/13/22  5:52 PM   Specimen: BLOOD  Result Value Ref Range Status   Specimen Description BLOOD LEFT ANTECUBITAL  Final   Special Requests   Final    BOTTLES DRAWN AEROBIC AND ANAEROBIC Blood Culture results may not be optimal due to an inadequate volume of blood received in culture bottles   Culture   Final    NO GROWTH 5 DAYS Performed at [facility], [place] [address], [place], [place] [address]    Report Status 08/18/2022 FINAL  Final  Blood culture (routine x 2)     Status: None   Collection Time: 08/13/22  5:57 PM   Specimen: BLOOD  Result Value Ref Range  Status   Specimen Description BLOOD RIGHT ANTECUBITAL  Final   Special Requests   Final    BOTTLES DRAWN AEROBIC AND ANAEROBIC Blood Culture results may not be optimal due to an inadequate volume of blood received in culture bottles   Culture   Final    NO GROWTH 5 DAYS Performed at  [facility], [place] [address], [place], [place] [address]    Report Status 08/18/2022 FINAL  Final  Resp panel by RT-PCR (RSV, Flu Helen Cuff&B, Covid) Anterior Nasal Swab     Status: None   Collection Time: 08/19/22  3:04 PM   Specimen: Anterior Nasal Swab  Result Value Ref Range Status   SARS Coronavirus 2 by RT PCR NEGATIVE NEGATIVE Final   Influenza Blu Mcglaun by PCR NEGATIVE NEGATIVE Final   Influenza B by PCR NEGATIVE NEGATIVE Final    Comment: (NOTE) The Xpert Xpress SARS-CoV-2/FLU/RSV plus assay is intended as an aid in the diagnosis of influenza from Nasopharyngeal swab specimens and should not be used as Jaleea Alesi sole basis for treatment. Nasal washings and aspirates are unacceptable for Xpert Xpress SARS-CoV-2/FLU/RSV testing.  Fact Sheet for Patients: EntrepreneurPulse.com.au  Fact Sheet for Healthcare Providers: IncredibleEmployment.be  This test is not yet approved or cleared by the [place] FDA and has been authorized for detection and/or diagnosis of SARS-CoV-2 by FDA under an Emergency Use Authorization (EUA). This EUA will remain in effect (meaning this test can be used) for the duration of the COVID-19 declaration under Section 564(b)(1) of the Act, 21 U.S.C. section 360bbb-3(b)(1), unless the authorization is terminated or revoked.     Resp Syncytial Virus by PCR NEGATIVE NEGATIVE Final    Comment: (NOTE) Fact Sheet for Patients: EntrepreneurPulse.com.au  Fact Sheet for Healthcare Providers: IncredibleEmployment.be  This test is not yet approved or cleared by the [place] FDA and has been authorized for detection  and/or diagnosis of SARS-CoV-2 by FDA under an Emergency Use Authorization (EUA). This EUA will remain in effect (meaning this test can be used) for the duration of the COVID-19 declaration under Section 564(b)(1) of the Act, 21 U.S.C. section 360bbb-3(b)(1), unless the authorization is terminated or revoked.  Performed at [facility], [place] [address] [place], [place] [address]          Radiology Studies: ECHO TEE  Result Date: 08/18/2022    TRANSESOPHOGEAL ECHO REPORT   Patient Name:   GLENDINE KEMERER Date of Exam: 08/18/2022 Medical Rec #:  [id number]  Height:       60.0 in Accession #:    [id number] Weight:       225.0 lb Date of Birth:  [birth year]  BSA:          1.962 m Patient Age:    56 years   BP:           137/61 mmHg Patient Gender: F          HR:           82 bpm. Exam Location:  Inpatient Procedure: Transesophageal Echo, Color Doppler, Cardiac Doppler and Saline            Contrast Bubble Study Indications:     I48.91* Unspecified atrial fibrillation  History:         Patient has prior history of Echocardiogram examinations, most                  recent 02/18/2020. Arrythmias:Atrial Fibrillation; Risk                  Factors:Hypertension, Dyslipidemia and Sleep Apnea.  Sonographer:     Raquel Sarna Senior RDCS Referring Phys:  [id number] Rex Kras Diagnosing Phys: Rex Kras DO PROCEDURE: After discussion of the risks and benefits of Analysa Nutting TEE, an informed consent was obtained from the patient. The transesophogeal probe was passed without  difficulty through the esophogus of the patient. Imaged were obtained with the patient in Alexios Keown supine position. Local oropharyngeal anesthetic was provided with viscous lidocaine. Sedation performed by different physician. The patient was monitored while under deep sedation. Anesthestetic sedation was provided intravenously by Anesthesiology: 153m of Propofol. Image quality was good. The patient's vital signs; including heart rate, blood pressure, and oxygen  saturation; remained stable throughout the procedure. Supplementary images were obtained from transthoracic windows as indicated to answer the clinical question. The patient developed no complications during the procedure. Taylah Dubiel successful direct current cardioversion was performed at 200 joules with 1 attempt.  IMPRESSIONS  1. Left ventricular ejection fraction, by estimation, is 50 to 55%. The left ventricle has low normal function. The left ventricle has no regional wall motion abnormalities.  2. Right ventricular systolic function is normal. The right ventricular size is normal.  3. Left atrial size was moderately dilated. No left atrial/left atrial appendage thrombus was detected. The LAA emptying velocity was 26 cm/s.  4. Juelz Whittenberg small pericardial effusion is present. The pericardial effusion is anterior to the right ventricle. There is no evidence of cardiac tamponade.  5. The mitral valve is degenerative. Mild mitral valve regurgitation. No evidence of mitral stenosis.  6. The aortic valve is tricuspid. Aortic valve regurgitation is not visualized. Aortic valve sclerosis is present, with no evidence of aortic valve stenosis.  7. There is mild (Grade II) plaque involving the descending aorta and ascending aorta.  8. Small PFO cannot be ruled out per color doppler. Agitated saline contrast bubble study was negative, with no evidence of any interatrial shunt.  9. Rhythm strip during this exam demonstrates atrial fibrillation. Comparison(s): TTE from 02/18/2020: LVEF 45-50%, indeterminate diastolic parameter, RVSP [id number] mild LAE, estimated RAP 841mG. Conclusion(s)/Recommendation(s): No LA/LAA thrombus identified. Successful cardioversion performed with restoration of normal sinus rhythm. FINDINGS  Left Ventricle: Left ventricular ejection fraction, by estimation, is 50 to 55%. The left ventricle has low normal function. The left ventricle has no regional wall motion abnormalities. The left ventricular internal cavity  size was normal in size. Right Ventricle: The right ventricular size is normal. No increase in right ventricular wall thickness. Right ventricular systolic function is normal. Left Atrium: Left atrial size was moderately dilated. Spontaneous echo contrast was present in the left atrium and left atrial appendage. No left atrial/left atrial appendage thrombus was detected. The LAA emptying velocity was 26 cm/s. Right Atrium: Right atrial size was normal in size. Pericardium: Raeli Wiens small pericardial effusion is present. The pericardial effusion is anterior to the right ventricle. There is no evidence of cardiac tamponade. Mitral Valve: The mitral valve is degenerative in appearance. Mild mitral valve regurgitation. No evidence of mitral valve stenosis. Tricuspid Valve: The tricuspid valve is normal in structure. Tricuspid valve regurgitation is mild . No evidence of tricuspid stenosis. Aortic Valve: The aortic valve is tricuspid. Aortic valve regurgitation is not visualized. Aortic valve sclerosis is present, with no evidence of aortic valve stenosis. Pulmonic Valve: The pulmonic valve was grossly normal. Pulmonic valve regurgitation is mild. No evidence of pulmonic stenosis. Aorta: The aortic root and ascending aorta are structurally normal, with no evidence of dilitation. There is mild (Grade II) plaque involving the descending aorta and ascending aorta. IAS/Shunts: The interatrial septum appears to be lipomatous. Small PFO cannot be ruled out per color doppler. Agitated saline contrast was given intravenously to evaluate for intracardiac shunting. Agitated saline contrast bubble study was negative, with  no evidence of any interatrial shunt.  EKG: Rhythm strip during this exam demonstrates atrial fibrillation. Additional Comments: Spectral Doppler performed. LEFT VENTRICLE PLAX 2D LVOT diam:     2.10 cm LVOT Area:     3.46 cm  LV Volumes (MOD) LV vol d, MOD A4C: 104.0 ml LV vol s, MOD A4C: 50.6 ml LV SV MOD A4C:      104.0 ml  SHUNTS Systemic Diam: 2.10 cm Sunit Tolia DO Electronically signed by Rex Kras DO Signature Date/Time: 08/18/2022/5:14:45 PM    Final         Scheduled Meds:  amitriptyline  50 mg Oral QHS   apixaban  5 mg Oral BID   citalopram  10 mg Oral Daily   diltiazem  90 mg Oral Q12H   pantoprazole  40 mg Oral Daily   sotalol  40 mg Oral Q12H   Continuous Infusions:  diltiazem (CARDIZEM) infusion Stopped (08/18/22 1916)     LOS: 5 days    Time spent: over 30 min    Fayrene Helper, MD [facility]   To contact the attending provider between 7A-7P or the covering provider during after hours 7P-7A, please log into the web site [email or web address] and access using universal [place] password for that web site. If you do not have the password, please call the hospital operator.  08/19/2022, 5:51 PM

## 2022-08-19 NOTE — Progress Notes (Signed)
Pharmacy Consult for Sotalol Electrolyte Replacement- Follow Up  Pharmacy consulted to assist in monitoring and replacing electrolytes in this 82 y.o. female admitted on 08/13/2022 undergoing sotalol initiation .   Labs:    Component Value Date/Time   K 4.3 08/19/2022 0317   MG 2.1 08/19/2022 [id number]     Plan: Potassium: K >/= 4: No additional supplementation needed  Magnesium: Mg > 2: No additional supplementation needed  Hildred Laser, PharmD Clinical Pharmacist **Pharmacist phone directory can now be found on [email or web address] ([email or web address]).  Listed under [place].

## 2022-08-20 ENCOUNTER — Inpatient Hospital Stay (HOSPITAL_COMMUNITY): Payer: Medicare Other

## 2022-08-20 ENCOUNTER — Other Ambulatory Visit (HOSPITAL_COMMUNITY): Payer: Self-pay

## 2022-08-20 DIAGNOSIS — I4891 Unspecified atrial fibrillation: Secondary | ICD-10-CM | POA: Diagnosis not present

## 2022-08-20 LAB — COMPREHENSIVE METABOLIC PANEL
ALT: 20 U/L (ref 0–44)
AST: 23 U/L (ref 15–41)
Albumin: 2.4 g/dL — ABNORMAL LOW (ref 3.5–5.0)
Alkaline Phosphatase: 41 U/L (ref 38–126)
Anion gap: 8 (ref 5–15)
BUN: 21 mg/dL (ref 8–23)
CO2: 34 mmol/L — ABNORMAL HIGH (ref 22–32)
Calcium: 9 mg/dL (ref 8.9–10.3)
Chloride: 98 mmol/L (ref 98–111)
Creatinine, Ser: 0.99 mg/dL (ref 0.44–1.00)
GFR, Estimated: 57 mL/min — ABNORMAL LOW (ref >60)
Glucose, Bld: 103 mg/dL — ABNORMAL HIGH (ref 70–99)
Potassium: 4.4 mmol/L (ref 3.5–5.1)
Sodium: 140 mmol/L (ref 135–145)
Total Bilirubin: 0.9 mg/dL (ref 0.3–1.2)
Total Protein: 5.8 g/dL — ABNORMAL LOW (ref 6.5–8.1)

## 2022-08-20 LAB — CBC WITH DIFFERENTIAL/PLATELET
Abs Immature Granulocytes: 0.08 10*3/uL — ABNORMAL HIGH (ref 0.00–0.07)
Basophils Absolute: 0 10*3/uL (ref 0.0–0.1)
Basophils Relative: 0 %
Eosinophils Absolute: 0.6 10*3/uL — ABNORMAL HIGH (ref 0.0–0.5)
Eosinophils Relative: 7 %
HCT: 30.4 % — ABNORMAL LOW (ref 36.0–46.0)
Hemoglobin: 10 g/dL — ABNORMAL LOW (ref 12.0–15.0)
Immature Granulocytes: 1 %
Lymphocytes Relative: 17 %
Lymphs Abs: 1.5 10*3/uL (ref 0.7–4.0)
MCH: 33.2 pg (ref 26.0–34.0)
MCHC: 32.9 g/dL (ref 30.0–36.0)
MCV: 101 fL — ABNORMAL HIGH (ref 80.0–100.0)
Monocytes Absolute: 0.8 10*3/uL (ref 0.1–1.0)
Monocytes Relative: 9 %
Neutro Abs: 5.6 10*3/uL (ref 1.7–7.7)
Neutrophils Relative %: 66 %
Platelets: 188 10*3/uL (ref 150–400)
RBC: 3.01 MIL/uL — ABNORMAL LOW (ref 3.87–5.11)
RDW: 12.8 % (ref 11.5–15.5)
WBC: 8.6 10*3/uL (ref 4.0–10.5)
nRBC: 0 % (ref 0.0–0.2)

## 2022-08-20 LAB — MAGNESIUM: Magnesium: 2.2 mg/dL (ref 1.7–2.4)

## 2022-08-20 LAB — PHOSPHORUS: Phosphorus: 2.9 mg/dL (ref 2.5–4.6)

## 2022-08-20 MED ORDER — SOTALOL HCL 80 MG PO TABS: 80.0000 mg | ORAL_TABLET | Freq: Every day | ORAL | Status: DC

## 2022-08-20 MED ORDER — APIXABAN 5 MG PO TABS: 5.0000 mg | ORAL_TABLET | Freq: Two times a day (BID) | ORAL | Status: DC

## 2022-08-20 MED ORDER — SODIUM CHLORIDE 0.9% FLUSH
3.0000 mL | Freq: Two times a day (BID) | INTRAVENOUS | Status: DC
  Administered 2022-08-20 – 2022-08-21 (×3): 3 mL via INTRAVENOUS

## 2022-08-20 MED ORDER — SODIUM CHLORIDE 0.9% FLUSH: 3.0000 mL | INTRAVENOUS | Status: DC | PRN

## 2022-08-20 MED ORDER — SERTRALINE HCL 50 MG PO TABS
25.0000 mg | ORAL_TABLET | Freq: Every day | ORAL | Status: DC
  Administered 2022-08-21: 25 mg via ORAL
  Filled 2022-08-20: qty 1

## 2022-08-20 MED ORDER — SODIUM CHLORIDE 0.9 % IV SOLN: 250.0000 mL | INTRAVENOUS | Status: DC | PRN

## 2022-08-20 NOTE — TOC Progression Note (Signed)
Transition of Care [facility]) - Progression Note    Patient Details  Name: Kathy [name] MRN: [id number] Date of Birth: 11/08/1940  [facility]) CM/SW [place], [place] Phone Number: 08/20/2022, 11:12 AM  Clinical Narrative:     MD informed CSW anticipated dc tomorrow. CSW spoke with Sharyn Lull with [facility] who confirmed they can accept patient tomorrow if medically ready. CSW will continue to follow and assist with patients dc planning needs.  Expected Discharge Plan: [place] Barriers to Discharge: Continued Medical Work up  Expected Discharge Plan and Services In-house Referral: Clinical Social Work     Living arrangements for the past 2 months: Single Family Home                                       Social Determinants of Health (SDOH) Interventions SDOH Screenings   Food Insecurity: No Food Insecurity (08/14/2022)  Housing: Low Risk  (08/14/2022)  Transportation Needs: No Transportation Needs (08/14/2022)  Utilities: Not At Risk (08/14/2022)  Alcohol Screen: Low Risk  (02/09/2022)  Depression (PHQ2-9): Low Risk  (06/29/2022)  Financial Resource Strain: Low Risk  (02/09/2022)  Physical Activity: Inactive (02/09/2022)  Social Connections: Moderately Isolated (02/09/2022)  Stress: No Stress Concern Present (02/09/2022)  Tobacco Use: Medium Risk (08/18/2022)    Readmission Risk Interventions     No data to display

## 2022-08-20 NOTE — Plan of Care (Signed)

## 2022-08-20 NOTE — Progress Notes (Signed)
Subjective:  Patient has advanced dementia, presently sleeping, husband present at the bedside.  States that she is really gone down significantly with regard to her overall health status in the last few months.   Intake/Output from previous day:  I/O last 3 completed shifts: In: 360 [P.O.:360] Out: -  Total I/O In: 240 [P.O.:240] Out: -  Net IO Since Admission: 2,491.09 mL [08/20/22 1518]  Blood pressure (!) 140/64, pulse 64, temperature 98.1 F (36.7 C), temperature source Oral, resp. rate 15, height 5' (1.524 m), weight 102.1 kg, SpO2 99 %. Physical Exam Constitutional:      General: She is sleeping.     Appearance: She is morbidly obese.  Neck:     Vascular: No carotid bruit or JVD.  Cardiovascular:     Rate and Rhythm: Normal rate and regular rhythm.     Pulses: Intact distal pulses.     Heart sounds: Normal heart sounds. No murmur heard.    No gallop.  Pulmonary:     Effort: Pulmonary effort is normal.     Breath sounds: Examination of the right-middle field reveals rales. Examination of the left-middle field reveals rales. Examination of the right-lower field reveals rales. Examination of the left-lower field reveals rales. Rales (Coarse leathery crackles) present.  Abdominal:     General: Bowel sounds are normal.     Palpations: Abdomen is soft.  Musculoskeletal:     Right lower leg: No edema.     Left lower leg: No edema.  Neurological:     Mental Status: She is lethargic.     Lab Results: Lab Results  Component Value Date   NA 140 08/20/2022   K 4.4 08/20/2022   CO2 34 (H) 08/20/2022   GLUCOSE 103 (H) 08/20/2022   BUN 21 08/20/2022   CREATININE 0.99 08/20/2022   CALCIUM 9.0 08/20/2022   GFRNONAA 57 (L) 08/20/2022    BNP (last 3 results) Recent Labs    08/13/22 1619  BNP 164.2*     ProBNP (last 3 results) Recent Labs    12/11/21 1456  PROBNP 143.0*       Latest Ref Rng & Units 08/20/2022    3:35 AM 08/19/2022    3:17 AM 08/18/2022    3:58  AM  BMP  Glucose 70 - 99 mg/dL 103  117  132   BUN 8 - 23 mg/dL '21  21  26   '$ Creatinine 0.44 - 1.00 mg/dL 0.99  1.01  1.03   Sodium 135 - 145 mmol/L 140  138  134   Potassium 3.5 - 5.1 mmol/L 4.4  4.3  4.3   Chloride 98 - 111 mmol/L 98  97  94   CO2 22 - 32 mmol/L 34  35  32   Calcium 8.9 - 10.3 mg/dL 9.0  8.7  8.8       Latest Ref Rng & Units 08/20/2022    3:35 AM 08/15/2022    8:06 AM 08/13/2022    3:41 PM  Hepatic Function  Total Protein 6.5 - 8.1 g/dL 5.8  7.2  7.1   Albumin 3.5 - 5.0 g/dL 2.4  3.0  3.0   AST 15 - 41 U/L 23  33  36   ALT 0 - 44 U/L '20  23  19   '$ Alk Phosphatase 38 - 126 U/L 41  49  48   Total Bilirubin 0.3 - 1.2 mg/dL 0.9  0.5  0.4       Latest Ref Rng &  Units 08/20/2022    3:35 AM 08/19/2022    3:17 AM 08/18/2022    3:58 AM  CBC  WBC 4.0 - 10.5 K/uL 8.6  9.8  11.9   Hemoglobin 12.0 - 15.0 g/dL 10.0  9.9  10.5   Hematocrit 36.0 - 46.0 % 30.4  29.8  32.6   Platelets 150 - 400 K/uL 188  188  194    Lipid Panel     Component Value Date/Time   CHOL 149 07/05/2022 1016   TRIG 77.0 07/05/2022 1016   HDL 55.90 07/05/2022 1016   CHOLHDL 3 07/05/2022 1016   VLDL 15.4 07/05/2022 1016   LDLCALC 78 07/05/2022 1016   HEMOGLOBIN A1C Lab Results  Component Value Date   HGBA1C 5.8 07/05/2022   TSH Recent Labs    09/22/21 0939 07/05/22 1016  TSH 2.44 2.17    Imaging: CXR 08/13/2022: Rotated AP portable examination. Cardiomegaly with diffuse bilateral interstitial pulmonary opacity, likely edema. No focal airspace opacity.  Cardiac Studies:  EKG: EKG 08/13/2022: Atrial fibrillation with rapid ventricular response at the rate of 125 bpm, normal axis, diffuse nonspecific T abnormality.  Low-voltage complexes.  TEE 08/18/2022 1. Left ventricular ejection fraction, by estimation, is 50 to 55%. The left ventricle has low normal function. The left ventricle has no regional wall motion abnormalities.  2. Right ventricular systolic function is normal. The right  ventricular size is normal.  3. Left atrial size was moderately dilated. No left atrial/left atrial appendage thrombus was detected. The LAA emptying velocity was 26 cm/s.  4. A small pericardial effusion is present. The pericardial effusion is anterior to the right ventricle. There is no evidence of cardiac tamponade.  5. The mitral valve is degenerative. Mild mitral valve regurgitation. No evidence of mitral stenosis.  6. The aortic valve is tricuspid. Aortic valve regurgitation is not visualized. Aortic valve sclerosis is present, with no evidence of aortic valve stenosis.  7. There is mild (Grade II) plaque involving the descending aorta and ascending aorta.  8. Small PFO cannot be ruled out per color doppler. Agitated saline contrast bubble study was negative, with no evidence of any interatrial shunt.  9. Rhythm strip during this exam demonstrates atrial fibrillation.  Cardioversion 08/18/2022: Cardioverted 1 time(s).  Cardioversion with synchronized biphasic 200J shock.  Scheduled Meds:  amitriptyline  50 mg Oral QHS   apixaban  5 mg Oral BID   diltiazem  90 mg Oral Q12H   pantoprazole  40 mg Oral Daily   [START ON 08/21/2022] sertraline  25 mg Oral Daily   sodium chloride flush  3 mL Intravenous Q12H   sotalol  40 mg Oral Q12H   Continuous Infusions:  sodium chloride     PRN Meds:.sodium chloride, acetaminophen, albuterol, ondansetron (ZOFRAN) IV, mouth rinse, sodium chloride flush  Assessment  Kathy [name] is a 82 y.o. female patient with what appears like IPF, dementia, admitted to the hospital with admitted with UTI, CAP, altered mental status.  Cardiology consulted for management of A-fib.     Patient has functional and cognitive decline since Thanksgiving 2023.  Today during the exam she appeared to be confused as well, sleeping and drifts back to sleep on waking her.    1.  Paroxysmal atrial fibrillation 2.  Primary hypertension 3.  Advanced dementia and severe physical  deconditioning  Recommendation: Patient is presently maintaining sinus rhythm.  I reviewed her telemetry, occasional PVCs, EKG reveals normal sinus rhythm with normal QT interval and nonspecific T abnormality.  EKG improved from yesterday with shortening of the QT interval.  Patient is tolerating low-dose sotalol, continue sotalol and also diltiazem.  I have arranged patient to come back to our office in 3 weeks for follow-up.  Inpatient with severe comorbidity, morbid obesity, advanced dementia, physical deconditioning and essentially normal appears to be chair bound, consider also advance care planning.  We will sign off, call if questions.  Discussed with her husband at the bedside.    Adrian Prows, MD, [facility] [address] 08/20/2022, 3:18 PM Office: [phone] Fax: [phone] Pager: [phone]

## 2022-08-20 NOTE — Progress Notes (Signed)
Mobility Specialist Progress Note:   08/20/22 0945  Mobility  Activity Transferred from bed to chair  Level of Assistance Minimal assist, patient does 75% or more  Assistive Device Front wheel walker  Distance Ambulated (ft) 3 ft  Activity Response Tolerated well  Mobility Referral Yes  $Mobility charge 1 Mobility   Pt agreeable to transfer to chair. Required minA to stand with RW, minG to take steps to chair. Pt left with all needs met, will return to transfer back to bed.   Nelta Numbers Mobility Specialist Please contact via SecureChat or  Rehab office at [phone]

## 2022-08-20 NOTE — Progress Notes (Signed)
I called patient's son Aubra Walch, patient's daughter Grandville Silos and also spoke to patient's daughter-in-law [name] on a telephone conference call.  Patient's husband does not appear to comprehend everything that is going on.  Every time I seen Ms. Paulino, she is barely speaking, barely moves around, not sure that cardiac rehab or any kind of rehab she will be able to improve to be independent.  On questioning the family, patient has been pretty much chair bound since June of last year, doing very minimal activity, but mentally she has deteriorated significantly.  She is also told advanced dementia over the past 6 months and since last 2 weeks or so she has really been very confused.  With her presentation with pneumonia although currently not septic, most of her symptoms are confusional state and failure to thrive.  I am not sure that she will survive for the next 6 months.  Suspect she will develop sepsis, aspiration and/or decub ulcers and/or UTI and/or recurrent pulmonary infections and eventually succumbed to disease.  Patient's family agree that she has not been doing well, they want to make her DNR and also want to place her in palliative care and if she does not do well even consider comfort care.  I have communicated this with Dr. Loletha Grayer. Florene Glen, primary team.  End-of-life discussions were held in detail.  Patient's family will also speak to the patient's husband their father who is also elderly and is also developing mild cognitive disorder.  Extremely complex situation.  I spent 45 minutes with the discussions regarding her care and end-of-life issues.

## 2022-08-20 NOTE — Progress Notes (Addendum)
PROGRESS NOTE    Kathy [name]  [id number] DOB: [birth year] DOA: 08/13/2022 PCP: Biagio Borg, MD  Chief Complaint  Patient presents with   Altered Mental Status    Brief Narrative:   82 year old female with history of PAF on Eliquis, CKD 3A with baseline creatinine 1.2-1.4, ILD with chronic hypoxic respiratory failure on 2-3 L at home, recurrent UTIs who comes to the hospital with altered mental status.  Daughter tells me that she initially started to be confused around last Thanksgiving, was diagnosed with Kathy [name] UTI and her mental status cleared with treatment.  Had recurrent UTI in January which was treated, microbiology showed ESBL E. coli.  She has been having intermittent confusion since, more so in the last few days, and she was brought to the ER.  Daughter also reports worsening shortness of breath and increased cough in the last few days.  She has chronic knee problems and had steroid injection in to the knees couple days ago.  In the ED she was found to be in Kadan Millstein-fib with RVR and started on Cardizem drip.  Due to poorly controlled rates, cardiology was consulted   Assessment & Plan:   Principal Problem:   Atrial fibrillation with RVR ([place]) Active Problems:   Community acquired pneumonia   UTI due to extended-spectrum beta lactamase (ESBL) producing Escherichia coli   Essential hypertension   ILD (interstitial lung disease) (HCC)   CKD (chronic kidney disease) stage 3, GFR 30-59 ml/min (HCC)   CAP (community acquired pneumonia)   Acute metabolic encephalopathy, likely due to community-acquired pneumonia -patient's imaging shows suspicious for right upper lobe pneumonia.  She is symptomatic with cough, congestion, and has Kathy [name] significant leukocytosis.  s/p azithro/meropenem.  White count improving.  Monitor blood cultures, negative so far "Suspect she may have some developing underlying dementia." - per prior hospitalist.   Will need further w/u outpatient.   Cough Bringing up some  dark sputum at times, overall this seems to be improving Repeat CXR  Kathy [name]-fib with RVR - now s/p TEE cardioversion with cards Continue eliquis, metoprolol, dilt per cards Sotalol per cards  Recent ESBL E. coli UTI -urinalysis now clean.  Getting treated for above with carbapenem.   Hyperkalemia- resolved   Chronic kidney disease stage IIIa- appears at baseline   ILD, chronic hypoxic respiratory failure -respiratory status appears close to baseline On 2-3 L at home   Essential hypertension-resume metoprolol, diltiazem.  Hold ARB   Sore Throat Negative covid, influenza, RSV Likely related to TEE?  Cognitive Deficit Follow with neurology outpatient  Goals of care Will recommend palliative follow outpatient based on discussion with cards    DVT prophylaxis: eliquis Code Status: full Family Communication: husband - discussed code status with daughter later in day, she thinks DNR/DNI would be appropriate.  Doesn't sound like there's Skyah Hannon HCPOA.  Will discuss with husband first, plan to do this tmrw. Disposition:   Status is: Inpatient Remains inpatient appropriate because: pending further improvemetn   Consultants:  cardiology  Procedures:  TEE Cardioversion IMPRESSIONS     1. Left ventricular ejection fraction, by estimation, is 50 to 55%. The  left ventricle has low normal function. The left ventricle has no regional  wall motion abnormalities.   2. Right ventricular systolic function is normal. The right ventricular  size is normal.   3. Left atrial size was moderately dilated. No left atrial/left atrial  appendage thrombus was detected. The LAA emptying velocity was 26 cm/s.   4. Kathy [name] small  pericardial effusion is present. The pericardial effusion is  anterior to the right ventricle. There is no evidence of cardiac  tamponade.   5. The mitral valve is degenerative. Mild mitral valve regurgitation. No  evidence of mitral stenosis.   6. The aortic valve is tricuspid.  Aortic valve regurgitation is not  visualized. Aortic valve sclerosis is present, with no evidence of aortic  valve stenosis.   7. There is mild (Grade II) plaque involving the descending aorta and  ascending aorta.   8. Small PFO cannot be ruled out per color doppler. Agitated saline  contrast bubble study was negative, with no evidence of any interatrial  shunt.   9. Rhythm strip during this exam demonstrates atrial fibrillation.   Comparison(s): TTE from 02/18/2020: LVEF 45-50%, indeterminate diastolic  parameter, RVSP 36.58mHG, mild LAE, estimated RAP 88mG.   Conclusion(s)/Recommendation(s): No LA/LAA thrombus identified. Successful  cardioversion performed with restoration of normal sinus rhythm.    Antimicrobials:  Anti-infectives (From admission, onward)    Start     Dose/Rate Route Frequency Ordered Stop   08/18/22 0400  azithromycin (ZITHROMAX) 500 mg in sodium chloride 0.9 % 250 mL IVPB        500 mg 250 mL/hr over 60 Minutes Intravenous Every 24 hours 08/17/22 0941 08/18/22 0518   08/17/22 2200  meropenem (MERREM) 1 g in sodium chloride 0.9 % 100 mL IVPB        1 g 200 mL/hr over 30 Minutes Intravenous Every 12 hours 08/17/22 0941 08/18/22 0947   08/14/22 0400  azithromycin (ZITHROMAX) 500 mg in sodium chloride 0.9 % 250 mL IVPB  Status:  Discontinued        500 mg 250 mL/hr over 60 Minutes Intravenous Every 24 hours 08/14/22 0306 08/17/22 0941   08/13/22 2115  meropenem (MERREM) 1 g in sodium chloride 0.9 % 100 mL IVPB  Status:  Discontinued        1 g 200 mL/hr over 30 Minutes Intravenous Every 12 hours 08/13/22 2108 08/17/22 0941       Subjective: Shows me dark sputum she coughed up  Objective: Vitals:   08/19/22 1504 08/19/22 2035 08/20/22 0340 08/20/22 0816  BP: 125/64 (!) 127/56 (!) 122/55 (!) 140/64  Pulse: 60 63 61 64  Resp: '19 17 15 15  '$ Temp: 98.5 F (36.9 C) 98.5 F (36.9 C) 97.9 F (36.6 C) 98.1 F (36.7 C)  TempSrc: Oral Oral Oral Oral   SpO2: 98% 94% 99% 99%  Weight:      Height:        Intake/Output Summary (Last 24 hours) at 08/20/2022 1556 Last data filed at 08/20/2022 0900 Gross per 24 hour  Intake 240 ml  Output --  Net 240 ml   Filed Weights   08/13/22 1538  Weight: 102.1 kg    Examination:  General: No acute distress. Cardiovascular: RRR Lungs: Clear to auscultation bilaterally  Abdomen: Soft, nontender, nondistended Neurological: Alert and oriented 3. Moves all extremities 4 with equal strength. Cranial nerves II through XII grossly intact. Extremities: No clubbing or cyanosis. No edema.   Data Reviewed: I have personally reviewed following labs and imaging studies  CBC: Recent Labs  Lab 08/16/22 0251 08/17/22 0251 08/18/22 0358 08/19/22 0317 08/20/22 0335  WBC 14.9* 12.9* 11.9* 9.8 8.6  NEUTROABS  --   --   --   --  5.6  HGB 11.1* 10.9* 10.5* 9.9* 10.0*  HCT 34.7* 34.8* 32.6* 29.8* 30.4*  MCV 101.5* 103.3* 100.6* 101.7*  101.0*  PLT 230 189 194 188 0000000    Basic Metabolic Panel: Recent Labs  Lab 08/16/22 0251 08/17/22 0251 08/18/22 0358 08/19/22 0317 08/20/22 0335  NA 138 137 134* 138 140  K 5.2* 4.9 4.3 4.3 4.4  CL 98 97* 94* 97* 98  CO2 29 32 32 35* 34*  GLUCOSE 115* 98 132* 117* 103*  BUN 38* 30* 26* 21 21  CREATININE 1.28* 1.12* 1.03* 1.01* 0.99  CALCIUM 9.4 8.9 8.8* 8.7* 9.0  MG 2.3 2.1 2.1 2.1 2.2  PHOS  --   --   --  3.1 2.9    GFR: Estimated Creatinine Clearance: 47.9 mL/min (by C-G formula based on SCr of 0.99 mg/dL).  Liver Function Tests: Recent Labs  Lab 08/15/22 0806 08/20/22 0335  AST 33 23  ALT 23 20  ALKPHOS 49 41  BILITOT 0.5 0.9  PROT 7.2 5.8*  ALBUMIN 3.0* 2.4*    CBG: Recent Labs  Lab 08/13/22 1609  GLUCAP 242*     Recent Results (from the past 240 hour(s))  Resp panel by RT-PCR (RSV, Flu Vandana Haman&B, Covid) Anterior Nasal Swab     Status: None   Collection Time: 08/13/22  4:19 PM   Specimen: Anterior Nasal Swab  Result Value Ref Range  Status   SARS Coronavirus 2 by RT PCR NEGATIVE NEGATIVE Final   Influenza Azrael Maddix by PCR NEGATIVE NEGATIVE Final   Influenza B by PCR NEGATIVE NEGATIVE Final    Comment: (NOTE) The Xpert Xpress SARS-CoV-2/FLU/RSV plus assay is intended as an aid in the diagnosis of influenza from Nasopharyngeal swab specimens and should not be used as Dwyane Dupree sole basis for treatment. Nasal washings and aspirates are unacceptable for Xpert Xpress SARS-CoV-2/FLU/RSV testing.  Fact Sheet for Patients: EntrepreneurPulse.com.au  Fact Sheet for Healthcare Providers: IncredibleEmployment.be  This test is not yet approved or cleared by the [place] FDA and has been authorized for detection and/or diagnosis of SARS-CoV-2 by FDA under an Emergency Use Authorization (EUA). This EUA will remain in effect (meaning this test can be used) for the duration of the COVID-19 declaration under Section 564(b)(1) of the Act, 21 U.S.C. section 360bbb-3(b)(1), unless the authorization is terminated or revoked.     Resp Syncytial Virus by PCR NEGATIVE NEGATIVE Final    Comment: (NOTE) Fact Sheet for Patients: EntrepreneurPulse.com.au  Fact Sheet for Healthcare Providers: IncredibleEmployment.be  This test is not yet approved or cleared by the [place] FDA and has been authorized for detection and/or diagnosis of SARS-CoV-2 by FDA under an Emergency Use Authorization (EUA). This EUA will remain in effect (meaning this test can be used) for the duration of the COVID-19 declaration under Section 564(b)(1) of the Act, 21 U.S.C. section 360bbb-3(b)(1), unless the authorization is terminated or revoked.  Performed at [facility], [place] [address], [place], [place] [address]   Blood culture (routine x 2)     Status: None   Collection Time: 08/13/22  5:52 PM   Specimen: BLOOD  Result Value Ref Range Status   Specimen Description BLOOD LEFT  ANTECUBITAL  Final   Special Requests   Final    BOTTLES DRAWN AEROBIC AND ANAEROBIC Blood Culture results may not be optimal due to an inadequate volume of blood received in culture bottles   Culture   Final    NO GROWTH 5 DAYS Performed at [facility], [place] [address] [place], [place] [address]    Report Status 08/18/2022 FINAL  Final  Blood  culture (routine x 2)     Status: None   Collection Time: 08/13/22  5:57 PM   Specimen: BLOOD  Result Value Ref Range Status   Specimen Description BLOOD RIGHT ANTECUBITAL  Final   Special Requests   Final    BOTTLES DRAWN AEROBIC AND ANAEROBIC Blood Culture results may not be optimal due to an inadequate volume of blood received in culture bottles   Culture   Final    NO GROWTH 5 DAYS Performed at [facility], [place] [address], [place], [place] [address]    Report Status 08/18/2022 FINAL  Final  Resp panel by RT-PCR (RSV, Flu Marshelle Bilger&B, Covid) Anterior Nasal Swab     Status: None   Collection Time: 08/19/22  3:04 PM   Specimen: Anterior Nasal Swab  Result Value Ref Range Status   SARS Coronavirus 2 by RT PCR NEGATIVE NEGATIVE Final   Influenza Vinita Prentiss by PCR NEGATIVE NEGATIVE Final   Influenza B by PCR NEGATIVE NEGATIVE Final    Comment: (NOTE) The Xpert Xpress SARS-CoV-2/FLU/RSV plus assay is intended as an aid in the diagnosis of influenza from Nasopharyngeal swab specimens and should not be used as Sommer Spickard sole basis for treatment. Nasal washings and aspirates are unacceptable for Xpert Xpress SARS-CoV-2/FLU/RSV testing.  Fact Sheet for Patients: EntrepreneurPulse.com.au  Fact Sheet for Healthcare Providers: IncredibleEmployment.be  This test is not yet approved or cleared by the [place] FDA and has been authorized for detection and/or diagnosis of SARS-CoV-2 by FDA under an Emergency Use Authorization (EUA). This EUA will remain in effect (meaning this test can be used) for the duration of  the COVID-19 declaration under Section 564(b)(1) of the Act, 21 U.S.C. section 360bbb-3(b)(1), unless the authorization is terminated or revoked.     Resp Syncytial Virus by PCR NEGATIVE NEGATIVE Final    Comment: (NOTE) Fact Sheet for Patients: EntrepreneurPulse.com.au  Fact Sheet for Healthcare Providers: IncredibleEmployment.be  This test is not yet approved or cleared by the [place] FDA and has been authorized for detection and/or diagnosis of SARS-CoV-2 by FDA under an Emergency Use Authorization (EUA). This EUA will remain in effect (meaning this test can be used) for the duration of the COVID-19 declaration under Section 564(b)(1) of the Act, 21 U.S.C. section 360bbb-3(b)(1), unless the authorization is terminated or revoked.  Performed at [facility], [place] [address] [place],  [address]          Radiology Studies: No results found.      Scheduled Meds:  amitriptyline  50 mg Oral QHS   apixaban  5 mg Oral BID   diltiazem  90 mg Oral Q12H   pantoprazole  40 mg Oral Daily   [START ON 08/21/2022] sertraline  25 mg Oral Daily   sodium chloride flush  3 mL Intravenous Q12H   sotalol  40 mg Oral Q12H   Continuous Infusions:  sodium chloride       LOS: 6 days    Time spent: over 30 min    Fayrene Helper, MD [facility]   To contact the attending provider between 7A-7P or the covering provider during after hours 7P-7A, please log into the web site [email or web address] and access using universal [place] password for that web site. If you do not have the password, please call the hospital operator.  08/20/2022, 3:56 PM

## 2022-08-20 NOTE — Progress Notes (Addendum)
Pharmacy Consult for Sotalol Electrolyte Replacement- Follow Up  Pharmacy consulted to assist in monitoring and replacing electrolytes in this 82 y.o. female admitted on 08/13/2022 undergoing sotalol initiation .   Labs:    Component Value Date/Time   K 4.4 08/20/2022 0335   MG 2.2 08/20/2022 0335    Citalopram and amitriptyline do have a conditional risk for Qtc prolongation - will change from citalopram to sertraline.  Plan: Potassium: K >/= 4: No additional supplementation needed  Magnesium: Mg > 2: No additional supplementation needed   Antonietta Jewel, PharmD, BCCCP Clinical Pharmacist  Phone: [phone] 08/20/2022 11:41 AM  Please check AMION for all [place] phone numbers After 10:00 PM, call [place] [phone]

## 2022-08-20 NOTE — Care Management Important Message (Signed)
Important Message  Patient Details  Name: Kathy [name] MRN: [id number] Date of Birth: [birth year]   Medicare Important Message Given:  Yes     Shelda Altes 08/20/2022, 10:21 AM

## 2022-08-20 NOTE — TOC Benefit Eligibility Note (Signed)
Patient Teacher, English as a foreign language completed.    The patient is currently admitted and upon discharge could be taking sotalol (Betapace) 80 mg.  The current 30 day co-pay is $3.00.   The patient is insured through [place], [place] Patient Advocate Specialist [place] Patient Advocate Team Direct Number: [phone]  Fax: [phone]

## 2022-08-21 DIAGNOSIS — Z515 Encounter for palliative care: Secondary | ICD-10-CM

## 2022-08-21 DIAGNOSIS — A419 Sepsis, unspecified organism: Secondary | ICD-10-CM | POA: Diagnosis not present

## 2022-08-21 DIAGNOSIS — I4891 Unspecified atrial fibrillation: Secondary | ICD-10-CM | POA: Diagnosis not present

## 2022-08-21 DIAGNOSIS — J9611 Chronic respiratory failure with hypoxia: Secondary | ICD-10-CM | POA: Diagnosis not present

## 2022-08-21 DIAGNOSIS — R0902 Hypoxemia: Secondary | ICD-10-CM | POA: Diagnosis not present

## 2022-08-21 DIAGNOSIS — F0390 Unspecified dementia without behavioral disturbance: Secondary | ICD-10-CM | POA: Diagnosis not present

## 2022-08-21 DIAGNOSIS — I10 Essential (primary) hypertension: Secondary | ICD-10-CM | POA: Diagnosis not present

## 2022-08-21 DIAGNOSIS — M6281 Muscle weakness (generalized): Secondary | ICD-10-CM | POA: Diagnosis not present

## 2022-08-21 DIAGNOSIS — J189 Pneumonia, unspecified organism: Secondary | ICD-10-CM | POA: Diagnosis not present

## 2022-08-21 DIAGNOSIS — Z1152 Encounter for screening for COVID-19: Secondary | ICD-10-CM | POA: Diagnosis not present

## 2022-08-21 DIAGNOSIS — N1831 Chronic kidney disease, stage 3a: Secondary | ICD-10-CM | POA: Diagnosis not present

## 2022-08-21 DIAGNOSIS — N183 Chronic kidney disease, stage 3 unspecified: Secondary | ICD-10-CM | POA: Diagnosis not present

## 2022-08-21 DIAGNOSIS — F29 Unspecified psychosis not due to a substance or known physiological condition: Secondary | ICD-10-CM | POA: Diagnosis not present

## 2022-08-21 DIAGNOSIS — K5909 Other constipation: Secondary | ICD-10-CM | POA: Diagnosis not present

## 2022-08-21 DIAGNOSIS — J188 Other pneumonia, unspecified organism: Secondary | ICD-10-CM | POA: Diagnosis not present

## 2022-08-21 DIAGNOSIS — I4819 Other persistent atrial fibrillation: Secondary | ICD-10-CM | POA: Diagnosis not present

## 2022-08-21 DIAGNOSIS — I5020 Unspecified systolic (congestive) heart failure: Secondary | ICD-10-CM | POA: Diagnosis not present

## 2022-08-21 DIAGNOSIS — N390 Urinary tract infection, site not specified: Secondary | ICD-10-CM | POA: Diagnosis not present

## 2022-08-21 DIAGNOSIS — R2681 Unsteadiness on feet: Secondary | ICD-10-CM | POA: Diagnosis not present

## 2022-08-21 DIAGNOSIS — K2100 Gastro-esophageal reflux disease with esophagitis, without bleeding: Secondary | ICD-10-CM | POA: Diagnosis not present

## 2022-08-21 DIAGNOSIS — J679 Hypersensitivity pneumonitis due to unspecified organic dust: Secondary | ICD-10-CM | POA: Diagnosis not present

## 2022-08-21 DIAGNOSIS — I272 Pulmonary hypertension, unspecified: Secondary | ICD-10-CM | POA: Diagnosis not present

## 2022-08-21 DIAGNOSIS — I499 Cardiac arrhythmia, unspecified: Secondary | ICD-10-CM | POA: Diagnosis not present

## 2022-08-21 DIAGNOSIS — R488 Other symbolic dysfunctions: Secondary | ICD-10-CM | POA: Diagnosis not present

## 2022-08-21 DIAGNOSIS — R635 Abnormal weight gain: Secondary | ICD-10-CM | POA: Diagnosis not present

## 2022-08-21 DIAGNOSIS — G9341 Metabolic encephalopathy: Secondary | ICD-10-CM | POA: Diagnosis not present

## 2022-08-21 DIAGNOSIS — J8410 Pulmonary fibrosis, unspecified: Secondary | ICD-10-CM | POA: Diagnosis not present

## 2022-08-21 DIAGNOSIS — Z66 Do not resuscitate: Secondary | ICD-10-CM | POA: Diagnosis not present

## 2022-08-21 DIAGNOSIS — I480 Paroxysmal atrial fibrillation: Secondary | ICD-10-CM | POA: Diagnosis not present

## 2022-08-21 DIAGNOSIS — F32A Depression, unspecified: Secondary | ICD-10-CM | POA: Diagnosis not present

## 2022-08-21 DIAGNOSIS — I130 Hypertensive heart and chronic kidney disease with heart failure and stage 1 through stage 4 chronic kidney disease, or unspecified chronic kidney disease: Secondary | ICD-10-CM | POA: Diagnosis not present

## 2022-08-21 DIAGNOSIS — R4182 Altered mental status, unspecified: Secondary | ICD-10-CM | POA: Diagnosis not present

## 2022-08-21 DIAGNOSIS — Z7401 Bed confinement status: Secondary | ICD-10-CM | POA: Diagnosis not present

## 2022-08-21 LAB — CBC
HCT: 34.3 % — ABNORMAL LOW (ref 36.0–46.0)
Hemoglobin: 10.7 g/dL — ABNORMAL LOW (ref 12.0–15.0)
MCH: 32.2 pg (ref 26.0–34.0)
MCHC: 31.2 g/dL (ref 30.0–36.0)
MCV: 103.3 fL — ABNORMAL HIGH (ref 80.0–100.0)
Platelets: 190 10*3/uL (ref 150–400)
RBC: 3.32 MIL/uL — ABNORMAL LOW (ref 3.87–5.11)
RDW: 12.6 % (ref 11.5–15.5)
WBC: 8 10*3/uL (ref 4.0–10.5)
nRBC: 0 % (ref 0.0–0.2)

## 2022-08-21 LAB — BASIC METABOLIC PANEL
Anion gap: 7 (ref 5–15)
BUN: 17 mg/dL (ref 8–23)
CO2: 34 mmol/L — ABNORMAL HIGH (ref 22–32)
Calcium: 8.7 mg/dL — ABNORMAL LOW (ref 8.9–10.3)
Chloride: 98 mmol/L (ref 98–111)
Creatinine, Ser: 1.1 mg/dL — ABNORMAL HIGH (ref 0.44–1.00)
GFR, Estimated: 50 mL/min — ABNORMAL LOW (ref >60)
Glucose, Bld: 112 mg/dL — ABNORMAL HIGH (ref 70–99)
Potassium: 4.5 mmol/L (ref 3.5–5.1)
Sodium: 139 mmol/L (ref 135–145)

## 2022-08-21 LAB — PHOSPHORUS: Phosphorus: 3.4 mg/dL (ref 2.5–4.6)

## 2022-08-21 LAB — MAGNESIUM: Magnesium: 2.1 mg/dL (ref 1.7–2.4)

## 2022-08-21 MED ORDER — SOTALOL HCL 80 MG PO TABS: 40.0000 mg | ORAL_TABLET | Freq: Two times a day (BID) | ORAL | 0 refills | Status: DC

## 2022-08-21 MED ORDER — SERTRALINE HCL 25 MG PO TABS: 25.0000 mg | ORAL_TABLET | Freq: Every day | ORAL | 0 refills | Status: DC

## 2022-08-21 MED ORDER — DILTIAZEM HCL ER 90 MG PO CP12: 90.0000 mg | ORAL_CAPSULE | Freq: Two times a day (BID) | ORAL | 0 refills | Status: DC

## 2022-08-21 MED ORDER — AMITRIPTYLINE HCL 50 MG PO TABS: 50.0000 mg | ORAL_TABLET | Freq: Every evening | ORAL | Status: DC | PRN

## 2022-08-21 NOTE — Progress Notes (Signed)
Pt's report called out to [place], Therapist, sports at [employer]. All questions answered. Pt's Family is aware of pt going to [facility]. Family at bedside.

## 2022-08-21 NOTE — TOC Progression Note (Addendum)
Transition of Care [facility]) - Progression Note    Patient Details  Name: Kathy [name] MRN: [id number] Date of Birth: 1940/07/16  Transition of Care [facility] CM/SW Contact  Veer Elamin [place], [place] Phone Number: 08/21/2022, 11:45 AM  Clinical Narrative:     Phone call to [facility], spoke with Sharyn Lull who confirms that patient is able to admit today. She will be going to room 606. Nurse to call in report to [phone]. [facility] will be contacted for transport.  12:06pm Patient's spouse notified of discharge plan to [facility] by [facility].  12;42pm [facility] contacted, transport arranged to [facility] [place] Transition of Care [phone]   Expected Discharge Plan: [place] Barriers to Discharge: Continued Medical Work up  Expected Discharge Plan and Services In-house Referral: Clinical Social Work     Living arrangements for the past 2 months: Single Family Home Expected Discharge Date: 08/21/22                                     Social Determinants of Health (SDOH) Interventions SDOH Screenings   Food Insecurity: No Food Insecurity (08/14/2022)  Housing: Low Risk  (08/14/2022)  Transportation Needs: No Transportation Needs (08/14/2022)  Utilities: Not At Risk (08/14/2022)  Alcohol Screen: Low Risk  (02/09/2022)  Depression (PHQ2-9): Low Risk  (06/29/2022)  Financial Resource Strain: Low Risk  (02/09/2022)  Physical Activity: Inactive (02/09/2022)  Social Connections: Moderately Isolated (02/09/2022)  Stress: No Stress Concern Present (02/09/2022)  Tobacco Use: Medium Risk (08/18/2022)    Readmission Risk Interventions     No data to display

## 2022-08-21 NOTE — Progress Notes (Signed)
Mobility Specialist Progress Note:   08/21/22 1030  Mobility  Activity Transferred from bed to chair  Level of Assistance Moderate assist, patient does 50-74%  Assistive Device Front wheel walker  Distance Ambulated (ft) 3 ft  Activity Response Tolerated well  Mobility Referral Yes  $Mobility charge 1 Mobility   Pt agreeable to mobility session. Required modA to achieve EOB, minA to stand with RW. Able to take steps with minG. Left in bed chair with all needs met.  Nelta Numbers Mobility Specialist Please contact via SecureChat or  Rehab office at [phone]

## 2022-08-21 NOTE — Progress Notes (Signed)
   Palliative Medicine Inpatient Follow Up Note   Have been asked by Dr. Florene Glen about how to complete MOST form without family present.   Discussed the obtain of obtaining verbal consent from family which he was able to do.   Additionally, [employer] allows for E-MOST. This was completed over the phone with official signature of patients son based upon families wishes.  Plan for discharge with OP Palliative support.  No Charge ______________________________________________________________________________________ [place] Team Team Cell Phone: [phone] Please utilize secure chat with additional questions, if there is no response within 30 minutes please call the above phone number  Palliative Medicine Team providers are available by phone from 7am to 7pm daily and can be reached through the team cell phone.  Should this patient require assistance outside of these hours, please call the patient's attending physician.

## 2022-08-21 NOTE — Discharge Summary (Signed)
Physician Discharge Summary  Kathy [name] [id number] DOB: 07/24/1940 DOA: 08/13/2022  PCP: Kathy Borg, MD  Admit date: 08/13/2022 Discharge date: 08/21/2022  Time spent: 40 minutes  Recommendations for Outpatient Follow-up:  Follow outpatient CBC/CMP  Follow with outpatient palliative care - MOST form completed here, would continue to review and discuss with family Follow with cardiology outpatient Follow with Dr. Vaughan [name] outpatient for her ILD, needs this follow up scheduled - needs repeat chest imaging Follow with neurology for cognitive deficit, dementia suspected  Discharge Diagnoses:  Principal Problem:   Atrial fibrillation with RVR ([place]) Active Problems:   Community acquired pneumonia   UTI due to extended-spectrum beta lactamase (ESBL) producing Escherichia coli   Essential hypertension   ILD (interstitial lung disease) ([place])   CKD (chronic kidney disease) stage 3, GFR 30-59 ml/min ([facility])   CAP (community acquired pneumonia)   Discharge Condition: stable  Diet recommendation: heart healthy  Filed Weights   08/13/22 1538  Weight: 102.1 kg    History of present illness:  82 year old female with history of PAF on Eliquis, CKD 3A with baseline creatinine 1.2-1.4, ILD with chronic hypoxic respiratory failure on 2-3 L at home, recurrent UTIs who comes to the hospital with altered mental status.     She was diagnosed with pneumonia and has improved with IV antibiotics.  Hospitalization was complicated by afib, she's s/p TEE/cardioversion.  Discharging on sotalol.  Plan for palliative to follow at discharge.  Still has underlying cognitive deficit that will need follow up outpatient with neurology.  See below for additional details  Hospital Course:  Assessment and Plan:  Goals of care Will recommend palliative follow outpatient based on discussion with cards.  Based on discussion with family (husband, son), they're ok with DNR (but they would be ok with trial of  intubation if thought clinically appropriate for temporary cause).  She's eating and drinking, but sleeping Kathy [name] lot during the day (chronic per son).  Palliative care should follow at discharge Patient needs most form reviewed after discharge (I completed one here, discussing with husband, son, daughter over phone)  Acute metabolic encephalopathy, likely due to community-acquired pneumonia  patient's imaging shows suspicious for right upper lobe pneumonia.   s/p azithro/meropenem.  White count improving.  Monitor blood cultures, negative so far   Cough Bringing up some dark sputum at times, overall this seems to be improving Repeat CXR   Kathy [name] with RVR - now s/p TEE cardioversion with cards Continue eliquis, metoprolol, dilt per cards Sotalol per cards   Recent ESBL E. coli UTI -urinalysis now clean.  Getting treated for above with carbapenem.   Hyperkalemia- resolved   Chronic kidney disease stage IIIa- appears at baseline   ILD, chronic hypoxic respiratory failure -respiratory status appears close to baseline On 2-3 L at home, currently on home O2 Needs to follow up repeat imaging outpatient - CT with progression of pulm parenchymal fibrosis since prior CT Follow with outpatient provider   Essential hypertension resume metoprolol, diltiazem at discharge   Sore Throat Negative covid, influenza, RSV Likely related to TEE? resolved   Cognitive Deficit Follow with neurology outpatient "Suspect she may have some developing underlying dementia." - per prior hospitalist.   Will need further w/u outpatient.     Procedures: TEE/cardioversion 2/21   MPRESSIONS     1. Left ventricular ejection fraction, by estimation, is 50 to 55%. The  left ventricle has low normal function. The left ventricle has no regional  wall motion abnormalities.  2. Right ventricular systolic function is normal. The right ventricular  size is normal.   3. Left atrial size was moderately dilated. No  left atrial/left atrial  appendage thrombus was detected. The LAA emptying velocity was 26 cm/s.   4. Kathy [name] small pericardial effusion is present. The pericardial effusion is  anterior to the right ventricle. There is no evidence of cardiac  tamponade.   5. The mitral valve is degenerative. Mild mitral valve regurgitation. No  evidence of mitral stenosis.   6. The aortic valve is tricuspid. Aortic valve regurgitation is not  visualized. Aortic valve sclerosis is present, with no evidence of aortic  valve stenosis.   7. There is mild (Grade II) plaque involving the descending aorta and  ascending aorta.   8. Small PFO cannot be ruled out per color doppler. Agitated saline  contrast bubble study was negative, with no evidence of any interatrial  shunt.   9. Rhythm strip during this exam demonstrates atrial fibrillation.   Comparison(s): TTE from 02/18/2020: LVEF 45-50%, indeterminate diastolic  parameter, RVSP 36.70mHG, mild LAE, estimated RAP 811mG.   Conclusion(s)/Recommendation(s): No LA/LAA thrombus identified. Successful  cardioversion performed with restoration of normal sinus rhythm.   Consultations: cardiology  Discharge Exam: Vitals:   08/21/22 0428 08/21/22 0928  BP: (!) 143/69 (!) 125/54  Pulse: (!) 57 67  Resp: 16 14  Temp: 98 F (36.7 C) 97.6 F (36.4 C)  SpO2: 100% 100%   Sleepy, but no complaints Mildly confused, saying something about Kathy [name] picture on the TV Discussed with husband, son  General: No acute distress. Cardiovascular: RRR Lungs: unlabored Abdomen: Soft, nontender, nondistended Neurological: Alert and oriented 3. Moves all extremities 4 with equal strength. Cranial nerves II through XII grossly intact. Extremities: No clubbing or cyanosis. No edema.  Discharge Instructions   Discharge Instructions     Ambulatory referral to Neurology   Complete by: As directed    An appointment is requested in approximately: 2 weeks   Call MD for:  difficulty  breathing, headache or visual disturbances   Complete by: As directed    Call MD for:  extreme fatigue   Complete by: As directed    Call MD for:  hives   Complete by: As directed    Call MD for:  persistant dizziness or light-headedness   Complete by: As directed    Call MD for:  persistant nausea and vomiting   Complete by: As directed    Call MD for:  redness, tenderness, or signs of infection (pain, swelling, redness, odor or green/yellow discharge around incision site)   Complete by: As directed    Call MD for:  severe uncontrolled pain   Complete by: As directed    Call MD for:  temperature >100.4   Complete by: As directed    Diet - low sodium heart healthy   Complete by: As directed    Discharge instructions   Complete by: As directed    You were seen for pneumonia and confusion.  You've been treated with antibiotics and have overall improved.  Follow up with your outpatient doctors for repeat imaging and follow up of your underlying lung disease.  I suspect you have an underlying neurocognitive deficit, likely dementia.  You should follow with neurology as an outpatient.    I recommend palliative care follow as an outpatient to clarify goals of care.    Return for new, recurrent, or worsening symptoms.  Please ask your PCP to request records from  this hospitalization so they know what was done and what the next steps will be.   Increase activity slowly   Complete by: As directed       Allergies as of 08/21/2022       Reactions   Lipitor [atorvastatin] Other (See Comments)   Memory issues   Requip [ropinirole Hcl] Other (See Comments)   Pt reports feeling generally unwell on this medication        Medication List     STOP taking these medications    citalopram 10 MG tablet Commonly known as: CeleXA   diltiazem 120 MG tablet Commonly known as: CARDIZEM Replaced by: diltiazem 90 MG 12 hr capsule   fenofibrate micronized 134 MG capsule Commonly known  as: LOFIBRA   furosemide 40 MG tablet Commonly known as: Lasix   losartan 100 MG tablet Commonly known as: COZAAR   meloxicam 15 MG tablet Commonly known as: MOBIC   metoprolol succinate 50 MG 24 hr tablet Commonly known as: TOPROL-XL   nitrofurantoin (macrocrystal-monohydrate) 100 MG capsule Commonly known as: Macrobid   UNABLE TO FIND       TAKE these medications    albuterol 108 (90 Base) MCG/ACT inhaler Commonly known as: VENTOLIN HFA Inhale 1-2 puffs into the lungs every 6 (six) hours as needed for wheezing or shortness of breath.   amitriptyline 50 MG tablet Commonly known as: ELAVIL Take 1 tablet (50 mg total) by mouth at bedtime as needed for sleep. And neuropathy What changed:  medication strength how much to take how to take this when to take this reasons to take this additional instructions   apixaban 5 MG Tabs tablet Commonly known as: ELIQUIS Take 1 tablet (5 mg total) by mouth 2 (two) times daily.   cyanocobalamin 1000 MCG/ML injection Commonly known as: VITAMIN B12 INJECT 1 ML INTO MUSCLE EVERY 30 DAYS   diltiazem 90 MG 12 hr capsule Commonly known as: CARDIZEM SR Take 1 capsule (90 mg total) by mouth every 12 (twelve) hours. Replaces: diltiazem 120 MG tablet   Flutter Devi Use as directed   pantoprazole 40 MG tablet Commonly known as: PROTONIX TAKE 1 TABLET BY MOUTH EVERY DAY   sertraline 25 MG tablet Commonly known as: ZOLOFT Take 1 tablet (25 mg total) by mouth daily. Start taking on: August 22, 2022   sotalol 80 MG tablet Commonly known as: BETAPACE Take 0.5 tablets (40 mg total) by mouth every 12 (twelve) hours.   triamcinolone cream 0.5 % Commonly known as: KENALOG Apply 1 Application topically 2 (two) times daily as needed. What changed: reasons to take this   Vitamin D-3 25 MCG (1000 UT) Caps Take 1 capsule by mouth daily.       Allergies  Allergen Reactions   Lipitor [Atorvastatin] Other (See Comments)     Memory issues   Requip [Ropinirole Hcl] Other (See Comments)    Pt reports feeling generally unwell on this medication    Contact information for follow-up providers     Nigel Mormon, MD Follow up on 09/08/2022.   Specialties: Cardiology, Radiology Why: To be followed for Atrial fibrillation on 09/08/2022 at 3:00 PM Contact information: [place] [id number] [phone]         Kathy Borg, MD Follow up.   Specialties: Internal Medicine, Radiology Contact information: [place] [address] [phone]         Marshell Garfinkel, MD Follow up.   Specialty:  Pulmonary Disease Why: call for Janalynn Eder follow up apppointment Contact information: [address] [address] [place] [address] [phone]              Contact information for after-discharge care     [facility] .   Service: Skilled Nursing Contact information: [address] [place] [phone]                      The results of significant diagnostics from this hospitalization (including imaging, microbiology, ancillary and laboratory) are listed below for reference.    Significant Diagnostic Studies: DG CHEST PORT 1 VIEW  Result Date: 08/20/2022 CLINICAL DATA:  Cough. EXAM: PORTABLE CHEST 1 VIEW COMPARISON:  X-ray 08/13/2022 and CT FINDINGS: Enlarged cardiopericardial silhouette with calcified aorta. Diffuse interstitial changes are again seen, similar to previous. More confluence opacity right mid lung and left mid to lower lung are stable. No pneumothorax or effusion. Elevated right hemidiaphragm. Surgical clips overlie the right upper quadrant of the abdomen. Overlapping cardiac leads. Degenerative changes seen of the shoulder. IMPRESSION: No significant interval change when adjusting for technique Electronically Signed   By: Jill Side  M.D.   On: 08/20/2022 18:09   ECHO TEE  Result Date: 08/18/2022    TRANSESOPHOGEAL ECHO REPORT   Patient Name:   YISELA HAMMERSTROM Date of Exam: 08/18/2022 Medical Rec #:  [id number]  Height:       60.0 in Accession #:    [id number] Weight:       225.0 lb Date of Birth:  03/05/1941  BSA:          1.962 m Patient Age:    62 years   BP:           137/61 mmHg Patient Gender: F          HR:           82 bpm. Exam Location:  Inpatient Procedure: Transesophageal Echo, Color Doppler, Cardiac Doppler and Saline            Contrast Bubble Study Indications:     I48.91* Unspecified atrial fibrillation  History:         Patient has prior history of Echocardiogram examinations, most                  recent 02/18/2020. Arrythmias:Atrial Fibrillation; Risk                  Factors:Hypertension, Dyslipidemia and Sleep Apnea.  Sonographer:     Raquel Sarna Senior RDCS Referring Phys:  [id number] Rex Kras Diagnosing Phys: Rex Kras DO PROCEDURE: After discussion of the risks and benefits of Kamika Goodloe TEE, an informed consent was obtained from the patient. The transesophogeal probe was passed without difficulty through the esophogus of the patient. Imaged were obtained with the patient in Kamauri Kathol supine position. Local oropharyngeal anesthetic was provided with viscous lidocaine. Sedation performed by different physician. The patient was monitored while under deep sedation. Anesthestetic sedation was provided intravenously by Anesthesiology: '130mg'$  of Propofol. Image quality was good. The patient's vital signs; including heart rate, blood pressure, and oxygen saturation; remained stable throughout the procedure. Supplementary images were obtained from transthoracic windows as indicated to answer the clinical question. The patient developed no complications during the procedure. Rustin Erhart successful direct current cardioversion was performed at 200 joules with 1 attempt.  IMPRESSIONS  1. Left ventricular ejection fraction, by estimation,  is 50 to 55%. The left  ventricle has low normal function. The left ventricle has no regional wall motion abnormalities.  2. Right ventricular systolic function is normal. The right ventricular size is normal.  3. Left atrial size was moderately dilated. No left atrial/left atrial appendage thrombus was detected. The LAA emptying velocity was 26 cm/s.  4. Airyn Ellzey small pericardial effusion is present. The pericardial effusion is anterior to the right ventricle. There is no evidence of cardiac tamponade.  5. The mitral valve is degenerative. Mild mitral valve regurgitation. No evidence of mitral stenosis.  6. The aortic valve is tricuspid. Aortic valve regurgitation is not visualized. Aortic valve sclerosis is present, with no evidence of aortic valve stenosis.  7. There is mild (Grade II) plaque involving the descending aorta and ascending aorta.  8. Small PFO cannot be ruled out per color doppler. Agitated saline contrast bubble study was negative, with no evidence of any interatrial shunt.  9. Rhythm strip during this exam demonstrates atrial fibrillation. Comparison(s): TTE from 02/18/2020: LVEF 45-50%, indeterminate diastolic parameter, RVSP [id number], mild LAE, estimated RAP 21mHG. Conclusion(s)/Recommendation(s): No LA/LAA thrombus identified. Successful cardioversion performed with restoration of normal sinus rhythm. FINDINGS  Left Ventricle: Left ventricular ejection fraction, by estimation, is 50 to 55%. The left ventricle has low normal function. The left ventricle has no regional wall motion abnormalities. The left ventricular internal cavity size was normal in size. Right Ventricle: The right ventricular size is normal. No increase in right ventricular wall thickness. Right ventricular systolic function is normal. Left Atrium: Left atrial size was moderately dilated. Spontaneous echo contrast was present in the left atrium and left atrial appendage. No left atrial/left atrial appendage thrombus was detected. The LAA emptying velocity  was 26 cm/s. Right Atrium: Right atrial size was normal in size. Pericardium: Kailo Kosik small pericardial effusion is present. The pericardial effusion is anterior to the right ventricle. There is no evidence of cardiac tamponade. Mitral Valve: The mitral valve is degenerative in appearance. Mild mitral valve regurgitation. No evidence of mitral valve stenosis. Tricuspid Valve: The tricuspid valve is normal in structure. Tricuspid valve regurgitation is mild . No evidence of tricuspid stenosis. Aortic Valve: The aortic valve is tricuspid. Aortic valve regurgitation is not visualized. Aortic valve sclerosis is present, with no evidence of aortic valve stenosis. Pulmonic Valve: The pulmonic valve was grossly normal. Pulmonic valve regurgitation is mild. No evidence of pulmonic stenosis. Aorta: The aortic root and ascending aorta are structurally normal, with no evidence of dilitation. There is mild (Grade II) plaque involving the descending aorta and ascending aorta. IAS/Shunts: The interatrial septum appears to be lipomatous. Small PFO cannot be ruled out per color doppler. Agitated saline contrast was given intravenously to evaluate for intracardiac shunting. Agitated saline contrast bubble study was negative, with  no evidence of any interatrial shunt. EKG: Rhythm strip during this exam demonstrates atrial fibrillation. Additional Comments: Spectral Doppler performed. LEFT VENTRICLE PLAX 2D LVOT diam:     2.10 cm LVOT Area:     3.46 cm  LV Volumes (MOD) LV vol d, MOD A4C: 104.0 ml LV vol s, MOD A4C: 50.6 ml LV SV MOD A4C:     104.0 ml  SHUNTS Systemic Diam: 2.10 cm Sunit Tolia DO Electronically signed by SRex KrasDO Signature Date/Time: 08/18/2022/5:14:45 PM    Final    MR BRAIN WO CONTRAST  Result Date: 08/14/2022 CLINICAL DATA:  Altered mental status, cough EXAM: MRI HEAD WITHOUT CONTRAST TECHNIQUE: Multiplanar, multiecho pulse sequences of  the brain and surrounding structures were obtained without intravenous  contrast. COMPARISON:  07/25/2005 MRI head FINDINGS: Brain: No restricted diffusion to suggest acute or subacute infarct. No acute hemorrhage, mass, mass effect, or midline shift. No hydrocephalus or extra-axial collection. Normal craniocervical junction. No hemosiderin deposition to suggest remote hemorrhage. Dilated perivascular spaces in the bilateral basal ganglia. Scattered remote lacunar infarcts in the bilateral lentiform nuclei. Scattered T2 hyperintense signal in the periventricular white matter and pons, likely the sequela of moderate chronic small vessel ischemic disease. Vascular: Normal arterial flow voids. Skull and upper cervical spine: Normal marrow signal. Sinuses/Orbits: Clear paranasal sinuses. Status post bilateral lens replacements. Other: Fluid throughout the bilateral mastoid air cells. IMPRESSION: No acute intracranial process. No evidence of acute or subacute infarct. Electronically Signed   By: Merilyn Baba M.D.   On: 08/14/2022 20:54   CT CHEST ABDOMEN PELVIS W CONTRAST  Result Date: 08/14/2022 CLINICAL DATA:  Sepsis. EXAM: CT CHEST, ABDOMEN, AND PELVIS WITH CONTRAST TECHNIQUE: Multidetector CT imaging of the chest, abdomen and pelvis was performed following the standard protocol during bolus administration of intravenous contrast. RADIATION DOSE REDUCTION: This exam was performed according to the departmental dose-optimization program which includes automated exposure control, adjustment of the mA and/or kV according to patient size and/or use of iterative reconstruction technique. CONTRAST:  11m OMNIPAQUE IOHEXOL 350 MG/ML SOLN COMPARISON:  CT of the chest dated 02/08/2020. CT abdomen pelvis dated 05/23/2018. FINDINGS: Evaluation is limited due to streak artifact caused by patient's arms. CT CHEST FINDINGS Cardiovascular: Borderline cardiomegaly. No pericardial effusion. Coronary vascular calcification of the LAD. There is mild atherosclerotic calcification of the thoracic aorta.  No aneurysmal dilatation or dissection. The origins of the great vessels of the aortic arch and the central pulmonary arteries appear patent. Mediastinum/Nodes: No hilar or mediastinal adenopathy. The esophagus is grossly unremarkable. No mediastinal fluid collection. Lungs/Pleura: Bilateral interstitial coarsening progressed since the prior CT. Small triangular subpleural consolidation in the right upper lobe posteriorly may represent postobstructive atelectasis or infiltrate. Follow-up to resolution recommended. There is no pleural effusion or pneumothorax. The central airways remain patent. Musculoskeletal: Degenerative changes of the shoulders and spine. No acute osseous pathology. CT ABDOMEN PELVIS FINDINGS No intra-abdominal free air or free fluid. Hepatobiliary: Probable fatty liver. No biliary dilatation. Cholecystectomy. Pancreas: The pancreas is grossly unremarkable. Spleen: Normal in size without focal abnormality. Adrenals/Urinary Tract: The adrenal glands are unremarkable. There is no hydronephrosis on either side. There is symmetric enhancement and excretion of contrast by both kidneys. Bilateral extrarenal pelvis with pelviectasis. The visualized ureters and urinary bladder appear unremarkable. Stomach/Bowel: There is severe sigmoid diverticulosis without active inflammatory changes. There is no bowel obstruction or active inflammation. The appendix is normal. Vascular/Lymphatic: Mild aortoiliac atherosclerotic disease. The IVC is unremarkable. No portal venous gas. There is no adenopathy. Reproductive: Hysterectomy.  No adnexal masses. Other: Midline vertical anterior abdominal wall incisional scar. There is Davontae Prusinski ventral hernia repair mesh. Musculoskeletal: Degenerative changes of the spine. No acute osseous pathology. IMPRESSION: 1. 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 Small triangular subpleural consolidation in the right upper lobe posteriorly may represent  postobstructive atelectasis or infiltrate. Follow-up to resolution recommended. 2. No acute intra-abdominal or pelvic pathology. 3. Severe sigmoid diverticulosis. No bowel obstruction. Normal appendix. 4.  Aortic Atherosclerosis (ICD10-I70.0). Electronically Signed   By: AAnner CreteM.D.   On: 08/14/2022 01:43   DG Knee AP/LAT W/Sunrise Left  Result Date: 08/13/2022 CLINICAL DATA:  Chronic bilateral knee pain EXAM: LEFT  KNEE 3 VIEWS; RIGHT KNEE 3 VIEWS COMPARISON:  None Available. FINDINGS: No fracture or dislocation of the bilateral knees. 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 No knee joint effusion. Diffuse soft tissue edema. IMPRESSION: 1. No fracture or dislocation of the bilateral knees. 2. 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 No knee joint effusion. 3. Diffuse soft tissue edema. Electronically Signed   By: Delanna Ahmadi M.D.   On: 08/13/2022 16:43   DG Knee AP/LAT W/Sunrise Right  Result Date: 08/13/2022 CLINICAL DATA:  Chronic bilateral knee pain EXAM: LEFT KNEE 3 VIEWS; RIGHT KNEE 3 VIEWS COMPARISON:  None Available. FINDINGS: No fracture or dislocation of the bilateral knees. 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 No knee joint effusion. Diffuse soft tissue edema. IMPRESSION: 1. No fracture or dislocation of the bilateral knees. 2. 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 No knee joint effusion. 3. Diffuse soft tissue edema. Electronically Signed   By: Delanna Ahmadi M.D.   On: 08/13/2022 16:43   DG Chest Portable 1 View  Result Date: 08/13/2022 CLINICAL DATA:  Altered mental status EXAM: PORTABLE CHEST 1 VIEW COMPARISON:  05/31/2022 FINDINGS: Rotated AP portable examination. Cardiomegaly. Diffuse bilateral interstitial pulmonary opacity. The visualized skeletal structures are unremarkable. IMPRESSION: Rotated AP portable examination.  Cardiomegaly with diffuse bilateral interstitial pulmonary opacity, likely edema. No focal airspace opacity. Electronically Signed   By: Delanna Ahmadi M.D.   On: 08/13/2022 16:41    Microbiology: Recent Results (from the past 240 hour(s))  Resp panel by RT-PCR (RSV, Flu Bryar Rennie&B, Covid) Anterior Nasal Swab     Status: None   Collection Time: 08/13/22  4:19 PM   Specimen: Anterior Nasal Swab  Result Value Ref Range Status   SARS Coronavirus 2 by RT PCR NEGATIVE NEGATIVE Final   Influenza Mcguire Gasparyan by PCR NEGATIVE NEGATIVE Final   Influenza B by PCR NEGATIVE NEGATIVE Final    Comment: (NOTE) The Xpert Xpress SARS-CoV-2/FLU/RSV plus assay is intended as an aid in the diagnosis of influenza from Nasopharyngeal swab specimens and should not be used as Emerita Berkemeier sole basis for treatment. Nasal washings and aspirates are unacceptable for Xpert Xpress SARS-CoV-2/FLU/RSV testing.  Fact Sheet for Patients: EntrepreneurPulse.com.au  Fact Sheet for Healthcare Providers: [employer]redible[employer]ployment[employer]  This test is not yet approved or cleared by the [place] FDA and has been authorized for detection and/or diagnosis of SARS-CoV-2 by FDA under an Emergency Use Authorization (EUA). This EUA will remain in effect (meaning this test can be used) for the duration of the COVID-19 declaration under Section 564(b)(1) of the Act, 21 U.S.C. section 360bbb-3(b)(1), unless the authorization is terminated or revoked.     Resp Syncytial Virus by PCR NEGATIVE NEGATIVE Final    Comment: (NOTE) Fact Sheet for Patients: EntrepreneurPulse.com.au  Fact Sheet for Healthcare Providers: [employer]redibleEmployment[employer]be  This test is not yet approved or cleared by the [place] FDA and has been authorized for detection and/or diagnosis of SARS-CoV-2 by FDA under an Emergency Use Authorization (EUA). This EUA will remain in effect (meaning this test can be used) for  the duration of the COVID-19 declaration under Section 564(b)(1) of the Act, 21 U.S.C. section 360bbb-3(b)(1), unless the authorization is terminated or revoked.  Performed at [facility], [place] [address], [place], [place] [address]   Blood culture (routine x 2)     Status: None   Collection Time: 08/13/22  5:52 PM   Specimen: BLOOD  Result Value Ref Range Status  Specimen Description BLOOD LEFT ANTECUBITAL  Final   Special Requests   Final    BOTTLES DRAWN AEROBIC AND ANAEROBIC Blood Culture results may not be optimal due to an inadequate volume of blood received in culture bottles   Culture   Final    NO GROWTH 5 DAYS Performed at [facility], [place] [address], [place], [place] [address]    Report Status 08/18/2022 FINAL  Final  Blood culture (routine x 2)     Status: None   Collection Time: 08/13/22  5:57 PM   Specimen: BLOOD  Result Value Ref Range Status   Specimen Description BLOOD RIGHT ANTECUBITAL  Final   Special Requests   Final    BOTTLES DRAWN AEROBIC AND ANAEROBIC Blood Culture results may not be optimal due to an inadequate volume of blood received in culture bottles   Culture   Final    NO GROWTH 5 DAYS Performed at [facility], [place] [address] [place], [place] [address]    Report Status 08/18/2022 FINAL  Final  Resp panel by RT-PCR (RSV, Flu Carianne Taira&B, Covid) Anterior Nasal Swab     Status: None   Collection Time: 08/19/22  3:04 PM   Specimen: Anterior Nasal Swab  Result Value Ref Range Status   SARS Coronavirus 2 by RT PCR NEGATIVE NEGATIVE Final   Influenza Suetta Hoffmeister by PCR NEGATIVE NEGATIVE Final   Influenza B by PCR NEGATIVE NEGATIVE Final    Comment: (NOTE) The Xpert Xpress SARS-CoV-2/FLU/RSV plus assay is intended as an aid in the diagnosis of influenza from Nasopharyngeal swab specimens and should not be used as Versie Fleener sole basis for treatment. Nasal washings and aspirates are unacceptable for Xpert Xpress SARS-CoV-2/FLU/RSV testing.  Fact Sheet  for Patients: EntrepreneurPulse.com.au  Fact Sheet for Healthcare Providers: IncredibleEmployment.be  This test is not yet approved or cleared by the [place] FDA and has been authorized for detection and/or diagnosis of SARS-CoV-2 by FDA under an Emergency Use Authorization (EUA). This EUA will remain in effect (meaning this test can be used) for the duration of the COVID-19 declaration under Section 564(b)(1) of the Act, 21 U.S.C. section 360bbb-3(b)(1), unless the authorization is terminated or revoked.     Resp Syncytial Virus by PCR NEGATIVE NEGATIVE Final    Comment: (NOTE) Fact Sheet for Patients: EntrepreneurPulse.com.au  Fact Sheet for Healthcare Providers: IncredibleEmployment[employer]be  This test is not yet approved or cleared by the [place] FDA and has been authorized for detection and/or diagnosis of SARS-CoV-2 by FDA under an Emergency Use Authorization (EUA). This EUA will remain in effect (meaning this test can be used) for the duration of the COVID-19 declaration under Section 564(b)(1) of the Act, 21 U.S.C. section 360bbb-3(b)(1), unless the authorization is terminated or revoked.  Performed at [facility], [place] [address] [place], [place] [address]      Labs: Basic Metabolic Panel: Recent Labs  Lab 08/17/22 0251 08/18/22 0358 08/19/22 0317 08/20/22 0335 08/21/22 0218  NA 137 134* 138 140 139  K 4.9 4.3 4.3 4.4 4.5  CL 97* 94* 97* 98 98  CO2 32 32 35* 34* 34*  GLUCOSE 98 132* 117* 103* 112*  BUN 30* 26* '21 21 17  '$ CREATININE 1.12* 1.03* 1.01* 0.99 1.10*  CALCIUM 8.9 8.8* 8.7* 9.0 8.7*  MG 2.1 2.1 2.1 2.2 2.1  PHOS  --   --  3.1 2.9 3.4   Liver Function Tests: Recent Labs  Lab 08/15/22 0806 08/20/22 0335  AST 33 23  ALT 23 20  ALKPHOS 49 41  BILITOT 0.5 0.9  PROT 7.2 5.8*  ALBUMIN 3.0* 2.4*   No results for input(s): "LIPASE", "AMYLASE" in the last 168  hours. No results for input(s): "AMMONIA" in the last 168 hours. CBC: Recent Labs  Lab 08/17/22 0251 08/18/22 0358 08/19/22 0317 08/20/22 0335 08/21/22 0218  WBC 12.9* 11.9* 9.8 8.6 8.0  NEUTROABS  --   --   --  5.6  --   HGB 10.9* 10.5* 9.9* 10.0* 10.7*  HCT 34.8* 32.6* 29.8* 30.4* 34.3*  MCV 103.3* 100.6* 101.7* 101.0* 103.3*  PLT 189 194 188 188 190   Cardiac Enzymes: No results for input(s): "CKTOTAL", "CKMB", "CKMBINDEX", "TROPONINI" in the last 168 hours. BNP: BNP (last 3 results) Recent Labs    08/13/22 1619  BNP 164.2*    ProBNP (last 3 results) Recent Labs    12/11/21 1456  PROBNP 143.0*    CBG: No results for input(s): "GLUCAP" in the last 168 hours.     Signed:  Fayrene Helper MD.  [facility] 08/21/2022, 11:47 AM

## 2022-08-22 ENCOUNTER — Encounter (HOSPITAL_COMMUNITY): Payer: Self-pay | Admitting: Cardiology

## 2022-08-23 DIAGNOSIS — K2100 Gastro-esophageal reflux disease with esophagitis, without bleeding: Secondary | ICD-10-CM | POA: Diagnosis not present

## 2022-08-23 DIAGNOSIS — N390 Urinary tract infection, site not specified: Secondary | ICD-10-CM | POA: Diagnosis not present

## 2022-08-23 DIAGNOSIS — J188 Other pneumonia, unspecified organism: Secondary | ICD-10-CM | POA: Diagnosis not present

## 2022-08-23 DIAGNOSIS — M6281 Muscle weakness (generalized): Secondary | ICD-10-CM | POA: Diagnosis not present

## 2022-08-23 DIAGNOSIS — I480 Paroxysmal atrial fibrillation: Secondary | ICD-10-CM | POA: Diagnosis not present

## 2022-08-23 DIAGNOSIS — J8410 Pulmonary fibrosis, unspecified: Secondary | ICD-10-CM | POA: Diagnosis not present

## 2022-08-23 DIAGNOSIS — G9341 Metabolic encephalopathy: Secondary | ICD-10-CM | POA: Diagnosis not present

## 2022-08-23 DIAGNOSIS — N183 Chronic kidney disease, stage 3 unspecified: Secondary | ICD-10-CM | POA: Diagnosis not present

## 2022-08-24 ENCOUNTER — Other Ambulatory Visit: Payer: Self-pay | Admitting: *Deleted

## 2022-08-24 ENCOUNTER — Other Ambulatory Visit: Payer: Medicare Other

## 2022-08-24 DIAGNOSIS — F32A Depression, unspecified: Secondary | ICD-10-CM | POA: Diagnosis not present

## 2022-08-24 DIAGNOSIS — G9341 Metabolic encephalopathy: Secondary | ICD-10-CM | POA: Diagnosis not present

## 2022-08-24 DIAGNOSIS — M6281 Muscle weakness (generalized): Secondary | ICD-10-CM | POA: Diagnosis not present

## 2022-08-24 DIAGNOSIS — N183 Chronic kidney disease, stage 3 unspecified: Secondary | ICD-10-CM | POA: Diagnosis not present

## 2022-08-24 DIAGNOSIS — I4891 Unspecified atrial fibrillation: Secondary | ICD-10-CM | POA: Diagnosis not present

## 2022-08-24 DIAGNOSIS — J8410 Pulmonary fibrosis, unspecified: Secondary | ICD-10-CM | POA: Diagnosis not present

## 2022-08-24 DIAGNOSIS — N390 Urinary tract infection, site not specified: Secondary | ICD-10-CM | POA: Diagnosis not present

## 2022-08-24 DIAGNOSIS — J188 Other pneumonia, unspecified organism: Secondary | ICD-10-CM | POA: Diagnosis not present

## 2022-08-24 DIAGNOSIS — R2681 Unsteadiness on feet: Secondary | ICD-10-CM | POA: Diagnosis not present

## 2022-08-24 DIAGNOSIS — J9611 Chronic respiratory failure with hypoxia: Secondary | ICD-10-CM | POA: Diagnosis not present

## 2022-08-24 DIAGNOSIS — K2100 Gastro-esophageal reflux disease with esophagitis, without bleeding: Secondary | ICD-10-CM | POA: Diagnosis not present

## 2022-08-24 DIAGNOSIS — I480 Paroxysmal atrial fibrillation: Secondary | ICD-10-CM | POA: Diagnosis not present

## 2022-08-24 NOTE — Patient Outreach (Signed)
Per [facility] Kathy [name] resides in [facility] skilled nursing facility. Screening for potential [facility] care coordination services as benefit of health plan and PCP.   Secure communication sent to [place] social worker to make aware writer is following for transition plans and potential [facility] needs.  Marthenia Rolling, MSN, RN,BSN [facility] Coordinator [phone] (Direct dial)

## 2022-08-25 ENCOUNTER — Ambulatory Visit (HOSPITAL_COMMUNITY): Payer: Self-pay | Admitting: Psychiatry

## 2022-08-25 DIAGNOSIS — G9341 Metabolic encephalopathy: Secondary | ICD-10-CM | POA: Diagnosis not present

## 2022-08-25 DIAGNOSIS — N390 Urinary tract infection, site not specified: Secondary | ICD-10-CM | POA: Diagnosis not present

## 2022-08-25 DIAGNOSIS — J8410 Pulmonary fibrosis, unspecified: Secondary | ICD-10-CM | POA: Diagnosis not present

## 2022-08-25 DIAGNOSIS — N183 Chronic kidney disease, stage 3 unspecified: Secondary | ICD-10-CM | POA: Diagnosis not present

## 2022-08-25 DIAGNOSIS — J188 Other pneumonia, unspecified organism: Secondary | ICD-10-CM | POA: Diagnosis not present

## 2022-08-25 DIAGNOSIS — K2100 Gastro-esophageal reflux disease with esophagitis, without bleeding: Secondary | ICD-10-CM | POA: Diagnosis not present

## 2022-08-25 DIAGNOSIS — I480 Paroxysmal atrial fibrillation: Secondary | ICD-10-CM | POA: Diagnosis not present

## 2022-08-25 DIAGNOSIS — M6281 Muscle weakness (generalized): Secondary | ICD-10-CM | POA: Diagnosis not present

## 2022-08-26 DIAGNOSIS — N390 Urinary tract infection, site not specified: Secondary | ICD-10-CM | POA: Diagnosis not present

## 2022-08-26 DIAGNOSIS — J8410 Pulmonary fibrosis, unspecified: Secondary | ICD-10-CM | POA: Diagnosis not present

## 2022-08-26 DIAGNOSIS — K2100 Gastro-esophageal reflux disease with esophagitis, without bleeding: Secondary | ICD-10-CM | POA: Diagnosis not present

## 2022-08-26 DIAGNOSIS — N183 Chronic kidney disease, stage 3 unspecified: Secondary | ICD-10-CM | POA: Diagnosis not present

## 2022-08-26 DIAGNOSIS — M6281 Muscle weakness (generalized): Secondary | ICD-10-CM | POA: Diagnosis not present

## 2022-08-26 DIAGNOSIS — G9341 Metabolic encephalopathy: Secondary | ICD-10-CM | POA: Diagnosis not present

## 2022-08-26 DIAGNOSIS — J188 Other pneumonia, unspecified organism: Secondary | ICD-10-CM | POA: Diagnosis not present

## 2022-08-26 DIAGNOSIS — I480 Paroxysmal atrial fibrillation: Secondary | ICD-10-CM | POA: Diagnosis not present

## 2022-08-30 DIAGNOSIS — K5909 Other constipation: Secondary | ICD-10-CM | POA: Diagnosis not present

## 2022-08-30 DIAGNOSIS — N390 Urinary tract infection, site not specified: Secondary | ICD-10-CM | POA: Diagnosis not present

## 2022-08-30 DIAGNOSIS — M6281 Muscle weakness (generalized): Secondary | ICD-10-CM | POA: Diagnosis not present

## 2022-08-30 DIAGNOSIS — N183 Chronic kidney disease, stage 3 unspecified: Secondary | ICD-10-CM | POA: Diagnosis not present

## 2022-08-30 DIAGNOSIS — J188 Other pneumonia, unspecified organism: Secondary | ICD-10-CM | POA: Diagnosis not present

## 2022-08-30 DIAGNOSIS — K2100 Gastro-esophageal reflux disease with esophagitis, without bleeding: Secondary | ICD-10-CM | POA: Diagnosis not present

## 2022-08-30 DIAGNOSIS — G9341 Metabolic encephalopathy: Secondary | ICD-10-CM | POA: Diagnosis not present

## 2022-08-30 DIAGNOSIS — I480 Paroxysmal atrial fibrillation: Secondary | ICD-10-CM | POA: Diagnosis not present

## 2022-08-30 DIAGNOSIS — J8410 Pulmonary fibrosis, unspecified: Secondary | ICD-10-CM | POA: Diagnosis not present

## 2022-08-31 DIAGNOSIS — F32A Depression, unspecified: Secondary | ICD-10-CM | POA: Diagnosis not present

## 2022-08-31 DIAGNOSIS — M6281 Muscle weakness (generalized): Secondary | ICD-10-CM | POA: Diagnosis not present

## 2022-08-31 DIAGNOSIS — J9611 Chronic respiratory failure with hypoxia: Secondary | ICD-10-CM | POA: Diagnosis not present

## 2022-08-31 DIAGNOSIS — I4891 Unspecified atrial fibrillation: Secondary | ICD-10-CM | POA: Diagnosis not present

## 2022-08-31 DIAGNOSIS — R2681 Unsteadiness on feet: Secondary | ICD-10-CM | POA: Diagnosis not present

## 2022-09-01 DIAGNOSIS — N390 Urinary tract infection, site not specified: Secondary | ICD-10-CM | POA: Diagnosis not present

## 2022-09-01 DIAGNOSIS — N183 Chronic kidney disease, stage 3 unspecified: Secondary | ICD-10-CM | POA: Diagnosis not present

## 2022-09-01 DIAGNOSIS — M6281 Muscle weakness (generalized): Secondary | ICD-10-CM | POA: Diagnosis not present

## 2022-09-01 DIAGNOSIS — I480 Paroxysmal atrial fibrillation: Secondary | ICD-10-CM | POA: Diagnosis not present

## 2022-09-01 DIAGNOSIS — K5909 Other constipation: Secondary | ICD-10-CM | POA: Diagnosis not present

## 2022-09-01 DIAGNOSIS — J8410 Pulmonary fibrosis, unspecified: Secondary | ICD-10-CM | POA: Diagnosis not present

## 2022-09-01 DIAGNOSIS — G9341 Metabolic encephalopathy: Secondary | ICD-10-CM | POA: Diagnosis not present

## 2022-09-01 DIAGNOSIS — J188 Other pneumonia, unspecified organism: Secondary | ICD-10-CM | POA: Diagnosis not present

## 2022-09-01 DIAGNOSIS — K2100 Gastro-esophageal reflux disease with esophagitis, without bleeding: Secondary | ICD-10-CM | POA: Diagnosis not present

## 2022-09-03 DIAGNOSIS — M6281 Muscle weakness (generalized): Secondary | ICD-10-CM | POA: Diagnosis not present

## 2022-09-03 DIAGNOSIS — I480 Paroxysmal atrial fibrillation: Secondary | ICD-10-CM | POA: Diagnosis not present

## 2022-09-03 DIAGNOSIS — F32A Depression, unspecified: Secondary | ICD-10-CM | POA: Diagnosis not present

## 2022-09-03 DIAGNOSIS — J9611 Chronic respiratory failure with hypoxia: Secondary | ICD-10-CM | POA: Diagnosis not present

## 2022-09-03 DIAGNOSIS — K2100 Gastro-esophageal reflux disease with esophagitis, without bleeding: Secondary | ICD-10-CM | POA: Diagnosis not present

## 2022-09-03 DIAGNOSIS — J188 Other pneumonia, unspecified organism: Secondary | ICD-10-CM | POA: Diagnosis not present

## 2022-09-03 DIAGNOSIS — N183 Chronic kidney disease, stage 3 unspecified: Secondary | ICD-10-CM | POA: Diagnosis not present

## 2022-09-03 DIAGNOSIS — I4891 Unspecified atrial fibrillation: Secondary | ICD-10-CM | POA: Diagnosis not present

## 2022-09-03 DIAGNOSIS — N390 Urinary tract infection, site not specified: Secondary | ICD-10-CM | POA: Diagnosis not present

## 2022-09-03 DIAGNOSIS — J8410 Pulmonary fibrosis, unspecified: Secondary | ICD-10-CM | POA: Diagnosis not present

## 2022-09-03 DIAGNOSIS — R2681 Unsteadiness on feet: Secondary | ICD-10-CM | POA: Diagnosis not present

## 2022-09-03 DIAGNOSIS — G9341 Metabolic encephalopathy: Secondary | ICD-10-CM | POA: Diagnosis not present

## 2022-09-06 NOTE — Telephone Encounter (Signed)
Pt ready for scheduling Zilretta inj (also ran benefits for Gelsyn-3)   Primary: Medicare [place] co-insurance: 20% (approximately $302) Admin fee co-insurance: 20% (approximately $25)  Deductible: $0 of $240 met  Secondary: C[employer] Medicare Supp Plan F [place] co-insurance: Covers Medicare Part B co-insurance Admin fee co-insurance: Covers Medicare Part B co-insurance  Deductible:  Covered by secondary  Prior Auth: NOT required PA# Valid:   ** This summary of benefits is an estimation of the patient's out-of-pocket cost. Exact cost may vary based on individual plan coverage.

## 2022-09-07 DIAGNOSIS — F32A Depression, unspecified: Secondary | ICD-10-CM | POA: Diagnosis not present

## 2022-09-07 DIAGNOSIS — M6281 Muscle weakness (generalized): Secondary | ICD-10-CM | POA: Diagnosis not present

## 2022-09-07 DIAGNOSIS — J9611 Chronic respiratory failure with hypoxia: Secondary | ICD-10-CM | POA: Diagnosis not present

## 2022-09-07 DIAGNOSIS — J189 Pneumonia, unspecified organism: Secondary | ICD-10-CM | POA: Diagnosis not present

## 2022-09-07 DIAGNOSIS — I4891 Unspecified atrial fibrillation: Secondary | ICD-10-CM | POA: Diagnosis not present

## 2022-09-07 DIAGNOSIS — R2681 Unsteadiness on feet: Secondary | ICD-10-CM | POA: Diagnosis not present

## 2022-09-07 NOTE — Telephone Encounter (Signed)
Spoke to patient's daughter. She said that the patient is currently in [facility] and dealing with some other issues. She would like to hold off for now but will touch base with [place] soon.

## 2022-09-07 NOTE — Telephone Encounter (Signed)
Tried to call patient. No answer and unable to leave a voicemail.

## 2022-09-08 ENCOUNTER — Encounter: Payer: Self-pay | Admitting: Cardiology

## 2022-09-08 ENCOUNTER — Ambulatory Visit: Payer: Medicare Other | Admitting: Cardiology

## 2022-09-08 VITALS — BP 118/52 | HR 53 | Resp 16 | Ht 60.0 in | Wt 216.0 lb

## 2022-09-08 DIAGNOSIS — I272 Pulmonary hypertension, unspecified: Secondary | ICD-10-CM

## 2022-09-08 DIAGNOSIS — I4819 Other persistent atrial fibrillation: Secondary | ICD-10-CM

## 2022-09-08 DIAGNOSIS — I10 Essential (primary) hypertension: Secondary | ICD-10-CM

## 2022-09-08 DIAGNOSIS — I5020 Unspecified systolic (congestive) heart failure: Secondary | ICD-10-CM

## 2022-09-08 NOTE — Progress Notes (Unsigned)
Patient referred by Biagio Borg, MD for pulmonary hypertension  Subjective:   Kathy [name], female    DOB: 1941/02/08, 82 y.o.   MRN: [id number]   No chief complaint on file.     HPI  82 y.o. Caucasian female with hypertension, hyperlipidemia, interstitial lung disease, on 2 L O2, h/o COVID 06/2019, persistent Afib, pulmonary hypertension  Patient is doing well today. Denies more than usual dyspnea. Blood pressure better controlled.   Initial consultation HPI 06/2020: Patient has been on 2 L oxygen for at least 1-2 years. COVID-19 in 2021 probably caused progression of her baseline ILD, according to Dr. Vaughan Browner. Currently, she is limited to walking inside the house with stable exertional dyspnea. She denies any chest pain. She was noted to be in Afib on echocardiogram in 01/2020, although I do not see any prior EKG's. Echocardiogram showed LVEF 45-50%, mild LA dilatation, estimated PASP 36 mmHg. She reports occasional lightheadedness, but denies any presyncope, syncope, chest pain. Leg edema is currently well controlled. She denies orthopnea, PND.     Current Outpatient Medications:    albuterol (VENTOLIN HFA) 108 (90 Base) MCG/ACT inhaler, Inhale 1-2 puffs into the lungs every 6 (six) hours as needed for wheezing or shortness of breath., Disp: 1 each, Rfl: 11   amitriptyline (ELAVIL) 50 MG tablet, Take 1 tablet (50 mg total) by mouth at bedtime as needed for sleep. And neuropathy, Disp: , Rfl:    apixaban (ELIQUIS) 5 MG TABS tablet, Take 1 tablet (5 mg total) by mouth 2 (two) times daily., Disp: 180 tablet, Rfl: 3   Cholecalciferol (VITAMIN D-3) 25 MCG (1000 UT) CAPS, Take 1 capsule by mouth daily., Disp: , Rfl:    cyanocobalamin (VITAMIN B12) 1000 MCG/ML injection, INJECT 1 ML INTO MUSCLE EVERY 30 DAYS, Disp: 3 mL, Rfl: 1   diltiazem (CARDIZEM SR) 90 MG 12 hr capsule, Take 1 capsule (90 mg total) by mouth every 12 (twelve) hours., Disp: 60 capsule, Rfl: 0   pantoprazole (PROTONIX) 40  MG tablet, TAKE 1 TABLET BY MOUTH EVERY DAY, Disp: 90 tablet, Rfl: 3   Respiratory Therapy Supplies (FLUTTER) DEVI, Use as directed, Disp: 1 each, Rfl: 0   sertraline (ZOLOFT) 25 MG tablet, Take 1 tablet (25 mg total) by mouth daily., Disp: 30 tablet, Rfl: 0   sotalol (BETAPACE) 80 MG tablet, Take 0.5 tablets (40 mg total) by mouth every 12 (twelve) hours., Disp: 30 tablet, Rfl: 0   triamcinolone cream (KENALOG) 0.5 %, Apply 1 Application topically 2 (two) times daily as needed. (Patient taking differently: Apply 1 Application topically 2 (two) times daily as needed (for itching).), Disp: 454 g, Rfl: 1  Cardiovascular and other pertinent studies:  EKG 09/08/2022: Sinus rhythm 56 bpm Normal EKG   TEE/cardioversion 08/18/2022  TEE 08/18/2022:  1. Left ventricular ejection fraction, by estimation, is 50 to 55%. The  left ventricle has low normal function. The left ventricle has no regional  wall motion abnormalities.   2. Right ventricular systolic function is normal. The right ventricular  size is normal.   3. Left atrial size was moderately dilated. No left atrial/left atrial  appendage thrombus was detected. The LAA emptying velocity was 26 cm/s.   4. A small pericardial effusion is present. The pericardial effusion is  anterior to the right ventricle. There is no evidence of cardiac  tamponade.   5. The mitral valve is degenerative. Mild mitral valve regurgitation. No  evidence of mitral stenosis.   6. The  aortic valve is tricuspid. Aortic valve regurgitation is not  visualized. Aortic valve sclerosis is present, with no evidence of aortic  valve stenosis.   7. There is mild (Grade II) plaque involving the descending aorta and  ascending aorta.   8. Small PFO cannot be ruled out per color doppler. Agitated saline  contrast bubble study was negative, with no evidence of any interatrial  shunt.   9. Rhythm strip during this exam demonstrates atrial fibrillation.   Comparison(s): TTE  from 02/18/2020: LVEF 45-50%, indeterminate diastolic  parameter, RVSP 36.29mHG, mild LAE, estimated RAP 820mG.   Conclusion(s)/Recommendation(s): No LA/LAA thrombus identified. Successful  cardioversion performed with restoration of normal sinus rhythm.    EKG 11/03/2020: Atrial fibrillation 116 bpm  Possible old anteroseptal infarct Nonspecific T-abnormality  EKG 08/04/2020: Atrial fibrillation 111 bpm Low voltage in precordial leads  RSR(V1) -nondiagnostic Nonspecific T-abnormality  EKG 07/11/2020: Atrial fibrillation 120 bpm Nonspecific T-abnormality  Echocardiogram 02/18/2020: 1. Left ventricular ejection fraction, by estimation, is 45 to 50%. The  left ventricle has mildly decreased function. The left ventricle  demonstrates global hypokinesis. Left ventricular diastolic parameters are  indeterminate.   2. Right ventricular systolic function is normal. The right ventricular  size is normal. There is mildly elevated pulmonary artery systolic  pressure. The estimated right ventricular systolic pressure is 3699991111mHg.   3. Left atrial size was mildly dilated.   4. The mitral valve is normal in structure. No evidence of mitral valve  regurgitation. No evidence of mitral stenosis.   5. The aortic valve is tricuspid. Aortic valve regurgitation is not  visualized. Mild aortic valve sclerosis is present, with no evidence of  aortic valve stenosis.   6. The inferior vena cava is normal in size with <50% respiratory  variability, suggesting right atrial pressure of 8 mmHg.   7. The patient appeared to be in atrial fibrillation with mild RVR.   CT Chest 02/08/2020: 1. 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 Findings may be minimally progressive from 01/11/2018 and the possibility of superimposed post COVID-19 inflammatory fibrosis cannot be excluded. Findings are suggestive of an alternative diagnosis (not UIP) per  consensus guidelines: Diagnosis of Idiopathic Pulmonary Fibrosis: An Official ATS/ERS/JRS/ALAT Clinical Practice Guideline. AmAmbroseIss 5, [id number] 1 2018. 2. Aortic atherosclerosis (ICD10-I70.0). Coronary artery calcification. 3. Enlarged pulmonic trunk, indicative of pulmonary arterial hypertension.     Recent labs: 08/01/2022: Glucose 112, BUN/Cr 17/1.1. EGFR 50. Na/K 139/4.5.  H/H 10.7/34.3. MCV 103. Platelets 190 HbA1C NA Chol 149, TG 77, HDL 55, LDL 78 TSH NA  12/18/2019: Glucose 94, BUN/Cr 24/1.19. EGFR 43. Na/K 138/4.0. Rest of the CMP normal H/H 11/34. MCV 96. Platelets 216 Chol 130, TG 74, HDL 47, LDL 68    ROS       Vitals:   09/08/22 1503  BP: (!) 118/52  Pulse: (!) 53  Resp: 16    Body mass index is 42.18 kg/m. Filed Weights   09/08/22 1503  Weight: 216 lb (98 kg)      Objective:   Physical Exam Cardiovascular:     Rate and Rhythm: Tachycardia present. Rhythm irregular.         Assessment & Recommendations:   [age].o. Caucasian female with hypertension, hyperlipidemia, interstitial lung disease, on 2 L O2, h/o COVID 06/2019, persistent Afib, pulmonary hypertension   Persistent Afib: Rate around 100-110 bpm, which is acceptable. Continue metoprolol succinate 50 mg daily,  and diltiazem 120 mg daily. I encouraged her to use a smart watch or device such as cardia to monitor her A. fib rate at home. I will see her again in 4 weeks to reevaluate refill rate controlled. CHA2DS2VASc score 4, annual stroke risk 5%.  Continue eliquis 5 mg bid.  Patient assistance information provided to the patient.   Although she has coronary atherosclerosis, she does not have any angina symptoms at this time, thus do not recommend ischemia testing.   HFrEF: Mildly reduced EF 45-50%. Clinically euvolumic. Probably related to Afib. Conitnue losartan, metoprolol.  Continue lasix 40 mg a day.   Pulmonary hypertension: Modestly  elevated of PASP 36 mmHg on echocardiogram 01/2020. 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 Recommend continued management of underlying medical comorbidities. Low suspicion for WHO grp I PH/PAH.  Hypertension: Controlled  F/u in 6 months    Nigel Mormon, MD Pager: [phone] Office: [phone]

## 2022-09-09 ENCOUNTER — Encounter: Payer: Self-pay | Admitting: Cardiology

## 2022-09-10 DIAGNOSIS — J9611 Chronic respiratory failure with hypoxia: Secondary | ICD-10-CM | POA: Diagnosis not present

## 2022-09-10 DIAGNOSIS — R2681 Unsteadiness on feet: Secondary | ICD-10-CM | POA: Diagnosis not present

## 2022-09-10 DIAGNOSIS — I4891 Unspecified atrial fibrillation: Secondary | ICD-10-CM | POA: Diagnosis not present

## 2022-09-10 DIAGNOSIS — J189 Pneumonia, unspecified organism: Secondary | ICD-10-CM | POA: Diagnosis not present

## 2022-09-10 DIAGNOSIS — F32A Depression, unspecified: Secondary | ICD-10-CM | POA: Diagnosis not present

## 2022-09-10 DIAGNOSIS — M6281 Muscle weakness (generalized): Secondary | ICD-10-CM | POA: Diagnosis not present

## 2022-09-13 DIAGNOSIS — R635 Abnormal weight gain: Secondary | ICD-10-CM | POA: Diagnosis not present

## 2022-09-13 DIAGNOSIS — G9341 Metabolic encephalopathy: Secondary | ICD-10-CM | POA: Diagnosis not present

## 2022-09-13 DIAGNOSIS — I480 Paroxysmal atrial fibrillation: Secondary | ICD-10-CM | POA: Diagnosis not present

## 2022-09-13 DIAGNOSIS — M6281 Muscle weakness (generalized): Secondary | ICD-10-CM | POA: Diagnosis not present

## 2022-09-13 DIAGNOSIS — N390 Urinary tract infection, site not specified: Secondary | ICD-10-CM | POA: Diagnosis not present

## 2022-09-13 DIAGNOSIS — J8410 Pulmonary fibrosis, unspecified: Secondary | ICD-10-CM | POA: Diagnosis not present

## 2022-09-13 DIAGNOSIS — N183 Chronic kidney disease, stage 3 unspecified: Secondary | ICD-10-CM | POA: Diagnosis not present

## 2022-09-13 DIAGNOSIS — J188 Other pneumonia, unspecified organism: Secondary | ICD-10-CM | POA: Diagnosis not present

## 2022-09-13 DIAGNOSIS — K2100 Gastro-esophageal reflux disease with esophagitis, without bleeding: Secondary | ICD-10-CM | POA: Diagnosis not present

## 2022-09-14 DIAGNOSIS — J9611 Chronic respiratory failure with hypoxia: Secondary | ICD-10-CM | POA: Diagnosis not present

## 2022-09-14 DIAGNOSIS — R2681 Unsteadiness on feet: Secondary | ICD-10-CM | POA: Diagnosis not present

## 2022-09-14 DIAGNOSIS — M6281 Muscle weakness (generalized): Secondary | ICD-10-CM | POA: Diagnosis not present

## 2022-09-14 DIAGNOSIS — F32A Depression, unspecified: Secondary | ICD-10-CM | POA: Diagnosis not present

## 2022-09-14 DIAGNOSIS — I4891 Unspecified atrial fibrillation: Secondary | ICD-10-CM | POA: Diagnosis not present

## 2022-09-14 DIAGNOSIS — J189 Pneumonia, unspecified organism: Secondary | ICD-10-CM | POA: Diagnosis not present

## 2022-09-17 DIAGNOSIS — R2681 Unsteadiness on feet: Secondary | ICD-10-CM | POA: Diagnosis not present

## 2022-09-17 DIAGNOSIS — M6281 Muscle weakness (generalized): Secondary | ICD-10-CM | POA: Diagnosis not present

## 2022-09-17 DIAGNOSIS — J9611 Chronic respiratory failure with hypoxia: Secondary | ICD-10-CM | POA: Diagnosis not present

## 2022-09-17 DIAGNOSIS — F32A Depression, unspecified: Secondary | ICD-10-CM | POA: Diagnosis not present

## 2022-09-17 DIAGNOSIS — J189 Pneumonia, unspecified organism: Secondary | ICD-10-CM | POA: Diagnosis not present

## 2022-09-17 DIAGNOSIS — I4891 Unspecified atrial fibrillation: Secondary | ICD-10-CM | POA: Diagnosis not present

## 2022-09-21 DIAGNOSIS — I4891 Unspecified atrial fibrillation: Secondary | ICD-10-CM | POA: Diagnosis not present

## 2022-09-21 DIAGNOSIS — J189 Pneumonia, unspecified organism: Secondary | ICD-10-CM | POA: Diagnosis not present

## 2022-09-21 DIAGNOSIS — R2681 Unsteadiness on feet: Secondary | ICD-10-CM | POA: Diagnosis not present

## 2022-09-21 DIAGNOSIS — J9611 Chronic respiratory failure with hypoxia: Secondary | ICD-10-CM | POA: Diagnosis not present

## 2022-09-21 DIAGNOSIS — N183 Chronic kidney disease, stage 3 unspecified: Secondary | ICD-10-CM | POA: Diagnosis not present

## 2022-09-21 DIAGNOSIS — F32A Depression, unspecified: Secondary | ICD-10-CM | POA: Diagnosis not present

## 2022-09-21 DIAGNOSIS — M6281 Muscle weakness (generalized): Secondary | ICD-10-CM | POA: Diagnosis not present

## 2022-09-21 DIAGNOSIS — K2100 Gastro-esophageal reflux disease with esophagitis, without bleeding: Secondary | ICD-10-CM | POA: Diagnosis not present

## 2022-09-21 DIAGNOSIS — J8410 Pulmonary fibrosis, unspecified: Secondary | ICD-10-CM | POA: Diagnosis not present

## 2022-09-21 DIAGNOSIS — I480 Paroxysmal atrial fibrillation: Secondary | ICD-10-CM | POA: Diagnosis not present

## 2022-09-24 DIAGNOSIS — R635 Abnormal weight gain: Secondary | ICD-10-CM | POA: Diagnosis not present

## 2022-09-24 DIAGNOSIS — K2100 Gastro-esophageal reflux disease with esophagitis, without bleeding: Secondary | ICD-10-CM | POA: Diagnosis not present

## 2022-09-24 DIAGNOSIS — J8410 Pulmonary fibrosis, unspecified: Secondary | ICD-10-CM | POA: Diagnosis not present

## 2022-09-24 DIAGNOSIS — M6281 Muscle weakness (generalized): Secondary | ICD-10-CM | POA: Diagnosis not present

## 2022-09-24 DIAGNOSIS — J9611 Chronic respiratory failure with hypoxia: Secondary | ICD-10-CM | POA: Diagnosis not present

## 2022-09-24 DIAGNOSIS — R2681 Unsteadiness on feet: Secondary | ICD-10-CM | POA: Diagnosis not present

## 2022-09-24 DIAGNOSIS — I4891 Unspecified atrial fibrillation: Secondary | ICD-10-CM | POA: Diagnosis not present

## 2022-09-24 DIAGNOSIS — F32A Depression, unspecified: Secondary | ICD-10-CM | POA: Diagnosis not present

## 2022-09-24 DIAGNOSIS — N390 Urinary tract infection, site not specified: Secondary | ICD-10-CM | POA: Diagnosis not present

## 2022-09-24 DIAGNOSIS — N183 Chronic kidney disease, stage 3 unspecified: Secondary | ICD-10-CM | POA: Diagnosis not present

## 2022-09-24 DIAGNOSIS — I480 Paroxysmal atrial fibrillation: Secondary | ICD-10-CM | POA: Diagnosis not present

## 2022-09-24 DIAGNOSIS — J188 Other pneumonia, unspecified organism: Secondary | ICD-10-CM | POA: Diagnosis not present

## 2022-09-24 DIAGNOSIS — J189 Pneumonia, unspecified organism: Secondary | ICD-10-CM | POA: Diagnosis not present

## 2022-09-28 DIAGNOSIS — R2681 Unsteadiness on feet: Secondary | ICD-10-CM | POA: Diagnosis not present

## 2022-09-28 DIAGNOSIS — M6281 Muscle weakness (generalized): Secondary | ICD-10-CM | POA: Diagnosis not present

## 2022-09-28 DIAGNOSIS — F32A Depression, unspecified: Secondary | ICD-10-CM | POA: Diagnosis not present

## 2022-09-28 DIAGNOSIS — J9611 Chronic respiratory failure with hypoxia: Secondary | ICD-10-CM | POA: Diagnosis not present

## 2022-09-28 DIAGNOSIS — I4891 Unspecified atrial fibrillation: Secondary | ICD-10-CM | POA: Diagnosis not present

## 2022-09-28 DIAGNOSIS — J189 Pneumonia, unspecified organism: Secondary | ICD-10-CM | POA: Diagnosis not present

## 2022-09-30 ENCOUNTER — Ambulatory Visit: Payer: Medicare Other | Admitting: Neurology

## 2022-09-30 DIAGNOSIS — R635 Abnormal weight gain: Secondary | ICD-10-CM | POA: Diagnosis not present

## 2022-09-30 DIAGNOSIS — K2100 Gastro-esophageal reflux disease with esophagitis, without bleeding: Secondary | ICD-10-CM | POA: Diagnosis not present

## 2022-09-30 DIAGNOSIS — J8410 Pulmonary fibrosis, unspecified: Secondary | ICD-10-CM | POA: Diagnosis not present

## 2022-09-30 DIAGNOSIS — J188 Other pneumonia, unspecified organism: Secondary | ICD-10-CM | POA: Diagnosis not present

## 2022-09-30 DIAGNOSIS — M6281 Muscle weakness (generalized): Secondary | ICD-10-CM | POA: Diagnosis not present

## 2022-09-30 DIAGNOSIS — N390 Urinary tract infection, site not specified: Secondary | ICD-10-CM | POA: Diagnosis not present

## 2022-09-30 DIAGNOSIS — I480 Paroxysmal atrial fibrillation: Secondary | ICD-10-CM | POA: Diagnosis not present

## 2022-09-30 DIAGNOSIS — N183 Chronic kidney disease, stage 3 unspecified: Secondary | ICD-10-CM | POA: Diagnosis not present

## 2022-10-04 ENCOUNTER — Ambulatory Visit (INDEPENDENT_AMBULATORY_CARE_PROVIDER_SITE_OTHER): Payer: Medicare Other | Admitting: Internal Medicine

## 2022-10-04 ENCOUNTER — Other Ambulatory Visit: Payer: Self-pay | Admitting: *Deleted

## 2022-10-04 ENCOUNTER — Encounter: Payer: Self-pay | Admitting: Internal Medicine

## 2022-10-04 VITALS — BP 128/80 | HR 101 | Temp 97.7°F | Ht 60.0 in | Wt 217.0 lb

## 2022-10-04 DIAGNOSIS — I10 Essential (primary) hypertension: Secondary | ICD-10-CM | POA: Diagnosis not present

## 2022-10-04 DIAGNOSIS — N1831 Chronic kidney disease, stage 3a: Secondary | ICD-10-CM

## 2022-10-04 DIAGNOSIS — J849 Interstitial pulmonary disease, unspecified: Secondary | ICD-10-CM

## 2022-10-04 DIAGNOSIS — R413 Other amnesia: Secondary | ICD-10-CM

## 2022-10-04 DIAGNOSIS — J9611 Chronic respiratory failure with hypoxia: Secondary | ICD-10-CM

## 2022-10-04 LAB — BASIC METABOLIC PANEL WITH GFR
BUN: 14 mg/dL (ref 6–23)
CO2: 35 meq/L — ABNORMAL HIGH (ref 19–32)
Calcium: 9.4 mg/dL (ref 8.4–10.5)
Chloride: 95 meq/L — ABNORMAL LOW (ref 96–112)
Creatinine, Ser: 1.01 mg/dL (ref 0.40–1.20)
GFR: 52.1 mL/min — ABNORMAL LOW
Glucose, Bld: 95 mg/dL (ref 70–99)
Potassium: 4 meq/L (ref 3.5–5.1)
Sodium: 138 meq/L (ref 135–145)

## 2022-10-04 LAB — CBC WITH DIFFERENTIAL/PLATELET
Basophils Absolute: 0.1 10*3/uL (ref 0.0–0.1)
Basophils Relative: 0.9 % (ref 0.0–3.0)
Eosinophils Absolute: 0.2 10*3/uL (ref 0.0–0.7)
Eosinophils Relative: 2.5 % (ref 0.0–5.0)
HCT: 36.2 % (ref 36.0–46.0)
Hemoglobin: 12.2 g/dL (ref 12.0–15.0)
Lymphocytes Relative: 20.6 % (ref 12.0–46.0)
Lymphs Abs: 1.7 10*3/uL (ref 0.7–4.0)
MCHC: 33.8 g/dL (ref 30.0–36.0)
MCV: 98.7 fl (ref 78.0–100.0)
Monocytes Absolute: 0.6 10*3/uL (ref 0.1–1.0)
Monocytes Relative: 6.9 % (ref 3.0–12.0)
Neutro Abs: 5.8 10*3/uL (ref 1.4–7.7)
Neutrophils Relative %: 69.1 % (ref 43.0–77.0)
Platelets: 196 10*3/uL (ref 150.0–400.0)
RBC: 3.67 Mil/uL — ABNORMAL LOW (ref 3.87–5.11)
RDW: 13.7 % (ref 11.5–15.5)
WBC: 8.4 10*3/uL (ref 4.0–10.5)

## 2022-10-04 LAB — HEPATIC FUNCTION PANEL
ALT: 9 U/L (ref 0–35)
AST: 21 U/L (ref 0–37)
Albumin: 3.5 g/dL (ref 3.5–5.2)
Alkaline Phosphatase: 59 U/L (ref 39–117)
Bilirubin, Direct: 0.1 mg/dL (ref 0.0–0.3)
Total Bilirubin: 0.6 mg/dL (ref 0.2–1.2)
Total Protein: 7 g/dL (ref 6.0–8.3)

## 2022-10-04 NOTE — Progress Notes (Signed)
Patient ID: Kathy [name], female   DOB: [birth year], 82 y.o.   MRN: [id number]        Chief Complaint: follow up recent hospn CAP, UTI sepsis, CKD 3a and now recent worsening memory loss       HPI:  Kathy [name] is a 82 y.o. female here with hx of ILD and chronic hypoxia without pulm f/u Dr Isaiah Serge since oct 2021; here with son with recent hospn feb 16 - feb 24 with CAP, UTI, sepsis with some perisstent scant prod cough but Pt denies chest pain, increased sob or doe, wheezing, orthopnea, PND, increased LE swelling, palpitations, dizziness or syncope.   Pt denies polydipsia, polyuria, or new focal neuro s/s.    Pt denies fever, wt loss, night sweats, loss of appetite, or other constitutional symptoms  Has had worsening memory changes in past 3-6 mo, did not follow through with MRI as ordered feb 2024 but son and family much more involved now, asks for MRI brain and refer neurology.  Denies urinary symptoms such as dysuria, frequency, urgency, flank pain, hematuria or n/v, fever, chills.         Wt Readings from Last 3 Encounters:  10/04/22 217 lb (98.4 kg)  09/08/22 216 lb (98 kg)  08/13/22 225 lb (102.1 kg)   BP Readings from Last 3 Encounters:  10/04/22 128/80  09/08/22 (!) 118/52  08/21/22 (!) 125/54         Past Medical History:  Diagnosis Date   Arthritis    fingers   CKD (chronic kidney disease) stage 3, GFR 30-59 ml/min 11/30/2017   Depression    Dizziness    in AM, getting out of bed   Dysrhythmia    A fib   Endometrial ca 11/30/2017   S/p surgury 1990's   GERD (gastroesophageal reflux disease) 11/30/2017   HLD (hyperlipidemia) 11/30/2017   Hypercholesteremia    Hypertension    Interstitial lung disease    Neuropathy    bilateral feet   Pneumonia 12/14/2021   Shortness of breath dyspnea    Sleep apnea    has CPAP, doesn't use   Umbilical hernia    Past Surgical History:  Procedure Laterality Date   ABDOMINAL HYSTERECTOMY     BROW LIFT Bilateral 07/15/2015   Procedure:  BLEPHAROPLASTY;  Surgeon: Imagene Riches, MD;  Location: [facility];  Service: Ophthalmology;  Laterality: Bilateral;   BUBBLE STUDY  08/18/2022   Procedure: BUBBLE STUDY;  Surgeon: Tessa Lerner, DO;  Location: [facility];  Service: Cardiovascular;;   CARDIOVERSION N/A 08/18/2022   Procedure: CARDIOVERSION;  Surgeon: Tessa Lerner, DO;  Location: [facility];  Service: Cardiovascular;  Laterality: N/A;   CATARACT EXTRACTION W/PHACO Right 12/31/2021   Procedure: CATARACT EXTRACTION PHACO AND INTRAOCULAR LENS PLACEMENT (IOC) RIGHT;  Surgeon: Estanislado Pandy, MD;  Location: [facility];  Service: Ophthalmology;  Laterality: Right;  17.11 1:46.1   CHOLECYSTECTOMY     HAMMER TOE SURGERY     HERNIA REPAIR     KNEE ARTHROSCOPY Bilateral    PTOSIS REPAIR Bilateral 07/15/2015   Procedure: PTOSIS REPAIR;  Surgeon: Imagene Riches, MD;  Location: [facility];  Service: Ophthalmology;  Laterality: Bilateral;  CPAP   TEE WITHOUT CARDIOVERSION N/A 08/18/2022   Procedure: TRANSESOPHAGEAL ECHOCARDIOGRAM (TEE);  Surgeon: Tessa Lerner, DO;  Location: [facility];  Service: Cardiovascular;  Laterality: N/A;   TONSILLECTOMY      reports that she quit smoking about 36 years ago. Her smoking use included  cigarettes. She has a 10.00 pack-year smoking history. She has never used smokeless tobacco. She reports that she does not drink alcohol and does not use drugs. family history includes Congestive Heart Failure in her mother; Diabetes in her son; Parkinson's disease in her father; Stroke in her father. Allergies  Allergen Reactions   Lipitor [Atorvastatin] Other (See Comments)    Memory issues   Requip [Ropinirole Hcl] Other (See Comments)    Pt reports feeling generally unwell on this medication   Current Outpatient Medications on File Prior to Visit  Medication Sig Dispense Refill   LASIX 20 MG tablet      albuterol (VENTOLIN HFA) 108 (90 Base) MCG/ACT inhaler Inhale 1-2 puffs into  the lungs every 6 (six) hours as needed for wheezing or shortness of breath. 1 each 11   amitriptyline (ELAVIL) 50 MG tablet Take 1 tablet (50 mg total) by mouth at bedtime as needed for sleep. And neuropathy     apixaban (ELIQUIS) 5 MG TABS tablet Take 1 tablet (5 mg total) by mouth 2 (two) times daily. 180 tablet 3   Cholecalciferol (VITAMIN D-3) 25 MCG (1000 UT) CAPS Take 1 capsule by mouth daily.     cyanocobalamin (VITAMIN B12) 1000 MCG/ML injection INJECT 1 ML INTO MUSCLE EVERY 30 DAYS 3 mL 1   diltiazem (CARDIZEM SR) 90 MG 12 hr capsule Take 1 capsule (90 mg total) by mouth every 12 (twelve) hours. 60 capsule 0   docusate sodium (COLACE) 100 MG capsule Take 100 mg by mouth 2 (two) times daily.     pantoprazole (PROTONIX) 40 MG tablet TAKE 1 TABLET BY MOUTH EVERY DAY 90 tablet 3   Respiratory Therapy Supplies (FLUTTER) DEVI Use as directed 1 each 0   sertraline (ZOLOFT) 25 MG tablet Take 1 tablet (25 mg total) by mouth daily. 30 tablet 0   sotalol (BETAPACE) 80 MG tablet Take 0.5 tablets (40 mg total) by mouth every 12 (twelve) hours. 30 tablet 0   triamcinolone cream (KENALOG) 0.5 % Apply 1 Application topically 2 (two) times daily as needed. (Patient taking differently: Apply 1 Application topically 2 (two) times daily as needed (for itching).) 454 g 1   No current facility-administered medications on file prior to visit.        ROS:  All others reviewed and negative.  Objective        PE:  BP 128/80   Pulse (!) 101   Temp 97.7 F (36.5 C) (Oral)   Ht 5' (1.524 m)   Wt 217 lb (98.4 kg)   SpO2 96%   BMI 42.38 kg/m                 Constitutional: Pt appears in NAD, non toxic, using walker on home o2               HENT: Head: NCAT.                Right Ear: External ear normal.                 Left Ear: External ear normal.                Eyes: . Pupils are equal, round, and reactive to light. Conjunctivae and EOM are normal               Nose: without d/c or deformity                Neck: Neck  supple. Gross normal ROM               Cardiovascular: Normal rate and regular rhythm.                 Pulmonary/Chest: Effort normal and breath sounds without rales or wheezing.                Abd:  Soft, NT, ND, + BS, no organomegaly               Neurological: Pt is alert. At baseline orientation, motor grossly intact               Skin: Skin is warm. No rashes, no other new lesions, LE edema - none               Psychiatric: Pt behavior is normal without agitation   Micro: none  Cardiac tracings I have personally interpreted today:  none  Pertinent Radiological findings (summarize): none   Lab Results  Component Value Date   WBC 8.0 08/21/2022   HGB 10.7 (L) 08/21/2022   HCT 34.3 (L) 08/21/2022   PLT 190 08/21/2022   GLUCOSE 112 (H) 08/21/2022   CHOL 149 07/05/2022   TRIG 77.0 07/05/2022   HDL 55.90 07/05/2022   LDLCALC 78 07/05/2022   ALT 20 08/20/2022   AST 23 08/20/2022   NA 139 08/21/2022   K 4.5 08/21/2022   CL 98 08/21/2022   CREATININE 1.10 (H) 08/21/2022   BUN 17 08/21/2022   CO2 34 (H) 08/21/2022   TSH 2.17 07/05/2022   HGBA1C 5.8 07/05/2022   MICROALBUR 1.7 07/05/2022   Assessment/Plan:  Arayah Nickolas is a 82 y.o. White or Caucasian [1] female with  has a past medical history of Arthritis, CKD (chronic kidney disease) stage 3, GFR 30-59 ml/min (11/30/2017), Depression, Dizziness, Dysrhythmia, Endometrial ca (11/30/2017), GERD (gastroesophageal reflux disease) (11/30/2017), HLD (hyperlipidemia) (11/30/2017), Hypercholesteremia, Hypertension, Interstitial lung disease, Neuropathy, Pneumonia (12/14/2021), Shortness of breath dyspnea, Sleep apnea, and Umbilical hernia.  Chronic respiratory failure with hypoxia (HCC) Stable, refer pulmonary  ILD (interstitial lung disease) (HCC) Chronic, for pulm referral  CKD (chronic kidney disease) stage 3, GFR 30-59 ml/min (HCC) Lab Results  Component Value Date   CREATININE 1.10 (H) 08/21/2022   Stable  overall, cont to avoid nephrotoxins   Essential hypertension BP Readings from Last 3 Encounters:  10/04/22 128/80  09/08/22 (!) 118/52  08/21/22 (!) 125/54   Stable, pt to continue medical treatment cardizem sr 90 mg   Memory loss Recent worsening, for MRI brain, and refer neurology Followup: Return in about 6 months (around 04/05/2023).  Oliver Barre, MD 10/04/2022 4:56 PM [facility]

## 2022-10-04 NOTE — Assessment & Plan Note (Signed)
Recent worsening, for MRI brain, and refer neurology

## 2022-10-04 NOTE — Assessment & Plan Note (Signed)
Lab Results  Component Value Date   CREATININE 1.10 (H) 08/21/2022   Stable overall, cont to avoid nephrotoxins

## 2022-10-04 NOTE — Assessment & Plan Note (Signed)
Chronic, for pulm referral

## 2022-10-04 NOTE — Patient Outreach (Signed)
[facility] Post-Acute Care Coordinator follow up. Verified in [facility] Mrs. Gladman discharged from [facility] 10/01/22. Screening for [facility] care coordination services as benefit of health plan and PCP.   Telephone call made to Mr. Anis (spouse/DPR) [phone]. Patient identifiers confirmed. Mr. Antezana confirms Mrs. Bebb came home on Friday. Not sure what home health agency was arranged. States Mrs. Willig is doing pretty good. States PCP appointment scheduled for today. Mr. Yanos is agreeable for [facility] care coordination follow up. Mr. Cornelsen is primary contact at [phone].   Confirmed with [place]. [facility] social worker, [facility] home health agency accepted home health referral.   Will refer for [facility] RN for care coordination.  Mrs Vanliere has medical history of  HTN, CKD stage 3, afib, ILD wears home 02, chronic hypoxic respiratory failure, cognitive deficit.   Raiford Noble, MSN, RN,BSN [facility] Post Acute Care Coordinator [phone] (Direct dial)

## 2022-10-04 NOTE — Assessment & Plan Note (Signed)
BP Readings from Last 3 Encounters:  10/04/22 128/80  09/08/22 (!) 118/52  08/21/22 (!) 125/54   Stable, pt to continue medical treatment cardizem sr 90 mg

## 2022-10-04 NOTE — Patient Instructions (Addendum)
Please continue all other medications as before, and refills have been done if requested.  Please have the pharmacy call with any other refills you may need.  Please remember you should not Drive until further notice  Please keep your appointments with your specialists as you may have planned  You will be contacted regarding the referral for: MRI brain, and Dr Isaiah Serge for lung doctor, and Neurology  Please go to the LAB at the blood drawing area for the tests to be done  You will be contacted by phone if any changes need to be made immediately.  Otherwise, you will receive a letter about your results with an explanation, but please check with MyChart first.  Please remember to sign up for MyChart if you have not done so, as this will be important to you in the future with finding out test results, communicating by private email, and scheduling acute appointments online when needed.  Please make an Appointment to return in 6 months, or sooner if needed

## 2022-10-04 NOTE — Assessment & Plan Note (Signed)
Stable, refer pulmonary

## 2022-10-05 ENCOUNTER — Telehealth: Payer: Self-pay | Admitting: *Deleted

## 2022-10-05 ENCOUNTER — Telehealth: Payer: Self-pay | Admitting: Internal Medicine

## 2022-10-05 DIAGNOSIS — Z7901 Long term (current) use of anticoagulants: Secondary | ICD-10-CM | POA: Diagnosis not present

## 2022-10-05 DIAGNOSIS — J189 Pneumonia, unspecified organism: Secondary | ICD-10-CM | POA: Diagnosis not present

## 2022-10-05 DIAGNOSIS — G9341 Metabolic encephalopathy: Secondary | ICD-10-CM | POA: Diagnosis not present

## 2022-10-05 DIAGNOSIS — N390 Urinary tract infection, site not specified: Secondary | ICD-10-CM | POA: Diagnosis not present

## 2022-10-05 DIAGNOSIS — K5900 Constipation, unspecified: Secondary | ICD-10-CM | POA: Diagnosis not present

## 2022-10-05 DIAGNOSIS — R32 Unspecified urinary incontinence: Secondary | ICD-10-CM | POA: Diagnosis not present

## 2022-10-05 DIAGNOSIS — I129 Hypertensive chronic kidney disease with stage 1 through stage 4 chronic kidney disease, or unspecified chronic kidney disease: Secondary | ICD-10-CM | POA: Diagnosis not present

## 2022-10-05 DIAGNOSIS — M1990 Unspecified osteoarthritis, unspecified site: Secondary | ICD-10-CM | POA: Diagnosis not present

## 2022-10-05 DIAGNOSIS — G4730 Sleep apnea, unspecified: Secondary | ICD-10-CM | POA: Diagnosis not present

## 2022-10-05 DIAGNOSIS — F32A Depression, unspecified: Secondary | ICD-10-CM | POA: Diagnosis not present

## 2022-10-05 DIAGNOSIS — J8410 Pulmonary fibrosis, unspecified: Secondary | ICD-10-CM | POA: Diagnosis not present

## 2022-10-05 DIAGNOSIS — J849 Interstitial pulmonary disease, unspecified: Secondary | ICD-10-CM | POA: Diagnosis not present

## 2022-10-05 DIAGNOSIS — E7800 Pure hypercholesterolemia, unspecified: Secondary | ICD-10-CM | POA: Diagnosis not present

## 2022-10-05 DIAGNOSIS — E785 Hyperlipidemia, unspecified: Secondary | ICD-10-CM | POA: Diagnosis not present

## 2022-10-05 DIAGNOSIS — Z9981 Dependence on supplemental oxygen: Secondary | ICD-10-CM | POA: Diagnosis not present

## 2022-10-05 DIAGNOSIS — K2100 Gastro-esophageal reflux disease with esophagitis, without bleeding: Secondary | ICD-10-CM | POA: Diagnosis not present

## 2022-10-05 DIAGNOSIS — N1831 Chronic kidney disease, stage 3a: Secondary | ICD-10-CM | POA: Diagnosis not present

## 2022-10-05 DIAGNOSIS — Z87891 Personal history of nicotine dependence: Secondary | ICD-10-CM | POA: Diagnosis not present

## 2022-10-05 DIAGNOSIS — G629 Polyneuropathy, unspecified: Secondary | ICD-10-CM | POA: Diagnosis not present

## 2022-10-05 DIAGNOSIS — J9611 Chronic respiratory failure with hypoxia: Secondary | ICD-10-CM

## 2022-10-05 NOTE — Progress Notes (Signed)
  Care Coordination   Note   10/05/2022 Name: Territa Mori MRN: [id number] DOB: 1940/07/18  Oceania Shenberger is a 82 y.o. year old female who sees Corwin Levins, MD for primary care. I reached out to [name] by phone today to offer care coordination services.  Ms. Alessandra was given information about Care Coordination services today including:   The Care Coordination services include support from the care team which includes your Nurse Coordinator, Clinical Social Worker, or Pharmacist.  The Care Coordination team is here to help remove barriers to the health concerns and goals most important to you. Care Coordination services are voluntary, and the patient may decline or stop services at any time by request to their care team member.   Care Coordination Consent Status: Patient agreed to services and verbal consent obtained.   Follow up plan:  Telephone appointment with care coordination team member scheduled for:  10/08/2022  Encounter Outcome:  Pt. Scheduled from referral   Burman Nieves, [facility] Care Coordination Care Guide Direct Dial: [phone]

## 2022-10-05 NOTE — Telephone Encounter (Signed)
Alan Mulder gave MD response on verbal. She states the pt cough is terrible. She is spitting up grayish/green mucus. She is wanting to know can MD order a cxr since pt was diagnose w/ pneumonia.Marland KitchenRaechel Chute

## 2022-10-05 NOTE — Telephone Encounter (Signed)
Stephanie from [facility] called for continuation of HH orders with a frequency of:    1X for 5 weeks & every other week for 4 weeks  Pt's heart rate was in the 50's and the pt is coughing up grey mucus and sputum   Judeth Cornfield suggested we order x-rays since the pt was just in the hospital with pneumonia  Please call Judeth Cornfield to confirm:  Judeth Cornfield [phone]

## 2022-10-05 NOTE — Progress Notes (Signed)
The test results show that your current treatment is OK, as the tests are stable.  Please continue the same plan.  There is no other need for change of treatment or further evaluation based on these results, at this time.  thanks 

## 2022-10-05 NOTE — Telephone Encounter (Signed)
Ok for verbals 

## 2022-10-05 NOTE — Telephone Encounter (Signed)
Alan Mulder gave MD response. She states she will notify the daughter in law to bring to have done//lmb

## 2022-10-05 NOTE — Telephone Encounter (Signed)
Ok cxr is ordered for [employer]

## 2022-10-06 ENCOUNTER — Ambulatory Visit (INDEPENDENT_AMBULATORY_CARE_PROVIDER_SITE_OTHER): Payer: Medicare Other

## 2022-10-06 DIAGNOSIS — J849 Interstitial pulmonary disease, unspecified: Secondary | ICD-10-CM

## 2022-10-06 DIAGNOSIS — R059 Cough, unspecified: Secondary | ICD-10-CM | POA: Diagnosis not present

## 2022-10-06 DIAGNOSIS — J9611 Chronic respiratory failure with hypoxia: Secondary | ICD-10-CM

## 2022-10-06 DIAGNOSIS — J189 Pneumonia, unspecified organism: Secondary | ICD-10-CM

## 2022-10-06 DIAGNOSIS — R0902 Hypoxemia: Secondary | ICD-10-CM | POA: Diagnosis not present

## 2022-10-06 DIAGNOSIS — J9690 Respiratory failure, unspecified, unspecified whether with hypoxia or hypercapnia: Secondary | ICD-10-CM | POA: Diagnosis not present

## 2022-10-07 DIAGNOSIS — J189 Pneumonia, unspecified organism: Secondary | ICD-10-CM | POA: Diagnosis not present

## 2022-10-07 DIAGNOSIS — G629 Polyneuropathy, unspecified: Secondary | ICD-10-CM | POA: Diagnosis not present

## 2022-10-07 DIAGNOSIS — N1831 Chronic kidney disease, stage 3a: Secondary | ICD-10-CM | POA: Diagnosis not present

## 2022-10-07 DIAGNOSIS — N390 Urinary tract infection, site not specified: Secondary | ICD-10-CM | POA: Diagnosis not present

## 2022-10-07 DIAGNOSIS — I129 Hypertensive chronic kidney disease with stage 1 through stage 4 chronic kidney disease, or unspecified chronic kidney disease: Secondary | ICD-10-CM | POA: Diagnosis not present

## 2022-10-07 DIAGNOSIS — M1990 Unspecified osteoarthritis, unspecified site: Secondary | ICD-10-CM | POA: Diagnosis not present

## 2022-10-08 ENCOUNTER — Ambulatory Visit: Payer: Self-pay

## 2022-10-08 ENCOUNTER — Telehealth: Payer: Self-pay | Admitting: Internal Medicine

## 2022-10-08 MED ORDER — LEVOFLOXACIN 500 MG PO TABS: 500.0000 mg | ORAL_TABLET | Freq: Every day | ORAL | 0 refills | Status: AC

## 2022-10-08 MED ORDER — HYDROCODONE BIT-HOMATROP MBR 5-1.5 MG/5ML PO SOLN: 5.0000 mL | Freq: Four times a day (QID) | ORAL | 0 refills | Status: DC | PRN

## 2022-10-08 NOTE — Telephone Encounter (Signed)
Spoke separately to son and daughter by phone  Cxr cw possible worsening right pneeumonia, suspect bacterial  Ok for levaquin 500 qd x 10 day, wife of son states she will drive to [place] and pick up in the AM at [facility]

## 2022-10-08 NOTE — Patient Outreach (Signed)
  Care Coordination   Initial Visit Note   10/08/2022 Name: Kathy [name] MRN: [id number] DOB: [birth year]  Kathy [name] is a 82 y.o. year old female who sees Corwin Levins, MD for primary care. I spoke with  Kathy [name]. Husband Kathy [name] and daughter in law Kathy [name] by phone today.  What matters to the patients health and wellness today?  Patient with h/o memory issues. However she was able to state DOB and address with time allowed for processing. Per patient, she is doing well. She acknowledges home health has been out to see her and that she is wearing her oxygen. Kathy [name] states both Mr. and Mrs Dacy have memory issues and family assist them with managing their health. Kathy [name] reports patient fell on Wednesday while trying to get out of the car, stumbled and fell on concrete and hit back of her head. Kathy [name] reports there was a small knot that is gone down and states it is now a red spot. Kathy [name] reports, patient refused to go to the ED at that time. Kathy [name] expressed patient has had some confusion, but states patient also had confusion prior to going to rehab. Kathy [name] also they are awaiting an MRI for patient discussed at last office visit 10/04/22. I encouraged Kathy [name] to call to notify PCP as patient may need to be seen. Patient is on Eliquis. Kathy [name] states she will call to notify PCP of patient fall.  Goals Addressed             This Visit's Progress    Care Coordination activities-transition post acute rehab       Interventions Today    Flowsheet Row Most Recent Value  Chronic Disease   Chronic disease during today's visit Other  [discharge from rehab 10/01/22]  General Interventions   General Interventions Discussed/Reviewed General Interventions Discussed, Doctor Visits  Doctor Visits Discussed/Reviewed Doctor Visits Discussed, PCP  [upcoming scheduled visits reviewed.]  PCP/Specialist Visits Compliance with follow-up visit  [facility] sent in basket message to update PCP regarding patient fall.]   Education Interventions   Education Provided Provided Education  Provided Verbal Education On Other  [discussed with daughter in law, the importance of notifying PCP of fall and seeking medical attention in the event of fall]  Pharmacy Interventions   Pharmacy Dicussed/Reviewed Pharmacy Topics Discussed  Safety Interventions   Safety Discussed/Reviewed Fall Risk, Safety Discussed  [saftey prevention strategies. confirmed home health PT/OT and RN active with patient.]            SDOH assessments and interventions completed:  Yes  SDOH Interventions Today    Flowsheet Row Most Recent Value  SDOH Interventions   Food Insecurity Interventions Intervention Not Indicated  Transportation Interventions Intervention Not Indicated     Care Coordination Interventions:  Yes, provided   Follow up plan: Follow up call scheduled for 10/15/22    Encounter Outcome:  Pt. Visit Completed   Kathyrn Sheriff, RN, MSN, BSN, [facility] Care Coordinator [phone]

## 2022-10-08 NOTE — Patient Instructions (Addendum)
Visit Information  Thank you for taking time to visit with me today. Please don't hesitate to contact me if I can be of assistance to you.   Following are the goals we discussed today:   Goals Addressed             This Visit's Progress    Care Coordination activities-transition post acute rehab       Interventions Today    Flowsheet Row Most Recent Value  Chronic Disease   Chronic disease during today's visit Other  [discharge from rehab 10/01/22]  General Interventions   General Interventions Discussed/Reviewed General Interventions Discussed, Doctor Visits  Doctor Visits Discussed/Reviewed Doctor Visits Discussed, PCP  [upcoming scheduled visits reviewed.]  PCP/Specialist Visits Compliance with follow-up visit  [facility] sent in basket message to update PCP regarding patient fall.]  Education Interventions   Education Provided Provided Education  Provided Verbal Education On Other  [discussed with daughter in law, the importance of notifying PCP of fall and seeking medical attention in the event of fall]  Pharmacy Interventions   Pharmacy Dicussed/Reviewed Pharmacy Topics Discussed  Safety Interventions   Safety Discussed/Reviewed Fall Risk, Safety Discussed  [saftey prevention strategies. confirmed home health PT/OT and RN active with patient.]            Our next appointment is by telephone on 10/15/22 at 9:00 am  Please call the care guide team at [phone] if you need to cancel or reschedule your appointment.   If you are experiencing a Mental Health or Behavioral Health Crisis or need someone to talk to, please call the Suicide and Crisis Lifeline: 28  Kathyrn Sheriff, RN, MSN, BSN, CCM [facility] Care Coordinator [phone]

## 2022-10-09 ENCOUNTER — Telehealth: Payer: Self-pay | Admitting: Family Medicine

## 2022-10-09 NOTE — Telephone Encounter (Signed)
On call message received from [employer]  Local pharmacist noted that levaquin- this was px for pneumonia will interact poorly with sotalol   Suggested change to doxycycline  Sent doxycycline 100 mg bid for 10 d    Will send to Dr Jonny Ruiz to make sure he is in agreement

## 2022-10-12 ENCOUNTER — Telehealth: Payer: Self-pay | Admitting: Internal Medicine

## 2022-10-12 ENCOUNTER — Ambulatory Visit (INDEPENDENT_AMBULATORY_CARE_PROVIDER_SITE_OTHER): Payer: Medicare Other | Admitting: Neurology

## 2022-10-12 ENCOUNTER — Encounter: Payer: Self-pay | Admitting: Neurology

## 2022-10-12 ENCOUNTER — Telehealth: Payer: Self-pay | Admitting: Neurology

## 2022-10-12 VITALS — BP 99/62 | HR 52 | Ht 60.0 in | Wt 210.0 lb

## 2022-10-12 DIAGNOSIS — J189 Pneumonia, unspecified organism: Secondary | ICD-10-CM | POA: Diagnosis not present

## 2022-10-12 DIAGNOSIS — E538 Deficiency of other specified B group vitamins: Secondary | ICD-10-CM

## 2022-10-12 DIAGNOSIS — Z8669 Personal history of other diseases of the nervous system and sense organs: Secondary | ICD-10-CM

## 2022-10-12 DIAGNOSIS — Z9289 Personal history of other medical treatment: Secondary | ICD-10-CM

## 2022-10-12 DIAGNOSIS — R413 Other amnesia: Secondary | ICD-10-CM | POA: Diagnosis not present

## 2022-10-12 DIAGNOSIS — G629 Polyneuropathy, unspecified: Secondary | ICD-10-CM | POA: Diagnosis not present

## 2022-10-12 DIAGNOSIS — Z9189 Other specified personal risk factors, not elsewhere classified: Secondary | ICD-10-CM | POA: Diagnosis not present

## 2022-10-12 DIAGNOSIS — I6381 Other cerebral infarction due to occlusion or stenosis of small artery: Secondary | ICD-10-CM | POA: Diagnosis not present

## 2022-10-12 DIAGNOSIS — M1990 Unspecified osteoarthritis, unspecified site: Secondary | ICD-10-CM | POA: Diagnosis not present

## 2022-10-12 DIAGNOSIS — N1831 Chronic kidney disease, stage 3a: Secondary | ICD-10-CM | POA: Diagnosis not present

## 2022-10-12 DIAGNOSIS — I129 Hypertensive chronic kidney disease with stage 1 through stage 4 chronic kidney disease, or unspecified chronic kidney disease: Secondary | ICD-10-CM | POA: Diagnosis not present

## 2022-10-12 DIAGNOSIS — N390 Urinary tract infection, site not specified: Secondary | ICD-10-CM | POA: Diagnosis not present

## 2022-10-12 DIAGNOSIS — Z9181 History of falling: Secondary | ICD-10-CM

## 2022-10-12 NOTE — Progress Notes (Signed)
Subjective:    Patient ID: Kathy [name] is a 82 y.o. female.  HPI    Huston Foley, MD, PhD [facility] [address] [address] [place], [place] [address]  Dear Dr. Jonny Ruiz,  I saw your patient, Kathy [name], upon your kind request in my neurologic clinic today for initial consultation of her memory loss.  The patient is accompanied by her daughter today as you know, Kathy [name] is a 82 year old female with an underlying complex medical history of chronic kidney disease, arthritis, endometrial cancer, hypertension, hyperlipidemia, reflux disease, interstitial lung disease, chronic respiratory failure, neuropathy, sleep apnea (not currently on PAP therapy), arthritis, recent hospitalization for pneumonia, UTI and sepsis, atrial fibrillation, status post cardioversion in February 2024, and severe obesity with a BMI of over 40, who reports forgetfulness for the past several months.  She had a fall in November 2023.  She had a severe UTI and sepsis and was hospitalized in [place] as she was visiting her daughter.  Her daughter lives in [place].  She has 2 sons in [place], locally, daughter in law is also involved in her care.  She fell last week but did not seek medical attention, she fell on her left side and bruised her left shoulder area but also hit her head on the back.  She did not lose consciousness and denies any severe headache at this time but did not seek medical attention either.  She reports that she could not tolerate her CPAP, sleep testing was over 10 years ago and she no longer has her CPAP machine but she has been on 24-7 oxygen for the past 3 years.   She does not hydrate very well, she estimates that she drinks about 2 bottles of water per day.  She likes to drink soda, up to 3 bottles per day but has not had any in the past couple of months since she has been sick.  She is finishing antibiotics for pneumonia.  She uses a walker at home.  She is  currently in a wheelchair.  She lives with her husband.  She is a non-smoker and does not drink alcohol.    She typically gets B12 injections but has not had any in the past 2 months per daughter.  I reviewed your office note from 10/04/2022.  She had a recent brain MRI without contrast when she was hospitalized in February, on 08/14/2022, indication of altered mental status and cough.  I reviewed the results: Impression: No acute intracranial process.  No evidence of acute or subacute infarct.  In the narrative of the report she was noted to have dilated perivascular spaces in the bilateral basal ganglia.  Scattered remote lacunar infarcts in the bilateral lentiform nuclei.  Scattered T2 hyperintense signal in the perivascular white matter and pons, likely sequelae of moderate chronic small vessel ischemic disease.  I personally reviewed the images through the PACS system.  Generalized atrophy was noted.  She has upcoming appointments with cardiology and pulmonology.  She is currently on Eliquis.  She takes amitriptyline at bedtime.  TSH in January 2024 was in the normal range, A1c was 5.8, vitamin D 41.5.  Lipid panel showed benign findings with total cholesterol of 149, HDL 55.9, LDL 78.  Her vitamin B12 was borderline low at 212.  Her Past Medical History Is Significant For: Past Medical History:  Diagnosis Date   Arthritis    fingers   CKD (chronic kidney disease) stage 3, GFR 30-59 ml/min  11/30/2017   Depression    Dizziness    in AM, getting out of bed   Dysrhythmia    A fib   Endometrial ca 11/30/2017   S/p surgury 1990's   Fall    GERD (gastroesophageal reflux disease) 11/30/2017   HLD (hyperlipidemia) 11/30/2017   Hypercholesteremia    Hypertension    Interstitial lung disease    Neuropathy    bilateral feet   Pneumonia 12/14/2021   Shortness of breath dyspnea    Sleep apnea    has CPAP, doesn't use   Umbilical hernia     Her Past Surgical History Is Significant  For: Past Surgical History:  Procedure Laterality Date   ABDOMINAL HYSTERECTOMY     BROW LIFT Bilateral 07/15/2015   Procedure: BLEPHAROPLASTY;  Surgeon: Imagene Riches, MD;  Location: [facility];  Service: Ophthalmology;  Laterality: Bilateral;   BUBBLE STUDY  08/18/2022   Procedure: BUBBLE STUDY;  Surgeon: Tessa Lerner, DO;  Location: [facility];  Service: Cardiovascular;;   CARDIOVERSION N/A 08/18/2022   Procedure: CARDIOVERSION;  Surgeon: Tessa Lerner, DO;  Location: [facility];  Service: Cardiovascular;  Laterality: N/A;   CATARACT EXTRACTION W/PHACO Right 12/31/2021   Procedure: CATARACT EXTRACTION PHACO AND INTRAOCULAR LENS PLACEMENT (IOC) RIGHT;  Surgeon: Estanislado Pandy, MD;  Location: [facility];  Service: Ophthalmology;  Laterality: Right;  17.11 1:46.1   CHOLECYSTECTOMY     HAMMER TOE SURGERY     HERNIA REPAIR     KNEE ARTHROSCOPY Bilateral    PTOSIS REPAIR Bilateral 07/15/2015   Procedure: PTOSIS REPAIR;  Surgeon: Imagene Riches, MD;  Location: [facility];  Service: Ophthalmology;  Laterality: Bilateral;  CPAP   TEE WITHOUT CARDIOVERSION N/A 08/18/2022   Procedure: TRANSESOPHAGEAL ECHOCARDIOGRAM (TEE);  Surgeon: Tessa Lerner, DO;  Location: [facility];  Service: Cardiovascular;  Laterality: N/A;   TONSILLECTOMY      Her Family History Is Significant For: Family History  Problem Relation Age of Onset   Congestive Heart Failure Mother    Stroke Father    Parkinson's disease Father    Diabetes Son     Her Social History Is Significant For: Social History   Socioeconomic History   Marital status: Married    Spouse name: Not on file   Number of children: 3   Years of education: Not on file   Highest education level: Associate degree: occupational, Scientist, product/process development, or vocational program  Occupational History   Occupation: Retired  Tobacco Use   Smoking status: Former    Packs/day: 1.00    Years: 10.00    Additional pack years: 0.00     Total pack years: 10.00    Types: Cigarettes    Quit date: 06/28/1986    Years since quitting: 36.3   Smokeless tobacco: Never   Tobacco comments:    quit 40+ yrs ago, 1 PPD for a few years  Vaping Use   Vaping Use: Never used  Substance and Sexual Activity   Alcohol use: No   Drug use: No   Sexual activity: Not on file  Other Topics Concern   Not on file  Social History Narrative   Lives with her husband   Right handed   Caffeine: very minimal    Social Determinants of Health   Financial Resource Strain: Low Risk  (02/09/2022)   Overall Financial Resource Strain (CARDIA)    Difficulty of Paying Living Expenses: Not hard at all  Food Insecurity: No Food Insecurity (10/08/2022)  Hunger Vital Sign    Worried About Running Out of Food in the Last Year: Never true    Ran Out of Food in the Last Year: Never true  Transportation Needs: No Transportation Needs (10/08/2022)   PRAPARE - Administrator, Civil Service (Medical): No    Lack of Transportation (Non-Medical): No  Physical Activity: Inactive (02/09/2022)   Exercise Vital Sign    Days of Exercise per Week: 0 days    Minutes of Exercise per Session: 0 min  Stress: No Stress Concern Present (02/09/2022)   [employer] of Occupational Health - Occupational Stress Questionnaire    Feeling of Stress : Not at all  Social Connections: Moderately Isolated (02/09/2022)   Social Connection and Isolation Panel [NHANES]    Frequency of Communication with Friends and Family: More than three times a week    Frequency of Social Gatherings with Friends and Family: More than three times a week    Attends Religious Services: Never    Database administrator or Organizations: No    Attends Engineer, structural: Never    Marital Status: Married    Her Allergies Are:  Allergies  Allergen Reactions   Lipitor [Atorvastatin] Other (See Comments)    Memory issues   Requip [Ropinirole Hcl] Other (See Comments)    Pt  reports feeling generally unwell on this medication  :   Her Current Medications Are:  Outpatient Encounter Medications as of 10/12/2022  Medication Sig   albuterol (VENTOLIN HFA) 108 (90 Base) MCG/ACT inhaler Inhale 1-2 puffs into the lungs every 6 (six) hours as needed for wheezing or shortness of breath.   amitriptyline (ELAVIL) 50 MG tablet Take 1 tablet (50 mg total) by mouth at bedtime as needed for sleep. And neuropathy   apixaban (ELIQUIS) 5 MG TABS tablet Take 1 tablet (5 mg total) by mouth 2 (two) times daily.   Cholecalciferol (VITAMIN D-3) 25 MCG (1000 UT) CAPS Take 1 capsule by mouth daily.   cyanocobalamin (VITAMIN B12) 1000 MCG/ML injection INJECT 1 ML INTO MUSCLE EVERY 30 DAYS   diltiazem (CARDIZEM SR) 90 MG 12 hr capsule Take 1 capsule (90 mg total) by mouth every 12 (twelve) hours.   docusate sodium (COLACE) 100 MG capsule Take 100 mg by mouth 2 (two) times daily.   doxycycline (VIBRA-TABS) 100 MG tablet Take 100 mg by mouth 2 (two) times daily.   HYDROcodone bit-homatropine (HYCODAN) 5-1.5 MG/5ML syrup Take 5 mLs by mouth every 6 (six) hours as needed for up to 10 days.   LASIX 20 MG tablet    pantoprazole (PROTONIX) 40 MG tablet TAKE 1 TABLET BY MOUTH EVERY DAY   Respiratory Therapy Supplies (FLUTTER) DEVI Use as directed   sertraline (ZOLOFT) 25 MG tablet Take 1 tablet (25 mg total) by mouth daily.   sotalol (BETAPACE) 80 MG tablet Take 0.5 tablets (40 mg total) by mouth every 12 (twelve) hours.   triamcinolone cream (KENALOG) 0.5 % Apply 1 Application topically 2 (two) times daily as needed. (Patient taking differently: Apply 1 Application topically 2 (two) times daily as needed (for itching).)   levofloxacin (LEVAQUIN) 500 MG tablet Take 1 tablet (500 mg total) by mouth daily for 10 days.   No facility-administered encounter medications on file as of 10/12/2022.  :   Review of Systems:  Out of a complete 14 point review of systems, all are reviewed and negative with  the exception of these symptoms as listed below:  Review of Systems  Neurological:        Patient is here with her daughter for consultation to discuss memory concerns. Pt's daughter states she noticed changes at Thanksgiving but her brother noticed it before then. Memory got worse around Christmas. Patient fell the other day and hit the back of her head on the concrete. She has a large bruise on her left shoulder. She did not seek medical care. Pt's son believes her hearing is affected. Physical therapy advised she mention this today.     Objective:  Neurological Exam  Physical Exam Physical Examination:   Vitals:   10/12/22 1455  BP: 99/62  Pulse: (!) 52    General Examination: The patient is a very pleasant 82 y.o. female in no acute distress. She appears frail and deconditioned, on oxygen via nasal cannula, in a wheelchair.     HEENT: Normocephalic, atraumatic, pupils are equal, round and reactive to light, extraocular tracking is good without limitation to gaze excursion or nystagmus noted. Hearing is grossly intact. Face is symmetric with normal facial animation. Speech is clear with no dysarthria noted. There is no hypophonia. There is no lip, neck/head, jaw or voice tremor. Neck is supple with full range of passive and active motion. There are no carotid bruits on auscultation. Oropharynx exam reveals: moderate mouth dryness, adequate dental hygiene and moderate airway crowding.  Tongue protrudes centrally and palate elevates symmetrically.  Chest: Coarse crackles noted, distant breath sounds.    Heart: S1+S2+0, mildly irregular.     Abdomen: Soft, non-tender and non-distended.  Extremities: There is mild edema both distal lower extremities.    Skin: Warm and dry without trophic changes noted.   Musculoskeletal: exam reveals no obvious joint deformities.   Neurologically:  Mental status: The patient is awake, alert and oriented to situation, self and place.  She is able to  answer questions quite appropriately, details of her history are provided by her daughter.   Thought process is linear. Mood is normal and affect is normal.  Cranial nerves II - XII are as described above under HEENT exam.  Motor exam: Thin bulk, global strength of 4 out of 5, no obvious action or resting tremor.  Mild impairment of fine motor skills and coordination, no lateralization.   Cerebellar testing: No dysmetria or intention tremor. There is no truncal ataxia.  I did not have her walk or stand for me, she is in a wheelchair. Sensory exam: intact to light touch in the upper and lower extremities.   Assessment and Plan:  In summary, Alayla Dethlefs is a very pleasant 82 y.o.-year old female with an underlying complex medical history of chronic kidney disease, arthritis, endometrial cancer, hypertension, hyperlipidemia, reflux disease, interstitial lung disease, chronic respiratory failure, neuropathy, sleep apnea (not currently on PAP therapy), arthritis, recent hospitalization for pneumonia, UTI and sepsis, atrial fibrillation, status post cardioversion in February 2024, and severe obesity with a BMI of over 40, who presents for evaluation of her memory loss of several months duration.  She has multiple vascular risk factors.  She has had recent acute illnesses, was hospitalized in February 2024, is currently on treatment for pneumonia.  She is oxygen dependent.  She has a history of vitamin B12 deficiency, but has not had recent B12 injections per patient and daughter.  We talked about her risk factors for memory loss and contributing factors.  I think it is imperative that she be medically stabilized.  She is advised to make an appointment with  your office for regular B12 injections, make sure that she has cleared her pneumonia.  She is advised to follow-up with her lung doctor to address her untreated obstructive sleep apnea.  Her daughter suspects that she has significant sleep apnea.  She is no  longer AutoPap machine.  We talked about risk factors for vascular dementia.  She does not have strong risk factors for Alzheimer's dementia, may have a mixed picture but certainly medical management is key at this time.  I would avoid donepezil, she has bradycardia.  We can revisit the possibility of utilizing medication for memory loss in a few months, I recommend a follow-up in about 3 to 4 months for recheck.  In the meantime, she is advised to stay well-hydrated, avoid alcohol, avoid caffeine, use her walker at all times.  She had a recent fall and I recommend pursuing a CT scan.  I ordered a head CT without contrast and they can probably get it done today.  We will call with the results. I provided detailed written instructions.  This was an extended visit of over 1 hour with extended chart review involved as well as counseling and coordination of care. I answered all the questions today and the patient and her daughter were in agreement.  Thank you very much for allowing me to participate in the care of this nice patient. If I can be of any further assistance to you please do not hesitate to call me at [phone].  Sincerely,   Huston Foley, MD, PhD

## 2022-10-12 NOTE — Telephone Encounter (Signed)
Moira from [facility] given ok for verbal orders from Dr.John

## 2022-10-12 NOTE — Telephone Encounter (Signed)
medicare/[employer] sup NPR sent to [phone] She can walk in for a CT without an appointment too.

## 2022-10-12 NOTE — Patient Instructions (Signed)
You have complaints of memory loss: memory loss or changes in cognitive function can have many reasons and does not always mean you have dementia. Conditions that can contribute to subjective or objective memory loss include: depression, stress, poor sleep from insomnia or sleep apnea, dehydration, fluctuation in blood sugar values, thyroid or electrolyte dysfunction and certain vitamin deficiencies. Dementia can be caused by stroke, brain atherosclerosis or brain vascular disease due to vascular risk factors (smoking, high blood pressure, high cholesterol, obesity and uncontrolled diabetes), certain degenerative brain disorders (including Parkinson's disease and Multiple sclerosis) and by Alzheimer's disease or other, more rare and sometimes hereditary causes.   I recommend very strongly that you get medically stabilized.  This includes finishing antibiotic treatment for pneumonia, getting your vitamin B12 deficiency addressed with regular B12 injections, please talk to your PCP about resuming injections.  Please stay better hydrated with water as dehydration can also play a role in memory dysfunction and confusion as well as hallucinations.  Please avoid any caffeine or alcohol, drink about 3-4 bottles of water per day, 16.9 ounce size each.  Treating sleep apnea is also important, please follow-up with your pulmonologist and talk about sleep apnea testing and treatment again with them.   Since you had a recent fall, I will order a head CT scan, you can get your CT scan done at [facility].  We can call you with the test results.    Please use your walker at all times.    Please talk to your primary care about the need for amitriptyline as this is a medication that can cause mouth dryness, blurry vision, increased confusion and increased your sleepiness risk and fall risk.  You may be able to reduce the dose.  You have significant vascular risk factors and as such are at risk for vascular dementia.   Your risk factors include hypertension, high cholesterol, sleep apnea, obesity, and prior stroke.    We can continue to monitor.  I do not recommend starting you on any new medication at this time.   I will see you back in about 3 months.

## 2022-10-12 NOTE — Telephone Encounter (Signed)
Please advise for verbal orders 

## 2022-10-12 NOTE — Telephone Encounter (Signed)
Ok for verbals 

## 2022-10-12 NOTE — Telephone Encounter (Signed)
Caller & What Company:  [facility]   Phone Number:  [phone]   Needs Verbal orders for what service & frequency:  Occupational therapy 1 time a week for 4 weeks

## 2022-10-13 ENCOUNTER — Ambulatory Visit
Admission: RE | Admit: 2022-10-13 | Discharge: 2022-10-13 | Disposition: A | Discharge status: Home / Self Care | Payer: Medicare Other | Source: Ambulatory Visit | Attending: Neurology | Admitting: Neurology

## 2022-10-13 ENCOUNTER — Encounter: Payer: Self-pay | Admitting: Neurology

## 2022-10-13 ENCOUNTER — Telehealth: Payer: Self-pay | Admitting: *Deleted

## 2022-10-13 DIAGNOSIS — Z8669 Personal history of other diseases of the nervous system and sense organs: Secondary | ICD-10-CM

## 2022-10-13 DIAGNOSIS — Z9289 Personal history of other medical treatment: Secondary | ICD-10-CM

## 2022-10-13 DIAGNOSIS — Z9181 History of falling: Secondary | ICD-10-CM

## 2022-10-13 DIAGNOSIS — R413 Other amnesia: Secondary | ICD-10-CM | POA: Diagnosis not present

## 2022-10-13 DIAGNOSIS — Z9189 Other specified personal risk factors, not elsewhere classified: Secondary | ICD-10-CM

## 2022-10-13 DIAGNOSIS — E538 Deficiency of other specified B group vitamins: Secondary | ICD-10-CM

## 2022-10-13 DIAGNOSIS — R42 Dizziness and giddiness: Secondary | ICD-10-CM | POA: Diagnosis not present

## 2022-10-13 NOTE — Telephone Encounter (Signed)
-----   Message from Huston Foley, MD sent at 10/13/2022  1:24 PM EDT ----- [facility] wo contrast did not show any acute findings; in particular, no evidence of hemorrhage.  Please update patient or her daughter.

## 2022-10-13 NOTE — Telephone Encounter (Signed)
Called pt's daughter Lupita Leash (on [place]) and advised her that pt's CT head showed no acute findings, in particular, no evidence of hemorrhage. She was very appreciative of the call.

## 2022-10-14 ENCOUNTER — Other Ambulatory Visit: Payer: Self-pay

## 2022-10-14 ENCOUNTER — Encounter (HOSPITAL_COMMUNITY): Payer: Self-pay | Admitting: Emergency Medicine

## 2022-10-14 ENCOUNTER — Emergency Department (HOSPITAL_COMMUNITY): Payer: Medicare Other

## 2022-10-14 ENCOUNTER — Emergency Department (HOSPITAL_COMMUNITY)
Admission: EM | Admit: 2022-10-14 | Discharge: 2022-10-14 | Disposition: A | Discharge status: Home / Self Care | Payer: Medicare Other | Attending: Emergency Medicine | Admitting: Emergency Medicine

## 2022-10-14 ENCOUNTER — Telehealth: Payer: Self-pay | Admitting: Internal Medicine

## 2022-10-14 DIAGNOSIS — R7989 Other specified abnormal findings of blood chemistry: Secondary | ICD-10-CM | POA: Insufficient documentation

## 2022-10-14 DIAGNOSIS — I10 Essential (primary) hypertension: Secondary | ICD-10-CM | POA: Insufficient documentation

## 2022-10-14 DIAGNOSIS — R0602 Shortness of breath: Secondary | ICD-10-CM

## 2022-10-14 DIAGNOSIS — Z79899 Other long term (current) drug therapy: Secondary | ICD-10-CM | POA: Diagnosis not present

## 2022-10-14 LAB — COMPREHENSIVE METABOLIC PANEL
ALT: 12 U/L (ref 0–44)
AST: 22 U/L (ref 15–41)
Albumin: 3.1 g/dL — ABNORMAL LOW (ref 3.5–5.0)
Alkaline Phosphatase: 64 U/L (ref 38–126)
Anion gap: 11 (ref 5–15)
BUN: 19 mg/dL (ref 8–23)
CO2: 31 mmol/L (ref 22–32)
Calcium: 9.3 mg/dL (ref 8.9–10.3)
Chloride: 94 mmol/L — ABNORMAL LOW (ref 98–111)
Creatinine, Ser: 1.02 mg/dL — ABNORMAL HIGH (ref 0.44–1.00)
GFR, Estimated: 55 mL/min — ABNORMAL LOW (ref >60)
Glucose, Bld: 100 mg/dL — ABNORMAL HIGH (ref 70–99)
Potassium: 4.4 mmol/L (ref 3.5–5.1)
Sodium: 136 mmol/L (ref 135–145)
Total Bilirubin: 0.8 mg/dL (ref 0.3–1.2)
Total Protein: 7.2 g/dL (ref 6.5–8.1)

## 2022-10-14 LAB — URINALYSIS, W/ REFLEX TO CULTURE (INFECTION SUSPECTED)
Bilirubin Urine: NEGATIVE
Glucose, UA: NEGATIVE mg/dL
Ketones, ur: NEGATIVE mg/dL
Nitrite: NEGATIVE
Protein, ur: NEGATIVE mg/dL
Specific Gravity, Urine: 1.015 (ref 1.005–1.030)
pH: 5 (ref 5.0–8.0)

## 2022-10-14 LAB — CBC WITH DIFFERENTIAL/PLATELET
Abs Immature Granulocytes: 0.04 10*3/uL (ref 0.00–0.07)
Basophils Absolute: 0 10*3/uL (ref 0.0–0.1)
Basophils Relative: 1 %
Eosinophils Absolute: 0.2 10*3/uL (ref 0.0–0.5)
Eosinophils Relative: 3 %
HCT: 37.1 % (ref 36.0–46.0)
Hemoglobin: 12 g/dL (ref 12.0–15.0)
Immature Granulocytes: 1 %
Lymphocytes Relative: 28 %
Lymphs Abs: 2.1 10*3/uL (ref 0.7–4.0)
MCH: 32.7 pg (ref 26.0–34.0)
MCHC: 32.3 g/dL (ref 30.0–36.0)
MCV: 101.1 fL — ABNORMAL HIGH (ref 80.0–100.0)
Monocytes Absolute: 0.6 10*3/uL (ref 0.1–1.0)
Monocytes Relative: 8 %
Neutro Abs: 4.6 10*3/uL (ref 1.7–7.7)
Neutrophils Relative %: 59 %
Platelets: 166 10*3/uL (ref 150–400)
RBC: 3.67 MIL/uL — ABNORMAL LOW (ref 3.87–5.11)
RDW: 12.5 % (ref 11.5–15.5)
WBC: 7.6 10*3/uL (ref 4.0–10.5)
nRBC: 0 % (ref 0.0–0.2)

## 2022-10-14 LAB — TROPONIN I (HIGH SENSITIVITY)
Troponin I (High Sensitivity): 15 ng/L (ref <18)
Troponin I (High Sensitivity): 13 ng/L (ref <18)

## 2022-10-14 LAB — BRAIN NATRIURETIC PEPTIDE: B Natriuretic Peptide: 178.1 pg/mL — ABNORMAL HIGH (ref 0.0–100.0)

## 2022-10-14 LAB — LIPASE, BLOOD: Lipase: 24 U/L (ref 11–51)

## 2022-10-14 MED ORDER — HYDROCODONE BIT-HOMATROP MBR 5-1.5 MG/5ML PO SOLN: 5.0000 mL | Freq: Four times a day (QID) | ORAL | 0 refills | Status: AC | PRN

## 2022-10-14 NOTE — Discharge Instructions (Addendum)
Your laboratories also are within normal limits today.  Please continue to follow-up with your primary care physician.  You experience any worsening symptoms please return to the emergency department.

## 2022-10-14 NOTE — Telephone Encounter (Signed)
Ok for verbals 

## 2022-10-14 NOTE — ED Notes (Signed)
Patient's pulse oximetry value lowered to 89% while ambulating

## 2022-10-14 NOTE — Telephone Encounter (Signed)
HH ORDERS   Caller Name: [name] Agency Name: [facility] Phone #: [phone])  Service Requested: Home health PT (examples: OT/PT/Skilled Nursing/Social Work/Speech Therapy/Wound Care)  Frequency of Visits: 1X a week for 1 week, 2X a week for 6 weeks, 1X a week for 2 weeks

## 2022-10-14 NOTE — ED Notes (Signed)
Pt

## 2022-10-14 NOTE — ED Triage Notes (Signed)
Pt presents with daughter for worsening shortness of breath and cough, raspy voice, on 3L at baseline

## 2022-10-14 NOTE — ED Notes (Signed)
Pt. Titrated to 2 liters of O2 while holding 100%. Pt. Left on dinamap in lobby to observe

## 2022-10-14 NOTE — ED Notes (Signed)
Pt. Chcked for repeat vitals. O2 read 82%. O2 was given at 6 liters to reach 100%. After 12 minutes, tech turned O2 down to 4. O2 maintained 100%.

## 2022-10-14 NOTE — Telephone Encounter (Signed)
Notified Mark w/MD response../lmb 

## 2022-10-14 NOTE — ED Provider Notes (Signed)
[place] EMERGENCY DEPARTMENT AT [facility] Provider Note   CSN: [id number] Arrival date & time: 10/14/22  1339     History HTN, Depression Chief Complaint  Patient presents with   Shortness of Breath    Kathy [name] is a 82 y.o. female.  82 y.o female with a PMH of HTN, Depression,SOB resents to the ED with a chief complaint of shortness of breath which has been ongoing for about 1 week.  History is mainly provided by daughter, reports that patient was placed on antibiotics approximately 5 days ago, has continuously been taking these antibiotics and there has not been any improvement in symptoms.  Patient now appears hoarse, has a productive cough with some clear phlegm.  She has been using her inhaler, is currently on 2 L via nasal cannula at baseline with an increase in her oxygen requirement.  Also noted to be more disoriented, has had episodes where she wakes up in the middle the night and thinks there is people in her home.  She recently had a CT yesterday of her head which did not show any acute findings.  She has not been running any fevers, no nausea, no vomiting.  The history is provided by the patient.  Shortness of Breath Associated symptoms: no abdominal pain, no chest pain, no fever, no headaches, no sore throat and no vomiting        Home Medications Prior to Admission medications   Medication Sig Start Date End Date Taking? Authorizing Provider  albuterol (VENTOLIN HFA) 108 (90 Base) MCG/ACT inhaler Inhale 1-2 puffs into the lungs every 6 (six) hours as needed for wheezing or shortness of breath. 03/23/21   Corwin Levins, MD  amitriptyline (ELAVIL) 50 MG tablet Take 1 tablet (50 mg total) by mouth at bedtime as needed for sleep. And neuropathy 08/21/22   Zigmund Daniel., MD  apixaban (ELIQUIS) 5 MG TABS tablet Take 1 tablet (5 mg total) by mouth 2 (two) times daily. 03/23/21   Corwin Levins, MD  Cholecalciferol (VITAMIN D-3) 25 MCG (1000 UT) CAPS Take 1  capsule by mouth daily.    [provider]  cyanocobalamin (VITAMIN B12) 1000 MCG/ML injection INJECT 1 ML INTO MUSCLE EVERY 30 DAYS 05/11/22   Corwin Levins, MD  diltiazem (CARDIZEM SR) 90 MG 12 hr capsule Take 1 capsule (90 mg total) by mouth every 12 (twelve) hours. 08/21/22 10/12/22  Zigmund Daniel., MD  docusate sodium (COLACE) 100 MG capsule Take 100 mg by mouth 2 (two) times daily.    [provider]  doxycycline (VIBRA-TABS) 100 MG tablet Take 100 mg by mouth 2 (two) times daily. 10/09/22   [provider]  HYDROcodone bit-homatropine (HYCODAN) 5-1.5 MG/5ML syrup Take 5 mLs by mouth every 6 (six) hours as needed for up to 10 days. 10/14/22 10/24/22  Claude Manges, PA-C  LASIX 20 MG tablet  09/15/22   [provider]  levofloxacin (LEVAQUIN) 500 MG tablet Take 1 tablet (500 mg total) by mouth daily for 10 days. 10/08/22 10/18/22  Corwin Levins, MD  pantoprazole (PROTONIX) 40 MG tablet TAKE 1 TABLET BY MOUTH EVERY DAY 04/12/22   Corwin Levins, MD  Respiratory Therapy Supplies (FLUTTER) DEVI Use as directed 02/13/19   Coral Ceo, NP  sertraline (ZOLOFT) 25 MG tablet Take 1 tablet (25 mg total) by mouth daily. 08/22/22 10/12/22  Zigmund Daniel., MD  sotalol (BETAPACE) 80 MG tablet Take 0.5 tablets (40  mg total) by mouth every 12 (twelve) hours. 08/21/22 10/12/22  Zigmund Daniel., MD  triamcinolone cream (KENALOG) 0.5 % Apply 1 Application topically 2 (two) times daily as needed. Patient taking differently: Apply 1 Application topically 2 (two) times daily as needed (for itching). 06/29/22   Corwin Levins, MD      Allergies    Lipitor [atorvastatin] and Requip [ropinirole hcl]    Review of Systems   Review of Systems  Constitutional:  Negative for chills and fever.  HENT:  Negative for sore throat.   Respiratory:  Positive for shortness of breath.   Cardiovascular:  Negative for chest pain.  Gastrointestinal:  Negative for abdominal pain, nausea  and vomiting.  Genitourinary:  Negative for flank pain.  Musculoskeletal:  Negative for back pain.  Skin:  Negative for pallor and wound.  Neurological:  Negative for light-headedness and headaches.  All other systems reviewed and are negative.   Physical Exam Updated Vital Signs BP (!) 125/53   Pulse (!) 50   Temp 98 F (36.7 C)   Resp 17   Ht 5' (1.524 m)   Wt 95.3 kg   SpO2 100%   BMI 41.01 kg/m  Physical Exam Vitals and nursing note reviewed.  Constitutional:      Appearance: She is well-developed. She is obese.  HENT:     Head: Normocephalic and atraumatic.  Cardiovascular:     Rate and Rhythm: Normal rate.  Pulmonary:     Effort: Pulmonary effort is normal.     Breath sounds: Decreased breath sounds present.  Chest:     Chest wall: No tenderness.  Abdominal:     Palpations: Abdomen is soft.     Tenderness: There is no abdominal tenderness.  Musculoskeletal:     Cervical back: Normal range of motion and neck supple.     Right lower leg: No edema.     Left lower leg: No edema.  Skin:    General: Skin is warm and dry.  Neurological:     Mental Status: She is alert and oriented to person, place, and time.     ED Results / Procedures / Treatments   Labs (all labs ordered are listed, but only abnormal results are displayed) Labs Reviewed  COMPREHENSIVE METABOLIC PANEL - Abnormal; Notable for the following components:      Result Value   Chloride 94 (*)    Glucose, Bld 100 (*)    Creatinine, Ser 1.02 (*)    Albumin 3.1 (*)    GFR, Estimated 55 (*)    All other components within normal limits  CBC WITH DIFFERENTIAL/PLATELET - Abnormal; Notable for the following components:   RBC 3.67 (*)    MCV 101.1 (*)    All other components within normal limits  BRAIN NATRIURETIC PEPTIDE - Abnormal; Notable for the following components:   B Natriuretic Peptide 178.1 (*)    All other components within normal limits  URINALYSIS, W/ REFLEX TO CULTURE (INFECTION  SUSPECTED) - Abnormal; Notable for the following components:   APPearance HAZY (*)    Hgb urine dipstick SMALL (*)    Leukocytes,Ua TRACE (*)    Bacteria, UA RARE (*)    All other components within normal limits  CULTURE, BLOOD (ROUTINE X 2)  CULTURE, BLOOD (ROUTINE X 2)  LIPASE, BLOOD  TROPONIN I (HIGH SENSITIVITY)  TROPONIN I (HIGH SENSITIVITY)    EKG None  Radiology DG Chest 2 View  Result Date: 10/14/2022 CLINICAL DATA:  Shortness of breath EXAM: CHEST - 2 VIEW COMPARISON:  10/06/2022 FINDINGS: Elevated right hemidiaphragm. Extensive interstitial changes identified which could be chronic. Please correlate for history. No consolidation, pneumothorax or effusion. Stable cardiopericardial silhouette with calcified aorta. Surgical clips in the right upper quadrant. Degenerative changes along the spine IMPRESSION: Elevated right hemidiaphragm. Diffuse interstitial changes. Please correlate for a chronic process. No consolidation or effusion. Previous more patchy opacities have improved from previous. Electronically Signed   By: Karen Kays M.D.   On: 10/14/2022 14:37   CT HEAD WO CONTRAST ( )  Result Date: 10/13/2022  [facility] [address], [address] [place], [place] [address] [phone] NEUROIMAGING REPORT STUDY DATE: 10/13/2022 PATIENT NAME: Paxtyn Wisdom DOB: 08/23/1940 MRN: [id number] ORDERING CLINICIAN: Dr. Frances Furbish CLINICAL HISTORY: 82 year old patient being evaluated for memory loss COMPARISON FILMS: MRI brain 08/14/2022 EXAM: CT head without contrast TECHNIQUE: Serial axial, coronal and sagittal  5 mm sections obtained from skull base to the vertex through the brain CONTRAST: None IMAGING SITE: [facility] FINDINGS: The brain parenchyma shows mild age-related changes of chronic small vessel disease and generalized cerebral atrophy.  No structural lesion, tumor or infarct is noted.  The subarachnoid spaces and ventricular system show slight dilatation proportionate  to the degree of atrophy.  Extra-axial brain structures appear normal.  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  The paranasal sinuses appear unremarkable.  Age-appropriate physiologic calcific changes are noted in various structures.   CT scan of the head without contrast showing age-appropriate changes of chronic small vessel disease and generalized cerebral atrophy.  No acute abnormalities are noted. INTERPRETING PHYSICIAN: Delia Heady, MD Certified in  Neuroimaging by American Society of Neuroimaging and [place] [employer] for Neurological [employer]specialities   Procedures Procedures    Medications Ordered in ED Medications - No data to display  ED Course/ Medical Decision Making/ A&P Clinical Course as of 10/14/22 2326  Thu Oct 14, 2022  2307 Leukocytes,Ua(!): TRACE [JS]  2307 Bacteria, UA(!): RARE [JS]    Clinical Course User Index [JS] Claude Manges, PA-C                             Medical Decision Making Amount and/or Complexity of Data Reviewed Labs:  Decision-making details documented in ED Course.  Risk Prescription drug management.    This patient presents to the ED for concern of shortness of breath, this involves a number of treatment options, and is a complaint that carries with it a high risk of complications and morbidity.  The differential diagnosis includes CHF, ACS, versus worsening pneumonia.    Co morbidities: Discussed in HPI   Brief History:  See HPI.   EMR reviewed including pt PMHx, past surgical history and past visits to ER.   See HPI for more details   Lab Tests:  I ordered and independently interpreted labs.  The pertinent results include:    I personally reviewed all laboratory work and imaging. Metabolic panel without any acute abnormality specifically kidney function within normal limits and no significant electrolyte abnormalities. CBC without leukocytosis or significant anemia.  Imaging Studies:  DG Chest  showed: No consolidation or effusion. Previous more patchy opacities have  improved from previous.   Cardiac Monitoring:  The patient was maintained on a cardiac monitor.  I personally viewed and interpreted the cardiac monitored which showed an underlying rhythm of: Sinus bradycardia EKG non-ischemic   Medicines ordered:  N/A  Reevaluation:  After the interventions noted above I re-evaluated patient and found that they have :stayed the same  Social Determinants of Health:  The patient's social determinants of health were a factor in the care of this patient  Problem List / ED Course:  Patient presents to the ED with a chief complaint of shortness of breath has been ongoing for about a week, primary history obtained from the daughter at the bedside who reports patient has been taking her antibiotics but feels that she is not improving.  However on evaluation patient does not appear hypoxic, she is on 2 L nasal cannula at baseline.  She does appear hoarse, and does have a productive cough.  However oxygen is at about 100% it is a febrile.  Interpretation of her blood work will be a CBC that is unremarkable, CMP with no electrolyte derangement, slight elevation in her creatinine but normal.  UA with some rare bacteria.  Lipase levels normal.  Her chest x-ray actually shows improvement of the pneumonia.  She was ambulated by nursing staff with oxygen dropping to 89% on room air, however patient does have improvement of her pneumonia on the x-rays, she is not tachypneic or tachycardic. BNP was also obtained which is a little bit over 100, does not have any signs of fluid overload on exam although she does have a wet cough I do feel that this is more likely due to her ongoing infection.  She is anticoagulated on Eliquis, for I do feel that risk of pulmonary embolism is less likely at this time.  She is here not having any chest pain. Daughter does report some abnormal behavior in the middle of  the night, she did have a CT head yesterday by her primary care physician and had normal findings.  I do feel that she would benefit from an MRI, however I do not feel that this is urgent at this time as she is neurologically intact, ambulating in the ED and has a normal workup thus far.  I discussed with daughter who is visiting here from [place] at length importance of following up with primary care along with returning to the ED if any symptoms worsen.  She should finish antibiotic dosing.  I do not feel that she has failed outpatient treatment as there is improvement of her chest x-ray, along with stable vital signs.  Return precautions discussed at length.  Dispostion:  After consideration of the diagnostic results and the patients response to treatment, I feel that the patent would benefit from continue with antibiotic treatment.    Portions of this note were generated with Scientist, clinical (histocompatibility and immunogenetics). Dictation errors may occur despite best attempts at proofreading.   Final Clinical Impression(s) / ED Diagnoses Final diagnoses:  SOB (shortness of breath)    Rx / DC Orders ED Discharge Orders          Ordered    HYDROcodone bit-homatropine (HYCODAN) 5-1.5 MG/5ML syrup  Every 6 hours PRN        10/14/22 2319              Claude Manges, PA-C 10/14/22 2326    Horton, Mayer Masker, MD 10/16/22 [phone]

## 2022-10-14 NOTE — ED Provider Triage Note (Signed)
Emergency Medicine Provider Triage Evaluation Note  Kathy [name] , a 82 y.o. female  was evaluated in triage.  Pt complains of shortness of breath.  Patient has a history of chronic lung disease and wears 3 L of oxygen at all times.  On 4/12 she was seen by her primary care provider and diagnosed with pneumonia and started on antibiotics, despite taking these over the past week she has continued to have worsening cough and increased shortness of breath.  She is also had some increased waking and confusion overnight, she was recently evaluated for memory changes by neurology.  Family feels that she has gotten worse throughout the week.  Review of Systems  Positive: Shortness of breath, cough, fatigue Negative: Chest pain, nausea, vomiting, fever  Physical Exam  BP (!) 110/51 (BP Location: Right Wrist)   Pulse (!) 51   Temp 97.9 F (36.6 C) (Oral)   Resp 17   SpO2 93%  Gen:   Awake, chronically ill-appearing Resp:  Mildly increased respiratory effort satting 96% on 3 L, frequent cough during exam MSK:   Moves extremities without difficulty  Other:    Medical Decision Making  Medically screening exam initiated at 1:47 PM.  Appropriate orders placed.  Kathy [name] was informed that the remainder of the evaluation will be completed by another provider, this initial triage assessment does not replace that evaluation, and the importance of remaining in the ED until their evaluation is complete.  Recently diagnosed with pneumonia, worsening symptoms despite antibiotics.  Labs and chest x-ray ordered for evaluation of   Legrand Rams 10/14/22 1404

## 2022-10-15 ENCOUNTER — Ambulatory Visit: Payer: Self-pay

## 2022-10-15 NOTE — Patient Outreach (Signed)
  Care Coordination   Follow Up Visit Note   10/15/2022 Name: Obera Stauch MRN: [id number] DOB: 1940-10-08  Syriana Croslin is a 82 y.o. year old female who sees Corwin Levins, MD for primary care. I spoke with Dorcas Mcmurray (daughter-in-law/dpr) and Boyce Medici, Jr(son/dpr) by phone today.  What matters to the patients health and wellness today?  Daughter-in-law, Marylu Lund and son concerned re: level of care. Would like to know options due to patient's memory and confusion: process for getting into SNF/memory care unit. Marylu Lund reports patient's daughter has been staying with her during the night and has observed patient's confusion-reports patient is up during the night wandering, thinking that someone is in the back yard or thinking she has a doctor's appointment. Patient active with HHPT. Marylu Lund is inquiring about possible [facility] nurse. Daughter in law continues to manage patient's medications. They are receptive to social work referral to discuss level of care needs, caregiver stress and discuss options.  Goals Addressed             This Visit's Progress    Care Coordination activities-transition post acute rehab       Interventions Today    Flowsheet Row Most Recent Value  Chronic Disease   Chronic disease during today's visit Other  [cognitive deficit]  General Interventions   General Interventions Discussed/Reviewed General Interventions Reviewed, Communication with, [employer], Doctor Visits  Doctor Visits Discussed/Reviewed Doctor Visits Discussed, Doctor Visits Reviewed  PCP/Specialist Visits Compliance with follow-up visit  Communication with Social Work, PCP/Specialists  [facility] sent message to PCP to update]  Exercise Interventions   Exercise Discussed/Reviewed Exercise Discussed  [confirmed patient is active with  home health PT]  Education Interventions   Education Provided Provided Education  Provided Verbal Education On [employer]  Mental Health Interventions   Mental  Health Discussed/Reviewed Refer to Social Work for resources, Refer to Social Work for counseling  Refer to Social Work for counseling regarding Artist stress]  Refer to Social Work for resources regarding Other, Assisted Banker, Adult Daycare  Pharmacy Interventions   Pharmacy Dicussed/Reviewed Pharmacy Topics Reviewed  Safety Interventions   Safety Discussed/Reviewed Fall Risk, Safety Reviewed            SDOH assessments and interventions completed:  No  Care Coordination Interventions:  Yes, provided   Follow up plan: Follow up call scheduled for 11/03/22    Encounter Outcome:  Pt. Visit Completed   Kathyrn Sheriff, RN, MSN, BSN, CCM [facility] Care Coordinator [phone]

## 2022-10-15 NOTE — Patient Instructions (Addendum)
Visit Information  Thank you for taking time to visit with me today. Please don't hesitate to contact me if I can be of assistance to you.   Following are the goals we discussed today:  Continue to provide patient assistance as needed Discuss health questions/concerns with provider Continue to take medications as prescribed Continue to attend provider visits as scheduled Plan to talk with [name], social worker, scheduled for 10/19/22 at 9:30 am   Our next appointment is by telephone on 11/03/22 at 0945  Please call the care guide team at [phone] if you need to cancel or reschedule your appointment.   If you are experiencing a Mental Health or Behavioral Health Crisis or need someone to talk to, please call the Suicide and Crisis Lifeline: 46  Kathy Sheriff, RN, MSN, BSN, CCM [facility] Care Coordinator [phone]

## 2022-10-18 ENCOUNTER — Telehealth: Payer: Self-pay

## 2022-10-18 LAB — CULTURE, BLOOD (ROUTINE X 2)

## 2022-10-18 NOTE — Telephone Encounter (Signed)
     Patient  visit on 4/18  at [facility]    Have you been able to follow up with your primary care physician? Yes   The patient was or was not able to obtain any needed medicine or equipment. Yes   Are there diet recommendations that you are having difficulty following? Na   Patient expresses understanding of discharge instructions and education provided has no other needs at this time.  Yes     Lenard Forth [facility] Guide, [facility] [phone] [address] [place], [place], [place] [address] Phone: [phone] Email: [email or web address]

## 2022-10-19 ENCOUNTER — Ambulatory Visit: Payer: Self-pay | Admitting: Licensed Clinical Social Worker

## 2022-10-19 LAB — CULTURE, BLOOD (ROUTINE X 2)
Culture: NO GROWTH
Special Requests: ADEQUATE

## 2022-10-19 NOTE — Patient Outreach (Signed)
  Care Coordination   Initial Visit Note   10/19/2022 Name: Kathy [name] MRN: [id number] DOB: [birth year]  Kathy [name] is a 82 y.o. year old female who sees Corwin Levins, MD for primary care. I spoke with  [name] daughter - in -law by phone today.  What matters to the patients health and wellness today?  Provided general information to caregiver for in home and out of home care options.  Family will review all options and discuss to see which one will meet patient's needed.   Goals Addressed             This Visit's Progress    Caregiver support       Activities and task to complete in order to accomplish goals.  Dau.-in-law will assist Call your insurance provider for more information about your Enhanced Benefits (custodial care benefits) Call home health  to follow up on private pay options as well as calling [name] [phone] at [facility]   Review brochure on [employer] this was e-mailed  Review facilities from the list provided call and go visit Review booklet ''When a loved one need long-term care" ( this was e-mailed) Review private pay home care options provided and discussed Complete Guardianship process   Review support group information e-mail for [facility]       SDOH assessments and interventions completed:  No   Care Coordination Interventions:  Yes, provided  Interventions Today    Flowsheet Row Most Recent Value  Chronic Disease   Chronic disease during today's visit Hypertension (HTN), Chronic Kidney Disease/End Stage Renal Disease (ESRD)  General Interventions   General Interventions Discussed/Reviewed General Interventions Reviewed, Level of Care, Communication with  Communication with RN  [Care Manager]  Level of Care Assisted Living, Skilled Nursing Facility  [reviewe levels of care and placement process]  Education Interventions   Education Provided Provided Web-based Education, Provided Education  Provided Verbal Education On  Air traffic controller, [employer], Development worker, community  Mental Health Interventions   Mental Health Discussed/Reviewed --  Teacher, English as a foreign language stress( solution focused,  task center and emotional support)]  Safety Interventions   Safety Discussed/Reviewed Safety Discussed  [name] is home with patient and husband]  Advanced Directive Interventions   Advanced Directives Discussed/Reviewed Advanced Directives Discussed, Guardianship  Guardianship --  [in the process fo getting Guardianship / hearing scheduled April 30th]       Follow up plan: Follow up call scheduled for 10/28/22    Encounter Outcome:  Pt. Visit Completed   [name], LCSW Social Work Care Coordination  [name] [phone]

## 2022-10-19 NOTE — Patient Instructions (Signed)
Visit Information  Thank you for taking time to visit with me today. Please don't hesitate to contact me if I can be of assistance to you.   Following are the goals we discussed today:   Goals Addressed             This Visit's Progress    Caregiver support       Activities and task to complete in order to accomplish goals.  Dau.-in-law will assist Call your insurance provider for more information about your Enhanced Benefits (custodial care benefits) Call home health  to follow up on private pay options as well as calling [name] [phone] at [facility]   Review brochure on [employer] this was e-mailed  Review facilities from the list provided call and go visit Review booklet ''When a loved one need long-term care" ( this was e-mailed) Review private pay home care options provided and discussed Complete Guardianship process   Review support group information e-mail for [facility]        Our next appointment is by telephone on 10/28/22 at 8:30  Please call the care guide team at [phone] if you need to cancel or reschedule your appointment.    Patient's family verbalizes understanding of instructions and care plan provided today and agrees to view in MyChart. Active MyChart status and patient understanding of how to access instructions and care plan via MyChart confirmed with patient.     [name], LCSW Social Work Care Coordination  [name] [phone]

## 2022-10-20 ENCOUNTER — Encounter: Payer: Self-pay | Admitting: Internal Medicine

## 2022-10-20 DIAGNOSIS — J9611 Chronic respiratory failure with hypoxia: Secondary | ICD-10-CM

## 2022-10-20 DIAGNOSIS — J189 Pneumonia, unspecified organism: Secondary | ICD-10-CM | POA: Diagnosis not present

## 2022-10-20 DIAGNOSIS — R269 Unspecified abnormalities of gait and mobility: Secondary | ICD-10-CM

## 2022-10-20 DIAGNOSIS — N390 Urinary tract infection, site not specified: Secondary | ICD-10-CM | POA: Diagnosis not present

## 2022-10-20 DIAGNOSIS — N1831 Chronic kidney disease, stage 3a: Secondary | ICD-10-CM | POA: Diagnosis not present

## 2022-10-20 DIAGNOSIS — I129 Hypertensive chronic kidney disease with stage 1 through stage 4 chronic kidney disease, or unspecified chronic kidney disease: Secondary | ICD-10-CM | POA: Diagnosis not present

## 2022-10-20 DIAGNOSIS — J849 Interstitial pulmonary disease, unspecified: Secondary | ICD-10-CM

## 2022-10-20 DIAGNOSIS — G629 Polyneuropathy, unspecified: Secondary | ICD-10-CM | POA: Diagnosis not present

## 2022-10-20 DIAGNOSIS — M1990 Unspecified osteoarthritis, unspecified site: Secondary | ICD-10-CM | POA: Diagnosis not present

## 2022-10-21 DIAGNOSIS — K219 Gastro-esophageal reflux disease without esophagitis: Secondary | ICD-10-CM | POA: Diagnosis not present

## 2022-10-21 DIAGNOSIS — F32A Depression, unspecified: Secondary | ICD-10-CM | POA: Diagnosis not present

## 2022-10-21 MED ORDER — ALBUTEROL SULFATE HFA 108 (90 BASE) MCG/ACT IN AERS
1.0000 | INHALATION_SPRAY | Freq: Four times a day (QID) | RESPIRATORY_TRACT | 2 refills | Status: DC | PRN
Start: 2022-10-21 — End: 2023-04-18

## 2022-10-21 MED ORDER — SOTALOL HCL 80 MG PO TABS: 40.0000 mg | ORAL_TABLET | Freq: Two times a day (BID) | ORAL | 0 refills | Status: DC

## 2022-10-21 MED ORDER — SERTRALINE HCL 25 MG PO TABS: 25.0000 mg | ORAL_TABLET | Freq: Every day | ORAL | 2 refills | Status: DC

## 2022-10-21 MED ORDER — DOCUSATE SODIUM 100 MG PO CAPS: 100.0000 mg | ORAL_CAPSULE | Freq: Two times a day (BID) | ORAL | 2 refills | Status: DC

## 2022-10-21 NOTE — Telephone Encounter (Signed)
Thanks for doing the refills, but please be aware that sotaolol is normally a medication specifically refilled per cardiology.  thnks

## 2022-10-22 DIAGNOSIS — G629 Polyneuropathy, unspecified: Secondary | ICD-10-CM | POA: Diagnosis not present

## 2022-10-22 DIAGNOSIS — M1990 Unspecified osteoarthritis, unspecified site: Secondary | ICD-10-CM | POA: Diagnosis not present

## 2022-10-22 DIAGNOSIS — I129 Hypertensive chronic kidney disease with stage 1 through stage 4 chronic kidney disease, or unspecified chronic kidney disease: Secondary | ICD-10-CM | POA: Diagnosis not present

## 2022-10-22 DIAGNOSIS — N390 Urinary tract infection, site not specified: Secondary | ICD-10-CM | POA: Diagnosis not present

## 2022-10-22 DIAGNOSIS — J189 Pneumonia, unspecified organism: Secondary | ICD-10-CM | POA: Diagnosis not present

## 2022-10-22 DIAGNOSIS — N1831 Chronic kidney disease, stage 3a: Secondary | ICD-10-CM | POA: Diagnosis not present

## 2022-10-25 DIAGNOSIS — I5020 Unspecified systolic (congestive) heart failure: Secondary | ICD-10-CM

## 2022-10-25 DIAGNOSIS — C541 Malignant neoplasm of endometrium: Secondary | ICD-10-CM

## 2022-10-25 DIAGNOSIS — I4819 Other persistent atrial fibrillation: Secondary | ICD-10-CM

## 2022-10-25 DIAGNOSIS — J849 Interstitial pulmonary disease, unspecified: Secondary | ICD-10-CM

## 2022-10-25 NOTE — Addendum Note (Signed)
Addended by: Corwin Levins on: 10/25/2022 05:09 PM   Modules accepted: Orders

## 2022-10-26 ENCOUNTER — Ambulatory Visit (INDEPENDENT_AMBULATORY_CARE_PROVIDER_SITE_OTHER): Payer: Medicare Other | Admitting: Pulmonary Disease

## 2022-10-26 ENCOUNTER — Encounter: Payer: Self-pay | Admitting: Pulmonary Disease

## 2022-10-26 VITALS — BP 120/66 | HR 56 | Temp 97.7°F | Ht 60.0 in | Wt 212.0 lb

## 2022-10-26 DIAGNOSIS — R0602 Shortness of breath: Secondary | ICD-10-CM

## 2022-10-26 DIAGNOSIS — J849 Interstitial pulmonary disease, unspecified: Secondary | ICD-10-CM | POA: Diagnosis not present

## 2022-10-26 LAB — COMPREHENSIVE METABOLIC PANEL
ALT: 10 U/L (ref 0–35)
AST: 20 U/L (ref 0–37)
Albumin: 3.3 g/dL — ABNORMAL LOW (ref 3.5–5.2)
Alkaline Phosphatase: 65 U/L (ref 39–117)
BUN: 17 mg/dL (ref 6–23)
CO2: 42 mEq/L — ABNORMAL HIGH (ref 19–32)
Calcium: 9.5 mg/dL (ref 8.4–10.5)
Chloride: 90 mEq/L — ABNORMAL LOW (ref 96–112)
Creatinine, Ser: 0.91 mg/dL (ref 0.40–1.20)
GFR: 59.02 mL/min — ABNORMAL LOW (ref >60.00)
Glucose, Bld: 88 mg/dL (ref 70–99)
Potassium: 3.5 mEq/L (ref 3.5–5.1)
Sodium: 140 mEq/L (ref 135–145)
Total Bilirubin: 0.6 mg/dL (ref 0.2–1.2)
Total Protein: 7.1 g/dL (ref 6.0–8.3)

## 2022-10-26 LAB — CBC WITH DIFFERENTIAL/PLATELET
Basophils Absolute: 0 10*3/uL (ref 0.0–0.1)
Basophils Relative: 0.4 % (ref 0.0–3.0)
Eosinophils Absolute: 0.1 10*3/uL (ref 0.0–0.7)
Eosinophils Relative: 1.4 % (ref 0.0–5.0)
HCT: 34.2 % — ABNORMAL LOW (ref 36.0–46.0)
Hemoglobin: 11.4 g/dL — ABNORMAL LOW (ref 12.0–15.0)
Lymphocytes Relative: 20 % (ref 12.0–46.0)
Lymphs Abs: 1.7 10*3/uL (ref 0.7–4.0)
MCHC: 33.2 g/dL (ref 30.0–36.0)
MCV: 97.6 fl (ref 78.0–100.0)
Monocytes Absolute: 0.7 10*3/uL (ref 0.1–1.0)
Monocytes Relative: 7.8 % (ref 3.0–12.0)
Neutro Abs: 5.9 10*3/uL (ref 1.4–7.7)
Neutrophils Relative %: 70.4 % (ref 43.0–77.0)
Platelets: 176 10*3/uL (ref 150.0–400.0)
RBC: 3.51 Mil/uL — ABNORMAL LOW (ref 3.87–5.11)
RDW: 13.1 % (ref 11.5–15.5)
WBC: 8.3 10*3/uL (ref 4.0–10.5)

## 2022-10-26 LAB — CK: Total CK: 22 U/L (ref 7–177)

## 2022-10-26 MED ORDER — PREDNISONE 20 MG PO TABS
20.0000 mg | ORAL_TABLET | Freq: Every day | ORAL | 1 refills | Status: DC
Start: 2022-10-26 — End: 2023-01-06

## 2022-10-26 MED ORDER — AZITHROMYCIN 250 MG PO TABS: ORAL_TABLET | ORAL | 0 refills | Status: DC

## 2022-10-26 NOTE — Patient Instructions (Signed)
I am sorry you are not feeling well today We will start you on prednisone 20 mg a day Monitor your blood sugar while you are on this medication Will check some labs today including connective tissue disease profile, comprehensive metabolic panel, CBC with differential, IgE and BNP for dyspnea Will order a Z-Pak Will order humidifier from the DME company to use with your supplemental oxygen We will schedule you for high-resolution CT Return to clinic in 1 month with virtual visit

## 2022-10-26 NOTE — Progress Notes (Addendum)
Kathy [name]    [id number]    [birth year]  Primary Care Physician:John, Len Blalock, MD  Referring Physician: Corwin Levins, MD [address] [place],  [place] [address]  Chief complaint: Follow up for ILD  HPI: 82 y.o.  with interstitial lung disease, hypertension, hyperlipidemia Followed by Dr. Craige Cotta for ILD, sleep Complains of chronic cough for several years.  States that she has significant dyspnea on exertion.  On 2 L supplemental oxygen She has been treated intermittently with steroids in the past.  Not on any standing therapy.  Developed COVID-19 in January 2021.  She did not require hospitalization Has history of OSA but is not using CPAP on a regular basis.   Pets: She had a cat until 2020, no birds Occupation: Retired Quarry manager Exposures: No known exposure.  No mold, hot tub, Jacuzzi ILD questionnaire 01/30/2020-negative Smoking history: 10-pack-year smoker in her 46s Travel history: No significant travel history Relevant family history: No significant family issue of lung disease  Interim history: Here after a gap of 3 years. She was being evaluated for interstitial lung disease with possible ANA, SSA.  Seen by Dr. Dimple Casey, rheumatology in November of 2021 with no evidence of connective tissue disease.  Has been referred back worsening dyspnea, shortness of breath.  She had an episode of pneumonia in February 2024.  She was hospitalized for community-acquired pneumonia, UTI due to ESBL E. coli, atrial fibrillation with RVR.  Her health has been declining also with increasing gait fullness and possible dementia.  Recommendation has been made for palliative care.  Outpatient Encounter Medications as of 10/26/2022  Medication Sig   albuterol (VENTOLIN HFA) 108 (90 Base) MCG/ACT inhaler Inhale 1-2 puffs into the lungs every 6 (six) hours as needed for wheezing or shortness of breath.   amitriptyline (ELAVIL) 50 MG tablet Take 1 tablet (50 mg total) by mouth at  bedtime as needed for sleep. And neuropathy   apixaban (ELIQUIS) 5 MG TABS tablet Take 1 tablet (5 mg total) by mouth 2 (two) times daily.   Cholecalciferol (VITAMIN D-3) 25 MCG (1000 UT) CAPS Take 1 capsule by mouth daily.   cyanocobalamin (VITAMIN B12) 1000 MCG/ML injection INJECT 1 ML INTO MUSCLE EVERY 30 DAYS   docusate sodium (COLACE) 100 MG capsule Take 1 capsule (100 mg total) by mouth 2 (two) times daily.   LASIX 20 MG tablet Taking extra 1/2 tab for swelling   pantoprazole (PROTONIX) 40 MG tablet TAKE 1 TABLET BY MOUTH EVERY DAY   Respiratory Therapy Supplies (FLUTTER) DEVI Use as directed   sertraline (ZOLOFT) 25 MG tablet Take 1 tablet (25 mg total) by mouth daily.   sotalol (BETAPACE) 80 MG tablet Take 0.5 tablets (40 mg total) by mouth every 12 (twelve) hours.   triamcinolone cream (KENALOG) 0.5 % Apply 1 Application topically 2 (two) times daily as needed. (Patient taking differently: Apply 1 Application topically 2 (two) times daily as needed (for itching).)   diltiazem (CARDIZEM SR) 90 MG 12 hr capsule Take 1 capsule (90 mg total) by mouth every 12 (twelve) hours.   [DISCONTINUED] doxycycline (VIBRA-TABS) 100 MG tablet Take 100 mg by mouth 2 (two) times daily.   No facility-administered encounter medications on file as of 10/26/2022.   Physical Exam: Blood pressure 120/66, pulse (!) 56, temperature 97.7 F (36.5 C), temperature source Oral, height 5' (1.524 m), weight 212 lb (96.2 kg), SpO2 92 %. Gen:      No  acute distress, frail, elderly. HEENT:  EOMI, sclera anicteric Neck:     No masses; no thyromegaly Lungs:    Clear to auscultation bilaterally; normal respiratory effort CV:         Regular rate and rhythm; no murmurs Abd:      + bowel sounds; soft, non-tender; no palpable masses, no distension Ext:    No edema; adequate peripheral perfusion Skin:      Warm and dry; no rash Neuro: alert and oriented x 3 Psych: normal mood and affect   Data Reviewed: Imaging: HRCT  02/22/2017-patchy air trapping with reticulation, groundglass with traction bronchiectasis.  High-resolution CT scan 01/11/2018-prominent patchy airspace disease with out trapping, subpleural reticulation, groundglass with no basal gradient Findings suggestive of chronic HP with mild worsening compared to 2018.  Dilated pulmonary artery  High-resolution CT 02/08/2020-findings suggestive of chronic HP, minimally progressed compared to 2019.  CT chest 08/14/2022-worsening interstitial lung disease small consolidation in the right upper lobe. I have reviewed the images personally.  PFTs: 03/14/2018 FVC 1.35 [60%], FEV1 1.27 [75%], F/F 94, DLCO 11.23 [59%] Mild restriction on spirometry, mild to moderate diffusion defect.  Worsened since 2018  04/04/2020 FVC 1.33 [61%], FEV1 1.28 [79%], F/F96, TLC 2.49 [55%], DLCO 8.68 [52%] Severe restriction, moderate-severe diffusion defect.  Labs: CTD serologies 03/01/2017-negative ANA, CCP, rule,, RA, SSA, SSB Hypersensitivity panel 03/15/2017 and 07/20/17-negative  Repeat CTD serologies ANA 1;320, positive SSA Hypersensitivity panel 01/30/2020-negative  N-terminal proBNP 02/13/2019-303  Cardiac: Echocardiogram 02/18/2020 LVEF 45-50% with global hypokinesis, mildly elevated pulmonary hypertension with RVSP 36  Assessment:  Interstitial lung disease CT has appearance of chronic HP with prominent air trapping although extensive history taking and ILD questionnaire does not reveal any significant exposures.  Positive ANA and SSA may indicate an autoimmune process. She also had COVID-19 in January 2021 which may have muddied the water and cause progression of baseline ILD.  Now returns to clinic after 2021 with worsening dyspnea.  For CT shows increased interstitial opacities and I suspect that she may have worsening interstitial lung disease with flare. Started on prednisone 20 mg a day.  Avoid higher dose as she has side effects with steroids Monitor blood  sugar while on this medication Recheck connective tissue disease profile, baseline labs including complete metabolic panel, CBC, IgE, BNP Order Z-Pak as she has cough with mucus production Continue supplemental oxygen.  Order humidifier Repeat high-res CT  Cardiomyopathy with pulmonary hypertension Mild atrial fibrillation with RVR Follow-up with cardiology  Plan/Recommendations: Prednisone Repeat labs Z-Pak Supplemental oxygen with humidifier High-res CT  Chilton Greathouse MD [facility] 10/26/2022, 1:36 PM  CC: Corwin Levins, MD

## 2022-10-26 NOTE — Telephone Encounter (Signed)
If home health is not involved, I would take the script to a furniture store or medical supply store ,  thanks

## 2022-10-27 ENCOUNTER — Other Ambulatory Visit: Payer: Medicare Other

## 2022-10-27 ENCOUNTER — Telehealth: Payer: Self-pay | Admitting: Internal Medicine

## 2022-10-27 ENCOUNTER — Telehealth: Payer: Self-pay

## 2022-10-27 DIAGNOSIS — N390 Urinary tract infection, site not specified: Secondary | ICD-10-CM | POA: Diagnosis not present

## 2022-10-27 DIAGNOSIS — N1831 Chronic kidney disease, stage 3a: Secondary | ICD-10-CM | POA: Diagnosis not present

## 2022-10-27 DIAGNOSIS — I129 Hypertensive chronic kidney disease with stage 1 through stage 4 chronic kidney disease, or unspecified chronic kidney disease: Secondary | ICD-10-CM | POA: Diagnosis not present

## 2022-10-27 DIAGNOSIS — G629 Polyneuropathy, unspecified: Secondary | ICD-10-CM | POA: Diagnosis not present

## 2022-10-27 DIAGNOSIS — J189 Pneumonia, unspecified organism: Secondary | ICD-10-CM | POA: Diagnosis not present

## 2022-10-27 DIAGNOSIS — M1990 Unspecified osteoarthritis, unspecified site: Secondary | ICD-10-CM | POA: Diagnosis not present

## 2022-10-27 LAB — ANTI-SCLERODERMA ANTIBODY: Scleroderma (Scl-70) (ENA) Antibody, IgG: <1 AI

## 2022-10-27 LAB — SJOGREN'S SYNDROME ANTIBODS(SSA + SSB): SSA (Ro) (ENA) Antibody, IgG: <1 AI

## 2022-10-27 LAB — ANTI-DNA ANTIBODY, DOUBLE-STRANDED: ds DNA Ab: <1 IU/mL

## 2022-10-27 NOTE — Telephone Encounter (Signed)
Patient's daughter called and requested an order for a wheelchair for the patient.  Best callback for Lupita Leash is [phone].

## 2022-10-27 NOTE — Telephone Encounter (Signed)
Called Pt and spoke with daughter , she will be coming today to pick up order form and it has been placed up front.

## 2022-10-28 ENCOUNTER — Ambulatory Visit: Payer: Self-pay | Admitting: Licensed Clinical Social Worker

## 2022-10-28 DIAGNOSIS — N390 Urinary tract infection, site not specified: Secondary | ICD-10-CM | POA: Diagnosis not present

## 2022-10-28 DIAGNOSIS — N1831 Chronic kidney disease, stage 3a: Secondary | ICD-10-CM | POA: Diagnosis not present

## 2022-10-28 DIAGNOSIS — M1990 Unspecified osteoarthritis, unspecified site: Secondary | ICD-10-CM | POA: Diagnosis not present

## 2022-10-28 DIAGNOSIS — J189 Pneumonia, unspecified organism: Secondary | ICD-10-CM | POA: Diagnosis not present

## 2022-10-28 DIAGNOSIS — I129 Hypertensive chronic kidney disease with stage 1 through stage 4 chronic kidney disease, or unspecified chronic kidney disease: Secondary | ICD-10-CM | POA: Diagnosis not present

## 2022-10-28 DIAGNOSIS — G629 Polyneuropathy, unspecified: Secondary | ICD-10-CM | POA: Diagnosis not present

## 2022-10-28 LAB — IGE: IgE (Immunoglobulin E), Serum: 42 kU/L (ref <=114)

## 2022-10-28 LAB — RHEUMATOID FACTOR: Rheumatoid fact SerPl-aCnc: <10 IU/mL (ref <14)

## 2022-10-28 LAB — ANA: Anti Nuclear Antibody (ANA): POSITIVE — AB

## 2022-10-28 LAB — CYCLIC CITRUL PEPTIDE ANTIBODY, IGG: Cyclic Citrullin Peptide Ab: <16 UNITS

## 2022-10-28 LAB — ANTI-NUCLEAR AB-TITER (ANA TITER): ANA Titer 1: 1:160 {titer} — ABNORMAL HIGH

## 2022-10-28 LAB — SJOGREN'S SYNDROME ANTIBODS(SSA + SSB): SSB (La) (ENA) Antibody, IgG: <1 AI

## 2022-10-28 LAB — ALDOLASE: Aldolase: 5.7 U/L (ref <=8.1)

## 2022-10-28 NOTE — Patient Outreach (Signed)
  Care Coordination  Follow Up Visit Note   10/28/2022 Name: Kathy [name] MRN: [id number] DOB: 08/02/1940  Kathy [name] is a 82 y.o. year old female who sees Corwin Levins, MD for primary care. I spoke with  Kathy [name] son  Kathy [name] and daughter -in-law Kathy [name] by phone today.  What matters to the patients health and wellness today?  Keeping patient safe and making sure her needs are being met    Goals Addressed             This Visit's Progress    Caregiver support       Activities and task to complete in order to accomplish goals.  Dau.-in-law will assist Call your insurance provider for more information about your Enhanced Benefits (custodial care benefits) Call home health  to follow up on private pay options as well as calling [name] [phone] at [facility]   I am glad you were able to get on the wait list with [facility] Continue to review facilities from the list provided call and go visit Keep the booklet ''When a loved one need long-term care" ( this was e-mailed) At this time you have decided that private pay home care is not a options  Complete to work on the [employer] and HPOA       SDOH assessments and interventions completed:  No   Care Coordination Interventions:  Yes, provided  Interventions Today    Flowsheet Row Most Recent Value  Chronic Disease   Chronic disease during today's visit Hypertension (HTN), Congestive Heart Failure (CHF), Chronic Kidney Disease/End Stage Renal Disease (ESRD)  General Interventions   General Interventions Discussed/Reviewed General Interventions Reviewed, Level of Care, Community Resources  Level of Care Assisted Living  Education Interventions   Education Provided Provided Education  Provided Verbal Education On Community Resources  Mental Health Interventions   Mental Health Discussed/Reviewed Other  [solution focused, task centered, active listening]  Safety Interventions   Safety Discussed/Reviewed Safety Reviewed   Advanced Directive Interventions   Advanced Directives Discussed/Reviewed Advanced Directives Reviewed, Advanced Care Planning       Follow up plan: Follow up call scheduled for 11/19/22    Encounter Outcome:  Pt. Visit Completed   [name], LCSW Social Work Care Coordination  [name] [phone]

## 2022-10-28 NOTE — Patient Instructions (Signed)
Visit Information  Thank you for taking time to visit with me today. Please don't hesitate to contact me if I can be of assistance to you.   Following are the goals we discussed today:   Goals Addressed             This Visit's Progress    Caregiver support       Activities and task to complete in order to accomplish goals.  Dau.-in-law will assist Call your insurance provider for more information about your Enhanced Benefits (custodial care benefits) Call home health  to follow up on private pay options as well as calling [name] [phone] at [facility]   I am glad you were able to get on the wait list with [facility] Continue to review facilities from the list provided call and go visit Keep the booklet ''When a loved one need long-term care" ( this was e-mailed) At this time you have decided that private pay home care is not a options  Complete to work on the POA and HPOA        Our next appointment is by telephone on 11/19/22 at 8:30  Please call the care guide team at [phone] if you need to cancel or reschedule your appointment.    Patient's family verbalizes understanding of instructions and care plan provided today and agrees to view in MyChart. Active MyChart status and patient understanding of how to access instructions and care plan via MyChart confirmed with patient.     [name], LCSW Social Work Care Coordination  [name] [phone]

## 2022-10-29 DIAGNOSIS — N390 Urinary tract infection, site not specified: Secondary | ICD-10-CM | POA: Diagnosis not present

## 2022-10-29 DIAGNOSIS — N1831 Chronic kidney disease, stage 3a: Secondary | ICD-10-CM | POA: Diagnosis not present

## 2022-10-29 DIAGNOSIS — M1990 Unspecified osteoarthritis, unspecified site: Secondary | ICD-10-CM | POA: Diagnosis not present

## 2022-10-29 DIAGNOSIS — I129 Hypertensive chronic kidney disease with stage 1 through stage 4 chronic kidney disease, or unspecified chronic kidney disease: Secondary | ICD-10-CM | POA: Diagnosis not present

## 2022-10-29 DIAGNOSIS — G629 Polyneuropathy, unspecified: Secondary | ICD-10-CM | POA: Diagnosis not present

## 2022-10-29 DIAGNOSIS — J189 Pneumonia, unspecified organism: Secondary | ICD-10-CM | POA: Diagnosis not present

## 2022-10-30 LAB — HYPERSENSITIVITY PNEUMONITIS
A. Pullulans Abs: NEGATIVE
A.Fumigatus #1 Abs: NEGATIVE
Micropolyspora faeni, IgG: NEGATIVE
Pigeon Serum Abs: NEGATIVE
Thermoact. Saccharii: NEGATIVE
Thermoactinomyces vulgaris, IgG: NEGATIVE

## 2022-10-30 LAB — PRO B NATRIURETIC PEPTIDE: NT-Pro BNP: 1073 pg/mL — ABNORMAL HIGH (ref 0–738)

## 2022-10-30 LAB — RNP ANTIBODIES: ENA RNP Ab: 0.2 AI (ref 0.0–0.9)

## 2022-11-01 DIAGNOSIS — N390 Urinary tract infection, site not specified: Secondary | ICD-10-CM | POA: Diagnosis not present

## 2022-11-01 DIAGNOSIS — I129 Hypertensive chronic kidney disease with stage 1 through stage 4 chronic kidney disease, or unspecified chronic kidney disease: Secondary | ICD-10-CM | POA: Diagnosis not present

## 2022-11-01 DIAGNOSIS — G629 Polyneuropathy, unspecified: Secondary | ICD-10-CM | POA: Diagnosis not present

## 2022-11-01 DIAGNOSIS — M1990 Unspecified osteoarthritis, unspecified site: Secondary | ICD-10-CM | POA: Diagnosis not present

## 2022-11-01 DIAGNOSIS — J189 Pneumonia, unspecified organism: Secondary | ICD-10-CM | POA: Diagnosis not present

## 2022-11-01 DIAGNOSIS — N1831 Chronic kidney disease, stage 3a: Secondary | ICD-10-CM | POA: Diagnosis not present

## 2022-11-03 ENCOUNTER — Ambulatory Visit: Payer: Self-pay

## 2022-11-03 DIAGNOSIS — N1831 Chronic kidney disease, stage 3a: Secondary | ICD-10-CM | POA: Diagnosis not present

## 2022-11-03 DIAGNOSIS — M1990 Unspecified osteoarthritis, unspecified site: Secondary | ICD-10-CM | POA: Diagnosis not present

## 2022-11-03 DIAGNOSIS — G629 Polyneuropathy, unspecified: Secondary | ICD-10-CM | POA: Diagnosis not present

## 2022-11-03 DIAGNOSIS — J189 Pneumonia, unspecified organism: Secondary | ICD-10-CM | POA: Diagnosis not present

## 2022-11-03 DIAGNOSIS — N390 Urinary tract infection, site not specified: Secondary | ICD-10-CM | POA: Diagnosis not present

## 2022-11-03 DIAGNOSIS — I129 Hypertensive chronic kidney disease with stage 1 through stage 4 chronic kidney disease, or unspecified chronic kidney disease: Secondary | ICD-10-CM | POA: Diagnosis not present

## 2022-11-03 NOTE — Patient Outreach (Unsigned)
  Care Coordination   Follow Up Visit Note   11/03/2022 Name: Kathy [name] MRN: [id number] DOB: 23-Jan-1941  Kathy [name] is a 82 y.o. year old female who sees Corwin Levins, MD for primary care. I spoke with Dorcas Mcmurray (dpr) by phone today.  What matters to the patients health and wellness today?  RNCM spoke with Dorcas Mcmurray, daughter in law (dpr). She reports that patient continues with home health therapy. And reports they are working on getting HCPOA and guardianship. Daughter in law continues to assist patient as needed and assist with medication management. Daughter-in-law is interested in a Palliative care referral if provider agrees.  Goals Addressed             This Visit's Progress    Care Coordination activities-transition post acute rehab       Interventions Today    Flowsheet Row Most Recent Value  Chronic Disease   Chronic disease during today's visit Other  [Interstitial Lung Disease, cognitive decline]  General Interventions   General Interventions Discussed/Reviewed General Interventions Reviewed, Communication with  Doctor Visits Discussed/Reviewed Doctor Visits Discussed  Communication with PCP/Specialists  [re: request for Palliative care consult]  Exercise Interventions   Exercise Discussed/Reviewed Exercise Discussed  [continues to be active with home health therapy]  Pharmacy Interventions   Pharmacy Dicussed/Reviewed Pharmacy Topics Discussed  Safety Interventions   Safety Discussed/Reviewed Safety Discussed, Fall Risk, Home Safety  Home Safety --  [name] been referred to [facility]]  Advanced Directive Interventions   Advanced Directives Discussed/Reviewed Guardianship, Advanced Directives Discussed            SDOH assessments and interventions completed:  No  Care Coordination Interventions:  Yes, provided   Follow up plan:  12/03/22    Encounter Outcome:  Pt. Visit Completed   Kathyrn Sheriff, RN, MSN, BSN, CCM [facility] Care Coordinator [phone]

## 2022-11-03 NOTE — Patient Instructions (Addendum)
Visit Information  Thank you for taking time to visit with me today. Please don't hesitate to contact me if I can be of assistance to you.   Following are the goals we discussed today:  Continue to assist patient with health care needs Continue to participate with home health therapy as recommended Continue to work with [name], LCSW for care giver support Continue to work establishing a Healthcare Power of [place] and [place] Contact provider if condition worsens or with health questions or concerns   Our next appointment is by telephone on 12/03/22 at 9 am  Please call the care guide team at [phone] if you need to cancel or reschedule your appointment.   If you are experiencing a Mental Health or Behavioral Health Crisis or need someone to talk to, please call the Suicide and Crisis Lifeline: 101  Kathyrn Sheriff, RN, MSN, BSN, CCM [facility] Care Coordinator [phone]

## 2022-11-04 ENCOUNTER — Telehealth: Payer: Self-pay

## 2022-11-04 ENCOUNTER — Ambulatory Visit: Payer: Medicare Other | Admitting: Family Medicine

## 2022-11-04 DIAGNOSIS — N1831 Chronic kidney disease, stage 3a: Secondary | ICD-10-CM | POA: Diagnosis not present

## 2022-11-04 DIAGNOSIS — J8410 Pulmonary fibrosis, unspecified: Secondary | ICD-10-CM | POA: Diagnosis not present

## 2022-11-04 DIAGNOSIS — N390 Urinary tract infection, site not specified: Secondary | ICD-10-CM | POA: Diagnosis not present

## 2022-11-04 DIAGNOSIS — I129 Hypertensive chronic kidney disease with stage 1 through stage 4 chronic kidney disease, or unspecified chronic kidney disease: Secondary | ICD-10-CM | POA: Diagnosis not present

## 2022-11-04 DIAGNOSIS — Z7901 Long term (current) use of anticoagulants: Secondary | ICD-10-CM | POA: Diagnosis not present

## 2022-11-04 DIAGNOSIS — F32A Depression, unspecified: Secondary | ICD-10-CM | POA: Diagnosis not present

## 2022-11-04 DIAGNOSIS — E785 Hyperlipidemia, unspecified: Secondary | ICD-10-CM | POA: Diagnosis not present

## 2022-11-04 DIAGNOSIS — J189 Pneumonia, unspecified organism: Secondary | ICD-10-CM | POA: Diagnosis not present

## 2022-11-04 DIAGNOSIS — R32 Unspecified urinary incontinence: Secondary | ICD-10-CM | POA: Diagnosis not present

## 2022-11-04 DIAGNOSIS — E7800 Pure hypercholesterolemia, unspecified: Secondary | ICD-10-CM | POA: Diagnosis not present

## 2022-11-04 DIAGNOSIS — K2100 Gastro-esophageal reflux disease with esophagitis, without bleeding: Secondary | ICD-10-CM | POA: Diagnosis not present

## 2022-11-04 DIAGNOSIS — K5900 Constipation, unspecified: Secondary | ICD-10-CM | POA: Diagnosis not present

## 2022-11-04 DIAGNOSIS — G629 Polyneuropathy, unspecified: Secondary | ICD-10-CM | POA: Diagnosis not present

## 2022-11-04 DIAGNOSIS — Z87891 Personal history of nicotine dependence: Secondary | ICD-10-CM | POA: Diagnosis not present

## 2022-11-04 DIAGNOSIS — M1990 Unspecified osteoarthritis, unspecified site: Secondary | ICD-10-CM | POA: Diagnosis not present

## 2022-11-04 DIAGNOSIS — G9341 Metabolic encephalopathy: Secondary | ICD-10-CM | POA: Diagnosis not present

## 2022-11-04 DIAGNOSIS — G4730 Sleep apnea, unspecified: Secondary | ICD-10-CM | POA: Diagnosis not present

## 2022-11-04 DIAGNOSIS — J849 Interstitial pulmonary disease, unspecified: Secondary | ICD-10-CM | POA: Diagnosis not present

## 2022-11-04 DIAGNOSIS — Z9981 Dependence on supplemental oxygen: Secondary | ICD-10-CM | POA: Diagnosis not present

## 2022-11-04 NOTE — Telephone Encounter (Signed)
-----   Message from Colletta [place], RN sent at 11/04/2022 11:59 AM EDT ----- Regarding: request for Palliative Care Referral Hi Dr. Jonny Ruiz,  I spoke with Mrs. Tussing daughter in law, Mahlet Renzo and she is interested in a Palliative care referral. Marylu Lund states she is familiar and has used [facility] in the past for other family members and would like to use them again. If you agree, can you or your staff send a referral to [facility] for Palliative Care. Thank you.  Kathyrn Sheriff, RN, MSN, BSN, CCM [facility] Care Coordinator [phone]

## 2022-11-04 NOTE — Telephone Encounter (Signed)
Patient Advocate Encounter  Received a fax from [facility] regarding Prior Authorization for HYDROcodone Bit-Homatrop MBr 5-1.5MG /5ML solution .   Authorization has been DENIED due to    Determination letter attached to patient chart

## 2022-11-05 NOTE — Telephone Encounter (Signed)
Canceled appointment 11/04/22, did not r/s.

## 2022-11-08 DIAGNOSIS — M1990 Unspecified osteoarthritis, unspecified site: Secondary | ICD-10-CM | POA: Diagnosis not present

## 2022-11-08 DIAGNOSIS — G629 Polyneuropathy, unspecified: Secondary | ICD-10-CM | POA: Diagnosis not present

## 2022-11-08 DIAGNOSIS — N390 Urinary tract infection, site not specified: Secondary | ICD-10-CM | POA: Diagnosis not present

## 2022-11-08 DIAGNOSIS — I129 Hypertensive chronic kidney disease with stage 1 through stage 4 chronic kidney disease, or unspecified chronic kidney disease: Secondary | ICD-10-CM | POA: Diagnosis not present

## 2022-11-08 DIAGNOSIS — N1831 Chronic kidney disease, stage 3a: Secondary | ICD-10-CM | POA: Diagnosis not present

## 2022-11-08 DIAGNOSIS — J189 Pneumonia, unspecified organism: Secondary | ICD-10-CM | POA: Diagnosis not present

## 2022-11-11 DIAGNOSIS — N390 Urinary tract infection, site not specified: Secondary | ICD-10-CM | POA: Diagnosis not present

## 2022-11-11 DIAGNOSIS — M1990 Unspecified osteoarthritis, unspecified site: Secondary | ICD-10-CM | POA: Diagnosis not present

## 2022-11-11 DIAGNOSIS — J189 Pneumonia, unspecified organism: Secondary | ICD-10-CM | POA: Diagnosis not present

## 2022-11-11 DIAGNOSIS — I129 Hypertensive chronic kidney disease with stage 1 through stage 4 chronic kidney disease, or unspecified chronic kidney disease: Secondary | ICD-10-CM | POA: Diagnosis not present

## 2022-11-11 DIAGNOSIS — N1831 Chronic kidney disease, stage 3a: Secondary | ICD-10-CM | POA: Diagnosis not present

## 2022-11-11 DIAGNOSIS — G629 Polyneuropathy, unspecified: Secondary | ICD-10-CM | POA: Diagnosis not present

## 2022-11-12 DIAGNOSIS — J189 Pneumonia, unspecified organism: Secondary | ICD-10-CM | POA: Diagnosis not present

## 2022-11-12 DIAGNOSIS — M1990 Unspecified osteoarthritis, unspecified site: Secondary | ICD-10-CM | POA: Diagnosis not present

## 2022-11-12 DIAGNOSIS — N1831 Chronic kidney disease, stage 3a: Secondary | ICD-10-CM | POA: Diagnosis not present

## 2022-11-12 DIAGNOSIS — N390 Urinary tract infection, site not specified: Secondary | ICD-10-CM | POA: Diagnosis not present

## 2022-11-12 DIAGNOSIS — I129 Hypertensive chronic kidney disease with stage 1 through stage 4 chronic kidney disease, or unspecified chronic kidney disease: Secondary | ICD-10-CM | POA: Diagnosis not present

## 2022-11-12 DIAGNOSIS — G629 Polyneuropathy, unspecified: Secondary | ICD-10-CM | POA: Diagnosis not present

## 2022-11-15 DIAGNOSIS — I129 Hypertensive chronic kidney disease with stage 1 through stage 4 chronic kidney disease, or unspecified chronic kidney disease: Secondary | ICD-10-CM | POA: Diagnosis not present

## 2022-11-15 DIAGNOSIS — N390 Urinary tract infection, site not specified: Secondary | ICD-10-CM | POA: Diagnosis not present

## 2022-11-15 DIAGNOSIS — G629 Polyneuropathy, unspecified: Secondary | ICD-10-CM | POA: Diagnosis not present

## 2022-11-15 DIAGNOSIS — J189 Pneumonia, unspecified organism: Secondary | ICD-10-CM | POA: Diagnosis not present

## 2022-11-15 DIAGNOSIS — M1990 Unspecified osteoarthritis, unspecified site: Secondary | ICD-10-CM | POA: Diagnosis not present

## 2022-11-15 DIAGNOSIS — N1831 Chronic kidney disease, stage 3a: Secondary | ICD-10-CM | POA: Diagnosis not present

## 2022-11-17 DIAGNOSIS — N1831 Chronic kidney disease, stage 3a: Secondary | ICD-10-CM | POA: Diagnosis not present

## 2022-11-17 DIAGNOSIS — M1990 Unspecified osteoarthritis, unspecified site: Secondary | ICD-10-CM | POA: Diagnosis not present

## 2022-11-17 DIAGNOSIS — G629 Polyneuropathy, unspecified: Secondary | ICD-10-CM | POA: Diagnosis not present

## 2022-11-17 DIAGNOSIS — J189 Pneumonia, unspecified organism: Secondary | ICD-10-CM | POA: Diagnosis not present

## 2022-11-17 DIAGNOSIS — I129 Hypertensive chronic kidney disease with stage 1 through stage 4 chronic kidney disease, or unspecified chronic kidney disease: Secondary | ICD-10-CM | POA: Diagnosis not present

## 2022-11-17 DIAGNOSIS — N390 Urinary tract infection, site not specified: Secondary | ICD-10-CM | POA: Diagnosis not present

## 2022-11-18 ENCOUNTER — Other Ambulatory Visit: Payer: Self-pay | Admitting: Internal Medicine

## 2022-11-18 ENCOUNTER — Encounter: Payer: Self-pay | Admitting: Internal Medicine

## 2022-11-18 NOTE — Telephone Encounter (Signed)
Patient also needs - Vitamin D3 1000 it, sotalol 890 mg.

## 2022-11-18 NOTE — Telephone Encounter (Signed)
error 

## 2022-11-19 ENCOUNTER — Ambulatory Visit: Payer: Self-pay | Admitting: Licensed Clinical Social Worker

## 2022-11-19 ENCOUNTER — Other Ambulatory Visit: Payer: Self-pay | Admitting: Internal Medicine

## 2022-11-19 ENCOUNTER — Other Ambulatory Visit: Payer: Self-pay

## 2022-11-19 DIAGNOSIS — I129 Hypertensive chronic kidney disease with stage 1 through stage 4 chronic kidney disease, or unspecified chronic kidney disease: Secondary | ICD-10-CM | POA: Diagnosis not present

## 2022-11-19 DIAGNOSIS — G629 Polyneuropathy, unspecified: Secondary | ICD-10-CM | POA: Diagnosis not present

## 2022-11-19 DIAGNOSIS — N390 Urinary tract infection, site not specified: Secondary | ICD-10-CM | POA: Diagnosis not present

## 2022-11-19 DIAGNOSIS — J189 Pneumonia, unspecified organism: Secondary | ICD-10-CM | POA: Diagnosis not present

## 2022-11-19 DIAGNOSIS — N1831 Chronic kidney disease, stage 3a: Secondary | ICD-10-CM | POA: Diagnosis not present

## 2022-11-19 DIAGNOSIS — M1990 Unspecified osteoarthritis, unspecified site: Secondary | ICD-10-CM | POA: Diagnosis not present

## 2022-11-19 NOTE — Patient Instructions (Signed)
Social Work Visit Information  Thank you for taking time to visit with me today. Please don't hesitate to contact me if I can be of assistance to you.   Following are the goals we discussed today:   Goals Addressed             This Visit's Progress    COMPLETED: Caregiver support       Activities and task to complete in order to accomplish goals.  Dau.-in-law will assist Call your insurance provider for more information about your Enhanced Benefits (custodial care benefits) Call home health  to follow up on private pay options as well as calling [name] [phone] at [facility]   Continue to follow up [facility] if no return call in about 40 days Continue to review facilities from the list provided call and go visit Keep the booklet ''When a loved one need long-term care" ( this was e-mailed) Continue to explore private pay home care options        : Family does not desire continued follow-up by social work. They will contact the office if needed  Please call the care guide team at [phone] if you need to cancel or reschedule your appointment.   If you or anyone you know are experiencing a Mental Health or Behavioral Health Crisis or need someone to talk to, please call the Suicide and Crisis Lifeline: 988 call the [place] National Suicide Prevention Lifeline: 269-417-6246 or TTY: 973-757-0537 TTY 718-780-4745) to talk to a trained counselor call 1-800-273-TALK (toll free, 24 hour hotline) go to [facility] [address], [place] [phone])   Patient verbalizes understanding of instructions and care plan provided today and agrees to view in MyChart. Active MyChart status and patient understanding of how to access instructions and care plan via MyChart confirmed with patient.       [name], LCSW Social Work Care Coordination  [name] [phone]

## 2022-11-19 NOTE — Patient Outreach (Signed)
  Care Coordination  Follow Up Visit Note   11/19/2022 Name: Kathy [name] MRN: [id number] DOB: 1941/05/24  Kathy [name] is a 82 y.o. year old female who sees Corwin Levins, MD for primary care. I spoke with  [name] son [name] and daughter-in-law by phone today.  What matters to the patients health and wellness today?  Keeping patient safe  Family is making progress with goals and believes they are at a good place.  They have declined ongoing social work support but will contact LCSW if needed.   Goals Addressed             This Visit's Progress    COMPLETED: Caregiver support       Activities and task to complete in order to accomplish goals.  Dau.-in-law will assist Call your insurance provider for more information about your Enhanced Benefits (custodial care benefits) Call home health  to follow up on private pay options as well as calling [name] [phone] at [facility]   Continue to follow up [facility] if no return call in about 40 days Continue to review facilities from the list provided call and go visit Keep the booklet ''When a loved one need long-term care" ( this was e-mailed) Continue to explore private pay home care options         SDOH assessments and interventions completed:  No   Care Coordination Interventions:  Yes, provided  Interventions Today    Flowsheet Row Most Recent Value  Chronic Disease   Chronic disease during today's visit Hypertension (HTN), Congestive Heart Failure (CHF), Chronic Kidney Disease/End Stage Renal Disease (ESRD)  General Interventions   General Interventions Discussed/Reviewed General Interventions Reviewed, Level of Care  Level of Care Assisted Living  [will continue to explore options]  Mental Health Interventions   Mental Health Discussed/Reviewed Other  [solution focused, task centered & Problem solving]  Pharmacy Interventions   Pharmacy Dicussed/Reviewed Pharmacy Topics Discussed, Medication Adherence   Medication Adherence Unable to refill medication  [working with provider office]  Safety Interventions   Safety Discussed/Reviewed Safety Reviewed  [currently has [facility] PT, OT and RN and is making progress]  Advanced Directive Interventions   Advanced Directives Discussed/Reviewed Advanced Directives Discussed, Advanced Care Planning  [facility] completed documents with an attorney]       Follow up plan: No further intervention required.   Encounter Outcome:  Pt. Visit Completed   [name], LCSW Social Work Care Coordination  [name] [phone]

## 2022-11-20 DIAGNOSIS — G629 Polyneuropathy, unspecified: Secondary | ICD-10-CM | POA: Diagnosis not present

## 2022-11-20 DIAGNOSIS — J189 Pneumonia, unspecified organism: Secondary | ICD-10-CM | POA: Diagnosis not present

## 2022-11-20 DIAGNOSIS — M1990 Unspecified osteoarthritis, unspecified site: Secondary | ICD-10-CM | POA: Diagnosis not present

## 2022-11-20 DIAGNOSIS — I129 Hypertensive chronic kidney disease with stage 1 through stage 4 chronic kidney disease, or unspecified chronic kidney disease: Secondary | ICD-10-CM | POA: Diagnosis not present

## 2022-11-20 DIAGNOSIS — N1831 Chronic kidney disease, stage 3a: Secondary | ICD-10-CM | POA: Diagnosis not present

## 2022-11-20 DIAGNOSIS — N390 Urinary tract infection, site not specified: Secondary | ICD-10-CM | POA: Diagnosis not present

## 2022-11-23 DIAGNOSIS — G629 Polyneuropathy, unspecified: Secondary | ICD-10-CM | POA: Diagnosis not present

## 2022-11-23 DIAGNOSIS — M1990 Unspecified osteoarthritis, unspecified site: Secondary | ICD-10-CM | POA: Diagnosis not present

## 2022-11-23 DIAGNOSIS — I129 Hypertensive chronic kidney disease with stage 1 through stage 4 chronic kidney disease, or unspecified chronic kidney disease: Secondary | ICD-10-CM | POA: Diagnosis not present

## 2022-11-23 DIAGNOSIS — N1831 Chronic kidney disease, stage 3a: Secondary | ICD-10-CM | POA: Diagnosis not present

## 2022-11-23 DIAGNOSIS — N390 Urinary tract infection, site not specified: Secondary | ICD-10-CM | POA: Diagnosis not present

## 2022-11-23 DIAGNOSIS — J189 Pneumonia, unspecified organism: Secondary | ICD-10-CM | POA: Diagnosis not present

## 2022-11-24 ENCOUNTER — Telehealth: Payer: Medicare Other | Admitting: Pulmonary Disease

## 2022-11-25 DIAGNOSIS — I129 Hypertensive chronic kidney disease with stage 1 through stage 4 chronic kidney disease, or unspecified chronic kidney disease: Secondary | ICD-10-CM | POA: Diagnosis not present

## 2022-11-25 DIAGNOSIS — N390 Urinary tract infection, site not specified: Secondary | ICD-10-CM | POA: Diagnosis not present

## 2022-11-25 DIAGNOSIS — G629 Polyneuropathy, unspecified: Secondary | ICD-10-CM | POA: Diagnosis not present

## 2022-11-25 DIAGNOSIS — N1831 Chronic kidney disease, stage 3a: Secondary | ICD-10-CM | POA: Diagnosis not present

## 2022-11-25 DIAGNOSIS — J189 Pneumonia, unspecified organism: Secondary | ICD-10-CM | POA: Diagnosis not present

## 2022-11-25 DIAGNOSIS — K219 Gastro-esophageal reflux disease without esophagitis: Secondary | ICD-10-CM | POA: Diagnosis not present

## 2022-11-25 DIAGNOSIS — F32A Depression, unspecified: Secondary | ICD-10-CM | POA: Diagnosis not present

## 2022-11-25 DIAGNOSIS — M1990 Unspecified osteoarthritis, unspecified site: Secondary | ICD-10-CM | POA: Diagnosis not present

## 2022-11-29 ENCOUNTER — Ambulatory Visit
Admission: RE | Admit: 2022-11-29 | Discharge: 2022-11-29 | Disposition: A | Discharge status: Home / Self Care | Payer: Medicare Other | Source: Ambulatory Visit | Attending: Pulmonary Disease | Admitting: Pulmonary Disease

## 2022-11-29 DIAGNOSIS — J984 Other disorders of lung: Secondary | ICD-10-CM | POA: Diagnosis not present

## 2022-11-29 DIAGNOSIS — J479 Bronchiectasis, uncomplicated: Secondary | ICD-10-CM | POA: Diagnosis not present

## 2022-11-29 DIAGNOSIS — I2510 Atherosclerotic heart disease of native coronary artery without angina pectoris: Secondary | ICD-10-CM | POA: Diagnosis not present

## 2022-11-29 DIAGNOSIS — J849 Interstitial pulmonary disease, unspecified: Secondary | ICD-10-CM

## 2022-11-30 ENCOUNTER — Encounter: Payer: Self-pay | Admitting: Pulmonary Disease

## 2022-11-30 DIAGNOSIS — N1831 Chronic kidney disease, stage 3a: Secondary | ICD-10-CM | POA: Diagnosis not present

## 2022-11-30 DIAGNOSIS — M1990 Unspecified osteoarthritis, unspecified site: Secondary | ICD-10-CM | POA: Diagnosis not present

## 2022-11-30 DIAGNOSIS — I129 Hypertensive chronic kidney disease with stage 1 through stage 4 chronic kidney disease, or unspecified chronic kidney disease: Secondary | ICD-10-CM | POA: Diagnosis not present

## 2022-11-30 DIAGNOSIS — N390 Urinary tract infection, site not specified: Secondary | ICD-10-CM | POA: Diagnosis not present

## 2022-11-30 DIAGNOSIS — J189 Pneumonia, unspecified organism: Secondary | ICD-10-CM | POA: Diagnosis not present

## 2022-11-30 DIAGNOSIS — G629 Polyneuropathy, unspecified: Secondary | ICD-10-CM | POA: Diagnosis not present

## 2022-12-01 ENCOUNTER — Encounter: Payer: Self-pay | Admitting: Pulmonary Disease

## 2022-12-01 ENCOUNTER — Telehealth (INDEPENDENT_AMBULATORY_CARE_PROVIDER_SITE_OTHER): Payer: Medicare Other | Admitting: Pulmonary Disease

## 2022-12-01 DIAGNOSIS — J849 Interstitial pulmonary disease, unspecified: Secondary | ICD-10-CM | POA: Diagnosis not present

## 2022-12-01 DIAGNOSIS — R0602 Shortness of breath: Secondary | ICD-10-CM | POA: Diagnosis not present

## 2022-12-01 MED ORDER — PREDNISONE 5 MG PO TABS: ORAL_TABLET | ORAL | 0 refills | Status: DC

## 2022-12-01 NOTE — Patient Instructions (Signed)
Start tapering the prednisone.  Reduce to 15 mg for a week and then reduce dose by 5 mg every week until prednisone is done Follow-up in 3 months with video visit

## 2022-12-01 NOTE — Progress Notes (Addendum)
Kathy [name]    [id number]    02/11/1941  Primary Care Physician:John, Len Blalock, MD  Referring Physician: Corwin Levins, MD [address] [place],  [place] [address]  Virtual Visit via Video Note  I connected with Kathy [name] on 12/15/22 at  3:30 PM EDT by a video enabled telemedicine application and verified that I am speaking with the correct person using two identifiers.  Location: Patient: Home Provider: [address]   I discussed the limitations of evaluation and management by telemedicine and the availability of in person appointments. The patient expressed understanding and agreed to proceed.   Chief complaint: Follow up for ILD  HPI: 82 y.o.  with interstitial lung disease, hypertension, hyperlipidemia Followed by Dr. Craige Cotta for ILD, sleep Complains of chronic cough for several years.  States that she has significant dyspnea on exertion.  On 2 L supplemental oxygen She has been treated intermittently with steroids in the past.  Not on any standing therapy.  Developed COVID-19 in January 2021.  She did not require hospitalization Has history of OSA but is not using CPAP on a regular basis.   Pets: She had a cat until 2020, no birds Occupation: Retired Quarry manager Exposures: No known exposure.  No mold, hot tub, Jacuzzi ILD questionnaire 01/30/2020-negative Smoking history: 10-pack-year smoker in her 21s Travel history: No significant travel history Relevant family history: No significant family issue of lung disease  Interim history: Here after a gap of 3 years. She was being evaluated for interstitial lung disease with possible ANA, SSA.  Seen by Dr. Dimple Casey, rheumatology in November of 2021 with no evidence of connective tissue disease.  Has been referred back worsening dyspnea, shortness of breath.  She had an episode of pneumonia in February 2024.  She was hospitalized for community-acquired pneumonia, UTI due to ESBL E. coli, atrial fibrillation  with RVR.  Her health has been declining also with increasing gait fullness and possible dementia.  Recommendation has been made for palliative care.  At last visit we started her on prednisone at 20 mg/day.  She cannot tell if it is helping Continues on supplemental oxygen  Outpatient Encounter Medications as of 12/01/2022  Medication Sig   albuterol (VENTOLIN HFA) 108 (90 Base) MCG/ACT inhaler Inhale 1-2 puffs into the lungs every 6 (six) hours as needed for wheezing or shortness of breath.   amitriptyline (ELAVIL) 50 MG tablet Take 1 tablet (50 mg total) by mouth at bedtime as needed for sleep. And neuropathy   apixaban (ELIQUIS) 5 MG TABS tablet Take 1 tablet (5 mg total) by mouth 2 (two) times daily.   Cholecalciferol (VITAMIN D-3) 25 MCG (1000 UT) CAPS Take 1 capsule by mouth daily.   cyanocobalamin (VITAMIN B12) 1000 MCG/ML injection INJECT 1 ML INTO MUSCLE EVERY 30 DAYS   diltiazem (CARDIZEM SR) 90 MG 12 hr capsule TAKE 1 CAPSULE BY MOUTH EVERY TWELVE HOURS   docusate sodium (COLACE) 100 MG capsule Take 1 capsule (100 mg total) by mouth 2 (two) times daily. (Patient taking differently: Take 100 mg by mouth daily.)   LASIX 20 MG tablet daily. Taking extra 1/2 tab for swelling   pantoprazole (PROTONIX) 40 MG tablet TAKE 1 TABLET BY MOUTH EVERY DAY   predniSONE (DELTASONE) 20 MG tablet Take 1 tablet (20 mg total) by mouth daily with breakfast.   Respiratory Therapy Supplies (FLUTTER) DEVI Use as directed   sotalol (BETAPACE) 80 MG tablet TAKE 1/2  TABLET BY MOUTH EVERY 12 HOURS.   triamcinolone cream (KENALOG) 0.5 % Apply 1 Application topically 2 (two) times daily as needed. (Patient taking differently: Apply 1 Application topically 2 (two) times daily as needed (for itching).)   sertraline (ZOLOFT) 25 MG tablet Take 1 tablet (25 mg total) by mouth daily.   [DISCONTINUED] azithromycin (ZITHROMAX) 250 MG tablet Take as directed   No facility-administered encounter medications on file as of  12/01/2022.   Physical Exam: Televisit  Data Reviewed: Imaging: HRCT 02/22/2017-patchy air trapping with reticulation, groundglass with traction bronchiectasis.  High-resolution CT scan 01/11/2018-prominent patchy airspace disease with out trapping, subpleural reticulation, groundglass with no basal gradient Findings suggestive of chronic HP with mild worsening compared to 2018.  Dilated pulmonary artery  High-resolution CT 02/08/2020-findings suggestive of chronic HP, minimally progressed compared to 2019.  CT chest 08/14/2022-worsening interstitial lung disease small consolidation in the right upper lobe.  High resolution CT 11/29/2022-prelim read by me shows mild increase in findings with alternate diagnosis, suggestive of chronic HP I have reviewed the images personally.  PFTs: 03/14/2018 FVC 1.35 [60%], FEV1 1.27 [75%], F/F 94, DLCO 11.23 [59%] Mild restriction on spirometry, mild to moderate diffusion defect.  Worsened since 2018  04/04/2020 FVC 1.33 [61%], FEV1 1.28 [79%], F/F96, TLC 2.49 [55%], DLCO 8.68 [52%] Severe restriction, moderate-severe diffusion defect.  Labs: CTD serologies 03/01/2017-negative ANA, CCP, rule,, RA, SSA, SSB Hypersensitivity panel 03/15/2017 and 07/20/17-negative  Repeat CTD serologies ANA 1;320, positive SSA Hypersensitivity panel 01/30/2020-negative  Repeat CTD serologies 10/26/2022 ANA 1:160, cytoplasmic  IgE 10/26/2022- 42 CBC 10/26/2022-WBC 8.3, eos 1.4%, absolute eosinophil count 116  N-terminal proBNP 02/13/2019-303  Cardiac: Echocardiogram 02/18/2020 LVEF 45-50% with global hypokinesis, mildly elevated pulmonary hypertension with RVSP 36  Assessment:  Interstitial lung disease CT has appearance of chronic HP with prominent air trapping although extensive history taking and ILD questionnaire does not reveal any significant exposures.  Positive ANA and SSA may indicate an autoimmune process. She also had COVID-19 in January 2021 which may have  muddied the water and cause progression of baseline ILD.  Now returns to clinic after 2021 with worsening dyspnea.  For CT shows increased interstitial opacities and I suspect that she may have worsening interstitial lung disease with flare. Currently on prednisone 20 mg a day and cannot tell if it is helping.  Will start a slow taper by 10 mg every week Continue to monitor blood sugar  Repeat CTD serologies continues to show mild elevation in ANA which is likely nonspecific Continue supplemental oxygen.  Order humidifier  Cardiomyopathy with pulmonary hypertension Mild atrial fibrillation with RVR Follow-up with cardiology  Dementia Family tells me that she is becoming increasingly forgetful.  Ensure that she has people with her all the time to make sure she is safe.  Plan/Recommendations: Prednisone taper over 1 month Supplemental oxygen with humidifier  I discussed the assessment and treatment plan with the patient. The patient was provided an opportunity to ask questions and all were answered. The patient agreed with the plan and demonstrated an understanding of the instructions.   The patient was advised to call back or seek an in-person evaluation if the symptoms worsen or if the condition fails to improve as anticipated.  Chilton Greathouse MD [facility] 12/01/2022, 3:35 PM  CC: Corwin Levins, MD

## 2022-12-02 DIAGNOSIS — I129 Hypertensive chronic kidney disease with stage 1 through stage 4 chronic kidney disease, or unspecified chronic kidney disease: Secondary | ICD-10-CM | POA: Diagnosis not present

## 2022-12-02 DIAGNOSIS — N390 Urinary tract infection, site not specified: Secondary | ICD-10-CM | POA: Diagnosis not present

## 2022-12-02 DIAGNOSIS — M1990 Unspecified osteoarthritis, unspecified site: Secondary | ICD-10-CM | POA: Diagnosis not present

## 2022-12-02 DIAGNOSIS — N1831 Chronic kidney disease, stage 3a: Secondary | ICD-10-CM | POA: Diagnosis not present

## 2022-12-02 DIAGNOSIS — J189 Pneumonia, unspecified organism: Secondary | ICD-10-CM | POA: Diagnosis not present

## 2022-12-02 DIAGNOSIS — G629 Polyneuropathy, unspecified: Secondary | ICD-10-CM | POA: Diagnosis not present

## 2022-12-03 ENCOUNTER — Telehealth: Payer: Self-pay | Admitting: Internal Medicine

## 2022-12-03 ENCOUNTER — Ambulatory Visit: Payer: Self-pay

## 2022-12-03 NOTE — Telephone Encounter (Signed)
Ok for verbals 

## 2022-12-03 NOTE — Patient Instructions (Signed)
Visit Information  Thank you for taking time to visit with me today. Please don't hesitate to contact me if I can be of assistance to you.   Following are the goals we discussed today:  Continue to take medications as prescribed Continue to attend provider visits as scheduled/recommended Contact number for [facility] [phone] Continue home safety precautions Continue to participate with home health agency as recommended Contact RN Care Coordinator if care coordination needs in the future    If you are experiencing a Mental Health or Behavioral Health Crisis or need someone to talk to, please call the Suicide and Crisis Lifeline: 56   Kathyrn Sheriff, RN, MSN, BSN, CCM [facility] Care Coordinator [phone]

## 2022-12-03 NOTE — Patient Outreach (Signed)
  Care Coordination   Follow Up Visit Note   12/03/2022 Name: Biannca Scantlin MRN: [id number] DOB: Feb 09, 1941  Camillia Marcy is a 82 y.o. year old female who sees Corwin Levins, MD for primary care. I spoke with daughter in law, Arts administrator)  by phone today.  What matters to the patients health and wellness today?  Marylu Lund reports patient's situation has stabilized. She reports awaiting start of care date for [facility](in home primary care provider group), as it is getting more difficult to get patient out of the house to her appointments. She denies any questions or care coordination, disease management or resource needs at this time. Marylu Lund to contact RNCM if care coordination needs in the future.  Goals Addressed             This Visit's Progress    COMPLETED: Care Coordination activities-transition post acute rehab       Interventions Today    Flowsheet Row Most Recent Value  Chronic Disease   Chronic disease during today's visit Other  [interstitial lung disease, cognitive decline]  General Interventions   General Interventions Discussed/Reviewed General Interventions Reviewed, [facility], Doctor Visits  [Encouraged Marylu Lund to contact RNCM if any care coordination needs in the future. Confirmed she has RNCM contact number. Confirmed that Marylu Lund has [facility]'s correct contact number [phone]  Doctor Visits Discussed/Reviewed Doctor Visits Reviewed, Doctor Visits Discussed, Specialist  PCP/Specialist Visits Compliance with follow-up visit  [reviewed upcoming scheduled appointments]  Exercise Interventions   Exercise Discussed/Reviewed Exercise Reviewed  Education Interventions   Education Provided Provided Education  Provided Verbal Education On Other  [reviewed patient instructions per pulmonology visit on 12/01/22]  Pharmacy Interventions   Pharmacy Dicussed/Reviewed Pharmacy Topics Reviewed  Gregary Signs is still managing medications-patient taking as  prescribed]  Safety Interventions   Safety Discussed/Reviewed Safety Reviewed  Latina Craver active with home health PT/Nursing]            SDOH assessments and interventions completed:  No  Care Coordination Interventions:  Yes, provided   Follow up plan: No further intervention required.   Encounter Outcome:  Pt. Visit Completed   Kathyrn Sheriff, RN, MSN, BSN, CCM [facility] Care Coordinator [phone]

## 2022-12-03 NOTE — Telephone Encounter (Signed)
Caller & What Company:  Loraine Leriche - [facility]   Phone Number:  [phone]   Needs Verbal orders for what service & frequency:  home health physical therapy 2 times a week for 4 weeks, then 1 time a week for 4 weeks, starting 12/05/2022

## 2022-12-04 DIAGNOSIS — K5900 Constipation, unspecified: Secondary | ICD-10-CM | POA: Diagnosis not present

## 2022-12-04 DIAGNOSIS — I129 Hypertensive chronic kidney disease with stage 1 through stage 4 chronic kidney disease, or unspecified chronic kidney disease: Secondary | ICD-10-CM | POA: Diagnosis not present

## 2022-12-04 DIAGNOSIS — M1990 Unspecified osteoarthritis, unspecified site: Secondary | ICD-10-CM | POA: Diagnosis not present

## 2022-12-04 DIAGNOSIS — F32A Depression, unspecified: Secondary | ICD-10-CM | POA: Diagnosis not present

## 2022-12-04 DIAGNOSIS — Z7901 Long term (current) use of anticoagulants: Secondary | ICD-10-CM | POA: Diagnosis not present

## 2022-12-04 DIAGNOSIS — K2100 Gastro-esophageal reflux disease with esophagitis, without bleeding: Secondary | ICD-10-CM | POA: Diagnosis not present

## 2022-12-04 DIAGNOSIS — Z87891 Personal history of nicotine dependence: Secondary | ICD-10-CM | POA: Diagnosis not present

## 2022-12-04 DIAGNOSIS — G4730 Sleep apnea, unspecified: Secondary | ICD-10-CM | POA: Diagnosis not present

## 2022-12-04 DIAGNOSIS — G629 Polyneuropathy, unspecified: Secondary | ICD-10-CM | POA: Diagnosis not present

## 2022-12-04 DIAGNOSIS — Z9981 Dependence on supplemental oxygen: Secondary | ICD-10-CM | POA: Diagnosis not present

## 2022-12-04 DIAGNOSIS — E7800 Pure hypercholesterolemia, unspecified: Secondary | ICD-10-CM | POA: Diagnosis not present

## 2022-12-04 DIAGNOSIS — N1831 Chronic kidney disease, stage 3a: Secondary | ICD-10-CM | POA: Diagnosis not present

## 2022-12-04 DIAGNOSIS — R32 Unspecified urinary incontinence: Secondary | ICD-10-CM | POA: Diagnosis not present

## 2022-12-04 DIAGNOSIS — G9341 Metabolic encephalopathy: Secondary | ICD-10-CM | POA: Diagnosis not present

## 2022-12-04 DIAGNOSIS — J849 Interstitial pulmonary disease, unspecified: Secondary | ICD-10-CM | POA: Diagnosis not present

## 2022-12-04 DIAGNOSIS — E785 Hyperlipidemia, unspecified: Secondary | ICD-10-CM | POA: Diagnosis not present

## 2022-12-04 DIAGNOSIS — J8410 Pulmonary fibrosis, unspecified: Secondary | ICD-10-CM | POA: Diagnosis not present

## 2022-12-09 NOTE — Telephone Encounter (Signed)
LVM for [facility] [place] Verbal orders

## 2022-12-10 DIAGNOSIS — I129 Hypertensive chronic kidney disease with stage 1 through stage 4 chronic kidney disease, or unspecified chronic kidney disease: Secondary | ICD-10-CM | POA: Diagnosis not present

## 2022-12-10 DIAGNOSIS — N1831 Chronic kidney disease, stage 3a: Secondary | ICD-10-CM | POA: Diagnosis not present

## 2022-12-10 DIAGNOSIS — M1990 Unspecified osteoarthritis, unspecified site: Secondary | ICD-10-CM | POA: Diagnosis not present

## 2022-12-10 DIAGNOSIS — J849 Interstitial pulmonary disease, unspecified: Secondary | ICD-10-CM | POA: Diagnosis not present

## 2022-12-10 DIAGNOSIS — G629 Polyneuropathy, unspecified: Secondary | ICD-10-CM | POA: Diagnosis not present

## 2022-12-10 DIAGNOSIS — G9341 Metabolic encephalopathy: Secondary | ICD-10-CM | POA: Diagnosis not present

## 2022-12-15 DIAGNOSIS — G9341 Metabolic encephalopathy: Secondary | ICD-10-CM | POA: Diagnosis not present

## 2022-12-15 DIAGNOSIS — G629 Polyneuropathy, unspecified: Secondary | ICD-10-CM | POA: Diagnosis not present

## 2022-12-15 DIAGNOSIS — J849 Interstitial pulmonary disease, unspecified: Secondary | ICD-10-CM | POA: Diagnosis not present

## 2022-12-15 DIAGNOSIS — M1990 Unspecified osteoarthritis, unspecified site: Secondary | ICD-10-CM | POA: Diagnosis not present

## 2022-12-15 DIAGNOSIS — I129 Hypertensive chronic kidney disease with stage 1 through stage 4 chronic kidney disease, or unspecified chronic kidney disease: Secondary | ICD-10-CM | POA: Diagnosis not present

## 2022-12-15 DIAGNOSIS — N1831 Chronic kidney disease, stage 3a: Secondary | ICD-10-CM | POA: Diagnosis not present

## 2022-12-16 ENCOUNTER — Ambulatory Visit: Payer: Medicare Other | Admitting: Cardiology

## 2022-12-16 ENCOUNTER — Other Ambulatory Visit: Payer: Self-pay

## 2022-12-16 ENCOUNTER — Other Ambulatory Visit: Payer: Self-pay | Admitting: Internal Medicine

## 2022-12-16 DIAGNOSIS — G629 Polyneuropathy, unspecified: Secondary | ICD-10-CM | POA: Diagnosis not present

## 2022-12-16 DIAGNOSIS — J849 Interstitial pulmonary disease, unspecified: Secondary | ICD-10-CM | POA: Diagnosis not present

## 2022-12-16 DIAGNOSIS — N1831 Chronic kidney disease, stage 3a: Secondary | ICD-10-CM | POA: Diagnosis not present

## 2022-12-16 DIAGNOSIS — I129 Hypertensive chronic kidney disease with stage 1 through stage 4 chronic kidney disease, or unspecified chronic kidney disease: Secondary | ICD-10-CM | POA: Diagnosis not present

## 2022-12-16 DIAGNOSIS — G9341 Metabolic encephalopathy: Secondary | ICD-10-CM | POA: Diagnosis not present

## 2022-12-16 DIAGNOSIS — M1990 Unspecified osteoarthritis, unspecified site: Secondary | ICD-10-CM | POA: Diagnosis not present

## 2022-12-17 ENCOUNTER — Telehealth: Payer: Self-pay | Admitting: Internal Medicine

## 2022-12-17 ENCOUNTER — Telehealth: Payer: Self-pay

## 2022-12-17 DIAGNOSIS — I129 Hypertensive chronic kidney disease with stage 1 through stage 4 chronic kidney disease, or unspecified chronic kidney disease: Secondary | ICD-10-CM | POA: Diagnosis not present

## 2022-12-17 DIAGNOSIS — N1831 Chronic kidney disease, stage 3a: Secondary | ICD-10-CM | POA: Diagnosis not present

## 2022-12-17 DIAGNOSIS — M1990 Unspecified osteoarthritis, unspecified site: Secondary | ICD-10-CM | POA: Diagnosis not present

## 2022-12-17 DIAGNOSIS — G9341 Metabolic encephalopathy: Secondary | ICD-10-CM | POA: Diagnosis not present

## 2022-12-17 DIAGNOSIS — G629 Polyneuropathy, unspecified: Secondary | ICD-10-CM | POA: Diagnosis not present

## 2022-12-17 DIAGNOSIS — J849 Interstitial pulmonary disease, unspecified: Secondary | ICD-10-CM | POA: Diagnosis not present

## 2022-12-17 NOTE — Patient Instructions (Signed)
Visit Information  Thank you for taking time to visit with me today. Please don't hesitate to contact me if I can be of assistance to you.   Following are the goals we discussed today:  Continue to follow up with provider visits as scheduled/recommended Continue to contact PCP with health questions or concerns Please call RN Care Coordinator [phone] if any care coordination needs before our next call.   Our next appointment is: RN Care Coordinator will follow up with you to update you once return call received from [facility] next week.  Please call the care guide team at [phone] if you need to cancel or reschedule your appointment.   If you are experiencing a Mental Health or Behavioral Health Crisis or need someone to talk to, please call the Suicide and Crisis Lifeline: 36  Kathyrn Sheriff, RN, MSN, BSN, CCM [facility] Care Coordinator [phone]
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
## 2022-12-17 NOTE — Telephone Encounter (Signed)
Ok noted  

## 2022-12-17 NOTE — Patient Outreach (Signed)
  Care Coordination   Care Coordination  Visit Note   12/17/2022 Name: Kathy [name] MRN: [id number] DOB: 29-Apr-1941  Kathy [name] is a 82 y.o. year old female who sees Kathy Levins, MD for primary care. I spoke with  Kathy [name] by phone today.  What matters to [name] and wellness today?  Returned call to daughter in Nurse, mental [name], Kathy [name]. Kathy [name] reports she has not heard from [employer]. Kathy [name] also reports she has scheduled a new patient appointment for a physician practice closer patient's home in [place] scheduled for August. In the meantime patient has an appointment scheduled with current PCP in July.   Goals Addressed             This Visit's Progress    Care Coordination Activities       Interventions Today    Flowsheet Row Most Recent Value  General Interventions   General Interventions Discussed/Reviewed Community Resources  Kathy [name] contacted Kathy [name], request return call from Referral Coordinator.]  Doctor Visits Discussed/Reviewed Doctor Visits Reviewed  PCP/Specialist Visits Compliance with follow-up visit            SDOH assessments and interventions completed:  No  Care Coordination Interventions:  Yes, provided   Follow up plan:  RNCM is awaiting follow up call from Kathy [name] and will reach out to Kathy [name] at that time to update.    Encounter Outcome:  Pt. Visit Completed   Kathy Sheriff, RN, MSN, BSN, CCM [facility] Care Coordinator [phone]

## 2022-12-20 DIAGNOSIS — N1831 Chronic kidney disease, stage 3a: Secondary | ICD-10-CM | POA: Diagnosis not present

## 2022-12-20 DIAGNOSIS — G629 Polyneuropathy, unspecified: Secondary | ICD-10-CM | POA: Diagnosis not present

## 2022-12-20 DIAGNOSIS — J849 Interstitial pulmonary disease, unspecified: Secondary | ICD-10-CM | POA: Diagnosis not present

## 2022-12-20 DIAGNOSIS — I129 Hypertensive chronic kidney disease with stage 1 through stage 4 chronic kidney disease, or unspecified chronic kidney disease: Secondary | ICD-10-CM | POA: Diagnosis not present

## 2022-12-20 DIAGNOSIS — G9341 Metabolic encephalopathy: Secondary | ICD-10-CM | POA: Diagnosis not present

## 2022-12-20 DIAGNOSIS — M1990 Unspecified osteoarthritis, unspecified site: Secondary | ICD-10-CM | POA: Diagnosis not present

## 2022-12-23 DIAGNOSIS — G9341 Metabolic encephalopathy: Secondary | ICD-10-CM | POA: Diagnosis not present

## 2022-12-23 DIAGNOSIS — G629 Polyneuropathy, unspecified: Secondary | ICD-10-CM | POA: Diagnosis not present

## 2022-12-23 DIAGNOSIS — N1831 Chronic kidney disease, stage 3a: Secondary | ICD-10-CM | POA: Diagnosis not present

## 2022-12-23 DIAGNOSIS — J849 Interstitial pulmonary disease, unspecified: Secondary | ICD-10-CM | POA: Diagnosis not present

## 2022-12-23 DIAGNOSIS — I129 Hypertensive chronic kidney disease with stage 1 through stage 4 chronic kidney disease, or unspecified chronic kidney disease: Secondary | ICD-10-CM | POA: Diagnosis not present

## 2022-12-23 DIAGNOSIS — M1990 Unspecified osteoarthritis, unspecified site: Secondary | ICD-10-CM | POA: Diagnosis not present

## 2022-12-27 DIAGNOSIS — J849 Interstitial pulmonary disease, unspecified: Secondary | ICD-10-CM | POA: Diagnosis not present

## 2022-12-27 DIAGNOSIS — N1831 Chronic kidney disease, stage 3a: Secondary | ICD-10-CM | POA: Diagnosis not present

## 2022-12-27 DIAGNOSIS — G629 Polyneuropathy, unspecified: Secondary | ICD-10-CM | POA: Diagnosis not present

## 2022-12-27 DIAGNOSIS — G9341 Metabolic encephalopathy: Secondary | ICD-10-CM | POA: Diagnosis not present

## 2022-12-27 DIAGNOSIS — I129 Hypertensive chronic kidney disease with stage 1 through stage 4 chronic kidney disease, or unspecified chronic kidney disease: Secondary | ICD-10-CM | POA: Diagnosis not present

## 2022-12-27 DIAGNOSIS — M1990 Unspecified osteoarthritis, unspecified site: Secondary | ICD-10-CM | POA: Diagnosis not present

## 2022-12-28 ENCOUNTER — Encounter: Payer: Self-pay | Admitting: Internal Medicine

## 2022-12-28 ENCOUNTER — Ambulatory Visit (INDEPENDENT_AMBULATORY_CARE_PROVIDER_SITE_OTHER): Payer: Medicare Other

## 2022-12-28 ENCOUNTER — Ambulatory Visit (INDEPENDENT_AMBULATORY_CARE_PROVIDER_SITE_OTHER): Payer: Medicare Other | Admitting: Internal Medicine

## 2022-12-28 VITALS — BP 136/78 | HR 67 | Temp 98.7°F | Ht 60.0 in | Wt 212.0 lb

## 2022-12-28 DIAGNOSIS — R410 Disorientation, unspecified: Secondary | ICD-10-CM

## 2022-12-28 DIAGNOSIS — R739 Hyperglycemia, unspecified: Secondary | ICD-10-CM

## 2022-12-28 DIAGNOSIS — I10 Essential (primary) hypertension: Secondary | ICD-10-CM

## 2022-12-28 DIAGNOSIS — R918 Other nonspecific abnormal finding of lung field: Secondary | ICD-10-CM | POA: Diagnosis not present

## 2022-12-28 DIAGNOSIS — J984 Other disorders of lung: Secondary | ICD-10-CM | POA: Diagnosis not present

## 2022-12-28 DIAGNOSIS — E559 Vitamin D deficiency, unspecified: Secondary | ICD-10-CM | POA: Diagnosis not present

## 2022-12-28 DIAGNOSIS — J849 Interstitial pulmonary disease, unspecified: Secondary | ICD-10-CM | POA: Diagnosis not present

## 2022-12-28 DIAGNOSIS — E538 Deficiency of other specified B group vitamins: Secondary | ICD-10-CM

## 2022-12-28 DIAGNOSIS — R051 Acute cough: Secondary | ICD-10-CM | POA: Diagnosis not present

## 2022-12-28 DIAGNOSIS — H6123 Impacted cerumen, bilateral: Secondary | ICD-10-CM | POA: Diagnosis not present

## 2022-12-28 DIAGNOSIS — R059 Cough, unspecified: Secondary | ICD-10-CM | POA: Diagnosis not present

## 2022-12-28 DIAGNOSIS — R9389 Abnormal findings on diagnostic imaging of other specified body structures: Secondary | ICD-10-CM | POA: Diagnosis not present

## 2022-12-28 DIAGNOSIS — N1831 Chronic kidney disease, stage 3a: Secondary | ICD-10-CM

## 2022-12-28 LAB — CBC WITH DIFFERENTIAL/PLATELET
Basophils Absolute: 0 10*3/uL (ref 0.0–0.1)
Basophils Relative: 0.4 % (ref 0.0–3.0)
Eosinophils Absolute: 0 10*3/uL (ref 0.0–0.7)
Eosinophils Relative: 0.3 % (ref 0.0–5.0)
HCT: 37.2 % (ref 36.0–46.0)
Hemoglobin: 12.3 g/dL (ref 12.0–15.0)
Lymphocytes Relative: 22.1 % (ref 12.0–46.0)
Lymphs Abs: 1.7 10*3/uL (ref 0.7–4.0)
MCHC: 32.9 g/dL (ref 30.0–36.0)
MCV: 95.8 fl (ref 78.0–100.0)
Monocytes Absolute: 0.3 10*3/uL (ref 0.1–1.0)
Monocytes Relative: 4 % (ref 3.0–12.0)
Neutro Abs: 5.7 10*3/uL (ref 1.4–7.7)
Neutrophils Relative %: 73.2 % (ref 43.0–77.0)
Platelets: 168 10*3/uL (ref 150.0–400.0)
RBC: 3.89 Mil/uL (ref 3.87–5.11)
RDW: 13.6 % (ref 11.5–15.5)
WBC: 7.8 10*3/uL (ref 4.0–10.5)

## 2022-12-28 LAB — MICROALBUMIN / CREATININE URINE RATIO
Creatinine,U: 47.3 mg/dL
Microalb Creat Ratio: 1.5 mg/g (ref 0.0–30.0)
Microalb, Ur: <0.7 mg/dL (ref 0.0–1.9)

## 2022-12-28 LAB — HEPATIC FUNCTION PANEL
ALT: 16 U/L (ref 0–35)
AST: 16 U/L (ref 0–37)
Albumin: 3.6 g/dL (ref 3.5–5.2)
Alkaline Phosphatase: 60 U/L (ref 39–117)
Bilirubin, Direct: 0.1 mg/dL (ref 0.0–0.3)
Total Bilirubin: 0.8 mg/dL (ref 0.2–1.2)
Total Protein: 6.9 g/dL (ref 6.0–8.3)

## 2022-12-28 LAB — LIPID PANEL
Cholesterol: 205 mg/dL — ABNORMAL HIGH (ref 0–200)
HDL: 66.2 mg/dL (ref >39.00)
LDL Cholesterol: 118 mg/dL — ABNORMAL HIGH (ref 0–99)
NonHDL: 138.36
Total CHOL/HDL Ratio: 3
Triglycerides: 100 mg/dL (ref 0.0–149.0)
VLDL: 20 mg/dL (ref 0.0–40.0)

## 2022-12-28 LAB — BASIC METABOLIC PANEL
BUN: 21 mg/dL (ref 6–23)
CO2: 36 mEq/L — ABNORMAL HIGH (ref 19–32)
Calcium: 9.7 mg/dL (ref 8.4–10.5)
Chloride: 93 mEq/L — ABNORMAL LOW (ref 96–112)
Creatinine, Ser: 0.93 mg/dL (ref 0.40–1.20)
GFR: 57.43 mL/min — ABNORMAL LOW (ref >60.00)
Glucose, Bld: 165 mg/dL — ABNORMAL HIGH (ref 70–99)
Potassium: 4.4 mEq/L (ref 3.5–5.1)
Sodium: 135 mEq/L (ref 135–145)

## 2022-12-28 LAB — TSH: TSH: 1.57 u[IU]/mL (ref 0.35–5.50)

## 2022-12-28 LAB — VITAMIN B12: Vitamin B-12: 355 pg/mL (ref 211–911)

## 2022-12-28 LAB — VITAMIN D 25 HYDROXY (VIT D DEFICIENCY, FRACTURES): VITD: 38.93 ng/mL (ref 30.00–100.00)

## 2022-12-28 MED ORDER — METHYLPREDNISOLONE ACETATE 80 MG/ML IJ SUSP
80.0000 mg | Freq: Once | INTRAMUSCULAR | Status: AC
Start: 2022-12-28 — End: 2022-12-28
  Administered 2022-12-28: 80 mg via INTRAMUSCULAR

## 2022-12-28 MED ORDER — AZITHROMYCIN 250 MG PO TABS: ORAL_TABLET | ORAL | 1 refills | Status: AC

## 2022-12-28 NOTE — Patient Instructions (Addendum)
Your ears were irrigated today of wax  You had the steroid shot today  Please take all new medication as prescribed - the antibioticx for any fever or worsening cough  Please have your Shingrix (shingles) shots done at your local pharmacy.  Please continue all other medications as before, and refills have been done if requested.  Please have the pharmacy call with any other refills you may need.  Please continue your efforts at being more active, low cholesterol diet, and weight control.  Please keep your appointments with your specialists as you may have planned - Neurology soon  Please go to the XRAY Department in the first floor for the x-ray testing  Please go to the LAB at the blood drawing area for the tests to be done  You will be contacted by phone if any changes need to be made immediately.  Otherwise, you will receive a letter about your results with an explanation, but please check with MyChart first.  Please remember to sign up for MyChart if you have not done so, as this will be important to you in the future with finding out test results, communicating by private email, and scheduling acute appointments online when needed.  Please make an Appointment to return in 6 months, or sooner if needed

## 2022-12-28 NOTE — Progress Notes (Unsigned)
Patient ID: Kathy [name], female   DOB: [birth year], 82 y.o.   MRN: [id number]        Chief Complaint: follow up ILD with wheezing, congestion cough, worsening memory changes with recent hallucinations, bilateral hearing reduced with wax again, low b21 [id number]       HPI:  Kathy [name] is a 82 y.o. female here with daughter who gives hx, pt finished prednisone x 3 days, already starting to resume wheezing congestion, cough and she is asking for depomedrol today, to make it then back to pulm as scheduled.  Also unfortuantely has been developing recent worsening memory changes loss, and already has neuro f/u appt for July 18, wants to further address at that time.  Has had recent halllucinations as well, with adequate o2 sats at home per daughter.  Does also have less hearing recently, has had wax impactions in past, Pt denies chest pain, increased orthopnea, PND, increased LE swelling, palpitations, dizziness.         Wt Readings from Last 3 Encounters:  12/28/22 212 lb (96.2 kg)  10/26/22 212 lb (96.2 kg)  10/14/22 210 lb (95.3 kg)   BP Readings from Last 3 Encounters:  12/28/22 136/78  10/26/22 120/66  10/14/22 (!) 125/53         Past Medical History:  Diagnosis Date   Arthritis    fingers   CKD (chronic kidney disease) stage 3, GFR 30-59 ml/min (HCC) 11/30/2017   Depression    Dizziness    in AM, getting out of bed   Dysrhythmia    A fib   Endometrial ca (HCC) 11/30/2017   S/p surgury 1990's   Fall    GERD (gastroesophageal reflux disease) 11/30/2017   HLD (hyperlipidemia) 11/30/2017   Hypercholesteremia    Hypertension    Interstitial lung disease (HCC)    Neuropathy    bilateral feet   Pneumonia 12/14/2021   Shortness of breath dyspnea    Sleep apnea    has CPAP, doesn't use   Umbilical hernia    Past Surgical History:  Procedure Laterality Date   ABDOMINAL HYSTERECTOMY     BROW LIFT Bilateral 07/15/2015   Procedure: BLEPHAROPLASTY;  Surgeon: Imagene Riches, MD;  Location:  [facility];  Service: Ophthalmology;  Laterality: Bilateral;   BUBBLE STUDY  08/18/2022   Procedure: BUBBLE STUDY;  Surgeon: Tessa Lerner, DO;  Location: [facility];  Service: Cardiovascular;;   CARDIOVERSION N/A 08/18/2022   Procedure: CARDIOVERSION;  Surgeon: Tessa Lerner, DO;  Location: [facility];  Service: Cardiovascular;  Laterality: N/A;   CATARACT EXTRACTION W/PHACO Right 12/31/2021   Procedure: CATARACT EXTRACTION PHACO AND INTRAOCULAR LENS PLACEMENT (IOC) RIGHT;  Surgeon: Estanislado Pandy, MD;  Location: [facility];  Service: Ophthalmology;  Laterality: Right;  17.11 1:46.1   CHOLECYSTECTOMY     HAMMER TOE SURGERY     HERNIA REPAIR     KNEE ARTHROSCOPY Bilateral    PTOSIS REPAIR Bilateral 07/15/2015   Procedure: PTOSIS REPAIR;  Surgeon: Imagene Riches, MD;  Location: [facility];  Service: Ophthalmology;  Laterality: Bilateral;  CPAP   TEE WITHOUT CARDIOVERSION N/A 08/18/2022   Procedure: TRANSESOPHAGEAL ECHOCARDIOGRAM (TEE);  Surgeon: Tessa Lerner, DO;  Location: [facility];  Service: Cardiovascular;  Laterality: N/A;   TONSILLECTOMY      reports that she quit smoking about 36 years ago. Her smoking use included cigarettes. She has a 10.00 pack-year smoking history. She has never used smokeless tobacco. She reports that she does not  drink alcohol and does not use drugs. family history includes Congestive Heart Failure in her mother; Diabetes in her son; Parkinson's disease in her father; Stroke in her father. Allergies  Allergen Reactions   Lipitor [Atorvastatin] Other (See Comments)    Memory issues   Requip [Ropinirole Hcl] Other (See Comments)    Pt reports feeling generally unwell on this medication   Current Outpatient Medications on File Prior to Visit  Medication Sig Dispense Refill   albuterol (VENTOLIN HFA) 108 (90 Base) MCG/ACT inhaler Inhale 1-2 puffs into the lungs every 6 (six) hours as needed for wheezing or shortness of breath. 1  each 2   amitriptyline (ELAVIL) 50 MG tablet Take 1 tablet (50 mg total) by mouth at bedtime as needed for sleep. And neuropathy     apixaban (ELIQUIS) 5 MG TABS tablet Take 1 tablet (5 mg total) by mouth 2 (two) times daily. 180 tablet 3   Cholecalciferol (VITAMIN D-3) 25 MCG (1000 UT) CAPS Take 1 capsule by mouth daily.     cyanocobalamin (VITAMIN B12) 1000 MCG/ML injection INJECT 1 ML INTO MUSCLE EVERY 30 DAYS 3 mL 1   diltiazem (CARDIZEM SR) 90 MG 12 hr capsule TAKE 1 CAPSULE BY MOUTH EVERY TWELVE HOURS 60 capsule 2   docusate sodium (COLACE) 100 MG capsule Take 1 capsule (100 mg total) by mouth 2 (two) times daily. (Patient taking differently: Take 100 mg by mouth daily.) 30 capsule 2   LASIX 20 MG tablet daily. Taking extra 1/2 tab for swelling     pantoprazole (PROTONIX) 40 MG tablet TAKE 1 TABLET BY MOUTH EVERY DAY 90 tablet 3   predniSONE (DELTASONE) 20 MG tablet Take 1 tablet (20 mg total) by mouth daily with breakfast. 30 tablet 1   predniSONE (DELTASONE) 5 MG tablet Reduce to 15mg  daily x's 1 week, 10mg  x's 1 week, 5mg  x's 1 week, and stop 42 tablet 0   Respiratory Therapy Supplies (FLUTTER) DEVI Use as directed 1 each 0   sotalol (BETAPACE) 80 MG tablet TAKE 1/2 TABLET BY MOUTH EVERY 12 HOURS. 30 tablet 0   triamcinolone cream (KENALOG) 0.5 % Apply 1 Application topically 2 (two) times daily as needed. (Patient taking differently: Apply 1 Application topically 2 (two) times daily as needed (for itching).) 454 g 1   sertraline (ZOLOFT) 25 MG tablet Take 1 tablet (25 mg total) by mouth daily. 30 tablet 2   No current facility-administered medications on file prior to visit.        ROS:  All others reviewed and negative.  Objective        PE:  BP 136/78 (BP Location: Right Arm, Patient Position: Sitting, Cuff Size: Normal)   Pulse 67   Temp 98.7 F (37.1 C) (Oral)   Ht 5' (1.524 m)   Wt 212 lb (96.2 kg)   SpO2 98%   BMI 41.40 kg/m                 Constitutional: Pt appears  in NAD               HENT: Head: NCAT.                Right Ear: External ear normal.                 Left Ear: External ear normal.                Eyes: . Pupils are equal, round, and reactive to  light. Conjunctivae and EOM are normal               Nose: without d/c or deformity               Neck: Neck supple. Gross normal ROM               Cardiovascular: Normal rate and regular rhythm.                 Pulmonary/Chest: Effort normal and breath sounds without rales or wheezing.                Abd:  Soft, NT, ND, + BS, no organomegaly               Neurological: Pt is alert. At baseline orientation, motor grossly intact               Skin: Skin is warm. No rashes, no other new lesions, LE edema - none               Psychiatric: Pt behavior is normal without agitation   Micro: none  Cardiac tracings I have personally interpreted today:  none  Pertinent Radiological findings (summarize): none   Lab Results  Component Value Date   WBC 7.8 12/28/2022   HGB 12.3 12/28/2022   HCT 37.2 12/28/2022   PLT 168.0 12/28/2022   GLUCOSE 165 (H) 12/28/2022   CHOL 205 (H) 12/28/2022   TRIG 100.0 12/28/2022   HDL 66.20 12/28/2022   LDLCALC 118 (H) 12/28/2022   ALT 16 12/28/2022   AST 16 12/28/2022   NA 135 12/28/2022   K 4.4 12/28/2022   CL 93 (L) 12/28/2022   CREATININE 0.93 12/28/2022   BUN 21 12/28/2022   CO2 36 (H) 12/28/2022   TSH 1.57 12/28/2022   HGBA1C 7.6 (H) 12/28/2022   MICROALBUR <0.7 12/28/2022   Assessment/Plan:  Simrat Grgas is a 82 y.o. White or Caucasian [1] female with  has a past medical history of Arthritis, CKD (chronic kidney disease) stage 3, GFR 30-59 ml/min (HCC) (11/30/2017), Depression, Dizziness, Dysrhythmia, Endometrial ca (HCC) (11/30/2017), Fall, GERD (gastroesophageal reflux disease) (11/30/2017), HLD (hyperlipidemia) (11/30/2017), Hypercholesteremia, Hypertension, Interstitial lung disease (HCC), Neuropathy, Pneumonia (12/14/2021), Shortness of breath  dyspnea, Sleep apnea, and Umbilical hernia.  ILD (interstitial lung disease) (HCC) With worsening symptoms is appears, ok for depomedrol 80 mg IM today, f/u pulm as planned  B12 deficiency Lab Results  Component Value Date   VITAMINB12 355 12/28/2022   Stable, cont oral replacement - b12 1000 mcg qd   CKD (chronic kidney disease) stage 3, GFR 30-59 ml/min (HCC) Lab Results  Component Value Date   CREATININE 0.93 12/28/2022   Stable overall, cont to avoid nephrotoxins   Cough Also for empiric antix, cxr r/o pna  Essential hypertension BP Readings from Last 3 Encounters:  12/28/22 136/78  10/26/22 120/66  10/14/22 (!) 125/53   Stable, pt to continue medical treatment card sr 90 bid   Hyperglycemia Lab Results  Component Value Date   HGBA1C 7.6 (H) 12/28/2022   Uncontrolled but Stable for age, pt to continue current medical treatment  - diet, wt control   Vitamin D deficiency Last vitamin D Lab Results  Component Value Date   VD25OH 38.93 12/28/2022   Low, to start oral replacement   Confusion With memory changes, hallucinations - for neuro planned f/u July 18, declines other changes in med tx for now  Bilateral hearing loss due to cerumen impaction  Ceruminosis is noted.  Wax is removed by syringing and manual debridement. Instructions for home care to prevent wax buildup are given.   Followup: No follow-ups on file.  Oliver Barre, MD 12/30/2022 4:29 PM [facility]  Primary Care - [facility]

## 2022-12-29 ENCOUNTER — Telehealth: Payer: Self-pay | Admitting: Urgent Care

## 2022-12-29 LAB — URINALYSIS, ROUTINE W REFLEX MICROSCOPIC
Bilirubin Urine: NEGATIVE
Ketones, ur: NEGATIVE
Nitrite: POSITIVE — AB
Specific Gravity, Urine: 1.015 (ref 1.000–1.030)
Total Protein, Urine: NEGATIVE
Urine Glucose: NEGATIVE
Urobilinogen, UA: 1 (ref 0.0–1.0)
pH: 5.5 (ref 5.0–8.0)

## 2022-12-29 LAB — HEMOGLOBIN A1C: Hgb A1c MFr Bld: 7.6 % — ABNORMAL HIGH (ref 4.6–6.5)

## 2022-12-29 NOTE — Telephone Encounter (Signed)
Pt to be contacted by CMA to ensure no urinary sx given her UA results

## 2022-12-30 DIAGNOSIS — H6123 Impacted cerumen, bilateral: Secondary | ICD-10-CM | POA: Insufficient documentation

## 2022-12-30 NOTE — Assessment & Plan Note (Signed)
Also for empiric antix, cxr r/o pna

## 2022-12-30 NOTE — Assessment & Plan Note (Signed)
Ceruminosis is noted.  Wax is removed by syringing and manual debridement. Instructions for home care to prevent wax buildup are given.  

## 2022-12-30 NOTE — Assessment & Plan Note (Signed)
Lab Results  Component Value Date   HGBA1C 7.6 (H) 12/28/2022   Uncontrolled but Stable for age, pt to continue current medical treatment  - diet, wt control

## 2022-12-30 NOTE — Assessment & Plan Note (Signed)
Last vitamin D Lab Results  Component Value Date   VD25OH 38.93 12/28/2022   Low, to start oral replacement

## 2022-12-30 NOTE — Assessment & Plan Note (Signed)
Lab Results  Component Value Date   CREATININE 0.93 12/28/2022   Stable overall, cont to avoid nephrotoxins

## 2022-12-30 NOTE — Assessment & Plan Note (Addendum)
With memory changes, hallucinations - for neuro planned f/u July 18, declines other changes in med tx for now

## 2022-12-30 NOTE — Assessment & Plan Note (Signed)
With worsening symptoms is appears, ok for depomedrol 80 mg IM today, f/u pulm as planned

## 2022-12-30 NOTE — Assessment & Plan Note (Signed)
BP Readings from Last 3 Encounters:  12/28/22 136/78  10/26/22 120/66  10/14/22 (!) 125/53   Stable, pt to continue medical treatment card sr 90 bid

## 2022-12-30 NOTE — Assessment & Plan Note (Signed)
Lab Results  Component Value Date   VITAMINB12 355 12/28/2022   Stable, cont oral replacement - b12 1000 mcg qd

## 2022-12-31 DIAGNOSIS — I129 Hypertensive chronic kidney disease with stage 1 through stage 4 chronic kidney disease, or unspecified chronic kidney disease: Secondary | ICD-10-CM | POA: Diagnosis not present

## 2022-12-31 DIAGNOSIS — G629 Polyneuropathy, unspecified: Secondary | ICD-10-CM | POA: Diagnosis not present

## 2022-12-31 DIAGNOSIS — N1831 Chronic kidney disease, stage 3a: Secondary | ICD-10-CM | POA: Diagnosis not present

## 2022-12-31 DIAGNOSIS — J849 Interstitial pulmonary disease, unspecified: Secondary | ICD-10-CM | POA: Diagnosis not present

## 2022-12-31 DIAGNOSIS — G9341 Metabolic encephalopathy: Secondary | ICD-10-CM | POA: Diagnosis not present

## 2022-12-31 DIAGNOSIS — M1990 Unspecified osteoarthritis, unspecified site: Secondary | ICD-10-CM | POA: Diagnosis not present

## 2023-01-03 ENCOUNTER — Encounter: Payer: Self-pay | Admitting: Pulmonary Disease

## 2023-01-03 ENCOUNTER — Encounter (HOSPITAL_COMMUNITY): Payer: Self-pay

## 2023-01-03 ENCOUNTER — Telehealth: Payer: Self-pay

## 2023-01-03 ENCOUNTER — Other Ambulatory Visit: Payer: Self-pay

## 2023-01-03 ENCOUNTER — Emergency Department (HOSPITAL_COMMUNITY): Payer: Medicare Other

## 2023-01-03 ENCOUNTER — Emergency Department (HOSPITAL_COMMUNITY)
Admission: EM | Admit: 2023-01-03 | Discharge: 2023-01-03 | Disposition: A | Discharge status: Home / Self Care | Payer: Medicare Other | Attending: Emergency Medicine | Admitting: Emergency Medicine

## 2023-01-03 DIAGNOSIS — J189 Pneumonia, unspecified organism: Secondary | ICD-10-CM | POA: Diagnosis not present

## 2023-01-03 DIAGNOSIS — R059 Cough, unspecified: Secondary | ICD-10-CM | POA: Diagnosis not present

## 2023-01-03 DIAGNOSIS — J849 Interstitial pulmonary disease, unspecified: Secondary | ICD-10-CM | POA: Diagnosis not present

## 2023-01-03 DIAGNOSIS — G629 Polyneuropathy, unspecified: Secondary | ICD-10-CM | POA: Diagnosis not present

## 2023-01-03 DIAGNOSIS — E7800 Pure hypercholesterolemia, unspecified: Secondary | ICD-10-CM | POA: Diagnosis not present

## 2023-01-03 DIAGNOSIS — N1831 Chronic kidney disease, stage 3a: Secondary | ICD-10-CM | POA: Diagnosis not present

## 2023-01-03 DIAGNOSIS — Z8542 Personal history of malignant neoplasm of other parts of uterus: Secondary | ICD-10-CM | POA: Insufficient documentation

## 2023-01-03 DIAGNOSIS — I129 Hypertensive chronic kidney disease with stage 1 through stage 4 chronic kidney disease, or unspecified chronic kidney disease: Secondary | ICD-10-CM | POA: Insufficient documentation

## 2023-01-03 DIAGNOSIS — R0602 Shortness of breath: Secondary | ICD-10-CM | POA: Diagnosis not present

## 2023-01-03 DIAGNOSIS — K2100 Gastro-esophageal reflux disease with esophagitis, without bleeding: Secondary | ICD-10-CM | POA: Diagnosis not present

## 2023-01-03 DIAGNOSIS — Z7901 Long term (current) use of anticoagulants: Secondary | ICD-10-CM | POA: Diagnosis not present

## 2023-01-03 DIAGNOSIS — M1990 Unspecified osteoarthritis, unspecified site: Secondary | ICD-10-CM | POA: Diagnosis not present

## 2023-01-03 DIAGNOSIS — N183 Chronic kidney disease, stage 3 unspecified: Secondary | ICD-10-CM | POA: Insufficient documentation

## 2023-01-03 DIAGNOSIS — R918 Other nonspecific abnormal finding of lung field: Secondary | ICD-10-CM | POA: Diagnosis not present

## 2023-01-03 DIAGNOSIS — Z87891 Personal history of nicotine dependence: Secondary | ICD-10-CM | POA: Diagnosis not present

## 2023-01-03 DIAGNOSIS — G9341 Metabolic encephalopathy: Secondary | ICD-10-CM | POA: Diagnosis not present

## 2023-01-03 DIAGNOSIS — G4730 Sleep apnea, unspecified: Secondary | ICD-10-CM | POA: Diagnosis not present

## 2023-01-03 DIAGNOSIS — E785 Hyperlipidemia, unspecified: Secondary | ICD-10-CM | POA: Diagnosis not present

## 2023-01-03 DIAGNOSIS — F32A Depression, unspecified: Secondary | ICD-10-CM | POA: Diagnosis not present

## 2023-01-03 DIAGNOSIS — R32 Unspecified urinary incontinence: Secondary | ICD-10-CM | POA: Diagnosis not present

## 2023-01-03 DIAGNOSIS — I700 Atherosclerosis of aorta: Secondary | ICD-10-CM | POA: Diagnosis not present

## 2023-01-03 DIAGNOSIS — Z9981 Dependence on supplemental oxygen: Secondary | ICD-10-CM | POA: Diagnosis not present

## 2023-01-03 DIAGNOSIS — J8410 Pulmonary fibrosis, unspecified: Secondary | ICD-10-CM | POA: Diagnosis not present

## 2023-01-03 DIAGNOSIS — K5900 Constipation, unspecified: Secondary | ICD-10-CM | POA: Diagnosis not present

## 2023-01-03 LAB — CBC
HCT: 37.5 % (ref 36.0–46.0)
Hemoglobin: 12.1 g/dL (ref 12.0–15.0)
MCH: 32.5 pg (ref 26.0–34.0)
MCHC: 32.3 g/dL (ref 30.0–36.0)
MCV: 100.8 fL — ABNORMAL HIGH (ref 80.0–100.0)
Platelets: 207 10*3/uL (ref 150–400)
RBC: 3.72 MIL/uL — ABNORMAL LOW (ref 3.87–5.11)
RDW: 13.1 % (ref 11.5–15.5)
WBC: 10.5 10*3/uL (ref 4.0–10.5)
nRBC: 0 % (ref 0.0–0.2)

## 2023-01-03 LAB — BASIC METABOLIC PANEL
Anion gap: 10 (ref 5–15)
BUN: 16 mg/dL (ref 8–23)
CO2: 37 mmol/L — ABNORMAL HIGH (ref 22–32)
Calcium: 8.9 mg/dL (ref 8.9–10.3)
Chloride: 92 mmol/L — ABNORMAL LOW (ref 98–111)
Creatinine, Ser: 0.98 mg/dL (ref 0.44–1.00)
GFR, Estimated: 58 mL/min — ABNORMAL LOW (ref >60)
Glucose, Bld: 214 mg/dL — ABNORMAL HIGH (ref 70–99)
Potassium: 4 mmol/L (ref 3.5–5.1)
Sodium: 139 mmol/L (ref 135–145)

## 2023-01-03 NOTE — ED Triage Notes (Signed)
Patient brought in from home via EMS for shortness of breath. Patient uses 3L of oxygen at home at baseline. Patient states she was seen by her PCP last week. They advised she had PNA and needed to come to the ER. Patient states she has been dealing with pneumonia for 6-8 weeks.

## 2023-01-03 NOTE — Discharge Instructions (Addendum)
You were seen today for shortness of breath, and concern for pneumonia.  We got a chest x-ray and CT scan which were reassuring that you do not have any pneumonia at this time.  Additionally, your laboratory work did not have an elevated white blood cell count to suggest any infection at this time.  Please plan to follow-up with your primary care doctor soon as you are able, additionally keep your upcoming appointments with cardiology and pulmonology.  Return to the emergency department if you have any new or worsening symptoms or concerns including worsening shortness of breath or need to keep increasing her oxygen at home.

## 2023-01-03 NOTE — Telephone Encounter (Signed)
This message just seen.  Please to call pt family - pt should go to ED now as she has likely "double pneumonia" and may need inpatient stay given her other lung problems

## 2023-01-03 NOTE — Telephone Encounter (Signed)
Called pt # no answer LMOM RTC.. called husband [phone] gave him MD response. Husband states he will take her to [facility].Kathy KitchenRaechel Chute

## 2023-01-03 NOTE — ED Provider Notes (Signed)
[place] EMERGENCY DEPARTMENT AT [facility] Provider Note   HPI: Kathy [name] 82 year old female with a past medical history as below, significant for CKD, GERD, hypertension, interstitial lung disease here for evaluation of increasing shortness of breath.  She reports that she has been having shortness of breath which she says is worse than baseline.  She went to see her primary care who obtained a chest x-ray that was concerning for bilateral pneumonia, when she got the results she was instructed to come to the emergency department for further workup.  Denies any fevers, chills, productive cough, nausea, vomiting chest pain or any other complaint at this time.  Past Medical History:  Diagnosis Date   Arthritis    fingers   CKD (chronic kidney disease) stage 3, GFR 30-59 ml/min (HCC) 11/30/2017   Depression    Dizziness    in AM, getting out of bed   Dysrhythmia    A fib   Endometrial ca (HCC) 11/30/2017   S/p surgury 1990's   Fall    GERD (gastroesophageal reflux disease) 11/30/2017   HLD (hyperlipidemia) 11/30/2017   Hypercholesteremia    Hypertension    Interstitial lung disease (HCC)    Neuropathy    bilateral feet   Pneumonia 12/14/2021   Shortness of breath dyspnea    Sleep apnea    has CPAP, doesn't use   Umbilical hernia     Past Surgical History:  Procedure Laterality Date   ABDOMINAL HYSTERECTOMY     BROW LIFT Bilateral 07/15/2015   Procedure: BLEPHAROPLASTY;  Surgeon: Imagene Riches, MD;  Location: [facility];  Service: Ophthalmology;  Laterality: Bilateral;   BUBBLE STUDY  08/18/2022   Procedure: BUBBLE STUDY;  Surgeon: Tessa Lerner, DO;  Location: [facility] END[facility]COPY;  Service: Cardiovascular;;   CARDIOVERSION N/A 08/18/2022   Procedure: CARDIOVERSION;  Surgeon: Tessa Lerner, DO;  Location: [facility] END[facility];  Service: Cardiovascular;  Laterality: N/A;   CATARACT EXTRACTION W/PHACO Right 12/31/2021   Procedure: CATARACT EXTRACTION PHACO AND INTRAOCULAR  LENS PLACEMENT (IOC) RIGHT;  Surgeon: Estanislado Pandy, MD;  Location: [facility];  Service: Ophthalmology;  Laterality: Right;  17.11 1:46.1   CHOLECYSTECTOMY     HAMMER TOE SURGERY     HERNIA REPAIR     KNEE ARTHROSCOPY Bilateral    PTOSIS REPAIR Bilateral 07/15/2015   Procedure: PTOSIS REPAIR;  Surgeon: Imagene Riches, MD;  Location: [facility];  Service: Ophthalmology;  Laterality: Bilateral;  CPAP   TEE WITHOUT CARDIOVERSION N/A 08/18/2022   Procedure: TRANSESOPHAGEAL ECHOCARDIOGRAM (TEE);  Surgeon: Tessa Lerner, DO;  Location: [facility] ENDOS[facility];  Service: Cardiovascular;  Laterality: N/A;   TONSILLECTOMY       Social History   Tobacco Use   Smoking status: Former    Packs/day: 1.00    Years: 10.00    Additional pack years: 0.00    Total pack years: 10.00    Types: Cigarettes    Quit date: 06/28/1986    Years since quitting: 36.5   Smokeless tobacco: Never   Tobacco comments:    quit 40+ yrs ago, 1 PPD for a few years  Vaping Use   Vaping Use: Never used  Substance Use Topics   Alcohol use: No   Drug use: No      Review of Systems  Constitutional:  Negative for appetite change, chills, fatigue and fever.  Respiratory:  Positive for shortness of breath. Negative for chest tightness.   Cardiovascular:  Negative for chest pain and  palpitations.  Gastrointestinal:  Negative for abdominal pain, nausea and vomiting.  All other systems reviewed and are negative.   A complete ROS was performed with pertinent positives/negatives noted in the HPI.   Vitals:   01/03/23 1634 01/03/23 1900  BP: 110/67 (!) 129/111  Pulse: 71 73  Resp: 17 20  Temp: 97.9 F (36.6 C)   SpO2: 100% 98%    Physical Exam Vitals and nursing note reviewed.  Constitutional:      General: She is not in acute distress.    Appearance: She is well-developed.  HENT:     Head: Normocephalic and atraumatic.  Eyes:     Conjunctiva/sclera: Conjunctivae normal.  Cardiovascular:      Rate and Rhythm: Normal rate and regular rhythm.     Heart sounds: No murmur heard. Pulmonary:     Effort: Pulmonary effort is normal. No respiratory distress.     Breath sounds: Normal breath sounds. No decreased breath sounds.  Abdominal:     Palpations: Abdomen is soft.     Tenderness: There is no abdominal tenderness.  Musculoskeletal:        General: No swelling.     Cervical back: Neck supple.     Right lower leg: No edema.     Left lower leg: No edema.  Skin:    General: Skin is warm and dry.     Capillary Refill: Capillary refill takes less than 2 seconds.  Neurological:     Mental Status: She is alert and oriented to person, place, and time.  Psychiatric:        Mood and Affect: Mood normal.        Behavior: Behavior normal.     Procedures  MDM:  Imaging/radiology results:  CT Chest Wo Contrast  Result Date: 01/03/2023 CLINICAL DATA:  Pneumonia. EXAM: CT CHEST WITHOUT CONTRAST TECHNIQUE: Multidetector CT imaging of the chest was performed following the standard protocol without IV contrast. RADIATION DOSE REDUCTION: This exam was performed according to the departmental dose-optimization program which includes automated exposure control, adjustment of the mA and/or kV according to patient size and/or use of iterative reconstruction technique. COMPARISON:  Chest CT dated 11/29/2022. FINDINGS: Evaluation of this exam is limited in the absence of intravenous contrast. Cardiovascular: There is no cardiomegaly or pericardial effusion. Advanced 3 vessel coronary vascular calcification. Moderate atherosclerotic calcification of the thoracic aorta. No aneurysmal dilatation. There is mild dilatation of the main pulmonary trunk suggestive of pulmonary hypertension. Clinical correlation is recommended. Mediastinum/Nodes: No hilar adenopathy. The esophagus is grossly unremarkable. No mediastinal fluid collection. Lungs/Pleura: Background of chronic interstitial coarsening and fibrosis. No  new consolidation. There is no pleural effusion or pneumothorax. The central airways are patent. Upper Abdomen: Cholecystectomy. No acute findings in the upper abdomen. Similar appearance of a hypodense lesion in the upper pole of the right kidney., likely a cyst. Musculoskeletal: No acute osseous pathology. IMPRESSION: 1. No acute intrathoracic pathology. 2. Chronic interstitial lung disease. 3.  Aortic Atherosclerosis (ICD10-I70.0). Electronically Signed   By: Elgie Collard M.D.   On: 01/03/2023 18:54   DG Chest 2 View  Result Date: 01/03/2023 CLINICAL DATA:  Shortness of breath EXAM: CHEST - 2 VIEW COMPARISON:  Previous studies including the examination of 12/28/2022 FINDINGS: There is poor inspiration. Transverse diameter of heart is increased. There are patchy infiltrates in both perihilar regions and left lower lung field with interval improvement. There are no signs of alveolar pulmonary edema. Lateral CP angles are clear. There is no  pneumothorax. Surgical clips are seen in gallbladder fossa. IMPRESSION: There are patchy infiltrates in both perihilar regions and left lower lung field suggesting multifocal pneumonia. There is interval partial clearing of the infiltrates. Poor inspiration. There is no significant pleural effusion or pneumothorax. Electronically Signed   By: Ernie Avena M.D.   On: 01/03/2023 17:25      EKG results:     Lab results: *** Results for orders placed or performed during the hospital encounter of 01/03/23 (from the past 24 hour(s))  Basic metabolic panel     Status: Abnormal   Collection Time: 01/03/23  4:36 PM  Result Value Ref Range   Sodium 139 135 - 145 mmol/L   Potassium 4.0 3.5 - 5.1 mmol/L   Chloride 92 (L) 98 - 111 mmol/L   CO2 37 (H) 22 - 32 mmol/L   Glucose, Bld 214 (H) 70 - 99 mg/dL   BUN 16 8 - 23 mg/dL   Creatinine, Ser 9.56 0.44 - 1.00 mg/dL   Calcium 8.9 8.9 - 21.3 mg/dL   GFR, Estimated 58 (L) >60 mL/min   Anion gap 10 5 - 15  CBC      Status: Abnormal   Collection Time: 01/03/23  4:36 PM  Result Value Ref Range   WBC 10.5 4.0 - 10.5 K/uL   RBC 3.72 (L) 3.87 - 5.11 MIL/uL   Hemoglobin 12.1 12.0 - 15.0 g/dL   HCT 08.6 57.8 - 46.9 %   MCV 100.8 (H) 80.0 - 100.0 fL   MCH 32.5 26.0 - 34.0 pg   MCHC 32.3 30.0 - 36.0 g/dL   RDW 62.9 52.8 - 41.3 %   Platelets 207 150 - 400 K/uL   nRBC 0.0 0.0 - 0.2 %     Key medications administered in the ER: *** Medications - No data to display  Consults: ***    Click here for ABCD2, HEART and other calculatorsREFRESH Note before signing   Medical decision making: -Vital signs stable. Patient afebrile, hemodynamically stable, and non-toxic appearing. -Patient's presentation is most consistent with severe exacerbation of chronic illness.. Kathy [name] is a 82 y.o. female presenting to the emergency department with shortness of breath in the setting of interstitial lung disease.  -Additional history obtained from husband, son at bedside -Per chart review, ***   Medical Decision Making Amount and/or Complexity of Data Reviewed Labs: ordered. Radiology: ordered.     The plan for this patient was discussed with Dr. Suezanne Jacquet, who voiced agreement and who oversaw evaluation and treatment of this patient.  Fayrene Helper, MD Emergency Medicine, PGY-2  Note: Dragon medical dictation software was used in the creation of this note.   Clinical Impression: No diagnosis found.  Rx / DC Orders ED Discharge Orders     None

## 2023-01-03 NOTE — Telephone Encounter (Signed)
CRITICAL VALUE STICKER  CRITICAL VALUE: New multifocal airspace disease in the right mid lung and left mid and lower lung, suspicious for multifocal pneumonia.  RECEIVER (on-site recipient of call): Kathy [name]  DATE & TIME NOTIFIED: 01/03/2023 @ 8:56 am  MESSENGER (representative from lab): Sherell with [facility]  MD NOTIFIED: Yes  TIME OF NOTIFICATION: 8:57 am  RESPONSE:

## 2023-01-03 NOTE — Telephone Encounter (Signed)
Daughter called back and was given directive to take pt the ED.

## 2023-01-04 ENCOUNTER — Other Ambulatory Visit: Payer: Self-pay

## 2023-01-04 MED ORDER — PREDNISONE 10 MG PO TABS: 20.0000 mg | ORAL_TABLET | Freq: Every day | ORAL | 1 refills | Status: DC

## 2023-01-04 MED ORDER — PREDNISONE 10 MG PO TABS: ORAL_TABLET | ORAL | 0 refills | Status: AC

## 2023-01-04 NOTE — Telephone Encounter (Signed)
I called and spoke with patient's daughter-in-law and reviewed CT scan that she got when she was in the ED yesterday.  There is no clear evidence of pneumonia on the chest imaging which just shows interstitial lung disease.  I suspect she has worsening ILD as the steroids are being tapered  Please send in a new prescription for prednisone 10 mg tablets.  The patient will go back up to 40 mg/day on the dose for 2 weeks and then 30 mg/day for 2 weeks and then 20 mg/day.  She will hold at that dose until she can be seen in clinic again.  Can you please see if she can be scheduled to see me at next available

## 2023-01-06 ENCOUNTER — Ambulatory Visit: Payer: Medicare Other | Admitting: Cardiology

## 2023-01-06 ENCOUNTER — Encounter: Payer: Self-pay | Admitting: Cardiology

## 2023-01-06 VITALS — BP 147/85 | HR 74 | Resp 16 | Ht 60.0 in | Wt 211.0 lb

## 2023-01-06 DIAGNOSIS — N1831 Chronic kidney disease, stage 3a: Secondary | ICD-10-CM | POA: Diagnosis not present

## 2023-01-06 DIAGNOSIS — G9341 Metabolic encephalopathy: Secondary | ICD-10-CM | POA: Diagnosis not present

## 2023-01-06 DIAGNOSIS — I4819 Other persistent atrial fibrillation: Secondary | ICD-10-CM

## 2023-01-06 DIAGNOSIS — G629 Polyneuropathy, unspecified: Secondary | ICD-10-CM | POA: Diagnosis not present

## 2023-01-06 DIAGNOSIS — I10 Essential (primary) hypertension: Secondary | ICD-10-CM | POA: Diagnosis not present

## 2023-01-06 DIAGNOSIS — M1990 Unspecified osteoarthritis, unspecified site: Secondary | ICD-10-CM | POA: Diagnosis not present

## 2023-01-06 DIAGNOSIS — I129 Hypertensive chronic kidney disease with stage 1 through stage 4 chronic kidney disease, or unspecified chronic kidney disease: Secondary | ICD-10-CM | POA: Diagnosis not present

## 2023-01-06 DIAGNOSIS — J849 Interstitial pulmonary disease, unspecified: Secondary | ICD-10-CM | POA: Diagnosis not present

## 2023-01-06 MED ORDER — DILTIAZEM HCL ER 90 MG PO CP12: 90.0000 mg | ORAL_CAPSULE | Freq: Two times a day (BID) | ORAL | 3 refills | Status: DC

## 2023-01-06 MED ORDER — APIXABAN 5 MG PO TABS
5.0000 mg | ORAL_TABLET | Freq: Two times a day (BID) | ORAL | 3 refills | Status: DC
Start: 2023-01-06 — End: 2023-04-18

## 2023-01-06 MED ORDER — SOTALOL HCL 80 MG PO TABS: 40.0000 mg | ORAL_TABLET | Freq: Two times a day (BID) | ORAL | 3 refills | Status: DC

## 2023-01-06 NOTE — Progress Notes (Signed)
Patient referred by Corwin Levins, MD for pulmonary hypertension  Subjective:   Kathy [name], female    DOB: 1940/08/14, 82 y.o.   MRN: [id number]   Chief Complaint  Patient presents with   Atrial Fibrillation   Follow-up    3 months      HPI  82 y.o. Caucasian female with hypertension, hyperlipidemia, interstitial lung disease, on 2 L O2, h/o COVID 06/2019, paroxysmal Afib  Patient had recent ER visit with shortness of breath, cough. CT chest showed worsening interstitia lung disease. Patient has also had worsening memory issues. Son and daughter in law present at this visit. No new cardiac complaints.   Initial consultation HPI 06/2020: Patient has been on 2 L oxygen for at least 1-2 years. COVID-19 in 2021 probably caused progression of her baseline ILD, according to Dr. Isaiah Serge. Currently, she is limited to walking inside the house with stable exertional dyspnea. She denies any chest pain. She was noted to be in Afib on echocardiogram in 01/2020, although I do not see any prior EKG's. Echocardiogram showed LVEF 45-50%, mild LA dilatation, estimated PASP 36 mmHg. She reports occasional lightheadedness, but denies any presyncope, syncope, chest pain. Leg edema is currently well controlled. She denies orthopnea, PND.     Current Outpatient Medications:    albuterol (VENTOLIN HFA) 108 (90 Base) MCG/ACT inhaler, Inhale 1-2 puffs into the lungs every 6 (six) hours as needed for wheezing or shortness of breath., Disp: 1 each, Rfl: 2   amitriptyline (ELAVIL) 50 MG tablet, Take 1 tablet (50 mg total) by mouth at bedtime as needed for sleep. And neuropathy, Disp: , Rfl:    apixaban (ELIQUIS) 5 MG TABS tablet, Take 1 tablet (5 mg total) by mouth 2 (two) times daily., Disp: 180 tablet, Rfl: 3   Cholecalciferol (VITAMIN D-3) 25 MCG (1000 UT) CAPS, Take 1 capsule by mouth daily., Disp: , Rfl:    cyanocobalamin (VITAMIN B12) 1000 MCG/ML injection, INJECT 1 ML INTO MUSCLE EVERY 30 DAYS, Disp: 3 mL,  Rfl: 1   diltiazem (CARDIZEM SR) 90 MG 12 hr capsule, TAKE 1 CAPSULE BY MOUTH EVERY TWELVE HOURS, Disp: 60 capsule, Rfl: 2   docusate sodium (COLACE) 100 MG capsule, Take 1 capsule (100 mg total) by mouth 2 (two) times daily. (Patient taking differently: Take 100 mg by mouth daily.), Disp: 30 capsule, Rfl: 2   LASIX 20 MG tablet, daily. Taking extra 1/2 tab for swelling, Disp: , Rfl:    pantoprazole (PROTONIX) 40 MG tablet, TAKE 1 TABLET BY MOUTH EVERY DAY, Disp: 90 tablet, Rfl: 3   predniSONE (DELTASONE) 10 MG tablet, Take 4 tablets (40 mg total) by mouth daily with breakfast for 14 days, THEN 3 tablets (30 mg total) daily with breakfast for 14 days, THEN 2 tablets (20 mg total) daily with breakfast for 14 days., Disp: 126 tablet, Rfl: 0   Respiratory Therapy Supplies (FLUTTER) DEVI, Use as directed, Disp: 1 each, Rfl: 0   sertraline (ZOLOFT) 25 MG tablet, Take 1 tablet (25 mg total) by mouth daily., Disp: 30 tablet, Rfl: 2   sotalol (BETAPACE) 80 MG tablet, TAKE 1/2 TABLET BY MOUTH EVERY 12 HOURS., Disp: 30 tablet, Rfl: 0   triamcinolone cream (KENALOG) 0.5 %, Apply 1 Application topically 2 (two) times daily as needed. (Patient taking differently: Apply 1 Application topically 2 (two) times daily as needed (for itching).), Disp: 454 g, Rfl: 1   predniSONE (DELTASONE) 10 MG tablet, Take 2 tablets (20 mg total) by  mouth daily with breakfast. (Patient not taking: Reported on 01/06/2023), Disp: 60 tablet, Rfl: 1  Cardiovascular and other pertinent studies:  EKG 09/08/2022: Sinus rhythm 56 bpm QTc 468 msec  TEE/cardioversion 08/18/2022  TEE 08/18/2022:  1. Left ventricular ejection fraction, by estimation, is 50 to 55%. The  left ventricle has low normal function. The left ventricle has no regional  wall motion abnormalities.   2. Right ventricular systolic function is normal. The right ventricular  size is normal.   3. Left atrial size was moderately dilated. No left atrial/left atrial   appendage thrombus was detected. The LAA emptying velocity was 26 cm/s.   4. A small pericardial effusion is present. The pericardial effusion is  anterior to the right ventricle. There is no evidence of cardiac  tamponade.   5. The mitral valve is degenerative. Mild mitral valve regurgitation. No  evidence of mitral stenosis.   6. The aortic valve is tricuspid. Aortic valve regurgitation is not  visualized. Aortic valve sclerosis is present, with no evidence of aortic  valve stenosis.   7. There is mild (Grade II) plaque involving the descending aorta and  ascending aorta.   8. Small PFO cannot be ruled out per color doppler. Agitated saline  contrast bubble study was negative, with no evidence of any interatrial  shunt.   9. Rhythm strip during this exam demonstrates atrial fibrillation.   Comparison(s): TTE from 02/18/2020: LVEF 45-50%, indeterminate diastolic  parameter, RVSP 36.13mmHG, mild LAE, estimated RAP .   Conclusion(s)/Recommendation(s): No LA/LAA thrombus identified. Successful  cardioversion performed with restoration of normal sinus rhythm.   CT Chest 02/08/2020: 1. 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 Findings may be minimally progressive from 01/11/2018 and the possibility of superimposed post COVID-19 inflammatory fibrosis cannot be excluded. Findings are suggestive of an alternative diagnosis (not UIP) per consensus guidelines: Diagnosis of Idiopathic Pulmonary Fibrosis: An Official ATS/ERS/JRS/ALAT Clinical Practice Guideline. Am Rosezetta Schlatter Crit Care Med Vol 198, Iss 5, [phone], Feb 26 2017. 2. Aortic atherosclerosis (ICD10-I70.0). Coronary artery calcification. 3. Enlarged pulmonic trunk, indicative of pulmonary arterial hypertension.     Recent labs: 01/03/2023: Glucose 214, BUN/Cr 16/0.98. EGFR 58. Na/K 139/4.0. Rest of the CMP normal H/H 12/37. MCV 100. Platelets 207 HbA1C 7.6% Chol  205, TG 100, HDL 66, LDL 118 TSH 1.5 normal  08/01/2022: Glucose 112, BUN/Cr 17/1.1. EGFR 50. Na/K 139/4.5.  H/H 10.7/34.3. MCV 103. Platelets 190 HbA1C NA Chol 149, TG 77, HDL 55, LDL 78 TSH NA   Review of Systems  Cardiovascular:  Negative for chest pain, dyspnea on exertion, leg swelling, palpitations and syncope.         Vitals:   01/06/23 1459  BP: (!) 147/85  Pulse: 74  Resp: 16  SpO2: 90%    Body mass index is 41.21 kg/m. Filed Weights   01/06/23 1459  Weight: 211 lb (95.7 kg)      Objective:   Physical Exam Vitals and nursing note reviewed.  Constitutional:      General: She is not in acute distress.    Appearance: She is obese.  Neck:     Vascular: No JVD.  Cardiovascular:     Rate and Rhythm: Normal rate and regular rhythm.     Heart sounds: Normal heart sounds. No murmur heard. Pulmonary:     Effort: Pulmonary effort is normal.     Breath sounds: Normal breath sounds. No wheezing or rales.     Comments: On  supplemental oxygen Musculoskeletal:     Right lower leg: No edema.     Left lower leg: No edema.          Assessment & Recommendations:   81 y.o. Caucasian female with hypertension, hyperlipidemia, interstitial lung disease, on 2 L O2, h/o COVID 06/2019, paroxysmal Afib, HFpEF  Paroxysmal Afib: TEE cardioversion (2/21/204) In sinus rhythm today. Continue diltiazem SR 90 mg bid, sotalol 40 mg bid. CHA2DS2VASc score 4, annual stroke risk 5%.  Continue eliquis 5 mg bid.   HFpEF: EF 50-55% (2/204). Continue lasix 40 mg daily.  Hypertension: Controlled  F/u in 1 year    Elder Negus, MD Pager: [phone] Office: [phone]

## 2023-01-11 ENCOUNTER — Telehealth: Payer: Self-pay | Admitting: Internal Medicine

## 2023-01-11 DIAGNOSIS — G9341 Metabolic encephalopathy: Secondary | ICD-10-CM | POA: Diagnosis not present

## 2023-01-11 DIAGNOSIS — N1831 Chronic kidney disease, stage 3a: Secondary | ICD-10-CM | POA: Diagnosis not present

## 2023-01-11 DIAGNOSIS — G629 Polyneuropathy, unspecified: Secondary | ICD-10-CM | POA: Diagnosis not present

## 2023-01-11 DIAGNOSIS — M1990 Unspecified osteoarthritis, unspecified site: Secondary | ICD-10-CM | POA: Diagnosis not present

## 2023-01-11 DIAGNOSIS — I129 Hypertensive chronic kidney disease with stage 1 through stage 4 chronic kidney disease, or unspecified chronic kidney disease: Secondary | ICD-10-CM | POA: Diagnosis not present

## 2023-01-11 DIAGNOSIS — J849 Interstitial pulmonary disease, unspecified: Secondary | ICD-10-CM | POA: Diagnosis not present

## 2023-01-11 NOTE — Telephone Encounter (Signed)
Marchelle Folks from [facility] called and said the patient had a blood pressure reading of 137/93 today. She also said the patient's daughter-in-law reported increased confusion and that the patient's blood sugar the other night was over 300. Marchelle Folks suggested calling the patient's son or daughter-in-law for more information. Best callback for Marchelle Folks is [phone].

## 2023-01-11 NOTE — Telephone Encounter (Signed)
Needs ROV or UC or ED as there may be an underlying problem, thanks

## 2023-01-12 NOTE — Telephone Encounter (Signed)
Rondall Allegra gave her MD response. She is wanting [place] to call daughter. We call Lupita Leash and she states she was back in [place] and she would call her on [address]. Sister-in-law Marylu Lund) states they made a call to Dr. Isaiah Serge w/pulmonology. He have her taking high dose of prednisone and that why her BS running high. She states mother law always confuse. She states she was confuse when they saw Dr. Jonny Ruiz and he didn't order anything. They are waiting on Dr. Harold Hedge

## 2023-01-13 ENCOUNTER — Encounter: Payer: Self-pay | Admitting: Neurology

## 2023-01-13 ENCOUNTER — Ambulatory Visit: Payer: Medicare Other | Admitting: Neurology

## 2023-01-13 VITALS — BP 158/76 | HR 71 | Ht 60.0 in | Wt 213.0 lb

## 2023-01-13 DIAGNOSIS — R443 Hallucinations, unspecified: Secondary | ICD-10-CM

## 2023-01-13 DIAGNOSIS — F0280 Dementia in other diseases classified elsewhere without behavioral disturbance: Secondary | ICD-10-CM

## 2023-01-13 DIAGNOSIS — Z7952 Long term (current) use of systemic steroids: Secondary | ICD-10-CM | POA: Diagnosis not present

## 2023-01-13 DIAGNOSIS — J849 Interstitial pulmonary disease, unspecified: Secondary | ICD-10-CM | POA: Diagnosis not present

## 2023-01-13 DIAGNOSIS — R739 Hyperglycemia, unspecified: Secondary | ICD-10-CM

## 2023-01-13 MED ORDER — MEMANTINE HCL 10 MG PO TABS
10.0000 mg | ORAL_TABLET | Freq: Two times a day (BID) | ORAL | 5 refills | Status: DC
Start: 2023-01-13 — End: 2023-04-13

## 2023-01-13 NOTE — Patient Instructions (Signed)
It was nice to see you again today.  I am sorry to hear that your memory function has declined.  Your memory loss is likely due to underlying medical conditions, primarily your interstitial lung disease, and oxygen dependence, blood sugar fluctuations, tendency for dehydration, blood pressure fluctuations. Prednisone can cause hallucinations.   I recommend:  We will start you for your memory on Namenda (generic name: Memantine), starting at 5 mg twice daily (1/2 pill twice daily) with gradual buildup to 10 mg twice daily. Please note that side effects may include, but are not limited to: nausea, confusion, hallucination, personality changes. If you are having mild side effects, try to stick with the treatment as these initial side effects may go away after the first 10-14 days.    Try to drink more water, 6-8 cups/day, 8 oz size each.  Use your walker at all times for fall prevention.  Follow up closely with your new primary care and lung doctor.

## 2023-01-13 NOTE — Progress Notes (Signed)
Subjective:    Patient ID: Kathy [name] is a 82 y.o. female.  HPI    Interim history:   Kathy [name] is an 82 year old female with an underlying complex medical history of chronic kidney disease, arthritis, endometrial cancer, hypertension, hyperlipidemia, reflux disease, interstitial lung disease, chronic respiratory failure with oxygen dependence, neuropathy, sleep apnea (not currently on PAP therapy), arthritis, hospitalization for pneumonia, UTI and sepsis, atrial fibrillation, status post cardioversion in February 2024, and severe obesity with a BMI of over 40, who presents for follow-up consultation of her memory loss.  The patient is accompanied by her son and daughter-in-law today.  I first met her at the request of her primary care physician on 10/12/2022, at which time she reported forgetfulness for the past several months.  She had taken a fall recently.  She had a recent hospitalization for UTI and sepsis.  She reported being on oxygen 24-7, unable to tolerate CPAP in the past.  She had been diagnosed with vitamin B12 deficiency.  She was advised to follow-up with her PCP, finished her antibiotics for a recent pneumonia, discussed resuming vitamin B12 injections, and follow-up with her pulmonologist for sleep apnea to discuss treatment.  She was advised to use her walker at all times, increase her water intake, decrease her soda intake.  Because of her recent fall for which she has not sought medical attention I ordered a head CT without contrast.  I did not suggest any new medications at the time for her memory loss and recommended that she be medically more stable before we consider medication for memory loss.  Her heart rate was 52 at the time, I recommended avoiding Aricept.  She had a head CT without contrast on 10/13/2022 and I reviewed the results:   IMPRESSION: CT scan of the head without contrast showing age-appropriate changes of chronic small vessel disease and generalized cerebral  atrophy.  No acute abnormalities are noted.  In addition, I personally reviewed the images through the PACS system.  Generalized atrophy was appreciated on the scans.  She was notified via phone call.  Today, 01/13/2023: She reports very little, history is heavily supplemented by her son and daughter-in-law.  She reports feeling okay, she had a recent fall, she fell on the way to the bedroom from the bathroom.  She had not taken her walker to the bathroom.  Her blood sugar was elevated per EMS, she has been on prednisone for her lung disease, currently at 40 mg daily.  Her blood pressure has been fluctuating and her blood sugar has been elevated.  She takes monthly B12 injections, she does not hydrate very well, her son estimates that she drinks about a bottle of water per day, patient estimates that it is about 2 ounces of water per day.  She is on 24-7 oxygen supplementation at 3 L/min.  She drinks 1 cup of coffee in the morning. Per daughter-in-law, they are looking into transitioning her to a different primary care, someone who makes house calls, discussion about palliative care was also started with her pulmonologist.  She currently has home health therapy.  She has had hearing loss.  She has had auditory and visual hallucinations.  She was recently seen in the emergency room for shortness of breath.  The patient's allergies, current medications, family history, past medical history, past social history, past surgical history and problem list were reviewed and updated as appropriate.   Previously:    reports forgetfulness for the past several months.  She had a fall in November 2023.  She had a severe UTI and sepsis and was hospitalized in [place] as she was visiting her daughter.  Her daughter lives in [place].  She has 2 sons in [place], locally, daughter in law is also involved in her care.  She fell last week but did not seek medical attention, she fell on her left side and bruised  her left shoulder area but also hit her head on the back.  She did not lose consciousness and denies any severe headache at this time but did not seek medical attention either.   She reports that she could not tolerate her CPAP, sleep testing was over 10 years ago and she no longer has her CPAP machine but she has been on 24-7 oxygen for the past 3 years.    She does not hydrate very well, she estimates that she drinks about 2 bottles of water per day.  She likes to drink soda, up to 3 bottles per day but has not had any in the past couple of months since she has been sick.  She is finishing antibiotics for pneumonia.   She uses a walker at home.  She is currently in a wheelchair.  She lives with her husband.  She is a non-smoker and does not drink alcohol.     She typically gets B12 injections but has not had any in the past 2 months per daughter.   I reviewed your office note from 10/04/2022.   She had a recent brain MRI without contrast when she was hospitalized in February, on 08/14/2022, indication of altered mental status and cough.  I reviewed the results: Impression: No acute intracranial process.  No evidence of acute or subacute infarct.  In the narrative of the report she was noted to have dilated perivascular spaces in the bilateral basal ganglia.  Scattered remote lacunar infarcts in the bilateral lentiform nuclei.  Scattered T2 hyperintense signal in the perivascular white matter and pons, likely sequelae of moderate chronic small vessel ischemic disease.  I personally reviewed the images through the PACS system.  Generalized atrophy was noted.   She has upcoming appointments with cardiology and pulmonology.  She is currently on Eliquis.  She takes amitriptyline at bedtime.   TSH in January 2024 was in the normal range, A1c was 5.8, vitamin D 41.5.  Lipid panel showed benign findings with total cholesterol of 149, HDL 55.9, LDL 78.  Her vitamin B12 was borderline low at 212.   Her Past  Medical History Is Significant For: Past Medical History:  Diagnosis Date   Arthritis    fingers   CKD (chronic kidney disease) stage 3, GFR 30-59 ml/min (HCC) 11/30/2017   Depression    Dizziness    in AM, getting out of bed   Dysrhythmia    A fib   Endometrial ca (HCC) 11/30/2017   S/p surgury 1990's   Fall    GERD (gastroesophageal reflux disease) 11/30/2017   HLD (hyperlipidemia) 11/30/2017   Hypercholesteremia    Hypertension    Interstitial lung disease (HCC)    Neuropathy    bilateral feet   Pneumonia 12/14/2021   Shortness of breath dyspnea    Sleep apnea    has CPAP, doesn't use   Umbilical hernia     Her Past Surgical History Is Significant For: Past Surgical History:  Procedure Laterality Date   ABDOMINAL HYSTERECTOMY     BROW LIFT Bilateral 07/15/2015   Procedure: BLEPHAROPLASTY;  Surgeon: Imagene Riches, MD;  Location: [facility];  Service: Ophthalmology;  Laterality: Bilateral;   BUBBLE STUDY  08/18/2022   Procedure: BUBBLE STUDY;  Surgeon: Tessa Lerner, DO;  Location: [facility];  Service: Cardiovascular;;   CARDIOVERSION N/A 08/18/2022   Procedure: CARDIOVERSION;  Surgeon: Tessa Lerner, DO;  Location: [facility];  Service: Cardiovascular;  Laterality: N/A;   CATARACT EXTRACTION W/PHACO Right 12/31/2021   Procedure: CATARACT EXTRACTION PHACO AND INTRAOCULAR LENS PLACEMENT (IOC) RIGHT;  Surgeon: Estanislado Pandy, MD;  Location: [facility];  Service: Ophthalmology;  Laterality: Right;  17.11 1:46.1   CHOLECYSTECTOMY     HAMMER TOE SURGERY     HERNIA REPAIR     KNEE ARTHROSCOPY Bilateral    PTOSIS REPAIR Bilateral 07/15/2015   Procedure: PTOSIS REPAIR;  Surgeon: Imagene Riches, MD;  Location: [facility];  Service: Ophthalmology;  Laterality: Bilateral;  CPAP   TEE WITHOUT CARDIOVERSION N/A 08/18/2022   Procedure: TRANSESOPHAGEAL ECHOCARDIOGRAM (TEE);  Surgeon: Tessa Lerner, DO;  Location: [facility];  Service: Cardiovascular;   Laterality: N/A;   TONSILLECTOMY      Her Family History Is Significant For: Family History  Problem Relation Age of Onset   Congestive Heart Failure Mother    Stroke Father    Parkinson's disease Father    Diabetes Son     Her Social History Is Significant For: Social History   Socioeconomic History   Marital status: Married    Spouse name: Not on file   Number of children: 3   Years of education: Not on file   Highest education level: Associate degree: occupational, Scientist, product/process development, or vocational program  Occupational History   Occupation: Retired  Tobacco Use   Smoking status: Former    Current packs/day: 0.00    Average packs/day: 1 pack/day for 10.0 years (10.0 ttl pk-yrs)    Types: Cigarettes    Start date: 06/28/1976    Quit date: 06/28/1986    Years since quitting: 36.5   Smokeless tobacco: Never   Tobacco comments:    quit 40+ yrs ago, 1 PPD for a few years  Vaping Use   Vaping status: Never Used  Substance and Sexual Activity   Alcohol use: No   Drug use: No   Sexual activity: Not on file  Other Topics Concern   Not on file  Social History Narrative   Lives with her husband   Right handed   Caffeine: very minimal    Social Determinants of Health   Financial Resource Strain: Low Risk  (02/09/2022)   Overall Financial Resource Strain (CARDIA)    Difficulty of Paying Living Expenses: Not hard at all  Food Insecurity: No Food Insecurity (10/08/2022)   Hunger Vital Sign    Worried About Running Out of Food in the Last Year: Never true    Ran Out of Food in the Last Year: Never true  Transportation Needs: No Transportation Needs (10/08/2022)   PRAPARE - Administrator, Civil Service (Medical): No    Lack of Transportation (Non-Medical): No  Physical Activity: Inactive (02/09/2022)   Exercise Vital Sign    Days of Exercise per Week: 0 days    Minutes of Exercise per Session: 0 min  Stress: No Stress Concern Present (02/09/2022)   [employer] of  Occupational Health - Occupational Stress Questionnaire    Feeling of Stress : Not at all  Social Connections: Moderately Isolated (02/09/2022)   Social Connection and Isolation Panel [NHANES]  Frequency of Communication with Friends and Family: More than three times a week    Frequency of Social Gatherings with Friends and Family: More than three times a week    Attends Religious Services: Never    Database administrator or Organizations: No    Attends Engineer, structural: Never    Marital Status: Married    Her Allergies Are:  Allergies  Allergen Reactions   Lipitor [Atorvastatin] Other (See Comments)    Memory issues   Requip [Ropinirole Hcl] Other (See Comments)    Pt reports feeling generally unwell on this medication  :   Her Current Medications Are:  Outpatient Encounter Medications as of 01/13/2023  Medication Sig   albuterol (VENTOLIN HFA) 108 (90 Base) MCG/ACT inhaler Inhale 1-2 puffs into the lungs every 6 (six) hours as needed for wheezing or shortness of breath.   amitriptyline (ELAVIL) 50 MG tablet Take 1 tablet (50 mg total) by mouth at bedtime as needed for sleep. And neuropathy   apixaban (ELIQUIS) 5 MG TABS tablet Take 1 tablet (5 mg total) by mouth 2 (two) times daily.   Cholecalciferol (VITAMIN D-3) 25 MCG (1000 UT) CAPS Take 1 capsule by mouth daily.   cyanocobalamin (VITAMIN B12) 1000 MCG/ML injection INJECT 1 ML INTO MUSCLE EVERY 30 DAYS   diltiazem (CARDIZEM SR) 90 MG 12 hr capsule Take 1 capsule (90 mg total) by mouth 2 (two) times daily.   docusate sodium (COLACE) 100 MG capsule Take 1 capsule (100 mg total) by mouth 2 (two) times daily. (Patient taking differently: Take 100 mg by mouth daily.)   LASIX 20 MG tablet daily. Taking extra 1/2 tab for swelling   pantoprazole (PROTONIX) 40 MG tablet TAKE 1 TABLET BY MOUTH EVERY DAY   predniSONE (DELTASONE) 10 MG tablet Take 4 tablets (40 mg total) by mouth daily with breakfast for 14 days, THEN 3 tablets  (30 mg total) daily with breakfast for 14 days, THEN 2 tablets (20 mg total) daily with breakfast for 14 days.   Respiratory Therapy Supplies (FLUTTER) DEVI Use as directed   sotalol (BETAPACE) 80 MG tablet Take 0.5 tablets (40 mg total) by mouth 2 (two) times daily.   triamcinolone cream (KENALOG) 0.5 % Apply 1 Application topically 2 (two) times daily as needed. (Patient taking differently: Apply 1 Application topically 2 (two) times daily as needed (for itching).)   sertraline (ZOLOFT) 25 MG tablet Take 1 tablet (25 mg total) by mouth daily.   [DISCONTINUED] predniSONE (DELTASONE) 10 MG tablet Take 2 tablets (20 mg total) by mouth daily with breakfast. (Patient not taking: Reported on 01/06/2023)   No facility-administered encounter medications on file as of 01/13/2023.  :  Review of Systems:  Out of a complete 14 point review of systems, all are reviewed and negative with the exception of these symptoms as listed below:   Review of Systems  Neurological:        Patient is here with son and daughter in law. Pt's son states she is not better but has declined. The patient is confused all of the time, memories come and go. She cannot finish a sentence and remember what she was going to say, most of the time. Her blood sugar is high. She has been on Prednisone for pneumonia. She lives in a home with her husband and son states the husband can't take care of her. He states its approaching the time they need to do something. She had PT coming to  the home but has not progressed. She fell twice, maybe 3 times since the last appointment. MMSE 20/30, Animals x 7.    Objective:  Neurological Exam  Physical Exam Physical Examination:   Vitals:   01/13/23 1245  BP: (!) 158/76  Pulse: 71   General Examination: The patient is a very pleasant 82 y.o. female in no acute distress. She appears frail and chronically ill. In a WC.  Oxygen per portable concentrator at 3 L/min via nasal cannula.  HEENT:  Normocephalic, atraumatic, pupils are equal, round and reactive to light, extraocular tracking is fairly well-preserved, face is symmetric, speech is scant without obvious dysarthria, airway examination reveals moderate mouth dryness, adequate dental hygiene, tongue protrudes centrally and palate elevates symmetrically.  No carotid bruits.     Chest: Coarse crackles noted, distant breath sounds.     Heart: S1+S2+0, mildly irregular.      Abdomen: Soft, non-tender and non-distended.   Extremities: There is 1-2+ edema both distal lower extremities.     Skin: Warm and dry without trophic changes noted.    Musculoskeletal: exam reveals no obvious joint deformities.    Neurologically:  Mental status: The patient is awake, alert and oriented to situation, self and place.  She is able to answer simple questions quite appropriately, details of her history are provided by her daughter.      01/13/2023   12:49 PM  MMSE - Mini Mental State Exam  Orientation to time 3  Orientation to Place 5  Registration 3  Attention/ Calculation 1  Recall 2  Language- name 2 objects 1  Language- repeat 1  Language- follow 3 step command 3  Language- read & follow direction 1  Write a sentence 0  Copy design 0  Total score 20    On 01/13/2023: CDT: 1.5/4, AFT: 7/min. Thought process is linear. Mood is normal and affect is normal.  Cranial nerves II - XII are as described above under HEENT exam.  Motor exam: Thin bulk, global strength of 4 out of 5, no obvious action or resting tremor. Fine motor skills and coordination globally impaired. Cerebellar testing: No obv.dysmetria or intention tremor. There is no truncal ataxia.  I did not have her walk or stand for me, she is in a wheelchair. Sensory exam: intact to light touch in the upper and lower extremities.    Assessment and Plan:  In summary, Kathy [name] is a very pleasant 82 year old female with an underlying complex medical history of chronic kidney  disease, arthritis, endometrial cancer, hypertension, hyperlipidemia, reflux disease, interstitial lung disease, chronic respiratory failure with oxygen dependence, neuropathy, sleep apnea (not currently on PAP therapy), arthritis, hospitalization for pneumonia, UTI and sepsis, atrial fibrillation, status post cardioversion in February 2024, and severe obesity with a BMI of over 40, who presents for follow-up consultation of her memory loss.  Memory scores are moderately abnormal today.  Memory loss likely secondary to chronic medical illness, particularly interstitial lung disease with oxygen dependence.  She also has multiple vascular risk factors.  She has had acute illnesses with exacerbation of pneumonia or lung disease, currently on prednisone again.  She has had some hallucinations, worsening memory, is on vitamin B12 supplementation again per daughter-in-law.  She has a tendency to under hydrate.  We talked about the importance of supportive treatments including fall prevention.  They are looking into transitioning her to a primary care that can make house calls, there has been a discussion with pulmonology about transitioning her to palliative  care even.  Unfortunately, there is not a whole lot I can offer, we can certainly trial her on memantine for memory loss, low-dose at 5 mg twice daily for the first month and then transition to 10 mg twice daily if possible.  She has had bradycardia before, I would like to avoid Aricept.  We talked about expectations, common side effects, and limitations of memory medications.  She is advised to increase her water intake and use her walker at all times.   She is advised to follow-up to see one of our nurse practitioners in about 4 months, sooner if needed.  I answered all their questions today and they were in agreement.  I spent 40 minutes in total face-to-face time and in reviewing records during pre-charting, more than 50% of which was spent in counseling and  coordination of care, reviewing test results, reviewing medications and treatment regimen and/or in discussing or reviewing the diagnosis of memory loss, the prognosis and treatment options. Pertinent laboratory and imaging test results that were available during this visit with the patient were reviewed by me and considered in my medical decision making (see chart for details).

## 2023-01-14 DIAGNOSIS — R413 Other amnesia: Secondary | ICD-10-CM | POA: Diagnosis not present

## 2023-01-14 DIAGNOSIS — F323 Major depressive disorder, single episode, severe with psychotic features: Secondary | ICD-10-CM | POA: Diagnosis not present

## 2023-01-14 DIAGNOSIS — I5020 Unspecified systolic (congestive) heart failure: Secondary | ICD-10-CM | POA: Diagnosis not present

## 2023-01-14 DIAGNOSIS — R0609 Other forms of dyspnea: Secondary | ICD-10-CM | POA: Diagnosis not present

## 2023-01-14 DIAGNOSIS — N183 Chronic kidney disease, stage 3 unspecified: Secondary | ICD-10-CM | POA: Diagnosis not present

## 2023-01-14 DIAGNOSIS — E785 Hyperlipidemia, unspecified: Secondary | ICD-10-CM | POA: Diagnosis not present

## 2023-01-14 DIAGNOSIS — Z6841 Body Mass Index (BMI) 40.0 and over, adult: Secondary | ICD-10-CM | POA: Diagnosis not present

## 2023-01-14 DIAGNOSIS — I4891 Unspecified atrial fibrillation: Secondary | ICD-10-CM | POA: Diagnosis not present

## 2023-01-14 DIAGNOSIS — Z8701 Personal history of pneumonia (recurrent): Secondary | ICD-10-CM | POA: Diagnosis not present

## 2023-01-14 DIAGNOSIS — I130 Hypertensive heart and chronic kidney disease with heart failure and stage 1 through stage 4 chronic kidney disease, or unspecified chronic kidney disease: Secondary | ICD-10-CM | POA: Diagnosis not present

## 2023-01-14 DIAGNOSIS — Z9981 Dependence on supplemental oxygen: Secondary | ICD-10-CM | POA: Diagnosis not present

## 2023-01-14 MED ORDER — METFORMIN HCL 500 MG PO TABS: 500.0000 mg | ORAL_TABLET | Freq: Every day | ORAL | 0 refills | Status: DC

## 2023-01-14 NOTE — Telephone Encounter (Signed)
I called and spoke with the daughter in law We will reduce the prednisone to 20 mg/day. And start metformin 500 mg/day Continue to monitor blood sugars

## 2023-01-19 ENCOUNTER — Telehealth: Payer: Self-pay | Admitting: Internal Medicine

## 2023-01-19 DIAGNOSIS — M1990 Unspecified osteoarthritis, unspecified site: Secondary | ICD-10-CM | POA: Diagnosis not present

## 2023-01-19 DIAGNOSIS — J849 Interstitial pulmonary disease, unspecified: Secondary | ICD-10-CM | POA: Diagnosis not present

## 2023-01-19 DIAGNOSIS — G629 Polyneuropathy, unspecified: Secondary | ICD-10-CM | POA: Diagnosis not present

## 2023-01-19 DIAGNOSIS — N1831 Chronic kidney disease, stage 3a: Secondary | ICD-10-CM | POA: Diagnosis not present

## 2023-01-19 DIAGNOSIS — I129 Hypertensive chronic kidney disease with stage 1 through stage 4 chronic kidney disease, or unspecified chronic kidney disease: Secondary | ICD-10-CM | POA: Diagnosis not present

## 2023-01-19 DIAGNOSIS — G9341 Metabolic encephalopathy: Secondary | ICD-10-CM | POA: Diagnosis not present

## 2023-01-19 NOTE — Telephone Encounter (Signed)
Kathy [name] with [facility] called. Number:  [phone]  Patient's blood pressure is running high 150-160 over 90-110.

## 2023-01-20 NOTE — Telephone Encounter (Signed)
Ok this is noted, thanks

## 2023-01-20 NOTE — Telephone Encounter (Signed)
Needs OV at pt convenience

## 2023-01-20 NOTE — Telephone Encounter (Signed)
Called and spoke with Pt daughter she states the Pt will no longer be a Pt of Dr.John effective 01/26/23 , states they are transitioning to full time home health. She didn't see a reason for the Pt to come in for a visit.

## 2023-01-21 DIAGNOSIS — J849 Interstitial pulmonary disease, unspecified: Secondary | ICD-10-CM | POA: Diagnosis not present

## 2023-01-21 DIAGNOSIS — M170 Bilateral primary osteoarthritis of knee: Secondary | ICD-10-CM | POA: Diagnosis not present

## 2023-01-21 DIAGNOSIS — G9341 Metabolic encephalopathy: Secondary | ICD-10-CM | POA: Diagnosis not present

## 2023-01-21 DIAGNOSIS — I129 Hypertensive chronic kidney disease with stage 1 through stage 4 chronic kidney disease, or unspecified chronic kidney disease: Secondary | ICD-10-CM | POA: Diagnosis not present

## 2023-01-21 DIAGNOSIS — I4891 Unspecified atrial fibrillation: Secondary | ICD-10-CM | POA: Diagnosis not present

## 2023-01-21 DIAGNOSIS — E7800 Pure hypercholesterolemia, unspecified: Secondary | ICD-10-CM | POA: Diagnosis not present

## 2023-01-21 DIAGNOSIS — N1831 Chronic kidney disease, stage 3a: Secondary | ICD-10-CM | POA: Diagnosis not present

## 2023-01-21 DIAGNOSIS — I110 Hypertensive heart disease with heart failure: Secondary | ICD-10-CM | POA: Diagnosis not present

## 2023-01-21 DIAGNOSIS — G629 Polyneuropathy, unspecified: Secondary | ICD-10-CM | POA: Diagnosis not present

## 2023-01-21 DIAGNOSIS — M1990 Unspecified osteoarthritis, unspecified site: Secondary | ICD-10-CM | POA: Diagnosis not present

## 2023-01-21 DIAGNOSIS — I5022 Chronic systolic (congestive) heart failure: Secondary | ICD-10-CM | POA: Diagnosis not present

## 2023-01-24 ENCOUNTER — Other Ambulatory Visit: Payer: Self-pay

## 2023-01-24 ENCOUNTER — Other Ambulatory Visit: Payer: Self-pay | Admitting: Internal Medicine

## 2023-01-24 ENCOUNTER — Telehealth: Payer: Self-pay | Admitting: Internal Medicine

## 2023-01-24 NOTE — Telephone Encounter (Signed)
Ok for verbal 

## 2023-01-24 NOTE — Telephone Encounter (Signed)
Called Ms. Eggers no answer LMOM w/MD response.Marland KitchenRaechel Chute

## 2023-01-24 NOTE — Telephone Encounter (Signed)
[name] With [facility] wanting verbals for a Hospice evaluation.   Best call back number [phone] Kathy [name] direct cell phone you can call anytime.

## 2023-01-25 DIAGNOSIS — F32A Depression, unspecified: Secondary | ICD-10-CM | POA: Diagnosis not present

## 2023-01-25 DIAGNOSIS — I272 Pulmonary hypertension, unspecified: Secondary | ICD-10-CM | POA: Diagnosis not present

## 2023-01-25 DIAGNOSIS — J9611 Chronic respiratory failure with hypoxia: Secondary | ICD-10-CM | POA: Diagnosis not present

## 2023-01-25 DIAGNOSIS — N184 Chronic kidney disease, stage 4 (severe): Secondary | ICD-10-CM | POA: Diagnosis not present

## 2023-01-25 DIAGNOSIS — J479 Bronchiectasis, uncomplicated: Secondary | ICD-10-CM | POA: Diagnosis not present

## 2023-01-25 DIAGNOSIS — J449 Chronic obstructive pulmonary disease, unspecified: Secondary | ICD-10-CM | POA: Diagnosis not present

## 2023-01-25 DIAGNOSIS — I509 Heart failure, unspecified: Secondary | ICD-10-CM | POA: Diagnosis not present

## 2023-01-25 DIAGNOSIS — I2723 Pulmonary hypertension due to lung diseases and hypoxia: Secondary | ICD-10-CM | POA: Diagnosis not present

## 2023-01-25 DIAGNOSIS — F03B3 Unspecified dementia, moderate, with mood disturbance: Secondary | ICD-10-CM | POA: Diagnosis not present

## 2023-01-27 DIAGNOSIS — I509 Heart failure, unspecified: Secondary | ICD-10-CM | POA: Diagnosis not present

## 2023-01-27 DIAGNOSIS — I2723 Pulmonary hypertension due to lung diseases and hypoxia: Secondary | ICD-10-CM | POA: Diagnosis not present

## 2023-01-27 DIAGNOSIS — I272 Pulmonary hypertension, unspecified: Secondary | ICD-10-CM | POA: Diagnosis not present

## 2023-01-27 DIAGNOSIS — J9611 Chronic respiratory failure with hypoxia: Secondary | ICD-10-CM | POA: Diagnosis not present

## 2023-01-27 DIAGNOSIS — F03B3 Unspecified dementia, moderate, with mood disturbance: Secondary | ICD-10-CM | POA: Diagnosis not present

## 2023-01-27 DIAGNOSIS — N184 Chronic kidney disease, stage 4 (severe): Secondary | ICD-10-CM | POA: Diagnosis not present

## 2023-01-27 DIAGNOSIS — F32A Depression, unspecified: Secondary | ICD-10-CM | POA: Diagnosis not present

## 2023-01-27 DIAGNOSIS — J449 Chronic obstructive pulmonary disease, unspecified: Secondary | ICD-10-CM | POA: Diagnosis not present

## 2023-02-02 DIAGNOSIS — J9611 Chronic respiratory failure with hypoxia: Secondary | ICD-10-CM | POA: Diagnosis not present

## 2023-02-02 DIAGNOSIS — I509 Heart failure, unspecified: Secondary | ICD-10-CM | POA: Diagnosis not present

## 2023-02-02 DIAGNOSIS — N184 Chronic kidney disease, stage 4 (severe): Secondary | ICD-10-CM | POA: Diagnosis not present

## 2023-02-02 DIAGNOSIS — I2723 Pulmonary hypertension due to lung diseases and hypoxia: Secondary | ICD-10-CM | POA: Diagnosis not present

## 2023-02-02 DIAGNOSIS — J449 Chronic obstructive pulmonary disease, unspecified: Secondary | ICD-10-CM | POA: Diagnosis not present

## 2023-02-02 DIAGNOSIS — I272 Pulmonary hypertension, unspecified: Secondary | ICD-10-CM | POA: Diagnosis not present

## 2023-02-04 DIAGNOSIS — I2723 Pulmonary hypertension due to lung diseases and hypoxia: Secondary | ICD-10-CM | POA: Diagnosis not present

## 2023-02-04 DIAGNOSIS — I509 Heart failure, unspecified: Secondary | ICD-10-CM | POA: Diagnosis not present

## 2023-02-04 DIAGNOSIS — I272 Pulmonary hypertension, unspecified: Secondary | ICD-10-CM | POA: Diagnosis not present

## 2023-02-04 DIAGNOSIS — J9611 Chronic respiratory failure with hypoxia: Secondary | ICD-10-CM | POA: Diagnosis not present

## 2023-02-04 DIAGNOSIS — J449 Chronic obstructive pulmonary disease, unspecified: Secondary | ICD-10-CM | POA: Diagnosis not present

## 2023-02-04 DIAGNOSIS — N184 Chronic kidney disease, stage 4 (severe): Secondary | ICD-10-CM | POA: Diagnosis not present

## 2023-02-08 DIAGNOSIS — I2723 Pulmonary hypertension due to lung diseases and hypoxia: Secondary | ICD-10-CM | POA: Diagnosis not present

## 2023-02-08 DIAGNOSIS — J9611 Chronic respiratory failure with hypoxia: Secondary | ICD-10-CM | POA: Diagnosis not present

## 2023-02-08 DIAGNOSIS — I272 Pulmonary hypertension, unspecified: Secondary | ICD-10-CM | POA: Diagnosis not present

## 2023-02-08 DIAGNOSIS — J449 Chronic obstructive pulmonary disease, unspecified: Secondary | ICD-10-CM | POA: Diagnosis not present

## 2023-02-08 DIAGNOSIS — N184 Chronic kidney disease, stage 4 (severe): Secondary | ICD-10-CM | POA: Diagnosis not present

## 2023-02-08 DIAGNOSIS — I509 Heart failure, unspecified: Secondary | ICD-10-CM | POA: Diagnosis not present

## 2023-02-09 DIAGNOSIS — J9611 Chronic respiratory failure with hypoxia: Secondary | ICD-10-CM | POA: Diagnosis not present

## 2023-02-09 DIAGNOSIS — N184 Chronic kidney disease, stage 4 (severe): Secondary | ICD-10-CM | POA: Diagnosis not present

## 2023-02-09 DIAGNOSIS — J449 Chronic obstructive pulmonary disease, unspecified: Secondary | ICD-10-CM | POA: Diagnosis not present

## 2023-02-09 DIAGNOSIS — I272 Pulmonary hypertension, unspecified: Secondary | ICD-10-CM | POA: Diagnosis not present

## 2023-02-09 DIAGNOSIS — I2723 Pulmonary hypertension due to lung diseases and hypoxia: Secondary | ICD-10-CM | POA: Diagnosis not present

## 2023-02-09 DIAGNOSIS — I509 Heart failure, unspecified: Secondary | ICD-10-CM | POA: Diagnosis not present

## 2023-02-10 ENCOUNTER — Telehealth: Payer: Self-pay

## 2023-02-10 DIAGNOSIS — J449 Chronic obstructive pulmonary disease, unspecified: Secondary | ICD-10-CM | POA: Diagnosis not present

## 2023-02-10 DIAGNOSIS — I272 Pulmonary hypertension, unspecified: Secondary | ICD-10-CM | POA: Diagnosis not present

## 2023-02-10 DIAGNOSIS — I509 Heart failure, unspecified: Secondary | ICD-10-CM | POA: Diagnosis not present

## 2023-02-10 DIAGNOSIS — J9611 Chronic respiratory failure with hypoxia: Secondary | ICD-10-CM | POA: Diagnosis not present

## 2023-02-10 DIAGNOSIS — N184 Chronic kidney disease, stage 4 (severe): Secondary | ICD-10-CM | POA: Diagnosis not present

## 2023-02-10 DIAGNOSIS — I2723 Pulmonary hypertension due to lung diseases and hypoxia: Secondary | ICD-10-CM | POA: Diagnosis not present

## 2023-02-10 NOTE — Telephone Encounter (Signed)
[facility], forms fax( result ok)

## 2023-02-11 DIAGNOSIS — N184 Chronic kidney disease, stage 4 (severe): Secondary | ICD-10-CM | POA: Diagnosis not present

## 2023-02-11 DIAGNOSIS — I509 Heart failure, unspecified: Secondary | ICD-10-CM | POA: Diagnosis not present

## 2023-02-11 DIAGNOSIS — J449 Chronic obstructive pulmonary disease, unspecified: Secondary | ICD-10-CM | POA: Diagnosis not present

## 2023-02-11 DIAGNOSIS — I2723 Pulmonary hypertension due to lung diseases and hypoxia: Secondary | ICD-10-CM | POA: Diagnosis not present

## 2023-02-11 DIAGNOSIS — I272 Pulmonary hypertension, unspecified: Secondary | ICD-10-CM | POA: Diagnosis not present

## 2023-02-11 DIAGNOSIS — J9611 Chronic respiratory failure with hypoxia: Secondary | ICD-10-CM | POA: Diagnosis not present

## 2023-02-15 ENCOUNTER — Ambulatory Visit (INDEPENDENT_AMBULATORY_CARE_PROVIDER_SITE_OTHER): Payer: Medicare Other

## 2023-02-15 VITALS — Ht 60.0 in | Wt 213.0 lb

## 2023-02-15 DIAGNOSIS — J9611 Chronic respiratory failure with hypoxia: Secondary | ICD-10-CM | POA: Diagnosis not present

## 2023-02-15 DIAGNOSIS — I2723 Pulmonary hypertension due to lung diseases and hypoxia: Secondary | ICD-10-CM | POA: Diagnosis not present

## 2023-02-15 DIAGNOSIS — N184 Chronic kidney disease, stage 4 (severe): Secondary | ICD-10-CM | POA: Diagnosis not present

## 2023-02-15 DIAGNOSIS — Z0000 Encounter for general adult medical examination without abnormal findings: Secondary | ICD-10-CM | POA: Diagnosis not present

## 2023-02-15 DIAGNOSIS — I272 Pulmonary hypertension, unspecified: Secondary | ICD-10-CM | POA: Diagnosis not present

## 2023-02-15 DIAGNOSIS — I509 Heart failure, unspecified: Secondary | ICD-10-CM | POA: Diagnosis not present

## 2023-02-15 DIAGNOSIS — J449 Chronic obstructive pulmonary disease, unspecified: Secondary | ICD-10-CM | POA: Diagnosis not present

## 2023-02-15 NOTE — Progress Notes (Signed)
Subjective:   Kathy [name] is a 82 y.o. female who presents for Medicare Annual (Subsequent) preventive examination.  Visit Complete: Virtual  I connected with  Kathy [name] on 02/15/23 by a audio enabled telemedicine application and verified that I am speaking with the correct person using two identifiers.  Patient Location: Home  Provider Location: Home Office  I discussed the limitations of evaluation and management by telemedicine. The patient expressed understanding and agreed to proceed.  Vital Signs: Because this visit was a virtual/telehealth visit, some criteria may be missing or patient reported. Any vitals not documented were not able to be obtained and vitals that have been documented are patient reported.    Patient was not able to do her 6CIT due to her condition.   Review of Systems    Cardiac Risk Factors include: advanced age (>27men, >35 women);hypertension;dyslipidemia;Other (see comment), Risk factor comments: Arotic Atherosclerosis, A-Fib, OSA, CKD, ILD     Objective:    Today's Vitals   02/15/23 1105  Weight: 213 lb (96.6 kg)  Height: 5' (1.524 m)   Body mass index is 41.6 kg/m.     02/15/2023   11:24 AM 01/03/2023    4:36 PM 10/14/2022    1:58 PM 08/13/2022    3:40 PM 02/09/2022    1:17 PM 12/02/2021   10:47 AM 12/01/2021    9:06 PM  Advanced Directives  Does Patient Have a Medical Advance Directive? Yes No No No No No Unable to assess, patient is non-responsive or altered mental status  Type of Estate agent of [place];Living will        Does patient want to make changes to medical advance directive? No - Patient declined        Copy of Healthcare Power of Attorney in Chart? No - copy requested        Would patient like information on creating a medical advance directive?   No - Patient declined No - Patient declined No - Patient declined No - Patient declined     Current Medications (verified) Outpatient Encounter Medications as of  02/15/2023  Medication Sig   albuterol (VENTOLIN HFA) 108 (90 Base) MCG/ACT inhaler Inhale 1-2 puffs into the lungs every 6 (six) hours as needed for wheezing or shortness of breath.   amitriptyline (ELAVIL) 50 MG tablet Take 1 tablet (50 mg total) by mouth at bedtime as needed for sleep. And neuropathy   apixaban (ELIQUIS) 5 MG TABS tablet Take 1 tablet (5 mg total) by mouth 2 (two) times daily.   Cholecalciferol (VITAMIN D-3) 25 MCG (1000 UT) CAPS Take 1 capsule by mouth daily.   cyanocobalamin (VITAMIN B12) 1000 MCG/ML injection INJECT 1 ML INTO MUSCLE EVERY 30 DAYS   diltiazem (CARDIZEM SR) 90 MG 12 hr capsule Take 1 capsule (90 mg total) by mouth 2 (two) times daily.   docusate sodium (COLACE) 100 MG capsule Take 1 capsule (100 mg total) by mouth 2 (two) times daily. (Patient taking differently: Take 100 mg by mouth daily.)   glimepiride (AMARYL) 2 MG tablet Take 2 mg by mouth daily.   LASIX 20 MG tablet daily. Taking extra 1/2 tab for swelling   memantine (NAMENDA) 10 MG tablet Take 1 tablet (10 mg total) by mouth 2 (two) times daily. This is the final dose, follow instructions provided verbally and in after visit summary (AVS in MyChart).   pantoprazole (PROTONIX) 40 MG tablet TAKE 1 TABLET BY MOUTH EVERY DAY   predniSONE (DELTASONE) 10  MG tablet Take 4 tablets (40 mg total) by mouth daily with breakfast for 14 days, THEN 3 tablets (30 mg total) daily with breakfast for 14 days, THEN 2 tablets (20 mg total) daily with breakfast for 14 days.   Respiratory Therapy Supplies (FLUTTER) DEVI Use as directed   sertraline (ZOLOFT) 25 MG tablet TAKE 1 TABLET (25 MG TOTAL) BY MOUTH DAILY.   sotalol (BETAPACE) 80 MG tablet Take 0.5 tablets (40 mg total) by mouth 2 (two) times daily.   triamcinolone cream (KENALOG) 0.5 % Apply 1 Application topically 2 (two) times daily as needed. (Patient taking differently: Apply 1 Application topically 2 (two) times daily as needed (for itching).)   metFORMIN  (GLUCOPHAGE) 500 MG tablet Take 1 tablet (500 mg total) by mouth daily with breakfast. (Patient not taking: Reported on 02/15/2023)   No facility-administered encounter medications on file as of 02/15/2023.    Allergies (verified) Lipitor [atorvastatin] and Requip [ropinirole hcl]   History: Past Medical History:  Diagnosis Date   Arthritis    fingers   CKD (chronic kidney disease) stage 3, GFR 30-59 ml/min (HCC) 11/30/2017   Depression    Dizziness    in AM, getting out of bed   Dysrhythmia    A fib   Endometrial ca (HCC) 11/30/2017   S/p surgury 1990's   Fall    GERD (gastroesophageal reflux disease) 11/30/2017   HLD (hyperlipidemia) 11/30/2017   Hypercholesteremia    Hypertension    Interstitial lung disease (HCC)    Neuropathy    bilateral feet   Pneumonia 12/14/2021   Shortness of breath dyspnea    Sleep apnea    has CPAP, doesn't use   Umbilical hernia    Past Surgical History:  Procedure Laterality Date   ABDOMINAL HYSTERECTOMY     BROW LIFT Bilateral 07/15/2015   Procedure: BLEPHAROPLASTY;  Surgeon: Imagene Riches, MD;  Location: [facility];  Service: Ophthalmology;  Laterality: Bilateral;   BUBBLE STUDY  08/18/2022   Procedure: BUBBLE STUDY;  Surgeon: Tessa Lerner, DO;  Location: [facility];  Service: Cardiovascular;;   CARDIOVERSION N/A 08/18/2022   Procedure: CARDIOVERSION;  Surgeon: Tessa Lerner, DO;  Location: [facility];  Service: Cardiovascular;  Laterality: N/A;   CATARACT EXTRACTION W/PHACO Right 12/31/2021   Procedure: CATARACT EXTRACTION PHACO AND INTRAOCULAR LENS PLACEMENT (IOC) RIGHT;  Surgeon: Estanislado Pandy, MD;  Location: [facility];  Service: Ophthalmology;  Laterality: Right;  17.11 1:46.1   CHOLECYSTECTOMY     HAMMER TOE SURGERY     HERNIA REPAIR     KNEE ARTHROSCOPY Bilateral    PTOSIS REPAIR Bilateral 07/15/2015   Procedure: PTOSIS REPAIR;  Surgeon: Imagene Riches, MD;  Location: [facility];  Service:  Ophthalmology;  Laterality: Bilateral;  CPAP   TEE WITHOUT CARDIOVERSION N/A 08/18/2022   Procedure: TRANSESOPHAGEAL ECHOCARDIOGRAM (TEE);  Surgeon: Tessa Lerner, DO;  Location: [facility];  Service: Cardiovascular;  Laterality: N/A;   TONSILLECTOMY     Family History  Problem Relation Age of Onset   Congestive Heart Failure Mother    Stroke Father    Parkinson's disease Father    Diabetes Son    Social History   Socioeconomic History   Marital status: Married    Spouse name: [name]   Number of children: 3   Years of education: Not on file   Highest education level: Associate degree: occupational, Scientist, product/process development, or vocational program  Occupational History   Occupation: Retired  Tobacco Use  Smoking status: Former    Current packs/day: 0.00    Average packs/day: 1 pack/day for 10.0 years (10.0 ttl pk-yrs)    Types: Cigarettes    Start date: 06/28/1976    Quit date: 06/28/1986    Years since quitting: 36.6   Smokeless tobacco: Never   Tobacco comments:    quit 40+ yrs ago, 1 PPD for a few years  Vaping Use   Vaping status: Never Used  Substance and Sexual Activity   Alcohol use: No   Drug use: No   Sexual activity: Not on file  Other Topics Concern   Not on file  Social History Narrative   Lives with her husband   Right handed   Caffeine: very minimal    Social Determinants of Health   Financial Resource Strain: Low Risk  (02/15/2023)   Overall Financial Resource Strain (CARDIA)    Difficulty of Paying Living Expenses: Not hard at all  Food Insecurity: No Food Insecurity (02/15/2023)   Hunger Vital Sign    Worried About Running Out of Food in the Last Year: Never true    Ran Out of Food in the Last Year: Never true  Transportation Needs: No Transportation Needs (02/15/2023)   PRAPARE - Administrator, Civil Service (Medical): No    Lack of Transportation (Non-Medical): No  Physical Activity: Inactive (02/15/2023)   Exercise Vital Sign    Days of Exercise per  Week: 0 days    Minutes of Exercise per Session: 0 min  Stress: Stress Concern Present (02/15/2023)   [employer] of Occupational Health - Occupational Stress Questionnaire    Feeling of Stress : To some extent  Social Connections: Moderately Isolated (02/15/2023)   Social Connection and Isolation Panel [NHANES]    Frequency of Communication with Friends and Family: More than three times a week    Frequency of Social Gatherings with Friends and Family: More than three times a week    Attends Religious Services: Never    Database administrator or Organizations: No    Attends Engineer, structural: Never    Marital Status: Married    Tobacco Counseling Counseling given: Not Answered Tobacco comments: quit 40+ yrs ago, 1 PPD for a few years   Clinical Intake:  Pre-visit preparation completed: Yes  Pain : No/denies pain     BMI - recorded: 41.6 Nutritional Risks: None Diabetes: No  How often do you need to have someone help you when you read instructions, pamphlets, or other written materials from your doctor or pharmacy?: 5 - Always  Interpreter Needed?: No  Information entered by :: Jeannett Dekoning, RMA   Activities of Daily Living    02/15/2023   11:21 AM 08/14/2022    4:00 PM  In your present state of health, do you have any difficulty performing the following activities:  Hearing? 1 1  Vision? 0 0  Difficulty concentrating or making decisions? 1 1  Walking or climbing stairs? 1 1  Dressing or bathing? 1 1  Doing errands, shopping? 1 1  Preparing Food and eating ? N   Using the Toilet? N   In the past six months, have you accidently leaked urine? Y   Comment weras depends   Do you have problems with loss of bowel control? N   Managing your Medications? Y   Managing your Finances? Y   Housekeeping or managing your Housekeeping? Y     Patient Care Team: Corwin Levins, MD  as PCP - General (Internal Medicine) Estanislado Pandy, MD as Consulting  Physician (Ophthalmology)  Indicate any recent Medical Services you may have received from other than [facility] providers in the past year (date may be approximate).     Assessment:   This is a routine wellness examination for [facility].  Hearing/Vision screen Hearing Screening - Comments:: Some difficulty Vision Screening - Comments:: Wears eyeglasses  Dietary issues and exercise activities discussed:     Goals Addressed   None   Depression Screen    02/15/2023   11:28 AM 12/28/2022    2:04 PM 12/28/2022    2:01 PM 06/29/2022    3:22 PM 02/09/2022    1:58 PM 02/09/2022    1:35 PM 12/11/2021    1:53 PM  PHQ 2/9 Scores  PHQ - 2 Score 4 0 0 0  1 3  PHQ- 9 Score 10 0  0   18  Exception Documentation     Patient refusal      Fall Risk    02/15/2023   11:25 AM 12/28/2022    2:01 PM 06/29/2022    3:22 PM 02/09/2022    1:58 PM 12/11/2021    1:52 PM  Fall Risk   Falls in the past year? 1 1 0 0 1  Number falls in past yr: 1 1  0 1  Injury with Fall? 0 0 0 0 0  Risk for fall due to : Impaired mobility;Impaired balance/gait History of fall(s) Other (Comment) Other (Comment)   Follow up Falls evaluation completed;Falls prevention discussed Falls evaluation completed Falls evaluation completed Falls evaluation completed     MEDICARE RISK AT HOME: Medicare Risk at Home Any stairs in or around the home?: No Home free of loose throw rugs in walkways, pet beds, electrical cords, etc?: Yes Adequate lighting in your home to reduce risk of falls?: Yes Life alert?: No Use of a cane, walker or w/c?: Yes (w/c) Grab bars in the bathroom?: Yes Shower chair or bench in shower?: Yes Elevated toilet seat or a handicapped toilet?: Yes  TIMED UP AND GO:  Was the test performed?  No    Cognitive Function:    01/13/2023   12:49 PM  MMSE - Mini Mental State Exam  Orientation to time 3  Orientation to Place 5  Registration 3  Attention/ Calculation 1  Recall 2  Language- name 2 objects 1  Language-  repeat 1  Language- follow 3 step command 3  Language- read & follow direction 1  Write a sentence 0  Copy design 0  Total score 20        02/15/2023   11:26 AM 02/09/2022    1:38 PM  6CIT Screen  What Year? -- 0 points  What month?  0 points  What time?  0 points  Count back from 20  0 points  Months in reverse  0 points  Repeat phrase  0 points  Total Score  0 points    Immunizations Immunization History  Administered Date(s) Administered   Fluad Quad(high Dose 65+) 06/08/2019, 04/01/2020, 05/31/2022   Influenza, High Dose Seasonal PF 04/04/2017, 03/14/2018   PFIZER(Purple Top)SARS-COV-2 Vaccination 10/04/2019, 10/25/2019, 04/24/2020   Pneumococcal Conjugate-13 11/30/2017   Pneumococcal Polysaccharide-23 06/30/2016   Tdap 11/30/2017    TDAP status: Up to date  Flu Vaccine status: Due, Education has been provided regarding the importance of this vaccine. Advised may receive this vaccine at local pharmacy or Health Dept. Aware to provide a copy  of the vaccination record if obtained from local pharmacy or Health Dept. Verbalized acceptance and understanding.  Pneumococcal vaccine status: Up to date  Covid-19 vaccine status: Declined, Education has been provided regarding the importance of this vaccine but patient still declined. Advised may receive this vaccine at local pharmacy or Health Dept.or vaccine clinic. Aware to provide a copy of the vaccination record if obtained from local pharmacy or Health Dept. Verbalized acceptance and understanding.  Qualifies for Shingles Vaccine? Yes   Zostavax completed No   Shingrix Completed?: No.    Education has been provided regarding the importance of this vaccine. Patient has been advised to call insurance company to determine out of pocket expense if they have not yet received this vaccine. Advised may also receive vaccine at local pharmacy or Health Dept. Verbalized acceptance and understanding.  Screening Tests Health  Maintenance  Topic Date Due   Zoster Vaccines- Shingrix (1 of 2) Never done   COVID-19 Vaccine (4 - 2023-24 season) 02/26/2022   INFLUENZA VACCINE  01/27/2023   Medicare Annual Wellness (AWV)  02/15/2024   DTaP/Tdap/Td (2 - Td or Tdap) 12/01/2027   Pneumonia Vaccine 16+ Years old  Completed   DEXA SCAN  Completed   HPV VACCINES  Aged Out   Hepatitis C Screening  Discontinued    Health Maintenance  Health Maintenance Due  Topic Date Due   Zoster Vaccines- Shingrix (1 of 2) Never done   COVID-19 Vaccine (4 - 2023-24 season) 02/26/2022   INFLUENZA VACCINE  01/27/2023    Colorectal cancer screening: No longer required.   Mammogram status: No longer required due to age.  Bone Density status: Completed 11/26/2014. Results reflect: Bone density results: NORMAL. Repeat every 2 years.  Lung Cancer Screening: (Low Dose CT Chest recommended if Age 8-80 years, 20 pack-year currently smoking OR have quit w/in 15years.) does not qualify.   Lung Cancer Screening Referral: N/a  Additional Screening:  Hepatitis C Screening: does qualify; Completed 06/28/2016  Vision Screening: Recommended annual ophthalmology exams for early detection of glaucoma and other disorders of the eye. Is the patient up to date with their annual eye exam?  No  Who is the provider or what is the name of the office in which the patient attends annual eye exams? N/A If pt is not established with a provider, would they like to be referred to a provider to establish care? Yes .   Dental Screening: Recommended annual dental exams for proper oral hygiene   Community Resource Referral / Chronic Care Management: CRR required this visit?  No   CCM required this visit?  No     Plan:     I have personally reviewed and noted the following in the patient's chart:   Medical and social history Use of alcohol, tobacco or illicit drugs  Current medications and supplements including opioid prescriptions. Patient is not  currently taking opioid prescriptions. Functional ability and status Nutritional status Physical activity Advanced directives List of other physicians Hospitalizations, surgeries, and ER visits in previous 12 months Vitals Screenings to include cognitive, depression, and falls Referrals and appointments  In addition, I have reviewed and discussed with patient certain preventive protocols, quality metrics, and best practice recommendations. A written personalized care plan for preventive services as well as general preventive health recommendations were provided to patient.     Brina Umeda L Naara Kelty, CMA   02/15/2023   After Visit Summary: (MyChart) Due to this being a telephonic visit, the after visit summary with  patients personalized plan was offered to patient via MyChart   Nurse Notes: I spoke with patient's POA today and she answered all question for Mrs. Shrivastava.  Patient is due for a Flu vaccine but she will no longer get Covid vaccine.  She is also due for a Shingrix vaccine but due to her condition her POA said that she will not take that vaccine also.

## 2023-02-15 NOTE — Patient Instructions (Signed)
Kathy [name] , Thank you for taking time to come for your Medicare Wellness Visit. I appreciate your ongoing commitment to your health goals. Please review the following plan we discussed and let me know if I can assist you in the future.   Referrals/Orders/Follow-Ups/Clinician Recommendations: You are due for your Flu vaccines soon.  Please notify who you see for eye care via Mychart.  Each day, aim for 6 glasses of water, plenty of protein in your diet and try to get up and walk/ stretch every hour for 5-10 minutes at a time.    This is a list of the screening recommended for you and due dates:  Health Maintenance  Topic Date Due   Zoster (Shingles) Vaccine (1 of 2) Never done   COVID-19 Vaccine (4 - 2023-24 season) 02/26/2022   Flu Shot  01/27/2023   Medicare Annual Wellness Visit  02/15/2024   DTaP/Tdap/Td vaccine (2 - Td or Tdap) 12/01/2027   Pneumonia Vaccine  Completed   DEXA scan (bone density measurement)  Completed   HPV Vaccine  Aged Out   Hepatitis C Screening  Discontinued    Advanced directives: (In Chart) A copy of your advanced directives are scanned into your chart should your provider ever need it.  Next Medicare Annual Wellness Visit scheduled for next year: Yes  Preventive Care 91 Years and Older, Female Preventive care refers to lifestyle choices and visits with your health care provider that can promote health and wellness. What does preventive care include? A yearly physical exam. This is also called an annual well check. Dental exams once or twice a year. Routine eye exams. Ask your health care provider how often you should have your eyes checked. Personal lifestyle choices, including: Daily care of your teeth and gums. Regular physical activity. Eating a healthy diet. Avoiding tobacco and drug use. Limiting alcohol use. Practicing safe sex. Taking low-dose aspirin every day. Taking vitamin and mineral supplements as recommended by your health care  provider. What happens during an annual well check? The services and screenings done by your health care provider during your annual well check will depend on your age, overall health, lifestyle risk factors, and family history of disease. Counseling  Your health care provider may ask you questions about your: Alcohol use. Tobacco use. Drug use. Emotional well-being. Home and relationship well-being. Sexual activity. Eating habits. History of falls. Memory and ability to understand (cognition). Work and work Astronomer. Reproductive health. Screening  You may have the following tests or measurements: Height, weight, and BMI. Blood pressure. Lipid and cholesterol levels. These may be checked every 5 years, or more frequently if you are over 65 years old. Skin check. Lung cancer screening. You may have this screening every year starting at age 58 if you have a 30-pack-year history of smoking and currently smoke or have quit within the past 15 years. Fecal occult blood test (FOBT) of the stool. You may have this test every year starting at age 55. Flexible sigmoidoscopy or colonoscopy. You may have a sigmoidoscopy every 5 years or a colonoscopy every 10 years starting at age 81. Hepatitis C blood test. Hepatitis B blood test. Sexually transmitted disease (STD) testing. Diabetes screening. This is done by checking your blood sugar (glucose) after you have not eaten for a while (fasting). You may have this done every 1-3 years. Bone density scan. This is done to screen for osteoporosis. You may have this done starting at age 4. Mammogram. This may be done every  1-2 years. Talk to your health care provider about how often you should have regular mammograms. Talk with your health care provider about your test results, treatment options, and if necessary, the need for more tests. Vaccines  Your health care provider may recommend certain vaccines, such as: Influenza vaccine. This is  recommended every year. Tetanus, diphtheria, and acellular pertussis (Tdap, Td) vaccine. You may need a Td booster every 10 years. Zoster vaccine. You may need this after age 30. Pneumococcal 13-valent conjugate (PCV13) vaccine. One dose is recommended after age 20. Pneumococcal polysaccharide (PPSV23) vaccine. One dose is recommended after age 92. Talk to your health care provider about which screenings and vaccines you need and how often you need them. This information is not intended to replace advice given to you by your health care provider. Make sure you discuss any questions you have with your health care provider. Document Released: 07/11/2015 Document Revised: 03/03/2016 Document Reviewed: 04/15/2015 Elsevier Interactive Patient Education  2017 [employer].  Fall Prevention in the Home Falls can cause injuries. They can happen to people of all ages. There are many things you can do to make your home safe and to help prevent falls. What can I do on the outside of my home? Regularly fix the edges of walkways and driveways and fix any cracks. Remove anything that might make you trip as you walk through a door, such as a raised step or threshold. Trim any bushes or trees on the path to your home. Use bright outdoor lighting. Clear any walking paths of anything that might make someone trip, such as rocks or tools. Regularly check to see if handrails are loose or broken. Make sure that both sides of any steps have handrails. Any raised decks and porches should have guardrails on the edges. Have any leaves, snow, or ice cleared regularly. Use sand or salt on walking paths during winter. Clean up any spills in your garage right away. This includes oil or grease spills. What can I do in the bathroom? Use night lights. Install grab bars by the toilet and in the tub and shower. Do not use towel bars as grab bars. Use non-skid mats or decals in the tub or shower. If you need to sit down in  the shower, use a plastic, non-slip stool. Keep the floor dry. Clean up any water that spills on the floor as soon as it happens. Remove soap buildup in the tub or shower regularly. Attach bath mats securely with double-sided non-slip rug tape. Do not have throw rugs and other things on the floor that can make you trip. What can I do in the bedroom? Use night lights. Make sure that you have a light by your bed that is easy to reach. Do not use any sheets or blankets that are too big for your bed. They should not hang down onto the floor. Have a firm chair that has side arms. You can use this for support while you get dressed. Do not have throw rugs and other things on the floor that can make you trip. What can I do in the kitchen? Clean up any spills right away. Avoid walking on wet floors. Keep items that you use a lot in easy-to-reach places. If you need to reach something above you, use a strong step stool that has a grab bar. Keep electrical cords out of the way. Do not use floor polish or wax that makes floors slippery. If you must use wax, use non-skid  floor wax. Do not have throw rugs and other things on the floor that can make you trip. What can I do with my stairs? Do not leave any items on the stairs. Make sure that there are handrails on both sides of the stairs and use them. Fix handrails that are broken or loose. Make sure that handrails are as long as the stairways. Check any carpeting to make sure that it is firmly attached to the stairs. Fix any carpet that is loose or worn. Avoid having throw rugs at the top or bottom of the stairs. If you do have throw rugs, attach them to the floor with carpet tape. Make sure that you have a light switch at the top of the stairs and the bottom of the stairs. If you do not have them, ask someone to add them for you. What else can I do to help prevent falls? Wear shoes that: Do not have high heels. Have rubber bottoms. Are comfortable  and fit you well. Are closed at the toe. Do not wear sandals. If you use a stepladder: Make sure that it is fully opened. Do not climb a closed stepladder. Make sure that both sides of the stepladder are locked into place. Ask someone to hold it for you, if possible. Clearly mark and make sure that you can see: Any grab bars or handrails. First and last steps. Where the edge of each step is. Use tools that help you move around (mobility aids) if they are needed. These include: Canes. Walkers. Scooters. Crutches. Turn on the lights when you go into a dark area. Replace any light bulbs as soon as they burn out. Set up your furniture so you have a clear path. Avoid moving your furniture around. If any of your floors are uneven, fix them. If there are any pets around you, be aware of where they are. Review your medicines with your doctor. Some medicines can make you feel dizzy. This can increase your chance of falling. Ask your doctor what other things that you can do to help prevent falls. This information is not intended to replace advice given to you by your health care provider. Make sure you discuss any questions you have with your health care provider. Document Released: 04/10/2009 Document Revised: 11/20/2015 Document Reviewed: 07/19/2014 Elsevier Interactive Patient Education  2017 [employer].

## 2023-02-16 ENCOUNTER — Encounter: Payer: Medicare Other | Admitting: Family Medicine

## 2023-02-17 DIAGNOSIS — J9611 Chronic respiratory failure with hypoxia: Secondary | ICD-10-CM | POA: Diagnosis not present

## 2023-02-17 DIAGNOSIS — I272 Pulmonary hypertension, unspecified: Secondary | ICD-10-CM | POA: Diagnosis not present

## 2023-02-17 DIAGNOSIS — J449 Chronic obstructive pulmonary disease, unspecified: Secondary | ICD-10-CM | POA: Diagnosis not present

## 2023-02-17 DIAGNOSIS — I509 Heart failure, unspecified: Secondary | ICD-10-CM | POA: Diagnosis not present

## 2023-02-17 DIAGNOSIS — N184 Chronic kidney disease, stage 4 (severe): Secondary | ICD-10-CM | POA: Diagnosis not present

## 2023-02-17 DIAGNOSIS — I2723 Pulmonary hypertension due to lung diseases and hypoxia: Secondary | ICD-10-CM | POA: Diagnosis not present

## 2023-02-19 ENCOUNTER — Other Ambulatory Visit: Payer: Self-pay | Admitting: Pulmonary Disease

## 2023-02-21 DIAGNOSIS — N184 Chronic kidney disease, stage 4 (severe): Secondary | ICD-10-CM | POA: Diagnosis not present

## 2023-02-21 DIAGNOSIS — I2723 Pulmonary hypertension due to lung diseases and hypoxia: Secondary | ICD-10-CM | POA: Diagnosis not present

## 2023-02-21 DIAGNOSIS — J449 Chronic obstructive pulmonary disease, unspecified: Secondary | ICD-10-CM | POA: Diagnosis not present

## 2023-02-21 DIAGNOSIS — I272 Pulmonary hypertension, unspecified: Secondary | ICD-10-CM | POA: Diagnosis not present

## 2023-02-21 DIAGNOSIS — J9611 Chronic respiratory failure with hypoxia: Secondary | ICD-10-CM | POA: Diagnosis not present

## 2023-02-21 DIAGNOSIS — I509 Heart failure, unspecified: Secondary | ICD-10-CM | POA: Diagnosis not present

## 2023-02-24 DIAGNOSIS — J9611 Chronic respiratory failure with hypoxia: Secondary | ICD-10-CM | POA: Diagnosis not present

## 2023-02-24 DIAGNOSIS — N184 Chronic kidney disease, stage 4 (severe): Secondary | ICD-10-CM | POA: Diagnosis not present

## 2023-02-24 DIAGNOSIS — J449 Chronic obstructive pulmonary disease, unspecified: Secondary | ICD-10-CM | POA: Diagnosis not present

## 2023-02-24 DIAGNOSIS — I2723 Pulmonary hypertension due to lung diseases and hypoxia: Secondary | ICD-10-CM | POA: Diagnosis not present

## 2023-02-24 DIAGNOSIS — I272 Pulmonary hypertension, unspecified: Secondary | ICD-10-CM | POA: Diagnosis not present

## 2023-02-24 DIAGNOSIS — I509 Heart failure, unspecified: Secondary | ICD-10-CM | POA: Diagnosis not present

## 2023-02-25 DIAGNOSIS — J9611 Chronic respiratory failure with hypoxia: Secondary | ICD-10-CM | POA: Diagnosis not present

## 2023-02-25 DIAGNOSIS — N184 Chronic kidney disease, stage 4 (severe): Secondary | ICD-10-CM | POA: Diagnosis not present

## 2023-02-25 DIAGNOSIS — J449 Chronic obstructive pulmonary disease, unspecified: Secondary | ICD-10-CM | POA: Diagnosis not present

## 2023-02-25 DIAGNOSIS — I272 Pulmonary hypertension, unspecified: Secondary | ICD-10-CM | POA: Diagnosis not present

## 2023-02-25 DIAGNOSIS — I2723 Pulmonary hypertension due to lung diseases and hypoxia: Secondary | ICD-10-CM | POA: Diagnosis not present

## 2023-02-25 DIAGNOSIS — I509 Heart failure, unspecified: Secondary | ICD-10-CM | POA: Diagnosis not present

## 2023-02-27 DIAGNOSIS — I272 Pulmonary hypertension, unspecified: Secondary | ICD-10-CM | POA: Diagnosis not present

## 2023-02-27 DIAGNOSIS — F03B3 Unspecified dementia, moderate, with mood disturbance: Secondary | ICD-10-CM | POA: Diagnosis not present

## 2023-02-27 DIAGNOSIS — I2723 Pulmonary hypertension due to lung diseases and hypoxia: Secondary | ICD-10-CM | POA: Diagnosis not present

## 2023-02-27 DIAGNOSIS — I509 Heart failure, unspecified: Secondary | ICD-10-CM | POA: Diagnosis not present

## 2023-02-27 DIAGNOSIS — J9611 Chronic respiratory failure with hypoxia: Secondary | ICD-10-CM | POA: Diagnosis not present

## 2023-02-27 DIAGNOSIS — J449 Chronic obstructive pulmonary disease, unspecified: Secondary | ICD-10-CM | POA: Diagnosis not present

## 2023-02-27 DIAGNOSIS — F32A Depression, unspecified: Secondary | ICD-10-CM | POA: Diagnosis not present

## 2023-02-27 DIAGNOSIS — N184 Chronic kidney disease, stage 4 (severe): Secondary | ICD-10-CM | POA: Diagnosis not present

## 2023-03-01 DIAGNOSIS — N184 Chronic kidney disease, stage 4 (severe): Secondary | ICD-10-CM | POA: Diagnosis not present

## 2023-03-01 DIAGNOSIS — J449 Chronic obstructive pulmonary disease, unspecified: Secondary | ICD-10-CM | POA: Diagnosis not present

## 2023-03-01 DIAGNOSIS — I509 Heart failure, unspecified: Secondary | ICD-10-CM | POA: Diagnosis not present

## 2023-03-01 DIAGNOSIS — I272 Pulmonary hypertension, unspecified: Secondary | ICD-10-CM | POA: Diagnosis not present

## 2023-03-01 DIAGNOSIS — I2723 Pulmonary hypertension due to lung diseases and hypoxia: Secondary | ICD-10-CM | POA: Diagnosis not present

## 2023-03-01 DIAGNOSIS — J9611 Chronic respiratory failure with hypoxia: Secondary | ICD-10-CM | POA: Diagnosis not present

## 2023-03-03 DIAGNOSIS — J9611 Chronic respiratory failure with hypoxia: Secondary | ICD-10-CM | POA: Diagnosis not present

## 2023-03-03 DIAGNOSIS — I509 Heart failure, unspecified: Secondary | ICD-10-CM | POA: Diagnosis not present

## 2023-03-03 DIAGNOSIS — I2723 Pulmonary hypertension due to lung diseases and hypoxia: Secondary | ICD-10-CM | POA: Diagnosis not present

## 2023-03-03 DIAGNOSIS — N184 Chronic kidney disease, stage 4 (severe): Secondary | ICD-10-CM | POA: Diagnosis not present

## 2023-03-03 DIAGNOSIS — J449 Chronic obstructive pulmonary disease, unspecified: Secondary | ICD-10-CM | POA: Diagnosis not present

## 2023-03-03 DIAGNOSIS — I272 Pulmonary hypertension, unspecified: Secondary | ICD-10-CM | POA: Diagnosis not present

## 2023-03-07 DIAGNOSIS — I509 Heart failure, unspecified: Secondary | ICD-10-CM | POA: Diagnosis not present

## 2023-03-07 DIAGNOSIS — N184 Chronic kidney disease, stage 4 (severe): Secondary | ICD-10-CM | POA: Diagnosis not present

## 2023-03-07 DIAGNOSIS — I2723 Pulmonary hypertension due to lung diseases and hypoxia: Secondary | ICD-10-CM | POA: Diagnosis not present

## 2023-03-07 DIAGNOSIS — J9611 Chronic respiratory failure with hypoxia: Secondary | ICD-10-CM | POA: Diagnosis not present

## 2023-03-07 DIAGNOSIS — J449 Chronic obstructive pulmonary disease, unspecified: Secondary | ICD-10-CM | POA: Diagnosis not present

## 2023-03-07 DIAGNOSIS — I272 Pulmonary hypertension, unspecified: Secondary | ICD-10-CM | POA: Diagnosis not present

## 2023-03-08 DIAGNOSIS — I509 Heart failure, unspecified: Secondary | ICD-10-CM | POA: Diagnosis not present

## 2023-03-08 DIAGNOSIS — N184 Chronic kidney disease, stage 4 (severe): Secondary | ICD-10-CM | POA: Diagnosis not present

## 2023-03-08 DIAGNOSIS — I2723 Pulmonary hypertension due to lung diseases and hypoxia: Secondary | ICD-10-CM | POA: Diagnosis not present

## 2023-03-08 DIAGNOSIS — I272 Pulmonary hypertension, unspecified: Secondary | ICD-10-CM | POA: Diagnosis not present

## 2023-03-08 DIAGNOSIS — J449 Chronic obstructive pulmonary disease, unspecified: Secondary | ICD-10-CM | POA: Diagnosis not present

## 2023-03-08 DIAGNOSIS — J9611 Chronic respiratory failure with hypoxia: Secondary | ICD-10-CM | POA: Diagnosis not present

## 2023-03-10 DIAGNOSIS — I2723 Pulmonary hypertension due to lung diseases and hypoxia: Secondary | ICD-10-CM | POA: Diagnosis not present

## 2023-03-10 DIAGNOSIS — I509 Heart failure, unspecified: Secondary | ICD-10-CM | POA: Diagnosis not present

## 2023-03-10 DIAGNOSIS — I272 Pulmonary hypertension, unspecified: Secondary | ICD-10-CM | POA: Diagnosis not present

## 2023-03-10 DIAGNOSIS — J449 Chronic obstructive pulmonary disease, unspecified: Secondary | ICD-10-CM | POA: Diagnosis not present

## 2023-03-10 DIAGNOSIS — N184 Chronic kidney disease, stage 4 (severe): Secondary | ICD-10-CM | POA: Diagnosis not present

## 2023-03-10 DIAGNOSIS — J9611 Chronic respiratory failure with hypoxia: Secondary | ICD-10-CM | POA: Diagnosis not present

## 2023-03-14 DIAGNOSIS — I509 Heart failure, unspecified: Secondary | ICD-10-CM | POA: Diagnosis not present

## 2023-03-14 DIAGNOSIS — I272 Pulmonary hypertension, unspecified: Secondary | ICD-10-CM | POA: Diagnosis not present

## 2023-03-14 DIAGNOSIS — I2723 Pulmonary hypertension due to lung diseases and hypoxia: Secondary | ICD-10-CM | POA: Diagnosis not present

## 2023-03-14 DIAGNOSIS — N184 Chronic kidney disease, stage 4 (severe): Secondary | ICD-10-CM | POA: Diagnosis not present

## 2023-03-14 DIAGNOSIS — J9611 Chronic respiratory failure with hypoxia: Secondary | ICD-10-CM | POA: Diagnosis not present

## 2023-03-14 DIAGNOSIS — J449 Chronic obstructive pulmonary disease, unspecified: Secondary | ICD-10-CM | POA: Diagnosis not present

## 2023-03-16 ENCOUNTER — Ambulatory Visit: Payer: Medicare Other | Admitting: Pulmonary Disease

## 2023-03-16 DIAGNOSIS — I272 Pulmonary hypertension, unspecified: Secondary | ICD-10-CM | POA: Diagnosis not present

## 2023-03-16 DIAGNOSIS — J449 Chronic obstructive pulmonary disease, unspecified: Secondary | ICD-10-CM | POA: Diagnosis not present

## 2023-03-16 DIAGNOSIS — N184 Chronic kidney disease, stage 4 (severe): Secondary | ICD-10-CM | POA: Diagnosis not present

## 2023-03-16 DIAGNOSIS — I509 Heart failure, unspecified: Secondary | ICD-10-CM | POA: Diagnosis not present

## 2023-03-16 DIAGNOSIS — I2723 Pulmonary hypertension due to lung diseases and hypoxia: Secondary | ICD-10-CM | POA: Diagnosis not present

## 2023-03-16 DIAGNOSIS — J9611 Chronic respiratory failure with hypoxia: Secondary | ICD-10-CM | POA: Diagnosis not present

## 2023-03-18 DIAGNOSIS — J9611 Chronic respiratory failure with hypoxia: Secondary | ICD-10-CM | POA: Diagnosis not present

## 2023-03-18 DIAGNOSIS — I509 Heart failure, unspecified: Secondary | ICD-10-CM | POA: Diagnosis not present

## 2023-03-18 DIAGNOSIS — I2723 Pulmonary hypertension due to lung diseases and hypoxia: Secondary | ICD-10-CM | POA: Diagnosis not present

## 2023-03-18 DIAGNOSIS — N184 Chronic kidney disease, stage 4 (severe): Secondary | ICD-10-CM | POA: Diagnosis not present

## 2023-03-18 DIAGNOSIS — J449 Chronic obstructive pulmonary disease, unspecified: Secondary | ICD-10-CM | POA: Diagnosis not present

## 2023-03-18 DIAGNOSIS — I272 Pulmonary hypertension, unspecified: Secondary | ICD-10-CM | POA: Diagnosis not present

## 2023-03-21 DIAGNOSIS — I509 Heart failure, unspecified: Secondary | ICD-10-CM | POA: Diagnosis not present

## 2023-03-21 DIAGNOSIS — I272 Pulmonary hypertension, unspecified: Secondary | ICD-10-CM | POA: Diagnosis not present

## 2023-03-21 DIAGNOSIS — J9611 Chronic respiratory failure with hypoxia: Secondary | ICD-10-CM | POA: Diagnosis not present

## 2023-03-21 DIAGNOSIS — I2723 Pulmonary hypertension due to lung diseases and hypoxia: Secondary | ICD-10-CM | POA: Diagnosis not present

## 2023-03-21 DIAGNOSIS — J449 Chronic obstructive pulmonary disease, unspecified: Secondary | ICD-10-CM | POA: Diagnosis not present

## 2023-03-21 DIAGNOSIS — N184 Chronic kidney disease, stage 4 (severe): Secondary | ICD-10-CM | POA: Diagnosis not present

## 2023-03-24 DIAGNOSIS — I272 Pulmonary hypertension, unspecified: Secondary | ICD-10-CM | POA: Diagnosis not present

## 2023-03-24 DIAGNOSIS — J449 Chronic obstructive pulmonary disease, unspecified: Secondary | ICD-10-CM | POA: Diagnosis not present

## 2023-03-24 DIAGNOSIS — I509 Heart failure, unspecified: Secondary | ICD-10-CM | POA: Diagnosis not present

## 2023-03-24 DIAGNOSIS — I2723 Pulmonary hypertension due to lung diseases and hypoxia: Secondary | ICD-10-CM | POA: Diagnosis not present

## 2023-03-24 DIAGNOSIS — N184 Chronic kidney disease, stage 4 (severe): Secondary | ICD-10-CM | POA: Diagnosis not present

## 2023-03-24 DIAGNOSIS — J9611 Chronic respiratory failure with hypoxia: Secondary | ICD-10-CM | POA: Diagnosis not present

## 2023-03-26 DIAGNOSIS — I2723 Pulmonary hypertension due to lung diseases and hypoxia: Secondary | ICD-10-CM | POA: Diagnosis not present

## 2023-03-26 DIAGNOSIS — N184 Chronic kidney disease, stage 4 (severe): Secondary | ICD-10-CM | POA: Diagnosis not present

## 2023-03-26 DIAGNOSIS — I509 Heart failure, unspecified: Secondary | ICD-10-CM | POA: Diagnosis not present

## 2023-03-26 DIAGNOSIS — J449 Chronic obstructive pulmonary disease, unspecified: Secondary | ICD-10-CM | POA: Diagnosis not present

## 2023-03-26 DIAGNOSIS — I272 Pulmonary hypertension, unspecified: Secondary | ICD-10-CM | POA: Diagnosis not present

## 2023-03-26 DIAGNOSIS — J9611 Chronic respiratory failure with hypoxia: Secondary | ICD-10-CM | POA: Diagnosis not present

## 2023-03-28 DIAGNOSIS — N184 Chronic kidney disease, stage 4 (severe): Secondary | ICD-10-CM | POA: Diagnosis not present

## 2023-03-28 DIAGNOSIS — I272 Pulmonary hypertension, unspecified: Secondary | ICD-10-CM | POA: Diagnosis not present

## 2023-03-28 DIAGNOSIS — I509 Heart failure, unspecified: Secondary | ICD-10-CM | POA: Diagnosis not present

## 2023-03-28 DIAGNOSIS — I2723 Pulmonary hypertension due to lung diseases and hypoxia: Secondary | ICD-10-CM | POA: Diagnosis not present

## 2023-03-28 DIAGNOSIS — J9611 Chronic respiratory failure with hypoxia: Secondary | ICD-10-CM | POA: Diagnosis not present

## 2023-03-28 DIAGNOSIS — J449 Chronic obstructive pulmonary disease, unspecified: Secondary | ICD-10-CM | POA: Diagnosis not present

## 2023-03-29 DIAGNOSIS — J9611 Chronic respiratory failure with hypoxia: Secondary | ICD-10-CM | POA: Diagnosis not present

## 2023-03-29 DIAGNOSIS — N184 Chronic kidney disease, stage 4 (severe): Secondary | ICD-10-CM | POA: Diagnosis not present

## 2023-03-29 DIAGNOSIS — J449 Chronic obstructive pulmonary disease, unspecified: Secondary | ICD-10-CM | POA: Diagnosis not present

## 2023-03-29 DIAGNOSIS — I272 Pulmonary hypertension, unspecified: Secondary | ICD-10-CM | POA: Diagnosis not present

## 2023-03-29 DIAGNOSIS — F03B3 Unspecified dementia, moderate, with mood disturbance: Secondary | ICD-10-CM | POA: Diagnosis not present

## 2023-03-29 DIAGNOSIS — I2723 Pulmonary hypertension due to lung diseases and hypoxia: Secondary | ICD-10-CM | POA: Diagnosis not present

## 2023-03-29 DIAGNOSIS — F32A Depression, unspecified: Secondary | ICD-10-CM | POA: Diagnosis not present

## 2023-03-29 DIAGNOSIS — I509 Heart failure, unspecified: Secondary | ICD-10-CM | POA: Diagnosis not present

## 2023-03-31 DIAGNOSIS — J449 Chronic obstructive pulmonary disease, unspecified: Secondary | ICD-10-CM | POA: Diagnosis not present

## 2023-03-31 DIAGNOSIS — N184 Chronic kidney disease, stage 4 (severe): Secondary | ICD-10-CM | POA: Diagnosis not present

## 2023-03-31 DIAGNOSIS — I509 Heart failure, unspecified: Secondary | ICD-10-CM | POA: Diagnosis not present

## 2023-03-31 DIAGNOSIS — I2723 Pulmonary hypertension due to lung diseases and hypoxia: Secondary | ICD-10-CM | POA: Diagnosis not present

## 2023-03-31 DIAGNOSIS — I272 Pulmonary hypertension, unspecified: Secondary | ICD-10-CM | POA: Diagnosis not present

## 2023-03-31 DIAGNOSIS — J9611 Chronic respiratory failure with hypoxia: Secondary | ICD-10-CM | POA: Diagnosis not present

## 2023-04-01 DIAGNOSIS — I509 Heart failure, unspecified: Secondary | ICD-10-CM | POA: Diagnosis not present

## 2023-04-01 DIAGNOSIS — I2723 Pulmonary hypertension due to lung diseases and hypoxia: Secondary | ICD-10-CM | POA: Diagnosis not present

## 2023-04-01 DIAGNOSIS — N184 Chronic kidney disease, stage 4 (severe): Secondary | ICD-10-CM | POA: Diagnosis not present

## 2023-04-01 DIAGNOSIS — J449 Chronic obstructive pulmonary disease, unspecified: Secondary | ICD-10-CM | POA: Diagnosis not present

## 2023-04-01 DIAGNOSIS — I272 Pulmonary hypertension, unspecified: Secondary | ICD-10-CM | POA: Diagnosis not present

## 2023-04-01 DIAGNOSIS — J9611 Chronic respiratory failure with hypoxia: Secondary | ICD-10-CM | POA: Diagnosis not present

## 2023-04-03 DIAGNOSIS — I2723 Pulmonary hypertension due to lung diseases and hypoxia: Secondary | ICD-10-CM | POA: Diagnosis not present

## 2023-04-03 DIAGNOSIS — N184 Chronic kidney disease, stage 4 (severe): Secondary | ICD-10-CM | POA: Diagnosis not present

## 2023-04-03 DIAGNOSIS — J449 Chronic obstructive pulmonary disease, unspecified: Secondary | ICD-10-CM | POA: Diagnosis not present

## 2023-04-03 DIAGNOSIS — I509 Heart failure, unspecified: Secondary | ICD-10-CM | POA: Diagnosis not present

## 2023-04-03 DIAGNOSIS — I272 Pulmonary hypertension, unspecified: Secondary | ICD-10-CM | POA: Diagnosis not present

## 2023-04-03 DIAGNOSIS — J9611 Chronic respiratory failure with hypoxia: Secondary | ICD-10-CM | POA: Diagnosis not present

## 2023-04-04 DIAGNOSIS — I509 Heart failure, unspecified: Secondary | ICD-10-CM | POA: Diagnosis not present

## 2023-04-04 DIAGNOSIS — I2723 Pulmonary hypertension due to lung diseases and hypoxia: Secondary | ICD-10-CM | POA: Diagnosis not present

## 2023-04-04 DIAGNOSIS — I272 Pulmonary hypertension, unspecified: Secondary | ICD-10-CM | POA: Diagnosis not present

## 2023-04-04 DIAGNOSIS — J449 Chronic obstructive pulmonary disease, unspecified: Secondary | ICD-10-CM | POA: Diagnosis not present

## 2023-04-04 DIAGNOSIS — J9611 Chronic respiratory failure with hypoxia: Secondary | ICD-10-CM | POA: Diagnosis not present

## 2023-04-04 DIAGNOSIS — N184 Chronic kidney disease, stage 4 (severe): Secondary | ICD-10-CM | POA: Diagnosis not present

## 2023-04-07 DIAGNOSIS — I509 Heart failure, unspecified: Secondary | ICD-10-CM | POA: Diagnosis not present

## 2023-04-07 DIAGNOSIS — J449 Chronic obstructive pulmonary disease, unspecified: Secondary | ICD-10-CM | POA: Diagnosis not present

## 2023-04-07 DIAGNOSIS — N184 Chronic kidney disease, stage 4 (severe): Secondary | ICD-10-CM | POA: Diagnosis not present

## 2023-04-07 DIAGNOSIS — I2723 Pulmonary hypertension due to lung diseases and hypoxia: Secondary | ICD-10-CM | POA: Diagnosis not present

## 2023-04-07 DIAGNOSIS — I272 Pulmonary hypertension, unspecified: Secondary | ICD-10-CM | POA: Diagnosis not present

## 2023-04-07 DIAGNOSIS — J9611 Chronic respiratory failure with hypoxia: Secondary | ICD-10-CM | POA: Diagnosis not present

## 2023-04-09 DIAGNOSIS — J449 Chronic obstructive pulmonary disease, unspecified: Secondary | ICD-10-CM | POA: Diagnosis not present

## 2023-04-09 DIAGNOSIS — N184 Chronic kidney disease, stage 4 (severe): Secondary | ICD-10-CM | POA: Diagnosis not present

## 2023-04-09 DIAGNOSIS — I509 Heart failure, unspecified: Secondary | ICD-10-CM | POA: Diagnosis not present

## 2023-04-09 DIAGNOSIS — I2723 Pulmonary hypertension due to lung diseases and hypoxia: Secondary | ICD-10-CM | POA: Diagnosis not present

## 2023-04-09 DIAGNOSIS — I272 Pulmonary hypertension, unspecified: Secondary | ICD-10-CM | POA: Diagnosis not present

## 2023-04-09 DIAGNOSIS — J9611 Chronic respiratory failure with hypoxia: Secondary | ICD-10-CM | POA: Diagnosis not present

## 2023-04-11 DIAGNOSIS — J449 Chronic obstructive pulmonary disease, unspecified: Secondary | ICD-10-CM | POA: Diagnosis not present

## 2023-04-11 DIAGNOSIS — N184 Chronic kidney disease, stage 4 (severe): Secondary | ICD-10-CM | POA: Diagnosis not present

## 2023-04-11 DIAGNOSIS — I509 Heart failure, unspecified: Secondary | ICD-10-CM | POA: Diagnosis not present

## 2023-04-11 DIAGNOSIS — I2723 Pulmonary hypertension due to lung diseases and hypoxia: Secondary | ICD-10-CM | POA: Diagnosis not present

## 2023-04-11 DIAGNOSIS — I272 Pulmonary hypertension, unspecified: Secondary | ICD-10-CM | POA: Diagnosis not present

## 2023-04-11 DIAGNOSIS — J9611 Chronic respiratory failure with hypoxia: Secondary | ICD-10-CM | POA: Diagnosis not present

## 2023-04-12 DIAGNOSIS — J9611 Chronic respiratory failure with hypoxia: Secondary | ICD-10-CM | POA: Diagnosis not present

## 2023-04-12 DIAGNOSIS — N184 Chronic kidney disease, stage 4 (severe): Secondary | ICD-10-CM | POA: Diagnosis not present

## 2023-04-12 DIAGNOSIS — I2723 Pulmonary hypertension due to lung diseases and hypoxia: Secondary | ICD-10-CM | POA: Diagnosis not present

## 2023-04-12 DIAGNOSIS — I509 Heart failure, unspecified: Secondary | ICD-10-CM | POA: Diagnosis not present

## 2023-04-12 DIAGNOSIS — J449 Chronic obstructive pulmonary disease, unspecified: Secondary | ICD-10-CM | POA: Diagnosis not present

## 2023-04-12 DIAGNOSIS — I272 Pulmonary hypertension, unspecified: Secondary | ICD-10-CM | POA: Diagnosis not present

## 2023-04-13 ENCOUNTER — Other Ambulatory Visit: Payer: Self-pay

## 2023-04-13 ENCOUNTER — Inpatient Hospital Stay (HOSPITAL_COMMUNITY)
Admission: EM | Admit: 2023-04-13 | Discharge: 2023-04-18 | DRG: 682 | Disposition: A | Discharge status: Hospice | Payer: Medicare Other | Attending: Internal Medicine | Admitting: Internal Medicine

## 2023-04-13 ENCOUNTER — Emergency Department (HOSPITAL_COMMUNITY): Payer: Medicare Other

## 2023-04-13 ENCOUNTER — Encounter (HOSPITAL_COMMUNITY): Payer: Self-pay

## 2023-04-13 DIAGNOSIS — J9611 Chronic respiratory failure with hypoxia: Secondary | ICD-10-CM | POA: Diagnosis not present

## 2023-04-13 DIAGNOSIS — E1040 Type 1 diabetes mellitus with diabetic neuropathy, unspecified: Secondary | ICD-10-CM | POA: Diagnosis not present

## 2023-04-13 DIAGNOSIS — Z7984 Long term (current) use of oral hypoglycemic drugs: Secondary | ICD-10-CM

## 2023-04-13 DIAGNOSIS — Z8616 Personal history of COVID-19: Secondary | ICD-10-CM

## 2023-04-13 DIAGNOSIS — Z888 Allergy status to other drugs, medicaments and biological substances status: Secondary | ICD-10-CM

## 2023-04-13 DIAGNOSIS — Z7189 Other specified counseling: Secondary | ICD-10-CM | POA: Diagnosis not present

## 2023-04-13 DIAGNOSIS — B9629 Other Escherichia coli [E. coli] as the cause of diseases classified elsewhere: Secondary | ICD-10-CM | POA: Diagnosis present

## 2023-04-13 DIAGNOSIS — I2489 Other forms of acute ischemic heart disease: Secondary | ICD-10-CM | POA: Diagnosis present

## 2023-04-13 DIAGNOSIS — I130 Hypertensive heart and chronic kidney disease with heart failure and stage 1 through stage 4 chronic kidney disease, or unspecified chronic kidney disease: Secondary | ICD-10-CM | POA: Diagnosis not present

## 2023-04-13 DIAGNOSIS — J849 Interstitial pulmonary disease, unspecified: Secondary | ICD-10-CM | POA: Diagnosis not present

## 2023-04-13 DIAGNOSIS — G9341 Metabolic encephalopathy: Secondary | ICD-10-CM | POA: Diagnosis present

## 2023-04-13 DIAGNOSIS — F0390 Unspecified dementia without behavioral disturbance: Secondary | ICD-10-CM | POA: Diagnosis not present

## 2023-04-13 DIAGNOSIS — Z8249 Family history of ischemic heart disease and other diseases of the circulatory system: Secondary | ICD-10-CM

## 2023-04-13 DIAGNOSIS — Z8701 Personal history of pneumonia (recurrent): Secondary | ICD-10-CM

## 2023-04-13 DIAGNOSIS — R55 Syncope and collapse: Principal | ICD-10-CM | POA: Diagnosis present

## 2023-04-13 DIAGNOSIS — F03B11 Unspecified dementia, moderate, with agitation: Secondary | ICD-10-CM | POA: Diagnosis not present

## 2023-04-13 DIAGNOSIS — I10 Essential (primary) hypertension: Secondary | ICD-10-CM | POA: Diagnosis not present

## 2023-04-13 DIAGNOSIS — Z515 Encounter for palliative care: Secondary | ICD-10-CM | POA: Diagnosis not present

## 2023-04-13 DIAGNOSIS — Z6841 Body Mass Index (BMI) 40.0 and over, adult: Secondary | ICD-10-CM

## 2023-04-13 DIAGNOSIS — N390 Urinary tract infection, site not specified: Secondary | ICD-10-CM | POA: Diagnosis present

## 2023-04-13 DIAGNOSIS — F03B4 Unspecified dementia, moderate, with anxiety: Secondary | ICD-10-CM | POA: Diagnosis present

## 2023-04-13 DIAGNOSIS — I959 Hypotension, unspecified: Secondary | ICD-10-CM | POA: Diagnosis present

## 2023-04-13 DIAGNOSIS — Z9049 Acquired absence of other specified parts of digestive tract: Secondary | ICD-10-CM

## 2023-04-13 DIAGNOSIS — Z820 Family history of epilepsy and other diseases of the nervous system: Secondary | ICD-10-CM

## 2023-04-13 DIAGNOSIS — F03B3 Unspecified dementia, moderate, with mood disturbance: Secondary | ICD-10-CM | POA: Diagnosis present

## 2023-04-13 DIAGNOSIS — R9389 Abnormal findings on diagnostic imaging of other specified body structures: Secondary | ICD-10-CM | POA: Diagnosis not present

## 2023-04-13 DIAGNOSIS — R7989 Other specified abnormal findings of blood chemistry: Secondary | ICD-10-CM | POA: Diagnosis not present

## 2023-04-13 DIAGNOSIS — Z8542 Personal history of malignant neoplasm of other parts of uterus: Secondary | ICD-10-CM

## 2023-04-13 DIAGNOSIS — Z1612 Extended spectrum beta lactamase (ESBL) resistance: Secondary | ICD-10-CM | POA: Diagnosis present

## 2023-04-13 DIAGNOSIS — E785 Hyperlipidemia, unspecified: Secondary | ICD-10-CM | POA: Diagnosis not present

## 2023-04-13 DIAGNOSIS — N179 Acute kidney failure, unspecified: Secondary | ICD-10-CM | POA: Diagnosis not present

## 2023-04-13 DIAGNOSIS — Z87891 Personal history of nicotine dependence: Secondary | ICD-10-CM

## 2023-04-13 DIAGNOSIS — Z87440 Personal history of urinary (tract) infections: Secondary | ICD-10-CM

## 2023-04-13 DIAGNOSIS — R404 Transient alteration of awareness: Secondary | ICD-10-CM | POA: Diagnosis not present

## 2023-04-13 DIAGNOSIS — N1831 Chronic kidney disease, stage 3a: Secondary | ICD-10-CM | POA: Diagnosis present

## 2023-04-13 DIAGNOSIS — E860 Dehydration: Secondary | ICD-10-CM | POA: Diagnosis not present

## 2023-04-13 DIAGNOSIS — Z833 Family history of diabetes mellitus: Secondary | ICD-10-CM

## 2023-04-13 DIAGNOSIS — B9620 Unspecified Escherichia coli [E. coli] as the cause of diseases classified elsewhere: Secondary | ICD-10-CM | POA: Diagnosis present

## 2023-04-13 DIAGNOSIS — Z79899 Other long term (current) drug therapy: Secondary | ICD-10-CM

## 2023-04-13 DIAGNOSIS — F32A Depression, unspecified: Secondary | ICD-10-CM | POA: Diagnosis present

## 2023-04-13 DIAGNOSIS — E876 Hypokalemia: Secondary | ICD-10-CM | POA: Diagnosis not present

## 2023-04-13 DIAGNOSIS — N3090 Cystitis, unspecified without hematuria: Secondary | ICD-10-CM | POA: Diagnosis present

## 2023-04-13 DIAGNOSIS — I480 Paroxysmal atrial fibrillation: Secondary | ICD-10-CM | POA: Diagnosis not present

## 2023-04-13 DIAGNOSIS — Z7952 Long term (current) use of systemic steroids: Secondary | ICD-10-CM

## 2023-04-13 DIAGNOSIS — N170 Acute kidney failure with tubular necrosis: Principal | ICD-10-CM | POA: Diagnosis present

## 2023-04-13 DIAGNOSIS — I5022 Chronic systolic (congestive) heart failure: Secondary | ICD-10-CM | POA: Diagnosis present

## 2023-04-13 DIAGNOSIS — K219 Gastro-esophageal reflux disease without esophagitis: Secondary | ICD-10-CM | POA: Diagnosis present

## 2023-04-13 DIAGNOSIS — Z91199 Patient's noncompliance with other medical treatment and regimen due to unspecified reason: Secondary | ICD-10-CM | POA: Diagnosis not present

## 2023-04-13 DIAGNOSIS — L89329 Pressure ulcer of left buttock, unspecified stage: Secondary | ICD-10-CM | POA: Diagnosis present

## 2023-04-13 DIAGNOSIS — E1169 Type 2 diabetes mellitus with other specified complication: Secondary | ICD-10-CM

## 2023-04-13 DIAGNOSIS — Z7401 Bed confinement status: Secondary | ICD-10-CM | POA: Diagnosis not present

## 2023-04-13 DIAGNOSIS — Z66 Do not resuscitate: Secondary | ICD-10-CM | POA: Diagnosis present

## 2023-04-13 DIAGNOSIS — I4891 Unspecified atrial fibrillation: Secondary | ICD-10-CM | POA: Diagnosis present

## 2023-04-13 DIAGNOSIS — R000 Tachycardia, unspecified: Secondary | ICD-10-CM | POA: Diagnosis not present

## 2023-04-13 DIAGNOSIS — R638 Other symptoms and signs concerning food and fluid intake: Secondary | ICD-10-CM | POA: Diagnosis not present

## 2023-04-13 DIAGNOSIS — Z789 Other specified health status: Secondary | ICD-10-CM | POA: Diagnosis not present

## 2023-04-13 DIAGNOSIS — L89159 Pressure ulcer of sacral region, unspecified stage: Secondary | ICD-10-CM | POA: Diagnosis present

## 2023-04-13 DIAGNOSIS — N189 Chronic kidney disease, unspecified: Secondary | ICD-10-CM | POA: Diagnosis not present

## 2023-04-13 DIAGNOSIS — E1122 Type 2 diabetes mellitus with diabetic chronic kidney disease: Secondary | ICD-10-CM | POA: Diagnosis present

## 2023-04-13 DIAGNOSIS — Z823 Family history of stroke: Secondary | ICD-10-CM

## 2023-04-13 DIAGNOSIS — E7800 Pure hypercholesterolemia, unspecified: Secondary | ICD-10-CM | POA: Diagnosis present

## 2023-04-13 DIAGNOSIS — E119 Type 2 diabetes mellitus without complications: Secondary | ICD-10-CM

## 2023-04-13 DIAGNOSIS — R1310 Dysphagia, unspecified: Secondary | ICD-10-CM | POA: Diagnosis present

## 2023-04-13 DIAGNOSIS — R627 Adult failure to thrive: Secondary | ICD-10-CM | POA: Diagnosis present

## 2023-04-13 DIAGNOSIS — Z7901 Long term (current) use of anticoagulants: Secondary | ICD-10-CM

## 2023-04-13 DIAGNOSIS — E2740 Unspecified adrenocortical insufficiency: Secondary | ICD-10-CM | POA: Diagnosis present

## 2023-04-13 DIAGNOSIS — R4182 Altered mental status, unspecified: Secondary | ICD-10-CM | POA: Diagnosis not present

## 2023-04-13 DIAGNOSIS — D72829 Elevated white blood cell count, unspecified: Secondary | ICD-10-CM | POA: Diagnosis not present

## 2023-04-13 DIAGNOSIS — I700 Atherosclerosis of aorta: Secondary | ICD-10-CM | POA: Diagnosis not present

## 2023-04-13 DIAGNOSIS — I9589 Other hypotension: Secondary | ICD-10-CM | POA: Diagnosis present

## 2023-04-13 DIAGNOSIS — K429 Umbilical hernia without obstruction or gangrene: Secondary | ICD-10-CM | POA: Diagnosis not present

## 2023-04-13 DIAGNOSIS — Z90710 Acquired absence of both cervix and uterus: Secondary | ICD-10-CM

## 2023-04-13 DIAGNOSIS — G4730 Sleep apnea, unspecified: Secondary | ICD-10-CM | POA: Diagnosis present

## 2023-04-13 LAB — URINALYSIS, ROUTINE W REFLEX MICROSCOPIC
Bilirubin Urine: NEGATIVE
Glucose, UA: NEGATIVE mg/dL
Ketones, ur: NEGATIVE mg/dL
Nitrite: NEGATIVE
Protein, ur: NEGATIVE mg/dL
Specific Gravity, Urine: 1.01 (ref 1.005–1.030)
pH: 6 (ref 5.0–8.0)

## 2023-04-13 LAB — CBC WITH DIFFERENTIAL/PLATELET
Abs Immature Granulocytes: 0.34 10*3/uL — ABNORMAL HIGH (ref 0.00–0.07)
Basophils Absolute: 0.1 10*3/uL (ref 0.0–0.1)
Basophils Relative: 0 %
Eosinophils Absolute: 0 10*3/uL (ref 0.0–0.5)
Eosinophils Relative: 0 %
HCT: 36.7 % (ref 36.0–46.0)
Hemoglobin: 12.4 g/dL (ref 12.0–15.0)
Immature Granulocytes: 2 %
Lymphocytes Relative: 16 %
Lymphs Abs: 2.3 10*3/uL (ref 0.7–4.0)
MCH: 32.7 pg (ref 26.0–34.0)
MCHC: 33.8 g/dL (ref 30.0–36.0)
MCV: 96.8 fL (ref 80.0–100.0)
Monocytes Absolute: 0.8 10*3/uL (ref 0.1–1.0)
Monocytes Relative: 6 %
Neutro Abs: 10.5 10*3/uL — ABNORMAL HIGH (ref 1.7–7.7)
Neutrophils Relative %: 76 %
Platelets: 172 10*3/uL (ref 150–400)
RBC: 3.79 MIL/uL — ABNORMAL LOW (ref 3.87–5.11)
RDW: 13.3 % (ref 11.5–15.5)
WBC: 14.1 10*3/uL — ABNORMAL HIGH (ref 4.0–10.5)
nRBC: 0.3 % — ABNORMAL HIGH (ref 0.0–0.2)

## 2023-04-13 LAB — GLUCOSE, CAPILLARY
Glucose-Capillary: 72 mg/dL (ref 70–99)
Glucose-Capillary: 105 mg/dL — ABNORMAL HIGH (ref 70–99)

## 2023-04-13 LAB — COMPREHENSIVE METABOLIC PANEL
ALT: 43 U/L (ref 0–44)
AST: 28 U/L (ref 15–41)
Albumin: 3 g/dL — ABNORMAL LOW (ref 3.5–5.0)
Alkaline Phosphatase: 64 U/L (ref 38–126)
BUN: 56 mg/dL — ABNORMAL HIGH (ref 8–23)
CO2: >45 mmol/L — ABNORMAL HIGH (ref 22–32)
Calcium: 9 mg/dL (ref 8.9–10.3)
Chloride: 74 mmol/L — ABNORMAL LOW (ref 98–111)
Creatinine, Ser: 2.4 mg/dL — ABNORMAL HIGH (ref 0.44–1.00)
GFR, Estimated: 20 mL/min — ABNORMAL LOW (ref >60)
Glucose, Bld: 113 mg/dL — ABNORMAL HIGH (ref 70–99)
Potassium: 2.2 mmol/L — CL (ref 3.5–5.1)
Sodium: 138 mmol/L (ref 135–145)
Total Bilirubin: 1.2 mg/dL (ref 0.3–1.2)
Total Protein: 5.9 g/dL — ABNORMAL LOW (ref 6.5–8.1)

## 2023-04-13 LAB — D-DIMER, QUANTITATIVE: D-Dimer, Quant: <0.27 ug{FEU}/mL (ref 0.00–0.50)

## 2023-04-13 LAB — TROPONIN I (HIGH SENSITIVITY)
Troponin I (High Sensitivity): 96 ng/L — ABNORMAL HIGH (ref <18)
Troponin I (High Sensitivity): 92 ng/L — ABNORMAL HIGH (ref <18)

## 2023-04-13 LAB — BRAIN NATRIURETIC PEPTIDE: B Natriuretic Peptide: 496.5 pg/mL — ABNORMAL HIGH (ref 0.0–100.0)

## 2023-04-13 LAB — CREATININE, SERUM
Creatinine, Ser: 2.48 mg/dL — ABNORMAL HIGH (ref 0.44–1.00)
GFR, Estimated: 19 mL/min — ABNORMAL LOW (ref >60)

## 2023-04-13 LAB — MAGNESIUM: Magnesium: 2.3 mg/dL (ref 1.7–2.4)

## 2023-04-13 MED ORDER — PREDNISONE 20 MG PO TABS: 20.0000 mg | ORAL_TABLET | Freq: Every day | ORAL | Status: DC

## 2023-04-13 MED ORDER — APIXABAN 5 MG PO TABS
5.0000 mg | ORAL_TABLET | Freq: Two times a day (BID) | ORAL | Status: DC
  Administered 2023-04-13 – 2023-04-14 (×4): 5 mg via ORAL
  Filled 2023-04-13 (×4): qty 1

## 2023-04-13 MED ORDER — INSULIN ASPART 100 UNIT/ML IJ SOLN: 0.0000 [IU] | Freq: Every day | INTRAMUSCULAR | Status: DC

## 2023-04-13 MED ORDER — SODIUM CHLORIDE 0.9 % IV SOLN
1.0000 g | Freq: Once | INTRAVENOUS | Status: AC
  Administered 2023-04-13: 1 g via INTRAVENOUS
  Filled 2023-04-13: qty 20

## 2023-04-13 MED ORDER — AMITRIPTYLINE HCL 50 MG PO TABS
50.0000 mg | ORAL_TABLET | Freq: Every day | ORAL | Status: DC
  Administered 2023-04-13 – 2023-04-15 (×3): 50 mg via ORAL
  Filled 2023-04-13 (×3): qty 1

## 2023-04-13 MED ORDER — INSULIN ASPART 100 UNIT/ML IJ SOLN: 0.0000 [IU] | Freq: Three times a day (TID) | INTRAMUSCULAR | Status: DC

## 2023-04-13 MED ORDER — HYDROCORTISONE SOD SUC (PF) 100 MG IJ SOLR
100.0000 mg | Freq: Three times a day (TID) | INTRAMUSCULAR | Status: DC
  Administered 2023-04-13 – 2023-04-15 (×4): 100 mg via INTRAVENOUS
  Filled 2023-04-13 (×7): qty 2

## 2023-04-13 MED ORDER — SODIUM CHLORIDE 0.9 % IV SOLN
1.0000 g | Freq: Once | INTRAVENOUS | Status: DC
  Administered 2023-04-13: 1 g via INTRAVENOUS
  Filled 2023-04-13: qty 10

## 2023-04-13 MED ORDER — POTASSIUM CHLORIDE 10 MEQ/100ML IV SOLN
10.0000 meq | INTRAVENOUS | Status: AC
  Administered 2023-04-13 – 2023-04-14 (×4): 10 meq via INTRAVENOUS
  Filled 2023-04-13: qty 100

## 2023-04-13 MED ORDER — SERTRALINE HCL 25 MG PO TABS
25.0000 mg | ORAL_TABLET | Freq: Every day | ORAL | Status: DC
  Administered 2023-04-13 – 2023-04-17 (×5): 25 mg via ORAL
  Filled 2023-04-13 (×6): qty 1

## 2023-04-13 MED ORDER — FUROSEMIDE 10 MG/ML IJ SOLN
20.0000 mg | Freq: Once | INTRAMUSCULAR | Status: AC
  Administered 2023-04-13: 20 mg via INTRAVENOUS

## 2023-04-13 MED ORDER — INSULIN ASPART 100 UNIT/ML IJ SOLN
0.0000 [IU] | Freq: Three times a day (TID) | INTRAMUSCULAR | Status: DC
  Administered 2023-04-14: 3 [IU] via SUBCUTANEOUS
  Administered 2023-04-14: 2 [IU] via SUBCUTANEOUS
  Administered 2023-04-14: 1 [IU] via SUBCUTANEOUS
  Administered 2023-04-15: 7 [IU] via SUBCUTANEOUS

## 2023-04-13 MED ORDER — POTASSIUM CHLORIDE 20 MEQ PO PACK
40.0000 meq | PACK | Freq: Once | ORAL | Status: AC
  Administered 2023-04-13: 40 meq via ORAL
  Filled 2023-04-13: qty 2

## 2023-04-13 MED ORDER — ALBUTEROL SULFATE (2.5 MG/3ML) 0.083% IN NEBU: 2.5000 mg | INHALATION_SOLUTION | Freq: Four times a day (QID) | RESPIRATORY_TRACT | Status: DC | PRN

## 2023-04-13 MED ORDER — ACETAMINOPHEN 325 MG PO TABS: 650.0000 mg | ORAL_TABLET | Freq: Four times a day (QID) | ORAL | Status: DC | PRN

## 2023-04-13 MED ORDER — SODIUM CHLORIDE 0.9 % IV SOLN
500.0000 mg | Freq: Two times a day (BID) | INTRAVENOUS | Status: DC
  Administered 2023-04-14: 500 mg via INTRAVENOUS
  Filled 2023-04-13 (×3): qty 10

## 2023-04-13 MED ORDER — SODIUM CHLORIDE 0.9% FLUSH
3.0000 mL | Freq: Two times a day (BID) | INTRAVENOUS | Status: DC
  Administered 2023-04-14 – 2023-04-18 (×6): 3 mL via INTRAVENOUS

## 2023-04-13 MED ORDER — ACETAMINOPHEN 650 MG RE SUPP: 650.0000 mg | Freq: Four times a day (QID) | RECTAL | Status: DC | PRN

## 2023-04-13 NOTE — Progress Notes (Signed)
Pharmacy Antibiotic Note  Kathy [name] is a 82 y.o. 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 coli, admitted on 04/13/2023 due to AMS/syncope 2/2 suspected UTI.  Pharmacy has been consulted for meropenem dosing.  Previous 3 urine cultures with resistant Ecoli only sensitive to meropenem Afebrile, WBC 14.1, BP 101/43, HR 59, RR 14 Scr 2.48 (baseline ~1) Finished a course of ciprofloxacin for UTI 2 days ago UA positive for leukocytes, few bacteria, 11-20 WBCs, no squamous epithelial cells  Plan: Start Meropenem 500mg  IV every 12 hours Monitor CBC, renal function, and signs of clinical improvement Follow up urine cultures and deescalate as able  Height: 5' (152.4 cm) Weight: 96.6 kg (213 lb) (from 02/15/23) IBW/kg (Calculated) : 45.5  Temp (24hrs), Avg:97.8 F (36.6 C), Min:97.5 F (36.4 C), Max:98 F (36.7 C)  Recent Labs  Lab 04/13/23 1219  WBC 14.1*    CrCl cannot be calculated (Patient's most recent lab result is older than the maximum 21 days allowed.).    Allergies  Allergen Reactions   Lipitor [Atorvastatin] Other (See Comments)    Memory issues   Requip [Ropinirole Hcl] Other (See Comments)    Pt reports feeling generally unwell on this medication    Antimicrobials this admission: 10/16 ceftriaxone x1 10/16 meropenem >>    Thank you for allowing pharmacy to be a part of this patient's care.   Stephenie Acres, PharmD PGY1 Pharmacy Resident 04/13/2023 2:52 PM

## 2023-04-13 NOTE — H&P (Signed)
History and Physical    Patient: Kathy [name] LKG:[id number] DOB: [birth year] DOA: 04/13/2023 DOS: the patient was seen and examined on 04/13/2023 PCP: Corwin Levins, MD  Patient coming from: Home via EMS  Chief Complaint:  Chief Complaint  Patient presents with   Loss of Consciousness   HPI: Kathy [name] is a 82 y.o. 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  History is obtained from family present at bedside and over the phone.  The patient's son was trying to sit her on the side of the bed this morning when she went limp and fell forward.  Patient was reported to been "out of it" for approximately 1 to 3 minutes prior to coming back to baseline.  No seizure activity was reported.  She has not had any reported fever, chest pain, nausea, vomiting, or diarrhea.  At baseline she is alert and oriented to self and is bedbound needing assistance with all activities.  She had been on hospice and family notes that over the last 3 months or so her confusion has seemed to worsen.  Her daughter makes note that the patient had just completed a course of ciprofloxacin for suspected urinary tract infection.  She had been complaining of some abdominal pain, but also has history of umbilical hernia.  Other associated symptoms include chronic cough.  Family makes note that they have rescinded the hospice designation and wanted the patient to be treated at the hospital.  They confirmed that patient is DNR estimate remain in place.  Lastly daughter makes note that patient's blood sugars have been running in the 300s, but were 97 this morning.  In the emergency department patient was noted to be afebrile with blood pressure  120/45.  Chest x-ray noted chronic interstitial lung disease.  WBC 14.1, BNP 496.5, and high-sensitivity troponin 96.  Chest x-ray noted chronic interstitial lung disease without definitive acute process.  Urinalysis was positive for hemoglobin, large leukocytes, few bacteria, 11-20 WBCs.  Patient had initially been ordered Rocephin by the ED provider and Lasix 20 mg IV.   Review of Systems: As mentioned in the history of present illness. All other systems reviewed and are negative.  Limited due to patient's history of dementia.  Past Medical History:  Diagnosis Date   Arthritis    fingers   CKD (chronic kidney disease) stage 3, GFR 30-59 ml/min (HCC) 11/30/2017   Depression    Dizziness    in AM, getting out of bed   Dysrhythmia    A fib   Endometrial ca (HCC) 11/30/2017   S/p surgury 1990's   Fall    GERD (gastroesophageal reflux disease) 11/30/2017   HLD (hyperlipidemia) 11/30/2017   Hypercholesteremia    Hypertension    Interstitial lung disease (HCC)    Neuropathy    bilateral feet   Pneumonia 12/14/2021   Shortness of breath dyspnea    Sleep apnea    has CPAP, doesn't use   Umbilical hernia    Past Surgical History:  Procedure Laterality Date   ABDOMINAL HYSTERECTOMY     BROW LIFT Bilateral 07/15/2015   Procedure: BLEPHAROPLASTY;  Surgeon: Imagene Riches, MD;  Location: [facility];  Service: Ophthalmology;  Laterality: Bilateral;   BUBBLE STUDY  08/18/2022   Procedure: BUBBLE STUDY;  Surgeon: Tessa Lerner, DO;  Location: [facility];  Service: Cardiovascular;;   CARDIOVERSION N/A 08/18/2022   Procedure: CARDIOVERSION;  Surgeon: Tessa Lerner, DO;  Location: [facility];  Service: Cardiovascular;  Laterality: N/A;   CATARACT EXTRACTION W/PHACO Right 12/31/2021   Procedure: CATARACT EXTRACTION PHACO AND INTRAOCULAR LENS PLACEMENT (IOC) RIGHT;  Surgeon: Estanislado Pandy, MD;  Location: [facility];  Service: Ophthalmology;  Laterality: Right;   17.11 1:46.1   CHOLECYSTECTOMY     HAMMER TOE SURGERY     HERNIA REPAIR     KNEE ARTHROSCOPY Bilateral    PTOSIS REPAIR Bilateral 07/15/2015   Procedure: PTOSIS REPAIR;  Surgeon: Imagene Riches, MD;  Location: [facility];  Service: Ophthalmology;  Laterality: Bilateral;  CPAP   TEE WITHOUT CARDIOVERSION N/A 08/18/2022   Procedure: TRANSESOPHAGEAL ECHOCARDIOGRAM (TEE);  Surgeon: Tessa Lerner, DO;  Location: [facility];  Service: Cardiovascular;  Laterality: N/A;   TONSILLECTOMY     Social History:  reports that she quit smoking about 36 years ago. Her smoking use included cigarettes. She started smoking about 46 years ago. She has a 10 pack-year smoking history. She has never used smokeless tobacco. She reports that she does not drink alcohol and does not use drugs.  Allergies  Allergen Reactions   Lipitor [Atorvastatin] Other (See Comments)    Memory issues   Requip [Ropinirole Hcl] Other (See Comments)    Pt reports feeling generally unwell on this medication    Family History  Problem Relation Age of Onset   Congestive Heart Failure Mother    Stroke Father    Parkinson's disease Father    Diabetes Son     Prior to Admission medications   Medication Sig Start Date End Date Taking? Authorizing Provider  albuterol (VENTOLIN HFA) 108 (90 Base) MCG/ACT inhaler Inhale 1-2 puffs into the lungs every 6 (six) hours as needed for wheezing or shortness of breath. Patient taking differently: Inhale 1-2 puffs into the lungs as needed for wheezing or shortness of breath. 10/21/22  Yes Corwin Levins, MD  amitriptyline (ELAVIL) 100 MG tablet Take 50 mg by mouth at bedtime.   Yes [provider]  apixaban (ELIQUIS) 5 MG TABS tablet Take 1 tablet (5 mg total) by mouth 2 (two) times daily. 01/06/23  Yes Patwardhan, Manish J, MD  Cholecalciferol (VITAMIN D-3) 25 MCG (1000 UT) CAPS Take 1 capsule by mouth daily.   Yes [provider]  diltiazem (CARDIZEM SR) 90 MG 12 hr  capsule Take 1 capsule (90 mg total) by mouth 2 (two) times daily. 01/06/23  Yes Patwardhan, Manish J, MD  docusate sodium (COLACE) 100 MG capsule Take 1 capsule (100 mg total) by mouth 2 (two) times daily. Patient taking differently: Take 100 mg by mouth in the morning. 10/21/22  Yes Corwin Levins, MD  glimepiride (AMARYL) 2 MG tablet Take 4 mg by mouth daily. 02/05/23  Yes [provider]  LASIX 20 MG tablet Take 40 mg by mouth daily. 09/15/22  Yes [provider]  pantoprazole (PROTONIX) 40 MG tablet TAKE 1 TABLET BY MOUTH EVERY DAY 04/12/22  Yes Corwin Levins, MD  predniSONE (DELTASONE) 20 MG tablet Take 20 mg by mouth daily with breakfast.   Yes [provider]  sertraline (ZOLOFT) 25 MG tablet TAKE 1 TABLET (25 MG TOTAL) BY MOUTH DAILY. 01/24/23 04/24/23 Yes Corwin Levins, MD  sotalol (BETAPACE) 80 MG tablet Take 0.5 tablets (40 mg  total) by mouth 2 (two) times daily. 01/06/23  Yes Patwardhan, Manish J, MD  triamcinolone cream (KENALOG) 0.5 % Apply 1 Application topically 2 (two) times daily as needed. Patient taking differently: Apply 1 Application topically as needed (For cellulitis). 06/29/22  Yes Corwin Levins, MD  amitriptyline (ELAVIL) 50 MG tablet Take 1 tablet (50 mg total) by mouth at bedtime as needed for sleep. And neuropathy Patient not taking: Reported on 04/13/2023 08/21/22   Zigmund Daniel., MD  budesonide (PULMICORT) 0.5 MG/2ML nebulizer solution Take by nebulization 2 (two) times daily. Patient not taking: Reported on 04/13/2023 04/04/23   [provider]  ciprofloxacin (CIPRO) 250 MG tablet Take 250 mg by mouth 2 (two) times daily. Patient not taking: Reported on 04/13/2023 04/04/23   [provider]  cyanocobalamin (VITAMIN B12) 1000 MCG/ML injection INJECT 1 ML INTO MUSCLE EVERY 30 DAYS Patient not taking: Reported on 04/13/2023 05/11/22   Corwin Levins, MD  memantine (NAMENDA) 10 MG tablet Take 1 tablet (10 mg total) by mouth 2  (two) times daily. This is the final dose, follow instructions provided verbally and in after visit summary (AVS in MyChart). Patient not taking: Reported on 04/13/2023 01/13/23   Huston Foley, MD  metFORMIN (GLUCOPHAGE) 500 MG tablet Take 1 tablet (500 mg total) by mouth daily with breakfast. Patient not taking: Reported on 02/15/2023 01/14/23 04/14/23  Chilton Greathouse, MD  metolazone (ZAROXOLYN) 2.5 MG tablet Take 2.5 mg by mouth daily. Patient not taking: Reported on 04/13/2023 04/04/23   [provider]  predniSONE (DELTASONE) 5 MG tablet Take 15 mg by mouth daily. Patient not taking: Reported on 04/13/2023 04/04/23   [provider]    Physical Exam: Vitals:   04/13/23 0953 04/13/23 0956 04/13/23 1348  BP:  (!) 120/45   Pulse: 64    Resp:  17   Temp: (!) 97.5 F (36.4 C)  98 F (36.7 C)  TempSrc: Oral    SpO2:  97%     Constitutional: Chronically ill appearing female who is able to follow commands Eyes: PERRL, lids and conjunctivae normal ENMT: Mucous membranes are moist.  There dentition.  Neck: normal, supple,   Respiratory: Normal respiratory effort with crackles appreciated on 4 L of nasal cannula oxygen with O2 saturations maintained. Cardiovascular: Bradycardic.  At least 1+ pitting lower extremity edema.   Abdomen: no tenderness, no masses palpated.  Bowel sounds positive.  Musculoskeletal: no clubbing / cyanosis. No joint deformity upper and lower extremities.  Normal muscle tone.  Skin: Bruising noted of the left leg and bilateral arms.  Sacral ulcer present.  Skin tear of the left forearm currently bandaged.. Neurologic: CN 2-12 grossly intact.  Strength 4/5 in upper extremities 3/3 in the lower extremities. Psychiatric: Alert and oriented only to self.  Data Reviewed:  EKG reveals sinus bradycardia 53 bpm with QTc prolonged at 521.  Reviewed labs, imaging, pertinent records as documented.  Assessment and Plan:  Suspected syncopal  episode Hypotension Acute.  Patient reportedly passed out while being sat up on the side of the bed by her son this morning.  History of that the patient was out on the 3 minutes prior to coming to.  No seizure activity was reported.  Blood pressures on admission were noted to be as low as 101/48.  She is on chronic steroids.  She is also bedbound, but on anticoagulation for which have lower suspicion for DVT.    -Admit to a telemetry bed -Goal MAP  greater than 65 -Check D-dimer -Hold home blood pressure regimen.  Reassess when medically appropriate to resume. -Hydrocortisone 100 mg IV q8 hr -Check echocardiogram -Follow-up telemetry  Urinary tract infection Prior to arrival.  Patient complaining of some lower pain.  She had just completed ciprofloxacin 2 days ago for urinary tract infection.  Urinalysis noted large leukocytes, moderate hemoglobin, few bacteria, 11-20 WBCs.  Records note history of ESBL urinary tract infections sensitive only to meropenem. -Check urine culture -Meropenem per pharmacy  Leukocytosis Acute.  WBC elevated at 14.1.  No other sepsis criteria met at this time.  Suspect secondary to possible urinary tract infection. -Recheck CBC tomorrow morning  Heart failure with mildly reduced ejection fraction Acute on chronic.  Patient noted to have lower extremity swelling.  BNP was elevated at 496.5 which is higher than priors.  Last TEE noted EF to be 50-55% back on 08/18/2022.  Patient had been ordered Lasix 20 mg IV x 1 dose at the ED for primary. -Strict I&O's and daily weights  -Reassess and determine when appropriate to continue diuresis  Elevated troponin Acute.  Patient does not report any complaints of chest pain at this time.  High-sensitivity troponins elevated at 96.  Suspect secondary to demand as patient seems to be fluid overloaded. -Follow-up repeat troponin  Paroxysmal atrial fibrillation on chronic coagulation Patient appears to be in sinus rhythm and  bradycardic. -Continue Eliquis -Resume sotalol  and Cardizem once able  Interstitial lung disease Chronic respiratory failure with hypoxia Patient is on 4 L of oxygen at baseline and has chronic cough..  She is on chronic prednisone since September 2023.  They have tried weaning patient off previously, but had been unsuccessful. -Continuous pulse oximetry with nasal cannula oxygen maintain O2 saturation greater than 92% -Resume oral prednisone when medically appropriate  Diabetes mellitus type 2, without long-term use of insulin At home patient's blood sugars have been as low as 93.  Last hemoglobin A1c was 7.6 on 12/28/2022. -Hypoglycemic protocols -Hold glimepiride -CBGs before every meal with very sensitive -Adjust regimen as needed  Dementia Patient had previously been on Namenda but this medication was discontinued. -Delirium precautions  Depression -Continue amitriptyline  and sertraline  Pressure ulcer of the sacrum -Low-air-loss mattress replacement  Morbid obesity BMI 41.6 kg/m  Hospice Patient is on hospice at home.  Family recommend keeping DNR in place.  DVT prophylaxis: Eliquis  Advance Care Planning:   Code Status: Do not attempt resuscitation (DNR) PRE-ARREST INTERVENTIONS DESIRED    Consults: None Family Communication: Family updated at bedside  Severity of Illness: The appropriate patient status for this patient is OBSERVATION. Observation status is judged to be reasonable and necessary in order to provide the required intensity of service to ensure the patient's safety. The patient's presenting symptoms, physical exam findings, and initial radiographic and laboratory data in the context of their medical condition is felt to place them at decreased risk for further clinical deterioration. Furthermore, it is anticipated that the patient will be medically stable for discharge from the hospital within 2 midnights of admission.   Author: Clydie Braun,  MD 04/13/2023 2:10 PM  For on call review [email or web address].

## 2023-04-13 NOTE — ED Provider Notes (Signed)
[place] EMERGENCY DEPARTMENT AT [facility] Provider Note   CSN: [id number] Arrival date & time: 04/13/23  0944     History  Chief Complaint  Patient presents with   Loss of Consciousness    Kathy [name] is a 82 y.o. female with past medical history of diabetes (non insulin dependent), HTN, CKD, GERD, hypertension, ILD (with 2-3L O2 supplementation), dementia, a fib (on Eliquis) presents to emergency department via EMS for evaluation following syncopal episode.  Son reports that he was attempting to get her off commode when she had syncopal episode.  He was attempting to lift her up from the commode when she slumped down onto him so there was no injury or fall.  He reports that following syncope, she was at her baseline.  He denies seizure like activity, respiratory symptoms, fever, urinary symptoms to his knowledge  The history is provided by a relative and the EMS personnel. History limited by: dementia and baseline A&Ox1. Son not present upon HPI questioning.  Loss of Consciousness Associated symptoms: no chest pain, no dizziness, no fever, no headaches, no nausea, no palpitations, no seizures, no shortness of breath, no vomiting and no weakness       Home Medications Prior to Admission medications   Medication Sig Start Date End Date Taking? Authorizing Provider  albuterol (VENTOLIN HFA) 108 (90 Base) MCG/ACT inhaler Inhale 1-2 puffs into the lungs every 6 (six) hours as needed for wheezing or shortness of breath. Patient taking differently: Inhale 1-2 puffs into the lungs as needed for wheezing or shortness of breath. 10/21/22  Yes Corwin Levins, MD  amitriptyline (ELAVIL) 100 MG tablet Take 50 mg by mouth at bedtime.   Yes [provider]  apixaban (ELIQUIS) 5 MG TABS tablet Take 1 tablet (5 mg total) by mouth 2 (two) times daily. 01/06/23  Yes Patwardhan, Manish J, MD  Cholecalciferol (VITAMIN D-3) 25 MCG (1000 UT) CAPS Take 1 capsule by mouth daily.   Yes  [provider]  diltiazem (CARDIZEM SR) 90 MG 12 hr capsule Take 1 capsule (90 mg total) by mouth 2 (two) times daily. 01/06/23  Yes Patwardhan, Manish J, MD  docusate sodium (COLACE) 100 MG capsule Take 1 capsule (100 mg total) by mouth 2 (two) times daily. Patient taking differently: Take 100 mg by mouth in the morning. 10/21/22  Yes Corwin Levins, MD  glimepiride (AMARYL) 2 MG tablet Take 4 mg by mouth daily. 02/05/23  Yes [provider]  LASIX 20 MG tablet Take 40 mg by mouth daily. 09/15/22  Yes [provider]  pantoprazole (PROTONIX) 40 MG tablet TAKE 1 TABLET BY MOUTH EVERY DAY 04/12/22  Yes Corwin Levins, MD  predniSONE (DELTASONE) 20 MG tablet Take 20 mg by mouth daily with breakfast.   Yes [provider]  sertraline (ZOLOFT) 25 MG tablet TAKE 1 TABLET (25 MG TOTAL) BY MOUTH DAILY. 01/24/23 04/24/23 Yes Corwin Levins, MD  sotalol (BETAPACE) 80 MG tablet Take 0.5 tablets (40 mg total) by mouth 2 (two) times daily. 01/06/23  Yes Patwardhan, Manish J, MD  triamcinolone cream (KENALOG) 0.5 % Apply 1 Application topically 2 (two) times daily as needed. Patient taking differently: Apply 1 Application topically as needed (For cellulitis). 06/29/22  Yes Corwin Levins, MD  amitriptyline (ELAVIL) 50 MG tablet Take 1 tablet (50 mg total) by mouth at bedtime as needed for sleep. And neuropathy Patient not taking: Reported on 04/13/2023 08/21/22   Zigmund Daniel.,  MD  budesonide (PULMICORT) 0.5 MG/2ML nebulizer solution Take by nebulization 2 (two) times daily. Patient not taking: Reported on 04/13/2023 04/04/23   [provider]  ciprofloxacin (CIPRO) 250 MG tablet Take 250 mg by mouth 2 (two) times daily. Patient not taking: Reported on 04/13/2023 04/04/23   [provider]  cyanocobalamin (VITAMIN B12) 1000 MCG/ML injection INJECT 1 ML INTO MUSCLE EVERY 30 DAYS Patient not taking: Reported on 04/13/2023 05/11/22   Corwin Levins, MD  memantine  (NAMENDA) 10 MG tablet Take 1 tablet (10 mg total) by mouth 2 (two) times daily. This is the final dose, follow instructions provided verbally and in after visit summary (AVS in MyChart). Patient not taking: Reported on 04/13/2023 01/13/23   Huston Foley, MD  metFORMIN (GLUCOPHAGE) 500 MG tablet Take 1 tablet (500 mg total) by mouth daily with breakfast. Patient not taking: Reported on 02/15/2023 01/14/23 04/14/23  Chilton Greathouse, MD  metolazone (ZAROXOLYN) 2.5 MG tablet Take 2.5 mg by mouth daily. Patient not taking: Reported on 04/13/2023 04/04/23   [provider]  predniSONE (DELTASONE) 5 MG tablet Take 15 mg by mouth daily. Patient not taking: Reported on 04/13/2023 04/04/23   [provider]      Allergies    Lipitor [atorvastatin] and Requip [ropinirole hcl]    Review of Systems   Review of Systems  Constitutional:  Negative for chills, fatigue and fever.  Respiratory:  Negative for cough, chest tightness, shortness of breath and wheezing.   Cardiovascular:  Positive for syncope. Negative for chest pain and palpitations.  Gastrointestinal:  Negative for abdominal pain, constipation, diarrhea, nausea and vomiting.  Neurological:  Negative for dizziness, seizures, weakness, light-headedness, numbness and headaches.    Physical Exam Updated Vital Signs BP (!) 120/45   Pulse 64   Temp 98 F (36.7 C)   Resp 17   Wt 96.6 kg Comment: from 02/15/23  SpO2 97%   BMI 41.60 kg/m  Physical Exam Vitals and nursing note reviewed.  Constitutional:      General: She is not in acute distress.    Appearance: Normal appearance. She is not ill-appearing.  HENT:     Head: Normocephalic and atraumatic.  Eyes:     Conjunctiva/sclera: Conjunctivae normal.  Cardiovascular:     Rate and Rhythm: Normal rate. Rhythm irregular.  Pulmonary:     Effort: Pulmonary effort is normal. No respiratory distress.  Musculoskeletal:     Cervical back: Normal range of motion and neck supple.  No tenderness.  Skin:    Capillary Refill: Capillary refill takes less than 2 seconds.     Coloration: Skin is not jaundiced or pale.  Neurological:     Mental Status: She is alert. Mental status is at baseline.     Comments: Dementia and is at baseline per son (A&Ox1) Neurological exam limited d/t dementia and inability to follow commands consistently     ED Results / Procedures / Treatments   Labs (all labs ordered are listed, but only abnormal results are displayed) Labs Reviewed  CBC WITH DIFFERENTIAL/PLATELET - Abnormal; Notable for the following components:      Result Value   WBC 14.1 (*)    RBC 3.79 (*)    nRBC 0.3 (*)    Neutro Abs 10.5 (*)    Abs Immature Granulocytes 0.34 (*)    All other components within normal limits  URINALYSIS, ROUTINE W REFLEX MICROSCOPIC - Abnormal; Notable for the following components:   APPearance HAZY (*)  Hgb urine dipstick MODERATE (*)    Leukocytes,Ua LARGE (*)    Bacteria, UA FEW (*)    All other components within normal limits  BRAIN NATRIURETIC PEPTIDE - Abnormal; Notable for the following components:   B Natriuretic Peptide 496.5 (*)    All other components within normal limits  TROPONIN I (HIGH SENSITIVITY) - Abnormal; Notable for the following components:   Troponin I (High Sensitivity) 96 (*)    All other components within normal limits  MAGNESIUM  COMPREHENSIVE METABOLIC PANEL  TROPONIN I (HIGH SENSITIVITY)    EKG EKG Interpretation Date/Time:  Wednesday April 13 2023 10:39:10 EDT Ventricular Rate:  53 PR Interval:  183 QRS Duration:  98 QT Interval:  554 QTC Calculation: 521 R Axis:   -18  Text Interpretation: Sinus bradycardia Atrial premature complexes Consider right ventricular hypertrophy LVH with secondary repolarization abnormality Inferior infarct, old Anterior Q waves, possibly due to LVH Prolonged QT interval Confirmed by Ernie Avena ([id number]) on 04/13/2023 11:01:57 AM  Radiology DG Chest Portable 1  View  Result Date: 04/13/2023 CLINICAL DATA:  Syncope. EXAM: PORTABLE CHEST 1 VIEW COMPARISON:  Chest radiographs and CT 01/03/2023 FINDINGS: The cardiomediastinal silhouette is unchanged with normal heart size. Aortic atherosclerosis is noted. Lung volumes remain severely reduced with asymmetric elevation of the right hemidiaphragm. There are chronic, coarse bilateral interstitial densities. No definite acute airspace consolidation, overt pulmonary edema, sizable pleural effusion, or pneumothorax is identified. No acute osseous abnormality is seen. IMPRESSION: Chronic interstitial lung disease without definite acute finding. Electronically Signed   By: Sebastian Ache M.D.   On: 04/13/2023 13:53    Procedures Procedures    Medications Ordered in ED Medications  furosemide (LASIX) injection 20 mg (has no administration in time range)  meropenem (MERREM) 1 g in sodium chloride 0.9 % 100 mL IVPB (has no administration in time range)    ED Course/ Medical Decision Making/ A&P                                 Medical Decision Making    Patient presents to the ED for concern of syncopal episode, this involves an extensive number of treatment options, and is a complaint that carries with it a high risk of complications and morbidity.  The differential diagnosis includes electrolyte abnormality, infection, arrhythmia, dehydration.  This is not an exhaustive list   Co morbidities that complicate the patient evaluation  Dementia   Additional history obtained:  Additional history obtained from EMS, Family, Nursing, Outside Medical Records, and Past Admission   External records from outside source obtained upon chart review   Lab Tests:  I Ordered, and personally interpreted labs (CBC, CMP, UA).  The pertinent results include: Leukocytosis 14.1, UA significant for UTI, elevated first troponin of 96 that is different from baseline which is normally within normal limits     Imaging Studies  ordered:  I ordered imaging studies including CXR  I independently visualized and interpreted imaging which showed Chronic ILD without acute lung finding I agree with the radiologist interpretation   Cardiac Monitoring:  The patient was maintained on a cardiac monitor.  I personally viewed and interpreted the cardiac monitored which showed an underlying rhythm of: NSR with PACs. No STE noted   Medicines ordered and prescription drug management:  I ordered medication including lasix  for fluid overload Reevaluation of the patient after these medicines showed that the patient stayed the  same I have reviewed the patients home medicines and have made adjustments as needed    Consultations Obtained:  I requested consultation with the hospitalist Madelyn Flavors MD),  and discussed lab and imaging findings as well as pertinent plan - they recommend: admission for elevated troponin, syncopal episode, and UTI (culture significant for ESBL)   Problem List / ED Course:  Syncope, unspecified syncope type Due to ESBL   Reevaluation:  After the interventions noted above, I reevaluated the patient and found that they have :stayed the same   Social Determinants of Health:  Patient's family reports that patient has become progressively altered and difficult to take care of at home over the past month.  They are interested in skilled nursing facility placement following admission for acute syncope, elevated troponin, UTI   Dispostion:  Patient is resting comfortably in bed.  She is not coherent and is unable to have conversation.  Vital signs WNL and she is hemodynamic stable.  See HPI.  Physical exam is significant for 2+ pitting edema bilaterally.   She has positive leukocytosis with UA significant for UTI. Troponin was elevated at 96 from baseline which is normally WNL.  BNP is 496 elevated from baseline of 150 and likely cause of elevated troponin. Will provide 20mg  Lasix for fluid  overload.  Patient's family reports that patient lost hospice following coming to ED for evaluation.  They are interested in skilled nursing facility as long-term management of the patient as she is becoming more difficult to take care of at home.  Will admit to manage acute conditions of UTI, leukocytosis, syncope, elevated troponin and BMP then look for SNF placement.  Rocephin given for UTI.  Eugenie Filler MD , hospitalist accepts patient for admission.  After consideration of the diagnostic results and the patients response to treatment, I feel that the patent would benefit from admission for acute symptoms then long term placement in SNF. Explained findings, disposition with family who reports understanding and agrees with plan.  Discussed plan with Dr. Karene Fry who agrees with plan. He discussed plan with patient.        Final Clinical Impression(s) / ED Diagnoses Final diagnoses:  Syncope, unspecified syncope type  UTI due to extended-spectrum beta lactamase (ESBL) producing Escherichia coli    Rx / DC Orders ED Discharge Orders     None         Judithann Sheen, PA 04/13/23 1448    Ernie Avena, MD 04/13/23 1636

## 2023-04-13 NOTE — Plan of Care (Signed)

## 2023-04-13 NOTE — ED Triage Notes (Signed)
PT BIB EMS from home, for a syncopal episode while being assisted to the bedside commode this morning.  Per son, patient is back at baseline, and was at baseline immediately following this syncopal event.  Pt is a DNR, hospice patient, dementia at baseline.   112/78 HR 60 O2 97% on 4L [place] -- baseline

## 2023-04-14 ENCOUNTER — Observation Stay (HOSPITAL_COMMUNITY): Payer: Medicare Other

## 2023-04-14 DIAGNOSIS — Z789 Other specified health status: Secondary | ICD-10-CM | POA: Diagnosis not present

## 2023-04-14 DIAGNOSIS — Z66 Do not resuscitate: Secondary | ICD-10-CM

## 2023-04-14 DIAGNOSIS — E2740 Unspecified adrenocortical insufficiency: Secondary | ICD-10-CM | POA: Diagnosis present

## 2023-04-14 DIAGNOSIS — B9620 Unspecified Escherichia coli [E. coli] as the cause of diseases classified elsewhere: Secondary | ICD-10-CM | POA: Diagnosis present

## 2023-04-14 DIAGNOSIS — F03B4 Unspecified dementia, moderate, with anxiety: Secondary | ICD-10-CM | POA: Diagnosis present

## 2023-04-14 DIAGNOSIS — E785 Hyperlipidemia, unspecified: Secondary | ICD-10-CM | POA: Diagnosis not present

## 2023-04-14 DIAGNOSIS — G9341 Metabolic encephalopathy: Secondary | ICD-10-CM | POA: Diagnosis present

## 2023-04-14 DIAGNOSIS — Z7401 Bed confinement status: Secondary | ICD-10-CM | POA: Diagnosis not present

## 2023-04-14 DIAGNOSIS — Z91199 Patient's noncompliance with other medical treatment and regimen due to unspecified reason: Secondary | ICD-10-CM | POA: Diagnosis not present

## 2023-04-14 DIAGNOSIS — Z1612 Extended spectrum beta lactamase (ESBL) resistance: Secondary | ICD-10-CM | POA: Diagnosis present

## 2023-04-14 DIAGNOSIS — E1040 Type 1 diabetes mellitus with diabetic neuropathy, unspecified: Secondary | ICD-10-CM | POA: Diagnosis not present

## 2023-04-14 DIAGNOSIS — F0390 Unspecified dementia without behavioral disturbance: Secondary | ICD-10-CM | POA: Diagnosis not present

## 2023-04-14 DIAGNOSIS — E876 Hypokalemia: Secondary | ICD-10-CM

## 2023-04-14 DIAGNOSIS — Z6841 Body Mass Index (BMI) 40.0 and over, adult: Secondary | ICD-10-CM | POA: Diagnosis not present

## 2023-04-14 DIAGNOSIS — J9611 Chronic respiratory failure with hypoxia: Secondary | ICD-10-CM | POA: Diagnosis present

## 2023-04-14 DIAGNOSIS — I5022 Chronic systolic (congestive) heart failure: Secondary | ICD-10-CM | POA: Diagnosis present

## 2023-04-14 DIAGNOSIS — Z515 Encounter for palliative care: Secondary | ICD-10-CM | POA: Diagnosis not present

## 2023-04-14 DIAGNOSIS — I130 Hypertensive heart and chronic kidney disease with heart failure and stage 1 through stage 4 chronic kidney disease, or unspecified chronic kidney disease: Secondary | ICD-10-CM | POA: Diagnosis present

## 2023-04-14 DIAGNOSIS — F32A Depression, unspecified: Secondary | ICD-10-CM | POA: Diagnosis present

## 2023-04-14 DIAGNOSIS — F03B11 Unspecified dementia, moderate, with agitation: Secondary | ICD-10-CM | POA: Diagnosis not present

## 2023-04-14 DIAGNOSIS — Z7189 Other specified counseling: Secondary | ICD-10-CM | POA: Diagnosis not present

## 2023-04-14 DIAGNOSIS — K219 Gastro-esophageal reflux disease without esophagitis: Secondary | ICD-10-CM | POA: Diagnosis not present

## 2023-04-14 DIAGNOSIS — I480 Paroxysmal atrial fibrillation: Secondary | ICD-10-CM | POA: Diagnosis present

## 2023-04-14 DIAGNOSIS — I2489 Other forms of acute ischemic heart disease: Secondary | ICD-10-CM | POA: Diagnosis present

## 2023-04-14 DIAGNOSIS — F03B3 Unspecified dementia, moderate, with mood disturbance: Secondary | ICD-10-CM | POA: Diagnosis present

## 2023-04-14 DIAGNOSIS — R404 Transient alteration of awareness: Secondary | ICD-10-CM | POA: Diagnosis not present

## 2023-04-14 DIAGNOSIS — R627 Adult failure to thrive: Secondary | ICD-10-CM | POA: Diagnosis present

## 2023-04-14 DIAGNOSIS — Z8616 Personal history of COVID-19: Secondary | ICD-10-CM | POA: Diagnosis not present

## 2023-04-14 DIAGNOSIS — R638 Other symptoms and signs concerning food and fluid intake: Secondary | ICD-10-CM | POA: Diagnosis not present

## 2023-04-14 DIAGNOSIS — N1831 Chronic kidney disease, stage 3a: Secondary | ICD-10-CM | POA: Diagnosis present

## 2023-04-14 DIAGNOSIS — I10 Essential (primary) hypertension: Secondary | ICD-10-CM | POA: Diagnosis not present

## 2023-04-14 DIAGNOSIS — N179 Acute kidney failure, unspecified: Secondary | ICD-10-CM | POA: Diagnosis not present

## 2023-04-14 DIAGNOSIS — E860 Dehydration: Secondary | ICD-10-CM

## 2023-04-14 DIAGNOSIS — N170 Acute kidney failure with tubular necrosis: Secondary | ICD-10-CM | POA: Diagnosis present

## 2023-04-14 DIAGNOSIS — N390 Urinary tract infection, site not specified: Secondary | ICD-10-CM | POA: Diagnosis not present

## 2023-04-14 DIAGNOSIS — E1122 Type 2 diabetes mellitus with diabetic chronic kidney disease: Secondary | ICD-10-CM | POA: Diagnosis present

## 2023-04-14 DIAGNOSIS — N189 Chronic kidney disease, unspecified: Secondary | ICD-10-CM | POA: Diagnosis not present

## 2023-04-14 DIAGNOSIS — J849 Interstitial pulmonary disease, unspecified: Secondary | ICD-10-CM | POA: Diagnosis present

## 2023-04-14 DIAGNOSIS — R4182 Altered mental status, unspecified: Secondary | ICD-10-CM | POA: Diagnosis not present

## 2023-04-14 DIAGNOSIS — R55 Syncope and collapse: Secondary | ICD-10-CM | POA: Diagnosis not present

## 2023-04-14 DIAGNOSIS — K429 Umbilical hernia without obstruction or gangrene: Secondary | ICD-10-CM | POA: Diagnosis not present

## 2023-04-14 DIAGNOSIS — B9629 Other Escherichia coli [E. coli] as the cause of diseases classified elsewhere: Secondary | ICD-10-CM | POA: Diagnosis not present

## 2023-04-14 DIAGNOSIS — L89329 Pressure ulcer of left buttock, unspecified stage: Secondary | ICD-10-CM | POA: Diagnosis present

## 2023-04-14 LAB — GLUCOSE, CAPILLARY
Glucose-Capillary: 155 mg/dL — ABNORMAL HIGH (ref 70–99)
Glucose-Capillary: 235 mg/dL — ABNORMAL HIGH (ref 70–99)
Glucose-Capillary: 137 mg/dL — ABNORMAL HIGH (ref 70–99)
Glucose-Capillary: 180 mg/dL — ABNORMAL HIGH (ref 70–99)

## 2023-04-14 LAB — CBC
HCT: 38.2 % (ref 36.0–46.0)
Hemoglobin: 12.9 g/dL (ref 12.0–15.0)
MCH: 33.6 pg (ref 26.0–34.0)
MCHC: 33.8 g/dL (ref 30.0–36.0)
MCV: 99.5 fL (ref 80.0–100.0)
Platelets: 153 10*3/uL (ref 150–400)
RBC: 3.84 MIL/uL — ABNORMAL LOW (ref 3.87–5.11)
RDW: 13.7 % (ref 11.5–15.5)
WBC: 12.9 10*3/uL — ABNORMAL HIGH (ref 4.0–10.5)
nRBC: 0.2 % (ref 0.0–0.2)

## 2023-04-14 LAB — BASIC METABOLIC PANEL
Anion gap: 21 — ABNORMAL HIGH (ref 5–15)
BUN: 54 mg/dL — ABNORMAL HIGH (ref 8–23)
CO2: 38 mmol/L — ABNORMAL HIGH (ref 22–32)
Calcium: 8.7 mg/dL — ABNORMAL LOW (ref 8.9–10.3)
Chloride: 77 mmol/L — ABNORMAL LOW (ref 98–111)
Creatinine, Ser: 2.13 mg/dL — ABNORMAL HIGH (ref 0.44–1.00)
GFR, Estimated: 23 mL/min — ABNORMAL LOW (ref >60)
Glucose, Bld: 159 mg/dL — ABNORMAL HIGH (ref 70–99)
Potassium: 3.3 mmol/L — ABNORMAL LOW (ref 3.5–5.1)
Sodium: 136 mmol/L (ref 135–145)

## 2023-04-14 LAB — ECHOCARDIOGRAM COMPLETE
Area-P 1/2: 3.48 cm2
Height: 60 in
S' Lateral: 2.3 cm
Weight: 3408 [oz_av]

## 2023-04-14 MED ORDER — POTASSIUM CHLORIDE 20 MEQ PO PACK
40.0000 meq | PACK | Freq: Once | ORAL | Status: AC
  Administered 2023-04-14: 40 meq via ORAL
  Filled 2023-04-14: qty 2

## 2023-04-14 MED ORDER — SODIUM CHLORIDE 0.9 % IV SOLN
500.0000 mg | Freq: Two times a day (BID) | INTRAVENOUS | Status: DC
  Administered 2023-04-15: 500 mg via INTRAVENOUS
  Filled 2023-04-14 (×2): qty 10

## 2023-04-14 MED ORDER — KCL IN DEXTROSE-NACL 20-5-0.45 MEQ/L-%-% IV SOLN
INTRAVENOUS | Status: AC
  Filled 2023-04-14: qty 1000

## 2023-04-14 MED ORDER — PANTOPRAZOLE SODIUM 40 MG PO TBEC
40.0000 mg | DELAYED_RELEASE_TABLET | Freq: Every day | ORAL | Status: DC
  Administered 2023-04-14 – 2023-04-15 (×2): 40 mg via ORAL
  Filled 2023-04-14 (×2): qty 1

## 2023-04-14 NOTE — Progress Notes (Signed)
  Echocardiogram 2D Echocardiogram has been performed.  Janalyn Harder 04/14/2023, 2:01 PM

## 2023-04-14 NOTE — Plan of Care (Signed)

## 2023-04-14 NOTE — Evaluation (Addendum)
Physical Therapy Evaluation Patient Details Name: Kathy [name] MRN: [id number] DOB: October 16, 1940 Today's Date: 04/14/2023  History of Present Illness  82 y.o. female admitted 10/16 with LOC. 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 coli, dementia, endometrial CA, neuropathy  Clinical Impression  Pt with IV pulled out and incontinent on arrival. No family present with pt tangential and talkative. Pt with waxing/waning periods of alertness, will be talking then just close her eyes and need cues to arouse. Pt able to transition to sitting with max +2 assist and stand with mod +2. Contacted family via phone who state pt did not participate in therapy at SNF [facility]) and was discharged in April with progressive decline and requires 2 person assist for pivot to chair/toilet with family no longer able to provide physical assist required and spouse with cognitive deficits himself and resistant to use of lift. Pt would benefit from custodial care and hoyer lift. Will trial acute therapy to see if pt able to participate and progress.    Supine 129/56 (75), 82 Sitting 115/59 (73), HR 80 - unable to get reading in standing      If plan is discharge home, recommend the following: Two people to help with walking and/or transfers   Can travel by private vehicle        Equipment Recommendations [place] lift;Hospital bed  Recommendations for Other Services       Functional Status Assessment Patient has had a recent decline in their functional status and/or demonstrates limited ability to make significant improvements in function in a reasonable and predictable amount of time     Precautions / Restrictions Precautions Precautions: Fall;Other (comment) Precaution Comments: incontinent      Mobility  Bed Mobility Overal bed mobility: Needs Assistance Bed Mobility: Rolling, Supine to Sit, Sit to Supine Rolling: Max assist    Supine to sit: Max assist, +2 for physical assistance Sit to supine: Max assist, +2 for physical assistance   General bed mobility comments: max assist to roll bil for pericare x 2. With [facility] elevated 35 degrees max +2 to pivot to EOB with pad with pt able to maintain static sitting with CGA. Max +2 to return to supine, total +2 to slide toward [facility]    Transfers Overall transfer level: Needs assistance   Transfers: Sit to/from Stand Sit to Stand: Mod assist, +2 physical assistance           General transfer comment: mod +2 with assist of pad at sacrum to stand x 3 from EOB with slight elevation, mod cues for sequence and hand placement. pt unable to achieve fully upright posture    Ambulation/Gait                  Stairs            Wheelchair Mobility     Tilt Bed    Modified Rankin (Stroke Patients Only)       Balance Overall balance assessment: Needs assistance Sitting-balance support: Feet supported Sitting balance-Leahy Scale: Fair Sitting balance - Comments: EOB with CGA   Standing balance support: Bilateral upper extremity supported, Reliant on assistive device for balance Standing balance-Leahy Scale: Poor                               Pertinent Vitals/Pain Pain Assessment Pain Assessment: PAINAD Breathing: normal Negative Vocalization: none Facial  Expression: smiling or inexpressive Body Language: relaxed Consolability: no need to console PAINAD Score: 0    Home Living Family/patient expects to be discharged to:: Private residence Living Arrangements: Spouse/significant other Available Help at Discharge: Family;Available 24 hours/day Type of Home: House Home Access: Level entry       Home Layout: Two level;Able to live on main level with bedroom/bathroom Home Equipment: Shower seat - built in;Rollator (4 wheels);Wheelchair - manual;Cane - single point;Hospital bed Additional Comments: spouse and family physically  helping to move pt to [facility] to roll to bathroom as pt refuses to use [facility] and spouse refuses hoyer lift    Prior Function Prior Level of Function : Needs assist       Physical Assist : Mobility (physical);ADLs (physical) Mobility (physical): Bed mobility;Transfers ADLs (physical): Feeding;Grooming;Bathing;Dressing;Toileting Mobility Comments: max +2 to pivot to WC and toilet, increased struggle to get pt to move for the week PTA ADLs Comments: max assist for all     Extremity/Trunk Assessment   Upper Extremity Assessment Upper Extremity Assessment: Defer to OT evaluation    Lower Extremity Assessment Lower Extremity Assessment: Generalized weakness (pt reports pain with bil knee flexion)    Cervical / Trunk Assessment Cervical / Trunk Assessment: Kyphotic  Communication   Communication Communication: No apparent difficulties  Cognition Arousal: Alert Behavior During Therapy: Restless Overall Cognitive Status: No family/caregiver present to determine baseline cognitive functioning                                 General Comments: pt with baseline dementia, pulled IV out on arrival, tangential talking about tim and various subjects, following commands grossly 20% of the time        General Comments      Exercises     Assessment/Plan    PT Assessment Patient needs continued PT services  PT Problem List Decreased strength;Decreased mobility;Decreased safety awareness;Decreased activity tolerance;Decreased cognition;Decreased balance       PT Treatment Interventions DME instruction;Functional mobility training;Therapeutic activities;Patient/family education;Cognitive remediation;Balance training;Therapeutic exercise;Gait training    PT Goals (Current goals can be found in the Care Plan section)  Acute Rehab PT Goals PT Goal Formulation: Patient unable to participate in goal setting Time For Goal Achievement: 04/28/23 Potential to Achieve Goals: Fair     Frequency Min 1X/week     Co-evaluation               AM-PAC PT "6 Clicks" Mobility  Outcome Measure Help needed turning from your back to your side while in a flat bed without using bedrails?: A Lot Help needed moving from lying on your back to sitting on the side of a flat bed without using bedrails?: A Lot Help needed moving to and from a bed to a chair (including a wheelchair)?: A Lot Help needed standing up from a chair using your arms (e.g., wheelchair or bedside chair)?: Total Help needed to walk in hospital room?: Total Help needed climbing 3-5 steps with a railing? : Total 6 Click Score: 9    End of Session   Activity Tolerance: Patient tolerated treatment well Patient left: in bed;with bed alarm set;with call bell/phone within reach Nurse Communication: Mobility status;Other (comment) (need for foot board on bed) PT Visit Diagnosis: Other abnormalities of gait and mobility (R26.89);Muscle weakness (generalized) (M62.81)    Time: 1324-4010 PT Time Calculation (min) (ACUTE ONLY): 36 min   Charges:   PT Evaluation $PT Eval  Moderate Complexity: 1 Mod   PT General Charges $$ ACUTE PT VISIT: 1 Visit         Merryl Hacker, PT Acute Rehabilitation Services Office: [phone]   Kathy [name] 04/14/2023, 11:36 AM

## 2023-04-14 NOTE — Consult Note (Signed)
Consultation Note Date: 04/14/2023   Patient Name: Kathy [name]  DOB: [birth year]  MRN: [id number]  Age / Sex: 82 y.o., female  PCP: Corwin Levins, MD Referring Physician: Elease Etienne, MD  Reason for Consultation: Establishing goals of care  HPI/Patient Profile: 82 y.o. female  with past medical history of moderate dementia, ambulatory dysfunction, PAF on Eliquis, stage IIIa CKD, ILD, chronic respiratory failure with hypoxia on 2-4 L/min [place] oxygen, type II DM, and recurrent UTIs with resistant E. coli  admitted on 04/13/2023 with several weeks of not eating or drinking much, syncope without seizure like activity, abdominal pain, chronic cough and altered mental status. Found to have AKI - creatinine 2.4, was normal in July. Suspected d/t dehydration, hypotension, and ?ATN. Patient was receiving hospice services at home prior to admission. PMT consulted to discuss GOC.   Clinical Assessment and Goals of Care: I have reviewed medical records including EPIC notes, labs and imaging,  assessed the patient and then spoke with patient's son Kathy [name]  to discuss diagnosis prognosis, GOC, EOL wishes, disposition and options.  Patient was not responsive during my time in the room. Spoke with husband briefly - he was a bit confused about several aspects of her care. Patient's daughter was outside room - patient's spouse reported daughter is here visiting from [place] - he tells me to call patient's son to discuss care.   I called son Kathy [name] and I introduced Palliative Medicine as specialized medical care for people living with serious illness. It focuses on providing relief from the symptoms and stress of a serious illness. The goal is to improve quality of life for both the patient and the family.  He tells me of a decline at home - patient bedbound with very poor appetite. We discuss she had been on  hospice care - he reports that their wishes remain consistent with  hospice care - to focus on comfort and avoid aggressive medical interventions, he does not feel she should've come to the hospital.    We discussed patient's current illness and what it means in the larger context of patient's on-going co-morbidities.  Natural disease trajectory and expectations at EOL were discussed.  We discuss if we should continue current medical care given wishes and desire to focus on comfort - Kathy [name] does not think we should - he thinks a transition to comfort measures only and a transition to a hospice facility would be most appropriate however he does request time to discuss this with his siblings.     Questions and concerns were addressed. The family was encouraged to call with questions or concerns.   Discussed with Dr. Waymon Amato and RN that family is discussing a transition to comfort care - may make this decision later today and request full comfort measures/hospice facility.   Primary Decision Maker NEXT OF KIN - spouse however decision making ability unclear? Has adult children as well who can assist with decision making    SUMMARY OF RECOMMENDATIONS   - family considering a transition to comfort measures only and transfer to hospice facility - they will let nursing staff know if that decision is made this evening  Code Status/Advance Care Planning: DNR     Primary Diagnoses: Present on Admission:  Syncope and collapse  Leukocytosis  Hypotension  UTI due to extended-spectrum beta lactamase (ESBL) producing Escherichia coli  ILD (interstitial lung disease) (HCC)  Chronic respiratory failure with hypoxia (HCC)  Atrial fibrillation (HCC)  Morbid (severe) obesity due to excess  calories (HCC)  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 The following aspects are  pertinent.  Past Medical History:  Diagnosis Date   Arthritis    fingers   CKD (chronic kidney disease) stage 3, GFR 30-59 ml/min (HCC) 11/30/2017   Depression    Dizziness    in AM, getting out of bed   Dysrhythmia    A fib   Endometrial ca (HCC) 11/30/2017   S/p surgury 1990's   Fall    GERD (gastroesophageal reflux disease) 11/30/2017   HLD (hyperlipidemia) 11/30/2017   Hypercholesteremia    Hypertension    Interstitial lung disease (HCC)    Neuropathy    bilateral feet   Pneumonia 12/14/2021   Shortness of breath dyspnea    Sleep apnea    has CPAP, doesn't use   Umbilical hernia    Social History   Socioeconomic History   Marital status: Married    Spouse name: Kathy [name]   Number of children: 3   Years of education: Not on file   Highest education level: Associate degree: occupational, Scientist, product/process development, or vocational program  Occupational History   Occupation: Retired  Tobacco Use   Smoking status: Former    Current packs/day: 0.00    Average packs/day: 1 pack/day for 10.0 years (10.0 ttl pk-yrs)    Types: Cigarettes    Start date: 06/28/1976    Quit date: 06/28/1986    Years since quitting: 36.8   Smokeless tobacco: Never   Tobacco comments:    quit 40+ yrs ago, 1 PPD for a few years  Vaping Use   Vaping status: Never Used  Substance and Sexual Activity   Alcohol use: No   Drug use: No   Sexual activity: Not on file  Other Topics Concern   Not on file  Social History Narrative   Lives with her husband   Right handed   Caffeine: very minimal    Social Determinants of Health   Financial Resource Strain: Low Risk  (02/15/2023)   Overall Financial Resource Strain (CARDIA)    Difficulty of Paying Living Expenses: Not hard at all  Food Insecurity: Patient Unable To Answer (04/13/2023)   Hunger Vital Sign    Worried About Programme researcher, broadcasting/film/video in the Last Year: Patient unable to answer    Ran Out of Food in the Last Year: Patient unable to answer  Transportation  Needs: Patient Unable To Answer (04/13/2023)   PRAPARE - Transportation    Lack of Transportation (Medical): Patient unable to answer    Lack of Transportation (Non-Medical): Patient unable to answer  Physical Activity: Inactive (02/15/2023)   Exercise Vital Sign    Days of Exercise per Week: 0 days    Minutes of Exercise per Session: 0 min  Stress: Stress Concern Present (02/15/2023)   [employer] of Occupational Health - Occupational Stress Questionnaire    Feeling of Stress : To some extent  Social Connections: Moderately Isolated (02/15/2023)   Social Connection and Isolation Panel [NHANES]    Frequency of Communication with Friends and Family: More than three times a week    Frequency of Social Gatherings with Friends and Family: More than three times a week    Attends Religious Services: Never    Database administrator or Organizations: No    Attends [employer]  or Organization Meetings: Never    Marital Status: Married   Family History  Problem Relation Age of Onset   Congestive Heart Failure Mother    Stroke Father    Parkinson's disease Father    Diabetes Son    Scheduled Meds:  amitriptyline  50 mg Oral QHS   apixaban  5 mg Oral BID   hydrocortisone sod succinate (SOLU-CORTEF) inj  100 mg Intravenous Q8H   insulin aspart  0-5 Units Subcutaneous QHS   insulin aspart  0-9 Units Subcutaneous TID WC   pantoprazole  40 mg Oral Daily   sertraline  25 mg Oral Daily   sodium chloride flush  3 mL Intravenous Q12H   Continuous Infusions:  dextrose 5 % and 0.45 % NaCl with KCl 20 mEq/L 75 mL/hr at 04/14/23 1528   meropenem (MERREM) IV 500 mg (04/14/23 1535)   PRN Meds:.acetaminophen **OR** acetaminophen, albuterol Allergies  Allergen Reactions   Lipitor [Atorvastatin] Other (See Comments)    Memory issues   Requip [Ropinirole Hcl] Other (See Comments)    Pt reports feeling generally unwell on this medication   Review of Systems  Unable to perform ROS: Patient unresponsive     Physical Exam Constitutional:      General: She is not in acute distress.    Appearance: She is ill-appearing.     Comments: Does not respond to voice or gentle touch  Pulmonary:     Effort: Pulmonary effort is normal.  Skin:    General: Skin is warm and dry.     Vital Signs: BP (!) 121/44 (BP Location: Right Arm)   Pulse 82   Temp 98.1 F (36.7 C) (Oral)   Resp 16   Ht 5' (1.524 m)   Wt 96.6 kg Comment: from 02/15/23  SpO2 95%   BMI 41.60 kg/m  Pain Scale: PAINAD   Pain Score: 0-No pain   SpO2: SpO2: 95 % O2 Device:SpO2: 95 % O2 Flow Rate: .O2 Flow Rate (L/min): 4 L/min  IO: Intake/output summary:  Intake/Output Summary (Last 24 hours) at 04/14/2023 1611 Last data filed at 04/14/2023 1000 Gross per 24 hour  Intake 60 ml  Output 200 ml  Net -140 ml    LBM: Last BM Date :  (UTA) Baseline Weight: Weight: 96.6 kg (from 02/15/23) Most recent weight: Weight: 96.6 kg (from 02/15/23)     Palliative Assessment/Data: PPS 20%     *Please note that this is a verbal dictation therefore any spelling or grammatical errors are due to the "[employer]agon [employer] One" system interpretation.   Time Total: 80 minutes  Time spent includes: Detailed review of medical records (labs, imaging, vital signs), medically appropriate exam, discussion with treatment team, counseling and educating patient, family and/or staff, documenting clinical information, medication management and coordination of care.    Gerlean Ren, DNP, AGNP-C Palliative Medicine Team [phone] Pager: [phone]

## 2023-04-14 NOTE — Evaluation (Signed)
Occupational Therapy Evaluation and Discharge Patient Details Name: Kathy [name] MRN: [id number] DOB: Nov 18, 1940 Today's Date: 04/14/2023   History of Present Illness 82 y.o. female admitted 10/16 with LOC. 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 coli, dementia, endometrial CA, neuropathy   Clinical Impression   Pt has been progressively declining for several months needing +2 assist for transfers and max to total assist for ADLs. Pt presents with significant cognitive impairment, pulled out lines prior to session and unaware of incontinence. Pt verbose with speech primarily non sensical. She follows commands infrequently. Pt currently requiring +2 assist for bed level mobility and sit to stand. She requires total assist for ADLs. Pt was receiving hospice care at home. Recommending SNF for custodial care as family can no longer care for pt at home at the level she requires. No further OT needs.       If plan is discharge home, recommend the following: Two people to help with walking and/or transfers;Two people to help with bathing/dressing/bathroom;Assistance with cooking/housework;Assistance with feeding;Direct supervision/assist for medications management;Direct supervision/assist for financial management;Assist for transportation;Help with stairs or ramp for entrance    Functional Status Assessment  Patient has not had a recent decline in their functional status  Equipment Recommendations  Hoyer lift;Hospital bed    Recommendations for Other Services       Precautions / Restrictions Precautions Precautions: Fall;Other (comment) Precaution Comments: incontinent Restrictions Weight Bearing Restrictions: No      Mobility Bed Mobility Overal bed mobility: Needs Assistance Bed Mobility: Rolling, Supine to Sit, Sit to Supine Rolling: Max assist   Supine to sit: Max assist, +2 for physical assistance Sit to  supine: Max assist, +2 for physical assistance   General bed mobility comments: max assist to roll bil for pericare x 2. With [facility] elevated 35 degrees max +2 to pivot to EOB with pad with pt able to maintain static sitting with CGA. Max +2 to return to supine, total +2 to slide toward [facility]    Transfers Overall transfer level: Needs assistance Equipment used: 2 person hand held assist Transfers: Sit to/from Stand Sit to Stand: Mod assist, +2 physical assistance           General transfer comment: mod +2 with assist of pad at sacrum to stand x 3 from EOB with slight elevation, mod cues for sequence and hand placement. pt unable to achieve fully upright posture      Balance Overall balance assessment: Needs assistance Sitting-balance support: Feet supported Sitting balance-Leahy Scale: Fair     Standing balance support: Bilateral upper extremity supported, Reliant on assistive device for balance Standing balance-Leahy Scale: Poor                             ADL either performed or assessed with clinical judgement   ADL Overall ADL's : At baseline                                       General ADL Comments: pt is dependent at baseline     Vision Ability to See in Adequate Light: 0 Adequate Patient Visual Report: No change from baseline       Perception         Praxis         Pertinent Vitals/Pain  Pain Assessment Pain Assessment: Faces Faces Pain Scale: Hurts little more Pain Location: knees, R>L Pain Descriptors / Indicators: Grimacing, Guarding, Moaning Pain Intervention(s): Repositioned, Limited activity within patient's tolerance     Extremity/Trunk Assessment Upper Extremity Assessment Upper Extremity Assessment: Overall WFL for tasks assessed   Lower Extremity Assessment Lower Extremity Assessment: Defer to PT evaluation   Cervical / Trunk Assessment Cervical / Trunk Assessment: Kyphotic   Communication  Communication Communication: No apparent difficulties   Cognition Arousal: Alert Behavior During Therapy: Restless Overall Cognitive Status: History of cognitive impairments - at baseline                                 General Comments: inconsistent following commands, verbose and tangential with non sensical speech     General Comments       Exercises     Shoulder Instructions      Home Living Family/patient expects to be discharged to:: Private residence Living Arrangements: Spouse/significant other Available Help at Discharge: Family;Available 24 hours/day Type of Home: House Home Access: Level entry     Home Layout: Two level;Able to live on main level with bedroom/bathroom     Bathroom Shower/Tub: Walk-in shower;Tub/shower unit   Bathroom Toilet: Handicapped height     Home Equipment: Shower seat - built in;Rollator (4 wheels);Wheelchair - manual;Cane - single point;Hospital bed   Additional Comments: spouse and family physically helping to move pt to [facility] to roll to bathroom as pt refuses to use [facility] and spouse refuses hoyer lift      Prior Functioning/Environment Prior Level of Function : Needs assist       Physical Assist : Mobility (physical);ADLs (physical) Mobility (physical): Bed mobility;Transfers ADLs (physical): Feeding;Grooming;Bathing;Dressing;Toileting Mobility Comments: max +2 to pivot to WC and toilet, increased struggle to get pt to move for the week PTA ADLs Comments: max assist for all        OT Problem List:        OT Treatment/Interventions:      OT Goals(Current goals can be found in the care plan section)    OT Frequency:      Co-evaluation PT/OT/SLP Co-Evaluation/Treatment: Yes Reason for Co-Treatment: For patient/therapist safety   OT goals addressed during session: ADL's and self-care      AM-PAC OT "6 Clicks" Daily Activity     Outcome Measure Help from another person eating meals?: Total Help from  another person taking care of personal grooming?: Total Help from another person toileting, which includes using toliet, bedpan, or urinal?: Total Help from another person bathing (including washing, rinsing, drying)?: Total Help from another person to put on and taking off regular upper body clothing?: Total Help from another person to put on and taking off regular lower body clothing?: Total 6 Click Score: 6   End of Session Equipment Utilized During Treatment: Oxygen (4L)  Activity Tolerance: Patient tolerated treatment well Patient left: in bed;with call bell/phone within reach;with bed alarm set  OT Visit Diagnosis: Muscle weakness (generalized) (M62.81)                Time: 7829-5621 OT Time Calculation (min): 32 min Charges:  OT General Charges $OT Visit: 1 Visit OT Evaluation $OT Eval Moderate Complexity: 1 Mod  Berna Spare, OTR/L Acute Rehabilitation Services Office: [phone]   Evern Bio 04/14/2023, 11:48 AM

## 2023-04-14 NOTE — Progress Notes (Addendum)
PROGRESS NOTE   Kathy [name]  XLK:[id number]    DOB: 15-Sep-1940    DOA: 04/13/2023  PCP: Corwin Levins, MD   I have briefly reviewed patients previous medical records in [facility].  Chief Complaint  Patient presents with   Loss of Consciousness    Brief Hospital Course:  82 year old married female, lives with her spouse, was on home hospice PTA, at baseline with what appears to be moderate dementia, ambulatory dysfunction and has not ambulated in quite a while, mostly bedbound in the last 3 weeks, PMH of PAF on Eliquis, stage IIIa CKD, ILD, chronic respiratory failure with hypoxia on 2-4 L/min [place] oxygen, type II DM, recurrent UTIs with resistant E. coli, presented to the ED on 10/16 with several weeks of not eating or drinking much, syncope without seizure like activity, abdominal pain, chronic cough and altered mental status.  Family rescinded hospice but remains DNR, wish to try medical management and short-term and reassess.  At time of discharge, they indicate that she cannot return home and will have to go to an SNF.  Admitted for dehydration, acute kidney injury, asymptomatic bacteriuria versus recurrent acute cystitis, syncope, acute metabolic encephalopathy.   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 Creatinine normal in July.  Now  presented with creatinine of 2.4. Suspect multifactorial secondary to dehydration, reported hypotension and possible ATN. IV fluids, strict intake output and monitor BMP Avoid nephrotoxics and hypotension. Did get a dose of IV Lasix in ED which probably has contributed as well. Bladder scan to rule out acute urinary retention.  Dehydration: Secondary to poor oral intake for several weeks. IV fluids.  P.o. intake as able. This is likely to be a recurrent problem.  Hypokalemia: Replace and follow. Magnesium normal.  Asymptomatic bacteriuria versus recurrent cystitis Unable to differentiate due to poor history.  Abdominal pain present. Continue empirically started IV meropenem pending urine culture results. Just completed Cipro 2 days PTA.  Acute metabolic encephalopathy Multifactorial due to AKI, UTI complicating underlying moderate to severe dementia. Delirium precautions  Suspected syncope May be related to hypotension versus orthostatic hypotension Continue telemetry, followed 2D echo but likely no aggressive intervention.  Hypotension Secondary to dehydration.?  Adrenal insufficiency.  BP on admission 101/48. Continue stress dose steroids given that patient is on chronic prednisone. Hold antihypertensives.  Elevated troponin Suspected due to demand ischemia in the absence of chest pain. Follow echo.  PAF on chronic anticoagulation Continue Eliquis, dose adjustment per pharmacy For now sotalol and Cardizem are held, resume gradually as able  Chronic HFrEF Although patient has all extremity edema, this appears to be third spacing.  She appears centrally volume depleted based on dry mucous membranes, hypotension, decreased urine output BNP 497, in the context of obesity TEE 2/24: LVEF 50-55% Got a dose of IV Lasix 20 mg x 1 in  ED but that was likely related to her extremity edema and definitely did not help her AKI Hold off on diuretics and monitor.  ILD/chronic  respiratory failure with hypoxia Continue supportive care On chronic prednisone 20 mg daily.  Currently on stress dose of IV steroids.  Type II DM Hold oral hypoglycemics.  SSI and adjust insulins as needed.  Dementia Suspect moderate if not severe. Delirium precautions  Depression Amitriptyline and sertraline  Adult failure to thrive/on home hospice PTA Overall poor prognosis and risk for recurrent decline. Had a long chat with patient's daughter-in-law and son via phone, they understand and actually felt that she should not have been brought to the hospital and should have remained home with hospice. However since she is here now, they wish to give a short trial of medical management as noted above and reassess.  Body mass index is 41.6 kg/m./Morbid obesity   Pressure Ulcer: Pressure Injury 04/13/23 Buttocks Lower;Left (Active)  04/13/23 1708  Location: Buttocks  Location Orientation: Lower;Left  Staging:   Wound Description (Comments):   Present on Admission: Yes  Dressing Type Foam - Lift dressing to assess site every shift 04/14/23 0400    DVT prophylaxis:   On Eliquis anticoagulation   Code Status: Do not attempt resuscitation (DNR) PRE-ARREST INTERVENTIONS DESIRED:  Family Communication:  Disposition:  Status is: Observation The patient will require care spanning > 2 midnights and should be moved to inpatient because: IV antibiotics, IV fluids     Consultants:     Procedures:     Antimicrobials:   Meropenem   Subjective:  Unable to get any history from patient.  She is awake, alert, oriented to self, keeps rambling and but not agitated.  Per daughter at bedside from [place], has been with patient for the last 3 days, she feels confusion is worse than a month ago, poor oral intake, vomited x 1 this morning.  Objective:   Vitals:   04/13/23 1707 04/13/23 2055 04/14/23 0452 04/14/23 0756  BP: 121/82 123/73 (!) 125/105 (!) 112/56  Pulse: (!) 56 68 70  74  Resp: 16 18 18 16   Temp: (!) 97.3 F (36.3 C) (!) 97.4 F (36.3 C) (!) 97.5 F (36.4 C) 98 F (36.7 C)  TempSrc: Oral Oral Oral   SpO2: 100% (!) 77% 97% 91%  Weight:      Height:        General exam: Elderly female, moderately built and obese lying comfortably propped up in bed without respiratory or painful distress. Respiratory system: Diminished and coarse breath sounds bilaterally, likely chronic related to her ILD.  No wheezing or rhonchi.  No increased work of breathing.  Has [place] oxygen. Cardiovascular system: S1 & S2 heard, RRR. No JVD, murmurs, rubs, gallops or clicks.  Has all extremity mild nonpitting edema. Gastrointestinal system: Abdomen is nondistended.  Epigastric midline vertical scar with mild tenderness but no peritoneal signs.  No organomegaly or masses appreciated.  Bowel sounds heard. Central nervous system: Alert and oriented x 1. No focal neurological deficits. Extremities: Symmetric 5 x 5 power. Skin: Extensive bruising of bilateral upper extremity and left leg. Psychiatry: Judgement and insight impaired. Mood & affect cannot be assessed at this time    Data Reviewed:   I have personally reviewed following labs and imaging studies   CBC: Recent Labs  Lab 04/13/23 1219 04/14/23 0602  WBC 14.1* 12.9*  NEUTROABS 10.5*  --   HGB 12.4 12.9  HCT 36.7 38.2  MCV 96.8 99.5  PLT 172 153    Basic Metabolic Panel: Recent Labs  Lab 04/13/23 1219 04/13/23 2150 04/13/23 2151 04/14/23 0602  NA  --  138  --  136  K  --  2.2*  --  3.3*  CL  --  74*  --  77*  CO2  --  >45*  --  38*  GLUCOSE  --  113*  --  159*  BUN  --  56*  --  54*  CREATININE  --  2.40* 2.48* 2.13*  CALCIUM  --  9.0  --  8.7*  MG 2.3  --   --   --     Liver Function Tests: Recent Labs  Lab 04/13/23 2150  AST 28  ALT 43  ALKPHOS 64  BILITOT 1.2  PROT 5.9*  ALBUMIN 3.0*    CBG: Recent Labs  Lab 04/13/23 1747 04/13/23 2030 04/14/23 0754  GLUCAP 72 105* 155*     Microbiology Studies:  No results found for this or any previous visit (from the past 240 hour(s)).  Radiology Studies:  DG Chest Portable 1 View  Result Date: 04/13/2023 CLINICAL DATA:  Syncope. EXAM: PORTABLE CHEST 1 VIEW COMPARISON:  Chest radiographs and CT 01/03/2023 FINDINGS: The cardiomediastinal silhouette is unchanged with normal heart size. Aortic atherosclerosis is noted. Lung volumes remain severely reduced with asymmetric elevation of the right hemidiaphragm. There are chronic, coarse bilateral interstitial densities. No definite acute airspace consolidation, overt pulmonary edema, sizable pleural effusion, or pneumothorax is identified. No acute osseous abnormality is seen. IMPRESSION: Chronic interstitial lung disease without definite acute finding. Electronically Signed   By: Sebastian Ache M.D.   On: 04/13/2023 13:53    Scheduled Meds:    amitriptyline  50 mg Oral QHS   apixaban  5 mg Oral BID   hydrocortisone sod succinate (SOLU-CORTEF) inj  100 mg Intravenous Q8H   insulin aspart  0-5 Units Subcutaneous QHS   insulin aspart  0-9 Units Subcutaneous TID WC   potassium chloride  40 mEq Oral Once   sertraline  25 mg Oral Daily   sodium chloride flush  3 mL Intravenous Q12H    Continuous Infusions:    dextrose 5 % and 0.45 % NaCl with KCl 20 mEq/L     meropenem (MERREM) IV       LOS: 0 days     Marcellus Scott, MD,  FACP, [facility], [facility], [facility]   [facility] [place]      To contact the attending provider between 7A-7P or the covering provider during after hours 7P-7A, please log into the web site [email or web address] and access using universal [place] password for that web site. If you do not have the password, please call the hospital operator.  04/14/2023, 8:12 AM

## 2023-04-15 DIAGNOSIS — Z515 Encounter for palliative care: Secondary | ICD-10-CM | POA: Diagnosis not present

## 2023-04-15 DIAGNOSIS — E876 Hypokalemia: Secondary | ICD-10-CM | POA: Diagnosis not present

## 2023-04-15 DIAGNOSIS — E860 Dehydration: Secondary | ICD-10-CM | POA: Diagnosis not present

## 2023-04-15 DIAGNOSIS — N390 Urinary tract infection, site not specified: Secondary | ICD-10-CM | POA: Diagnosis not present

## 2023-04-15 DIAGNOSIS — N179 Acute kidney failure, unspecified: Secondary | ICD-10-CM | POA: Diagnosis not present

## 2023-04-15 DIAGNOSIS — Z7189 Other specified counseling: Secondary | ICD-10-CM | POA: Diagnosis not present

## 2023-04-15 DIAGNOSIS — R55 Syncope and collapse: Secondary | ICD-10-CM | POA: Diagnosis not present

## 2023-04-15 LAB — CBC
HCT: 35.9 % — ABNORMAL LOW (ref 36.0–46.0)
Hemoglobin: 12.4 g/dL (ref 12.0–15.0)
MCH: 34.5 pg — ABNORMAL HIGH (ref 26.0–34.0)
MCHC: 34.5 g/dL (ref 30.0–36.0)
MCV: 100 fL (ref 80.0–100.0)
Platelets: 149 10*3/uL — ABNORMAL LOW (ref 150–400)
RBC: 3.59 MIL/uL — ABNORMAL LOW (ref 3.87–5.11)
RDW: 13.7 % (ref 11.5–15.5)
WBC: 14.8 10*3/uL — ABNORMAL HIGH (ref 4.0–10.5)
nRBC: 0 % (ref 0.0–0.2)

## 2023-04-15 LAB — GLUCOSE, CAPILLARY
Glucose-Capillary: 277 mg/dL — ABNORMAL HIGH (ref 70–99)
Glucose-Capillary: 317 mg/dL — ABNORMAL HIGH (ref 70–99)

## 2023-04-15 MED ORDER — DOCUSATE SODIUM 100 MG PO CAPS
100.0000 mg | ORAL_CAPSULE | Freq: Every morning | ORAL | Status: DC
  Administered 2023-04-15: 100 mg via ORAL
  Filled 2023-04-15 (×2): qty 1

## 2023-04-15 MED ORDER — GLYCOPYRROLATE 0.2 MG/ML IJ SOLN: 0.2000 mg | INTRAMUSCULAR | Status: DC | PRN

## 2023-04-15 MED ORDER — APIXABAN 2.5 MG PO TABS
2.5000 mg | ORAL_TABLET | Freq: Two times a day (BID) | ORAL | Status: DC
  Administered 2023-04-15: 2.5 mg via ORAL
  Filled 2023-04-15: qty 1

## 2023-04-15 MED ORDER — LORAZEPAM 2 MG/ML PO CONC
1.0000 mg | ORAL | Status: DC | PRN
  Administered 2023-04-16 – 2023-04-17 (×2): 1 mg via SUBLINGUAL
  Filled 2023-04-15 (×2): qty 1

## 2023-04-15 MED ORDER — POLYVINYL ALCOHOL 1.4 % OP SOLN: 1.0000 [drp] | Freq: Four times a day (QID) | OPHTHALMIC | Status: DC | PRN

## 2023-04-15 MED ORDER — PREDNISONE 20 MG PO TABS: 20.0000 mg | ORAL_TABLET | Freq: Every day | ORAL | Status: DC

## 2023-04-15 MED ORDER — HALOPERIDOL LACTATE 2 MG/ML PO CONC
2.0000 mg | ORAL | Status: DC | PRN
  Filled 2023-04-15 (×3): qty 5

## 2023-04-15 MED ORDER — GLYCOPYRROLATE 0.2 MG/ML IJ SOLN
0.2000 mg | INTRAMUSCULAR | Status: DC | PRN
  Filled 2023-04-15: qty 1

## 2023-04-15 MED ORDER — PREDNISONE 20 MG PO TABS
20.0000 mg | ORAL_TABLET | Freq: Every day | ORAL | Status: DC
  Administered 2023-04-15: 20 mg via ORAL
  Filled 2023-04-15: qty 1

## 2023-04-15 MED ORDER — LORAZEPAM 1 MG PO TABS: 1.0000 mg | ORAL_TABLET | ORAL | Status: DC | PRN

## 2023-04-15 MED ORDER — HYDROMORPHONE HCL 1 MG/ML IJ SOLN: 0.5000 mg | INTRAMUSCULAR | Status: DC | PRN

## 2023-04-15 MED ORDER — HALOPERIDOL 1 MG PO TABS: 2.0000 mg | ORAL_TABLET | ORAL | Status: DC | PRN

## 2023-04-15 MED ORDER — BIOTENE DRY MOUTH MT LIQD: 15.0000 mL | Freq: Three times a day (TID) | OROMUCOSAL | Status: DC

## 2023-04-15 MED ORDER — HALOPERIDOL LACTATE 5 MG/ML IJ SOLN
2.0000 mg | INTRAMUSCULAR | Status: DC | PRN
  Administered 2023-04-16 – 2023-04-18 (×2): 2 mg via INTRAVENOUS
  Filled 2023-04-15 (×3): qty 1

## 2023-04-15 MED ORDER — GLYCOPYRROLATE 1 MG PO TABS: 1.0000 mg | ORAL_TABLET | ORAL | Status: DC | PRN

## 2023-04-15 NOTE — Progress Notes (Signed)
Due to age >71 and scr >1.5, will adjust apixaban to 2.5mg  BID.  Ulyses Southward, PharmD, BCIDP, AAHIVP, CPP Infectious Disease Pharmacist 04/15/2023 8:14 AM

## 2023-04-15 NOTE — Plan of Care (Signed)
[facility] Tonsillectomy/Adenoidectomy, Postoperative PEDS care plan entered in error.

## 2023-04-15 NOTE — Progress Notes (Addendum)
PROGRESS NOTE   Kathy [name]  ZOX:[id number]    DOB: 1940-07-14    DOA: 04/13/2023  PCP: Corwin Levins, MD   I have briefly reviewed patients previous medical records in [facility].  Chief Complaint  Patient presents with   Loss of Consciousness    Brief Hospital Course:  82 year old married female, lives with her spouse, was on home hospice PTA, at baseline with what appears to be moderate dementia, ambulatory dysfunction and has not ambulated in quite a while, mostly bedbound in the last 3 weeks, PMH of PAF on Eliquis, stage IIIa CKD, ILD, chronic respiratory failure with hypoxia on 2-4 L/min [place] oxygen, type II DM, recurrent UTIs with resistant E. coli, presented to the ED on 10/16 with several weeks of not eating or drinking much, syncope without seizure like activity, abdominal pain, chronic cough and altered mental status.  Family rescinded hospice but remains DNR, wish to try medical management and short-term and reassess.  At time of discharge, they indicate that she cannot return home and will have to go to an SNF.  Admitted for dehydration, acute kidney injury, asymptomatic bacteriuria versus recurrent acute cystitis, syncope, acute metabolic encephalopathy.  Palliative care consulted, met with family, they were contemplating transitioning to full comfort care and residential hospice.  Awaiting final decision.  Addendum: Palliative care team update appreciated.  Patient has been transitioned to full comfort care and awaiting residential hospice.   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 Creatinine normal in July.  Now presented with creatinine of 2.4. Suspect multifactorial secondary to dehydration, reported hypotension and possible ATN. IV fluids, strict intake output and monitor BMP.  200 mL urine output documented but also had unmeasured urine output x 2. Avoid nephrotoxics and hypotension. Did get a dose of IV Lasix in ED which probably has contributed as well. Bladder scan to rule out acute urinary retention. Awaiting labs from this morning.  Dehydration: Secondary to poor oral intake for several weeks. IV fluids.  P.o. intake as able.  Per daughter at bedside, patient has been eating some, more than at home. This is likely to be a recurrent problem.  Hypokalemia: Replace and follow. Magnesium normal.  Awaiting a.m. labs.  Asymptomatic bacteriuria versus recurrent cystitis Unable to differentiate due to poor history.  Abdominal pain present. Continue empirically started IV meropenem pending urine culture results.  Urine culture results pending. Just completed Cipro 2 days PTA.  Acute metabolic encephalopathy Multifactorial due to AKI, UTI complicating underlying moderate to severe dementia. Delirium precautions Appears to be less restless this morning.  Suspected syncope May be related to hypotension versus orthostatic hypotension TTE: LVEF 60-65%, grade 1 diastolic dysfunction and no aortic stenosis.  Hypotension Secondary to dehydration.?  Adrenal insufficiency.  BP on admission 101/48. Continue stress dose steroids given that patient is on chronic prednisone.  PROGRESS NOTE   Kathy [name]  ZOX:[id number]    DOB: 1940-07-14    DOA: 04/13/2023  PCP: Corwin Levins, MD   I have briefly reviewed patients previous medical records in [facility].  Chief Complaint  Patient presents with   Loss of Consciousness    Brief Hospital Course:  82 year old married female, lives with her spouse, was on home hospice PTA, at baseline with what appears to be moderate dementia, ambulatory dysfunction and has not ambulated in quite a while, mostly bedbound in the last 3 weeks, PMH of PAF on Eliquis, stage IIIa CKD, ILD, chronic respiratory failure with hypoxia on 2-4 L/min [place] oxygen, type II DM, recurrent UTIs with resistant E. coli, presented to the ED on 10/16 with several weeks of not eating or drinking much, syncope without seizure like activity, abdominal pain, chronic cough and altered mental status.  Family rescinded hospice but remains DNR, wish to try medical management and short-term and reassess.  At time of discharge, they indicate that she cannot return home and will have to go to an SNF.  Admitted for dehydration, acute kidney injury, asymptomatic bacteriuria versus recurrent acute cystitis, syncope, acute metabolic encephalopathy.  Palliative care consulted, met with family, they were contemplating transitioning to full comfort care and residential hospice.  Awaiting final decision.  Addendum: Palliative care team update appreciated.  Patient has been transitioned to full comfort care and awaiting residential hospice.   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 Creatinine normal in July.  Now presented with creatinine of 2.4. Suspect multifactorial secondary to dehydration, reported hypotension and possible ATN. IV fluids, strict intake output and monitor BMP.  200 mL urine output documented but also had unmeasured urine output x 2. Avoid nephrotoxics and hypotension. Did get a dose of IV Lasix in ED which probably has contributed as well. Bladder scan to rule out acute urinary retention. Awaiting labs from this morning.  Dehydration: Secondary to poor oral intake for several weeks. IV fluids.  P.o. intake as able.  Per daughter at bedside, patient has been eating some, more than at home. This is likely to be a recurrent problem.  Hypokalemia: Replace and follow. Magnesium normal.  Awaiting a.m. labs.  Asymptomatic bacteriuria versus recurrent cystitis Unable to differentiate due to poor history.  Abdominal pain present. Continue empirically started IV meropenem pending urine culture results.  Urine culture results pending. Just completed Cipro 2 days PTA.  Acute metabolic encephalopathy Multifactorial due to AKI, UTI complicating underlying moderate to severe dementia. Delirium precautions Appears to be less restless this morning.  Suspected syncope May be related to hypotension versus orthostatic hypotension TTE: LVEF 60-65%, grade 1 diastolic dysfunction and no aortic stenosis.  Hypotension Secondary to dehydration.?  Adrenal insufficiency.  BP on admission 101/48. Continue stress dose steroids given that patient is on chronic prednisone.  PROGRESS NOTE   Kathy [name]  ZOX:[id number]    DOB: 1940-07-14    DOA: 04/13/2023  PCP: Corwin Levins, MD   I have briefly reviewed patients previous medical records in [facility].  Chief Complaint  Patient presents with   Loss of Consciousness    Brief Hospital Course:  82 year old married female, lives with her spouse, was on home hospice PTA, at baseline with what appears to be moderate dementia, ambulatory dysfunction and has not ambulated in quite a while, mostly bedbound in the last 3 weeks, PMH of PAF on Eliquis, stage IIIa CKD, ILD, chronic respiratory failure with hypoxia on 2-4 L/min [place] oxygen, type II DM, recurrent UTIs with resistant E. coli, presented to the ED on 10/16 with several weeks of not eating or drinking much, syncope without seizure like activity, abdominal pain, chronic cough and altered mental status.  Family rescinded hospice but remains DNR, wish to try medical management and short-term and reassess.  At time of discharge, they indicate that she cannot return home and will have to go to an SNF.  Admitted for dehydration, acute kidney injury, asymptomatic bacteriuria versus recurrent acute cystitis, syncope, acute metabolic encephalopathy.  Palliative care consulted, met with family, they were contemplating transitioning to full comfort care and residential hospice.  Awaiting final decision.  Addendum: Palliative care team update appreciated.  Patient has been transitioned to full comfort care and awaiting residential hospice.   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 Creatinine normal in July.  Now presented with creatinine of 2.4. Suspect multifactorial secondary to dehydration, reported hypotension and possible ATN. IV fluids, strict intake output and monitor BMP.  200 mL urine output documented but also had unmeasured urine output x 2. Avoid nephrotoxics and hypotension. Did get a dose of IV Lasix in ED which probably has contributed as well. Bladder scan to rule out acute urinary retention. Awaiting labs from this morning.  Dehydration: Secondary to poor oral intake for several weeks. IV fluids.  P.o. intake as able.  Per daughter at bedside, patient has been eating some, more than at home. This is likely to be a recurrent problem.  Hypokalemia: Replace and follow. Magnesium normal.  Awaiting a.m. labs.  Asymptomatic bacteriuria versus recurrent cystitis Unable to differentiate due to poor history.  Abdominal pain present. Continue empirically started IV meropenem pending urine culture results.  Urine culture results pending. Just completed Cipro 2 days PTA.  Acute metabolic encephalopathy Multifactorial due to AKI, UTI complicating underlying moderate to severe dementia. Delirium precautions Appears to be less restless this morning.  Suspected syncope May be related to hypotension versus orthostatic hypotension TTE: LVEF 60-65%, grade 1 diastolic dysfunction and no aortic stenosis.  Hypotension Secondary to dehydration.?  Adrenal insufficiency.  BP on admission 101/48. Continue stress dose steroids given that patient is on chronic prednisone.  No organomegaly or masses appreciated.  Bowel sounds heard. Central nervous system: Alert and oriented x 1. No focal neurological deficits.  Stable without change. Extremities: Symmetric 5 x 5 power. Skin: Extensive bruising of bilateral upper extremity and left leg.  Right forearm has dressing to protect IV line which she has pulled before. Psychiatry: Judgement and insight impaired. Mood & affect cannot be assessed at this time    Data  Reviewed:   I have personally reviewed following labs and imaging studies   CBC: Recent Labs  Lab 04/13/23 1219 04/14/23 0602  WBC 14.1* 12.9*  NEUTROABS 10.5*  --   HGB 12.4 12.9  HCT 36.7 38.2  MCV 96.8 99.5  PLT 172 153    Basic Metabolic Panel: Recent Labs  Lab 04/13/23 1219 04/13/23 2150 04/13/23 2151 04/14/23 0602  NA  --  138  --  136  K  --  2.2*  --  3.3*  CL  --  74*  --  77*  CO2  --  >45*  --  38*  GLUCOSE  --  113*  --  159*  BUN  --  56*  --  54*  CREATININE  --  2.40* 2.48* 2.13*  CALCIUM  --  9.0  --  8.7*  MG 2.3  --   --   --     Liver Function Tests: Recent Labs  Lab 04/13/23 2150  AST 28  ALT 43  ALKPHOS 64  BILITOT 1.2  PROT 5.9*  ALBUMIN 3.0*    CBG: Recent Labs  Lab 04/14/23 1626 04/14/23 2011 04/15/23 0802  GLUCAP 137* 180* 277*    Microbiology Studies:  No results found for this or any previous visit (from the past 240 hour(s)).  Radiology Studies:  ECHOCARDIOGRAM COMPLETE  Result Date: 04/14/2023    ECHOCARDIOGRAM REPORT   Patient Name:   Kathy [name] Date of Exam: 04/14/2023 Medical Rec #:  [id number]  Height:       60.0 in Accession #:    [id number] Weight:       213.0 lb Date of Birth:  1941-02-23  BSA:          1.917 m Patient Age:    82 years   BP:           112/56 mmHg Patient Gender: F          HR:           85 bpm. Exam Location:  Inpatient Procedure: 2D Echo, Cardiac Doppler and Color Doppler Indications:    R55 Syncope  History:        Patient has prior history of Echocardiogram examinations, most                 recent 08/18/2022. CHF, Abnormal ECG, Arrythmias:Atrial                 Fibrillation, Signs/Symptoms:Altered Mental Status, Syncope and                 Dyspnea; Risk Factors:Dyslipidemia, Diabetes and Hypertension.  Sonographer:    Sheralyn Boatman RDCS Referring Phys: [id number] RONDELL A SMITH  Sonographer Comments: Technically difficult study due to poor echo windows and patient is obese. Image acquisition challenging  due to patient body habitus. Patient has altered mental status. Patient talking throughout exam. Attempted to turn. Could not do Definity, no IV access. Patient complained of pain from probe in apical region, could not appy appropriate pressure. IMPRESSIONS  1. Left ventricular ejection  No organomegaly or masses appreciated.  Bowel sounds heard. Central nervous system: Alert and oriented x 1. No focal neurological deficits.  Stable without change. Extremities: Symmetric 5 x 5 power. Skin: Extensive bruising of bilateral upper extremity and left leg.  Right forearm has dressing to protect IV line which she has pulled before. Psychiatry: Judgement and insight impaired. Mood & affect cannot be assessed at this time    Data  Reviewed:   I have personally reviewed following labs and imaging studies   CBC: Recent Labs  Lab 04/13/23 1219 04/14/23 0602  WBC 14.1* 12.9*  NEUTROABS 10.5*  --   HGB 12.4 12.9  HCT 36.7 38.2  MCV 96.8 99.5  PLT 172 153    Basic Metabolic Panel: Recent Labs  Lab 04/13/23 1219 04/13/23 2150 04/13/23 2151 04/14/23 0602  NA  --  138  --  136  K  --  2.2*  --  3.3*  CL  --  74*  --  77*  CO2  --  >45*  --  38*  GLUCOSE  --  113*  --  159*  BUN  --  56*  --  54*  CREATININE  --  2.40* 2.48* 2.13*  CALCIUM  --  9.0  --  8.7*  MG 2.3  --   --   --     Liver Function Tests: Recent Labs  Lab 04/13/23 2150  AST 28  ALT 43  ALKPHOS 64  BILITOT 1.2  PROT 5.9*  ALBUMIN 3.0*    CBG: Recent Labs  Lab 04/14/23 1626 04/14/23 2011 04/15/23 0802  GLUCAP 137* 180* 277*    Microbiology Studies:  No results found for this or any previous visit (from the past 240 hour(s)).  Radiology Studies:  ECHOCARDIOGRAM COMPLETE  Result Date: 04/14/2023    ECHOCARDIOGRAM REPORT   Patient Name:   Kathy [name] Date of Exam: 04/14/2023 Medical Rec #:  [id number]  Height:       60.0 in Accession #:    [id number] Weight:       213.0 lb Date of Birth:  1941-02-23  BSA:          1.917 m Patient Age:    82 years   BP:           112/56 mmHg Patient Gender: F          HR:           85 bpm. Exam Location:  Inpatient Procedure: 2D Echo, Cardiac Doppler and Color Doppler Indications:    R55 Syncope  History:        Patient has prior history of Echocardiogram examinations, most                 recent 08/18/2022. CHF, Abnormal ECG, Arrythmias:Atrial                 Fibrillation, Signs/Symptoms:Altered Mental Status, Syncope and                 Dyspnea; Risk Factors:Dyslipidemia, Diabetes and Hypertension.  Sonographer:    Sheralyn Boatman RDCS Referring Phys: [id number] RONDELL A SMITH  Sonographer Comments: Technically difficult study due to poor echo windows and patient is obese. Image acquisition challenging  due to patient body habitus. Patient has altered mental status. Patient talking throughout exam. Attempted to turn. Could not do Definity, no IV access. Patient complained of pain from probe in apical region, could not appy appropriate pressure. IMPRESSIONS  1. Left ventricular ejection

## 2023-04-15 NOTE — Plan of Care (Signed)

## 2023-04-15 NOTE — TOC Initial Note (Signed)
Transition of Care [facility]) - Initial/Assessment Note    Patient Details  Name: Kathy [name] MRN: [id number] Date of Birth: [birth year]  Transition of [facility]) CM/SW Contact:    Janae Bridgeman, RN Phone Number: 04/15/2023, 1:31 PM  Clinical Narrative:                 CM met with the patient and family at the bedside to discuss need for Inpatient Hospice placement.  The patient awaked to open eyes and has family at the bedside.  The patient's spouse was at the bedside and asked that I call and speak with the daughter, Anaiz Dreiling - [phone] by phone to provide Medicare choice regarding Inpatient Hospice facility placement.  The patient's daughter prefers [facility] and inpatient hospice placement at [facility] location preferably over [facility] since the spouse lives at the home in [place].  I called and spoke with Glenna Fellows, RNCM with [facility] and I placed the referral for Inpatient Hospice placement at preferred [place] location.  Shawn, RNCM with [facility] was provided with contact for daughter to discuss.  No beds are available at [facility] in [place] nor [facility] at this time but referral was accepted.  Expected Discharge Plan: Hospice Medical Facility Barriers to Discharge: Continued Medical Work up   Patient Goals and CMS Choice Patient states their goals for this hospitalization and ongoing recovery are:: Patient unable to state goals CMS Medicare.gov Compare Post Acute Care list provided to:: Patient Represenative (must comment) Carlus Pavlov, daughter) Choice offered to / list presented to : Adult Children [facility] ownership interest in [facility].provided to:: Adult Children    Expected Discharge Plan and Services   Discharge Planning Services: CM Consult Post Acute Care Choice: Residential Hospice Bed Living arrangements for the past 2 months: Single Family Home                                      Prior Living  Arrangements/Services Living arrangements for the past 2 months: Single Family Home Lives with:: Spouse Patient language and need for interpreter reviewed:: Yes Do you feel safe going back to the place where you live?: No   Patient needs Inpatient Hospice facility placement  Need for Family Participation in Patient Care: Yes (Comment) Care giver support system in place?: Yes (comment) Current home services: DME, Hospice (DME at the home includes hospital bed, shower seat, Rolator, WC, Cane, home oxygen, Active with [facility]) Criminal Activity/Legal Involvement Pertinent to Current Situation/Hospitalization: No - Comment as needed  Activities of Daily Living   ADL Screening (condition at time of admission) Independently performs ADLs?: No Does the patient have a NEW difficulty with bathing/dressing/toileting/self-feeding that is expected to last >3 days?: Yes (Initiates electronic notice to provider for possible OT consult) Does the patient have a NEW difficulty with getting in/out of bed, walking, or climbing stairs that is expected to last >3 days?: Yes (Initiates electronic notice to provider for possible PT consult) Does the patient have a NEW difficulty with communication that is expected to last >3 days?: Yes (Initiates electronic notice to provider for possible SLP consult) Is the patient deaf or have difficulty hearing?: No Does the patient have difficulty seeing, even when wearing glasses/contacts?: No Does the patient have difficulty concentrating, remembering, or making decisions?: Yes  Permission Sought/Granted Permission sought to share information with : Case Manager, Magazine features editor, Family  Supports Permission granted to share information with : Yes, Verbal Permission Granted        Permission granted to share info w Relationship: Rick Moshe, daughter - [phone]     Emotional Assessment Appearance:: Appears stated  age Attitude/Demeanor/Rapport: Unable to Assess Affect (typically observed): Unable to Assess Orientation: : Oriented to Self Alcohol / Substance Use: Not Applicable Psych Involvement: No (comment)  Admission diagnosis:  Syncope and collapse [R55] Syncope, unspecified syncope type [R55] UTI due to extended-spectrum beta lactamase (ESBL) producing Escherichia coli [N39.0, B96.29, Z16.12] Patient Active Problem List   Diagnosis Date Noted   Syncope and collapse 04/13/2023   Leukocytosis 04/13/2023   Hypotension 04/13/2023   Elevated troponin 04/13/2023   Dementia (HCC) 04/13/2023   Type 2 diabetes mellitus, without long-term current use of insulin (HCC) 04/13/2023   Pressure ulcer, sacrum 04/13/2023   Bilateral hearing loss due to cerumen impaction 12/30/2022   Memory loss 10/04/2022   Atrial fibrillation with RVR (HCC) 08/14/2022   CAP (community acquired pneumonia) 08/14/2022   Confusion 08/03/2022   Depression, psychotic (HCC) 08/03/2022   Rash 06/29/2022   Foul smelling urine 05/31/2022   Wheezing 05/31/2022   UTI due to extended-spectrum beta lactamase (ESBL) producing Escherichia coli 12/11/2021   AMS (altered mental status) 12/02/2021   AKI (acute kidney injury) (HCC) 12/02/2021   Acquired absence of other specified parts of digestive tract 12/01/2021   Metabolic encephalopathy 12/01/2021   Other polyuria 12/01/2021   Urinary frequency 09/22/2021   Peripheral neuropathy 03/23/2021   Pulmonary hypertension, unspecified (HCC) 12/04/2020   Atrial fibrillation (HCC) 07/11/2020   HFrEF (heart failure with reduced ejection fraction) (HCC) 07/11/2020   Positive ANA (antinuclear antibody) 05/01/2020   Osteoarthritis 05/01/2020   Bilateral primary osteoarthritis of knee 05/01/2020   Aortic atherosclerosis (HCC) 04/18/2020   COVID-19 virus infection 07/31/2019   Personal history of COVID-19 06/29/2019   Bilateral knee pain 06/10/2019   Leg swelling 02/13/2019    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 (HCC) 02/13/2019   Physical deconditioning 02/13/2019   Vitamin D deficiency 12/04/2018   B12 deficiency 12/04/2018   Hyperglycemia 11/16/2018   Abdominal pain 06/02/2018   Acute on chronic kidney failure (HCC) 12/12/2017   Diastolic dysfunction 12/12/2017   Endometrial ca (HCC) 11/30/2017   GERD (gastroesophageal reflux disease) 11/30/2017   HLD (hyperlipidemia) 11/30/2017   CKD (chronic kidney disease) stage 3, GFR 30-59 ml/min (HCC) 11/30/2017   Dyspnea on exertion 11/07/2017   Cough 11/07/2017   ILD (interstitial lung disease) (HCC) 03/15/2017   Chronic respiratory failure with hypoxia (HCC) 03/15/2017   Oral herpes simplex infection    Influenza with pneumonia 06/28/2016   Community acquired pneumonia 06/27/2016   Essential hypertension 06/27/2016   Depression 06/27/2016   Sleep apnea 06/27/2016   Body mass index (BMI) 39.0-39.9, adult 06/29/1999   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) 06/29/1999   Hypertensive heart and chronic kidney disease with heart failure and stage 1 through stage 4 chronic kidney disease, or unspecified chronic kidney disease (HCC) 06/29/1999   Long term (current) use of anticoagulants 06/29/1999   Morbid (severe) obesity due to excess calories (HCC) 06/29/1999   Oxygen dependent 06/29/1999   Polyneuropathy in diseases classified elsewhere (HCC) 06/29/1999   Prediabetes 06/29/1999   Pure hypercholesterolemia, unspecified 06/29/1999   Umbilical hernia without obstruction or gangrene 06/29/1999   Personal history of nicotine dependence 06/28/1996   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 [place] [address] Phone: [phone] Fax: [phone]     Social Determinants of Health (SDOH) Social History: SDOH Screenings   Food  Insecurity: Patient Unable To Answer (04/13/2023)  Housing: High Risk (04/13/2023)  Transportation Needs: Patient Unable To Answer (04/13/2023)  Utilities: Patient Unable To Answer (04/13/2023)  Alcohol Screen: Low Risk  (02/15/2023)  Depression (PHQ2-9): Medium Risk (02/15/2023)  Financial Resource Strain: Low Risk  (02/15/2023)  Physical Activity: Inactive (02/15/2023)  Social Connections: Moderately Isolated (02/15/2023)  Stress: Stress Concern Present (02/15/2023)  Tobacco Use: Medium Risk (04/13/2023)  Health Literacy: Adequate Health Literacy (02/15/2023)   SDOH Interventions:     Readmission Risk Interventions    04/15/2023    1:31 PM  Readmission Risk Prevention Plan  Transportation Screening Complete  PCP or Specialist Appt within 3-5 Days Complete  HRI or Home Care Consult Complete  Social Work Consult for Recovery Care Planning/Counseling Complete  Palliative Care Screening Complete  Medication Review Oceanographer) Complete

## 2023-04-15 NOTE — Progress Notes (Signed)
Daily Progress Note   Patient Name: Kathy [name]       Date: 04/15/2023 DOB: 1941-03-16  Age: 82 y.o. MRN#: [id number] Attending Physician: Elease Etienne, MD Primary Care Physician: Corwin Levins, MD Admit Date: 04/13/2023  Reason for Consultation/Follow-up: Establishing goals of care  Subjective: Medical records reviewed including progress notes, labs, imaging. Patient assessed at the bedside.  She is resting comfortably in no acute distress.  Did not attempt to arouse in order to preserve comfort.  No family present during my visit.  Called patient's son [name] for ongoing goals of care discussions and palliative support.  Created space and opportunity for family's thoughts and feelings on patient's current illness.  [name] shares that they were able to discuss together yesterday evening and made the decision to pursue residential hospice placement.  They have experience with [facility] in the past and would also be agreeable to the hospice facility in [place].  Reviewed what comfort focused care would look like in the hospital while coordinating transfer to residential hospice, counseling that patient would no longer receive aggressive medical interventions such as continuous vital signs, lab work, radiology testing, or medications not focused on comfort. All care would focus on how the patient is looking and feeling. This would include management of any symptoms that may cause discomfort, pain, shortness of breath, cough, nausea, agitation, anxiety, and/or secretions etc. Symptoms would be managed with medications and other non-pharmacological interventions such as spiritual support if requested, repositioning, music therapy, or therapeutic listening. Family verbalized understanding and appreciation.   She has had some difficulty with swallowing lately and they would not want to make this harder on her with unnecessary pill burden.  They understand that medications will be started on an as-needed basis with further titration based on her needs.   Questions and concerns addressed. PMT will continue to support holistically.   Length of Stay: 1   Physical Exam Vitals and nursing note reviewed.  Constitutional:      General: She is sleeping. She is not in acute distress.    Interventions: Nasal cannula in place.     Comments: 4L  Cardiovascular:     Rate and Rhythm: Normal rate.  Pulmonary:     Effort: Pulmonary effort is normal. No respiratory distress.  Psychiatric:        Cognition and Memory: Cognition is impaired.            Vital Signs: BP 125/76 (BP Location: Left Arm)   Pulse 83   Temp (!) 97.5 F (36.4 C) (Oral)   Resp 16   Ht 5' (1.524 m)   Wt 96.6 kg Comment: from 02/15/23  SpO2 100%   BMI 41.60 kg/m  SpO2: SpO2: 100 % O2 Device: O2 Device: Nasal Cannula O2 Flow Rate: O2 Flow Rate (L/min): 3 L/min      Palliative Assessment/Data: 20%   Palliative Care Assessment & Plan   Patient Profile: 82 y.o. female  with past medical history of moderate dementia, ambulatory dysfunction, PAF on Eliquis, stage IIIa CKD, ILD, chronic respiratory failure with hypoxia on 2-4 L/min [place] oxygen, type II DM, and recurrent UTIs with resistant E. coli  admitted on 04/13/2023 with  several weeks of not eating or drinking much, syncope without seizure like activity, abdominal pain, chronic cough and altered mental status. Found to have AKI - creatinine 2.4, was normal in July. Suspected d/t dehydration, hypotension, and ?ATN. Patient was receiving hospice services at home prior to admission. PMT consulted to discuss GOC.   Assessment: Goals of care conversation End-of-life care AKI on CKD 3A Recurrent cystitis Acute metabolic encephalopathy Failure to  thrive HFrEF  Recommendations/Plan: Continue DNR/DNI Transition to full comfort focused care today.  Discontinue medications not aimed at providing comfort Dilaudid PRN for pain/air hunger/comfort Robinul PRN for excessive secretions Ativan PRN for agitation/anxiety Zofran PRN for nausea Liquifilm tears PRN for dry eyes Haldol PRN for agitation/anxiety May have comfort feeding Unrestricted visitations in the setting of EOL (per policy) Oxygen PRN for comfort. No escalation.   TOC consulted for referral to residential hospice, either [facility] or hospice home in [place].  Assistance is appreciated Psychosocial and emotional support provided PMT will continue to follow and support   Prognosis:  < 2 weeks  Discharge Planning: Hospice facility  Care plan was discussed with patient's family, TOC   MDM high         Shine Scrogham Jeni Salles, PA-C  Palliative Medicine Team Team phone # [phone]  Thank you for allowing the Palliative Medicine Team to assist in the care of this patient. Please utilize secure chat with additional questions, if there is no response within 30 minutes please call the above phone number.  Palliative Medicine Team providers are available by phone from 7am to 7pm daily and can be reached through the team cell phone.  Should this patient require assistance outside of these hours, please call the patient's attending physician.

## 2023-04-15 NOTE — Progress Notes (Signed)
[facility] Liaison Note  Received request from [place], Transitions of Care Manager, for hospice services at inpatient hospice facility after discharge. Spoke with family at bedside to initiate education related to hospice philosophy, services, and team approach to care, including care and expectations for inpatient hospice facility. Family verbalized understanding of information given.   Patient was alert this morning and able to eat a good meal. However, she is currently lethargic and receiving only comfort care. Discussed ongoing evaluation in the morning to assess inpatient hospice unit appropriateness. Family is in agreement with this plan.   [facility] information and contact numbers given to family. Above information shared with Marcelino Duster, Transitions of Care Manager. Please call with any questions or concerns.   Thank you for the opportunity to participate in this patient's care.   Glenna Fellows BSN, Charity fundraiser, OCN [employer] [phone]

## 2023-04-16 DIAGNOSIS — Z515 Encounter for palliative care: Secondary | ICD-10-CM | POA: Diagnosis not present

## 2023-04-16 DIAGNOSIS — R638 Other symptoms and signs concerning food and fluid intake: Secondary | ICD-10-CM

## 2023-04-16 DIAGNOSIS — R55 Syncope and collapse: Secondary | ICD-10-CM | POA: Diagnosis not present

## 2023-04-16 DIAGNOSIS — Z789 Other specified health status: Secondary | ICD-10-CM

## 2023-04-16 DIAGNOSIS — N390 Urinary tract infection, site not specified: Secondary | ICD-10-CM | POA: Diagnosis not present

## 2023-04-16 DIAGNOSIS — Z66 Do not resuscitate: Secondary | ICD-10-CM | POA: Diagnosis not present

## 2023-04-16 NOTE — Progress Notes (Signed)
Pt beating her hands and arms against the bed, trying to rip off her gown, sheets, IV, and nasal cannula. Daughter at bedside attempting to reorient pt as well as Clinical research associate but unsuccessful. PRN Ativan and Haldol given.

## 2023-04-16 NOTE — Progress Notes (Signed)
[facility] Liaison Note   Patient is approved for inpatient hospice facility transfer by hospice provider. Currently there is not a bed available to offer. Will continue to assess availability.    Please call with any questions or concerns.    Thank you for the opportunity to participate in this patient's care.    Glenna Fellows BSN, Charity fundraiser, OCN [employer] [phone]

## 2023-04-16 NOTE — Progress Notes (Signed)
Daily Progress Note   Patient Name: Kathy [name]       Date: 04/16/2023 DOB: March 09, 1941  Age: 82 y.o. MRN#: [id number] Attending Physician: Elease Etienne, MD Primary Care Physician: Corwin Levins, MD Admit Date: 04/13/2023  Reason for Consultation/Follow-up: Non pain symptom management, Pain control, Psychosocial/spiritual support, and Terminal Care  Subjective: I have reviewed medical records including EPIC notes, MAR, and labs. Unable to receive report from primary RN.  Went to visit patient at bedside - no family/visitors present. Patient was lying in bed asleep - I did not attempt to wake her to preserve comfort. No signs or non-verbal gestures of pain or discomfort noted. No respiratory distress, increased work of breathing, or secretions noted. She is on 4L O2 [place].  [facility] to evaluate patient today for residential hospice appropriateness.   Length of Stay: 2  Current Medications: Scheduled Meds:   amitriptyline  50 mg Oral QHS   antiseptic oral rinse  15 mL Topical TID   docusate sodium  100 mg Oral q AM   sertraline  25 mg Oral Daily   sodium chloride flush  3 mL Intravenous Q12H    Continuous Infusions:   PRN Meds: acetaminophen **OR** acetaminophen, albuterol, glycopyrrolate **OR** glycopyrrolate **OR** glycopyrrolate, haloperidol **OR** haloperidol **OR** haloperidol lactate, HYDROmorphone (DILAUDID) injection, LORazepam **OR** LORazepam, polyvinyl alcohol  Physical Exam Vitals and nursing note reviewed.  Constitutional:      General: She is not in acute distress.    Appearance: She is ill-appearing.  Pulmonary:     Effort: No respiratory distress.  Skin:    General: Skin is warm and dry.  Neurological:     Comments: asleep             Vital Signs: BP  133/84 (BP Location: Left Arm)   Pulse (!) 145   Temp (!) 97.4 F (36.3 C) (Oral)   Resp 20   Ht 5' (1.524 m)   Wt 96.6 kg Comment: from 02/15/23  SpO2 95%   BMI 41.60 kg/m  SpO2: SpO2: 95 % O2 Device: O2 Device: Room Air O2 Flow Rate: O2 Flow Rate (L/min): 4 L/min (left on 4L per family request (RN made MD aware))  Intake/output summary:  Intake/Output Summary (Last 24 hours) at 04/16/2023 1016 Last data filed at 04/15/2023 1800 Gross per 24 hour  Intake --  Output 500 ml  Net -500 ml   LBM: Last BM Date :  (UTA) Baseline Weight: Weight: 96.6 kg (from 02/15/23) Most recent weight: Weight: 96.6 kg (from 02/15/23)       Palliative Assessment/Data: PPS 20%      Patient Active Problem List   Diagnosis Date Noted   Syncope and collapse 04/13/2023   Leukocytosis 04/13/2023   Hypotension 04/13/2023   Elevated troponin 04/13/2023   Dementia (HCC) 04/13/2023   Type 2 diabetes mellitus, without long-term current use of insulin (HCC) 04/13/2023   Pressure ulcer, sacrum 04/13/2023   Bilateral hearing loss due to cerumen impaction 12/30/2022   Memory loss 10/04/2022   Atrial fibrillation with RVR (HCC) 08/14/2022   CAP (community acquired pneumonia) 08/14/2022   Confusion 08/03/2022   Depression, psychotic (HCC) 08/03/2022   Rash 06/29/2022   Foul smelling urine 05/31/2022   Wheezing 05/31/2022   UTI due to extended-spectrum beta lactamase (ESBL) producing Escherichia coli 12/11/2021   AMS (altered mental status) 12/02/2021   AKI (acute kidney injury) (HCC) 12/02/2021   Acquired absence of other specified parts of digestive tract 12/01/2021   Metabolic encephalopathy 12/01/2021   Other polyuria 12/01/2021   Urinary frequency 09/22/2021   Peripheral neuropathy 03/23/2021   Pulmonary hypertension, unspecified (HCC) 12/04/2020   Atrial fibrillation (HCC) 07/11/2020   HFrEF (heart failure with reduced ejection fraction) (HCC) 07/11/2020   Positive ANA (antinuclear antibody)  05/01/2020   Osteoarthritis 05/01/2020   Bilateral primary osteoarthritis of knee 05/01/2020   Aortic atherosclerosis (HCC) 04/18/2020   COVID-19 virus infection 07/31/2019   Personal history of COVID-19 06/29/2019   Bilateral knee pain 06/10/2019   Leg swelling 02/13/2019   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 (HCC) 02/13/2019   Physical deconditioning 02/13/2019   Vitamin D deficiency 12/04/2018   B12 deficiency 12/04/2018   Hyperglycemia 11/16/2018   Abdominal pain 06/02/2018   Acute on chronic kidney failure (HCC) 12/12/2017   Diastolic dysfunction 12/12/2017   Endometrial ca (HCC) 11/30/2017   GERD (gastroesophageal reflux disease) 11/30/2017   HLD (hyperlipidemia) 11/30/2017   CKD (chronic kidney disease) stage 3, GFR 30-59 ml/min (HCC) 11/30/2017   Dyspnea on exertion 11/07/2017   Cough 11/07/2017   ILD (interstitial lung disease) (HCC) 03/15/2017   Chronic respiratory failure with hypoxia (HCC) 03/15/2017   Oral herpes simplex infection    Influenza with pneumonia 06/28/2016   Community acquired pneumonia 06/27/2016   Essential hypertension 06/27/2016   Depression 06/27/2016   Sleep apnea 06/27/2016   Body mass index (BMI) 39.0-39.9, adult 06/29/1999   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) 06/29/1999   Hypertensive heart and chronic kidney disease with heart failure and stage 1 through stage 4 chronic kidney disease, or unspecified chronic kidney disease (HCC) 06/29/1999   Long term (current) use of anticoagulants 06/29/1999   Morbid (severe) obesity due to excess calories (HCC) 06/29/1999   Oxygen dependent 06/29/1999   Polyneuropathy in diseases classified elsewhere (HCC) 06/29/1999   Prediabetes 06/29/1999   Pure hypercholesterolemia, unspecified 06/29/1999   Umbilical hernia without obstruction or gangrene 06/29/1999   Personal history of nicotine dependence 06/28/1996   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 female with past medical history of moderate dementia, ambulatory dysfunction, PAF on Eliquis, stage IIIa CKD, ILD, chronic respiratory failure with hypoxia on 2-4 L/min [place] oxygen, type II DM, and recurrent UTIs with resistant E. coli  admitted on 04/13/2023 with several weeks of not eating or drinking much, syncope without seizure like activity, abdominal pain, chronic cough and altered mental status. Found to have AKI - creatinine 2.4, was normal in July. Suspected d/t dehydration, hypotension, and ?ATN. Patient was receiving hospice services at home prior to admission. PMT consulted to discuss GOC.   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 Copy was made and will be scanned into Vyn[employer]/ACP tab [facility] to evaluate patient today for residential hospice appropriateness Continue current comfort focused medication regimen - no changes PMT will continue to follow and support holistically  Symptom Management Dilaudid PRN pain/dyspnea/increased work of breathing/RR>25 Tylenol PRN pain/fever Biotin twice daily Robinul PRN secretions Haldol PRN agitation/delirium Ativan PRN  anxiety/seizure/sleep/distress Liquifilm Tears PRN dry eye Albuterol PRN wheezing/shortness of breath Continue amitriptyline and zoloft until no longer tolerating POs   Goals of Care and Additional Recommendations: Limitations on Scope of Treatment: Full Comfort Care  Code Status:    Code Status Orders  (From admission, onward)           Start     Ordered   04/15/23 1211  Do not attempt resuscitation (DNR) - Comfort care  Continuous       Question Answer Comment  If patient has no pulse and is not breathing Do Not Attempt Resuscitation   In Pre-Arrest Conditions (Patient Is Breathing and Has a Pulse) Provide comfort measures. Relieve any mechanical airway obstruction. Avoid transfer unless required for comfort.   Consent: Discussion documented in EHR or advanced directives reviewed      04/15/23 1211           Code Status History     Date Active Date Inactive Code Status Order ID Comments User Context   04/13/2023 1449 04/15/2023 1211 Do not attempt resuscitation (DNR) PRE-ARREST INTERVENTIONS DESIRED [id number]  Clydie Braun, MD ED   08/21/2022 1144 08/21/2022 1939 DNR [id number]  Zigmund Daniel., MD Inpatient   08/20/2022 1135 08/21/2022 1144 Full Code [id number]  Custovic, Rozell Searing, DO Inpatient   08/14/2022 0253 08/20/2022 1135 Full Code [id number]  Hillary Bow, DO ED   12/02/2021 0055 12/04/2021 1453 Full Code [id number]  Darlin Drop, DO ED   06/27/2016 2130 07/04/2016 1933 Full Code [id number]  Darreld Mclean, MD ED      Advance Directive Documentation    Flowsheet Row Most Recent Value  Type of Advance Directive Healthcare Power of Attorney, Living will  Pre-existing out of facility DNR order (yellow form or pink MOST form) --  "MOST" Form in Place? --       Prognosis:  < 2 weeks  Discharge Planning: Hospice facility   Thank you for allowing the Palliative Medicine Team to assist in the care of this patient.  Haskel Khan, NP  Please contact  Palliative Medicine Team phone at [phone] for questions and concerns.   *Portions of this note are a verbal dictation therefore any spelling and/or grammatical errors are due to the "[employer]" system interpretation.

## 2023-04-16 NOTE — Progress Notes (Signed)
PROGRESS NOTE   Kathy [name]  VZD:[id number]    DOB: 05/25/1941    DOA: 04/13/2023  PCP: Corwin Levins, MD   I have briefly reviewed patients previous medical records in [facility].  Chief Complaint  Patient presents with   Loss of Consciousness    Brief Hospital Course:  82 year old married female, lives with her spouse, was on home hospice PTA, at baseline with what appears to be moderate dementia, ambulatory dysfunction and has not ambulated in quite a while, mostly bedbound in the last 3 weeks, PMH of PAF on Eliquis, stage IIIa CKD, ILD, chronic respiratory failure with hypoxia on 2-4 L/min Dawes oxygen, type II DM, recurrent UTIs with resistant E. coli, presented to the ED on 10/16 with several weeks of not eating or drinking much, syncope without seizure like activity, abdominal pain, chronic cough and altered mental status.  Family rescinded hospice but remains DNR, wish to try medical management and short-term and reassess.  At time of discharge, they indicate that she cannot return home and will have to go to an SNF.  Admitted for dehydration, acute kidney injury, asymptomatic bacteriuria versus recurrent acute cystitis, syncope, acute metabolic encephalopathy.  Palliative care consulted, coordinated care with family, patient was transitioned to full comfort care on 10/18 and is currently awaiting residential hospice.   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 Creatinine normal in July.  Now presented with creatinine of 2.4. Suspect multifactorial secondary to dehydration, reported hypotension and possible ATN. Avoid nephrotoxics and hypotension. Did get a dose of IV Lasix in ED which probably has contributed as well. Treated supportively.  Palliative care team met with family on 10/18, patient was transition to full comfort care and is currently waiting for residential hospice bed.  Dehydration: Secondary to poor oral intake for several weeks. Resolved after brief IV fluids.  Patient reportedly eating better as per daughter at bedside. This is likely to be a recurrent problem.  Hypokalemia: Replaced.  Magnesium normal.  Asymptomatic bacteriuria versus recurrent cystitis Unable to differentiate due to poor history.  Abdominal pain present. Just completed Cipro 2 days PTA. Treated briefly with meropenem which was discontinued after transitioning to full comfort care.  Acute metabolic encephalopathy Multifactorial due to AKI, UTI complicating underlying moderate to severe dementia. Delirium precautions Per daughter, was agitated and thrashing all night.  Unclear why she did not get any as needed IV Haldol ordered per comfort care order set.  Will update nursing.  May need to consider scheduled medication late evening such as Seroquel as long as she is taking p.o.  Suspected syncope May be related to hypotension versus orthostatic hypotension TTE: LVEF 60-65%, grade 1 diastolic dysfunction and no aortic stenosis.  Hypotension Secondary to dehydration.?  Adrenal insufficiency.  BP on admission 101/48. Continue stress dose steroids given that patient is on  PROGRESS NOTE   Kathy [name]  VZD:[id number]    DOB: 05/25/1941    DOA: 04/13/2023  PCP: Corwin Levins, MD   I have briefly reviewed patients previous medical records in [facility].  Chief Complaint  Patient presents with   Loss of Consciousness    Brief Hospital Course:  82 year old married female, lives with her spouse, was on home hospice PTA, at baseline with what appears to be moderate dementia, ambulatory dysfunction and has not ambulated in quite a while, mostly bedbound in the last 3 weeks, PMH of PAF on Eliquis, stage IIIa CKD, ILD, chronic respiratory failure with hypoxia on 2-4 L/min [place] oxygen, type II DM, recurrent UTIs with resistant E. coli, presented to the ED on 10/16 with several weeks of not eating or drinking much, syncope without seizure like activity, abdominal pain, chronic cough and altered mental status.  Family rescinded hospice but remains DNR, wish to try medical management and short-term and reassess.  At time of discharge, they indicate that she cannot return home and will have to go to an SNF.  Admitted for dehydration, acute kidney injury, asymptomatic bacteriuria versus recurrent acute cystitis, syncope, acute metabolic encephalopathy.  Palliative care consulted, coordinated care with family, patient was transitioned to full comfort care on 10/18 and is currently awaiting residential hospice.   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 Creatinine normal in July.  Now presented with creatinine of 2.4. Suspect multifactorial secondary to dehydration, reported hypotension and possible ATN. Avoid nephrotoxics and hypotension. Did get a dose of IV Lasix in ED which probably has contributed as well. Treated supportively.  Palliative care team met with family on 10/18, patient was transition to full comfort care and is currently waiting for residential hospice bed.  Dehydration: Secondary to poor oral intake for several weeks. Resolved after brief IV fluids.  Patient reportedly eating better as per daughter at bedside. This is likely to be a recurrent problem.  Hypokalemia: Replaced.  Magnesium normal.  Asymptomatic bacteriuria versus recurrent cystitis Unable to differentiate due to poor history.  Abdominal pain present. Just completed Cipro 2 days PTA. Treated briefly with meropenem which was discontinued after transitioning to full comfort care.  Acute metabolic encephalopathy Multifactorial due to AKI, UTI complicating underlying moderate to severe dementia. Delirium precautions Per daughter, was agitated and thrashing all night.  Unclear why she did not get any as needed IV Haldol ordered per comfort care order set.  Will update nursing.  May need to consider scheduled medication late evening such as Seroquel as long as she is taking p.o.  Suspected syncope May be related to hypotension versus orthostatic hypotension TTE: LVEF 60-65%, grade 1 diastolic dysfunction and no aortic stenosis.  Hypotension Secondary to dehydration.?  Adrenal insufficiency.  BP on admission 101/48. Continue stress dose steroids given that patient is on  PROGRESS NOTE   Kathy [name]  VZD:[id number]    DOB: 05/25/1941    DOA: 04/13/2023  PCP: Corwin Levins, MD   I have briefly reviewed patients previous medical records in [facility].  Chief Complaint  Patient presents with   Loss of Consciousness    Brief Hospital Course:  82 year old married female, lives with her spouse, was on home hospice PTA, at baseline with what appears to be moderate dementia, ambulatory dysfunction and has not ambulated in quite a while, mostly bedbound in the last 3 weeks, PMH of PAF on Eliquis, stage IIIa CKD, ILD, chronic respiratory failure with hypoxia on 2-4 L/min [employer] oxygen, type II DM, recurrent UTIs with resistant E. coli, presented to the ED on 10/16 with several weeks of not eating or drinking much, syncope without seizure like activity, abdominal pain, chronic cough and altered mental status.  Family rescinded hospice but remains DNR, wish to try medical management and short-term and reassess.  At time of discharge, they indicate that she cannot return home and will have to go to an SNF.  Admitted for dehydration, acute kidney injury, asymptomatic bacteriuria versus recurrent acute cystitis, syncope, acute metabolic encephalopathy.  Palliative care consulted, coordinated care with family, patient was transitioned to full comfort care on 10/18 and is currently awaiting residential hospice.   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 Creatinine normal in July.  Now presented with creatinine of 2.4. Suspect multifactorial secondary to dehydration, reported hypotension and possible ATN. Avoid nephrotoxics and hypotension. Did get a dose of IV Lasix in ED which probably has contributed as well. Treated supportively.  Palliative care team met with family on 10/18, patient was transition to full comfort care and is currently waiting for residential hospice bed.  Dehydration: Secondary to poor oral intake for several weeks. Resolved after brief IV fluids.  Patient reportedly eating better as per daughter at bedside. This is likely to be a recurrent problem.  Hypokalemia: Replaced.  Magnesium normal.  Asymptomatic bacteriuria versus recurrent cystitis Unable to differentiate due to poor history.  Abdominal pain present. Just completed Cipro 2 days PTA. Treated briefly with meropenem which was discontinued after transitioning to full comfort care.  Acute metabolic encephalopathy Multifactorial due to AKI, UTI complicating underlying moderate to severe dementia. Delirium precautions Per daughter, was agitated and thrashing all night.  Unclear why she did not get any as needed IV Haldol ordered per comfort care order set.  Will update nursing.  May need to consider scheduled medication late evening such as Seroquel as long as she is taking p.o.  Suspected syncope May be related to hypotension versus orthostatic hypotension TTE: LVEF 60-65%, grade 1 diastolic dysfunction and no aortic stenosis.  Hypotension Secondary to dehydration.?  Adrenal insufficiency.  BP on admission 101/48. Continue stress dose steroids given that patient is on  has pulled before. Psychiatry: Judgement and insight impaired. Mood & affect cannot be assessed at this time    Data Reviewed:   I have personally reviewed following labs and imaging studies   CBC: Recent Labs  Lab 04/13/23 1219 04/14/23 0602 04/15/23 1123  WBC 14.1* 12.9* 14.8*  NEUTROABS 10.5*  --   --   HGB 12.4 12.9 12.4  HCT 36.7 38.2 35.9*  MCV 96.8 99.5 100.0  PLT 172 153 149*    Basic Metabolic Panel: Recent Labs  Lab  04/13/23 1219 04/13/23 2150 04/13/23 2151 04/14/23 0602  NA  --  138  --  136  K  --  2.2*  --  3.3*  CL  --  74*  --  77*  CO2  --  >45*  --  38*  GLUCOSE  --  113*  --  159*  BUN  --  56*  --  54*  CREATININE  --  2.40* 2.48* 2.13*  CALCIUM  --  9.0  --  8.7*  MG 2.3  --   --   --     Liver Function Tests: Recent Labs  Lab 04/13/23 2150  AST 28  ALT 43  ALKPHOS 64  BILITOT 1.2  PROT 5.9*  ALBUMIN 3.0*    CBG: Recent Labs  Lab 04/14/23 2011 04/15/23 0802 04/15/23 1122  GLUCAP 180* 277* 317*    Microbiology Studies:  No results found for this or any previous visit (from the past 240 hour(s)).  Radiology Studies:  ECHOCARDIOGRAM COMPLETE  Result Date: 04/14/2023    ECHOCARDIOGRAM REPORT   Patient Name:   Kathy [name] Date of Exam: 04/14/2023 Medical Rec #:  [id number]  Height:       60.0 in Accession #:    [id number] Weight:       213.0 lb Date of Birth:  [birth year]  BSA:          1.917 m Patient Age:    82 years   BP:           112/56 mmHg Patient Gender: F          HR:           85 bpm. Exam Location:  Inpatient Procedure: 2D Echo, Cardiac Doppler and Color Doppler Indications:    R55 Syncope  History:        Patient has prior history of Echocardiogram examinations, most                 recent 08/18/2022. CHF, Abnormal ECG, Arrythmias:Atrial                 Fibrillation, Signs/Symptoms:Altered Mental Status, Syncope and                 Dyspnea; Risk Factors:Dyslipidemia, Diabetes and Hypertension.  Sonographer:    Sheralyn Boatman RDCS Referring Phys: [id number] RONDELL A SMITH  Sonographer Comments: Technically difficult study due to poor echo windows and patient is obese. Image acquisition challenging due to patient body habitus. Patient has altered mental status. Patient talking throughout exam. Attempted to turn. Could not do Definity, no IV access. Patient complained of pain from probe in apical region, could not appy appropriate pressure. IMPRESSIONS  1. Left ventricular ejection  fraction, by estimation, is 60 to 65%. The left ventricle has normal function. The left ventricle has no regional wall motion abnormalities. There is mild concentric left ventricular hypertrophy. Left ventricular diastolic parameters are consistent with Grade I diastolic dysfunction (impaired relaxation).  has pulled before. Psychiatry: Judgement and insight impaired. Mood & affect cannot be assessed at this time    Data Reviewed:   I have personally reviewed following labs and imaging studies   CBC: Recent Labs  Lab 04/13/23 1219 04/14/23 0602 04/15/23 1123  WBC 14.1* 12.9* 14.8*  NEUTROABS 10.5*  --   --   HGB 12.4 12.9 12.4  HCT 36.7 38.2 35.9*  MCV 96.8 99.5 100.0  PLT 172 153 149*    Basic Metabolic Panel: Recent Labs  Lab  04/13/23 1219 04/13/23 2150 04/13/23 2151 04/14/23 0602  NA  --  138  --  136  K  --  2.2*  --  3.3*  CL  --  74*  --  77*  CO2  --  >45*  --  38*  GLUCOSE  --  113*  --  159*  BUN  --  56*  --  54*  CREATININE  --  2.40* 2.48* 2.13*  CALCIUM  --  9.0  --  8.7*  MG 2.3  --   --   --     Liver Function Tests: Recent Labs  Lab 04/13/23 2150  AST 28  ALT 43  ALKPHOS 64  BILITOT 1.2  PROT 5.9*  ALBUMIN 3.0*    CBG: Recent Labs  Lab 04/14/23 2011 04/15/23 0802 04/15/23 1122  GLUCAP 180* 277* 317*    Microbiology Studies:  No results found for this or any previous visit (from the past 240 hour(s)).  Radiology Studies:  ECHOCARDIOGRAM COMPLETE  Result Date: 04/14/2023    ECHOCARDIOGRAM REPORT   Patient Name:   Kathy [name] Date of Exam: 04/14/2023 Medical Rec #:  [id number]  Height:       60.0 in Accession #:    [id number] Weight:       213.0 lb Date of Birth:  [birth year]  BSA:          1.917 m Patient Age:    82 years   BP:           112/56 mmHg Patient Gender: F          HR:           85 bpm. Exam Location:  Inpatient Procedure: 2D Echo, Cardiac Doppler and Color Doppler Indications:    R55 Syncope  History:        Patient has prior history of Echocardiogram examinations, most                 recent 08/18/2022. CHF, Abnormal ECG, Arrythmias:Atrial                 Fibrillation, Signs/Symptoms:Altered Mental Status, Syncope and                 Dyspnea; Risk Factors:Dyslipidemia, Diabetes and Hypertension.  Sonographer:    Sheralyn Boatman RDCS Referring Phys: [id number] RONDELL A SMITH  Sonographer Comments: Technically difficult study due to poor echo windows and patient is obese. Image acquisition challenging due to patient body habitus. Patient has altered mental status. Patient talking throughout exam. Attempted to turn. Could not do Definity, no IV access. Patient complained of pain from probe in apical region, could not appy appropriate pressure. IMPRESSIONS  1. Left ventricular ejection  fraction, by estimation, is 60 to 65%. The left ventricle has normal function. The left ventricle has no regional wall motion abnormalities. There is mild concentric left ventricular hypertrophy. Left ventricular diastolic parameters are consistent with Grade I diastolic dysfunction (impaired relaxation).

## 2023-04-17 DIAGNOSIS — R55 Syncope and collapse: Secondary | ICD-10-CM | POA: Diagnosis not present

## 2023-04-17 DIAGNOSIS — Z515 Encounter for palliative care: Secondary | ICD-10-CM | POA: Diagnosis not present

## 2023-04-17 DIAGNOSIS — N390 Urinary tract infection, site not specified: Secondary | ICD-10-CM | POA: Diagnosis not present

## 2023-04-17 DIAGNOSIS — Z7189 Other specified counseling: Secondary | ICD-10-CM | POA: Diagnosis not present

## 2023-04-17 NOTE — Plan of Care (Signed)
  Problem: Metabolic: Goal: Ability to maintain appropriate glucose levels will improve Outcome: Progressing   Problem: Nutritional: Goal: Progress toward achieving an optimal weight will improve Outcome: Progressing   Problem: Skin Integrity: Goal: Risk for impaired skin integrity will decrease Outcome: Progressing   Problem: Tissue Perfusion: Goal: Adequacy of tissue perfusion will improve Outcome: Progressing   Problem: Education: Goal: Knowledge of General Education information will improve Description: Including pain rating scale, medication(s)/side effects and non-pharmacologic comfort measures Outcome: Progressing   Problem: Health Behavior/Discharge Planning: Goal: Ability to manage health-related needs will improve Outcome: Progressing   Problem: Clinical Measurements: Goal: Ability to maintain clinical measurements within normal limits will improve Outcome: Progressing Goal: Will remain free from infection Outcome: Progressing Goal: Diagnostic test results will improve Outcome: Progressing Goal: Respiratory complications will improve Outcome: Progressing Goal: Cardiovascular complication will be avoided Outcome: Progressing   Problem: Coping: Goal: Level of anxiety will decrease Outcome: Progressing   Problem: Elimination: Goal: Will not experience complications related to bowel motility Outcome: Progressing Goal: Will not experience complications related to urinary retention Outcome: Progressing   Problem: Pain Managment: Goal: General experience of comfort will improve Outcome: Progressing   Problem: Safety: Goal: Ability to remain free from injury will improve Outcome: Progressing   Problem: Skin Integrity: Goal: Risk for impaired skin integrity will decrease Outcome: Progressing   Problem: Education: Goal: Knowledge of the prescribed therapeutic regimen will improve Outcome: Progressing   Problem: Coping: Goal: Ability to identify and  develop effective coping behavior will improve Outcome: Progressing   Problem: Clinical Measurements: Goal: Quality of life will improve Outcome: Progressing   Problem: Respiratory: Goal: Verbalizations of increased ease of respirations will increase Outcome: Progressing   Problem: Role Relationship: Goal: Family's ability to cope with current situation will improve Outcome: Progressing Goal: Ability to verbalize concerns, feelings, and thoughts to partner or family member will improve Outcome: Progressing   Problem: Pain Management: Goal: Satisfaction with pain management regimen will improve Outcome: Progressing

## 2023-04-17 NOTE — Progress Notes (Signed)
Daily Progress Note   Patient Name: Kathy [name]       Date: 04/17/2023 DOB: [birth year]  Age: 82 y.o. MRN#: [id number] Attending Physician: Elease Etienne, MD Primary Care Physician: Corwin Levins, MD Admit Date: 04/13/2023  Reason for Consultation/Follow-up: Non pain symptom management, Pain control, Psychosocial/spiritual support, and Terminal Care  Subjective: I have reviewed medical records including EPIC notes, MAR, and labs.   Went to visit patient at bedside - daughter/Donna present. Patient was lying in bed awake, alert, and disoriented. No signs or non-verbal gestures of pain or discomfort noted. No respiratory distress, increased work of breathing, or secretions noted. She is on 4L O2 [place].  Per Lupita Leash, patient ate "a little bit of breakfast." Emotional support provided to Lupita Leash. She confirms goal for residential hospice placement when bed available. She is aware at this time [facility] is not able to offer a bed.   All questions and concerns addressed. Encouraged to call with questions and/or concerns. PMT card provided.  Length of Stay: 3  Current Medications: Scheduled Meds:   amitriptyline  50 mg Oral QHS   antiseptic oral rinse  15 mL Topical TID   sertraline  25 mg Oral Daily   sodium chloride flush  3 mL Intravenous Q12H    Continuous Infusions:   PRN Meds: acetaminophen **OR** acetaminophen, albuterol, glycopyrrolate **OR** glycopyrrolate **OR** glycopyrrolate, haloperidol **OR** haloperidol **OR** haloperidol lactate, HYDROmorphone (DILAUDID) injection, LORazepam **OR** LORazepam, polyvinyl alcohol  Physical Exam Vitals and nursing note reviewed.  Constitutional:      General: She is not in acute distress.    Appearance: She is ill-appearing.  Pulmonary:      Effort: No respiratory distress.  Skin:    General: Skin is warm and dry.  Neurological:     Mental Status: She is alert. She is disoriented and confused.     Motor: Weakness present.             Vital Signs: BP 133/84 (BP Location: Left Arm)   Pulse (!) 102   Temp (!) 97.4 F (36.3 C) (Oral)   Resp 20   Ht 5' (1.524 m)   Wt 96.6 kg Comment: from 02/15/23  SpO2 95%   BMI 41.60 kg/m  SpO2: SpO2: 95 % O2 Device: O2 Device: Nasal Cannula O2 Flow Rate: O2 Flow Rate (  L/min): 4 L/min  Intake/output summary:  Intake/Output Summary (Last 24 hours) at 04/17/2023 0932 Last data filed at 04/17/2023 0500 Gross per 24 hour  Intake --  Output 400 ml  Net -400 ml   LBM: Last BM Date : 04/13/23 Baseline Weight: Weight: 96.6 kg (from 02/15/23) Most recent weight: Weight: 96.6 kg (from 02/15/23)       Palliative Assessment/Data: PPS 20%      Patient Active Problem List   Diagnosis Date Noted   Syncope and collapse 04/13/2023   Leukocytosis 04/13/2023   Hypotension 04/13/2023   Elevated troponin 04/13/2023   Dementia (HCC) 04/13/2023   Type 2 diabetes mellitus, without long-term current use of insulin (HCC) 04/13/2023   Pressure ulcer, sacrum 04/13/2023   Bilateral hearing loss due to cerumen impaction 12/30/2022   Memory loss 10/04/2022   Atrial fibrillation with RVR (HCC) 08/14/2022   CAP (community acquired pneumonia) 08/14/2022   Confusion 08/03/2022   Depression, psychotic (HCC) 08/03/2022   Rash 06/29/2022   Foul smelling urine 05/31/2022   Wheezing 05/31/2022   UTI due to extended-spectrum beta lactamase (ESBL) producing Escherichia coli 12/11/2021   AMS (altered mental status) 12/02/2021   AKI (acute kidney injury) (HCC) 12/02/2021   Acquired absence of other specified parts of digestive tract 12/01/2021   Metabolic encephalopathy 12/01/2021   Other polyuria 12/01/2021   Urinary frequency 09/22/2021   Peripheral neuropathy 03/23/2021   Pulmonary hypertension,  unspecified (HCC) 12/04/2020   Atrial fibrillation (HCC) 07/11/2020   HFrEF (heart failure with reduced ejection fraction) (HCC) 07/11/2020   Positive ANA (antinuclear antibody) 05/01/2020   Osteoarthritis 05/01/2020   Bilateral primary osteoarthritis of knee 05/01/2020   Aortic atherosclerosis (HCC) 04/18/2020   COVID-19 virus infection 07/31/2019   Personal history of COVID-19 06/29/2019   Bilateral knee pain 06/10/2019   Leg swelling 02/13/2019   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 (HCC) 02/13/2019   Physical deconditioning 02/13/2019   Vitamin D deficiency 12/04/2018   B12 deficiency 12/04/2018   Hyperglycemia 11/16/2018   Abdominal pain 06/02/2018   Acute on chronic kidney failure (HCC) 12/12/2017   Diastolic dysfunction 12/12/2017   Endometrial ca (HCC) 11/30/2017   GERD (gastroesophageal reflux disease) 11/30/2017   HLD (hyperlipidemia) 11/30/2017   CKD (chronic kidney disease) stage 3, GFR 30-59 ml/min (HCC) 11/30/2017   Dyspnea on exertion 11/07/2017   Cough 11/07/2017   ILD (interstitial lung disease) (HCC) 03/15/2017   Chronic respiratory failure with hypoxia (HCC) 03/15/2017   Oral herpes simplex infection    Influenza with pneumonia 06/28/2016   Community acquired pneumonia 06/27/2016   Essential hypertension 06/27/2016   Depression 06/27/2016   Sleep apnea 06/27/2016   Body mass index (BMI) 39.0-39.9, adult 06/29/1999   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) 06/29/1999   Hypertensive heart and chronic kidney disease with heart failure and stage 1 through stage 4 chronic kidney disease, or unspecified chronic kidney disease (HCC) 06/29/1999   Long term (current) use of anticoagulants 06/29/1999   Morbid (severe) obesity due to excess calories (HCC) 06/29/1999   Oxygen dependent 06/29/1999   Polyneuropathy in diseases classified elsewhere (HCC) 06/29/1999   Prediabetes 06/29/1999   Pure hypercholesterolemia, unspecified 06/29/1999   Umbilical  hernia without obstruction or gangrene 06/29/1999   Personal history of nicotine dependence 06/28/1996   Personal history 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 female with past medical history of moderate dementia, ambulatory dysfunction, PAF on  Eliquis, stage IIIa CKD, ILD, chronic respiratory failure with hypoxia on 2-4 L/min [place] oxygen, type II DM, and recurrent UTIs with resistant E. coli admitted on 04/13/2023 with several weeks of not eating or drinking much, syncope without seizure like activity, abdominal pain, chronic cough and altered mental status. Found to have AKI - creatinine 2.4, was normal in July. Suspected d/t dehydration, hypotension, and ?ATN. Patient was receiving hospice services at home prior to admission. PMT consulted to discuss GOC.   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)   Long term (current) use of anticoagulants   Morbid (severe) obesity due to excess calories (HCC)   Leukocytosis   Hypotension   Elevated troponin   Dementia (HCC)   Type 2 diabetes mellitus, without long-term current use of insulin (HCC)   Pressure ulcer, sacrum   Terminal care  Recommendations/Plan: Continue full comfort measures Continue DNR/DNI as previously documented  Transfer to [facility] residential hospice facility when bed available  Continue current comfort focused medication regimen - no changes PMT will continue to follow and support holistically   Symptom Management Dilaudid PRN pain/dyspnea/increased work of breathing/RR>25 Tylenol PRN pain/fever Biotin twice daily Robinul PRN  secretions Haldol PRN agitation/delirium Ativan PRN anxiety/seizure/sleep/distress Liquifilm Tears PRN dry eye Albuterol PRN wheezing/shortness of breath Continue amitriptyline and zoloft until no longer tolerating POs  Goals of Care and Additional Recommendations: Limitations on Scope of Treatment: Full Comfort Care  Code Status:    Code Status Orders  (From admission, onward)           Start     Ordered   04/15/23 1211  Do not attempt resuscitation (DNR) - Comfort care  Continuous       Question Answer Comment  If patient has no pulse and is not breathing Do Not Attempt Resuscitation   In Pre-Arrest Conditions (Patient Is Breathing and Has a Pulse) Provide comfort measures. Relieve any mechanical airway obstruction. Avoid transfer unless required for comfort.   Consent: Discussion documented in EHR or advanced directives reviewed      04/15/23 1211           Code Status History     Date Active Date Inactive Code Status Order ID Comments User Context   04/13/2023 1449 04/15/2023 1211 Do not attempt resuscitation (DNR) PRE-ARREST INTERVENTIONS DESIRED [id number]  Clydie Braun, MD ED   08/21/2022 1144 08/21/2022 1939 DNR [id number]  Zigmund Daniel., MD Inpatient   08/20/2022 1135 08/21/2022 1144 Full Code [id number]  Custovic, Rozell Searing, DO Inpatient   08/14/2022 0253 08/20/2022 1135 Full Code [id number]  Hillary Bow, DO ED   12/02/2021 0055 12/04/2021 1453 Full Code [id number]  Darlin Drop, DO ED   06/27/2016 2130 07/04/2016 1933 Full Code [id number]  Darreld Mclean, MD ED      Advance Directive Documentation    Flowsheet Row Most Recent Value  Type of Advance Directive Healthcare Power of Attorney, Living will  Pre-existing out of facility DNR order (yellow form or pink MOST form) --  "MOST" Form in Place? --       Prognosis:  < 2 weeks  Discharge Planning: Hospice facility  Care plan was discussed with patient's daughter  Thank you for  allowing the  Palliative Medicine Team to assist in the care of this patient.     Haskel Khan, NP  Please contact Palliative Medicine Team phone at [phone] for questions and concerns.   *Portions of this note are a verbal dictation therefore any spelling and/or grammatical errors are due to the "Dragon Medical One" system interpretation.

## 2023-04-17 NOTE — Progress Notes (Signed)
PROGRESS NOTE   Kathy [name]  ZOX:[id number]    DOB: 03-22-1941    DOA: 04/13/2023  PCP: Corwin Levins, MD   I have briefly reviewed patients previous medical records in [facility].  Chief Complaint  Patient presents with   Loss of Consciousness    Brief Hospital Course:  82 year old married female, lives with her spouse, was on home hospice PTA, at baseline with what appears to be moderate dementia, ambulatory dysfunction and has not ambulated in quite a while, mostly bedbound in the last 3 weeks, PMH of PAF on Eliquis, stage IIIa CKD, ILD, chronic respiratory failure with hypoxia on 2-4 L/min [place] oxygen, type II DM, recurrent UTIs with resistant E. coli, presented to the ED on 10/16 with several weeks of not eating or drinking much, syncope without seizure like activity, abdominal pain, chronic cough and altered mental status.  Family rescinded hospice but remains DNR, wish to try medical management and short-term and reassess.  At time of discharge, they indicate that she cannot return home and will have to go to an SNF.  Admitted for dehydration, acute kidney injury, asymptomatic bacteriuria versus recurrent acute cystitis, syncope, acute metabolic encephalopathy.  Palliative care consulted, coordinated care with family, patient was transitioned to full comfort care on 10/18 and is currently awaiting residential hospice.  Has been accepted to [facility] but no bed available yet.   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 Creatinine normal in July.  Now presented with creatinine of 2.4. Suspect multifactorial secondary to dehydration, reported hypotension and possible ATN. Avoid nephrotoxics and hypotension. Did get a dose of IV Lasix in ED which probably has contributed as well. Treated supportively.  Palliative care team met with family on 10/18, patient was transition to full comfort care and is currently waiting for residential hospice bed.  Dehydration: Secondary to poor oral intake for several weeks. Resolved after brief IV fluids.  Patient reportedly eating better as per daughter at bedside. This is likely to be a recurrent problem.  Hypokalemia: Replaced.  Magnesium normal.  Asymptomatic bacteriuria versus recurrent cystitis Unable to differentiate due to poor history.  Abdominal pain present. Just completed Cipro 2 days PTA. Treated briefly with meropenem which was discontinued after transitioning to full comfort care.  Acute metabolic encephalopathy Multifactorial due to AKI, UTI complicating underlying moderate to severe dementia. Delirium precautions Per daughter, did much better last night compared to the night prior and slept through the night.  Did get a dose of IV Haldol and Ativan yesterday morning.  Suspected syncope May be related to hypotension versus orthostatic hypotension TTE: LVEF 60-65%, grade 1 diastolic dysfunction and no aortic stenosis.  Hypotension Secondary to dehydration.?  Adrenal insufficiency.  BP on admission 101/48. Continue stress dose steroids given that patient is on chronic prednisone. Hold antihypertensives. Hypotension resolved.  All  meds nonessential to comfort were discontinued.  Elevated troponin Suspected due to demand ischemia in the absence of chest  pain. Follow echo-normal EF and no wall motion abnormalities.Marland Kitchen  PAF on chronic anticoagulation Continue Eliquis, dose adjustment per pharmacy All meds nonessential to comfort were discontinued.  Chronic HFrEF Although patient has all extremity edema, this appears to be third spacing.  She appears centrally volume depleted based on dry mucous membranes, hypotension, decreased urine output BNP 497, in the context of obesity TEE 2/24: LVEF 50-55% Got a dose of IV Lasix 20 mg x 1 in ED but that was likely related to her extremity edema and definitely did not help her AKI Hold off on diuretics and monitor.  ILD/chronic respiratory failure with hypoxia Continue supportive care Was on chronic prednisone 20 mg daily  Type II DM Hold oral hypoglycemics.  Currently on comfort meds only.  Dementia Suspect moderate if not severe. Delirium precautions  Depression Amitriptyline and sertraline  Adult failure to thrive/on home hospice PTA Overall poor prognosis and risk for recurrent decline. Had a long chat with patient's daughter-in-law and son via phone, they understand and actually felt that she should not have been brought to the hospital and should have remained home with hospice. As per discussion with palliative care team who had consult with family, family contemplating transitioning to full comfort care and residential hospice.  Body mass index is 41.6 kg/m./Morbid obesity   Pressure Ulcer: Pressure Injury 04/13/23 Buttocks Lower;Left (Active)  04/13/23 1708  Location: Buttocks  Location Orientation: Lower;Left  Staging:   Wound Description (Comments):   Present on Admission: Yes  Dressing Type Foam - Lift dressing to assess site every shift 04/17/23 0720    DVT prophylaxis:   On Eliquis anticoagulation   Code Status: Do not attempt resuscitation (DNR) - Comfort care:  Family Communication: Daughter at bedside daily Disposition:  Remains inpatient appropriate due to  ongoing IV fluids, IV antibiotics.     Consultants:   Palliative care medicine  Procedures:     Antimicrobials:   Meropenem   Subjective:  Awake, alert and oriented to only self but otherwise pleasantly confused.  Daughter and patient's spouse at bedside.  As per daughter, patient slept most of yesterday and last night.  Did well with breakfast this morning.  They are aware that we are currently waiting for residential hospice bed at [facility].  Objective:   Vitals:   04/15/23 1120 04/15/23 1205 04/16/23 0827 04/17/23 1200  BP: 119/63 126/77 133/84 (!) 114/91  Pulse: 91 89 (!) 102 66  Resp: 17 16 20 17   Temp: 98 F (36.7 C) (!) 97.5 F (36.4 C) (!) 97.4 F (36.3 C) 98 F (36.7 C)  TempSrc: Axillary Oral Oral Axillary  SpO2: 99% 100% 95% 95%  Weight:      Height:        General exam: Elderly female, moderately built and obese lying comfortably propped up in bed without distress. Respiratory system: Globally diminished breath sounds.  No increased work of breathing. Cardiovascular system: S1 & S2 heard, RRR. No JVD, murmurs, rubs, gallops or clicks.  Has all extremity mild nonpitting edema. Gastrointestinal system: Abdomen is nondistended.  Epigastric midline vertical scar with mild tenderness but no peritoneal signs.  No organomegaly or masses appreciated.  Bowel sounds heard. Central nervous system: Alert and oriented to self. No focal neurological deficits.   Skin: Extensive bruising of bilateral upper extremity and left leg.  Right forearm has dressing to protect IV  line which she has pulled before. Psychiatry: Judgement and insight impaired. Mood & affect pleasantly confused but not agitated.    Data Reviewed:   I have personally reviewed following labs and imaging studies   CBC: Recent Labs  Lab 04/13/23 1219 04/14/23 0602 04/15/23 1123  WBC 14.1* 12.9* 14.8*  NEUTROABS 10.5*  --   --   HGB 12.4 12.9 12.4  HCT 36.7 38.2 35.9*  MCV 96.8 99.5 100.0  PLT  172 153 149*    Basic Metabolic Panel: Recent Labs  Lab 04/13/23 1219 04/13/23 2150 04/13/23 2151 04/14/23 0602  NA  --  138  --  136  K  --  2.2*  --  3.3*  CL  --  74*  --  77*  CO2  --  >45*  --  38*  GLUCOSE  --  113*  --  159*  BUN  --  56*  --  54*  CREATININE  --  2.40* 2.48* 2.13*  CALCIUM  --  9.0  --  8.7*  MG 2.3  --   --   --     Liver Function Tests: Recent Labs  Lab 04/13/23 2150  AST 28  ALT 43  ALKPHOS 64  BILITOT 1.2  PROT 5.9*  ALBUMIN 3.0*    CBG: Recent Labs  Lab 04/14/23 2011 04/15/23 0802 04/15/23 1122  GLUCAP 180* 277* 317*    Microbiology Studies:  No results found for this or any previous visit (from the past 240 hour(s)).  Radiology Studies:  No results found.  Scheduled Meds:    amitriptyline  50 mg Oral QHS   antiseptic oral rinse  15 mL Topical TID   sertraline  25 mg Oral Daily   sodium chloride flush  3 mL Intravenous Q12H    Continuous Infusions:       LOS: 3 days     Marcellus Scott, MD,  FACP, [facility], [facility], [facility]   [facility] [place]      To contact the attending provider between 7A-7P or the covering provider during after hours 7P-7A, please log into the web site [email or web address] and access using universal [place] password for that web site. If you do not have the password, please call the hospital operator.  04/17/2023, 1:44 PM

## 2023-04-18 DIAGNOSIS — N390 Urinary tract infection, site not specified: Secondary | ICD-10-CM | POA: Diagnosis not present

## 2023-04-18 DIAGNOSIS — B9629 Other Escherichia coli [E. coli] as the cause of diseases classified elsewhere: Secondary | ICD-10-CM | POA: Diagnosis not present

## 2023-04-18 DIAGNOSIS — Z1612 Extended spectrum beta lactamase (ESBL) resistance: Secondary | ICD-10-CM | POA: Diagnosis not present

## 2023-04-18 DIAGNOSIS — R55 Syncope and collapse: Secondary | ICD-10-CM | POA: Diagnosis not present

## 2023-04-18 DIAGNOSIS — Z7189 Other specified counseling: Secondary | ICD-10-CM | POA: Diagnosis not present

## 2023-04-18 DIAGNOSIS — Z515 Encounter for palliative care: Secondary | ICD-10-CM | POA: Diagnosis not present

## 2023-04-18 MED ORDER — GLYCOPYRROLATE 0.2 MG/ML IJ SOLN: 0.2000 mg | INTRAMUSCULAR | Status: DC | PRN

## 2023-04-18 MED ORDER — LORAZEPAM 2 MG/ML PO CONC: 1.0000 mg | ORAL | Status: DC | PRN

## 2023-04-18 MED ORDER — HALOPERIDOL LACTATE 5 MG/ML IJ SOLN: 2.0000 mg | INTRAMUSCULAR | Status: DC | PRN

## 2023-04-18 MED ORDER — POLYVINYL ALCOHOL 1.4 % OP SOLN: 1.0000 [drp] | Freq: Four times a day (QID) | OPHTHALMIC | Status: DC | PRN

## 2023-04-18 MED ORDER — HYDROMORPHONE HCL 1 MG/ML IJ SOLN: 0.5000 mg | INTRAMUSCULAR | Status: DC | PRN

## 2023-04-18 NOTE — Progress Notes (Signed)
Daily Progress Note   Patient Name: Kathy [name]       Date: 04/18/2023 DOB: Jun 27, 1941  Age: 82 y.o. MRN#: [id number] Attending Physician: Elease Etienne, MD Primary Care Physician: Corwin Levins, MD Admit Date: 04/13/2023  Reason for Consultation/Follow-up: Non pain symptom management, Pain control, Psychosocial/spiritual support, and Terminal Care  Subjective: I have reviewed medical records including EPIC notes, MAR, and labs. Patient received 1 PRN dose of haldol and 1 PRN dose of ativan in the past 24 hours. Patient assessed at the bedside. She is resting comfortably on 4L [place], no signs of pain or distress. Several family members present visiting and share that she will be getting a bath soon.  Created space and opportunity for family's thoughts and feelings on patient's current illness. They notice that her most disturbing symptom is intermittent restlessness/agitation, as well as a wet-sounding congestion that has been present for months. This is intermittent as well. She had an instance of vomiting after eating when she could not clear her throat while coughing.  Counseled on availability of PRN medications for anxiety and excessive secretions, reviewing the option to schedule medications as well. Family is agreeable with continuing medications for the above concerns on an as-needed basis.  All questions and concerns addressed. Encouraged to call with questions and/or concerns. PMT card provided.  Length of Stay: 4  Physical Exam Vitals and nursing note reviewed.  Constitutional:      General: She is sleeping. She is not in acute distress.    Appearance: She is ill-appearing.  Cardiovascular:     Rate and Rhythm: Normal rate.  Pulmonary:     Effort: Pulmonary effort is normal. No  respiratory distress.  Skin:    General: Skin is warm and dry.  Neurological:     Motor: Weakness present.           Vital Signs: BP 135/77 (BP Location: Left Arm)   Pulse 77   Temp 98 F (36.7 C) (Oral)   Resp 17   Ht 5' (1.524 m)   Wt 96.6 kg Comment: from 02/15/23  SpO2 98%   BMI 41.60 kg/m  SpO2: SpO2: 98 % O2 Device: O2 Device: Nasal Cannula O2 Flow Rate: O2 Flow Rate (L/min): 4 L/min       Palliative Assessment/Data: PPS 20%    Palliative Care Assessment & Plan   Patient Profile: 82 y.o. female with past medical history of moderate dementia, ambulatory dysfunction, PAF on Eliquis, stage IIIa CKD, ILD, chronic respiratory failure with hypoxia on 2-4 L/min  oxygen, type II DM, and recurrent UTIs with resistant E. coli admitted on 04/13/2023 with several weeks of not eating or drinking much, syncope without seizure like activity, abdominal pain, chronic cough and altered mental status. Found to have AKI - creatinine 2.4, was normal in July. Suspected d/t dehydration, hypotension, and ?ATN. Patient was receiving hospice services at home prior to admission. PMT consulted to discuss GOC.   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)   Long term (current) use of anticoagulants   Morbid (severe) obesity due to excess calories (HCC)   Leukocytosis   Hypotension   Elevated troponin   Dementia (HCC)   Type 2 diabetes mellitus, without long-term current use of insulin (HCC)   Pressure ulcer, sacrum   Terminal care  Recommendations/Plan: Continue full comfort measures, no adjustments to medications required today Continue DNR/DNI  Patient remains stable for transfer to  [facility] residential hospice facility when bed available  PMT will continue to follow and support holistically   Symptom Management Dilaudid PRN pain/dyspnea/increased work of breathing/RR>25 Tylenol PRN pain/fever Biotin twice daily Robinul PRN secretions Haldol PRN agitation/delirium Ativan PRN anxiety/seizure/sleep/distress Liquifilm Tears PRN dry eye Albuterol PRN wheezing/shortness of breath Continue amitriptyline and zoloft until no longer tolerating POs  Goals of Care and Additional Recommendations: Limitations on Scope of Treatment: Full Comfort Care   Prognosis:  < 2 weeks  Discharge Planning: Hospice facility  Care plan was discussed with patient's family, nursing student   Richardson Dopp, [place] Palliative Medicine Team Team phone # [phone]  Thank you for allowing the Palliative Medicine Team to assist in the care of this patient. Please utilize secure chat with additional questions, if there is no response within 30 minutes please call the above phone number.  Palliative Medicine Team providers are available by phone from 7am to 7pm daily and can be reached through the team cell phone.  Should this patient require assistance outside of these hours, please call the patient's attending physician.  Portions of this note are a verbal dictation therefore any spelling and/or grammatical errors are due to the "Dragon Medical One" system interpretation.

## 2023-04-18 NOTE — Progress Notes (Signed)
Report called to [facility].

## 2023-04-18 NOTE — TOC Progression Note (Signed)
Transition of Care [facility]) - Progression Note    Patient Details  Name: Kathy [name] MRN: [id number] Date of Birth: 05-17-1941  Transition of Care [facility]) CM/SW Contact  Janae Bridgeman, RN Phone Number: 04/18/2023, 12:28 PM  Clinical Narrative:    CM spoke with Chilton Si, RNCM with [facility] and the facility has a bed available at [facility] home in [place] today.  Once consents are signed, patient can discharge to the facility by PTAR.  Medical necessity was completed and once consents are signed, transportation will be arranged.  DNR is in the chart.  Bedside nursing - please call report to [facility].   Expected Discharge Plan: Hospice Medical Facility Barriers to Discharge: Continued Medical Work up  Expected Discharge Plan and Services   Discharge Planning Services: CM Consult Post Acute Care Choice: Residential Hospice Bed Living arrangements for the past 2 months: Single Family Home Expected Discharge Date: 04/18/23                                     Social Determinants of Health (SDOH) Interventions SDOH Screenings   Food Insecurity: Patient Unable To Answer (04/13/2023)  Housing: High Risk (04/13/2023)  Transportation Needs: Patient Unable To Answer (04/13/2023)  Utilities: Patient Unable To Answer (04/13/2023)  Alcohol Screen: Low Risk  (02/15/2023)  Depression (PHQ2-9): Medium Risk (02/15/2023)  Financial Resource Strain: Low Risk  (02/15/2023)  Physical Activity: Inactive (02/15/2023)  Social Connections: Moderately Isolated (02/15/2023)  Stress: Stress Concern Present (02/15/2023)  Tobacco Use: Medium Risk (04/13/2023)  Health Literacy: Adequate Health Literacy (02/15/2023)    Readmission Risk Interventions    04/15/2023    1:31 PM  Readmission Risk Prevention Plan  Transportation Screening Complete  PCP or Specialist Appt within 3-5 Days Complete  HRI or Home Care Consult Complete  Social Work Consult for Recovery Care  Planning/Counseling Complete  Palliative Care Screening Complete  Medication Review [employer]) Complete

## 2023-04-18 NOTE — Plan of Care (Signed)
  Problem: Education: Goal: Ability to describe self-care measures that may prevent or decrease complications (Diabetes Survival Skills Education) will improve Outcome: Not Progressing Goal: Individualized Educational Video(s) Outcome: Not Progressing   Problem: Coping: Goal: Ability to adjust to condition or change in health will improve Outcome: Not Progressing   Problem: Fluid Volume: Goal: Ability to maintain a balanced intake and output will improve Outcome: Not Progressing   Problem: Health Behavior/Discharge Planning: Goal: Ability to identify and utilize available resources and services will improve Outcome: Not Progressing Goal: Ability to manage health-related needs will improve Outcome: Not Progressing   Problem: Metabolic: Goal: Ability to maintain appropriate glucose levels will improve Outcome: Not Progressing   Problem: Nutritional: Goal: Maintenance of adequate nutrition will improve Outcome: Not Progressing Goal: Progress toward achieving an optimal weight will improve Outcome: Not Progressing   Problem: Skin Integrity: Goal: Risk for impaired skin integrity will decrease Outcome: Not Progressing   Problem: Tissue Perfusion: Goal: Adequacy of tissue perfusion will improve Outcome: Not Progressing   Problem: Education: Goal: Knowledge of General Education information will improve Description: Including pain rating scale, medication(s)/side effects and non-pharmacologic comfort measures Outcome: Not Progressing   Problem: Health Behavior/Discharge Planning: Goal: Ability to manage health-related needs will improve Outcome: Not Progressing   Problem: Clinical Measurements: Goal: Ability to maintain clinical measurements within normal limits will improve Outcome: Not Progressing Goal: Will remain free from infection Outcome: Not Progressing Goal: Diagnostic test results will improve Outcome: Not Progressing Goal: Respiratory complications will  improve Outcome: Not Progressing Goal: Cardiovascular complication will be avoided Outcome: Not Progressing   Problem: Activity: Goal: Risk for activity intolerance will decrease Outcome: Not Progressing   Problem: Nutrition: Goal: Adequate nutrition will be maintained Outcome: Not Progressing   Problem: Coping: Goal: Level of anxiety will decrease Outcome: Not Progressing   Problem: Elimination: Goal: Will not experience complications related to bowel motility Outcome: Not Progressing Goal: Will not experience complications related to urinary retention Outcome: Not Progressing   Problem: Pain Managment: Goal: General experience of comfort will improve Outcome: Not Progressing   Problem: Safety: Goal: Ability to remain free from injury will improve Outcome: Not Progressing   Problem: Skin Integrity: Goal: Risk for impaired skin integrity will decrease Outcome: Not Progressing   Problem: Education: Goal: Knowledge of the prescribed therapeutic regimen will improve Outcome: Not Progressing   Problem: Coping: Goal: Ability to identify and develop effective coping behavior will improve Outcome: Not Progressing   Problem: Clinical Measurements: Goal: Quality of life will improve Outcome: Not Progressing   Problem: Respiratory: Goal: Verbalizations of increased ease of respirations will increase Outcome: Not Progressing   Problem: Role Relationship: Goal: Family's ability to cope with current situation will improve Outcome: Not Progressing Goal: Ability to verbalize concerns, feelings, and thoughts to partner or family member will improve Outcome: Not Progressing   Problem: Pain Management: Goal: Satisfaction with pain management regimen will improve Outcome: Not Progressing

## 2023-04-18 NOTE — Discharge Summary (Addendum)
Physician Discharge Summary  Kathy [name] BMW:[id number] DOB: 1940-08-18  PCP: Corwin Levins, MD  Admitted from: Home Discharged to: [facility]  Admit date: 04/13/2023 Discharge date: 04/18/2023  Recommendations for Outpatient Follow-up:    Follow-up Information     MD at [facility] Follow up.   Why: Follow-up regarding residential hospice care.        Corwin Levins, MD Follow up.   Specialties: Internal Medicine, Radiology Why: FYI. Contact information: [address] [place] [address] [phone]                  Home Health: None    Equipment/Devices: None    Discharge Condition: Poor prognosis, expected life expectancy <2 weeks.   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)   Elevated troponin   Atrial fibrillation (HCC)   Long term (current) use of anticoagulants   ILD (interstitial lung disease) (HCC)   Chronic respiratory failure with hypoxia (HCC)   Type 2 diabetes mellitus, without long-term current use of insulin (HCC)   Dementia (HCC)   Depression   Pressure ulcer, sacrum   Morbid (severe) obesity due to excess calories [facility])   Brief Summary: 82 year old married female, lives with her spouse, was on home hospice PTA, at baseline with what appears to be moderate dementia, ambulatory dysfunction and has not ambulated in quite a while, mostly bedbound in the last 3 weeks, PMH of PAF on Eliquis, stage IIIa CKD, ILD, chronic respiratory failure with hypoxia on 2-4 L/min [place] oxygen, type II DM, recurrent UTIs  with resistant E. coli, presented to the ED on 10/16 with several weeks of not eating or drinking much, syncope without seizure like activity, abdominal pain, chronic cough and altered mental status.  Family rescinded hospice but remains DNR, wished to try medical management and short-term and reassess.  Admitted for dehydration, acute kidney injury, asymptomatic bacteriuria versus recurrent acute cystitis, syncope, acute metabolic encephalopathy.  Palliative care consulted, coordinated care with family, patient was transitioned to full comfort care on 10/18 and is currently awaiting residential hospice.   Has been accepted to [facility] in [place].     Assessment & Plan:   Acute kidney injury complicating stage IIIa CKD, appears oliguric. Creatinine normal in July.  Now presented with creatinine of 2.4. Suspect multifactorial secondary to dehydration, reported hypotension and possible ATN. Did get a dose of IV Lasix in ED which probably has contributed as well. Treated supportively.  Palliative care team met with family on 10/18, patient was transitioned to full comfort care and is currently waiting for residential hospice bed.   Dehydration: Secondary to poor oral intake for several weeks. Resolved after brief IV fluids.  Patient reportedly eating better as per daughter at bedside. This is likely to be a recurrent problem.   Hypokalemia: Replaced.  Magnesium normal.   Asymptomatic bacteriuria versus recurrent cystitis Unable to differentiate due to poor history.  Abdominal pain present. Just completed Cipro 2  Physician Discharge Summary  Kathy [name] BMW:[id number] DOB: 1940-08-18  PCP: Corwin Levins, MD  Admitted from: Home Discharged to: [facility]  Admit date: 04/13/2023 Discharge date: 04/18/2023  Recommendations for Outpatient Follow-up:    Follow-up Information     MD at [facility] Follow up.   Why: Follow-up regarding residential hospice care.        Corwin Levins, MD Follow up.   Specialties: Internal Medicine, Radiology Why: FYI. Contact information: [address] [place] [address] [phone]                  Home Health: None    Equipment/Devices: None    Discharge Condition: Poor prognosis, expected life expectancy <2 weeks.   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)   Elevated troponin   Atrial fibrillation (HCC)   Long term (current) use of anticoagulants   ILD (interstitial lung disease) (HCC)   Chronic respiratory failure with hypoxia (HCC)   Type 2 diabetes mellitus, without long-term current use of insulin (HCC)   Dementia (HCC)   Depression   Pressure ulcer, sacrum   Morbid (severe) obesity due to excess calories [facility])   Brief Summary: 82 year old married female, lives with her spouse, was on home hospice PTA, at baseline with what appears to be moderate dementia, ambulatory dysfunction and has not ambulated in quite a while, mostly bedbound in the last 3 weeks, PMH of PAF on Eliquis, stage IIIa CKD, ILD, chronic respiratory failure with hypoxia on 2-4 L/min [place] oxygen, type II DM, recurrent UTIs  with resistant E. coli, presented to the ED on 10/16 with several weeks of not eating or drinking much, syncope without seizure like activity, abdominal pain, chronic cough and altered mental status.  Family rescinded hospice but remains DNR, wished to try medical management and short-term and reassess.  Admitted for dehydration, acute kidney injury, asymptomatic bacteriuria versus recurrent acute cystitis, syncope, acute metabolic encephalopathy.  Palliative care consulted, coordinated care with family, patient was transitioned to full comfort care on 10/18 and is currently awaiting residential hospice.   Has been accepted to [facility] in [place].     Assessment & Plan:   Acute kidney injury complicating stage IIIa CKD, appears oliguric. Creatinine normal in July.  Now presented with creatinine of 2.4. Suspect multifactorial secondary to dehydration, reported hypotension and possible ATN. Did get a dose of IV Lasix in ED which probably has contributed as well. Treated supportively.  Palliative care team met with family on 10/18, patient was transitioned to full comfort care and is currently waiting for residential hospice bed.   Dehydration: Secondary to poor oral intake for several weeks. Resolved after brief IV fluids.  Patient reportedly eating better as per daughter at bedside. This is likely to be a recurrent problem.   Hypokalemia: Replaced.  Magnesium normal.   Asymptomatic bacteriuria versus recurrent cystitis Unable to differentiate due to poor history.  Abdominal pain present. Just completed Cipro 2  Physician Discharge Summary  Kathy [name] BMW:[id number] DOB: 1940-08-18  PCP: Corwin Levins, MD  Admitted from: Home Discharged to: [facility]  Admit date: 04/13/2023 Discharge date: 04/18/2023  Recommendations for Outpatient Follow-up:    Follow-up Information     MD at [facility] Follow up.   Why: Follow-up regarding residential hospice care.        Corwin Levins, MD Follow up.   Specialties: Internal Medicine, Radiology Why: FYI. Contact information: [address] [place] [address] [phone]                  Home Health: None    Equipment/Devices: None    Discharge Condition: Poor prognosis, expected life expectancy <2 weeks.   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)   Elevated troponin   Atrial fibrillation (HCC)   Long term (current) use of anticoagulants   ILD (interstitial lung disease) (HCC)   Chronic respiratory failure with hypoxia (HCC)   Type 2 diabetes mellitus, without long-term current use of insulin (HCC)   Dementia (HCC)   Depression   Pressure ulcer, sacrum   Morbid (severe) obesity due to excess calories [facility])   Brief Summary: 82 year old married female, lives with her spouse, was on home hospice PTA, at baseline with what appears to be moderate dementia, ambulatory dysfunction and has not ambulated in quite a while, mostly bedbound in the last 3 weeks, PMH of PAF on Eliquis, stage IIIa CKD, ILD, chronic respiratory failure with hypoxia on 2-4 L/min [place] oxygen, type II DM, recurrent UTIs  with resistant E. coli, presented to the ED on 10/16 with several weeks of not eating or drinking much, syncope without seizure like activity, abdominal pain, chronic cough and altered mental status.  Family rescinded hospice but remains DNR, wished to try medical management and short-term and reassess.  Admitted for dehydration, acute kidney injury, asymptomatic bacteriuria versus recurrent acute cystitis, syncope, acute metabolic encephalopathy.  Palliative care consulted, coordinated care with family, patient was transitioned to full comfort care on 10/18 and is currently awaiting residential hospice.   Has been accepted to [facility] in [place].     Assessment & Plan:   Acute kidney injury complicating stage IIIa CKD, appears oliguric. Creatinine normal in July.  Now presented with creatinine of 2.4. Suspect multifactorial secondary to dehydration, reported hypotension and possible ATN. Did get a dose of IV Lasix in ED which probably has contributed as well. Treated supportively.  Palliative care team met with family on 10/18, patient was transitioned to full comfort care and is currently waiting for residential hospice bed.   Dehydration: Secondary to poor oral intake for several weeks. Resolved after brief IV fluids.  Patient reportedly eating better as per daughter at bedside. This is likely to be a recurrent problem.   Hypokalemia: Replaced.  Magnesium normal.   Asymptomatic bacteriuria versus recurrent cystitis Unable to differentiate due to poor history.  Abdominal pain present. Just completed Cipro 2  Lasix 20 MG tablet Generic drug: furosemide   pantoprazole 40 MG tablet Commonly known as: PROTONIX   predniSONE 20 MG tablet Commonly known as: DELTASONE   sotalol 80 MG tablet Commonly known as: BETAPACE   triamcinolone cream 0.5 % Commonly known as: KENALOG   Vitamin D-3 25 MCG (1000 UT) Caps       TAKE these medications    amitriptyline 100 MG tablet Commonly known as: ELAVIL Take 50 mg by mouth at bedtime. What changed: Another medication with the same name was removed. Continue taking this medication, and follow the directions you see here.   glycopyrrolate 0.2 MG/ML injection Commonly known as: ROBINUL Inject 1 mL (0.2 mg total) into the vein every 4 (four) hours as needed (excessive secretions).   haloperidol lactate 5 MG/ML injection Commonly known as: HALDOL Inject 0.4 mLs (2 mg total) into the vein every 4 (four) hours as needed (Agitation or  delirium).   HYDROmorphone 1 MG/ML injection Commonly known as: DILAUDID Inject 0.5 mLs (0.5 mg total) into the vein every 3 (three) hours as needed for severe pain (pain score 7-10) (To alleviate signs and symptoms of distress).   LORazepam 2 MG/ML concentrated solution Commonly known as: ATIVAN Place 0.5 mLs (1 mg total) under the tongue every 4 (four) hours as needed for anxiety.   polyvinyl alcohol 1.4 % ophthalmic solution Commonly known as: LIQUIFILM TEARS Place 1 drop into both eyes 4 (four) times daily as needed for dry eyes.   sertraline 25 MG tablet Commonly known as: ZOLOFT TAKE 1 TABLET (25 MG TOTAL) BY MOUTH DAILY.       Allergies  Allergen Reactions   Lipitor [Atorvastatin] Other (See Comments)    Memory issues   Requip [Ropinirole Hcl] Other (See Comments)    Pt reports feeling generally unwell on this medication      Procedures/Studies: ECHOCARDIOGRAM COMPLETE  Result Date: 04/14/2023    ECHOCARDIOGRAM REPORT   Patient Name:   JUNEROSE ANDREASON Date of Exam: 04/14/2023 Medical Rec #:  [id number]  Height:       60.0 in Accession #:    [id number] Weight:       213.0 lb Date of Birth:  [birth year]  BSA:          1.917 m Patient Age:    82 years   BP:           112/56 mmHg Patient Gender: F          HR:           85 bpm. Exam Location:  Inpatient Procedure: 2D Echo, Cardiac Doppler and Color Doppler Indications:    R55 Syncope  History:        Patient has prior history of Echocardiogram examinations, most                 recent 08/18/2022. CHF, Abnormal ECG, Arrythmias:Atrial                 Fibrillation, Signs/Symptoms:Altered Mental Status, Syncope and                 Dyspnea; Risk Factors:Dyslipidemia, Diabetes and Hypertension.  Sonographer:    Sheralyn Boatman RDCS Referring Phys: [id number] RONDELL A SMITH  Sonographer Comments: Technically difficult study due to poor echo windows and patient is obese. Image acquisition challenging due to patient body habitus. Patient has altered  mental status. Patient talking throughout exam. Attempted to turn. Could not do Definity, no IV access. Patient complained of  Lasix 20 MG tablet Generic drug: furosemide   pantoprazole 40 MG tablet Commonly known as: PROTONIX   predniSONE 20 MG tablet Commonly known as: DELTASONE   sotalol 80 MG tablet Commonly known as: BETAPACE   triamcinolone cream 0.5 % Commonly known as: KENALOG   Vitamin D-3 25 MCG (1000 UT) Caps       TAKE these medications    amitriptyline 100 MG tablet Commonly known as: ELAVIL Take 50 mg by mouth at bedtime. What changed: Another medication with the same name was removed. Continue taking this medication, and follow the directions you see here.   glycopyrrolate 0.2 MG/ML injection Commonly known as: ROBINUL Inject 1 mL (0.2 mg total) into the vein every 4 (four) hours as needed (excessive secretions).   haloperidol lactate 5 MG/ML injection Commonly known as: HALDOL Inject 0.4 mLs (2 mg total) into the vein every 4 (four) hours as needed (Agitation or  delirium).   HYDROmorphone 1 MG/ML injection Commonly known as: DILAUDID Inject 0.5 mLs (0.5 mg total) into the vein every 3 (three) hours as needed for severe pain (pain score 7-10) (To alleviate signs and symptoms of distress).   LORazepam 2 MG/ML concentrated solution Commonly known as: ATIVAN Place 0.5 mLs (1 mg total) under the tongue every 4 (four) hours as needed for anxiety.   polyvinyl alcohol 1.4 % ophthalmic solution Commonly known as: LIQUIFILM TEARS Place 1 drop into both eyes 4 (four) times daily as needed for dry eyes.   sertraline 25 MG tablet Commonly known as: ZOLOFT TAKE 1 TABLET (25 MG TOTAL) BY MOUTH DAILY.       Allergies  Allergen Reactions   Lipitor [Atorvastatin] Other (See Comments)    Memory issues   Requip [Ropinirole Hcl] Other (See Comments)    Pt reports feeling generally unwell on this medication      Procedures/Studies: ECHOCARDIOGRAM COMPLETE  Result Date: 04/14/2023    ECHOCARDIOGRAM REPORT   Patient Name:   JUNEROSE ANDREASON Date of Exam: 04/14/2023 Medical Rec #:  [id number]  Height:       60.0 in Accession #:    [id number] Weight:       213.0 lb Date of Birth:  [birth year]  BSA:          1.917 m Patient Age:    82 years   BP:           112/56 mmHg Patient Gender: F          HR:           85 bpm. Exam Location:  Inpatient Procedure: 2D Echo, Cardiac Doppler and Color Doppler Indications:    R55 Syncope  History:        Patient has prior history of Echocardiogram examinations, most                 recent 08/18/2022. CHF, Abnormal ECG, Arrythmias:Atrial                 Fibrillation, Signs/Symptoms:Altered Mental Status, Syncope and                 Dyspnea; Risk Factors:Dyslipidemia, Diabetes and Hypertension.  Sonographer:    Sheralyn Boatman RDCS Referring Phys: [id number] RONDELL A SMITH  Sonographer Comments: Technically difficult study due to poor echo windows and patient is obese. Image acquisition challenging due to patient body habitus. Patient has altered  mental status. Patient talking throughout exam. Attempted to turn. Could not do Definity, no IV access. Patient complained of  Physician Discharge Summary  Kathy [name] BMW:[id number] DOB: 1940-08-18  PCP: Corwin Levins, MD  Admitted from: Home Discharged to: [facility]  Admit date: 04/13/2023 Discharge date: 04/18/2023  Recommendations for Outpatient Follow-up:    Follow-up Information     MD at [facility] Follow up.   Why: Follow-up regarding residential hospice care.        Corwin Levins, MD Follow up.   Specialties: Internal Medicine, Radiology Why: FYI. Contact information: [address] [place] [address] [phone]                  Home Health: None    Equipment/Devices: None    Discharge Condition: Poor prognosis, expected life expectancy <2 weeks.   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)   Elevated troponin   Atrial fibrillation (HCC)   Long term (current) use of anticoagulants   ILD (interstitial lung disease) (HCC)   Chronic respiratory failure with hypoxia (HCC)   Type 2 diabetes mellitus, without long-term current use of insulin (HCC)   Dementia (HCC)   Depression   Pressure ulcer, sacrum   Morbid (severe) obesity due to excess calories [facility])   Brief Summary: 82 year old married female, lives with her spouse, was on home hospice PTA, at baseline with what appears to be moderate dementia, ambulatory dysfunction and has not ambulated in quite a while, mostly bedbound in the last 3 weeks, PMH of PAF on Eliquis, stage IIIa CKD, ILD, chronic respiratory failure with hypoxia on 2-4 L/min [place] oxygen, type II DM, recurrent UTIs  with resistant E. coli, presented to the ED on 10/16 with several weeks of not eating or drinking much, syncope without seizure like activity, abdominal pain, chronic cough and altered mental status.  Family rescinded hospice but remains DNR, wished to try medical management and short-term and reassess.  Admitted for dehydration, acute kidney injury, asymptomatic bacteriuria versus recurrent acute cystitis, syncope, acute metabolic encephalopathy.  Palliative care consulted, coordinated care with family, patient was transitioned to full comfort care on 10/18 and is currently awaiting residential hospice.   Has been accepted to [facility] in [place].     Assessment & Plan:   Acute kidney injury complicating stage IIIa CKD, appears oliguric. Creatinine normal in July.  Now presented with creatinine of 2.4. Suspect multifactorial secondary to dehydration, reported hypotension and possible ATN. Did get a dose of IV Lasix in ED which probably has contributed as well. Treated supportively.  Palliative care team met with family on 10/18, patient was transitioned to full comfort care and is currently waiting for residential hospice bed.   Dehydration: Secondary to poor oral intake for several weeks. Resolved after brief IV fluids.  Patient reportedly eating better as per daughter at bedside. This is likely to be a recurrent problem.   Hypokalemia: Replaced.  Magnesium normal.   Asymptomatic bacteriuria versus recurrent cystitis Unable to differentiate due to poor history.  Abdominal pain present. Just completed Cipro 2

## 2023-04-29 DEATH — deceased

## 2023-05-18 ENCOUNTER — Ambulatory Visit: Payer: Medicare Other | Admitting: Neurology

## 2023-05-19 ENCOUNTER — Ambulatory Visit: Payer: Medicare Other | Admitting: Neurology

## 2024-01-06 ENCOUNTER — Ambulatory Visit: Payer: Self-pay | Admitting: Cardiology
